# Patient Record
Sex: Female | Born: 1980 | Race: Black or African American | Hispanic: No | Marital: Single | Smoking: Former smoker
Health system: Southern US, Community
[De-identification: ages and names within clinical notes are randomized; demographics above are authoritative.]

## PROBLEM LIST (undated history)

## (undated) DIAGNOSIS — Z87442 Personal history of urinary calculi: Secondary | ICD-10-CM

## (undated) DIAGNOSIS — N83209 Unspecified ovarian cyst, unspecified side: Secondary | ICD-10-CM

## (undated) DIAGNOSIS — J45909 Unspecified asthma, uncomplicated: Secondary | ICD-10-CM

## (undated) DIAGNOSIS — N289 Disorder of kidney and ureter, unspecified: Secondary | ICD-10-CM

## (undated) DIAGNOSIS — M71022 Abscess of bursa, left elbow: Secondary | ICD-10-CM

## (undated) DIAGNOSIS — G839 Paralytic syndrome, unspecified: Secondary | ICD-10-CM

## (undated) DIAGNOSIS — D649 Anemia, unspecified: Secondary | ICD-10-CM

## (undated) DIAGNOSIS — M1990 Unspecified osteoarthritis, unspecified site: Secondary | ICD-10-CM

## (undated) DIAGNOSIS — F209 Schizophrenia, unspecified: Secondary | ICD-10-CM

## (undated) DIAGNOSIS — I10 Essential (primary) hypertension: Secondary | ICD-10-CM

## (undated) DIAGNOSIS — F32A Depression, unspecified: Secondary | ICD-10-CM

## (undated) DIAGNOSIS — Z9289 Personal history of other medical treatment: Secondary | ICD-10-CM

## (undated) DIAGNOSIS — F329 Major depressive disorder, single episode, unspecified: Secondary | ICD-10-CM

## (undated) DIAGNOSIS — E119 Type 2 diabetes mellitus without complications: Secondary | ICD-10-CM

## (undated) DIAGNOSIS — D571 Sickle-cell disease without crisis: Secondary | ICD-10-CM

## (undated) HISTORY — PX: DILATION AND CURETTAGE OF UTERUS: SHX78

## (undated) HISTORY — PX: CARPAL TUNNEL RELEASE: SHX101

---
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
## 2011-12-01 HISTORY — PX: APPENDECTOMY: SHX54

## 2013-06-30 DIAGNOSIS — M71022 Abscess of bursa, left elbow: Secondary | ICD-10-CM

## 2013-06-30 HISTORY — DX: Abscess of bursa, left elbow: M71.022

## 2013-09-05 ENCOUNTER — Encounter (HOSPITAL_COMMUNITY): Payer: Self-pay

## 2013-09-05 ENCOUNTER — Emergency Department (HOSPITAL_COMMUNITY): Payer: Medicaid Other

## 2013-09-05 ENCOUNTER — Emergency Department (HOSPITAL_COMMUNITY)
Admission: EM | Admit: 2013-09-05 | Discharge: 2013-09-05 | Disposition: A | Discharge status: Home / Self Care | Payer: Medicaid Other | Attending: Emergency Medicine | Admitting: Emergency Medicine

## 2013-09-05 DIAGNOSIS — Z3202 Encounter for pregnancy test, result negative: Secondary | ICD-10-CM | POA: Insufficient documentation

## 2013-09-05 DIAGNOSIS — E119 Type 2 diabetes mellitus without complications: Secondary | ICD-10-CM | POA: Insufficient documentation

## 2013-09-05 DIAGNOSIS — Z79899 Other long term (current) drug therapy: Secondary | ICD-10-CM | POA: Insufficient documentation

## 2013-09-05 DIAGNOSIS — Z794 Long term (current) use of insulin: Secondary | ICD-10-CM | POA: Insufficient documentation

## 2013-09-05 DIAGNOSIS — I10 Essential (primary) hypertension: Secondary | ICD-10-CM | POA: Insufficient documentation

## 2013-09-05 DIAGNOSIS — D5700 Hb-SS disease with crisis, unspecified: Secondary | ICD-10-CM | POA: Insufficient documentation

## 2013-09-05 DIAGNOSIS — Z880 Allergy status to penicillin: Secondary | ICD-10-CM | POA: Insufficient documentation

## 2013-09-05 HISTORY — DX: Essential (primary) hypertension: I10

## 2013-09-05 LAB — HEPATIC FUNCTION PANEL
ALT: 11 U/L (ref 0–35)
AST: 12 U/L (ref 0–37)
Albumin: 2.6 g/dL — ABNORMAL LOW (ref 3.5–5.2)
Alkaline Phosphatase: 86 U/L (ref 39–117)
Bilirubin, Direct: <0.1 mg/dL (ref 0.0–0.3)
Total Bilirubin: 0.3 mg/dL (ref 0.3–1.2)
Total Protein: 7.3 g/dL (ref 6.0–8.3)

## 2013-09-05 LAB — CBC WITH DIFFERENTIAL/PLATELET
Basophils Absolute: 0 10*3/uL (ref 0.0–0.1)
Basophils Relative: 0 % (ref 0–1)
Eosinophils Absolute: 0.1 10*3/uL (ref 0.0–0.7)
Eosinophils Relative: 2 % (ref 0–5)
HCT: 35.5 % — ABNORMAL LOW (ref 36.0–46.0)
Hemoglobin: 11.5 g/dL — ABNORMAL LOW (ref 12.0–15.0)
Lymphocytes Relative: 45 % (ref 12–46)
Lymphs Abs: 3 10*3/uL (ref 0.7–4.0)
MCH: 22.9 pg — ABNORMAL LOW (ref 26.0–34.0)
MCHC: 32.4 g/dL (ref 30.0–36.0)
MCV: 70.6 fL — ABNORMAL LOW (ref 78.0–100.0)
Monocytes Absolute: 0.3 10*3/uL (ref 0.1–1.0)
Monocytes Relative: 5 % (ref 3–12)
Neutro Abs: 3.2 10*3/uL (ref 1.7–7.7)
Neutrophils Relative %: 48 % (ref 43–77)
Platelets: 354 10*3/uL (ref 150–400)
RBC: 5.03 MIL/uL (ref 3.87–5.11)
RDW: 16.3 % — ABNORMAL HIGH (ref 11.5–15.5)
WBC: 6.7 10*3/uL (ref 4.0–10.5)

## 2013-09-05 LAB — BASIC METABOLIC PANEL
BUN: 8 mg/dL (ref 6–23)
CO2: 24 mEq/L (ref 19–32)
Calcium: 8.9 mg/dL (ref 8.4–10.5)
Chloride: 102 mEq/L (ref 96–112)
Creatinine, Ser: 0.56 mg/dL (ref 0.50–1.10)
GFR calc Af Amer: >90 mL/min (ref >90)
GFR calc non Af Amer: >90 mL/min (ref >90)
Glucose, Bld: 145 mg/dL — ABNORMAL HIGH (ref 70–99)
Potassium: 3.9 mEq/L (ref 3.5–5.1)
Sodium: 137 mEq/L (ref 135–145)

## 2013-09-05 LAB — RETICULOCYTES
RBC.: 5.03 MIL/uL (ref 3.87–5.11)
Retic Count, Absolute: 55.3 10*3/uL (ref 19.0–186.0)
Retic Ct Pct: 1.1 % (ref 0.4–3.1)

## 2013-09-05 LAB — URINALYSIS, ROUTINE W REFLEX MICROSCOPIC
Bilirubin Urine: NEGATIVE
Glucose, UA: NEGATIVE mg/dL
Ketones, ur: NEGATIVE mg/dL
Leukocytes, UA: NEGATIVE
Nitrite: NEGATIVE
Protein, ur: 100 mg/dL — AB
Specific Gravity, Urine: 1.016 (ref 1.005–1.030)
Urobilinogen, UA: 0.2 mg/dL (ref 0.0–1.0)
pH: 7 (ref 5.0–8.0)

## 2013-09-05 LAB — POCT PREGNANCY, URINE: Preg Test, Ur: NEGATIVE

## 2013-09-05 LAB — URINE MICROSCOPIC-ADD ON

## 2013-09-05 MED ORDER — ONDANSETRON 4 MG PO TBDP
4.0000 mg | ORAL_TABLET | Freq: Once | ORAL | Status: AC
  Administered 2013-09-05: 4 mg via ORAL
  Filled 2013-09-05: qty 1

## 2013-09-05 MED ORDER — HYDROMORPHONE HCL PF 2 MG/ML IJ SOLN
2.0000 mg | Freq: Once | INTRAMUSCULAR | Status: AC
  Administered 2013-09-05: 2 mg via INTRAVENOUS
  Filled 2013-09-05: qty 1

## 2013-09-05 MED ORDER — ONDANSETRON HCL 4 MG PO TABS: 4.0000 mg | ORAL_TABLET | Freq: Four times a day (QID) | ORAL | Status: DC

## 2013-09-05 MED ORDER — FAMOTIDINE IN NACL 20-0.9 MG/50ML-% IV SOLN
20.0000 mg | Freq: Once | INTRAVENOUS | Status: AC
  Administered 2013-09-05: 20 mg via INTRAVENOUS
  Filled 2013-09-05: qty 50

## 2013-09-05 MED ORDER — SODIUM CHLORIDE 0.9 % IV BOLUS (SEPSIS)
1000.0000 mL | Freq: Once | INTRAVENOUS | Status: AC
  Administered 2013-09-05: 1000 mL via INTRAVENOUS

## 2013-09-05 MED ORDER — MORPHINE SULFATE 4 MG/ML IJ SOLN
4.0000 mg | Freq: Once | INTRAMUSCULAR | Status: AC
  Administered 2013-09-05: 4 mg via INTRAVENOUS
  Filled 2013-09-05: qty 1

## 2013-09-05 MED ORDER — KETOROLAC TROMETHAMINE 30 MG/ML IJ SOLN
30.0000 mg | Freq: Once | INTRAMUSCULAR | Status: AC
  Administered 2013-09-05: 30 mg via INTRAVENOUS
  Filled 2013-09-05: qty 1

## 2013-09-05 MED ORDER — OXYCODONE-ACETAMINOPHEN 5-325 MG PO TABS: 1.0000 | ORAL_TABLET | Freq: Four times a day (QID) | ORAL | Status: DC | PRN

## 2013-09-05 MED ORDER — PROMETHAZINE HCL 25 MG/ML IJ SOLN
12.5000 mg | Freq: Once | INTRAMUSCULAR | Status: AC
  Administered 2013-09-05: 12.5 mg via INTRAVENOUS
  Filled 2013-09-05: qty 1

## 2013-09-05 NOTE — ED Notes (Signed)
Patient transported to X-ray 

## 2013-09-05 NOTE — ED Notes (Signed)
Pt alert and oriented. Pt placed on monitor. O2 via nasal cannula for comfort. O2 sats 99%

## 2013-09-05 NOTE — ED Notes (Signed)
Pt states she feels nauseous. Pt ambulatory to BR with steady gait.

## 2013-09-05 NOTE — ED Notes (Signed)
Pt given ginger ale. Pt sitting up talking on phone.

## 2013-09-05 NOTE — ED Provider Notes (Signed)
CSN: [id number]     Arrival date & time 09/05/13  1020 History   First MD Initiated Contact with Patient 09/05/13 1030     Chief Complaint  Patient presents with  . Emesis   (Consider location/radiation/quality/duration/timing/severity/associated sxs/prior Treatment) HPI  Cynthia Rosales is a 32 y.o. female with PMH significant for IDDM, HTN and sickle cell c/o extremity x4 and low back pain consistent with prior sickle cell pain. Pt denies fever, CP, cough, SOB, N/V, abd pain, HA, dysarthria,ataxia.   Pt moved her from [place] x4 weeks ago and has not established primary care. She was inpatient at [facility] in [place] x5 weeks ago .  Past Medical History  Diagnosis Date  . Diabetes mellitus without complication   . Hypertension   . Sickle cell disease    History reviewed. No pertinent past surgical history. No family history on file. History  Substance Use Topics  . Smoking status: Not on file  . Smokeless tobacco: Not on file  . Alcohol Use: Not on file   OB History   Grav Para Term Preterm Abortions TAB SAB Ect Mult Living                 Review of Systems 10 systems reviewed and found to be negative, except as noted in the HPI  Allergies  Amoxicillin  Home Medications   Current Outpatient Rx  Name  Route  Sig  Dispense  Refill  . insulin aspart (NOVOLOG) 100 UNIT/ML injection   Subcutaneous   Inject 10-15 Units into the skin 3 (three) times daily with meals. BS 100-150=10 units, if over 150=15 units         . insulin glargine (LANTUS) 100 UNIT/ML injection   Subcutaneous   Inject 40 Units into the skin at bedtime.         . metFORMIN (GLUCOPHAGE) 850 MG tablet   Oral   Take 850 mg by mouth 2 (two) times daily with a meal.         . Norelgestromin-Eth Estradiol (ORTHO EVRA TD)   Transdermal   Place 1 patch onto the skin every 7 (seven) weeks. Pt does every week for 3 weeks then has a off week         . oxyCODONE (OXY IR/ROXICODONE) 5 MG immediate  release tablet   Oral   Take 5-10 mg by mouth every 6 (six) hours as needed for pain.          BP 166/98  Pulse 78  Temp(Src) 98.2 F (36.8 C) (Oral)  Resp 18  SpO2 100% Physical Exam  Nursing note and vitals reviewed. Constitutional: She is oriented to person, place, and time. She appears well-developed and well-nourished. No distress.  Obese  HENT:  Head: Normocephalic.  No Jaundice or pale conjuctiva  Eyes: Conjunctivae and EOM are normal. Pupils are equal, round, and reactive to light.  Neck: Normal range of motion. Neck supple.  Cardiovascular: Normal rate, regular rhythm and intact distal pulses.   Pulmonary/Chest: Effort normal and breath sounds normal. No stridor. No respiratory distress. She has no wheezes. She has no rales. She exhibits no tenderness.  Abdominal: Soft. Bowel sounds are normal. She exhibits no distension and no mass. There is no tenderness. There is no rebound and no guarding.  Musculoskeletal: Normal range of motion. She exhibits no edema.  No calf asymmetry, superficial collaterals, palpable cords, edema, Homans sign negative bilaterally.    Neurological: She is alert and oriented to person, place, and  time.  Psychiatric: She has a normal mood and affect.    ED Course  Procedures (including critical care time) Labs Review Labs Reviewed  CBC WITH DIFFERENTIAL - Abnormal; Notable for the following:    Hemoglobin 11.5 (*)    HCT 35.5 (*)    MCV 70.6 (*)    MCH 22.9 (*)    RDW 16.3 (*)    All other components within normal limits  BASIC METABOLIC PANEL - Abnormal; Notable for the following:    Glucose, Bld 145 (*)    All other components within normal limits  HEPATIC FUNCTION PANEL - Abnormal; Notable for the following:    Albumin 2.6 (*)    All other components within normal limits  URINALYSIS, ROUTINE W REFLEX MICROSCOPIC - Abnormal; Notable for the following:    Hgb urine dipstick TRACE (*)    Protein, ur 100 (*)    All other components  within normal limits  URINE MICROSCOPIC-ADD ON - Abnormal; Notable for the following:    Squamous Epithelial / LPF FEW (*)    All other components within normal limits  RETICULOCYTES  HEMOGLOBINOPATHY EVALUATION  POCT PREGNANCY, URINE   Imaging Review Dg Abd Acute W/chest  09/05/2013   CLINICAL DATA:  Vomiting and diarrhea.  EXAM: ACUTE ABDOMEN SERIES (ABDOMEN 2 VIEW & CHEST 1 VIEW)  COMPARISON:  None.  FINDINGS: There is no evidence of dilated bowel loops or free intraperitoneal air. No radiopaque calculi or other significant radiographic abnormality is seen. Heart size and mediastinal contours are within normal limits. Both lungs are clear.  IMPRESSION: Negative abdominal radiographs.  No acute cardiopulmonary disease.   Electronically Signed   By: Geanie Cooley M.D.   On: 09/05/2013 11:12    MDM   1. Sickle cell anemia with pain     Filed Vitals:   09/05/13 1021 09/05/13 1051 09/05/13 1450 09/05/13 1553  BP:  166/98  169/104  Pulse:  78 82   Temp:  98.2 F (36.8 C)  97.5 F (36.4 C)  TempSrc:  Oral  Oral  Resp:  18 16 18   SpO2: 100% 100% 100% 98%     Cynthia Rosales is a 32 y.o. female with IDDM, HTN and sickle cell disease c/o pain typical of prior sickle cell pain crises. No systemic symptoms. LSCTA. Pt is new to the region, HgB electophoresis pending. Pt has normal retics and T Bili with H and H of 11.5/35.5. Pain is poorly controlled with morphine. Dilaudid and Toradol given.   Pt is not tolerating PO, will order pepcid and phenergan. Case signed out to PA Albert at shift change. Plan is to PO challenge and dc with referral to sickle cell clinic.   Medications  promethazine (PHENERGAN) injection 12.5 mg (not administered)  sodium chloride 0.9 % bolus 1,000 mL (not administered)  famotidine (PEPCID) IVPB 20 mg (not administered)  sodium chloride 0.9 % bolus 1,000 mL (1,000 mLs Intravenous New Bag/Given 09/05/13 1235)  ondansetron (ZOFRAN-ODT) disintegrating tablet 4 mg (4 mg  Oral Given 09/05/13 1235)  morphine 4 MG/ML injection 4 mg (4 mg Intravenous Given 09/05/13 1235)  HYDROmorphone (DILAUDID) injection 2 mg (2 mg Intravenous Given 09/05/13 1435)  morphine 4 MG/ML injection 4 mg (4 mg Intravenous Given 09/05/13 1435)  ketorolac (TORADOL) 30 MG/ML injection 30 mg (30 mg Intravenous Given 09/05/13 1435)   Note: Portions of this report may have been transcribed using voice recognition software. Every effort was made to ensure accuracy; however, inadvertent computerized transcription errors  may be present  ADDENDUM: Electrophoresis inconsistent with sickle cell. Flag placed on Pt's chart    Wynetta Emery, PA-C 09/10/13 [phone]

## 2013-09-05 NOTE — ED Notes (Signed)
Pt moved here from [place]. Has Dx Sickle Cell does not have a local MD. Pt Complains of emesis x 2 day along with back and extremity pain she states feels like Sickle Cell Pain.l

## 2013-09-05 NOTE — ED Notes (Signed)
Bed: WA25 Expected date:  Expected time:  Means of arrival:  Comments: Sickle cell  

## 2013-09-05 NOTE — ED Provider Notes (Signed)
Care assumed from [place], PA-C at shift change. Patient with apparent sickle-cell disease, never seen before in this ED. Labs appear unremarkable. Reporting "typical" sickle-cell pain. Hemoglobin electrophoresis pending to check for actual sickle cell. Actively vomiting in ED, phenergan just given. Plan to discharge home after she tolerates PO. Will give referral to sickle-cell clinic. 4:28 PM Patient tolerated PO fluids. She is requesting more to drink. She feels well not to be discharge. Discharged home. Resources for her wellness clinic and sickle-cell clinic given. Return precautions discussed. Understanding of plan and is agreeable.  Trevor Mace, PA-C 09/05/13 [phone]

## 2013-09-05 NOTE — Progress Notes (Signed)
Patient reports she just moved here from [facility].  Patient confirms she does not have a physician.  [facility] provided patient with a list of pcps who accept Medicaid insurance in [place].  EDCM also provided patient  the address and phone number of [facility].  Patient thankful for resources.

## 2013-09-05 NOTE — ED Provider Notes (Signed)
Medical screening examination/treatment/procedure(s) were performed by non-physician practitioner and as supervising physician I was immediately available for consultation/collaboration.  Kiyoko Mcguirt T Aubriana Ravelo, MD 09/05/13 2331 

## 2013-09-07 LAB — HEMOGLOBINOPATHY EVALUATION
Hemoglobin Other: 0 %
Hgb A2 Quant: 2.3 % (ref 2.2–3.2)
Hgb A: 97.7 % (ref 96.8–97.8)
Hgb F Quant: 0 % (ref 0.0–2.0)
Hgb S Quant: 0 %

## 2013-09-12 NOTE — ED Provider Notes (Signed)
Medical screening examination/treatment/procedure(s) were performed by non-physician practitioner and as supervising physician I was immediately available for consultation/collaboration.   Candyce Churn, MD 09/12/13 1455

## 2014-01-03 ENCOUNTER — Emergency Department (HOSPITAL_COMMUNITY)
Admission: EM | Admit: 2014-01-03 | Discharge: 2014-01-04 | Disposition: A | Discharge status: Home / Self Care | Payer: Medicaid Other | Attending: Emergency Medicine | Admitting: Emergency Medicine

## 2014-01-03 ENCOUNTER — Encounter (HOSPITAL_COMMUNITY): Payer: Self-pay | Admitting: Emergency Medicine

## 2014-01-03 DIAGNOSIS — I10 Essential (primary) hypertension: Secondary | ICD-10-CM | POA: Insufficient documentation

## 2014-01-03 DIAGNOSIS — B9689 Other specified bacterial agents as the cause of diseases classified elsewhere: Secondary | ICD-10-CM | POA: Insufficient documentation

## 2014-01-03 DIAGNOSIS — N760 Acute vaginitis: Secondary | ICD-10-CM | POA: Insufficient documentation

## 2014-01-03 DIAGNOSIS — Z3202 Encounter for pregnancy test, result negative: Secondary | ICD-10-CM | POA: Insufficient documentation

## 2014-01-03 DIAGNOSIS — Z794 Long term (current) use of insulin: Secondary | ICD-10-CM | POA: Insufficient documentation

## 2014-01-03 DIAGNOSIS — Z79899 Other long term (current) drug therapy: Secondary | ICD-10-CM | POA: Insufficient documentation

## 2014-01-03 DIAGNOSIS — R109 Unspecified abdominal pain: Secondary | ICD-10-CM | POA: Insufficient documentation

## 2014-01-03 DIAGNOSIS — A499 Bacterial infection, unspecified: Secondary | ICD-10-CM | POA: Insufficient documentation

## 2014-01-03 DIAGNOSIS — Z880 Allergy status to penicillin: Secondary | ICD-10-CM | POA: Insufficient documentation

## 2014-01-03 DIAGNOSIS — O039 Complete or unspecified spontaneous abortion without complication: Secondary | ICD-10-CM

## 2014-01-03 DIAGNOSIS — E119 Type 2 diabetes mellitus without complications: Secondary | ICD-10-CM | POA: Insufficient documentation

## 2014-01-03 DIAGNOSIS — Z862 Personal history of diseases of the blood and blood-forming organs and certain disorders involving the immune mechanism: Secondary | ICD-10-CM | POA: Insufficient documentation

## 2014-01-03 DIAGNOSIS — R739 Hyperglycemia, unspecified: Secondary | ICD-10-CM

## 2014-01-03 LAB — URINE MICROSCOPIC-ADD ON

## 2014-01-03 LAB — URINALYSIS, ROUTINE W REFLEX MICROSCOPIC
Bilirubin Urine: NEGATIVE
Glucose, UA: >1000 mg/dL — AB
Ketones, ur: NEGATIVE mg/dL
Leukocytes, UA: NEGATIVE
Nitrite: NEGATIVE
Protein, ur: 100 mg/dL — AB
Specific Gravity, Urine: 1.03 (ref 1.005–1.030)
Urobilinogen, UA: 0.2 mg/dL (ref 0.0–1.0)
pH: 5.5 (ref 5.0–8.0)

## 2014-01-03 LAB — ABO/RH: ABO/RH(D): O POS

## 2014-01-03 LAB — CBC
HCT: 33.6 % — ABNORMAL LOW (ref 36.0–46.0)
Hemoglobin: 11.3 g/dL — ABNORMAL LOW (ref 12.0–15.0)
MCH: 23.7 pg — ABNORMAL LOW (ref 26.0–34.0)
MCHC: 33.6 g/dL (ref 30.0–36.0)
MCV: 70.6 fL — ABNORMAL LOW (ref 78.0–100.0)
Platelets: 318 10*3/uL (ref 150–400)
RBC: 4.76 MIL/uL (ref 3.87–5.11)
RDW: 17.3 % — ABNORMAL HIGH (ref 11.5–15.5)
WBC: 12.5 10*3/uL — ABNORMAL HIGH (ref 4.0–10.5)

## 2014-01-03 LAB — BASIC METABOLIC PANEL
BUN: 13 mg/dL (ref 6–23)
CO2: 23 mEq/L (ref 19–32)
Calcium: 8.6 mg/dL (ref 8.4–10.5)
Chloride: 101 mEq/L (ref 96–112)
Creatinine, Ser: 0.7 mg/dL (ref 0.50–1.10)
GFR calc Af Amer: >90 mL/min (ref >90)
GFR calc non Af Amer: >90 mL/min (ref >90)
Glucose, Bld: 336 mg/dL — ABNORMAL HIGH (ref 70–99)
Potassium: 4.1 mEq/L (ref 3.7–5.3)
Sodium: 138 mEq/L (ref 137–147)

## 2014-01-03 LAB — HCG, QUANTITATIVE, PREGNANCY: hCG, Beta Chain, Quant, S: <1 m[IU]/mL (ref <5)

## 2014-01-03 LAB — PREGNANCY, URINE: Preg Test, Ur: NEGATIVE

## 2014-01-03 MED ORDER — ONDANSETRON HCL 4 MG/2ML IJ SOLN
4.0000 mg | Freq: Once | INTRAMUSCULAR | Status: AC
  Administered 2014-01-04: 4 mg via INTRAVENOUS
  Filled 2014-01-03: qty 2

## 2014-01-03 MED ORDER — SODIUM CHLORIDE 0.9 % IV BOLUS (SEPSIS)
500.0000 mL | Freq: Once | INTRAVENOUS | Status: AC
  Administered 2014-01-04: 500 mL via INTRAVENOUS

## 2014-01-03 MED ORDER — KETOROLAC TROMETHAMINE 30 MG/ML IJ SOLN: 30.0000 mg | Freq: Once | INTRAMUSCULAR | Status: DC

## 2014-01-03 MED ORDER — ACETAMINOPHEN 325 MG PO TABS
650.0000 mg | ORAL_TABLET | Freq: Once | ORAL | Status: AC
  Administered 2014-01-03: 650 mg via ORAL
  Filled 2014-01-03: qty 2

## 2014-01-03 MED ORDER — MORPHINE SULFATE 4 MG/ML IJ SOLN
4.0000 mg | Freq: Once | INTRAMUSCULAR | Status: AC
  Administered 2014-01-04: 4 mg via INTRAVENOUS
  Filled 2014-01-03: qty 1

## 2014-01-03 MED ORDER — ONDANSETRON HCL 4 MG/2ML IJ SOLN
4.0000 mg | Freq: Once | INTRAMUSCULAR | Status: AC
  Administered 2014-01-03: 4 mg via INTRAVENOUS
  Filled 2014-01-03: qty 2

## 2014-01-03 NOTE — ED Notes (Signed)
Pt reports lower abd pain and back pain described as excruciating for a few days; also reports bloody discharge- states is about 5 weeks preg; LMP 12/23; reports n/v

## 2014-01-03 NOTE — ED Provider Notes (Signed)
CSN: [id number]     Arrival date & time 01/03/14  1953 History   First MD Initiated Contact with Patient 01/03/14 2158     Chief Complaint  Patient presents with  . Vaginal Bleeding  . Abdominal Pain   (Consider location/radiation/quality/duration/timing/severity/associated sxs/prior Treatment) HPI Comments: Cynthia Rosales is a 33 year-old female with a past medical history of G5P3A1 DM, HTN, Sickle cell disease, presenting the Emergency Department with a chief complaint of lower abdominal pain for 4 days.  The patient reports lower abdominal cramping pain for 4 days. She reports spotting today.  She reports spotting on tissue.  The patient reports taking a urine pregnancy test in Dr. Fransico Setters office that was positive.  She also reports taking multiple home pregnancy tests that have been positive as well.   LMP 11/21/2013   Patient is a 33 y.o. female presenting with vaginal bleeding and abdominal pain. The history is provided by the patient and medical records. No language interpreter was used.  Vaginal Bleeding Associated symptoms: abdominal pain   Abdominal Pain Associated symptoms: vaginal bleeding     Past Medical History  Diagnosis Date  . Diabetes mellitus without complication   . Hypertension   . Sickle cell disease    Past Surgical History  Procedure Laterality Date  . Cesarean section    . Appendectomy    . Breast reduction surgery    . Dilation and curettage of uterus     History reviewed. No pertinent family history. History  Substance Use Topics  . Smoking status: Never Smoker   . Smokeless tobacco: Not on file  . Alcohol Use: No   OB History   Grav Para Term Preterm Abortions TAB SAB Ect Mult Living   1              Review of Systems  Gastrointestinal: Positive for abdominal pain.  Genitourinary: Positive for vaginal bleeding.    Allergies  Amoxicillin  Home Medications   Current Outpatient Rx  Name  Route  Sig  Dispense  Refill  . insulin aspart  (NOVOLOG) 100 UNIT/ML injection   Subcutaneous   Inject 10-15 Units into the skin 3 (three) times daily with meals. BS 100-150=10 units, if over 150=15 units         . insulin glargine (LANTUS) 100 UNIT/ML injection   Subcutaneous   Inject 40 Units into the skin at bedtime.         . metFORMIN (GLUCOPHAGE) 850 MG tablet   Oral   Take 850 mg by mouth 2 (two) times daily with a meal.          BP 142/104  Pulse 96  Temp(Src) 97.8 F (36.6 C) (Oral)  Resp 20  SpO2 100%  LMP 11/21/2013 Physical Exam  Nursing note and vitals reviewed. Constitutional: She is oriented to person, place, and time. She appears well-developed and well-nourished. No distress.  Exam limited by patient's body habitus.    HENT:  Head: Normocephalic and atraumatic.  Eyes: EOM are normal. Pupils are equal, round, and reactive to light. No scleral icterus.  Neck: Normal range of motion. Neck supple.  Cardiovascular: Regular rhythm.   Pulmonary/Chest: Effort normal and breath sounds normal. She has no wheezes. She has no rales. She exhibits no tenderness.  Abdominal: Soft. Bowel sounds are normal. There is tenderness in the suprapubic area. There is no rebound, no guarding, no CVA tenderness, no tenderness at McBurney's point and negative Murphy's sign.  Genitourinary: Uterus is tender. Right  adnexum displays tenderness. Left adnexum displays tenderness.  Unable to visualize the cervex. Moderate amount of dark blood and small clots and questionable tissue in vaginal vault.   Neurological: She is alert and oriented to person, place, and time.  Skin: Skin is warm and dry. No rash noted.  Psychiatric: She has a normal mood and affect. Her behavior is normal.    ED Course  Procedures (including critical care time) Labs Review Labs Reviewed  WET PREP, GENITAL - Abnormal; Notable for the following:    Clue Cells Wet Prep HPF POC FEW (*)    All other components within normal limits  CBC - Abnormal; Notable  for the following:    WBC 12.5 (*)    Hemoglobin 11.3 (*)    HCT 33.6 (*)    MCV 70.6 (*)    MCH 23.7 (*)    RDW 17.3 (*)    All other components within normal limits  URINALYSIS, ROUTINE W REFLEX MICROSCOPIC - Abnormal; Notable for the following:    Glucose, UA >1000 (*)    Hgb urine dipstick MODERATE (*)    Protein, ur 100 (*)    All other components within normal limits  BASIC METABOLIC PANEL - Abnormal; Notable for the following:    Glucose, Bld 336 (*)    All other components within normal limits  URINE MICROSCOPIC-ADD ON - Abnormal; Notable for the following:    Squamous Epithelial / LPF FEW (*)    All other components within normal limits  GC/CHLAMYDIA PROBE AMP  HCG, QUANTITATIVE, PREGNANCY  PREGNANCY, URINE  ABO/RH   Imaging Review No results found.  EKG Interpretation   None       MDM   1. Miscarriage   2. BV (bacterial vaginosis)   3. Hyperglycemia    Pt reports multiple positive pregnancy tests, with 4 days of abdominal pain and vaginal bleeding.  hcg quant <1, Urine Hcg, negative.  Rh +, will not administer Rhogam.  Pelvic exam, I was unable to visualize the cervex despite multiple attempts, due to body habitus.  Moderate amount of dark blood and small clots in vaginal vault. Wet prep shows clue cells- will treat for BV.  Discussed lab results and treatment plan with the patient which includes a follow up visit to Dr. Ladon Applebaum. Return precautions given. Reports understanding and no other concerns at this time.  Patient is stable for discharge at this time.  Meds given in ED:  Medications  ondansetron (ZOFRAN) injection 4 mg (4 mg Intravenous Given 01/03/14 2302)  acetaminophen (TYLENOL) tablet 650 mg (650 mg Oral Given 01/03/14 2301)  morphine 4 MG/ML injection 4 mg (4 mg Intravenous Given 01/04/14 0031)  ondansetron (ZOFRAN) injection 4 mg (4 mg Intravenous Given 01/04/14 0031)  sodium chloride 0.9 % bolus 500 mL (0 mLs Intravenous Stopped 01/04/14 0115)   ketorolac (TORADOL) 30 MG/ML injection 30 mg (30 mg Intravenous Given 01/04/14 0113)  HYDROmorphone (DILAUDID) injection 0.5 mg (0.5 mg Intravenous Given 01/04/14 0141)  promethazine (PHENERGAN) injection 25 mg (25 mg Intravenous Given 01/04/14 0141)    Discharge Medication List as of 01/04/2014  1:39 AM    oxyCODONE-acetaminophen (PERCOCET/ROXICET) 5-325 MG per tablet ondansetron (ZOFRAN ODT) 4 MG disintegrating tablet metroNIDAZOLE (FLAGYL) 500 MG tablet promethazine (PHENERGAN) 25 MG suppository          Lorrine Kin, PA-C 01/05/14 1214

## 2014-01-04 LAB — GC/CHLAMYDIA PROBE AMP
CT Probe RNA: NEGATIVE
GC Probe RNA: NEGATIVE

## 2014-01-04 LAB — WET PREP, GENITAL
Trich, Wet Prep: NONE SEEN
WBC, Wet Prep HPF POC: NONE SEEN
Yeast Wet Prep HPF POC: NONE SEEN

## 2014-01-04 MED ORDER — ONDANSETRON 4 MG PO TBDP: 4.0000 mg | ORAL_TABLET | Freq: Three times a day (TID) | ORAL | Status: DC | PRN

## 2014-01-04 MED ORDER — METRONIDAZOLE 500 MG PO TABS: 500.0000 mg | ORAL_TABLET | Freq: Two times a day (BID) | ORAL | Status: DC

## 2014-01-04 MED ORDER — OXYCODONE-ACETAMINOPHEN 5-325 MG PO TABS: 1.0000 | ORAL_TABLET | ORAL | Status: DC | PRN

## 2014-01-04 MED ORDER — HYDROMORPHONE HCL PF 1 MG/ML IJ SOLN
0.5000 mg | Freq: Once | INTRAMUSCULAR | Status: AC
  Administered 2014-01-04: 0.5 mg via INTRAVENOUS
  Filled 2014-01-04: qty 1

## 2014-01-04 MED ORDER — PROMETHAZINE HCL 25 MG/ML IJ SOLN
25.0000 mg | Freq: Once | INTRAMUSCULAR | Status: AC
  Administered 2014-01-04: 25 mg via INTRAVENOUS
  Filled 2014-01-04: qty 1

## 2014-01-04 MED ORDER — PROMETHAZINE HCL 25 MG RE SUPP: 25.0000 mg | Freq: Four times a day (QID) | RECTAL | Status: DC | PRN

## 2014-01-04 MED ORDER — KETOROLAC TROMETHAMINE 30 MG/ML IJ SOLN
30.0000 mg | Freq: Once | INTRAMUSCULAR | Status: AC
  Administered 2014-01-04: 30 mg via INTRAVENOUS
  Filled 2014-01-04: qty 1

## 2014-01-04 NOTE — Discharge Instructions (Signed)
Call for a follow up appointment with Dr. Criss Rosales, your OB/GYN. Call for a follow up appointment with a [facility] provider for further evaluation of your blood glucose. Return if Symptoms worsen.   Take medication as prescribed.

## 2014-01-08 NOTE — ED Provider Notes (Signed)
Medical screening examination/treatment/procedure(s) were performed by non-physician practitioner and as supervising physician I was immediately available for consultation/collaboration.  EKG Interpretation   None        Jasper Riling. Alvino Chapel, MD 01/08/14 2045

## 2014-03-27 ENCOUNTER — Emergency Department (HOSPITAL_COMMUNITY): Payer: Medicaid Other

## 2014-03-27 ENCOUNTER — Encounter (HOSPITAL_COMMUNITY): Payer: Self-pay | Admitting: Emergency Medicine

## 2014-03-27 ENCOUNTER — Inpatient Hospital Stay (HOSPITAL_COMMUNITY)
Admission: EM | Admit: 2014-03-27 | Discharge: 2014-03-29 | DRG: 694 | Disposition: A | Discharge status: Home / Self Care | Payer: Medicaid Other | Attending: Internal Medicine | Admitting: Internal Medicine

## 2014-03-27 DIAGNOSIS — Z881 Allergy status to other antibiotic agents status: Secondary | ICD-10-CM

## 2014-03-27 DIAGNOSIS — N201 Calculus of ureter: Principal | ICD-10-CM | POA: Diagnosis present

## 2014-03-27 DIAGNOSIS — N83209 Unspecified ovarian cyst, unspecified side: Secondary | ICD-10-CM | POA: Diagnosis present

## 2014-03-27 DIAGNOSIS — E119 Type 2 diabetes mellitus without complications: Secondary | ICD-10-CM | POA: Diagnosis present

## 2014-03-27 DIAGNOSIS — R109 Unspecified abdominal pain: Secondary | ICD-10-CM

## 2014-03-27 DIAGNOSIS — Z9089 Acquired absence of other organs: Secondary | ICD-10-CM

## 2014-03-27 DIAGNOSIS — D571 Sickle-cell disease without crisis: Secondary | ICD-10-CM | POA: Diagnosis present

## 2014-03-27 DIAGNOSIS — N23 Unspecified renal colic: Secondary | ICD-10-CM

## 2014-03-27 DIAGNOSIS — F3289 Other specified depressive episodes: Secondary | ICD-10-CM | POA: Diagnosis present

## 2014-03-27 DIAGNOSIS — J45909 Unspecified asthma, uncomplicated: Secondary | ICD-10-CM | POA: Diagnosis present

## 2014-03-27 DIAGNOSIS — Z794 Long term (current) use of insulin: Secondary | ICD-10-CM

## 2014-03-27 DIAGNOSIS — Z79899 Other long term (current) drug therapy: Secondary | ICD-10-CM

## 2014-03-27 DIAGNOSIS — R1110 Vomiting, unspecified: Secondary | ICD-10-CM | POA: Diagnosis present

## 2014-03-27 DIAGNOSIS — R52 Pain, unspecified: Secondary | ICD-10-CM

## 2014-03-27 DIAGNOSIS — I10 Essential (primary) hypertension: Secondary | ICD-10-CM | POA: Diagnosis present

## 2014-03-27 DIAGNOSIS — N200 Calculus of kidney: Secondary | ICD-10-CM | POA: Diagnosis present

## 2014-03-27 DIAGNOSIS — F329 Major depressive disorder, single episode, unspecified: Secondary | ICD-10-CM | POA: Diagnosis present

## 2014-03-27 DIAGNOSIS — Z6841 Body Mass Index (BMI) 40.0 and over, adult: Secondary | ICD-10-CM

## 2014-03-27 DIAGNOSIS — E860 Dehydration: Secondary | ICD-10-CM | POA: Diagnosis present

## 2014-03-27 HISTORY — DX: Type 2 diabetes mellitus without complications: E11.9

## 2014-03-27 HISTORY — DX: Major depressive disorder, single episode, unspecified: F32.9

## 2014-03-27 HISTORY — DX: Unspecified asthma, uncomplicated: J45.909

## 2014-03-27 HISTORY — DX: Depression, unspecified: F32.A

## 2014-03-27 HISTORY — DX: Personal history of other medical treatment: Z92.89

## 2014-03-27 HISTORY — DX: Anemia, unspecified: D64.9

## 2014-03-27 LAB — COMPREHENSIVE METABOLIC PANEL
ALT: 12 U/L (ref 0–35)
AST: 13 U/L (ref 0–37)
Albumin: 2.9 g/dL — ABNORMAL LOW (ref 3.5–5.2)
Alkaline Phosphatase: 100 U/L (ref 39–117)
BUN: 12 mg/dL (ref 6–23)
CO2: 23 mEq/L (ref 19–32)
Calcium: 9.3 mg/dL (ref 8.4–10.5)
Chloride: 102 mEq/L (ref 96–112)
Creatinine, Ser: 0.82 mg/dL (ref 0.50–1.10)
GFR calc Af Amer: >90 mL/min (ref >90)
GFR calc non Af Amer: >90 mL/min (ref >90)
Glucose, Bld: 194 mg/dL — ABNORMAL HIGH (ref 70–99)
Potassium: 4.1 mEq/L (ref 3.7–5.3)
Sodium: 138 mEq/L (ref 137–147)
Total Bilirubin: <0.2 mg/dL — ABNORMAL LOW (ref 0.3–1.2)
Total Protein: 7.8 g/dL (ref 6.0–8.3)

## 2014-03-27 LAB — URINALYSIS, ROUTINE W REFLEX MICROSCOPIC
Bilirubin Urine: NEGATIVE
Glucose, UA: 500 mg/dL — AB
Ketones, ur: NEGATIVE mg/dL
Leukocytes, UA: NEGATIVE
Nitrite: NEGATIVE
Protein, ur: 100 mg/dL — AB
Specific Gravity, Urine: 1.027 (ref 1.005–1.030)
Urobilinogen, UA: 0.2 mg/dL (ref 0.0–1.0)
pH: 5.5 (ref 5.0–8.0)

## 2014-03-27 LAB — RETICULOCYTES
RBC.: 4.81 MIL/uL (ref 3.87–5.11)
Retic Count, Absolute: 43.3 10*3/uL (ref 19.0–186.0)
Retic Ct Pct: 0.9 % (ref 0.4–3.1)

## 2014-03-27 LAB — GLUCOSE, CAPILLARY
Glucose-Capillary: 84 mg/dL (ref 70–99)
Glucose-Capillary: 168 mg/dL — ABNORMAL HIGH (ref 70–99)

## 2014-03-27 LAB — CBC WITH DIFFERENTIAL/PLATELET
Basophils Absolute: 0 10*3/uL (ref 0.0–0.1)
Basophils Relative: 0 % (ref 0–1)
Eosinophils Absolute: 0.2 10*3/uL (ref 0.0–0.7)
Eosinophils Relative: 2 % (ref 0–5)
HCT: 37 % (ref 36.0–46.0)
Hemoglobin: 12.2 g/dL (ref 12.0–15.0)
Lymphocytes Relative: 36 % (ref 12–46)
Lymphs Abs: 3.3 10*3/uL (ref 0.7–4.0)
MCH: 23.6 pg — ABNORMAL LOW (ref 26.0–34.0)
MCHC: 33 g/dL (ref 30.0–36.0)
MCV: 71.7 fL — ABNORMAL LOW (ref 78.0–100.0)
Monocytes Absolute: 0.3 10*3/uL (ref 0.1–1.0)
Monocytes Relative: 3 % (ref 3–12)
Neutro Abs: 5.4 10*3/uL (ref 1.7–7.7)
Neutrophils Relative %: 59 % (ref 43–77)
Platelets: 321 10*3/uL (ref 150–400)
RBC: 5.16 MIL/uL — ABNORMAL HIGH (ref 3.87–5.11)
RDW: 17.4 % — ABNORMAL HIGH (ref 11.5–15.5)
WBC: 9.2 10*3/uL (ref 4.0–10.5)

## 2014-03-27 LAB — WET PREP, GENITAL
Clue Cells Wet Prep HPF POC: NONE SEEN
Trich, Wet Prep: NONE SEEN
Yeast Wet Prep HPF POC: NONE SEEN

## 2014-03-27 LAB — CREATININE, SERUM
Creatinine, Ser: 0.79 mg/dL (ref 0.50–1.10)
GFR calc Af Amer: >90 mL/min (ref >90)
GFR calc non Af Amer: >90 mL/min (ref >90)

## 2014-03-27 LAB — I-STAT CG4 LACTIC ACID, ED: Lactic Acid, Venous: 1.79 mmol/L (ref 0.5–2.2)

## 2014-03-27 LAB — URINE MICROSCOPIC-ADD ON

## 2014-03-27 LAB — CBC
HCT: 34.5 % — ABNORMAL LOW (ref 36.0–46.0)
Hemoglobin: 11.3 g/dL — ABNORMAL LOW (ref 12.0–15.0)
MCH: 23.5 pg — ABNORMAL LOW (ref 26.0–34.0)
MCHC: 32.8 g/dL (ref 30.0–36.0)
MCV: 71.7 fL — ABNORMAL LOW (ref 78.0–100.0)
Platelets: 314 10*3/uL (ref 150–400)
RBC: 4.81 MIL/uL (ref 3.87–5.11)
RDW: 17.4 % — ABNORMAL HIGH (ref 11.5–15.5)
WBC: 11.6 10*3/uL — ABNORMAL HIGH (ref 4.0–10.5)

## 2014-03-27 LAB — LIPASE, BLOOD: Lipase: 27 U/L (ref 11–59)

## 2014-03-27 LAB — POC URINE PREG, ED: Preg Test, Ur: NEGATIVE

## 2014-03-27 LAB — LACTATE DEHYDROGENASE: LDH: 277 U/L — ABNORMAL HIGH (ref 94–250)

## 2014-03-27 MED ORDER — ENOXAPARIN SODIUM 40 MG/0.4ML ~~LOC~~ SOLN
40.0000 mg | SUBCUTANEOUS | Status: DC
  Administered 2014-03-27 – 2014-03-28 (×2): 40 mg via SUBCUTANEOUS
  Filled 2014-03-27 (×3): qty 0.4

## 2014-03-27 MED ORDER — IOHEXOL 300 MG/ML  SOLN
25.0000 mL | Freq: Once | INTRAMUSCULAR | Status: AC | PRN
  Administered 2014-03-27: 25 mL via ORAL

## 2014-03-27 MED ORDER — ONDANSETRON HCL 4 MG PO TABS: 4.0000 mg | ORAL_TABLET | Freq: Four times a day (QID) | ORAL | Status: DC | PRN

## 2014-03-27 MED ORDER — HYDROMORPHONE HCL PF 1 MG/ML IJ SOLN
1.0000 mg | Freq: Once | INTRAMUSCULAR | Status: AC
  Administered 2014-03-27: 1 mg via INTRAVENOUS
  Filled 2014-03-27: qty 1

## 2014-03-27 MED ORDER — PROMETHAZINE HCL 25 MG/ML IJ SOLN
25.0000 mg | Freq: Once | INTRAMUSCULAR | Status: AC
  Administered 2014-03-27: 25 mg via INTRAVENOUS
  Filled 2014-03-27: qty 1

## 2014-03-27 MED ORDER — ALUM & MAG HYDROXIDE-SIMETH 200-200-20 MG/5ML PO SUSP: 30.0000 mL | Freq: Four times a day (QID) | ORAL | Status: DC | PRN

## 2014-03-27 MED ORDER — DIPHENHYDRAMINE HCL 50 MG PO CAPS
50.0000 mg | ORAL_CAPSULE | ORAL | Status: AC
  Administered 2014-03-28: 50 mg via ORAL
  Filled 2014-03-27: qty 1
  Filled 2014-03-27: qty 2

## 2014-03-27 MED ORDER — HYDROMORPHONE HCL PF 1 MG/ML IJ SOLN
1.0000 mg | INTRAMUSCULAR | Status: AC | PRN
  Administered 2014-03-27: 1 mg via INTRAVENOUS
  Filled 2014-03-27 (×2): qty 1

## 2014-03-27 MED ORDER — ONDANSETRON HCL 4 MG/2ML IJ SOLN
4.0000 mg | Freq: Once | INTRAMUSCULAR | Status: AC
  Administered 2014-03-27: 4 mg via INTRAVENOUS
  Filled 2014-03-27: qty 2

## 2014-03-27 MED ORDER — INSULIN ASPART 100 UNIT/ML ~~LOC~~ SOLN
0.0000 [IU] | Freq: Three times a day (TID) | SUBCUTANEOUS | Status: DC
  Administered 2014-03-28 (×2): 2 [IU] via SUBCUTANEOUS
  Administered 2014-03-28: 1 [IU] via SUBCUTANEOUS
  Administered 2014-03-29: 2 [IU] via SUBCUTANEOUS
  Administered 2014-03-29: 1 [IU] via SUBCUTANEOUS
  Administered 2014-03-29: 2 [IU] via SUBCUTANEOUS

## 2014-03-27 MED ORDER — TAMSULOSIN HCL 0.4 MG PO CAPS
0.4000 mg | ORAL_CAPSULE | Freq: Every day | ORAL | Status: DC
  Administered 2014-03-27 – 2014-03-29 (×3): 0.4 mg via ORAL
  Filled 2014-03-27 (×3): qty 1

## 2014-03-27 MED ORDER — ONDANSETRON HCL 4 MG/2ML IJ SOLN
4.0000 mg | Freq: Four times a day (QID) | INTRAMUSCULAR | Status: DC | PRN
  Administered 2014-03-27 – 2014-03-29 (×2): 4 mg via INTRAVENOUS
  Filled 2014-03-27: qty 2

## 2014-03-27 MED ORDER — ACETAMINOPHEN 325 MG PO TABS: 650.0000 mg | ORAL_TABLET | Freq: Four times a day (QID) | ORAL | Status: DC | PRN

## 2014-03-27 MED ORDER — INSULIN GLARGINE 100 UNIT/ML ~~LOC~~ SOLN
20.0000 [IU] | Freq: Two times a day (BID) | SUBCUTANEOUS | Status: DC
  Administered 2014-03-27 – 2014-03-28 (×2): 20 [IU] via SUBCUTANEOUS
  Filled 2014-03-27 (×3): qty 0.2

## 2014-03-27 MED ORDER — ACETAMINOPHEN 650 MG RE SUPP: 650.0000 mg | Freq: Four times a day (QID) | RECTAL | Status: DC | PRN

## 2014-03-27 MED ORDER — DIPHENHYDRAMINE HCL 25 MG PO CAPS: 25.0000 mg | ORAL_CAPSULE | ORAL | Status: DC | PRN

## 2014-03-27 MED ORDER — ONDANSETRON 4 MG PO TBDP
8.0000 mg | ORAL_TABLET | Freq: Once | ORAL | Status: DC
  Filled 2014-03-27: qty 2

## 2014-03-27 MED ORDER — INSULIN ASPART 100 UNIT/ML ~~LOC~~ SOLN: 0.0000 [IU] | Freq: Every day | SUBCUTANEOUS | Status: DC

## 2014-03-27 MED ORDER — SODIUM CHLORIDE 0.9 % IV SOLN
INTRAVENOUS | Status: DC
  Administered 2014-03-27 – 2014-03-28 (×3): via INTRAVENOUS
  Administered 2014-03-28: 1000 mL via INTRAVENOUS
  Administered 2014-03-29: 07:00:00 via INTRAVENOUS

## 2014-03-27 MED ORDER — HYDROCODONE-ACETAMINOPHEN 5-325 MG PO TABS
1.0000 | ORAL_TABLET | ORAL | Status: DC | PRN
  Administered 2014-03-27 – 2014-03-28 (×4): 2 via ORAL
  Filled 2014-03-27 (×4): qty 2

## 2014-03-27 MED ORDER — IOHEXOL 300 MG/ML  SOLN
100.0000 mL | Freq: Once | INTRAMUSCULAR | Status: AC | PRN
  Administered 2014-03-27: 100 mL via INTRAVENOUS

## 2014-03-27 MED ORDER — TAMSULOSIN HCL 0.4 MG PO CAPS: 0.4000 mg | ORAL_CAPSULE | Freq: Every day | ORAL | Status: DC

## 2014-03-27 MED ORDER — HYDROMORPHONE HCL PF 1 MG/ML IJ SOLN
2.0000 mg | Freq: Once | INTRAMUSCULAR | Status: AC
  Administered 2014-03-27: 2 mg via INTRAVENOUS
  Filled 2014-03-27: qty 2

## 2014-03-27 MED ORDER — ONDANSETRON HCL 4 MG/2ML IJ SOLN
4.0000 mg | Freq: Three times a day (TID) | INTRAMUSCULAR | Status: AC | PRN
  Filled 2014-03-27: qty 2

## 2014-03-27 MED ORDER — MORPHINE SULFATE 4 MG/ML IJ SOLN
4.0000 mg | Freq: Once | INTRAMUSCULAR | Status: AC
  Administered 2014-03-27: 4 mg via INTRAVENOUS
  Filled 2014-03-27: qty 1

## 2014-03-27 MED ORDER — KETOROLAC TROMETHAMINE 30 MG/ML IJ SOLN
30.0000 mg | Freq: Once | INTRAMUSCULAR | Status: AC
  Administered 2014-03-27: 30 mg via INTRAVENOUS
  Filled 2014-03-27: qty 1

## 2014-03-27 MED ORDER — HYDROMORPHONE HCL PF 1 MG/ML IJ SOLN
1.0000 mg | INTRAMUSCULAR | Status: DC | PRN
  Administered 2014-03-28 (×2): 1 mg via INTRAVENOUS
  Filled 2014-03-27 (×2): qty 1

## 2014-03-27 NOTE — Progress Notes (Signed)
Call to ED for report. Spoke to [employer] at this time

## 2014-03-27 NOTE — ED Notes (Signed)
Pt arrives from home for eval of lower abd pain x4 days with radiation to both sides of lower back, pt reports dysuria and denies any vaginal discharge. Pt also reports n/v/d with fever, no fever noted presently. Pt also reports have a menstrual x2 this month. nad noted, skin warm and dry. Axo x4

## 2014-03-27 NOTE — ED Notes (Signed)
Pt back from [place],  Pt placed back on heart monitor.

## 2014-03-27 NOTE — Progress Notes (Signed)
Pt arrived to 5w16 from ED at this time, Pt instructed on  call light and surroundings, family at bedside. Denies nausea but states  upper abd pain is 6/10.

## 2014-03-27 NOTE — ED Notes (Signed)
Results of lactic acid called to primary nurse Andee Poles

## 2014-03-27 NOTE — H&P (Signed)
History and Physical       Hospital Admission Note Date: 03/27/2014  Patient name: Cynthia Rosales Medical record number: [id number] Date of birth: Feb 01, 1981 Age: 33 y.o. Gender: female  PCP: Elyn Peers, MD    Chief Complaint:  Abdominal and flank pain for last 5 days  HPI: Patient is a 33 year old female with history of insulin-dependent diabetes mellitus, hypertension, sickle cell disease presented to ER with diffuse abdominal pain, flank pain for last 5 days. Patient reports that she has sharp, intermittent abdominal pain and in flanks. She did take ibuprofen and Percocet without much relief. She also reports that she has intractable nausea, vomiting and has not been holding anything down and diarrhea. She denied any hematochezia melena or any hematemesis. Patient also reported that she was having chills, and the pain radiates to bilateral flanks. Patient also reports dysuria but no hematuria. CT abdomen and pelvis was done which showed probable tiny punctate nonobstructing stone in transit in the mid right ureter, no additional nephrolithiasis. Transabdominal and vaginal ultrasound complex cystic left ovarian mass could be benign or malignant, small cyst with single septation in the right ovary, likely benign, recommended followup pelvic ultrasound in 6 weeks. UA was negative for UTI  Review of Systems:  Constitutional: Denies fever, chills, diaphoresis, ++poor appetite and fatigue.  HEENT: Denies photophobia, eye pain, redness, hearing loss, ear pain, congestion, sore throat, rhinorrhea, sneezing, mouth sores, trouble swallowing, neck pain, neck stiffness and tinnitus.   Respiratory: Denies SOB, DOE, cough, chest tightness,  and wheezing.   Cardiovascular: Denies chest pain, palpitations and leg swelling.  Gastrointestinal: Please see history of present illness Genitourinary: Denies urgency, frequency, hematuria, flank pain and  difficulty urinating. + dysuria Musculoskeletal: Denies myalgias, back pain, joint swelling, arthralgias and gait problem.  Skin: Denies pallor, rash and wound.  Neurological: Denies dizziness, seizures, syncope, weakness, light-headedness, numbness and headaches.  Hematological: Denies adenopathy. Easy bruising, personal or family bleeding history  Psychiatric/Behavioral: Denies suicidal ideation, mood changes, confusion, nervousness, sleep disturbance and agitation  Past Medical History: Past Medical History  Diagnosis Date  . Diabetes mellitus without complication   . Hypertension   . Sickle cell disease    Past Surgical History  Procedure Laterality Date  . Cesarean section    . Appendectomy    . Breast reduction surgery    . Dilation and curettage of uterus      Medications: Prior to Admission medications   Medication Sig Start Date End Date Taking? Authorizing Provider  exenatide (BYETTA) 5 MCG/0.02ML SOPN injection Inject 5 mcg into the skin 2 (two) times daily with a meal.   Yes Historical Provider, MD  insulin aspart (NOVOLOG) 100 UNIT/ML injection Inject 20 Units into the skin 3 (three) times daily.    Yes Historical Provider, MD  insulin glargine (LANTUS) 100 UNIT/ML injection Inject 40 Units into the skin 2 (two) times daily.    Yes Historical Provider, MD  metFORMIN (GLUCOPHAGE) 1000 MG tablet Take 1,000 mg by mouth 2 (two) times daily.   Yes Historical Provider, MD    Allergies:   Allergies  Allergen Reactions  . Amoxicillin Hives and Rash    Social History:  reports that she has never smoked. She does not have any smokeless tobacco history on file. She reports that she does not drink alcohol or use illicit drugs.  Family History: No family history on file.  Physical Exam: Blood pressure 143/85, pulse 78, temperature 98.5 F (36.9 C), temperature source Oral, resp. rate 16,  height 5\' 3"  (1.6 m), weight 140.615 kg (310 lb), last menstrual period 03/09/2014,  SpO2 100.00%, unknown if currently breastfeeding. General: Alert, awake, oriented x3, uncomfortable and tearful due to pain HEENT: normocephalic, atraumatic, anicteric sclera, pink conjunctiva, pupils equal and reactive to light and accomodation, oropharynx clear Neck: supple, no masses or lymphadenopathy, no goiter, no bruits  Heart: Regular rate and rhythm, without murmurs, rubs or gallops. Lungs: Clear to auscultation bilaterally, no wheezing, rales or rhonchi. Abdomen: Soft, diffusely tender, bilateral flank pain, voluntary guarding positive bowel sounds Extremities: Morbidly obese, No clubbing, cyanosis or edema with positive pedal pulses. Neuro: Grossly intact, no focal neurological deficits, strength 5/5 upper and lower extremities bilaterally Psych: alert and oriented x 3, normal mood and affect Skin: no rashes or lesions, warm and dry   LABS on Admission:  Basic Metabolic Panel:  Recent Labs Lab 03/27/14 1227  NA 138  K 4.1  CL 102  CO2 23  GLUCOSE 194*  BUN 12  CREATININE 0.82  CALCIUM 9.3   Liver Function Tests:  Recent Labs Lab 03/27/14 1227  AST 13  ALT 12  ALKPHOS 100  BILITOT <0.2*  PROT 7.8  ALBUMIN 2.9*    Recent Labs Lab 03/27/14 1227  LIPASE 27   No results found for this basename: AMMONIA,  in the last 168 hours CBC:  Recent Labs Lab 03/27/14 1227  WBC 9.2  NEUTROABS 5.4  HGB 12.2  HCT 37.0  MCV 71.7*  PLT 321   Cardiac Enzymes: No results found for this basename: CKTOTAL, CKMB, CKMBINDEX, TROPONINI,  in the last 168 hours BNP: No components found with this basename: POCBNP,  CBG: No results found for this basename: GLUCAP,  in the last 168 hours   Radiological Exams on Admission: Dg Chest 2 View  03/27/2014   CLINICAL DATA:  right chest pain  EXAM: CHEST  2 VIEW  COMPARISON:  DG ABD ACUTE W/CHEST dated 09/05/2013  FINDINGS: The heart size and mediastinal contours are within normal limits. Both lungs are clear. The visualized  skeletal structures are unremarkable.  IMPRESSION: No active cardiopulmonary disease.   Electronically Signed   By: Margaree Mackintosh M.D.   On: 03/27/2014 13:59   US Transvaginal Non-ob  03/27/2014   CLINICAL DATA:  Pelvic pain.  EXAM: TRANSABDOMINAL AND TRANSVAGINAL ULTRASOUND OF PELVIS  TECHNIQUE: Both transabdominal and transvaginal ultrasound examinations of the pelvis were performed. Transabdominal technique was performed for global imaging of the pelvis including uterus, ovaries, adnexal regions, and pelvic cul-de-sac. It was necessary to proceed with endovaginal exam following the transabdominal exam to visualize the adnexa and ovaries.  COMPARISON:  None  FINDINGS: Uterus  Measurements: 9.8 x 4.2 x 4.8 cm. 1.0 x 0.7 x 0.9 cm cystic region in the lower uterine segment to. This could be fluid. A follow-up pelvic ultrasound is suggested to demonstrate resolution . Fluid is noted in the cervix.  Endometrium  Thickness: 4.1 mm.  No focal abnormality visualized.  Right ovary  Measurements: 3.8 x 2.9 x 2.6 cm. 1.9 x 2.2 x 2.0 cm cyst with a single septation. This is most likely benign.  Left ovary  Measurements: 5.8 x 5.7 x 4.9 cm. 5.2 x 4.5 x 4.3 cm complex cyst left ovary/adnexa. This could be a benign or malignant cystic ovarian mass. Gynecologic evaluation suggested.  Other findings  No free fluid.  IMPRESSION: 1. 5.2 x 4.5 x 4.3 cm complex cystic left ovarian/adnexal mass. This could be a benign or malignant cystic ovarian  lesion and gynecologic evaluation suggested. 2. 1.9 x 2.2 x 2.0 cm cyst with a single septation in the right ovary. This is most likely benign. 3. Small probable fluid collection in lower uterine segment with small amount of fluid the cervix. These changes could be cyclical. Follow-up pelvic ultrasound in 6 weeks to demonstrate resolution suggested.   Electronically Signed   By: Marcello Moores  Register   On: 03/27/2014 16:04   Ct Abdomen Pelvis W Contrast  03/27/2014   CLINICAL DATA:  Four day  history of lower abdominal pain radiating into lower back  EXAM: CT ABDOMEN AND PELVIS WITH CONTRAST  TECHNIQUE: Multidetector CT imaging of the abdomen and pelvis was performed using the standard protocol following bolus administration of intravenous contrast.  CONTRAST:  162mL OMNIPAQUE IOHEXOL 300 MG/ML  SOLN  COMPARISON:  Acute abdominal series 10 05/2013  FINDINGS: Lower Chest: The lung bases are clear. Visualized cardiac structures are within normal limits for size. No pericardial effusion. Unremarkable visualized distal thoracic esophagus.  Abdomen: Unremarkable CT appearance of the stomach, duodenum, spleen, adrenal glands and pancreas. Normal hepatic contour and morphology. No discrete hepatic lesion. Gallbladder is unremarkable. No intra or extrahepatic biliary ductal dilatation. Unremarkable appearance of the bilateral kidneys. No focal solid lesion, hydronephrosis or nephrolithiasis. On image 52, there is a tiny punctate high attenuation focus within the mid aspect of the right ureter likely representing a tiny, nonobstructing stone in transit.  No evidence of obstruction or focal bowel wall thickening. Normal appendix in the right lower quadrant. The terminal ileum is unremarkable. No free fluid or suspicious adenopathy.  Pelvis: Dominant 5.5 cm intermediate attenuation cystic structure associated with the left adnexa. The uterus is unremarkable. There is a smaller 2 cm intermediate attenuation cystic structure affiliated with the right ovary. No free fluid or suspicious adenopathy.  Bones/Soft Tissues: No acute fracture or aggressive appearing lytic or blastic osseous lesion.  Vascular: No significant atherosclerotic vascular disease, aneurysmal dilatation or acute abnormality.  IMPRESSION: 1. Probable tiny punctate, nonobstructing stone in transit in the mid right ureter. No additional nephrolithiasis identified. 2. Dominant 5.5 cm intermediate attenuation cyst associated with the left adnexa.  Differential considerations include benign complex ovarian cyst, hemorrhagic cyst, endometrioma, and much less likely an ovarian cystic neoplasm. Recommend further evaluation with transvaginal ultrasound. 3. Smaller 2 cm intermediate attenuation cyst affiliated with the right ovary is almost certainly benign.   Electronically Signed   By: Jacqulynn Cadet M.D.   On: 03/27/2014 14:39    Assessment/Plan Principal Problem:   Abdominal pain, acute: With intractable nausea, vomiting, diarrhea, with bilateral flank pains: Possibly due to gastroenteritis and nephrolithiasis with acute right kidney stone in transit. She has a history of sickle cell disease but does not appears to be sickle cell crisis. - Will obtain GI pathogen panel, LDH, haptoglobin, reticulocyte count - Will continue pain control, aggressive IV fluid hydration, antiemetics - Discussed briefly with urology,Dr Louis Meckel recommended above and adding Flomax, outpatient followup with [facility]  Active Problems:   Intractable vomiting and dehydration - For now place on antiemetics and clear liquid diet, advance as tolerated    Nephrolithiasis: As #1    Diabetes mellitus - Patient is on Lantus 40 units twice a day, as patient is not eating, having vomiting, will decrease it to 20 units BID day and sliding scale insulin. Obtain hemoglobin A1c    Complex Left ovarian cyst - No torsion noted on the ultrasound. Follow up outpatient with pelvic ultrasound in 6 weeks.  DVT prophylaxis: Lovenox  CODE STATUS: Full code  Family Communication: Admission, patients condition and plan of care including tests being ordered have been discussed with the patient who indicates understanding and agree with the plan and Code Status   Further plan will depend as patient's clinical course evolves and further radiologic and laboratory data become available.   Time Spent on Admission: 55 mins  Horris Speros Krystal Eaton M.D. [facility] 03/27/2014,  5:31 PM Pager: [phone]  If 7PM-7AM, please contact night-coverage [email or web address] Password [email or web address]  **Disclaimer: This note was dictated with voice recognition software. Similar sounding words can inadvertently be transcribed and this note may contain transcription errors which may not have been corrected upon publication of note.**

## 2014-03-27 NOTE — ED Provider Notes (Signed)
CSN: [id number]     Arrival date & time 03/27/14  1127 History   First MD Initiated Contact with Patient 03/27/14 1148     Chief Complaint  Patient presents with  . Abdominal Pain  . Back Pain     (Consider location/radiation/quality/duration/timing/severity/associated sxs/prior Treatment) The history is provided by the patient and medical records. No language interpreter was used.    Cynthia Rosales is a 33 y.o. female  [phone] with a hx of IDDM, HTN, sickle cell disease presents to the Emergency Department complaining of gradual, persistent, progressively worsening diffuse abd pain onset several days.  Pain is sharp, stabbing, waxing and waning, but never resolving. Worse with deep breathing. Pt has taking Ibuprofen and percocet without relief.  Associated symptoms include nausea, vomiting, diarrhea and mild, generalized headache.  Emesis is NBNB and diarrhea is without melena or hematochezia.  Pt reports the pain radiates to her bilateral flanks. Pt reports 2 menses this month.  The first on April 1st and the second on April 10th and this is unusual for her.  Pt denies fever chills, neck pain, chest pain, weakness, dizziness, syncope.  Pt is sexually active with 1 female partner for > 1 year.  No pain with intercourse.  Pt denies vaginal bleeding at this time. She reports the pain is similar to previous miscarriage and ovarian cyst.     Past Medical History  Diagnosis Date  . Diabetes mellitus without complication   . Hypertension   . Sickle cell disease    Past Surgical History  Procedure Laterality Date  . Cesarean section    . Appendectomy    . Breast reduction surgery    . Dilation and curettage of uterus     No family history on file. History  Substance Use Topics  . Smoking status: Never Smoker   . Smokeless tobacco: Not on file  . Alcohol Use: No   OB History   Grav Para Term Preterm Abortions TAB SAB Ect Mult Living   1              Review of Systems  Constitutional:  Negative for fever, diaphoresis, appetite change, fatigue and unexpected weight change.  HENT: Negative for mouth sores.   Eyes: Negative for visual disturbance.  Respiratory: Negative for cough, chest tightness, shortness of breath and wheezing.   Cardiovascular: Negative for chest pain.  Gastrointestinal: Positive for nausea, vomiting, abdominal pain (low) and diarrhea. Negative for constipation.  Endocrine: Negative for polydipsia, polyphagia and polyuria.  Genitourinary: Positive for flank pain (right). Negative for dysuria, urgency, frequency and hematuria.  Musculoskeletal: Negative for back pain and neck stiffness.  Skin: Negative for rash.  Allergic/Immunologic: Negative for immunocompromised state.  Neurological: Positive for headaches (generalized). Negative for syncope and light-headedness.  Hematological: Does not bruise/bleed easily.  Psychiatric/Behavioral: Negative for sleep disturbance. The patient is not nervous/anxious.       Allergies  Amoxicillin  Home Medications   Prior to Admission medications   Medication Sig Start Date End Date Taking? Authorizing Provider  exenatide (BYETTA) 5 MCG/0.02ML SOPN injection Inject 5 mcg into the skin 2 (two) times daily with a meal.   Yes Historical Provider, MD  insulin aspart (NOVOLOG) 100 UNIT/ML injection Inject 20 Units into the skin 3 (three) times daily.    Yes Historical Provider, MD  insulin glargine (LANTUS) 100 UNIT/ML injection Inject 40 Units into the skin 2 (two) times daily.    Yes Historical Provider, MD  metFORMIN (GLUCOPHAGE) 1000 MG tablet  Take 1,000 mg by mouth 2 (two) times daily.   Yes Historical Provider, MD   BP 143/85  Pulse 78  Temp(Src) 98.5 F (36.9 C) (Oral)  Resp 16  Ht 5\' 3"  (1.6 m)  Wt 310 lb (140.615 kg)  BMI 54.93 kg/m2  SpO2 100%  LMP 03/09/2014  Breastfeeding? Unknown Physical Exam  Nursing note and vitals reviewed. Constitutional: She is oriented to person, place, and time. She  appears well-developed and well-nourished. No distress.  HENT:  Head: Normocephalic and atraumatic.  Mouth/Throat: Oropharynx is clear and moist. No oropharyngeal exudate.  Eyes: Conjunctivae are normal. No scleral icterus.  Neck: Normal range of motion. Neck supple.  Full ROM without pain  Cardiovascular: Normal rate, regular rhythm, normal heart sounds and intact distal pulses.   Pulmonary/Chest: Effort normal and breath sounds normal. No respiratory distress. She has no wheezes.  Abdominal: Soft. Bowel sounds are normal. She exhibits no distension and no mass. There is no hepatosplenomegaly. There is generalized tenderness (RUQ, RLQ, LLQ). There is CVA tenderness (right). There is no rebound and no guarding. Hernia confirmed negative in the right inguinal area and confirmed negative in the left inguinal area.  Generalized abdominal tenderness worse in the right upper quadrant, right lower and left lower quadrant Right CVA tenderness though this is difficult to discern based on patient's obesity  Genitourinary: Uterus normal. Pelvic exam was performed with patient supine. There is no rash, tenderness or lesion on the right labia. There is no rash, tenderness or lesion on the left labia. Uterus is not deviated, not enlarged, not fixed and not tender. Cervix exhibits no motion tenderness, no discharge and no friability. Right adnexum displays tenderness. Right adnexum displays no mass and no fullness. Left adnexum displays tenderness. Left adnexum displays no mass and no fullness. No erythema, tenderness or bleeding around the vagina. No foreign body around the vagina. No signs of injury around the vagina. No vaginal discharge found.  Bilateral adnexal tenderness without fullness or mass Cervical motion tenderness No discharge and vaginal vault  Musculoskeletal: Normal range of motion. She exhibits no edema.  Full range of motion of the T-spine and L-spine No tenderness to palpation of the spinous  processes of the T-spine or L-spine No tenderness to palpation of the paraspinous muscles of the L-spine  Lymphadenopathy:    She has no cervical adenopathy.       Right: No inguinal adenopathy present.       Left: No inguinal adenopathy present.  Neurological: She is alert and oriented to person, place, and time. She has normal reflexes. She exhibits normal muscle tone. Coordination normal.  Speech is clear and goal oriented, follows commands Normal strength in upper and lower extremities bilaterally including dorsiflexion and plantar flexion, strong and equal grip strength Sensation normal to light and sharp touch Moves extremities without ataxia, coordination intact Normal gait Normal balance   Skin: Skin is warm and dry. No rash noted. She is not diaphoretic. No erythema.  Psychiatric: She has a normal mood and affect. Her behavior is normal.    ED Course  Procedures (including critical care time) Labs Review Labs Reviewed  WET PREP, GENITAL - Abnormal; Notable for the following:    WBC, Wet Prep HPF POC FEW (*)    All other components within normal limits  CBC WITH DIFFERENTIAL - Abnormal; Notable for the following:    RBC 5.16 (*)    MCV 71.7 (*)    MCH 23.6 (*)    RDW  17.4 (*)    All other components within normal limits  COMPREHENSIVE METABOLIC PANEL - Abnormal; Notable for the following:    Glucose, Bld 194 (*)    Albumin 2.9 (*)    Total Bilirubin <0.2 (*)    All other components within normal limits  URINALYSIS, ROUTINE W REFLEX MICROSCOPIC - Abnormal; Notable for the following:    Glucose, UA 500 (*)    Hgb urine dipstick TRACE (*)    Protein, ur 100 (*)    All other components within normal limits  URINE MICROSCOPIC-ADD ON - Abnormal; Notable for the following:    Squamous Epithelial / LPF FEW (*)    Bacteria, UA FEW (*)    All other components within normal limits  GC/CHLAMYDIA PROBE AMP  LIPASE, BLOOD  POC URINE PREG, ED  I-STAT CG4 LACTIC ACID, ED     Imaging Review Dg Chest 2 View  03/27/2014   CLINICAL DATA:  right chest pain  EXAM: CHEST  2 VIEW  COMPARISON:  DG ABD ACUTE W/CHEST dated 09/05/2013  FINDINGS: The heart size and mediastinal contours are within normal limits. Both lungs are clear. The visualized skeletal structures are unremarkable.  IMPRESSION: No active cardiopulmonary disease.   Electronically Signed   By: Margaree Mackintosh M.D.   On: 03/27/2014 13:59   US Transvaginal Non-ob  03/27/2014   CLINICAL DATA:  Pelvic pain.  EXAM: TRANSABDOMINAL AND TRANSVAGINAL ULTRASOUND OF PELVIS  TECHNIQUE: Both transabdominal and transvaginal ultrasound examinations of the pelvis were performed. Transabdominal technique was performed for global imaging of the pelvis including uterus, ovaries, adnexal regions, and pelvic cul-de-sac. It was necessary to proceed with endovaginal exam following the transabdominal exam to visualize the adnexa and ovaries.  COMPARISON:  None  FINDINGS: Uterus  Measurements: 9.8 x 4.2 x 4.8 cm. 1.0 x 0.7 x 0.9 cm cystic region in the lower uterine segment to. This could be fluid. A follow-up pelvic ultrasound is suggested to demonstrate resolution . Fluid is noted in the cervix.  Endometrium  Thickness: 4.1 mm.  No focal abnormality visualized.  Right ovary  Measurements: 3.8 x 2.9 x 2.6 cm. 1.9 x 2.2 x 2.0 cm cyst with a single septation. This is most likely benign.  Left ovary  Measurements: 5.8 x 5.7 x 4.9 cm. 5.2 x 4.5 x 4.3 cm complex cyst left ovary/adnexa. This could be a benign or malignant cystic ovarian mass. Gynecologic evaluation suggested.  Other findings  No free fluid.  IMPRESSION: 1. 5.2 x 4.5 x 4.3 cm complex cystic left ovarian/adnexal mass. This could be a benign or malignant cystic ovarian lesion and gynecologic evaluation suggested. 2. 1.9 x 2.2 x 2.0 cm cyst with a single septation in the right ovary. This is most likely benign. 3. Small probable fluid collection in lower uterine segment with small amount  of fluid the cervix. These changes could be cyclical. Follow-up pelvic ultrasound in 6 weeks to demonstrate resolution suggested.   Electronically Signed   By: Marcello Moores  Register   On: 03/27/2014 16:04   Ct Abdomen Pelvis W Contrast  03/27/2014   CLINICAL DATA:  Four day history of lower abdominal pain radiating into lower back  EXAM: CT ABDOMEN AND PELVIS WITH CONTRAST  TECHNIQUE: Multidetector CT imaging of the abdomen and pelvis was performed using the standard protocol following bolus administration of intravenous contrast.  CONTRAST:  110mL OMNIPAQUE IOHEXOL 300 MG/ML  SOLN  COMPARISON:  Acute abdominal series 10 05/2013  FINDINGS: Lower Chest: The lung bases  are clear. Visualized cardiac structures are within normal limits for size. No pericardial effusion. Unremarkable visualized distal thoracic esophagus.  Abdomen: Unremarkable CT appearance of the stomach, duodenum, spleen, adrenal glands and pancreas. Normal hepatic contour and morphology. No discrete hepatic lesion. Gallbladder is unremarkable. No intra or extrahepatic biliary ductal dilatation. Unremarkable appearance of the bilateral kidneys. No focal solid lesion, hydronephrosis or nephrolithiasis. On image 52, there is a tiny punctate high attenuation focus within the mid aspect of the right ureter likely representing a tiny, nonobstructing stone in transit.  No evidence of obstruction or focal bowel wall thickening. Normal appendix in the right lower quadrant. The terminal ileum is unremarkable. No free fluid or suspicious adenopathy.  Pelvis: Dominant 5.5 cm intermediate attenuation cystic structure associated with the left adnexa. The uterus is unremarkable. There is a smaller 2 cm intermediate attenuation cystic structure affiliated with the right ovary. No free fluid or suspicious adenopathy.  Bones/Soft Tissues: No acute fracture or aggressive appearing lytic or blastic osseous lesion.  Vascular: No significant atherosclerotic vascular disease,  aneurysmal dilatation or acute abnormality.  IMPRESSION: 1. Probable tiny punctate, nonobstructing stone in transit in the mid right ureter. No additional nephrolithiasis identified. 2. Dominant 5.5 cm intermediate attenuation cyst associated with the left adnexa. Differential considerations include benign complex ovarian cyst, hemorrhagic cyst, endometrioma, and much less likely an ovarian cystic neoplasm. Recommend further evaluation with transvaginal ultrasound. 3. Smaller 2 cm intermediate attenuation cyst affiliated with the right ovary is almost certainly benign.   Electronically Signed   By: Jacqulynn Cadet M.D.   On: 03/27/2014 14:39     EKG Interpretation None      MDM   Final diagnoses:  Renal colic  Ovarian cyst  Intractable vomiting   Cynthia Rosales presents with intense abdominal pain and tenderness on exam. Will obtain blood work, CT scan.  Patient's blood work reassuring, pregnancy test negative, lactic acid within normal limits.  UA with hemoglobin but no evidence of urinary tract infection.  CT scan with probable tiny punctate nonobstructing stone in transit in the mid right ureter. This would account for patient's right upper quadrant, right lower cortex and right flank pain. CT also shows several cysts on the bilateral ovaries. Will obtain ultrasound for this.  Patient now vomiting. Will re\re dose pain control and antiemetics.  Ultrasound with a 5 x 5 x 4 complex cyst in the left ovary and 2 by 2 x 2 in the right ovary.  Oncologic followup is indicated for this.  5:08 PM She continues to have emesis after numerous rounds of pain control and anti-emetics. Will proceed with admission for intractable vomiting.  Patient vitals remained stable. She remained afebrile.  Her tachycardia has resolved.  5:13 PM Discussed with Dr. Tana Coast who will admit.  She requests that urology consult.    5:42 PM Discussed with Dr. Louis Meckel who reports that the patient should be given flomax,  antiemetics and pain control. When the patient is able to tolerate PO she is to be d/c to home with [facility] follow-up.    Jarrett Soho Cynthia Standre, PA-C 03/27/14 1743

## 2014-03-28 DIAGNOSIS — I10 Essential (primary) hypertension: Secondary | ICD-10-CM | POA: Diagnosis present

## 2014-03-28 LAB — GLUCOSE, CAPILLARY
Glucose-Capillary: 170 mg/dL — ABNORMAL HIGH (ref 70–99)
Glucose-Capillary: 170 mg/dL — ABNORMAL HIGH (ref 70–99)
Glucose-Capillary: 138 mg/dL — ABNORMAL HIGH (ref 70–99)
Glucose-Capillary: 185 mg/dL — ABNORMAL HIGH (ref 70–99)

## 2014-03-28 LAB — HEMOGLOBIN A1C
Hgb A1c MFr Bld: 9.3 % — ABNORMAL HIGH (ref <5.7)
Mean Plasma Glucose: 220 mg/dL — ABNORMAL HIGH (ref <117)

## 2014-03-28 LAB — CBC
HCT: 34 % — ABNORMAL LOW (ref 36.0–46.0)
Hemoglobin: 10.8 g/dL — ABNORMAL LOW (ref 12.0–15.0)
MCH: 23.2 pg — ABNORMAL LOW (ref 26.0–34.0)
MCHC: 31.8 g/dL (ref 30.0–36.0)
MCV: 73.1 fL — ABNORMAL LOW (ref 78.0–100.0)
Platelets: 292 10*3/uL (ref 150–400)
RBC: 4.65 MIL/uL (ref 3.87–5.11)
RDW: 17.6 % — ABNORMAL HIGH (ref 11.5–15.5)
WBC: 9 10*3/uL (ref 4.0–10.5)

## 2014-03-28 LAB — BASIC METABOLIC PANEL
BUN: 14 mg/dL (ref 6–23)
CO2: 27 mEq/L (ref 19–32)
Calcium: 8.6 mg/dL (ref 8.4–10.5)
Chloride: 103 mEq/L (ref 96–112)
Creatinine, Ser: 0.78 mg/dL (ref 0.50–1.10)
GFR calc Af Amer: >90 mL/min (ref >90)
GFR calc non Af Amer: >90 mL/min (ref >90)
Glucose, Bld: 235 mg/dL — ABNORMAL HIGH (ref 70–99)
Potassium: 4.3 mEq/L (ref 3.7–5.3)
Sodium: 141 mEq/L (ref 137–147)

## 2014-03-28 LAB — GC/CHLAMYDIA PROBE AMP
CT Probe RNA: NEGATIVE
GC Probe RNA: NEGATIVE

## 2014-03-28 LAB — HAPTOGLOBIN: Haptoglobin: 381 mg/dL — ABNORMAL HIGH (ref 45–215)

## 2014-03-28 MED ORDER — KETOROLAC TROMETHAMINE 30 MG/ML IJ SOLN
30.0000 mg | Freq: Once | INTRAMUSCULAR | Status: AC
  Administered 2014-03-28: 30 mg via INTRAVENOUS
  Filled 2014-03-28: qty 1

## 2014-03-28 MED ORDER — PANTOPRAZOLE SODIUM 40 MG PO TBEC
40.0000 mg | DELAYED_RELEASE_TABLET | Freq: Every day | ORAL | Status: DC
Start: 2014-03-28 — End: 2014-03-29
  Administered 2014-03-28 – 2014-03-29 (×2): 40 mg via ORAL
  Filled 2014-03-28 (×2): qty 1

## 2014-03-28 MED ORDER — HYDROMORPHONE HCL PF 1 MG/ML IJ SOLN
2.0000 mg | INTRAMUSCULAR | Status: DC | PRN
  Administered 2014-03-28 – 2014-03-29 (×3): 2 mg via INTRAVENOUS
  Filled 2014-03-28 (×4): qty 2

## 2014-03-28 MED ORDER — METOPROLOL TARTRATE 12.5 MG HALF TABLET
12.5000 mg | ORAL_TABLET | Freq: Two times a day (BID) | ORAL | Status: DC
  Administered 2014-03-28 – 2014-03-29 (×2): 12.5 mg via ORAL
  Filled 2014-03-28 (×3): qty 1

## 2014-03-28 MED ORDER — INSULIN GLARGINE 100 UNIT/ML ~~LOC~~ SOLN
24.0000 [IU] | Freq: Two times a day (BID) | SUBCUTANEOUS | Status: DC
  Administered 2014-03-28 – 2014-03-29 (×2): 24 [IU] via SUBCUTANEOUS
  Filled 2014-03-28 (×3): qty 0.24

## 2014-03-28 MED ORDER — OXYCODONE-ACETAMINOPHEN 5-325 MG PO TABS
2.0000 | ORAL_TABLET | ORAL | Status: DC | PRN
  Administered 2014-03-29: 2 via ORAL
  Filled 2014-03-28: qty 2

## 2014-03-28 MED ORDER — LEVOFLOXACIN IN D5W 750 MG/150ML IV SOLN
750.0000 mg | INTRAVENOUS | Status: DC
  Administered 2014-03-28: 750 mg via INTRAVENOUS
  Filled 2014-03-28 (×2): qty 150

## 2014-03-28 MED ORDER — HYDRALAZINE HCL 20 MG/ML IJ SOLN: 10.0000 mg | Freq: Four times a day (QID) | INTRAMUSCULAR | Status: DC | PRN

## 2014-03-28 NOTE — Progress Notes (Signed)
Patient complaining of abdominal pain (right upper side) unrealived by PRN pain medication - MD was paged two different times; we are waiting for new orders.

## 2014-03-28 NOTE — Progress Notes (Signed)
[facility] PROGRESS NOTE  [name] KKX:[id number] DOB: 11/09/1981 DOA: 03/27/2014 PCP: Elyn Peers, MD  Brief narrative I have seen and examined [name] at bedside and reviewed her chart. Patient is a pleasant 33 year old female with insulin-dependent diabetes mellitus, hypertension, morbid obesity BMI 40 to 49.9 and sickle cell disease who presented to the ED with diffuse abdominal pain, and right flank pain for the last 5 days. CT abdomen and pelvis showed probable tiny punctate nonobstructing stone in transit in the mid right ureter, no additional nephrolithiasis. Transabdominal and vaginal ultrasound revealed complex cystic left ovarian mass could be benign or malignant, small cyst with single septation in the right ovary, likely benign, with recommendation to followup pelvic ultrasound in 6 weeks. UA did not suggest UTI.  She also reported intractable nausea, vomiting and has not been holding anything down. It appears that her pain is related to nephrolithiasis and. Although a urinary was negative she is she doing, suggesting possible infection and and will therefore on the side of treating for infection with Levaquin. She is to follow up with the urology and outpatient setting regarding the nephrolithiasis. She continuous to half pain and in the right flank. We will adjust her pain medications and continue rehydration. If she continues to have pain, we may need to call Urology in house. Patient is also noted to have high blood pressure which may be due to benign essential hypertension with element of pain. Plan Abdominal pain, acute/Intractable vomiting/Nephrolithiasis/Dehydration  Add Levaquin  Continue rehydration  Follow Urology/gynecology outpatient.  Start feeding Diabetes mellitus  Uncontrolled  Continue and adjust insulin as necessary HTN (hypertension), benign  Appears to be a new diagnosis  Lopressor  Hydralazinet and as needed DVT/GI  prophylaxis  Lovenox  PPI Code Status: Full Family Communication: None at bedside Disposition Plan: Eventually home.   Consultants:  Urology  Procedures:  None  Antibiotics:  Levaquin 03/28/14>  HPI/Subjective: Complains of severe right-sided abdominal pain as well as chattering of her teeth because of pain  Objective: Filed Vitals:   03/28/14 1457  BP: 171/95  Pulse: 95  Temp: 98.2 F (36.8 C)  Resp: 18    Intake/Output Summary (Last 24 hours) at 03/28/14 1703 Last data filed at 03/28/14 1217  Gross per 24 hour  Intake    620 ml  Output    850 ml  Net   -230 ml   Filed Weights   03/27/14 1141 03/27/14 1817  Weight: 140.615 kg (310 lb) 140.61 kg (309 lb 15.8 oz)    Exam:   General:  She is in discomfort  Cardiovascular: RRR. No murmurs. S1S2 normal.  Respiratory: Lungs clear  Abdomen: Tenderness in the right flank  Musculoskeletal: No pedal edema  Data Reviewed: Basic Metabolic Panel:  Recent Labs Lab 03/27/14 1227 03/27/14 1926 03/28/14 0510  NA 138  --  141  K 4.1  --  4.3  CL 102  --  103  CO2 23  --  27  GLUCOSE 194*  --  235*  BUN 12  --  14  CREATININE 0.82 0.79 0.78  CALCIUM 9.3  --  8.6   Liver Function Tests:  Recent Labs Lab 03/27/14 1227  AST 13  ALT 12  ALKPHOS 100  BILITOT <0.2*  PROT 7.8  ALBUMIN 2.9*    Recent Labs Lab 03/27/14 1227  LIPASE 27   No results found for this basename: AMMONIA,  in the last 168 hours CBC:  Recent Labs Lab 03/27/14 1227 03/27/14  1926 03/28/14 0510  WBC 9.2 11.6* 9.0  NEUTROABS 5.4  --   --   HGB 12.2 11.3* 10.8*  HCT 37.0 34.5* 34.0*  MCV 71.7* 71.7* 73.1*  PLT 321 314 292   Cardiac Enzymes: No results found for this basename: CKTOTAL, CKMB, CKMBINDEX, TROPONINI,  in the last 168 hours BNP (last 3 results) No results found for this basename: PROBNP,  in the last 8760 hours CBG:  Recent Labs Lab 03/27/14 1836 03/27/14 2152 03/28/14 0756 03/28/14 1220   GLUCAP 84 168* 170* 170*    Recent Results (from the past 240 hour(s))  WET PREP, GENITAL     Status: Abnormal   Collection Time    03/27/14  1:11 PM      Result Value Ref Range Status   Yeast Wet Prep HPF POC NONE SEEN  NONE SEEN Final   Trich, Wet Prep NONE SEEN  NONE SEEN Final   Clue Cells Wet Prep HPF POC NONE SEEN  NONE SEEN Final   WBC, Wet Prep HPF POC FEW (*) NONE SEEN Final  GC/CHLAMYDIA PROBE AMP     Status: None   Collection Time    03/27/14  1:11 PM      Result Value Ref Range Status   CT Probe RNA NEGATIVE  NEGATIVE Final   GC Probe RNA NEGATIVE  NEGATIVE Final   Comment: (NOTE)                                                                                               **Normal Reference Range: Negative**          Assay performed using the Gen-Probe APTIMA COMBO2 (R) Assay.     Acceptable specimen types for this assay include APTIMA Swabs (Unisex,     endocervical, urethral, or vaginal), first void urine, and ThinPrep     liquid based cytology samples.     Performed at [facility]     Studies: Dg Chest 2 View  03/27/2014   CLINICAL DATA:  right chest pain  EXAM: CHEST  2 VIEW  COMPARISON:  DG ABD ACUTE W/CHEST dated 09/05/2013  FINDINGS: The heart size and mediastinal contours are within normal limits. Both lungs are clear. The visualized skeletal structures are unremarkable.  IMPRESSION: No active cardiopulmonary disease.   Electronically Signed   By: Margaree Mackintosh M.D.   On: 03/27/2014 13:59   US Transvaginal Non-ob  03/27/2014   CLINICAL DATA:  Pelvic pain.  EXAM: TRANSABDOMINAL AND TRANSVAGINAL ULTRASOUND OF PELVIS  TECHNIQUE: Both transabdominal and transvaginal ultrasound examinations of the pelvis were performed. Transabdominal technique was performed for global imaging of the pelvis including uterus, ovaries, adnexal regions, and pelvic cul-de-sac. It was necessary to proceed with endovaginal exam following the transabdominal exam to visualize the  adnexa and ovaries.  COMPARISON:  None  FINDINGS: Uterus  Measurements: 9.8 x 4.2 x 4.8 cm. 1.0 x 0.7 x 0.9 cm cystic region in the lower uterine segment to. This could be fluid. A follow-up pelvic ultrasound is suggested to demonstrate resolution . Fluid is noted in the cervix.  Endometrium  Thickness: 4.1 mm.  No focal abnormality visualized.  Right ovary  Measurements: 3.8 x 2.9 x 2.6 cm. 1.9 x 2.2 x 2.0 cm cyst with a single septation. This is most likely benign.  Left ovary  Measurements: 5.8 x 5.7 x 4.9 cm. 5.2 x 4.5 x 4.3 cm complex cyst left ovary/adnexa. This could be a benign or malignant cystic ovarian mass. Gynecologic evaluation suggested.  Other findings  No free fluid.  IMPRESSION: 1. 5.2 x 4.5 x 4.3 cm complex cystic left ovarian/adnexal mass. This could be a benign or malignant cystic ovarian lesion and gynecologic evaluation suggested. 2. 1.9 x 2.2 x 2.0 cm cyst with a single septation in the right ovary. This is most likely benign. 3. Small probable fluid collection in lower uterine segment with small amount of fluid the cervix. These changes could be cyclical. Follow-up pelvic ultrasound in 6 weeks to demonstrate resolution suggested.   Electronically Signed   By: Marcello Moores  Register   On: 03/27/2014 16:04   Ct Abdomen Pelvis W Contrast  03/27/2014   CLINICAL DATA:  Four day history of lower abdominal pain radiating into lower back  EXAM: CT ABDOMEN AND PELVIS WITH CONTRAST  TECHNIQUE: Multidetector CT imaging of the abdomen and pelvis was performed using the standard protocol following bolus administration of intravenous contrast.  CONTRAST:  119mL OMNIPAQUE IOHEXOL 300 MG/ML  SOLN  COMPARISON:  Acute abdominal series 10 05/2013  FINDINGS: Lower Chest: The lung bases are clear. Visualized cardiac structures are within normal limits for size. No pericardial effusion. Unremarkable visualized distal thoracic esophagus.  Abdomen: Unremarkable CT appearance of the stomach, duodenum, spleen, adrenal  glands and pancreas. Normal hepatic contour and morphology. No discrete hepatic lesion. Gallbladder is unremarkable. No intra or extrahepatic biliary ductal dilatation. Unremarkable appearance of the bilateral kidneys. No focal solid lesion, hydronephrosis or nephrolithiasis. On image 52, there is a tiny punctate high attenuation focus within the mid aspect of the right ureter likely representing a tiny, nonobstructing stone in transit.  No evidence of obstruction or focal bowel wall thickening. Normal appendix in the right lower quadrant. The terminal ileum is unremarkable. No free fluid or suspicious adenopathy.  Pelvis: Dominant 5.5 cm intermediate attenuation cystic structure associated with the left adnexa. The uterus is unremarkable. There is a smaller 2 cm intermediate attenuation cystic structure affiliated with the right ovary. No free fluid or suspicious adenopathy.  Bones/Soft Tissues: No acute fracture or aggressive appearing lytic or blastic osseous lesion.  Vascular: No significant atherosclerotic vascular disease, aneurysmal dilatation or acute abnormality.  IMPRESSION: 1. Probable tiny punctate, nonobstructing stone in transit in the mid right ureter. No additional nephrolithiasis identified. 2. Dominant 5.5 cm intermediate attenuation cyst associated with the left adnexa. Differential considerations include benign complex ovarian cyst, hemorrhagic cyst, endometrioma, and much less likely an ovarian cystic neoplasm. Recommend further evaluation with transvaginal ultrasound. 3. Smaller 2 cm intermediate attenuation cyst affiliated with the right ovary is almost certainly benign.   Electronically Signed   By: Jacqulynn Cadet M.D.   On: 03/27/2014 14:39    Scheduled Meds: . enoxaparin (LOVENOX) injection  40 mg Subcutaneous Q24H  . insulin aspart  0-5 Units Subcutaneous QHS  . insulin aspart  0-9 Units Subcutaneous TID WC  . insulin glargine  20 Units Subcutaneous BID  . ketorolac  30 mg  Intravenous Once  . levofloxacin (LEVAQUIN) IV  750 mg Intravenous Q24H  . metoprolol tartrate  12.5 mg Oral BID  . pantoprazole  40 mg  Oral Q1200  . tamsulosin  0.4 mg Oral Daily   Continuous Infusions: . sodium chloride 1,000 mL (03/28/14 1256)       [facility] Pager [phone]. If 7PM-7AM, please contact night-coverage at [email or web address], password [facility] 03/28/2014, 5:03 PM  LOS: 1 day

## 2014-03-29 ENCOUNTER — Inpatient Hospital Stay (HOSPITAL_COMMUNITY): Payer: Medicaid Other

## 2014-03-29 LAB — CBC
HCT: 34.8 % — ABNORMAL LOW (ref 36.0–46.0)
Hemoglobin: 11 g/dL — ABNORMAL LOW (ref 12.0–15.0)
MCH: 23.3 pg — ABNORMAL LOW (ref 26.0–34.0)
MCHC: 31.6 g/dL (ref 30.0–36.0)
MCV: 73.6 fL — ABNORMAL LOW (ref 78.0–100.0)
Platelets: 296 10*3/uL (ref 150–400)
RBC: 4.73 MIL/uL (ref 3.87–5.11)
RDW: 17.5 % — ABNORMAL HIGH (ref 11.5–15.5)
WBC: 7.9 10*3/uL (ref 4.0–10.5)

## 2014-03-29 LAB — COMPREHENSIVE METABOLIC PANEL
ALT: 10 U/L (ref 0–35)
AST: 12 U/L (ref 0–37)
Albumin: 2.8 g/dL — ABNORMAL LOW (ref 3.5–5.2)
Alkaline Phosphatase: 81 U/L (ref 39–117)
BUN: 11 mg/dL (ref 6–23)
CO2: 25 mEq/L (ref 19–32)
Calcium: 8.3 mg/dL — ABNORMAL LOW (ref 8.4–10.5)
Chloride: 100 mEq/L (ref 96–112)
Creatinine, Ser: 0.68 mg/dL (ref 0.50–1.10)
GFR calc Af Amer: >90 mL/min (ref >90)
GFR calc non Af Amer: >90 mL/min (ref >90)
Glucose, Bld: 142 mg/dL — ABNORMAL HIGH (ref 70–99)
Potassium: 4.3 mEq/L (ref 3.7–5.3)
Sodium: 137 mEq/L (ref 137–147)
Total Bilirubin: 0.2 mg/dL — ABNORMAL LOW (ref 0.3–1.2)
Total Protein: 7.4 g/dL (ref 6.0–8.3)

## 2014-03-29 LAB — GLUCOSE, CAPILLARY
Glucose-Capillary: 151 mg/dL — ABNORMAL HIGH (ref 70–99)
Glucose-Capillary: 122 mg/dL — ABNORMAL HIGH (ref 70–99)
Glucose-Capillary: 233 mg/dL — ABNORMAL HIGH (ref 70–99)

## 2014-03-29 LAB — PHOSPHORUS: Phosphorus: 2.2 mg/dL — ABNORMAL LOW (ref 2.3–4.6)

## 2014-03-29 LAB — MAGNESIUM: Magnesium: 1.7 mg/dL (ref 1.5–2.5)

## 2014-03-29 MED ORDER — METOPROLOL TARTRATE 12.5 MG HALF TABLET: 12.5000 mg | ORAL_TABLET | Freq: Two times a day (BID) | ORAL | Status: DC

## 2014-03-29 MED ORDER — TAMSULOSIN HCL 0.4 MG PO CAPS: 0.4000 mg | ORAL_CAPSULE | Freq: Every day | ORAL | Status: DC

## 2014-03-29 MED ORDER — PANTOPRAZOLE SODIUM 40 MG PO TBEC: 40.0000 mg | DELAYED_RELEASE_TABLET | Freq: Every day | ORAL | Status: DC

## 2014-03-29 MED ORDER — LEVOFLOXACIN 500 MG PO TABS: 500.0000 mg | ORAL_TABLET | Freq: Every day | ORAL | Status: DC

## 2014-03-29 MED ORDER — POTASSIUM & SODIUM PHOSPHATES 280-160-250 MG PO PACK
1.0000 | PACK | Freq: Three times a day (TID) | ORAL | Status: DC
  Administered 2014-03-29: 1 via ORAL
  Filled 2014-03-29 (×3): qty 1

## 2014-03-29 MED ORDER — SODIUM CHLORIDE 0.9 % IV SOLN
INTRAVENOUS | Status: DC
Start: 2014-03-29 — End: 2014-03-29

## 2014-03-29 MED ORDER — MAGNESIUM SULFATE 40 MG/ML IJ SOLN
2.0000 g | Freq: Once | INTRAMUSCULAR | Status: AC
  Administered 2014-03-29: 2 g via INTRAVENOUS
  Filled 2014-03-29: qty 50

## 2014-03-29 MED ORDER — METOCLOPRAMIDE HCL 5 MG/ML IJ SOLN
10.0000 mg | Freq: Once | INTRAMUSCULAR | Status: AC
  Administered 2014-03-29: 10 mg via INTRAVENOUS
  Filled 2014-03-29: qty 2

## 2014-03-29 NOTE — Plan of Care (Signed)
Problem: Food- and Nutrition-Related Knowledge Deficit (NB-1.1) Goal: Nutrition education Formal process to instruct or train a patient/client in a skill or to impart knowledge to help patients/clients voluntarily manage or modify food choices and eating behavior to maintain or improve health. Outcome: Completed/Met Date Met:  03/29/14 Nutrition Education Note  RD consulted for nutrition education regarding diabetes. Per consult, pt does not know how to count carbohydrates. Pt reports she is very nauseous right now - confirmed with RN. Politely asked RD to return at later date. RD left handouts with contact info, will re-attempt education at more appropriate time.    Lab Results  Component Value Date    HGBA1C 9.3* 03/27/2014    RD provided "Carbohydrate Counting for People with Diabetes" handout from the Academy of [employer]etics. Discussed different food groups and their effects on blood sugar, emphasizing carbohydrate-containing foods. Provided list of carbohydrates and recommended serving sizes of common foods.  Discussed importance of controlled and consistent carbohydrate intake throughout the day. Provided examples of ways to balance meals/snacks and encouraged intake of high-fiber, whole grain complex carbohydrates. Teach back method used.  Unable to assess compliance.  Body mass index is 54.93 kg/(m^2). Pt meets criteria for Obese Class III based on current BMI.  Current diet order is Carbohydrate Modified Medium. Labs and medications reviewed. No further nutrition interventions warranted at this time. RD contact information provided. If additional nutrition issues arise, please re-consult RD.  Inda Coke MS, RD, LDN Inpatient Registered Dietitian Pager: [phone] After-hours pager: [phone]

## 2014-03-29 NOTE — Discharge Summary (Signed)
Cynthia Rosales, is a 33 y.o. female  DOB 1981/09/04  MRN [id number].  Admission date:  03/27/2014  Admitting Physician  Ripudeep Krystal Eaton, MD  Discharge Date:  03/29/2014   Primary MD  Elyn Peers, MD  Recommendations for primary care physician for things to follow:   Please refer to Urology for nephrolithiasis. Optimize blood pressure medications.   Admission Diagnosis  Renal colic [409.8] Dehydration [276.51] Ovarian cyst [620.2] Intractable vomiting [536.2] Abdominal pain, acute [789.00, 338.19] Diabetes mellitus [250.00] Abdominal pain [789.00]   Discharge Diagnosis  Renal colic [119.1] Dehydration [276.51] Ovarian cyst [620.2] Intractable vomiting [536.2] Abdominal pain, acute [789.00, 338.19] Diabetes mellitus [250.00] Abdominal pain [789.00]    Active Problems:   Nephrolithiasis   Diabetes mellitus   HTN (hypertension), benign      Past Medical History  Diagnosis Date  . Hypertension   . Asthma   . Type II diabetes mellitus   . History of blood transfusion     "after I had one of my kids"  . Sickle cell disease   . Anemia   . Depression     Past Surgical History  Procedure Laterality Date  . Cesarean section  2000; 2007; 2011  . Dilation and curettage of uterus    . Appendectomy  2013  . Reduction mammaplasty Bilateral 1998     Discharge Condition: Relatively stable. Had to leave to attend to a family emergency    Discharge Instructions  and  Discharge Medications     Medication List         exenatide 5 MCG/0.02ML Sopn injection  Commonly known as:  BYETTA  Inject 5 mcg into the skin 2 (two) times daily with a meal.     insulin aspart 100 UNIT/ML injection  Commonly known as:  novoLOG  Inject 20 Units into the skin 3 (three) times daily.     insulin glargine 100 UNIT/ML injection    Commonly known as:  LANTUS  Inject 40 Units into the skin 2 (two) times daily.     levofloxacin 500 MG tablet  Commonly known as:  LEVAQUIN  Take 1 tablet (500 mg total) by mouth daily.     metFORMIN 1000 MG tablet  Commonly known as:  GLUCOPHAGE  Take 1,000 mg by mouth 2 (two) times daily.     metoprolol tartrate 12.5 mg Tabs tablet  Commonly known as:  LOPRESSOR  Take 0.5 tablets (12.5 mg total) by mouth 2 (two) times daily.     pantoprazole 40 MG tablet  Commonly known as:  PROTONIX  Take 1 tablet (40 mg total) by mouth daily at 12 noon.     tamsulosin 0.4 MG Caps capsule  Commonly known as:  FLOMAX  Take 1 capsule (0.4 mg total) by mouth daily.          Diet and Activity recommendation: See Discharge Instructions above   Consults obtained - urology   Major procedures and Radiology Reports - PLEASE review detailed and final reports for all details, in brief -  Dg Chest 2 View  03/27/2014   CLINICAL DATA:  right chest pain  EXAM: CHEST  2 VIEW  COMPARISON:  DG ABD ACUTE W/CHEST dated 09/05/2013  FINDINGS: The heart size and mediastinal contours are within normal limits. Both lungs are clear. The visualized skeletal structures are unremarkable.  IMPRESSION: No active cardiopulmonary disease.   Electronically Signed   By: Margaree Mackintosh M.D.   On: 03/27/2014 13:59   US Transvaginal Non-ob  03/27/2014   CLINICAL DATA:  Pelvic pain.  EXAM: TRANSABDOMINAL AND TRANSVAGINAL ULTRASOUND OF PELVIS  TECHNIQUE: Both transabdominal and transvaginal ultrasound examinations of the pelvis were performed. Transabdominal technique was performed for global imaging of the pelvis including uterus, ovaries, adnexal regions, and pelvic cul-de-sac. It was necessary to proceed with endovaginal exam following the transabdominal exam to visualize the adnexa and ovaries.  COMPARISON:  None  FINDINGS: Uterus  Measurements: 9.8 x 4.2 x 4.8 cm. 1.0 x 0.7 x 0.9 cm cystic region in the lower uterine  segment to. This could be fluid. A follow-up pelvic ultrasound is suggested to demonstrate resolution . Fluid is noted in the cervix.  Endometrium  Thickness: 4.1 mm.  No focal abnormality visualized.  Right ovary  Measurements: 3.8 x 2.9 x 2.6 cm. 1.9 x 2.2 x 2.0 cm cyst with a single septation. This is most likely benign.  Left ovary  Measurements: 5.8 x 5.7 x 4.9 cm. 5.2 x 4.5 x 4.3 cm complex cyst left ovary/adnexa. This could be a benign or malignant cystic ovarian mass. Gynecologic evaluation suggested.  Other findings  No free fluid.  IMPRESSION: 1. 5.2 x 4.5 x 4.3 cm complex cystic left ovarian/adnexal mass. This could be a benign or malignant cystic ovarian lesion and gynecologic evaluation suggested. 2. 1.9 x 2.2 x 2.0 cm cyst with a single septation in the right ovary. This is most likely benign. 3. Small probable fluid collection in lower uterine segment with small amount of fluid the cervix. These changes could be cyclical. Follow-up pelvic ultrasound in 6 weeks to demonstrate resolution suggested.   Electronically Signed   By: Marcello Moores  Register   On: 03/27/2014 16:04   Ct Abdomen Pelvis W Contrast  03/27/2014   CLINICAL DATA:  Four day history of lower abdominal pain radiating into lower back  EXAM: CT ABDOMEN AND PELVIS WITH CONTRAST  TECHNIQUE: Multidetector CT imaging of the abdomen and pelvis was performed using the standard protocol following bolus administration of intravenous contrast.  CONTRAST:  139mL OMNIPAQUE IOHEXOL 300 MG/ML  SOLN  COMPARISON:  Acute abdominal series 10 05/2013  FINDINGS: Lower Chest: The lung bases are clear. Visualized cardiac structures are within normal limits for size. No pericardial effusion. Unremarkable visualized distal thoracic esophagus.  Abdomen: Unremarkable CT appearance of the stomach, duodenum, spleen, adrenal glands and pancreas. Normal hepatic contour and morphology. No discrete hepatic lesion. Gallbladder is unremarkable. No intra or extrahepatic  biliary ductal dilatation. Unremarkable appearance of the bilateral kidneys. No focal solid lesion, hydronephrosis or nephrolithiasis. On image 52, there is a tiny punctate high attenuation focus within the mid aspect of the right ureter likely representing a tiny, nonobstructing stone in transit.  No evidence of obstruction or focal bowel wall thickening. Normal appendix in the right lower quadrant. The terminal ileum is unremarkable. No free fluid or suspicious adenopathy.  Pelvis: Dominant 5.5 cm intermediate attenuation cystic structure associated with the left adnexa. The uterus is unremarkable. There is a smaller 2 cm intermediate attenuation cystic structure affiliated with the  right ovary. No free fluid or suspicious adenopathy.  Bones/Soft Tissues: No acute fracture or aggressive appearing lytic or blastic osseous lesion.  Vascular: No significant atherosclerotic vascular disease, aneurysmal dilatation or acute abnormality.  IMPRESSION: 1. Probable tiny punctate, nonobstructing stone in transit in the mid right ureter. No additional nephrolithiasis identified. 2. Dominant 5.5 cm intermediate attenuation cyst associated with the left adnexa. Differential considerations include benign complex ovarian cyst, hemorrhagic cyst, endometrioma, and much less likely an ovarian cystic neoplasm. Recommend further evaluation with transvaginal ultrasound. 3. Smaller 2 cm intermediate attenuation cyst affiliated with the right ovary is almost certainly benign.   Electronically Signed   By: Jacqulynn Cadet M.D.   On: 03/27/2014 14:39   Dg Abd 2 Views  03/29/2014   CLINICAL DATA:  Abdominal pain, nausea and vomiting  EXAM: ABDOMEN - 2 VIEW  COMPARISON:  CT ABD/PELVIS W CM dated 03/27/2014; DG ABD ACUTE W/CHEST dated 09/05/2013  FINDINGS: Ingested material is seen throughout the colon.  Moderate to large colonic stool burden without evidence of obstruction. No pneumoperitoneum, pneumatosis or portal venous gas.  Several  phleboliths overlie the right lower pelvis. Otherwise, no abnormal intra-abdominal calcifications.  Limited visualization of the lower thorax suggest borderline enlargement of the cardiac silhouette, possibly accentuated due to hypoventilation.  No acute osseus abnormalities.  IMPRESSION: Moderate to large colonic stool burden without evidence of obstruction.   Electronically Signed   By: Sandi Mariscal M.D.   On: 03/29/2014 15:37    Micro Results   Recent Results (from the past 240 hour(s))  WET PREP, GENITAL     Status: Abnormal   Collection Time    03/27/14  1:11 PM      Result Value Ref Range Status   Yeast Wet Prep HPF POC NONE SEEN  NONE SEEN Final   Trich, Wet Prep NONE SEEN  NONE SEEN Final   Clue Cells Wet Prep HPF POC NONE SEEN  NONE SEEN Final   WBC, Wet Prep HPF POC FEW (*) NONE SEEN Final  GC/CHLAMYDIA PROBE AMP     Status: None   Collection Time    03/27/14  1:11 PM      Result Value Ref Range Status   CT Probe RNA NEGATIVE  NEGATIVE Final   GC Probe RNA NEGATIVE  NEGATIVE Final   Comment: (NOTE)                                                                                               **Normal Reference Range: Negative**          Assay performed using the Gen-Probe APTIMA COMBO2 (R) Assay.     Acceptable specimen types for this assay include APTIMA Swabs (Unisex,     endocervical, urethral, or vaginal), first void urine, and ThinPrep     liquid based cytology samples.     Performed at [facility]     History of present illness and  Hospital Course:    Kindly see H&P for history of present illness and admission details, please review complete Labs, Consult reports and Test reports for all details in  brief Cynthia Rosales, is a pleasant 33 year old female with insulin-dependent diabetes mellitus, hypertension, morbid obesity BMI 40 to 49.9 and sickle cell disease who presented to the ED with diffuse abdominal pain, and right flank pain for the last 5 days. CT  abdomen and pelvis showed probable tiny punctate nonobstructing stone in transit in the mid right ureter, no additional nephrolithiasis. Transabdominal and vaginal ultrasound revealed complex cystic left ovarian mass could be benign or malignant, small cyst with single septation in the right ovary, likely benign, with recommendation to followup pelvic ultrasound in 6 weeks. UA did not suggest UTI. She also reported intractable nausea, vomiting and had not been holding anything down. It appears that her pain is related to nephrolithiasis. Although UA was negative she is she was shivering, suggesting possible infection and was therefore on placed on Levaquin. She is to follow up with the urology and outpatient setting regarding the nephrolithiasis. She continuous to have pain and in the right flank but needs to go home to attend to a family emergency. I have discharged on Levaquin and recommended for further with her PCP as soon as possible. Of note is that her blood pressure is not optimally controlled and she will need further adjustments of his antihypertensives. Patient was also found to have some ovarian cysts which will need followup with gynecology.   Today   Subjective:   Cynthia Rosales today has no headache,no chest abdominal pain,no new weakness tingling or numbness, feels much better wants to go home today.  Objective:   Blood pressure 140/88, pulse 114, temperature 98.5 F (36.9 C), temperature source Oral, resp. rate 20, height 5\' 3"  (1.6 m), weight 140.61 kg (309 lb 15.8 oz), last menstrual period 03/09/2014, SpO2 99.00%, unknown if currently breastfeeding.   Intake/Output Summary (Last 24 hours) at 03/29/14 1800 Last data filed at 03/29/14 1645  Gross per 24 hour  Intake   7050 ml  Output   2050 ml  Net   5000 ml    Exam Awake Alert, Oriented *3, No new F.N deficits, Normal affect [place].AT,PERRAL Supple Neck,No JVD, No cervical lymphadenopathy appriciated.  Symmetrical Chest wall  movement, Good air movement bilaterally, CTAB RRR,No Gallops,Rubs or new Murmurs, No Parasternal Heave +ve B.Sounds, Abd Soft, Non tender, No organomegaly appriciated, No rebound -guarding or rigidity. No Cyanosis, Clubbing or edema, No new Rash or bruise  Data Review   CBC w Diff: Lab Results  Component Value Date   WBC 7.9 03/29/2014   HGB 11.0* 03/29/2014   HCT 34.8* 03/29/2014   PLT 296 03/29/2014   LYMPHOPCT 36 03/27/2014   MONOPCT 3 03/27/2014   EOSPCT 2 03/27/2014   BASOPCT 0 03/27/2014    CMP: Lab Results  Component Value Date   NA 137 03/29/2014   K 4.3 03/29/2014   CL 100 03/29/2014   CO2 25 03/29/2014   BUN 11 03/29/2014   CREATININE 0.68 03/29/2014   PROT 7.4 03/29/2014   ALBUMIN 2.8* 03/29/2014   BILITOT 0.2* 03/29/2014   ALKPHOS 81 03/29/2014   AST 12 03/29/2014   ALT 10 03/29/2014  .   Total Time in preparing paper work, data evaluation and todays exam - 35 minutes  Nat Math M.D on 03/29/2014 at 6:00 PM  [place]  [phone]

## 2014-03-29 NOTE — Plan of Care (Signed)
Problem: Phase I Progression Outcomes Goal: Initial discharge plan identified Outcome: Completed/Met Date Met:  03/29/14 To return with boyfriend & sons

## 2014-03-29 NOTE — ED Provider Notes (Signed)
Medical screening examination/treatment/procedure(s) were performed by non-physician practitioner and as supervising physician I was immediately available for consultation/collaboration.   Dot Lanes, MD 03/29/14 1324

## 2014-03-29 NOTE — Progress Notes (Signed)
Pt discharged per physician's order. Pt in no s/s of distress. Pt taken to hospital main entrance via wheelchair and nurse tech. Discharge papers reviewed and signed by pt. Prescription given to pt. Skin intact. IV removed - tip intact. Site clean, dry, and intact.

## 2014-03-29 NOTE — Clinical Social Work Psych Note (Signed)
Psych CSW was consulted re: depression.  Full assessment to follow.  Nonnie Done, [place] [phone]  Clinical Social Work

## 2014-04-02 ENCOUNTER — Encounter (HOSPITAL_COMMUNITY): Payer: Self-pay | Admitting: Emergency Medicine

## 2014-04-02 ENCOUNTER — Emergency Department (HOSPITAL_COMMUNITY): Payer: Medicaid Other

## 2014-04-02 ENCOUNTER — Emergency Department (HOSPITAL_COMMUNITY)
Admission: EM | Admit: 2014-04-02 | Discharge: 2014-04-02 | Disposition: A | Discharge status: Home / Self Care | Payer: Medicaid Other | Attending: Emergency Medicine | Admitting: Emergency Medicine

## 2014-04-02 DIAGNOSIS — D571 Sickle-cell disease without crisis: Secondary | ICD-10-CM | POA: Insufficient documentation

## 2014-04-02 DIAGNOSIS — E119 Type 2 diabetes mellitus without complications: Secondary | ICD-10-CM | POA: Insufficient documentation

## 2014-04-02 DIAGNOSIS — Z87891 Personal history of nicotine dependence: Secondary | ICD-10-CM | POA: Insufficient documentation

## 2014-04-02 DIAGNOSIS — Z79899 Other long term (current) drug therapy: Secondary | ICD-10-CM | POA: Insufficient documentation

## 2014-04-02 DIAGNOSIS — N83209 Unspecified ovarian cyst, unspecified side: Secondary | ICD-10-CM | POA: Insufficient documentation

## 2014-04-02 DIAGNOSIS — R109 Unspecified abdominal pain: Secondary | ICD-10-CM

## 2014-04-02 DIAGNOSIS — F3289 Other specified depressive episodes: Secondary | ICD-10-CM | POA: Insufficient documentation

## 2014-04-02 DIAGNOSIS — F329 Major depressive disorder, single episode, unspecified: Secondary | ICD-10-CM | POA: Insufficient documentation

## 2014-04-02 DIAGNOSIS — I10 Essential (primary) hypertension: Secondary | ICD-10-CM | POA: Insufficient documentation

## 2014-04-02 DIAGNOSIS — J45909 Unspecified asthma, uncomplicated: Secondary | ICD-10-CM | POA: Insufficient documentation

## 2014-04-02 DIAGNOSIS — Z794 Long term (current) use of insulin: Secondary | ICD-10-CM | POA: Insufficient documentation

## 2014-04-02 DIAGNOSIS — D649 Anemia, unspecified: Secondary | ICD-10-CM | POA: Insufficient documentation

## 2014-04-02 LAB — CBC WITH DIFFERENTIAL/PLATELET
Basophils Absolute: 0.1 10*3/uL (ref 0.0–0.1)
Basophils Relative: 0 % (ref 0–1)
Eosinophils Absolute: 0.2 10*3/uL (ref 0.0–0.7)
Eosinophils Relative: 1 % (ref 0–5)
HCT: 35.4 % — ABNORMAL LOW (ref 36.0–46.0)
Hemoglobin: 11.7 g/dL — ABNORMAL LOW (ref 12.0–15.0)
Lymphocytes Relative: 36 % (ref 12–46)
Lymphs Abs: 4.3 10*3/uL — ABNORMAL HIGH (ref 0.7–4.0)
MCH: 23.6 pg — ABNORMAL LOW (ref 26.0–34.0)
MCHC: 33.1 g/dL (ref 30.0–36.0)
MCV: 71.5 fL — ABNORMAL LOW (ref 78.0–100.0)
Monocytes Absolute: 0.5 10*3/uL (ref 0.1–1.0)
Monocytes Relative: 4 % (ref 3–12)
Neutro Abs: 6.9 10*3/uL (ref 1.7–7.7)
Neutrophils Relative %: 59 % (ref 43–77)
Platelets: 329 10*3/uL (ref 150–400)
RBC: 4.95 MIL/uL (ref 3.87–5.11)
RDW: 17.7 % — ABNORMAL HIGH (ref 11.5–15.5)
WBC: 11.8 10*3/uL — ABNORMAL HIGH (ref 4.0–10.5)

## 2014-04-02 LAB — URINALYSIS, ROUTINE W REFLEX MICROSCOPIC
Bilirubin Urine: NEGATIVE
Glucose, UA: >1000 mg/dL — AB
Ketones, ur: NEGATIVE mg/dL
Leukocytes, UA: NEGATIVE
Nitrite: NEGATIVE
Protein, ur: 100 mg/dL — AB
Specific Gravity, Urine: 1.028 (ref 1.005–1.030)
Urobilinogen, UA: 1 mg/dL (ref 0.0–1.0)
pH: 6 (ref 5.0–8.0)

## 2014-04-02 LAB — I-STAT CG4 LACTIC ACID, ED: Lactic Acid, Venous: 0.86 mmol/L (ref 0.5–2.2)

## 2014-04-02 LAB — COMPREHENSIVE METABOLIC PANEL
ALT: 12 U/L (ref 0–35)
AST: 16 U/L (ref 0–37)
Albumin: 2.9 g/dL — ABNORMAL LOW (ref 3.5–5.2)
Alkaline Phosphatase: 89 U/L (ref 39–117)
BUN: 11 mg/dL (ref 6–23)
CO2: 24 mEq/L (ref 19–32)
Calcium: 8.9 mg/dL (ref 8.4–10.5)
Chloride: 99 mEq/L (ref 96–112)
Creatinine, Ser: 0.7 mg/dL (ref 0.50–1.10)
GFR calc Af Amer: >90 mL/min (ref >90)
GFR calc non Af Amer: >90 mL/min (ref >90)
Glucose, Bld: 221 mg/dL — ABNORMAL HIGH (ref 70–99)
Potassium: 4.3 mEq/L (ref 3.7–5.3)
Sodium: 135 mEq/L — ABNORMAL LOW (ref 137–147)
Total Bilirubin: 0.3 mg/dL (ref 0.3–1.2)
Total Protein: 7.4 g/dL (ref 6.0–8.3)

## 2014-04-02 LAB — URINE MICROSCOPIC-ADD ON

## 2014-04-02 LAB — WET PREP, GENITAL
Trich, Wet Prep: NONE SEEN
Yeast Wet Prep HPF POC: NONE SEEN

## 2014-04-02 LAB — CBG MONITORING, ED: Glucose-Capillary: 194 mg/dL — ABNORMAL HIGH (ref 70–99)

## 2014-04-02 LAB — PREGNANCY, URINE: Preg Test, Ur: NEGATIVE

## 2014-04-02 MED ORDER — KETOROLAC TROMETHAMINE 60 MG/2ML IM SOLN
60.0000 mg | Freq: Once | INTRAMUSCULAR | Status: AC
  Administered 2014-04-02: 60 mg via INTRAMUSCULAR
  Filled 2014-04-02: qty 2

## 2014-04-02 MED ORDER — ONDANSETRON HCL 4 MG/2ML IJ SOLN
4.0000 mg | INTRAMUSCULAR | Status: DC
  Filled 2014-04-02: qty 2

## 2014-04-02 MED ORDER — ONDANSETRON HCL 4 MG PO TABS
8.0000 mg | ORAL_TABLET | Freq: Once | ORAL | Status: AC
  Administered 2014-04-02: 8 mg via ORAL
  Filled 2014-04-02: qty 2

## 2014-04-02 MED ORDER — HYDROMORPHONE HCL PF 1 MG/ML IJ SOLN
1.0000 mg | Freq: Once | INTRAMUSCULAR | Status: AC
  Administered 2014-04-02: 1 mg via INTRAMUSCULAR
  Filled 2014-04-02: qty 1

## 2014-04-02 MED ORDER — DOXYCYCLINE HYCLATE 100 MG PO CAPS: 100.0000 mg | ORAL_CAPSULE | Freq: Two times a day (BID) | ORAL | Status: DC

## 2014-04-02 MED ORDER — LIDOCAINE HCL (PF) 1 % IJ SOLN
5.0000 mL | Freq: Once | INTRAMUSCULAR | Status: AC
  Administered 2014-04-02: 5 mL
  Filled 2014-04-02: qty 5

## 2014-04-02 MED ORDER — SODIUM CHLORIDE 0.9 % IV BOLUS (SEPSIS): 1000.0000 mL | Freq: Once | INTRAVENOUS | Status: DC

## 2014-04-02 MED ORDER — CEFTRIAXONE SODIUM 250 MG IJ SOLR
250.0000 mg | Freq: Once | INTRAMUSCULAR | Status: AC
Start: 2014-04-02 — End: 2014-04-02
  Administered 2014-04-02: 250 mg via INTRAMUSCULAR
  Filled 2014-04-02: qty 250

## 2014-04-02 MED ORDER — METRONIDAZOLE 500 MG PO TABS: 500.0000 mg | ORAL_TABLET | Freq: Two times a day (BID) | ORAL | Status: DC

## 2014-04-02 MED ORDER — HYDROMORPHONE HCL PF 1 MG/ML IJ SOLN
1.0000 mg | Freq: Once | INTRAMUSCULAR | Status: DC
  Filled 2014-04-02: qty 1

## 2014-04-02 MED ORDER — OXYCODONE-ACETAMINOPHEN 5-325 MG PO TABS: 1.0000 | ORAL_TABLET | ORAL | Status: DC | PRN
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
## 2014-04-02 NOTE — ED Provider Notes (Signed)
Pt received in sign out from PA Humes at shift change.  33 year old female here for evaluation of abdominal pain over the past 2 weeks, worse in bilateral lower quadrants and suprapubic region. Patient does have history of ovarian cysts.  Pelvic exam was obtained with discharge present, right adnexal tenderness, and CMT with concern for PID.  Pt was treated ppx with rocephin and azithromycin in the ED.  Wet prep with moderate clue cells.  Plan:  Pelvic u/s pending for further eval of sx.  If negative, discharge home with flagyl and doxy, GYN FU.  Results for orders placed during the hospital encounter of 04/02/14  WET PREP, GENITAL      Result Value Ref Range   Yeast Wet Prep HPF POC NONE SEEN  NONE SEEN   Trich, Wet Prep NONE SEEN  NONE SEEN   Clue Cells Wet Prep HPF POC MODERATE (*) NONE SEEN   WBC, Wet Prep HPF POC FEW (*) NONE SEEN  COMPREHENSIVE METABOLIC PANEL      Result Value Ref Range   Sodium 135 (*) 137 - 147 mEq/L   Potassium 4.3  3.7 - 5.3 mEq/L   Chloride 99  96 - 112 mEq/L   CO2 24  19 - 32 mEq/L   Glucose, Bld 221 (*) 70 - 99 mg/dL   BUN 11  6 - 23 mg/dL   Creatinine, Ser 0.70  0.50 - 1.10 mg/dL   Calcium 8.9  8.4 - 10.5 mg/dL   Total Protein 7.4  6.0 - 8.3 g/dL   Albumin 2.9 (*) 3.5 - 5.2 g/dL   AST 16  0 - 37 U/L   ALT 12  0 - 35 U/L   Alkaline Phosphatase 89  39 - 117 U/L   Total Bilirubin 0.3  0.3 - 1.2 mg/dL   GFR calc non Af Amer >90  >90 mL/min   GFR calc Af Amer >90  >90 mL/min  CBC WITH DIFFERENTIAL      Result Value Ref Range   WBC 11.8 (*) 4.0 - 10.5 K/uL   RBC 4.95  3.87 - 5.11 MIL/uL   Hemoglobin 11.7 (*) 12.0 - 15.0 g/dL   HCT 35.4 (*) 36.0 - 46.0 %   MCV 71.5 (*) 78.0 - 100.0 fL   MCH 23.6 (*) 26.0 - 34.0 pg   MCHC 33.1  30.0 - 36.0 g/dL   RDW 17.7 (*) 11.5 - 15.5 %   Platelets 329  150 - 400 K/uL   Neutrophils Relative % 59  43 - 77 %   Neutro Abs 6.9  1.7 - 7.7 K/uL   Lymphocytes Relative 36  12 - 46 %   Lymphs Abs 4.3 (*) 0.7 - 4.0 K/uL   Monocytes Relative 4  3 - 12 %   Monocytes Absolute 0.5  0.1 - 1.0 K/uL   Eosinophils Relative 1  0 - 5 %   Eosinophils Absolute 0.2  0.0 - 0.7 K/uL   Basophils Relative 0  0 - 1 %   Basophils Absolute 0.1  0.0 - 0.1 K/uL  URINALYSIS, ROUTINE W REFLEX MICROSCOPIC      Result Value Ref Range   Color, Urine YELLOW  YELLOW   APPearance HAZY (*) CLEAR   Specific Gravity, Urine 1.028  1.005 - 1.030   pH 6.0  5.0 - 8.0   Glucose, UA >1000 (*) NEGATIVE mg/dL   Hgb urine dipstick TRACE (*) NEGATIVE   Bilirubin Urine NEGATIVE  NEGATIVE   Ketones, ur  NEGATIVE  NEGATIVE mg/dL   Protein, ur 100 (*) NEGATIVE mg/dL   Urobilinogen, UA 1.0  0.0 - 1.0 mg/dL   Nitrite NEGATIVE  NEGATIVE   Leukocytes, UA NEGATIVE  NEGATIVE  PREGNANCY, URINE      Result Value Ref Range   Preg Test, Ur NEGATIVE  NEGATIVE  URINE MICROSCOPIC-ADD ON      Result Value Ref Range   Squamous Epithelial / LPF FEW (*) RARE   WBC, UA 0-2  <3 WBC/hpf   RBC / HPF 0-2  <3 RBC/hpf   Bacteria, UA FEW (*) RARE  CBG MONITORING, ED      Result Value Ref Range   Glucose-Capillary 194 (*) 70 - 99 mg/dL   Comment 1 Notify RN    I-STAT CG4 LACTIC ACID, ED      Result Value Ref Range   Lactic Acid, Venous 0.86  0.5 - 2.2 mmol/L   Dg Chest 2 View  03/27/2014   CLINICAL DATA:  right chest pain  EXAM: CHEST  2 VIEW  COMPARISON:  DG ABD ACUTE W/CHEST dated 09/05/2013  FINDINGS: The heart size and mediastinal contours are within normal limits. Both lungs are clear. The visualized skeletal structures are unremarkable.  IMPRESSION: No active cardiopulmonary disease.   Electronically Signed   By: Margaree Mackintosh M.D.   On: 03/27/2014 13:59   US Transvaginal Non-ob  04/02/2014   CLINICAL DATA:  Midline pelvic pain, evaluate for torsion  EXAM: TRANSABDOMINAL AND TRANSVAGINAL ULTRASOUND OF PELVIS  DOPPLER ULTRASOUND OF OVARIES  TECHNIQUE: Both transabdominal and transvaginal ultrasound examinations of the pelvis were performed. Transabdominal  technique was performed for global imaging of the pelvis including uterus, ovaries, adnexal regions, and pelvic cul-de-sac.  It was necessary to proceed with endovaginal exam following the transabdominal exam to visualize the right ovary. Color and duplex Doppler ultrasound was utilized to evaluate blood flow to the ovaries.  COMPARISON:  03/27/2014  FINDINGS: Uterus  Measurements: 9.9 x 4.6 x 5.3 cm. No fibroids or other mass visualized.  Endometrium  Thickness: 6 mm.  No focal abnormality visualized.  Right ovary  Measurements: 3.8 x 3.0 x 3.0 cm. 2.6 x 2.2 x 2.3 cm hemorrhagic involuting corpus luteal cyst.  Left ovary  Measurements: 5.8 x 4.2 x 5.0 cm. 4.9 x 3.9 x 4.0 cm cyst, largely simple, although with mildly irregular margins.  Pulsed Doppler evaluation of both ovaries demonstrates normal low-resistance arterial and venous waveforms.  Other findings  Moderate pelvic ascites.  IMPRESSION: No evidence of ovarian torsion.  4.9 cm left ovarian cyst/follicle, likely physiologic. Follow-up pelvic ultrasound is suggested in 6-10 weeks.  2.6 cm involuting right hemorrhagic corpus luteal cyst.   Electronically Signed   By: Julian Hy M.D.   On: 04/02/2014 18:08   US Transvaginal Non-ob  03/27/2014   CLINICAL DATA:  Pelvic pain.  EXAM: TRANSABDOMINAL AND TRANSVAGINAL ULTRASOUND OF PELVIS  TECHNIQUE: Both transabdominal and transvaginal ultrasound examinations of the pelvis were performed. Transabdominal technique was performed for global imaging of the pelvis including uterus, ovaries, adnexal regions, and pelvic cul-de-sac. It was necessary to proceed with endovaginal exam following the transabdominal exam to visualize the adnexa and ovaries.  COMPARISON:  None  FINDINGS: Uterus  Measurements: 9.8 x 4.2 x 4.8 cm. 1.0 x 0.7 x 0.9 cm cystic region in the lower uterine segment to. This could be fluid. A follow-up pelvic ultrasound is suggested to demonstrate resolution . Fluid is noted in the cervix.   Endometrium  Thickness:  4.1 mm.  No focal abnormality visualized.  Right ovary  Measurements: 3.8 x 2.9 x 2.6 cm. 1.9 x 2.2 x 2.0 cm cyst with a single septation. This is most likely benign.  Left ovary  Measurements: 5.8 x 5.7 x 4.9 cm. 5.2 x 4.5 x 4.3 cm complex cyst left ovary/adnexa. This could be a benign or malignant cystic ovarian mass. Gynecologic evaluation suggested.  Other findings  No free fluid.  IMPRESSION: 1. 5.2 x 4.5 x 4.3 cm complex cystic left ovarian/adnexal mass. This could be a benign or malignant cystic ovarian lesion and gynecologic evaluation suggested. 2. 1.9 x 2.2 x 2.0 cm cyst with a single septation in the right ovary. This is most likely benign. 3. Small probable fluid collection in lower uterine segment with small amount of fluid the cervix. These changes could be cyclical. Follow-up pelvic ultrasound in 6 weeks to demonstrate resolution suggested.   Electronically Signed   By: [place]   On: 03/27/2014 16:04   US Pelvis Complete  04/02/2014   CLINICAL DATA:  Midline pelvic pain, evaluate for torsion  EXAM: TRANSABDOMINAL AND TRANSVAGINAL ULTRASOUND OF PELVIS  DOPPLER ULTRASOUND OF OVARIES  TECHNIQUE: Both transabdominal and transvaginal ultrasound examinations of the pelvis were performed. Transabdominal technique was performed for global imaging of the pelvis including uterus, ovaries, adnexal regions, and pelvic cul-de-sac.  It was necessary to proceed with endovaginal exam following the transabdominal exam to visualize the right ovary. Color and duplex Doppler ultrasound was utilized to evaluate blood flow to the ovaries.  COMPARISON:  03/27/2014  FINDINGS: Uterus  Measurements: 9.9 x 4.6 x 5.3 cm. No fibroids or other mass visualized.  Endometrium  Thickness: 6 mm.  No focal abnormality visualized.  Right ovary  Measurements: 3.8 x 3.0 x 3.0 cm. 2.6 x 2.2 x 2.3 cm hemorrhagic involuting corpus luteal cyst.  Left ovary  Measurements: 5.8 x 4.2 x 5.0 cm. 4.9 x 3.9 x 4.0 cm  cyst, largely simple, although with mildly irregular margins.  Pulsed Doppler evaluation of both ovaries demonstrates normal low-resistance arterial and venous waveforms.  Other findings  Moderate pelvic ascites.  IMPRESSION: No evidence of ovarian torsion.  4.9 cm left ovarian cyst/follicle, likely physiologic. Follow-up pelvic ultrasound is suggested in 6-10 weeks.  2.6 cm involuting right hemorrhagic corpus luteal cyst.   Electronically Signed   By: Julian Hy M.D.   On: 04/02/2014 18:08   Ct Abdomen Pelvis W Contrast  03/27/2014   CLINICAL DATA:  Four day history of lower abdominal pain radiating into lower back  EXAM: CT ABDOMEN AND PELVIS WITH CONTRAST  TECHNIQUE: Multidetector CT imaging of the abdomen and pelvis was performed using the standard protocol following bolus administration of intravenous contrast.  CONTRAST:  163mL OMNIPAQUE IOHEXOL 300 MG/ML  SOLN  COMPARISON:  Acute abdominal series 10 05/2013  FINDINGS: Lower Chest: The lung bases are clear. Visualized cardiac structures are within normal limits for size. No pericardial effusion. Unremarkable visualized distal thoracic esophagus.  Abdomen: Unremarkable CT appearance of the stomach, duodenum, spleen, adrenal glands and pancreas. Normal hepatic contour and morphology. No discrete hepatic lesion. Gallbladder is unremarkable. No intra or extrahepatic biliary ductal dilatation. Unremarkable appearance of the bilateral kidneys. No focal solid lesion, hydronephrosis or nephrolithiasis. On image 52, there is a tiny punctate high attenuation focus within the mid aspect of the right ureter likely representing a tiny, nonobstructing stone in transit.  No evidence of obstruction or focal bowel wall thickening. Normal appendix in the right  lower quadrant. The terminal ileum is unremarkable. No free fluid or suspicious adenopathy.  Pelvis: Dominant 5.5 cm intermediate attenuation cystic structure associated with the left adnexa. The uterus is  unremarkable. There is a smaller 2 cm intermediate attenuation cystic structure affiliated with the right ovary. No free fluid or suspicious adenopathy.  Bones/Soft Tissues: No acute fracture or aggressive appearing lytic or blastic osseous lesion.  Vascular: No significant atherosclerotic vascular disease, aneurysmal dilatation or acute abnormality.  IMPRESSION: 1. Probable tiny punctate, nonobstructing stone in transit in the mid right ureter. No additional nephrolithiasis identified. 2. Dominant 5.5 cm intermediate attenuation cyst associated with the left adnexa. Differential considerations include benign complex ovarian cyst, hemorrhagic cyst, endometrioma, and much less likely an ovarian cystic neoplasm. Recommend further evaluation with transvaginal ultrasound. 3. Smaller 2 cm intermediate attenuation cyst affiliated with the right ovary is almost certainly benign.   Electronically Signed   By: Jacqulynn Cadet M.D.   On: 03/27/2014 14:39   [place] Art/ven Flow Abd Pelv Doppler  04/02/2014   CLINICAL DATA:  Midline pelvic pain, evaluate for torsion  EXAM: TRANSABDOMINAL AND TRANSVAGINAL ULTRASOUND OF PELVIS  DOPPLER ULTRASOUND OF OVARIES  TECHNIQUE: Both transabdominal and transvaginal ultrasound examinations of the pelvis were performed. Transabdominal technique was performed for global imaging of the pelvis including uterus, ovaries, adnexal regions, and pelvic cul-de-sac.  It was necessary to proceed with endovaginal exam following the transabdominal exam to visualize the right ovary. Color and duplex Doppler ultrasound was utilized to evaluate blood flow to the ovaries.  COMPARISON:  03/27/2014  FINDINGS: Uterus  Measurements: 9.9 x 4.6 x 5.3 cm. No fibroids or other mass visualized.  Endometrium  Thickness: 6 mm.  No focal abnormality visualized.  Right ovary  Measurements: 3.8 x 3.0 x 3.0 cm. 2.6 x 2.2 x 2.3 cm hemorrhagic involuting corpus luteal cyst.  Left ovary  Measurements: 5.8 x 4.2 x 5.0 cm. 4.9 x  3.9 x 4.0 cm cyst, largely simple, although with mildly irregular margins.  Pulsed Doppler evaluation of both ovaries demonstrates normal low-resistance arterial and venous waveforms.  Other findings  Moderate pelvic ascites.  IMPRESSION: No evidence of ovarian torsion.  4.9 cm left ovarian cyst/follicle, likely physiologic. Follow-up pelvic ultrasound is suggested in 6-10 weeks.  2.6 cm involuting right hemorrhagic corpus luteal cyst.   Electronically Signed   By: Julian Hy M.D.   On: 04/02/2014 18:08   Dg Abd 2 Views  03/29/2014   CLINICAL DATA:  Abdominal pain, nausea and vomiting  EXAM: ABDOMEN - 2 VIEW  COMPARISON:  CT ABD/PELVIS W CM dated 03/27/2014; DG ABD ACUTE W/CHEST dated 09/05/2013  FINDINGS: Ingested material is seen throughout the colon.  Moderate to large colonic stool burden without evidence of obstruction. No pneumoperitoneum, pneumatosis or portal venous gas.  Several phleboliths overlie the right lower pelvis. Otherwise, no abnormal intra-abdominal calcifications.  Limited visualization of the lower thorax suggest borderline enlargement of the cardiac silhouette, possibly accentuated due to hypoventilation.  No acute osseus abnormalities.  IMPRESSION: Moderate to large colonic stool burden without evidence of obstruction.   Electronically Signed   By: Sandi Mariscal M.D.   On: 03/29/2014 15:37    Ultrasounds negative for ovarian torsion. Patient does have right hemorrhagic cyst present as well as left ovarian cysts-- recommended repeat ultrasound in 6-10 weeks.  Pt states pain has improved after pain meds given in the ED.  Pt will be discharged with percocet, flagyl, and doxycycline.  Advised she will be notified in  the next 24-48 hours if Gc/Chl results are positive.  She will be referred to [facility] hospital for follow-up and repeat ultrasound in 6-10 weeks as recommended per radiology.  Discussed plan with patient, he/she acknowledged understanding and agreed with plan of care.  Return  precautions given for new or worsening symptoms.  Larene Pickett, PA-C 04/02/14 1955

## 2014-04-02 NOTE — ED Notes (Signed)
RN notified [facility] [id number]

## 2014-04-02 NOTE — Discharge Instructions (Signed)
Take the prescribed medication as directed.  Do not take percocet while driving. Follow-up with [facility] outpatient clinic for repeat pelvic ultrasound in 6-10 weeks for monitoring of ovarian cysts. Return to the ED for new or worsening symptoms.

## 2014-04-02 NOTE — ED Notes (Signed)
Patient transported to Ultrasound 

## 2014-04-02 NOTE — ED Notes (Signed)
Pt is moaning and states pain worse than it was before.

## 2014-04-02 NOTE — ED Notes (Addendum)
Was admitted to hospital for kidney stone and ovarian cysts bilaterally Tuesday and discharged Thurs night. States she is still in pain. States she still feels like she has not passed stone. Denies hematuria. States she has been told she has hypertension but does not take meds for it.

## 2014-04-02 NOTE — ED Notes (Signed)
NOTIFIED DR. GHIM OF PATIENTS LAB RESULTS OF CG4+ LACTIC ACID ,@16 : 30 PM ,04/02/2014.

## 2014-04-03 LAB — GC/CHLAMYDIA PROBE AMP
CT Probe RNA: NEGATIVE
GC Probe RNA: NEGATIVE

## 2014-04-04 NOTE — ED Provider Notes (Signed)
Medical screening examination/treatment/procedure(s) were performed by non-physician practitioner and as supervising physician I was immediately available for consultation/collaboration.   EKG Interpretation None       Varney Biles, MD 04/04/14 [phone]

## 2014-04-26 ENCOUNTER — Ambulatory Visit: Payer: Medicaid Other | Admitting: *Deleted

## 2014-05-14 ENCOUNTER — Ambulatory Visit: Payer: Medicaid Other | Admitting: *Deleted

## 2014-06-18 ENCOUNTER — Emergency Department (HOSPITAL_COMMUNITY)
Admission: EM | Admit: 2014-06-18 | Discharge: 2014-06-19 | Disposition: A | Discharge status: Home / Self Care | Payer: Medicaid Other | Attending: Emergency Medicine | Admitting: Emergency Medicine

## 2014-06-18 ENCOUNTER — Encounter (HOSPITAL_COMMUNITY): Payer: Self-pay | Admitting: Emergency Medicine

## 2014-06-18 DIAGNOSIS — J45909 Unspecified asthma, uncomplicated: Secondary | ICD-10-CM | POA: Diagnosis not present

## 2014-06-18 DIAGNOSIS — Z8742 Personal history of other diseases of the female genital tract: Secondary | ICD-10-CM | POA: Insufficient documentation

## 2014-06-18 DIAGNOSIS — Z880 Allergy status to penicillin: Secondary | ICD-10-CM | POA: Diagnosis not present

## 2014-06-18 DIAGNOSIS — Z9889 Other specified postprocedural states: Secondary | ICD-10-CM | POA: Insufficient documentation

## 2014-06-18 DIAGNOSIS — Z87891 Personal history of nicotine dependence: Secondary | ICD-10-CM | POA: Diagnosis not present

## 2014-06-18 DIAGNOSIS — Z8659 Personal history of other mental and behavioral disorders: Secondary | ICD-10-CM | POA: Insufficient documentation

## 2014-06-18 DIAGNOSIS — R112 Nausea with vomiting, unspecified: Secondary | ICD-10-CM | POA: Diagnosis not present

## 2014-06-18 DIAGNOSIS — N3000 Acute cystitis without hematuria: Secondary | ICD-10-CM

## 2014-06-18 DIAGNOSIS — E119 Type 2 diabetes mellitus without complications: Secondary | ICD-10-CM | POA: Diagnosis not present

## 2014-06-18 DIAGNOSIS — Z87442 Personal history of urinary calculi: Secondary | ICD-10-CM | POA: Diagnosis not present

## 2014-06-18 DIAGNOSIS — Z3202 Encounter for pregnancy test, result negative: Secondary | ICD-10-CM | POA: Insufficient documentation

## 2014-06-18 DIAGNOSIS — Z794 Long term (current) use of insulin: Secondary | ICD-10-CM | POA: Diagnosis not present

## 2014-06-18 DIAGNOSIS — Z862 Personal history of diseases of the blood and blood-forming organs and certain disorders involving the immune mechanism: Secondary | ICD-10-CM | POA: Insufficient documentation

## 2014-06-18 DIAGNOSIS — E669 Obesity, unspecified: Secondary | ICD-10-CM | POA: Insufficient documentation

## 2014-06-18 DIAGNOSIS — I10 Essential (primary) hypertension: Secondary | ICD-10-CM | POA: Diagnosis not present

## 2014-06-18 DIAGNOSIS — Z9089 Acquired absence of other organs: Secondary | ICD-10-CM | POA: Diagnosis not present

## 2014-06-18 DIAGNOSIS — R109 Unspecified abdominal pain: Secondary | ICD-10-CM | POA: Diagnosis present

## 2014-06-18 DIAGNOSIS — Z79899 Other long term (current) drug therapy: Secondary | ICD-10-CM | POA: Diagnosis not present

## 2014-06-18 HISTORY — DX: Disorder of kidney and ureter, unspecified: N28.9

## 2014-06-18 HISTORY — DX: Unspecified ovarian cyst, unspecified side: N83.209

## 2014-06-18 LAB — CBC
HCT: 33.9 % — ABNORMAL LOW (ref 36.0–46.0)
Hemoglobin: 11.1 g/dL — ABNORMAL LOW (ref 12.0–15.0)
MCH: 23.3 pg — ABNORMAL LOW (ref 26.0–34.0)
MCHC: 32.7 g/dL (ref 30.0–36.0)
MCV: 71.2 fL — ABNORMAL LOW (ref 78.0–100.0)
Platelets: 377 10*3/uL (ref 150–400)
RBC: 4.76 MIL/uL (ref 3.87–5.11)
RDW: 16.7 % — ABNORMAL HIGH (ref 11.5–15.5)
WBC: 11.2 10*3/uL — ABNORMAL HIGH (ref 4.0–10.5)

## 2014-06-18 LAB — COMPREHENSIVE METABOLIC PANEL
ALT: 11 U/L (ref 0–35)
AST: 12 U/L (ref 0–37)
Albumin: 3 g/dL — ABNORMAL LOW (ref 3.5–5.2)
Alkaline Phosphatase: 95 U/L (ref 39–117)
Anion gap: 13 (ref 5–15)
BUN: 13 mg/dL (ref 6–23)
CO2: 24 mEq/L (ref 19–32)
Calcium: 9.1 mg/dL (ref 8.4–10.5)
Chloride: 96 mEq/L (ref 96–112)
Creatinine, Ser: 0.8 mg/dL (ref 0.50–1.10)
GFR calc Af Amer: >90 mL/min (ref >90)
GFR calc non Af Amer: >90 mL/min (ref >90)
Glucose, Bld: 249 mg/dL — ABNORMAL HIGH (ref 70–99)
Potassium: 4 mEq/L (ref 3.7–5.3)
Sodium: 133 mEq/L — ABNORMAL LOW (ref 137–147)
Total Bilirubin: 0.2 mg/dL — ABNORMAL LOW (ref 0.3–1.2)
Total Protein: 7.4 g/dL (ref 6.0–8.3)

## 2014-06-18 LAB — CBG MONITORING, ED: Glucose-Capillary: 203 mg/dL — ABNORMAL HIGH (ref 70–99)

## 2014-06-18 NOTE — ED Notes (Signed)
Pt unable to void at this time. 

## 2014-06-18 NOTE — ED Notes (Signed)
Pt arrives to ER via EMS for complaint of flank pain; pt states that she has had flank pain left side greater than right for 4 days; pt c/o N/V this pm; pt states that she has a hx of kidney stones and that this feel similar; pt also c/o numbness to bilateral toes and rt shin; pt states that this happens intermittently for the last few months; pt states that she thinks it is related to her diabetes or her sickle cell

## 2014-06-18 NOTE — ED Notes (Signed)
Patient complains of numbness and loss of sensation in both feet and aching pains in her legs.

## 2014-06-19 ENCOUNTER — Emergency Department (HOSPITAL_COMMUNITY): Payer: Medicaid Other

## 2014-06-19 LAB — URINE MICROSCOPIC-ADD ON

## 2014-06-19 LAB — URINALYSIS, ROUTINE W REFLEX MICROSCOPIC
Bilirubin Urine: NEGATIVE
Glucose, UA: >1000 mg/dL — AB
Ketones, ur: NEGATIVE mg/dL
Leukocytes, UA: NEGATIVE
Nitrite: POSITIVE — AB
Protein, ur: 100 mg/dL — AB
Specific Gravity, Urine: 1.03 (ref 1.005–1.030)
Urobilinogen, UA: 1 mg/dL (ref 0.0–1.0)
pH: 6 (ref 5.0–8.0)

## 2014-06-19 LAB — POC URINE PREG, ED: Preg Test, Ur: NEGATIVE

## 2014-06-19 MED ORDER — HYDROMORPHONE HCL PF 1 MG/ML IJ SOLN
1.0000 mg | Freq: Once | INTRAMUSCULAR | Status: AC
  Administered 2014-06-19: 1 mg via INTRAVENOUS
  Filled 2014-06-19: qty 1

## 2014-06-19 MED ORDER — CEPHALEXIN 500 MG PO CAPS: 500.0000 mg | ORAL_CAPSULE | Freq: Three times a day (TID) | ORAL | Status: DC

## 2014-06-19 MED ORDER — HYDROMORPHONE HCL PF 1 MG/ML IJ SOLN
1.0000 mg | Freq: Once | INTRAMUSCULAR | Status: AC
Start: 2014-06-19 — End: 2014-06-19
  Administered 2014-06-19: 1 mg via INTRAVENOUS
  Filled 2014-06-19: qty 1

## 2014-06-19 MED ORDER — ONDANSETRON 8 MG PO TBDP: 8.0000 mg | ORAL_TABLET | Freq: Three times a day (TID) | ORAL | Status: DC | PRN

## 2014-06-19 MED ORDER — ONDANSETRON HCL 4 MG/2ML IJ SOLN
4.0000 mg | Freq: Once | INTRAMUSCULAR | Status: AC
  Administered 2014-06-19: 4 mg via INTRAVENOUS
  Filled 2014-06-19: qty 2

## 2014-06-19 MED ORDER — HYDROCODONE-ACETAMINOPHEN 5-325 MG PO TABS: 1.0000 | ORAL_TABLET | ORAL | Status: DC | PRN

## 2014-06-19 MED ORDER — CEPHALEXIN 500 MG PO CAPS
500.0000 mg | ORAL_CAPSULE | Freq: Once | ORAL | Status: AC
  Administered 2014-06-19: 500 mg via ORAL
  Filled 2014-06-19: qty 1

## 2014-06-19 NOTE — ED Provider Notes (Signed)
CSN: [id number]     Arrival date & time 06/18/14  2204 History   First MD Initiated Contact with Patient 06/18/14 2333     Chief Complaint  Patient presents with  . Flank Pain     (Consider location/radiation/quality/duration/timing/severity/associated sxs/prior Treatment) The history is provided by the patient and the EMS personnel.   patient reports bilateral flank pain left greater than right over the past 4 days.  She's had nausea with vomiting.  She denies diarrhea.  No fevers or chills.  She says she has a history of kidney stones and states this feels similar.  She denies dysuria or urinary frequency.  Denies upper abdominal pain.  No chest pain shortness of breath.  Pain is moderate in severity at this time.  Nothing worsens or improves her pain.  Past Medical History  Diagnosis Date  . Hypertension   . Asthma   . Type II diabetes mellitus   . History of blood transfusion     "after I had one of my kids"  . Sickle cell disease   . Anemia   . Depression   . Ovarian cyst   . Renal disorder     kidney stones   Past Surgical History  Procedure Laterality Date  . Cesarean section  2000; 2007; 2011  . Dilation and curettage of uterus    . Appendectomy  2013  . Reduction mammaplasty Bilateral 1998   No family history on file. History  Substance Use Topics  . Smoking status: Former Smoker -- 1 years    Types: Cigarettes  . Smokeless tobacco: Never Used     Comment: 03/27/2014 "smoked ~ 1 cigarette/day; quit in ~ 2013"  . Alcohol Use: No   OB History   Grav Para Term Preterm Abortions TAB SAB Ect Mult Living   1              Review of Systems  Genitourinary: Positive for flank pain.  All other systems reviewed and are negative.     Allergies  Amoxicillin  Home Medications   Prior to Admission medications   Medication Sig Start Date End Date Taking? Authorizing Provider  exenatide (BYETTA) 5 MCG/0.02ML SOPN injection Inject 5 mcg into the skin 2 (two) times  daily with a meal.   Yes Historical Provider, MD  insulin aspart (NOVOLOG) 100 UNIT/ML injection Inject 20 Units into the skin 3 (three) times daily.    Yes Historical Provider, MD  insulin glargine (LANTUS) 100 UNIT/ML injection Inject 40 Units into the skin 2 (two) times daily.    Yes Historical Provider, MD  metFORMIN (GLUCOPHAGE) 1000 MG tablet Take 1,000 mg by mouth 2 (two) times daily.   Yes Historical Provider, MD   BP 141/76  Pulse 93  Temp(Src) 98.2 F (36.8 C) (Oral)  Resp 18  SpO2 98%  LMP 05/28/2014 Physical Exam  Nursing note and vitals reviewed. Constitutional: She is oriented to person, place, and time. She appears well-developed and well-nourished. No distress.  Obese  HENT:  Head: Normocephalic and atraumatic.  Eyes: EOM are normal.  Neck: Normal range of motion.  Cardiovascular: Normal rate, regular rhythm and normal heart sounds.   Pulmonary/Chest: Effort normal and breath sounds normal.  Abdominal: Soft. She exhibits no distension. There is no tenderness.  Musculoskeletal: Normal range of motion.  Neurological: She is alert and oriented to person, place, and time.  Skin: Skin is warm and dry.  Psychiatric: She has a normal mood and affect. Judgment normal.  ED Course  Procedures (including critical care time) Labs Review Labs Reviewed  CBC - Abnormal; Notable for the following:    WBC 11.2 (*)    Hemoglobin 11.1 (*)    HCT 33.9 (*)    MCV 71.2 (*)    MCH 23.3 (*)    RDW 16.7 (*)    All other components within normal limits  COMPREHENSIVE METABOLIC PANEL - Abnormal; Notable for the following:    Sodium 133 (*)    Glucose, Bld 249 (*)    Albumin 3.0 (*)    Total Bilirubin 0.2 (*)    All other components within normal limits  URINALYSIS, ROUTINE W REFLEX MICROSCOPIC - Abnormal; Notable for the following:    APPearance CLOUDY (*)    Glucose, UA >1000 (*)    Hgb urine dipstick SMALL (*)    Protein, ur 100 (*)    Nitrite POSITIVE (*)    All other  components within normal limits  URINE MICROSCOPIC-ADD ON - Abnormal; Notable for the following:    Squamous Epithelial / LPF FEW (*)    Bacteria, UA MANY (*)    All other components within normal limits  CBG MONITORING, ED - Abnormal; Notable for the following:    Glucose-Capillary 203 (*)    All other components within normal limits  CBG MONITORING, ED  POC URINE PREG, ED    Imaging Review Ct Abdomen Pelvis Wo Contrast  06/19/2014   CLINICAL DATA:  Bilateral flank pain, worse on the left. Right lower quadrant abdominal pain. Bilateral foot numbness. Leukocytosis.  EXAM: CT ABDOMEN AND PELVIS WITHOUT CONTRAST  TECHNIQUE: Multidetector CT imaging of the abdomen and pelvis was performed following the standard protocol without IV contrast.  COMPARISON:  CT of the abdomen and pelvis from 03/27/2014, and pelvic ultrasound performed 04/02/2014  FINDINGS: The visualized lung bases are clear.  The liver and spleen are unremarkable in appearance. The gallbladder is within normal limits. The pancreas and adrenal glands are unremarkable.  There is question of a tiny stone at the interpole region of the left kidney, too small to further characterize. The kidneys are otherwise unremarkable. There is no evidence of hydronephrosis. No obstructing ureteral stones are seen. No perinephric stranding is appreciated.  No free fluid is identified. The small bowel is unremarkable in appearance. The stomach is within normal limits. No acute vascular abnormalities are seen.  The patient is status post appendectomy. The colon is unremarkable in appearance.  The bladder is decompressed and not well assessed. The uterus is grossly unremarkable in appearance. The ovaries are relatively symmetric, though difficult to fully assess. No suspicious adnexal masses are seen. No inguinal lymphadenopathy is seen.  No acute osseous abnormalities are identified. Vacuum phenomenon is noted at the sacroiliac joints.  IMPRESSION: 1. No acute  abnormality seen within the abdomen or pelvis. 2. Question of tiny nonobstructing stone at the interpole region of the left kidney.   Electronically Signed   By: Garald Balding M.D.   On: 06/19/2014 01:40     EKG Interpretation   Date/Time:  Monday June 18 2014 22:17:44 EDT Ventricular Rate:  93 PR Interval:  140 QRS Duration: 82 QT Interval:  348 QTC Calculation: 433 R Axis:   78 Text Interpretation:  Sinus rhythm No old tracing to compare Confirmed by  BELFI  MD, MELANIE ([id number]) on 06/18/2014 11:51:32 PM      MDM   Final diagnoses:  Acute cystitis without hematuria  Nausea and vomiting, vomiting of unspecified  type    Patient is feeling better at this time.  Patient be discharged home with pain medication, nausea medicine, antibiotics.  Urinary tract infection.  Given her nausea vomiting this could represent developing pyelonephritis.  Outpatient trial will be given.  Urine culture pending    Hoy Morn, MD 06/19/14 [phone]

## 2014-06-19 NOTE — Discharge Instructions (Signed)

## 2014-06-21 ENCOUNTER — Telehealth (HOSPITAL_BASED_OUTPATIENT_CLINIC_OR_DEPARTMENT_OTHER): Payer: Self-pay | Admitting: Emergency Medicine

## 2014-06-21 ENCOUNTER — Emergency Department (HOSPITAL_COMMUNITY): Payer: Medicaid Other

## 2014-06-21 ENCOUNTER — Emergency Department (HOSPITAL_COMMUNITY)
Admission: EM | Admit: 2014-06-21 | Discharge: 2014-06-21 | Disposition: A | Discharge status: Home / Self Care | Payer: Medicaid Other | Attending: Emergency Medicine | Admitting: Emergency Medicine

## 2014-06-21 ENCOUNTER — Encounter (HOSPITAL_COMMUNITY): Payer: Self-pay | Admitting: Emergency Medicine

## 2014-06-21 DIAGNOSIS — Z87891 Personal history of nicotine dependence: Secondary | ICD-10-CM | POA: Insufficient documentation

## 2014-06-21 DIAGNOSIS — Z794 Long term (current) use of insulin: Secondary | ICD-10-CM | POA: Insufficient documentation

## 2014-06-21 DIAGNOSIS — N390 Urinary tract infection, site not specified: Secondary | ICD-10-CM | POA: Insufficient documentation

## 2014-06-21 DIAGNOSIS — R109 Unspecified abdominal pain: Secondary | ICD-10-CM

## 2014-06-21 DIAGNOSIS — N12 Tubulo-interstitial nephritis, not specified as acute or chronic: Secondary | ICD-10-CM | POA: Diagnosis not present

## 2014-06-21 DIAGNOSIS — D571 Sickle-cell disease without crisis: Secondary | ICD-10-CM | POA: Diagnosis not present

## 2014-06-21 DIAGNOSIS — J45909 Unspecified asthma, uncomplicated: Secondary | ICD-10-CM | POA: Diagnosis not present

## 2014-06-21 DIAGNOSIS — I10 Essential (primary) hypertension: Secondary | ICD-10-CM | POA: Insufficient documentation

## 2014-06-21 DIAGNOSIS — Z8659 Personal history of other mental and behavioral disorders: Secondary | ICD-10-CM | POA: Diagnosis not present

## 2014-06-21 DIAGNOSIS — Z8742 Personal history of other diseases of the female genital tract: Secondary | ICD-10-CM | POA: Diagnosis not present

## 2014-06-21 DIAGNOSIS — Z87442 Personal history of urinary calculi: Secondary | ICD-10-CM | POA: Insufficient documentation

## 2014-06-21 DIAGNOSIS — Z792 Long term (current) use of antibiotics: Secondary | ICD-10-CM | POA: Diagnosis not present

## 2014-06-21 DIAGNOSIS — E119 Type 2 diabetes mellitus without complications: Secondary | ICD-10-CM | POA: Diagnosis not present

## 2014-06-21 DIAGNOSIS — Z79899 Other long term (current) drug therapy: Secondary | ICD-10-CM | POA: Insufficient documentation

## 2014-06-21 DIAGNOSIS — Z880 Allergy status to penicillin: Secondary | ICD-10-CM | POA: Diagnosis not present

## 2014-06-21 LAB — COMPREHENSIVE METABOLIC PANEL
ALT: 12 U/L (ref 0–35)
AST: 11 U/L (ref 0–37)
Albumin: 3 g/dL — ABNORMAL LOW (ref 3.5–5.2)
Alkaline Phosphatase: 92 U/L (ref 39–117)
Anion gap: 12 (ref 5–15)
BUN: 12 mg/dL (ref 6–23)
CO2: 24 mEq/L (ref 19–32)
Calcium: 9.2 mg/dL (ref 8.4–10.5)
Chloride: 98 mEq/L (ref 96–112)
Creatinine, Ser: 0.66 mg/dL (ref 0.50–1.10)
GFR calc Af Amer: >90 mL/min (ref >90)
GFR calc non Af Amer: >90 mL/min (ref >90)
Glucose, Bld: 311 mg/dL — ABNORMAL HIGH (ref 70–99)
Potassium: 4.3 mEq/L (ref 3.7–5.3)
Sodium: 134 mEq/L — ABNORMAL LOW (ref 137–147)
Total Bilirubin: <0.2 mg/dL — ABNORMAL LOW (ref 0.3–1.2)
Total Protein: 7.5 g/dL (ref 6.0–8.3)

## 2014-06-21 LAB — URINE CULTURE: Colony Count: >=100000

## 2014-06-21 LAB — URINALYSIS, ROUTINE W REFLEX MICROSCOPIC
Bilirubin Urine: NEGATIVE
Glucose, UA: >1000 mg/dL — AB
Ketones, ur: NEGATIVE mg/dL
Leukocytes, UA: NEGATIVE
Nitrite: NEGATIVE
Protein, ur: 100 mg/dL — AB
Specific Gravity, Urine: 1.035 — ABNORMAL HIGH (ref 1.005–1.030)
Urobilinogen, UA: 1 mg/dL (ref 0.0–1.0)
pH: 5.5 (ref 5.0–8.0)

## 2014-06-21 LAB — CBC WITH DIFFERENTIAL/PLATELET
Basophils Absolute: 0.1 10*3/uL (ref 0.0–0.1)
Basophils Relative: 1 % (ref 0–1)
Eosinophils Absolute: 0.1 10*3/uL (ref 0.0–0.7)
Eosinophils Relative: 1 % (ref 0–5)
HCT: 34 % — ABNORMAL LOW (ref 36.0–46.0)
Hemoglobin: 11.1 g/dL — ABNORMAL LOW (ref 12.0–15.0)
Lymphocytes Relative: 34 % (ref 12–46)
Lymphs Abs: 2.9 10*3/uL (ref 0.7–4.0)
MCH: 23.3 pg — ABNORMAL LOW (ref 26.0–34.0)
MCHC: 32.6 g/dL (ref 30.0–36.0)
MCV: 71.3 fL — ABNORMAL LOW (ref 78.0–100.0)
Monocytes Absolute: 0.4 10*3/uL (ref 0.1–1.0)
Monocytes Relative: 4 % (ref 3–12)
Neutro Abs: 5.1 10*3/uL (ref 1.7–7.7)
Neutrophils Relative %: 60 % (ref 43–77)
Platelets: 371 10*3/uL (ref 150–400)
RBC: 4.77 MIL/uL (ref 3.87–5.11)
RDW: 16.7 % — ABNORMAL HIGH (ref 11.5–15.5)
WBC: 8.5 10*3/uL (ref 4.0–10.5)

## 2014-06-21 LAB — URINE MICROSCOPIC-ADD ON

## 2014-06-21 LAB — LIPASE, BLOOD: Lipase: 34 U/L (ref 11–59)

## 2014-06-21 MED ORDER — KETOROLAC TROMETHAMINE 30 MG/ML IJ SOLN
30.0000 mg | Freq: Once | INTRAMUSCULAR | Status: AC
  Administered 2014-06-21: 30 mg via INTRAVENOUS
  Filled 2014-06-21: qty 1

## 2014-06-21 MED ORDER — ONDANSETRON HCL 4 MG/2ML IJ SOLN
4.0000 mg | Freq: Once | INTRAMUSCULAR | Status: AC
  Administered 2014-06-21: 4 mg via INTRAVENOUS
  Filled 2014-06-21: qty 2

## 2014-06-21 MED ORDER — OXYCODONE-ACETAMINOPHEN 5-325 MG PO TABS: 1.0000 | ORAL_TABLET | ORAL | Status: DC | PRN

## 2014-06-21 MED ORDER — SODIUM CHLORIDE 0.9 % IV BOLUS (SEPSIS)
1000.0000 mL | Freq: Once | INTRAVENOUS | Status: AC
  Administered 2014-06-21: 1000 mL via INTRAVENOUS

## 2014-06-21 MED ORDER — MORPHINE SULFATE 4 MG/ML IJ SOLN
6.0000 mg | Freq: Once | INTRAMUSCULAR | Status: AC
  Administered 2014-06-21: 6 mg via INTRAVENOUS
  Filled 2014-06-21: qty 2

## 2014-06-21 MED ORDER — HYDROMORPHONE HCL PF 1 MG/ML IJ SOLN
1.0000 mg | Freq: Once | INTRAMUSCULAR | Status: AC
  Administered 2014-06-21: 1 mg via INTRAVENOUS
  Filled 2014-06-21: qty 1

## 2014-06-21 NOTE — ED Provider Notes (Signed)
MSE was initiated and I personally evaluated the patient and placed orders (if any) at  7:07 PM on June 21, 2014.  Cynthia Rosales is a 34 y.o. female who presents to the Emergency Department complaining of right radiating flank pain onset 3 days ago. She states that the flank pain radiates to the middle of her back. She states that the pain is intermittent. She states that she was here at [facility] three days ago and dx with UTI. She states that she was called today by the Flow Manager and told to come to the ED for blood cultures. She states that she is having associated symptoms of nausea, vomiting, and abdominal pain. She states that the abdominal pain has been present for the past week. She states that she last vomited this morning. She states that for the last couple days she was not able to keep down any food or fluids. She states that she was given Hydrocodone and told to take 1 pill every 4 hours. She states that she has taken 2 pills every 3 hours with no relief for her symptoms. She states that there has not been a change in foods recently.   The patient appears stable so that the remainder of the MSE may be completed by another provider.  Carlisle Cater, PA-C 06/21/14 1910

## 2014-06-21 NOTE — ED Notes (Signed)
Pt vomited about 300 cc in vomit bag, states vomited after trying to drink ginger ale, new vomit bag given and gown changed. Pt states she feels dizzy.

## 2014-06-21 NOTE — Telephone Encounter (Deleted)
Post ED Visit - Positive Culture Follow-up: Successful Patient Follow-Up  Culture assessed and recommendations reviewed by: []  Wes Dent, Pharm.D., BCPS []  Heide Guile, Pharm.D., BCPS []  Alycia Rossetti, Pharm.D., BCPS [x]  [place], Pharm.D., BCPS, AAHIVP []  Legrand Como, Pharm.D., BCPS, AAHIVP  Positive urine culture  []  Patient discharged without antimicrobial prescription and treatment is now indicated []  Organism is resistant to prescribed ED discharge antimicrobial []  Patient with positive blood cultures  Changes discussed with ED provider: *** New antibiotic prescription *** Called to ***  Contacted patient, date ***, time ***   Cynthia Rosales 06/21/2014, 3:26 PM

## 2014-06-21 NOTE — ED Notes (Signed)
Pt seen here 3 days ago. Dx with UTI, early pyelo. Called by flow manager today, told to come to ED for blood cultures. Per chart review, pt's urine culture showed staph. Pt reports continuing R flank pain. Sts pain meds she was sent home with are not working. Still taking keflex as prescribed.

## 2014-06-21 NOTE — ED Notes (Signed)
Bed: BF38 Expected date:  Expected time:  Means of arrival:  Comments: Triage 8

## 2014-06-21 NOTE — Discharge Instructions (Signed)
Read the information below.  Use the prescribed medication as directed.  Please discuss all new medications with your pharmacist.  Do not take additional tylenol while taking the prescribed pain medication to avoid overdose.  You may return to the Emergency Department at any time for worsening condition or any new symptoms that concern you.  If you develop high fevers, worsening abdominal pain, uncontrolled vomiting, or are unable to tolerate fluids by mouth, return to the ER for a recheck.     Flank Pain Flank pain refers to pain that is located on the side of the body between the upper abdomen and the back. The pain may occur over a short period of time (acute) or may be long-term or reoccurring (chronic). It may be mild or severe. Flank pain can be caused by many things. CAUSES  Some of the more common causes of flank pain include:  Muscle strains.   Muscle spasms.   A disease of your spine (vertebral disk disease).   A lung infection (pneumonia).   Fluid around your lungs (pulmonary edema).   A kidney infection.   Kidney stones.   A very painful skin rash caused by the chickenpox virus (shingles).   Gallbladder disease.  [place] care will depend on the cause of your pain. In general,  Rest as directed by your caregiver.  Drink enough fluids to keep your urine clear or pale yellow.  Only take over-the-counter or prescription medicines as directed by your caregiver. Some medicines may help relieve the pain.  Tell your caregiver about any changes in your pain.  Follow up with your caregiver as directed. SEEK IMMEDIATE MEDICAL CARE IF:   Your pain is not controlled with medicine.   You have new or worsening symptoms.  Your pain increases.   You have abdominal pain.   You have shortness of breath.   You have persistent nausea or vomiting.   You have swelling in your abdomen.   You feel faint or pass out.   You have blood in  your urine.  You have a fever or persistent symptoms for more than 2-3 days.  You have a fever and your symptoms suddenly get worse. MAKE SURE YOU:   Understand these instructions.  Will watch your condition.  Will get help right away if you are not doing well or get worse. Document Released: 01/07/2006 Document Revised: 08/10/2012 Document Reviewed: 06/30/2012 [facility] Patient Information 2015 [place], [place]. This information is not intended to replace advice given to you by your health care provider. Make sure you discuss any questions you have with your health care provider.    Emergency Department Resource Guide 1) Find a Doctor and Pay Out of Pocket Although you won't have to find out who is covered by your insurance plan, it is a good idea to ask around and get recommendations. You will then need to call the office and see if the doctor you have chosen will accept you as a new patient and what types of options they offer for patients who are self-pay. Some doctors offer discounts or will set up payment plans for their patients who do not have insurance, but you will need to ask so you aren't surprised when you get to your appointment.  2) Contact Your Local Health Department Not all health departments have doctors that can see patients for sick visits, but many do, so it is worth a call to see if yours does. If you don't know where  your local health department is, you can check in your phone book. The CDC also has a tool to help you locate your state's health department, and many state websites also have listings of all of their local health departments.  3) Find a [facility] If your illness is not likely to be very severe or complicated, you may want to try a walk in clinic. These are popping up all over the country in pharmacies, drugstores, and shopping centers. They're usually staffed by nurse practitioners or physician assistants that have been trained to treat common  illnesses and complaints. They're usually fairly quick and inexpensive. However, if you have serious medical issues or chronic medical problems, these are probably not your best option.  No Primary Care Doctor: - Call Health Connect at  [phone] - they can help you locate a primary care doctor that  accepts your insurance, provides certain services, etc. - Physician Referral Service- [phone]  Chronic Pain Problems: Organization         Address  Phone   Notes  [facility]  [phone] Patients need to be referred by their primary care doctor.   Medication Assistance: Organization         Address  Phone   Notes  [facility] Medication Telecare [facility] [address] [place], [place] [address] [phone] --Must be a resident of [facility] -- Must have NO insurance coverage whatsoever (no Medicaid/ Medicare, etc.) -- The pt. MUST have a primary care doctor that directs their care regularly and follows them in the community   Med[facility]ist  [phone]   [employer]  9347643787    Agencies that provide inexpensive medical care: Organization         Address  Phone   Notes  [place]  [phone]   [facility]    [phone]   [facility] [address], [place] [address] [phone]   [place] [address], [place] [phone]   Planned Parenthood    [phone]   [facility]    [phone]   [place] and [place] [address], [place] Phone:  [phone], Fax:  [phone] Hours of Operation:  9 am - 6 pm, M-F.  Also accepts Medicaid/Medicare and self-pay.  [facility] for [place] [address], [address], [place] Phone: [phone], Fax: [phone]. Hours of Operation:  8:30 am - 5:30 pm, M-F.  Also accepts Medicaid and self-pay.  [facility] [place]  [address] [place] Phone: [phone]   [place], [place], [place] [phone], Ext. [phone] Mondays & Thursdays: 7-9 AM.  First 15 patients are seen on a first come, first serve basis.    [place] Providers:  Organization         Address  Phone   Notes  [facility] [address],  [phone] Also accepts self-pay patients.  [place], [place]  [phone]   [place], [address], [place] [phone]   [facility] [address] [place] [phone]   Lucianne Lei [address], [place]   [phone] Only accepts [place] Access [place] patients  after they have their name applied to their card.   Self-Pay (no insurance) in [facility]:  Organization         Address  Phone   Notes  Sickle Cell Patients, [facility] Internal Medicine [place] [phone]   [facility] [place] [phone]   [facility] [place]  [place], [place], [place] [phone]   [facility]/Dr. Osei-Bonsu  [address], [place] or [address], [place] [phone] Phone number for both [place] and [place] locations is the same.  [facility]gent Medical [facility] [address] [place] [phone]   [facility] [address] [place] or [address] [phone]   [facility] [address] [place] [phone], phone; [phone], fax Sees patients 1st and 3rd Saturday of every month.  Must not qualify for public or private insurance (i.e. Medicaid, Medicare, [place] Health Choice, Veterans' Benefits)  Household income should be no more than 200% of the  poverty level The clinic cannot treat you if you are pregnant or think you are pregnant  Sexually transmitted diseases are not treated at the clinic.    Dental Care: Organization         Address  Phone  Notes  [facility] Department of [facility] [place] [phone] Accepts children up to age 48 who are enrolled in [place] or [place]; pregnant women with a Medicaid card; and children who have applied for Medicaid or [place] Health Choice, but were declined, whose parents can pay a reduced fee at time of service.  [facility] Department of [facility]  [address], [place] [phone] Accepts children up to age 11 who are enrolled in [place] or [place]; pregnant women with a Medicaid card; and children who have applied for Medicaid or [place] Health Choice, but were declined, whose parents can pay a reduced fee at time of service.  [place] Adult Dental Access PROGRAM  [place] [phone] Patients are seen by appointment only. Walk-ins are not accepted. [place] will see patients 50 years of age and older. Monday - Tuesday (8am-5pm) Most Wednesdays (8:30-5pm) $30 per visit, cash only  [facility] Adult Dental Access PROGRAM  [address], [facility] [phone] Patients are seen by appointment only. Walk-ins are not accepted. [place] will see patients 57 years of age and older. One Wednesday Evening (Monthly: Volunteer Based).  $30 per visit, cash only  [place]  [phone] for adults; Children under age 73, call Graduate Pediatric Dentistry at [phone]. Children aged 40-14, please call [phone] to request a pediatric application.  Dental services are provided in all areas of dental care including fillings, crowns and bridges, complete and partial dentures, implants, gum treatment, root canals, and extractions.  Preventive care is also provided. Treatment is provided to both adults and children. Patients are selected via a lottery and there is often a waiting list.   [facility] [address], [place]  [phone] [email or web address], [place], [place] [phone], Ext. [phone] Second and Fourth Thursday of each month, opens at 6:30 AM; Clinic ends at 9 AM.  Patients  are seen on a first-come first-served basis, and a limited number are seen during each clinic.   [facility]  [address] Hillard Danker [place], [place] [phone]   Eligibility Requirements You must have lived in [place], [place], or [place] counties for at least the last three months.   You cannot be eligible for state or federal sponsored [employer], including [employer], [place], or [employer].   You generally cannot be eligible for healthcare insurance through your employer.    How to apply: Eligibility screenings are held every Tuesday and Wednesday afternoon from 1:00 pm until 4:00 pm. You do not need an appointment for the interview!  [facility] [address], [place], [place]   [place]  [place] Department  [place]  [phone]    Behavioral Health Resources in the Community: Intensive Outpatient Programs Organization         Address  Phone  Notes  [place]. [address] [place], [place] [phone]   [facility] [address] [place], [place]   ADS: Alcohol & Drug Svcs [address] [place], [place]   [place] [address],  [place], [place] or [phone]   Substance Abuse Resources Organization         Address  Phone  Notes  Alcohol and Drug Services  [phone]   [place]  [phone]   [facility] [place]   Chinita Pester  [phone]   Residential & Outpatient Substance Abuse Program  [phone]   Psychological Services Organization         Address  Phone  Notes  [facility] [place]  [place]  [phone]   [place] [address] [place] or [phone]    Mobile Crisis Teams Organization         Address  Phone  Notes  [facility]rapeutic [facility]atives, Mobile Crisis Care Unit  [phone]   [facility] Psychotherapeutic Services  [address] [place], [place]   [place] [address] [place] [phone]    Self-Help/Support Groups Organization         Address  Phone             Notes  [employer] of [place] - variety of support groups  [place] Call for more information  Narcotics Anonymous (NA), Caring Services [address], [employer] [place]  2 meetings at this location   Special educational needs teacher         Address  Phone  Notes  ASAP Residential Treatment [place],    [place]  [phone]   [facility]  [address] [place] [phone], [place], [place]   [place], [place] [phone] Admissions: 8am-3pm M-F  Incentives Substance [place] [place] [address],    [place], [place] [phone]   [facility] [address] Jadene Pierini [place], [place]   [facility] [address]  [place], [place]   [facility] Programs - Intensive Outpatient [address], [place], [place] [phone]   [facility].,  [place], [place] or [phone]   Residential Treatment Services (RTS) [address]  [place], [place] Accepts Medicaid  [facility] [place] [address],  [place]  [phone] Substance Abuse/Addiction Treatment   [facility] Organization         Address  Phone  Notes  [facility]  [phone]   Domenic Schwab, PhD [address] [place], [place]   [phone] or [phone]   [place], [place] [phone]   [address], [place], [place] [phone] Insurance/Medicaid/sponsorship through [facility] and Families [address], [address] [place]                                    [place], [place] [phone] [place] [address] [place], [place] [phone]    Dr. Adele Schilder  [phone]   [facility] Dept. 1) [address] [place] 2) [place] 3)  [place] [address], [place] [phone]  [phone]   [place] [phone] or [phone] (After Hours)

## 2014-06-21 NOTE — ED Notes (Signed)
Initial contact - pt a+ox4, reports was recently in ED dx with UTI, called today and told to return to ED for blood cultures.  Pt denies new changes, reports continued flank pain.  Pt reports pain medications given are not helping, pt reports still taking abx.  Skin PWD.  MAEI.  NAD.

## 2014-06-21 NOTE — ED Provider Notes (Signed)
CSN: [id number]     Arrival date & time 06/21/14  1739 History   First MD Initiated Contact with Patient 06/21/14 1831     Chief Complaint  Patient presents with  . Labs Only  . Urinary Tract Infection     (Consider location/radiation/quality/duration/timing/severity/associated sxs/prior Treatment) HPI  Pt seen 06/18/14 in Ed with flank pain, N/V, diagnosed with UTI vs pyelonephritis, d/c home with keflex, norco, zofran.  Urine cx grew staph aureus, asked to return to ED for blood cultures.   Pt reports she has had continued abdominal pain in the right flank.  Pain is sharp, comes and goes quickly, is present throughout the right side and right flank.  Not worse with eating or drinking.  She has dysuria, urinary frequency and urgency.  Associated N/V.  Emesis is streaked with blood.  Having profuse diarrhea as well.  Temperature of 101 at home. She also had chronic lower abdominal pain that is unchanged - this pain is crampy and constant, has been diagnosed with cysts and is to follow up with gyn.  LMP July 9 was on time and normal.  Denies abnormal vaginal discharge or bleeding.   Has been taking norco and ibuprofen without improvement.    Past Medical History  Diagnosis Date  . Hypertension   . Asthma   . Type II diabetes mellitus   . History of blood transfusion     "after I had one of my kids"  . Sickle cell disease   . Anemia   . Depression   . Ovarian cyst   . Renal disorder     kidney stones   Past Surgical History  Procedure Laterality Date  . Cesarean section  2000; 2007; 2011  . Dilation and curettage of uterus    . Appendectomy  2013  . Reduction mammaplasty Bilateral 1998   No family history on file. History  Substance Use Topics  . Smoking status: Former Smoker -- 1 years    Types: Cigarettes  . Smokeless tobacco: Never Used     Comment: 03/27/2014 "smoked ~ 1 cigarette/day; quit in ~ 2013"  . Alcohol Use: No   OB History   Grav Para Term Preterm Abortions  TAB SAB Ect Mult Living   1              Review of Systems  All other systems reviewed and are negative.     Allergies  Amoxicillin  Home Medications   Prior to Admission medications   Medication Sig Start Date End Date Taking? Authorizing Provider  cephALEXin (KEFLEX) 500 MG capsule Take 1 capsule (500 mg total) by mouth 3 (three) times daily. 06/19/14  Yes Hoy Morn, MD  HYDROcodone-acetaminophen (NORCO/VICODIN) 5-325 MG per tablet Take 1 tablet by mouth every 4 (four) hours as needed for moderate pain. 06/19/14  Yes Hoy Morn, MD  insulin aspart (NOVOLOG) 100 UNIT/ML injection Inject 20 Units into the skin 3 (three) times daily.    Yes Historical Provider, MD  insulin glargine (LANTUS) 100 UNIT/ML injection Inject 40 Units into the skin 2 (two) times daily.    Yes Historical Provider, MD  metFORMIN (GLUCOPHAGE) 1000 MG tablet Take 1,000 mg by mouth 2 (two) times daily.   Yes Historical Provider, MD  ondansetron (ZOFRAN ODT) 8 MG disintegrating tablet Take 1 tablet (8 mg total) by mouth every 8 (eight) hours as needed for nausea or vomiting. 06/19/14  Yes Hoy Morn, MD  exenatide (BYETTA) 5 MCG/0.02ML SOPN injection  Inject 5 mcg into the skin 2 (two) times daily with a meal.    Historical Provider, MD   BP 151/85  Pulse 101  Temp(Src) 98.1 F (36.7 C) (Oral)  Resp 16  SpO2 98%  LMP 05/09/2014 Physical Exam  Nursing note and vitals reviewed. Constitutional: She appears well-developed and well-nourished. No distress.  HENT:  Head: Normocephalic and atraumatic.  Neck: Neck supple.  Cardiovascular: Normal rate and regular rhythm.   Pulmonary/Chest: Effort normal and breath sounds normal. No respiratory distress. She has no wheezes. She has no rales.  Abdominal: Soft. Bowel sounds are normal. She exhibits no distension. There is generalized tenderness. There is no rebound and no guarding.  Neurological: She is alert.  Skin: She is not diaphoretic.    ED Course   Procedures (including critical care time) Labs Review Labs Reviewed  CBC WITH DIFFERENTIAL - Abnormal; Notable for the following:    Hemoglobin 11.1 (*)    HCT 34.0 (*)    MCV 71.3 (*)    MCH 23.3 (*)    RDW 16.7 (*)    All other components within normal limits  COMPREHENSIVE METABOLIC PANEL - Abnormal; Notable for the following:    Sodium 134 (*)    Glucose, Bld 311 (*)    Albumin 3.0 (*)    Total Bilirubin <0.2 (*)    All other components within normal limits  URINALYSIS, ROUTINE W REFLEX MICROSCOPIC - Abnormal; Notable for the following:    Specific Gravity, Urine 1.035 (*)    Glucose, UA >1000 (*)    Hgb urine dipstick SMALL (*)    Protein, ur 100 (*)    All other components within normal limits  CULTURE, BLOOD (ROUTINE X 2)  CULTURE, BLOOD (ROUTINE X 2)  LIPASE, BLOOD  URINE MICROSCOPIC-ADD ON    Imaging Review US Abdomen Complete  06/21/2014   CLINICAL DATA:  RUQ pain  EXAM: ULTRASOUND ABDOMEN COMPLETE  COMPARISON:  Prior CT from 06/19/2014  FINDINGS: Gallbladder:  Gallbladder was somewhat contracted without acute abnormality. No definite stones identified. No sonographic Murphy sign. Gallbladder wall measured at the upper limits of normal 3 mm.  Common bile duct:  Diameter: 6 mm  Liver:  No focal lesion identified. Within normal limits in parenchymal echogenicity.  IVC:  No abnormality visualized.  Pancreas:  Visualized portion unremarkable.  Spleen:  Size and appearance within normal limits.  Right Kidney:  Length: 11.9 cm. Echogenicity within normal limits. No mass or hydronephrosis visualized.  Left Kidney:  Length: 12.0 cm. Echogenicity within normal limits. No mass or hydronephrosis visualized.  Abdominal aorta:  No aneurysm visualized.  Other findings:  None.  IMPRESSION: Unremarkable abdominal ultrasound with no acute abnormality identified.   Electronically Signed   By: Jeannine Boga M.D.   On: 06/21/2014 20:20     EKG Interpretation None      MDM    Final diagnoses:  Pyelonephritis  Right flank pain   Afebrile nontoxic patient with acute and chronic abdominal pain, urinary symptoms. Dx with UTI vs pyelonephrisi 06/18/14, placed on keflex and norco.  Pt has been taking keflex and urine culture, despite showing staph aureus, is pan sensitive and should be sensitive to keflex.  UA appears to show improvement.  Labs unremarkable.  D/C home with change in pain medication from norco to percocet.  Pt revises story with me, states she was only taking 2 tabs norco every 4 hours, and only did this 4 times in 24 hours.  Blood cultures  pending.  Discussed result, findings, treatment, and follow up  with patient.  Pt given return precautions.  Pt verbalizes understanding and agrees with plan.       Clayton Bibles, PA-C 06/21/14 2218

## 2014-06-21 NOTE — Telephone Encounter (Signed)
Post ED Visit - Positive Culture Follow-up: Successful Patient Follow-Up  Culture assessed and recommendations reviewed by: []  Wes [place], Pharm.D., BCPS []  Heide Guile, Pharm.D., BCPS []  Alycia Rossetti, Pharm.D., BCPS [x]  [place], [place].D., BCPS, AAHIVP []  Legrand Como, Pharm.D., BCPS, AAHIVP  Positive urine culture  [x]  Patient discharged without antimicrobial prescription and treatment is now indicated []  Organism is resistant to prescribed ED discharge antimicrobial []  Patient with positive blood cultures  Changes discussed with ED provider: Michele Mcalpine PA-C New treatment plan: Have patient return to ED for blood cultures   Matthew Saras, Rex Kras 06/21/2014, 3:27 PM

## 2014-06-21 NOTE — Progress Notes (Signed)
ED Antimicrobial Stewardship Positive Culture Follow Up   Cynthia Rosales is an 33 y.o. female who presented to [facility] on 06/18/2014 with a chief complaint of  Chief Complaint  Patient presents with  . Flank Pain    Recent Results (from the past 720 hour(s))  URINE CULTURE     Status: None   Collection Time    06/19/14  2:08 AM      Result Value Ref Range Status   Specimen Description URINE, CLEAN CATCH   Final   Special Requests NONE   Final   Culture  Setup Time     Final   Value: 06/19/2014 08:37     Performed at [place]     Final   Value: >=100,000 COLONIES/ML     Performed at [facility]   Culture     Final   Value: STAPHYLOCOCCUS AUREUS     Note: RIFAMPIN AND GENTAMICIN SHOULD NOT BE USED AS SINGLE DRUGS FOR TREATMENT OF STAPH INFECTIONS.     Performed at [facility]   Report Status 06/21/2014 FINAL   Final   Organism ID, Bacteria STAPHYLOCOCCUS AUREUS   Final    [x]  Patient discharged originally without antimicrobial agent and treatment is now indicated   33 yo who came in with flank pain. She has a hx kidney stones. Her urine culture came back with staph aureus. When this happens, it can seed it from somewhere else. It's MSSA. We'll try to bring her back to get blood cultures in the ED.   New antibiotic prescription: Cont Keflex  ED Provider: Michele Mcalpine, PA  Onnie Boer, PharmD Pager: [phone] Infectious Diseases Pharmacist Phone# [phone]

## 2014-06-21 NOTE — ED Provider Notes (Signed)
Medical screening examination/treatment/procedure(s) were performed by non-physician practitioner and as supervising physician I was immediately available for consultation/collaboration.  Leota Jacobsen, MD 06/21/14 [id number]

## 2014-06-21 NOTE — ED Notes (Signed)
US at bedside

## 2014-06-23 NOTE — ED Provider Notes (Signed)
Medical screening examination/treatment/procedure(s) were performed by non-physician practitioner and as supervising physician I was immediately available for consultation/collaboration.   EKG Interpretation None        Merryl Hacker, MD 06/23/14 [phone]

## 2014-06-28 LAB — CULTURE, BLOOD (ROUTINE X 2)
Culture: NO GROWTH
Culture: NO GROWTH

## 2014-07-13 ENCOUNTER — Emergency Department (HOSPITAL_COMMUNITY)
Admission: EM | Admit: 2014-07-13 | Discharge: 2014-07-13 | Disposition: A | Discharge status: Home / Self Care | Payer: Medicaid Other | Attending: Emergency Medicine | Admitting: Emergency Medicine

## 2014-07-13 ENCOUNTER — Encounter (HOSPITAL_COMMUNITY): Payer: Self-pay | Admitting: Emergency Medicine

## 2014-07-13 ENCOUNTER — Emergency Department (HOSPITAL_COMMUNITY): Payer: Medicaid Other

## 2014-07-13 DIAGNOSIS — Z79899 Other long term (current) drug therapy: Secondary | ICD-10-CM | POA: Diagnosis not present

## 2014-07-13 DIAGNOSIS — Z87442 Personal history of urinary calculi: Secondary | ICD-10-CM | POA: Insufficient documentation

## 2014-07-13 DIAGNOSIS — Z794 Long term (current) use of insulin: Secondary | ICD-10-CM | POA: Diagnosis not present

## 2014-07-13 DIAGNOSIS — N83209 Unspecified ovarian cyst, unspecified side: Secondary | ICD-10-CM | POA: Diagnosis not present

## 2014-07-13 DIAGNOSIS — Z87891 Personal history of nicotine dependence: Secondary | ICD-10-CM | POA: Insufficient documentation

## 2014-07-13 DIAGNOSIS — B9689 Other specified bacterial agents as the cause of diseases classified elsewhere: Secondary | ICD-10-CM | POA: Insufficient documentation

## 2014-07-13 DIAGNOSIS — Z862 Personal history of diseases of the blood and blood-forming organs and certain disorders involving the immune mechanism: Secondary | ICD-10-CM | POA: Diagnosis not present

## 2014-07-13 DIAGNOSIS — E119 Type 2 diabetes mellitus without complications: Secondary | ICD-10-CM | POA: Diagnosis not present

## 2014-07-13 DIAGNOSIS — A499 Bacterial infection, unspecified: Secondary | ICD-10-CM | POA: Insufficient documentation

## 2014-07-13 DIAGNOSIS — Z3202 Encounter for pregnancy test, result negative: Secondary | ICD-10-CM | POA: Diagnosis not present

## 2014-07-13 DIAGNOSIS — J45909 Unspecified asthma, uncomplicated: Secondary | ICD-10-CM | POA: Insufficient documentation

## 2014-07-13 DIAGNOSIS — Z8659 Personal history of other mental and behavioral disorders: Secondary | ICD-10-CM | POA: Diagnosis not present

## 2014-07-13 DIAGNOSIS — Z880 Allergy status to penicillin: Secondary | ICD-10-CM | POA: Insufficient documentation

## 2014-07-13 DIAGNOSIS — R109 Unspecified abdominal pain: Secondary | ICD-10-CM | POA: Diagnosis present

## 2014-07-13 DIAGNOSIS — N83202 Unspecified ovarian cyst, left side: Secondary | ICD-10-CM

## 2014-07-13 DIAGNOSIS — Z9089 Acquired absence of other organs: Secondary | ICD-10-CM | POA: Insufficient documentation

## 2014-07-13 DIAGNOSIS — I10 Essential (primary) hypertension: Secondary | ICD-10-CM | POA: Diagnosis not present

## 2014-07-13 DIAGNOSIS — N760 Acute vaginitis: Secondary | ICD-10-CM | POA: Diagnosis not present

## 2014-07-13 LAB — WET PREP, GENITAL
Trich, Wet Prep: NONE SEEN
Yeast Wet Prep HPF POC: NONE SEEN

## 2014-07-13 LAB — CBC WITH DIFFERENTIAL/PLATELET
Basophils Absolute: 0 10*3/uL (ref 0.0–0.1)
Basophils Relative: 0 % (ref 0–1)
Eosinophils Absolute: 0.2 10*3/uL (ref 0.0–0.7)
Eosinophils Relative: 2 % (ref 0–5)
HCT: 31.7 % — ABNORMAL LOW (ref 36.0–46.0)
Hemoglobin: 10.2 g/dL — ABNORMAL LOW (ref 12.0–15.0)
Lymphocytes Relative: 39 % (ref 12–46)
Lymphs Abs: 4 10*3/uL (ref 0.7–4.0)
MCH: 22.9 pg — ABNORMAL LOW (ref 26.0–34.0)
MCHC: 32.2 g/dL (ref 30.0–36.0)
MCV: 71.1 fL — ABNORMAL LOW (ref 78.0–100.0)
Monocytes Absolute: 0.4 10*3/uL (ref 0.1–1.0)
Monocytes Relative: 4 % (ref 3–12)
Neutro Abs: 5.6 10*3/uL (ref 1.7–7.7)
Neutrophils Relative %: 55 % (ref 43–77)
Platelets: 380 10*3/uL (ref 150–400)
RBC: 4.46 MIL/uL (ref 3.87–5.11)
RDW: 16.7 % — ABNORMAL HIGH (ref 11.5–15.5)
WBC: 10.2 10*3/uL (ref 4.0–10.5)

## 2014-07-13 LAB — URINALYSIS, ROUTINE W REFLEX MICROSCOPIC
Bilirubin Urine: NEGATIVE
Glucose, UA: >1000 mg/dL — AB
Ketones, ur: NEGATIVE mg/dL
Leukocytes, UA: NEGATIVE
Nitrite: NEGATIVE
Protein, ur: 100 mg/dL — AB
Specific Gravity, Urine: 1.03 (ref 1.005–1.030)
Urobilinogen, UA: 0.2 mg/dL (ref 0.0–1.0)
pH: 5.5 (ref 5.0–8.0)

## 2014-07-13 LAB — BASIC METABOLIC PANEL
Anion gap: 12 (ref 5–15)
BUN: 12 mg/dL (ref 6–23)
CO2: 25 mEq/L (ref 19–32)
Calcium: 8.2 mg/dL — ABNORMAL LOW (ref 8.4–10.5)
Chloride: 99 mEq/L (ref 96–112)
Creatinine, Ser: 0.72 mg/dL (ref 0.50–1.10)
GFR calc Af Amer: >90 mL/min (ref >90)
GFR calc non Af Amer: >90 mL/min (ref >90)
Glucose, Bld: 379 mg/dL — ABNORMAL HIGH (ref 70–99)
Potassium: 4.4 mEq/L (ref 3.7–5.3)
Sodium: 136 mEq/L — ABNORMAL LOW (ref 137–147)

## 2014-07-13 LAB — URINE MICROSCOPIC-ADD ON

## 2014-07-13 LAB — HIV ANTIBODY (ROUTINE TESTING W REFLEX): HIV 1&2 Ab, 4th Generation: NONREACTIVE

## 2014-07-13 LAB — POC URINE PREG, ED: Preg Test, Ur: NEGATIVE

## 2014-07-13 MED ORDER — ONDANSETRON 4 MG PO TBDP
8.0000 mg | ORAL_TABLET | Freq: Once | ORAL | Status: AC
  Administered 2014-07-13: 8 mg via ORAL
  Filled 2014-07-13: qty 2

## 2014-07-13 MED ORDER — HYDROMORPHONE HCL PF 1 MG/ML IJ SOLN
1.0000 mg | Freq: Once | INTRAMUSCULAR | Status: AC
  Administered 2014-07-13: 1 mg via INTRAVENOUS
  Filled 2014-07-13: qty 1

## 2014-07-13 MED ORDER — OXYCODONE-ACETAMINOPHEN 5-325 MG PO TABS: 1.0000 | ORAL_TABLET | Freq: Four times a day (QID) | ORAL | Status: DC | PRN

## 2014-07-13 MED ORDER — MELOXICAM 15 MG PO TABS: 15.0000 mg | ORAL_TABLET | Freq: Every day | ORAL | Status: DC

## 2014-07-13 MED ORDER — METRONIDAZOLE 500 MG PO TABS: 500.0000 mg | ORAL_TABLET | Freq: Two times a day (BID) | ORAL | Status: DC

## 2014-07-13 MED ORDER — SODIUM CHLORIDE 0.9 % IV BOLUS (SEPSIS)
1000.0000 mL | Freq: Once | INTRAVENOUS | Status: AC
  Administered 2014-07-13: 1000 mL via INTRAVENOUS

## 2014-07-13 MED ORDER — OXYCODONE-ACETAMINOPHEN 5-325 MG PO TABS
1.0000 | ORAL_TABLET | Freq: Once | ORAL | Status: AC
  Administered 2014-07-13: 1 via ORAL
  Filled 2014-07-13: qty 1

## 2014-07-13 MED ORDER — KETOROLAC TROMETHAMINE 30 MG/ML IJ SOLN
30.0000 mg | Freq: Once | INTRAMUSCULAR | Status: AC
  Administered 2014-07-13: 30 mg via INTRAVENOUS
  Filled 2014-07-13: qty 1

## 2014-07-13 NOTE — Discharge Instructions (Signed)
You were found to have a left ovarian cyst today that your provider(s) feel is likely the cause to your pains. Please follow up with a primary care provider and OB/GYN for further evaluation. You were also found to have a bacterial vaginosis infection. Please use the antibiotic as prescribed.    Bacterial Vaginosis Bacterial vaginosis is a vaginal infection that occurs when the normal balance of bacteria in the vagina is disrupted. It results from an overgrowth of certain bacteria. This is the most common vaginal infection in women of childbearing age. Treatment is important to prevent complications, especially in pregnant women, as it can cause a premature delivery. CAUSES  Bacterial vaginosis is caused by an increase in harmful bacteria that are normally present in smaller amounts in the vagina. Several different kinds of bacteria can cause bacterial vaginosis. However, the reason that the condition develops is not fully understood. RISK FACTORS Certain activities or behaviors can put you at an increased risk of developing bacterial vaginosis, including:  Having a new sex partner or multiple sex partners.  Douching.  Using an intrauterine device (IUD) for contraception. Women do not get bacterial vaginosis from toilet seats, bedding, swimming pools, or contact with objects around them. SIGNS AND SYMPTOMS  Some women with bacterial vaginosis have no signs or symptoms. Common symptoms include:  Grey vaginal discharge.  A fishlike odor with discharge, especially after sexual intercourse.  Itching or burning of the vagina and vulva.  Burning or pain with urination. DIAGNOSIS  Your health care provider will take a medical history and examine the vagina for signs of bacterial vaginosis. A sample of vaginal fluid may be taken. Your health care provider will look at this sample under a microscope to check for bacteria and abnormal cells. A vaginal pH test may also be done.  TREATMENT    Bacterial vaginosis may be treated with antibiotic medicines. These may be given in the form of a pill or a vaginal cream. A second round of antibiotics may be prescribed if the condition comes back after treatment.  HOME CARE INSTRUCTIONS   Only take over-the-counter or prescription medicines as directed by your health care provider.  If antibiotic medicine was prescribed, take it as directed. Make sure you finish it even if you start to feel better.  Do not have sex until treatment is completed.  Tell all sexual partners that you have a vaginal infection. They should see their health care provider and be treated if they have problems, such as a mild rash or itching.  Practice safe sex by using condoms and only having one sex partner. SEEK MEDICAL CARE IF:   Your symptoms are not improving after 3 days of treatment.  You have increased discharge or pain.  You have a fever. MAKE SURE YOU:   Understand these instructions.  Will watch your condition.  Will get help right away if you are not doing well or get worse. FOR MORE INFORMATION  Centers for Disease Control and Prevention, Division of STD Prevention: [employer] American Sexual Health Association (ASHA): www.ashastd.org  Document Released: 11/16/2005 Document Revised: 09/06/2013 Document Reviewed: 06/28/2013 [facility] Patient Information 2015 [place], [place]. This information is not intended to replace advice given to you by your health care provider. Make sure you discuss any questions you have with your health care provider.    Ovarian Cyst An ovarian cyst is a fluid-filled sac that forms on an ovary. The ovaries are small organs that produce eggs in women. Various types of cysts  can form on the ovaries. Most are not cancerous. Many do not cause problems, and they often go away on their own. Some may cause symptoms and require treatment. Common types of ovarian cysts include:  Functional cysts--These cysts may occur  every month during the menstrual cycle. This is normal. The cysts usually go away with the next menstrual cycle if the woman does not get pregnant. Usually, there are no symptoms with a functional cyst.  Endometrioma cysts--These cysts form from the tissue that lines the uterus. They are also called "chocolate cysts" because they become filled with blood that turns brown. This type of cyst can cause pain in the lower abdomen during intercourse and with your menstrual period.  Cystadenoma cysts--This type develops from the cells on the outside of the ovary. These cysts can get very big and cause lower abdomen pain and pain with intercourse. This type of cyst can twist on itself, cut off its blood supply, and cause severe pain. It can also easily rupture and cause a lot of pain.  Dermoid cysts--This type of cyst is sometimes found in both ovaries. These cysts may contain different kinds of body tissue, such as skin, teeth, hair, or cartilage. They usually do not cause symptoms unless they get very big.  Theca lutein cysts--These cysts occur when too much of a certain hormone (human chorionic gonadotropin) is produced and overstimulates the ovaries to produce an egg. This is most common after procedures used to assist with the conception of a baby (in vitro fertilization). CAUSES   Fertility drugs can cause a condition in which multiple large cysts are formed on the ovaries. This is called ovarian hyperstimulation syndrome.  A condition called polycystic ovary syndrome can cause hormonal imbalances that can lead to nonfunctional ovarian cysts. SIGNS AND SYMPTOMS  Many ovarian cysts do not cause symptoms. If symptoms are present, they may include:  Pelvic pain or pressure.  Pain in the lower abdomen.  Pain during sexual intercourse.  Increasing girth (swelling) of the abdomen.  Abnormal menstrual periods.  Increasing pain with menstrual periods.  Stopping having menstrual periods without  being pregnant. DIAGNOSIS  These cysts are commonly found during a routine or annual pelvic exam. Tests may be ordered to find out more about the cyst. These tests may include:  Ultrasound.  X-ray of the pelvis.  CT scan.  MRI.  Blood tests. TREATMENT  Many ovarian cysts go away on their own without treatment. Your health care provider may want to check your cyst regularly for 2-3 months to see if it changes. For women in menopause, it is particularly important to monitor a cyst closely because of the higher rate of ovarian cancer in menopausal women. When treatment is needed, it may include any of the following:  A procedure to drain the cyst (aspiration). This may be done using a long needle and ultrasound. It can also be done through a laparoscopic procedure. This involves using a thin, lighted tube with a tiny camera on the end (laparoscope) inserted through a small incision.  Surgery to remove the whole cyst. This may be done using laparoscopic surgery or an open surgery involving a larger incision in the lower abdomen.  Hormone treatment or birth control pills. These methods are sometimes used to help dissolve a cyst. HOME CARE INSTRUCTIONS   Only take over-the-counter or prescription medicines as directed by your health care provider.  Follow up with your health care provider as directed.  Get regular pelvic exams and Pap  tests. SEEK MEDICAL CARE IF:   Your periods are late, irregular, or painful, or they stop.  Your pelvic pain or abdominal pain does not go away.  Your abdomen becomes larger or swollen.  You have pressure on your bladder or trouble emptying your bladder completely.  You have pain during sexual intercourse.  You have feelings of fullness, pressure, or discomfort in your stomach.  You lose weight for no apparent reason.  You feel generally ill.  You become constipated.  You lose your appetite.  You develop acne.  You have an increase in body  and facial hair.  You are gaining weight, without changing your exercise and eating habits.  You think you are pregnant. SEEK IMMEDIATE MEDICAL CARE IF:   You have increasing abdominal pain.  You feel sick to your stomach (nauseous), and you throw up (vomit).  You develop a fever that comes on suddenly.  You have abdominal pain during a bowel movement.  Your menstrual periods become heavier than usual. MAKE SURE YOU:  Understand these instructions.  Will watch your condition.  Will get help right away if you are not doing well or get worse. Document Released: 11/16/2005 Document Revised: 11/21/2013 Document Reviewed: 07/24/2013 [facility] Patient Information 2015 [place], [place]. This information is not intended to replace advice given to you by your health care provider. Make sure you discuss any questions you have with your health care provider.

## 2014-07-13 NOTE — ED Notes (Signed)
PA at bedside.

## 2014-07-13 NOTE — ED Notes (Signed)
Pelvic cart set up at bedside  

## 2014-07-13 NOTE — ED Notes (Signed)
Patient transported to Ultrasound 

## 2014-07-13 NOTE — ED Notes (Signed)
Presents with vaginal pain and lower abdominal pain described as "bad menstrual cramp" began 2 days ago. LMP due on the 07/05/14 has nolt come-denies vaginal discharge.

## 2014-07-13 NOTE — ED Provider Notes (Signed)
CSN: [id number]     Arrival date & time 07/13/14  0110 History   First MD Initiated Contact with Patient 07/13/14 0308     Chief Complaint  Patient presents with  . Abdominal Pain   HPI  History provided by the patient. Patient is a 33 year old female with history of hypertension, diabetes who presents with complaints of abdominal pains. Patient reports having sharp abdominal pains radiating around her left flank into her abdomen and down to the vagina area. She is having some similar crit on the right side. They feel similar to previous menstrual cramps but much more intense. Patient does state she is concern for possible pregnancy and that her last menstrual cycle was on July 7 and she has not menstruated yet in August. She is actually active. She denies any vaginal bleeding or vaginal discharge. She denies any fever, chills, sweats, nausea or vomiting.    Past Medical History  Diagnosis Date  . Hypertension   . Asthma   . Type II diabetes mellitus   . History of blood transfusion     "after I had one of my kids"  . Sickle cell disease   . Anemia   . Depression   . Ovarian cyst   . Renal disorder     kidney stones   Past Surgical History  Procedure Laterality Date  . Cesarean section  2000; 2007; 2011  . Dilation and curettage of uterus    . Appendectomy  2013  . Reduction mammaplasty Bilateral 1998   History reviewed. No pertinent family history. History  Substance Use Topics  . Smoking status: Former Smoker -- 1 years    Types: Cigarettes  . Smokeless tobacco: Never Used     Comment: 03/27/2014 "smoked ~ 1 cigarette/day; quit in ~ 2013"  . Alcohol Use: No   OB History   Grav Para Term Preterm Abortions TAB SAB Ect Mult Living   1              Review of Systems  Constitutional: Negative for fever, chills and diaphoresis.  Gastrointestinal: Positive for abdominal pain. Negative for nausea, vomiting, diarrhea and constipation.  Genitourinary: Positive for flank pain.  Negative for dysuria, frequency, hematuria, vaginal bleeding and vaginal discharge.  All other systems reviewed and are negative.     Allergies  Amoxicillin  Home Medications   Prior to Admission medications   Medication Sig Start Date End Date Taking? Authorizing Provider  exenatide (BYETTA) 5 MCG/0.02ML SOPN injection Inject 5 mcg into the skin 2 (two) times daily with a meal.   Yes Historical Provider, MD  HYDROcodone-acetaminophen (NORCO/VICODIN) 5-325 MG per tablet Take 1 tablet by mouth every 4 (four) hours as needed for moderate pain.   Yes Historical Provider, MD  insulin aspart (NOVOLOG) 100 UNIT/ML injection Inject 20 Units into the skin 3 (three) times daily.    Yes Historical Provider, MD  insulin glargine (LANTUS) 100 UNIT/ML injection Inject 40 Units into the skin 2 (two) times daily.    Yes Historical Provider, MD  metFORMIN (GLUCOPHAGE) 1000 MG tablet Take 1,000 mg by mouth 2 (two) times daily.   Yes Historical Provider, MD  ondansetron (ZOFRAN ODT) 8 MG disintegrating tablet Take 1 tablet (8 mg total) by mouth every 8 (eight) hours as needed for nausea or vomiting. 06/19/14   Hoy Morn, MD   BP 156/96  Pulse 100  Temp(Src) 98.3 F (36.8 C) (Oral)  Resp 20  SpO2 98%  LMP 05/09/2014 Physical Exam  Nursing note and vitals reviewed. Constitutional: She is oriented to person, place, and time. She appears well-developed and well-nourished. No distress.  HENT:  Head: Normocephalic.  Cardiovascular: Normal rate and regular rhythm.   Pulmonary/Chest: Effort normal and breath sounds normal. No respiratory distress.  Abdominal: Soft. There is tenderness. There is no rebound and no guarding.  Mildly obese. Exam is limited by body habitus. There is some lower abdominal tenderness.  Genitourinary:  Chaperone was present. There is moderate to large thick white discharge throughout. Patient has left adnexal tenderness without appreciable mass. Body habitus does limit  examination. No CMT.  Neurological: She is alert and oriented to person, place, and time.  Skin: Skin is warm and dry. No rash noted.  Psychiatric: She has a normal mood and affect. Her behavior is normal.    ED Course  Procedures   COORDINATION OF CARE:  Nursing notes reviewed. Vital signs reviewed. Initial pt interview and examination performed.   Filed Vitals:   07/13/14 0143  BP: 156/96  Pulse: 100  Temp: 98.3 F (36.8 C)  TempSrc: Oral  Resp: 20  SpO2: 98%    3:37 AM-patient seen and evaluated. She appears well does not appear in acute distress. Afebrile. Nontoxic appearing.  Pt feeling some improvements after Dilaudid. Still rates pains at a 6/10. Will give second dose.   Labs show signs of BV. [place] with left ovarian cyst. No other concerning findings. At this time pt may be d/c home to follow up with PCP and OB/GYN     Treatment plan initiated: Medications  ondansetron (ZOFRAN-ODT) disintegrating tablet 8 mg (8 mg Oral Given 07/13/14 0149)  oxyCODONE-acetaminophen (PERCOCET/ROXICET) 5-325 MG per tablet 1 tablet (1 tablet Oral Given 07/13/14 0149)  sodium chloride 0.9 % bolus 1,000 mL (1,000 mLs Intravenous New Bag/Given 07/13/14 0426)  ketorolac (TORADOL) 30 MG/ML injection 30 mg (30 mg Intravenous Given 07/13/14 0426)    Results for orders placed during the hospital encounter of 07/13/14  WET PREP, GENITAL      Result Value Ref Range   Yeast Wet Prep HPF POC NONE SEEN  NONE SEEN   Trich, Wet Prep NONE SEEN  NONE SEEN   Clue Cells Wet Prep HPF POC TOO NUMEROUS TO COUNT (*) NONE SEEN   WBC, Wet Prep HPF POC FEW (*) NONE SEEN  URINALYSIS, ROUTINE W REFLEX MICROSCOPIC      Result Value Ref Range   Color, Urine YELLOW  YELLOW   APPearance CLOUDY (*) CLEAR   Specific Gravity, Urine 1.030  1.005 - 1.030   pH 5.5  5.0 - 8.0   Glucose, UA >1000 (*) NEGATIVE mg/dL   Hgb urine dipstick TRACE (*) NEGATIVE   Bilirubin Urine NEGATIVE  NEGATIVE   Ketones, ur NEGATIVE   NEGATIVE mg/dL   Protein, ur 100 (*) NEGATIVE mg/dL   Urobilinogen, UA 0.2  0.0 - 1.0 mg/dL   Nitrite NEGATIVE  NEGATIVE   Leukocytes, UA NEGATIVE  NEGATIVE  CBC WITH DIFFERENTIAL      Result Value Ref Range   WBC 10.2  4.0 - 10.5 K/uL   RBC 4.46  3.87 - 5.11 MIL/uL   Hemoglobin 10.2 (*) 12.0 - 15.0 g/dL   HCT 31.7 (*) 36.0 - 46.0 %   MCV 71.1 (*) 78.0 - 100.0 fL   MCH 22.9 (*) 26.0 - 34.0 pg   MCHC 32.2  30.0 - 36.0 g/dL   RDW 16.7 (*) 11.5 - 15.5 %   Platelets 380  150 -  400 K/uL   Neutrophils Relative % 55  43 - 77 %   Neutro Abs 5.6  1.7 - 7.7 K/uL   Lymphocytes Relative 39  12 - 46 %   Lymphs Abs 4.0  0.7 - 4.0 K/uL   Monocytes Relative 4  3 - 12 %   Monocytes Absolute 0.4  0.1 - 1.0 K/uL   Eosinophils Relative 2  0 - 5 %   Eosinophils Absolute 0.2  0.0 - 0.7 K/uL   Basophils Relative 0  0 - 1 %   Basophils Absolute 0.0  0.0 - 0.1 K/uL  BASIC METABOLIC PANEL      Result Value Ref Range   Sodium 136 (*) 137 - 147 mEq/L   Potassium 4.4  3.7 - 5.3 mEq/L   Chloride 99  96 - 112 mEq/L   CO2 25  19 - 32 mEq/L   Glucose, Bld 379 (*) 70 - 99 mg/dL   BUN 12  6 - 23 mg/dL   Creatinine, Ser 0.72  0.50 - 1.10 mg/dL   Calcium 8.2 (*) 8.4 - 10.5 mg/dL   GFR calc non Af Amer >90  >90 mL/min   GFR calc Af Amer >90  >90 mL/min   Anion gap 12  5 - 15  HIV ANTIBODY (ROUTINE TESTING)      Result Value Ref Range   HIV 1&2 Ab, 4th Generation NONREACTIVE  NONREACTIVE  URINE MICROSCOPIC-ADD ON      Result Value Ref Range   Squamous Epithelial / LPF RARE  RARE   WBC, UA 0-2  <3 WBC/hpf   RBC / HPF 3-6  <3 RBC/hpf   Bacteria, UA RARE  RARE  POC URINE PREG, ED      Result Value Ref Range   Preg Test, Ur NEGATIVE  NEGATIVE      Imaging Review US Transvaginal Non-ob  07/13/2014   CLINICAL DATA:  Left-sided pelvic pain.  EXAM: TRANSABDOMINAL AND TRANSVAGINAL ULTRASOUND OF PELVIS  DOPPLER ULTRASOUND OF OVARIES  TECHNIQUE: Both transabdominal and transvaginal ultrasound examinations of  the pelvis were performed. Transabdominal technique was performed for global imaging of the pelvis including uterus, ovaries, adnexal regions, and pelvic cul-de-sac.  It was necessary to proceed with endovaginal exam following the transabdominal exam to visualize the uterus and ovaries in greater detail. Color and duplex Doppler ultrasound was utilized to evaluate blood flow to the ovaries.  COMPARISON:  Pelvic ultrasound performed 04/02/2014, and CT of the abdomen and pelvis performed 06/19/2014  FINDINGS: Uterus  Measurements: 9.4 x 4.4 x 6.0 cm. No fibroids or other mass visualized. Nabothian cysts are seen at the cervix.  Endometrium  Thickness: 0.1 cm.  No focal abnormality visualized.  Right ovary  Measurements: 2.2 x 1.3 x 1.7 cm. Normal appearance/no adnexal mass.  Left ovary  Measurements: 4.5 x 3.1 x 4.2 cm. A mildly complex cyst is noted at the left ovary, measuring 3.9 x 2.5 x 3.6 cm. This might reflect an organized hemorrhagic cyst, given the peripheral lace-like pattern.  Pulsed Doppler evaluation of both ovaries demonstrates normal low-resistance arterial and venous waveforms.  Other findings  A small amount of free fluid is noted at the left adnexa.  IMPRESSION: 1. Mildly complex cyst at the left adnexa, measuring 3.9 x 2.5 x 3.6 cm. This might reflect an organized hemorrhagic cyst, given the peripheral lace-like pattern. No definite soft tissue nodules seen with regard to the cyst. Small amount of associated free fluid noted at the left adnexa.  2. Otherwise unremarkable pelvic ultrasound. No evidence for ovarian torsion.   Electronically Signed   By: Garald Balding M.D.   On: 07/13/2014 05:51   US Pelvis Complete  07/13/2014   CLINICAL DATA:  Left-sided pelvic pain.  EXAM: TRANSABDOMINAL AND TRANSVAGINAL ULTRASOUND OF PELVIS  DOPPLER ULTRASOUND OF OVARIES  TECHNIQUE: Both transabdominal and transvaginal ultrasound examinations of the pelvis were performed. Transabdominal technique was performed  for global imaging of the pelvis including uterus, ovaries, adnexal regions, and pelvic cul-de-sac.  It was necessary to proceed with endovaginal exam following the transabdominal exam to visualize the uterus and ovaries in greater detail. Color and duplex Doppler ultrasound was utilized to evaluate blood flow to the ovaries.  COMPARISON:  Pelvic ultrasound performed 04/02/2014, and CT of the abdomen and pelvis performed 06/19/2014  FINDINGS: Uterus  Measurements: 9.4 x 4.4 x 6.0 cm. No fibroids or other mass visualized. Nabothian cysts are seen at the cervix.  Endometrium  Thickness: 0.1 cm.  No focal abnormality visualized.  Right ovary  Measurements: 2.2 x 1.3 x 1.7 cm. Normal appearance/no adnexal mass.  Left ovary  Measurements: 4.5 x 3.1 x 4.2 cm. A mildly complex cyst is noted at the left ovary, measuring 3.9 x 2.5 x 3.6 cm. This might reflect an organized hemorrhagic cyst, given the peripheral lace-like pattern.  Pulsed Doppler evaluation of both ovaries demonstrates normal low-resistance arterial and venous waveforms.  Other findings  A small amount of free fluid is noted at the left adnexa.  IMPRESSION: 1. Mildly complex cyst at the left adnexa, measuring 3.9 x 2.5 x 3.6 cm. This might reflect an organized hemorrhagic cyst, given the peripheral lace-like pattern. No definite soft tissue nodules seen with regard to the cyst. Small amount of associated free fluid noted at the left adnexa. 2. Otherwise unremarkable pelvic ultrasound. No evidence for ovarian torsion.   Electronically Signed   By: Garald Balding M.D.   On: 07/13/2014 05:51   [place] Art/ven Flow Abd Pelv Doppler  07/13/2014   CLINICAL DATA:  Left-sided pelvic pain.  EXAM: TRANSABDOMINAL AND TRANSVAGINAL ULTRASOUND OF PELVIS  DOPPLER ULTRASOUND OF OVARIES  TECHNIQUE: Both transabdominal and transvaginal ultrasound examinations of the pelvis were performed. Transabdominal technique was performed for global imaging of the pelvis including uterus,  ovaries, adnexal regions, and pelvic cul-de-sac.  It was necessary to proceed with endovaginal exam following the transabdominal exam to visualize the uterus and ovaries in greater detail. Color and duplex Doppler ultrasound was utilized to evaluate blood flow to the ovaries.  COMPARISON:  Pelvic ultrasound performed 04/02/2014, and CT of the abdomen and pelvis performed 06/19/2014  FINDINGS: Uterus  Measurements: 9.4 x 4.4 x 6.0 cm. No fibroids or other mass visualized. Nabothian cysts are seen at the cervix.  Endometrium  Thickness: 0.1 cm.  No focal abnormality visualized.  Right ovary  Measurements: 2.2 x 1.3 x 1.7 cm. Normal appearance/no adnexal mass.  Left ovary  Measurements: 4.5 x 3.1 x 4.2 cm. A mildly complex cyst is noted at the left ovary, measuring 3.9 x 2.5 x 3.6 cm. This might reflect an organized hemorrhagic cyst, given the peripheral lace-like pattern.  Pulsed Doppler evaluation of both ovaries demonstrates normal low-resistance arterial and venous waveforms.  Other findings  A small amount of free fluid is noted at the left adnexa.  IMPRESSION: 1. Mildly complex cyst at the left adnexa, measuring 3.9 x 2.5 x 3.6 cm. This might reflect an organized hemorrhagic cyst, given the peripheral lace-like pattern. No  definite soft tissue nodules seen with regard to the cyst. Small amount of associated free fluid noted at the left adnexa. 2. Otherwise unremarkable pelvic ultrasound. No evidence for ovarian torsion.   Electronically Signed   By: Garald Balding M.D.   On: 07/13/2014 05:51     MDM   Final diagnoses:  Cyst of left ovary  Bacterial vaginosis    Martie Lee, PA-C 07/13/14 2016

## 2014-07-13 NOTE — ED Notes (Signed)
Pt states last Menstrual on 06/05/2014 possibly pregnant

## 2014-07-14 LAB — GC/CHLAMYDIA PROBE AMP
CT Probe RNA: NEGATIVE
GC Probe RNA: NEGATIVE

## 2014-07-14 NOTE — ED Provider Notes (Signed)
Medical screening examination/treatment/procedure(s) were performed by non-physician practitioner and as supervising physician I was immediately available for consultation/collaboration.   EKG Interpretation None       Kalman Drape, MD 07/14/14 [phone]

## 2014-09-10 ENCOUNTER — Emergency Department (HOSPITAL_COMMUNITY)
Admission: EM | Admit: 2014-09-10 | Discharge: 2014-09-11 | Disposition: A | Discharge status: Home / Self Care | Payer: Medicaid Other | Attending: Emergency Medicine | Admitting: Emergency Medicine

## 2014-09-10 ENCOUNTER — Encounter (HOSPITAL_COMMUNITY): Payer: Self-pay | Admitting: Emergency Medicine

## 2014-09-10 DIAGNOSIS — Z794 Long term (current) use of insulin: Secondary | ICD-10-CM | POA: Insufficient documentation

## 2014-09-10 DIAGNOSIS — R443 Hallucinations, unspecified: Secondary | ICD-10-CM

## 2014-09-10 DIAGNOSIS — Z8659 Personal history of other mental and behavioral disorders: Secondary | ICD-10-CM | POA: Diagnosis not present

## 2014-09-10 DIAGNOSIS — Z87891 Personal history of nicotine dependence: Secondary | ICD-10-CM | POA: Insufficient documentation

## 2014-09-10 DIAGNOSIS — J45909 Unspecified asthma, uncomplicated: Secondary | ICD-10-CM | POA: Insufficient documentation

## 2014-09-10 DIAGNOSIS — Z87442 Personal history of urinary calculi: Secondary | ICD-10-CM | POA: Diagnosis not present

## 2014-09-10 DIAGNOSIS — F515 Nightmare disorder: Secondary | ICD-10-CM | POA: Diagnosis present

## 2014-09-10 DIAGNOSIS — I10 Essential (primary) hypertension: Secondary | ICD-10-CM | POA: Diagnosis not present

## 2014-09-10 DIAGNOSIS — Z862 Personal history of diseases of the blood and blood-forming organs and certain disorders involving the immune mechanism: Secondary | ICD-10-CM | POA: Insufficient documentation

## 2014-09-10 DIAGNOSIS — E669 Obesity, unspecified: Secondary | ICD-10-CM | POA: Diagnosis not present

## 2014-09-10 DIAGNOSIS — Z792 Long term (current) use of antibiotics: Secondary | ICD-10-CM | POA: Diagnosis not present

## 2014-09-10 DIAGNOSIS — Z791 Long term (current) use of non-steroidal anti-inflammatories (NSAID): Secondary | ICD-10-CM | POA: Insufficient documentation

## 2014-09-10 DIAGNOSIS — Z79899 Other long term (current) drug therapy: Secondary | ICD-10-CM | POA: Diagnosis not present

## 2014-09-10 DIAGNOSIS — E119 Type 2 diabetes mellitus without complications: Secondary | ICD-10-CM | POA: Insufficient documentation

## 2014-09-10 DIAGNOSIS — R45850 Homicidal ideations: Secondary | ICD-10-CM | POA: Diagnosis not present

## 2014-09-10 DIAGNOSIS — Z880 Allergy status to penicillin: Secondary | ICD-10-CM | POA: Insufficient documentation

## 2014-09-10 LAB — COMPREHENSIVE METABOLIC PANEL
ALT: 15 U/L (ref 0–35)
AST: 10 U/L (ref 0–37)
Albumin: 2.7 g/dL — ABNORMAL LOW (ref 3.5–5.2)
Alkaline Phosphatase: 87 U/L (ref 39–117)
Anion gap: 13 (ref 5–15)
BUN: 14 mg/dL (ref 6–23)
CO2: 22 mEq/L (ref 19–32)
Calcium: 9.2 mg/dL (ref 8.4–10.5)
Chloride: 102 mEq/L (ref 96–112)
Creatinine, Ser: 0.71 mg/dL (ref 0.50–1.10)
GFR calc Af Amer: >90 mL/min (ref >90)
GFR calc non Af Amer: >90 mL/min (ref >90)
Glucose, Bld: 261 mg/dL — ABNORMAL HIGH (ref 70–99)
Potassium: 4.4 mEq/L (ref 3.7–5.3)
Sodium: 137 mEq/L (ref 137–147)
Total Bilirubin: <0.2 mg/dL — ABNORMAL LOW (ref 0.3–1.2)
Total Protein: 7.7 g/dL (ref 6.0–8.3)

## 2014-09-10 LAB — RAPID URINE DRUG SCREEN, HOSP PERFORMED
Amphetamines: NOT DETECTED
Barbiturates: NOT DETECTED
Benzodiazepines: NOT DETECTED
Cocaine: NOT DETECTED
Opiates: NOT DETECTED
Tetrahydrocannabinol: POSITIVE — AB

## 2014-09-10 LAB — ETHANOL: Alcohol, Ethyl (B): <11 mg/dL (ref 0–11)

## 2014-09-10 LAB — ACETAMINOPHEN LEVEL: Acetaminophen (Tylenol), Serum: <15 ug/mL (ref 10–30)

## 2014-09-10 LAB — CBC
HCT: 34.4 % — ABNORMAL LOW (ref 36.0–46.0)
Hemoglobin: 11.2 g/dL — ABNORMAL LOW (ref 12.0–15.0)
MCH: 23.1 pg — ABNORMAL LOW (ref 26.0–34.0)
MCHC: 32.6 g/dL (ref 30.0–36.0)
MCV: 70.9 fL — ABNORMAL LOW (ref 78.0–100.0)
Platelets: 334 10*3/uL (ref 150–400)
RBC: 4.85 MIL/uL (ref 3.87–5.11)
RDW: 17.1 % — ABNORMAL HIGH (ref 11.5–15.5)
WBC: 9.5 10*3/uL (ref 4.0–10.5)

## 2014-09-10 LAB — SALICYLATE LEVEL: Salicylate Lvl: <2 mg/dL — ABNORMAL LOW (ref 2.8–20.0)

## 2014-09-10 LAB — CBG MONITORING, ED
Glucose-Capillary: 202 mg/dL — ABNORMAL HIGH (ref 70–99)
Glucose-Capillary: 122 mg/dL — ABNORMAL HIGH (ref 70–99)

## 2014-09-10 MED ORDER — ONDANSETRON HCL 4 MG PO TABS: 4.0000 mg | ORAL_TABLET | Freq: Three times a day (TID) | ORAL | Status: DC | PRN

## 2014-09-10 MED ORDER — LORAZEPAM 1 MG PO TABS: 1.0000 mg | ORAL_TABLET | Freq: Three times a day (TID) | ORAL | Status: DC | PRN

## 2014-09-10 MED ORDER — METOPROLOL TARTRATE 25 MG PO TABS
25.0000 mg | ORAL_TABLET | Freq: Once | ORAL | Status: AC
  Administered 2014-09-10: 25 mg via ORAL
  Filled 2014-09-10: qty 1

## 2014-09-10 MED ORDER — EXENATIDE 5 MCG/0.02ML ~~LOC~~ SOPN
5.0000 ug | PEN_INJECTOR | Freq: Two times a day (BID) | SUBCUTANEOUS | Status: DC
  Filled 2014-09-10: qty 1.2

## 2014-09-10 MED ORDER — IBUPROFEN 200 MG PO TABS: 600.0000 mg | ORAL_TABLET | Freq: Three times a day (TID) | ORAL | Status: DC | PRN

## 2014-09-10 MED ORDER — INSULIN GLARGINE 100 UNIT/ML ~~LOC~~ SOLN
50.0000 [IU] | Freq: Two times a day (BID) | SUBCUTANEOUS | Status: DC
  Administered 2014-09-10 – 2014-09-11 (×2): 50 [IU] via SUBCUTANEOUS
  Filled 2014-09-10 (×4): qty 0.5

## 2014-09-10 MED ORDER — ACETAMINOPHEN 325 MG PO TABS
650.0000 mg | ORAL_TABLET | ORAL | Status: DC | PRN
Start: 2014-09-10 — End: 2014-09-11

## 2014-09-10 MED ORDER — METFORMIN HCL 500 MG PO TABS
1000.0000 mg | ORAL_TABLET | Freq: Two times a day (BID) | ORAL | Status: DC
  Administered 2014-09-10 – 2014-09-11 (×2): 1000 mg via ORAL
  Filled 2014-09-10 (×5): qty 2

## 2014-09-10 MED ORDER — METFORMIN HCL 500 MG PO TABS
1000.0000 mg | ORAL_TABLET | Freq: Two times a day (BID) | ORAL | Status: DC
  Filled 2014-09-10 (×2): qty 2

## 2014-09-10 MED ORDER — INSULIN ASPART 100 UNIT/ML ~~LOC~~ SOLN
20.0000 [IU] | Freq: Three times a day (TID) | SUBCUTANEOUS | Status: DC
Start: 2014-09-10 — End: 2014-09-11
  Administered 2014-09-10 – 2014-09-11 (×2): 20 [IU] via SUBCUTANEOUS
  Filled 2014-09-10: qty 1

## 2014-09-10 MED ORDER — ALUM & MAG HYDROXIDE-SIMETH 200-200-20 MG/5ML PO SUSP: 30.0000 mL | ORAL | Status: DC | PRN

## 2014-09-10 MED ORDER — INSULIN ASPART 100 UNIT/ML ~~LOC~~ SOLN: 20.0000 [IU] | Freq: Three times a day (TID) | SUBCUTANEOUS | Status: DC

## 2014-09-10 MED ORDER — EXENATIDE 5 MCG/0.02ML ~~LOC~~ SOPN: 5.0000 ug | PEN_INJECTOR | Freq: Two times a day (BID) | SUBCUTANEOUS | Status: DC

## 2014-09-10 MED ORDER — ZOLPIDEM TARTRATE 5 MG PO TABS
5.0000 mg | ORAL_TABLET | Freq: Every evening | ORAL | Status: DC | PRN
  Administered 2014-09-10: 5 mg via ORAL
  Filled 2014-09-10: qty 1

## 2014-09-10 NOTE — ED Provider Notes (Signed)
Medical screening examination/treatment/procedure(s) were performed by non-physician practitioner and as supervising physician I was immediately available for consultation/collaboration.   EKG Interpretation None        Hoy Morn, MD 09/10/14 1525
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
## 2014-09-10 NOTE — ED Notes (Signed)
Patient given a snack

## 2014-09-10 NOTE — ED Provider Notes (Signed)
CSN: [id number]     Arrival date & time 09/10/14  1206 History  This chart was scribed for [employer], PA-C with Hoy Morn, MD by Edison Simon, ED Scribe. This patient was seen in room [facility] and the patient's care was started at 1:13 PM.    Chief Complaint  Patient presents with  . Hallucinations  . Homicidal   Patient is a 33 y.o. female presenting with mental health disorder. The history is provided by the patient. No language interpreter was used.  Mental Health Problem Presenting symptoms: hallucinations and homicidal ideas   Presenting symptoms: no self mutilation and no suicidal thoughts   Degree of incapacity (severity):  Mild Onset quality:  Sudden Progression:  Resolved Chronicity:  Recurrent Context: recent medication change   Context: not alcohol use and not drug abuse   Treatment compliance:  All of the time Relieved by:  Nothing Worsened by:  Nothing tried Ineffective treatments:  None tried Associated symptoms: headaches   Associated symptoms: no abdominal pain and no chest pain   Risk factors: hx of mental illness    HPI Comments: Cynthia Rosales is a 33 y.o. female with a PMHx of HTN, DM2, asthma, Ferguson disease, depression, bipolar disorder and schizophrenia, who presents to the Emergency Department complaining of hearing voices last night calling her name and telling her to take up and telling her to "get it," referring to a knife in a dream to hurt her boyfriend. She denies hallucinations today. She denies other homicidal ideation. She denies suicidal ideation. She states she has heard voices before but states they have not told her to harm anyone in the past. She states she has been using Remeron and Vistaril for 1 month, last used last night. She states her doctor recently took her off them and put her on Zoloft and Abilify but has not used them yet. She denies visual hallucinations. She denies self-harm. She reports shortness of breath and  diaphoresis upon waking last night that subsided; she denies any SOB today. She reports aching headache behind her right eye today; she states it is not as bad as her usual headaches, and is a dull pain, not the worst headache of her life, nonradiating, with no known aggravating or alleviating factors. Has not taken any home meds today. She denies alcohol, illicit drug, or cigarette use. She reports history of diabetes and sickle cell, well controlled with home meds. She denies fevers, chills, CP, SOB, vision changes, syncope, lightheadedness, abd pain, nausea, vomiting, diarrhea, hematuria, dysuria, or myalgias/arthralgias.  Past Medical History  Diagnosis Date  . Hypertension   . Asthma   . Type II diabetes mellitus   . History of blood transfusion     "after I had one of my kids"  . Sickle cell disease   . Anemia   . Depression   . Ovarian cyst   . Renal disorder     kidney stones   Past Surgical History  Procedure Laterality Date  . Cesarean section  2000; 2007; 2011  . Dilation and curettage of uterus    . Appendectomy  2013  . Reduction mammaplasty Bilateral 1998   No family history on file. History  Substance Use Topics  . Smoking status: Former Smoker -- 1 years    Types: Cigarettes  . Smokeless tobacco: Never Used     Comment: 03/27/2014 "smoked ~ 1 cigarette/day; quit in ~ 2013"  . Alcohol Use: No   OB History   Grav Para  Term Preterm Abortions TAB SAB Ect Mult Living   1              Review of Systems  Constitutional: Positive for diaphoresis. Negative for fever.  HENT: Negative for congestion and sinus pressure.   Eyes: Negative for photophobia and visual disturbance.  Respiratory: Negative for shortness of breath.   Cardiovascular: Negative for chest pain.  Gastrointestinal: Negative for nausea, vomiting, abdominal pain, diarrhea, constipation and abdominal distention.  Genitourinary: Negative for urgency, hematuria, flank pain, decreased urine volume and  difficulty urinating.  Musculoskeletal: Negative for arthralgias and myalgias.  Skin: Negative for color change.  Neurological: Positive for headaches. Negative for dizziness, syncope, weakness and light-headedness.  Psychiatric/Behavioral: Positive for homicidal ideas and hallucinations. Negative for suicidal ideas, confusion and self-injury.  A complete 10 system review of systems was obtained and all systems are negative except as noted in the HPI and PMH.    Allergies  Amoxicillin  Home Medications   Prior to Admission medications   Medication Sig Start Date End Date Taking? Authorizing Provider  exenatide (BYETTA) 5 MCG/0.02ML SOPN injection Inject 5 mcg into the skin 2 (two) times daily with a meal.    Historical Provider, MD  HYDROcodone-acetaminophen (NORCO/VICODIN) 5-325 MG per tablet Take 1 tablet by mouth every 4 (four) hours as needed for moderate pain.    Historical Provider, MD  insulin aspart (NOVOLOG) 100 UNIT/ML injection Inject 20 Units into the skin 3 (three) times daily.     Historical Provider, MD  insulin glargine (LANTUS) 100 UNIT/ML injection Inject 40 Units into the skin 2 (two) times daily.     Historical Provider, MD  meloxicam (MOBIC) 15 MG tablet Take 1 tablet (15 mg total) by mouth daily. 07/13/14   Ruthell Rummage Dammen, PA-C  metFORMIN (GLUCOPHAGE) 1000 MG tablet Take 1,000 mg by mouth 2 (two) times daily.    Historical Provider, MD  metroNIDAZOLE (FLAGYL) 500 MG tablet Take 1 tablet (500 mg total) by mouth 2 (two) times daily. 07/13/14   Ruthell Rummage Dammen, PA-C  ondansetron (ZOFRAN ODT) 8 MG disintegrating tablet Take 1 tablet (8 mg total) by mouth every 8 (eight) hours as needed for nausea or vomiting. 06/19/14   Hoy Morn, MD  oxyCODONE-acetaminophen (PERCOCET/ROXICET) 5-325 MG per tablet Take 1 tablet by mouth every 6 (six) hours as needed for severe pain. 07/13/14   Martie Lee, PA-C   Medications  . metoprolol tartrate (LOPRESSOR) tablet 25 mg    Sig:   .  oxyCODONE-acetaminophen (PERCOCET/ROXICET) 5-325 MG per tablet    Sig: Take 1 tablet by mouth every 6 (six) hours as needed for severe pain (pain).    BP 170/102  Pulse 108  Temp(Src) 98 F (36.7 C) (Oral)  Resp 22  SpO2 99%  LMP 08/19/2014 Physical Exam  Nursing note and vitals reviewed. Constitutional: She is oriented to person, place, and time. Vital signs are normal. She appears well-developed. No distress.  Obese, hypertension noted. NAD  HENT:  Head: Normocephalic and atraumatic.  Mouth/Throat: Mucous membranes are normal.  Eyes: Conjunctivae and EOM are normal. Pupils are equal, round, and reactive to light. Right eye exhibits no discharge. Left eye exhibits no discharge.  Neck: Normal range of motion. Neck supple.  Cardiovascular: Normal rate, regular rhythm, normal heart sounds and intact distal pulses.  Exam reveals no gallop and no friction rub.   No murmur heard. Pulmonary/Chest: Effort normal and breath sounds normal. No respiratory distress. She has no decreased breath  sounds. She has no wheezes. She has no rhonchi. She has no rales.  CTAB in all lung fields  Abdominal: Soft. Normal appearance and bowel sounds are normal. She exhibits no distension. There is no tenderness. There is no rigidity, no rebound and no guarding.  Musculoskeletal: Normal range of motion.  Neurological: She is alert and oriented to person, place, and time. She has normal strength. No cranial nerve deficit or sensory deficit.  Strength and sensation at baseline. CN 2-12 grossly intact  Skin: Skin is warm, dry and intact. No rash noted.  Psychiatric: She has a normal mood and affect. She is actively hallucinating. She expresses homicidal ideation. She expresses no homicidal plans.  Auditory hallucinations reported    ED Course  Procedures (including critical care time) Labs Review Labs Reviewed  CBC - Abnormal; Notable for the following:    Hemoglobin 11.2 (*)    HCT 34.4 (*)    MCV 70.9  (*)    MCH 23.1 (*)    RDW 17.1 (*)    All other components within normal limits  COMPREHENSIVE METABOLIC PANEL - Abnormal; Notable for the following:    Glucose, Bld 261 (*)    Albumin 2.7 (*)    Total Bilirubin <0.2 (*)    All other components within normal limits  SALICYLATE LEVEL - Abnormal; Notable for the following:    Salicylate Lvl <4.2 (*)    All other components within normal limits  URINE RAPID DRUG SCREEN (HOSP PERFORMED) - Abnormal; Notable for the following:    Tetrahydrocannabinol POSITIVE (*)    All other components within normal limits  ACETAMINOPHEN LEVEL  ETHANOL    Imaging Review No results found.   EKG Interpretation None     DIAGNOSTIC STUDIES: Oxygen Saturation is 99% on room air, normal by my interpretation.    COORDINATION OF CARE: 1:20 PM Discussed treatment plan with patient at beside, including re-ordering home medications and consulting with behavioral health. Discussed with her that she is not under IVC at this time, though we are concerned because she is hearing voices telling her to harm others. The patient agrees with the plan and has no further questions at this time.    MDM   Final diagnoses:  Hallucinations  Homicidal ideations    33y/o female with auditory command hallucinations here voluntarily. Screening labs all at baseline with no acute abnormalities. Pt had HA which she stated wasn't the worst of her life but with her BP being elevated, we kept her in FT room and gave her home BP meds which improved her BP and her HA resolved. Doubt need for head CT. Pt is cleared for TTS consult and psych hold. Stable at this time.   I personally performed the services described in this documentation, which was scribed in my presence. The recorded information has been reviewed and is accurate.   BP 143/93  Pulse 108  Temp(Src) 98 F (36.7 C) (Oral)  Resp 22  SpO2 99%  LMP 08/19/2014  Meds ordered this encounter  Medications  .  metoprolol tartrate (LOPRESSOR) tablet 25 mg    Sig:   . oxyCODONE-acetaminophen (PERCOCET/ROXICET) 5-325 MG per tablet    Sig: Take 1 tablet by mouth every 6 (six) hours as needed for severe pain (pain).  . LORazepam (ATIVAN) tablet 1 mg    Sig:   . acetaminophen (TYLENOL) tablet 650 mg    Sig:   . ibuprofen (ADVIL,MOTRIN) tablet 600 mg    Sig:   .  zolpidem (AMBIEN) tablet 5 mg    Sig:   . ondansetron (ZOFRAN) tablet 4 mg    Sig:   . alum & mag hydroxide-simeth (MAALOX/MYLANTA) 200-200-20 MG/5ML suspension 30 mL    Sig:   . exenatide (BYETTA) injection 5 mcg    Sig:   . insulin aspart (novoLOG) injection 20 Units    Sig:   . insulin glargine (LANTUS) injection 50 Units    Sig:   . metFORMIN (GLUCOPHAGE) tablet 1,000 mg    Sig:      Patty Sermons Camprubi-Soms, PA-C 09/10/14 1520

## 2014-09-10 NOTE — ED Notes (Addendum)
Pt states she is hearing voices and the voices are telling her to hurt someone. Pt c/o auditory hallucination. Pt denies SI. Pt came with crisis.

## 2014-09-11 ENCOUNTER — Encounter (HOSPITAL_COMMUNITY): Payer: Self-pay | Admitting: Registered Nurse

## 2014-09-11 DIAGNOSIS — F515 Nightmare disorder: Secondary | ICD-10-CM | POA: Diagnosis present

## 2014-09-11 LAB — CBG MONITORING, ED
Glucose-Capillary: 89 mg/dL (ref 70–99)
Glucose-Capillary: 170 mg/dL — ABNORMAL HIGH (ref 70–99)
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
## 2014-09-11 NOTE — Discharge Instructions (Signed)
Night Terror  A night terror is a sleep disorder characterized by extreme fright and a temporary inability to fully wake up. Although this happens mostly in children 35 to 35 years of age, with the peak at 4 to 6 years, any age can be affected. The terrors usually begin about 90 minutes after falling asleep.  Night terrors are different than nightmares. Nightmares are scary dreams. Most children have them occasionally. Some children have them more than once a week. Nightmares usually happen late in the sleep period towards morning. Night terrors are frightening episodes that disrupt family life. They usually only last a couple minutes. These short episodes seem extremely long because they are terrifying to those around them. When the episode is finished, your child will normally settle back to sleep without really waking up. Unlike nightmares, most children do not usually remember a night terror episode the next morning. Your child may come to you for comfort. Usually they can tell you about a dream, and why it was scary. CAUSES   A stressful physical or emotional event.  Fever.  Lack of sleep.  Medications that affect the brain. Children eventually outgrow these terrors as they reach adolescence. They are usually not caused by mental or physical illness.  SYMPTOMS   Your child will appear to suddenly wake from their sleep with gasping, moaning or screaming.  It is often impossible to fully wake the child. Although they may seem awake, your child will remain dazed or confused.  Your child may thrash around in bed and be unresponsive to you.  Your child will not seem to be aware of your presence and usually will not talk.  The terror may also cause a rapid heart rate and breathing along with sweating. DIAGNOSIS  Usually, a complete history and a physical exam are enough to diagnose night terrors. Your caregiver may order other tests if other problems are suspected. TREATMENT  Treating night  terror episodes requires gentleness.   Remove anything in the sleeping area that could hurt your child.  Avoid loud voices or movements that might frighten your child further. Remember, your is not aware they are having a night terror.  Your child may become more agitated if told that "it was just a dream." They feel it is very real. Calm your child by telling him/her, "I am here" or "I love you."  It is best if one parent stays with the child until the episode passes and the child is calm again and ready to go back to sleep. Medical Treatment  Adequate treatment does not exist for night terrors. In severe cases, tricyclic anti-depressants may be used as a temporary treatment. They are usually only prescribed when waking behavior is being affected.  Educate your family about the disorder. Reassure them that the episodes are not harmful.  HOME CARE INSTRUCTIONS   Prevent your child from being injured during an episode.  Be sure your home and child's room is safe (use toddler gates on staircases).  Do not use bunk beds for children who often have nightmares or night terrors.  Talk to your caregiver if your child ever gets hurt while sleeping. They may want to study your child during sleep.  Eliminate all sources of sleep disturbance.  Keep bedtimes and wake-up times routine. If your child has several night terrors, you can try to interrupt their sleep in order to prevent the night terror.   Keep track how many minutes the night terror begins from when your  child falls asleep.  Then, awaken your child 15 minutes before the expected night terror. Then keep them awake and out of bed for 5 minutes.  Continue this trial for a week. SEEK MEDICAL CARE IF:   The problems continue and medications or other measures are not working. Document Released: 10/09/2005 Document Revised: 02/08/2012 Document Reviewed: 02/22/2009 [facility] Patient Information 2015 [place], [place]. This information is  not intended to replace advice given to you by your health care provider. Make sure you discuss any questions you have with your health care provider.

## 2014-09-11 NOTE — ED Notes (Signed)
Patient c/o "dizzinesss" and feels like her BP or blood sugar is "off".  OJ given with crackers and peanut butter. Assisted with ambulation.  Continue 15' checks.

## 2014-09-11 NOTE — [facility] Suicide Risk Assessment (Cosign Needed)
Suicide Risk Assessment  Discharge Assessment     Demographic Factors:  female, black  Total Time spent with patient: 30 minutes  Psychiatric Specialty Exam:     Blood pressure 142/78, pulse 102, temperature 99.3 F (37.4 C), temperature source Oral, resp. rate 20, last menstrual period 08/19/2014, SpO2 99.00%, unknown if currently breastfeeding.There is no weight on file to calculate BMI.   General Appearance: Casual   Eye Contact:: Good   Speech: Clear and Coherent and Normal Rate   Volume: Normal   Mood: Anxious   Affect: Appropriate and Congruent   Thought Process: Circumstantial, Goal Directed and Logical   Orientation: Full (Time, Place, and Person)   Thought Content: WDL   Suicidal Thoughts: No   Homicidal Thoughts: No   Memory: Immediate; Good  Recent; Good  Remote; Good   Judgement: Intact   Insight: Good and Present   Psychomotor Activity: Normal   Concentration: Fair   Recall: Good   Fund of Knowledge:Good   Language: Good   Akathisia: No   Handed: Right   AIMS (if indicated):   Assets: Communication Skills  Desire for Improvement  Housing  Social Support  Transportation   Sleep:   Musculoskeletal:  Strength & Muscle Tone: within normal limits  Gait & Station: normal  Patient leans: N/A    Mental Status Per Nursing Assessment::   On Admission:     Current Mental Status by Physician: Patient denies suicidal/homicidal ideation, psychosis, and paranoia  Loss Factors: NA  Historical Factors: NA  Risk Reduction Factors:   Responsible for children under 51 years of age, Sense of responsibility to family, Living with another person, especially a relative, Positive social support, Positive therapeutic relationship and Positive coping skills or problem solving skills  Continued Clinical Symptoms:  Previous Psychiatric Diagnoses and Treatments Medical Diagnoses and Treatments/Surgeries  Cognitive Features That Contribute To Risk:  None noted     Suicide Risk:  Minimal: No identifiable suicidal ideation.  Patients presenting with no risk factors but with morbid ruminations; may be classified as minimal risk based on the severity of the depressive symptoms  Discharge Diagnoses:  AXIS I: Nightmares  AXIS II: Deferred  AXIS III:  Past Medical History   Diagnosis  Date   .  Hypertension    .  Asthma    .  Type II diabetes mellitus    .  History of blood transfusion      "after I had one of my kids"   .  Sickle cell disease    .  Anemia    .  Depression    .  Ovarian cyst    .  Renal disorder      kidney stones    AXIS IV: other psychosocial or environmental problems  AXIS V: 61-70 mild symptoms  Plan Of Care/Follow-up recommendations:  Activity:  as tolerated Diet:  as tolerated  Is patient on multiple antipsychotic therapies at discharge:  No   Has Patient had three or more failed trials of antipsychotic monotherapy by history:  No  Recommended Plan for Multiple Antipsychotic Therapies: NA    Jozy Mcphearson, FNP-BC 09/11/2014, 2:21 PM

## 2014-09-12 NOTE — Consult Note (Signed)
Face to face evaluation and I agree with this note 

## 2014-09-20 ENCOUNTER — Emergency Department (HOSPITAL_COMMUNITY)
Admission: EM | Admit: 2014-09-20 | Discharge: 2014-09-20 | Disposition: A | Discharge status: Home / Self Care | Payer: Medicaid Other | Attending: Emergency Medicine | Admitting: Emergency Medicine

## 2014-09-20 ENCOUNTER — Encounter (HOSPITAL_COMMUNITY): Payer: Self-pay | Admitting: Emergency Medicine

## 2014-09-20 DIAGNOSIS — Z862 Personal history of diseases of the blood and blood-forming organs and certain disorders involving the immune mechanism: Secondary | ICD-10-CM | POA: Insufficient documentation

## 2014-09-20 DIAGNOSIS — Z880 Allergy status to penicillin: Secondary | ICD-10-CM | POA: Insufficient documentation

## 2014-09-20 DIAGNOSIS — Z87442 Personal history of urinary calculi: Secondary | ICD-10-CM | POA: Insufficient documentation

## 2014-09-20 DIAGNOSIS — N8320 Unspecified ovarian cysts: Secondary | ICD-10-CM | POA: Insufficient documentation

## 2014-09-20 DIAGNOSIS — Z79899 Other long term (current) drug therapy: Secondary | ICD-10-CM | POA: Diagnosis not present

## 2014-09-20 DIAGNOSIS — Z87891 Personal history of nicotine dependence: Secondary | ICD-10-CM | POA: Diagnosis not present

## 2014-09-20 DIAGNOSIS — Z794 Long term (current) use of insulin: Secondary | ICD-10-CM | POA: Diagnosis not present

## 2014-09-20 DIAGNOSIS — I10 Essential (primary) hypertension: Secondary | ICD-10-CM | POA: Diagnosis not present

## 2014-09-20 DIAGNOSIS — J45909 Unspecified asthma, uncomplicated: Secondary | ICD-10-CM | POA: Diagnosis not present

## 2014-09-20 DIAGNOSIS — E119 Type 2 diabetes mellitus without complications: Secondary | ICD-10-CM | POA: Diagnosis not present

## 2014-09-20 DIAGNOSIS — R51 Headache: Secondary | ICD-10-CM | POA: Diagnosis present

## 2014-09-20 DIAGNOSIS — G43809 Other migraine, not intractable, without status migrainosus: Secondary | ICD-10-CM | POA: Insufficient documentation

## 2014-09-20 DIAGNOSIS — F329 Major depressive disorder, single episode, unspecified: Secondary | ICD-10-CM | POA: Diagnosis not present

## 2014-09-20 MED ORDER — ACETAMINOPHEN 325 MG PO TABS
650.0000 mg | ORAL_TABLET | Freq: Once | ORAL | Status: AC
  Administered 2014-09-20: 650 mg via ORAL
  Filled 2014-09-20: qty 2

## 2014-09-20 MED ORDER — PROMETHAZINE HCL 25 MG/ML IJ SOLN
12.5000 mg | Freq: Four times a day (QID) | INTRAMUSCULAR | Status: DC | PRN
  Administered 2014-09-20: 12.5 mg via INTRAVENOUS
  Filled 2014-09-20: qty 1

## 2014-09-20 MED ORDER — KETOROLAC TROMETHAMINE 30 MG/ML IJ SOLN
30.0000 mg | Freq: Once | INTRAMUSCULAR | Status: AC
  Administered 2014-09-20: 30 mg via INTRAVENOUS
  Filled 2014-09-20: qty 1

## 2014-09-20 MED ORDER — SODIUM CHLORIDE 0.9 % IV BOLUS (SEPSIS)
1000.0000 mL | Freq: Once | INTRAVENOUS | Status: AC
  Administered 2014-09-20: 1000 mL via INTRAVENOUS

## 2014-09-20 MED ORDER — PROCHLORPERAZINE EDISYLATE 5 MG/ML IJ SOLN
10.0000 mg | Freq: Four times a day (QID) | INTRAMUSCULAR | Status: DC | PRN
  Administered 2014-09-20: 10 mg via INTRAVENOUS
  Filled 2014-09-20: qty 2

## 2014-09-20 MED ORDER — KETOROLAC TROMETHAMINE 30 MG/ML IJ SOLN: 30.0000 mg | Freq: Once | INTRAMUSCULAR | Status: DC

## 2014-09-20 NOTE — ED Notes (Signed)
Pt c/o right sided HA with nausea and blurred vision x 2 weeks worse over last week; pt with photophobia
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
## 2014-09-20 NOTE — ED Provider Notes (Signed)
Medical screening examination/treatment/procedure(s) were conducted as a shared visit with non-physician practitioner(s) and myself.  I personally evaluated the patient during the encounter.   EKG Interpretation None      Atypical HA. Doubt SAH. Normal neuro exam. Outpatient neuro evaluation pending in near future. Will dc home at this time  Hoy Morn, MD 09/20/14 [phone]

## 2014-09-20 NOTE — Discharge Instructions (Signed)

## 2014-09-25 ENCOUNTER — Ambulatory Visit (INDEPENDENT_AMBULATORY_CARE_PROVIDER_SITE_OTHER): Payer: Self-pay

## 2014-09-25 ENCOUNTER — Ambulatory Visit (INDEPENDENT_AMBULATORY_CARE_PROVIDER_SITE_OTHER): Payer: Medicaid Other | Admitting: Neurology

## 2014-09-25 DIAGNOSIS — G5601 Carpal tunnel syndrome, right upper limb: Secondary | ICD-10-CM

## 2014-09-25 DIAGNOSIS — Z0289 Encounter for other administrative examinations: Secondary | ICD-10-CM

## 2014-09-25 DIAGNOSIS — G5603 Carpal tunnel syndrome, bilateral upper limbs: Secondary | ICD-10-CM

## 2014-09-25 DIAGNOSIS — G5602 Carpal tunnel syndrome, left upper limb: Secondary | ICD-10-CM

## 2014-09-25 NOTE — Progress Notes (Signed)
  [facility]    Provider:  Dr Jaynee Eagles Referring Provider: Rogers Blocker, MD Primary Care Physician:  [facility]  History:  Cynthia Rosales is a 33 y.o. female here as a referral from Dr. Marlou Sa for upper extremity numbness and pain. She reports hand numbness bilaterally with neck pain and radicular symptoms. Symptoms started about 1.5 years ago. All the fingers feel numb, shooting pains, coldness that is worse in the middle of the night. She wakes up with numbness and moves her hands around to try to get feeling back.  Endorses weakness and dropping objects. Right hand worse than left.   Summary:  Nerve Conduction Studies were performed on the bilateral upper extremities.   The right median motor nerve showed prolonged distal onset latency (5.7 ms, N<4.0).  The left median motor nerve showed prolonged distal onset latency (4.5 ms, N<4.0).  The right median F wave showed prolonged latency (35.72ms, N<34)  The left median F wave showed normal latency. The bilateral Ulnar ADM motor nerves were within normal limits with normal F Wave latencies.  The bilateral Ulnar 5th digit sensory nerves were within normal limits.  The right Median 2nd Digit sensory nerve showed prolonged distal peak latency (6.2 ms, N<3.9). The left Median 2nd Digit sensory nerve showed prolonged distal peak latency (4.3 ms, N<3.9).  EMG needle study of selected right and left upper extremity and paraspinal muscles was performed. The right Opponens pollicis and Right N3/Z7 paraspinal muscles showed increased spontaneous activity. The following muscles were normal: right Deltoid, right Triceps, right Pronator Teres, right First Dorsal Interosseous, right Extensor Indicis, left Opponens Pollicis, left First Dorsal Interosseous, left Extensor Indicis.   Summary: There is electrophysiologic evidence for a severe right Carpal Tunnel Syndrome and a moderately-severe left Carpal Tunnel Syndrome.  Needle examination shows acute/ongoing denervation in right lower-cervical paraspinal muscles which is consistent with cervical radiculopathy. The affected cervical root cannot be localized by paraspinal level but does raise the possibility of a double crush syndrome.  Consider MRI of the cervical spine as clinically warranted.   Cynthia Ill, MD  [facility] [address] [place], [place] [address]  Phone [phone] Fax [phone]

## 2014-09-27 ENCOUNTER — Emergency Department (HOSPITAL_COMMUNITY): Payer: Medicaid Other

## 2014-09-27 ENCOUNTER — Encounter (HOSPITAL_COMMUNITY): Payer: Self-pay | Admitting: Emergency Medicine

## 2014-09-27 ENCOUNTER — Emergency Department (HOSPITAL_COMMUNITY)
Admission: EM | Admit: 2014-09-27 | Discharge: 2014-09-27 | Disposition: A | Discharge status: Home / Self Care | Payer: Medicaid Other | Attending: Emergency Medicine | Admitting: Emergency Medicine

## 2014-09-27 DIAGNOSIS — Z880 Allergy status to penicillin: Secondary | ICD-10-CM | POA: Diagnosis not present

## 2014-09-27 DIAGNOSIS — Z862 Personal history of diseases of the blood and blood-forming organs and certain disorders involving the immune mechanism: Secondary | ICD-10-CM | POA: Diagnosis not present

## 2014-09-27 DIAGNOSIS — E119 Type 2 diabetes mellitus without complications: Secondary | ICD-10-CM | POA: Diagnosis not present

## 2014-09-27 DIAGNOSIS — Z87448 Personal history of other diseases of urinary system: Secondary | ICD-10-CM | POA: Diagnosis not present

## 2014-09-27 DIAGNOSIS — M542 Cervicalgia: Secondary | ICD-10-CM | POA: Insufficient documentation

## 2014-09-27 DIAGNOSIS — R519 Headache, unspecified: Secondary | ICD-10-CM

## 2014-09-27 DIAGNOSIS — Z794 Long term (current) use of insulin: Secondary | ICD-10-CM | POA: Insufficient documentation

## 2014-09-27 DIAGNOSIS — I10 Essential (primary) hypertension: Secondary | ICD-10-CM | POA: Insufficient documentation

## 2014-09-27 DIAGNOSIS — G8929 Other chronic pain: Secondary | ICD-10-CM

## 2014-09-27 DIAGNOSIS — F329 Major depressive disorder, single episode, unspecified: Secondary | ICD-10-CM | POA: Insufficient documentation

## 2014-09-27 DIAGNOSIS — Z79899 Other long term (current) drug therapy: Secondary | ICD-10-CM | POA: Insufficient documentation

## 2014-09-27 DIAGNOSIS — Z87891 Personal history of nicotine dependence: Secondary | ICD-10-CM | POA: Diagnosis not present

## 2014-09-27 DIAGNOSIS — R51 Headache: Secondary | ICD-10-CM | POA: Diagnosis not present

## 2014-09-27 DIAGNOSIS — Z8742 Personal history of other diseases of the female genital tract: Secondary | ICD-10-CM | POA: Diagnosis not present

## 2014-09-27 DIAGNOSIS — J45909 Unspecified asthma, uncomplicated: Secondary | ICD-10-CM | POA: Insufficient documentation

## 2014-09-27 LAB — CBC
HCT: 33.2 % — ABNORMAL LOW (ref 36.0–46.0)
Hemoglobin: 10.8 g/dL — ABNORMAL LOW (ref 12.0–15.0)
MCH: 22.9 pg — ABNORMAL LOW (ref 26.0–34.0)
MCHC: 32.5 g/dL (ref 30.0–36.0)
MCV: 70.5 fL — ABNORMAL LOW (ref 78.0–100.0)
Platelets: 336 10*3/uL (ref 150–400)
RBC: 4.71 MIL/uL (ref 3.87–5.11)
RDW: 17.2 % — ABNORMAL HIGH (ref 11.5–15.5)
WBC: 13.2 10*3/uL — ABNORMAL HIGH (ref 4.0–10.5)

## 2014-09-27 LAB — BASIC METABOLIC PANEL
Anion gap: 13 (ref 5–15)
BUN: 11 mg/dL (ref 6–23)
CO2: 21 mEq/L (ref 19–32)
Calcium: 8.3 mg/dL — ABNORMAL LOW (ref 8.4–10.5)
Chloride: 104 mEq/L (ref 96–112)
Creatinine, Ser: 0.67 mg/dL (ref 0.50–1.10)
GFR calc Af Amer: >90 mL/min (ref >90)
GFR calc non Af Amer: >90 mL/min (ref >90)
Glucose, Bld: 199 mg/dL — ABNORMAL HIGH (ref 70–99)
Potassium: 3.9 mEq/L (ref 3.7–5.3)
Sodium: 138 mEq/L (ref 137–147)

## 2014-09-27 MED ORDER — CYCLOBENZAPRINE HCL 10 MG PO TABS: 10.0000 mg | ORAL_TABLET | Freq: Two times a day (BID) | ORAL | Status: DC | PRN

## 2014-09-27 MED ORDER — MORPHINE SULFATE 4 MG/ML IJ SOLN
8.0000 mg | Freq: Once | INTRAMUSCULAR | Status: AC
  Administered 2014-09-27: 8 mg via INTRAVENOUS
  Filled 2014-09-27: qty 2

## 2014-09-27 MED ORDER — METOCLOPRAMIDE HCL 5 MG/ML IJ SOLN
10.0000 mg | Freq: Once | INTRAMUSCULAR | Status: AC
  Administered 2014-09-27: 10 mg via INTRAVENOUS
  Filled 2014-09-27: qty 2

## 2014-09-27 MED ORDER — KETOROLAC TROMETHAMINE 30 MG/ML IJ SOLN
30.0000 mg | Freq: Once | INTRAMUSCULAR | Status: AC
  Administered 2014-09-27: 30 mg via INTRAVENOUS
  Filled 2014-09-27: qty 1

## 2014-09-27 MED ORDER — DIPHENHYDRAMINE HCL 50 MG/ML IJ SOLN
25.0000 mg | Freq: Once | INTRAMUSCULAR | Status: AC
  Administered 2014-09-27: 25 mg via INTRAVENOUS
  Filled 2014-09-27: qty 1

## 2014-09-27 MED ORDER — IOHEXOL 350 MG/ML SOLN
100.0000 mL | Freq: Once | INTRAVENOUS | Status: AC | PRN
  Administered 2014-09-27: 100 mL via INTRAVENOUS

## 2014-09-27 MED ORDER — DEXAMETHASONE SODIUM PHOSPHATE 10 MG/ML IJ SOLN
10.0000 mg | Freq: Once | INTRAMUSCULAR | Status: AC
  Administered 2014-09-27: 10 mg via INTRAVENOUS
  Filled 2014-09-27: qty 1

## 2014-09-27 MED ORDER — HYDROCODONE-ACETAMINOPHEN 5-325 MG PO TABS: 1.0000 | ORAL_TABLET | Freq: Four times a day (QID) | ORAL | Status: DC | PRN

## 2014-09-27 NOTE — Discharge Instructions (Signed)

## 2014-09-27 NOTE — ED Notes (Signed)
Bed: WA07 Expected date:  Expected time:  Means of arrival:  Comments: headache

## 2014-09-27 NOTE — ED Notes (Signed)
Per EMS pt c/o right side headache, states she can "hear a cracking noise," right arm numbness, dizziness worsening with movement, all of which started 3 weeks ago, was seen [facility] last week for same symptoms. Pt saw neurologist, neurologist states symptoms is due to pinched nerve in her neck, states numbness is due to neuropathy. Pt was seen at PCP today, PCP sent her here for hypertension 162/104 and gave her 0.1 mg Clonidine and 100 mg Labetalol at 1500 with no effect. Pt has Hx of HTN, states she is compliant with medications.
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
## 2014-09-27 NOTE — ED Notes (Signed)
Patient transported to CT 

## 2014-10-01 ENCOUNTER — Encounter (HOSPITAL_COMMUNITY): Payer: Self-pay | Admitting: Emergency Medicine

## 2014-11-16 ENCOUNTER — Encounter: Payer: Self-pay | Admitting: *Deleted

## 2014-11-19 ENCOUNTER — Encounter: Payer: Self-pay | Admitting: Neurology

## 2014-11-19 ENCOUNTER — Ambulatory Visit (INDEPENDENT_AMBULATORY_CARE_PROVIDER_SITE_OTHER): Payer: Medicaid Other | Admitting: Neurology

## 2014-11-19 VITALS — BP 148/102 | HR 107 | Ht 66.0 in | Wt 343.2 lb

## 2014-11-19 DIAGNOSIS — M6289 Other specified disorders of muscle: Secondary | ICD-10-CM

## 2014-11-19 DIAGNOSIS — G932 Benign intracranial hypertension: Secondary | ICD-10-CM

## 2014-11-19 DIAGNOSIS — R202 Paresthesia of skin: Secondary | ICD-10-CM

## 2014-11-19 DIAGNOSIS — R531 Weakness: Secondary | ICD-10-CM

## 2014-11-19 DIAGNOSIS — R0683 Snoring: Secondary | ICD-10-CM

## 2014-11-19 DIAGNOSIS — R51 Headache: Secondary | ICD-10-CM

## 2014-11-19 DIAGNOSIS — G4489 Other headache syndrome: Secondary | ICD-10-CM

## 2014-11-19 DIAGNOSIS — R519 Headache, unspecified: Secondary | ICD-10-CM | POA: Insufficient documentation

## 2014-11-19 DIAGNOSIS — R400 Somnolence: Secondary | ICD-10-CM

## 2014-11-19 DIAGNOSIS — M542 Cervicalgia: Secondary | ICD-10-CM

## 2014-11-19 DIAGNOSIS — G4710 Hypersomnia, unspecified: Secondary | ICD-10-CM

## 2014-11-19 MED ORDER — TOPIRAMATE 25 MG PO TABS
50.0000 mg | ORAL_TABLET | Freq: Two times a day (BID) | ORAL | Status: DC
Start: 2014-11-19 — End: 2015-01-02

## 2014-11-19 MED ORDER — TOPIRAMATE 25 MG PO TABS: 50.0000 mg | ORAL_TABLET | Freq: Two times a day (BID) | ORAL | Status: DC

## 2014-11-19 NOTE — Patient Instructions (Addendum)
Overall you are doing fairly well but I do want to suggest a few things today:   Remember to drink plenty of fluid, eat healthy meals and do not skip any meals. Try to eat protein with a every meal and eat a healthy snack such as fruit or nuts in between meals. Try to keep a regular sleep-wake schedule and try to exercise daily, particularly in the form of walking, 20-30 minutes a day, if you can.   As far as your medications are concerned, I would like to suggest: Topamax 50mg  in the morning and evening Week one: 25mg  at night(one tab) Week two: 50mg  at night(2 tabs) Week three: 25mg  in the morning and 50mg  at night Week four 50mg  in the morning and 50mg  in the evening  As far as diagnostic testing: MRI of the brain, MRV of the brain, MRi of the cervical spine, Lumbar puncture  I would like to see you back in 6 weeks, sooner if we need to. Please call us with any interim questions, concerns, problems, updates or refill requests.   Please also call us for any test results so we can go over those with you on the phone.  My clinical assistant and will answer any of your questions and relay your messages to me and also relay most of my messages to you.   Our phone number is [phone]. We also have an after hours call service for urgent matters and there is a physician on-call for urgent questions. For any emergencies you know to call 911 or go to the nearest emergency room
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
## 2014-11-20 LAB — COMPREHENSIVE METABOLIC PANEL
ALT: 12 IU/L (ref 0–32)
AST: 10 IU/L (ref 0–40)
Albumin/Globulin Ratio: 1 — ABNORMAL LOW (ref 1.1–2.5)
Albumin: 3.3 g/dL — ABNORMAL LOW (ref 3.5–5.5)
Alkaline Phosphatase: 96 IU/L (ref 39–117)
BUN/Creatinine Ratio: 14 (ref 8–20)
BUN: 10 mg/dL (ref 6–20)
CO2: 22 mmol/L (ref 18–29)
Calcium: 8.5 mg/dL — ABNORMAL LOW (ref 8.7–10.2)
Chloride: 103 mmol/L (ref 97–108)
Creatinine, Ser: 0.72 mg/dL (ref 0.57–1.00)
GFR calc Af Amer: 125 mL/min/{1.73_m2} (ref >59)
GFR calc non Af Amer: 109 mL/min/{1.73_m2} (ref >59)
Globulin, Total: 3.4 g/dL (ref 1.5–4.5)
Glucose: 233 mg/dL — ABNORMAL HIGH (ref 65–99)
Potassium: 4.3 mmol/L (ref 3.5–5.2)
Sodium: 140 mmol/L (ref 134–144)
Total Bilirubin: 0.2 mg/dL (ref 0.0–1.2)
Total Protein: 6.7 g/dL (ref 6.0–8.5)

## 2014-11-26 ENCOUNTER — Telehealth: Payer: Self-pay | Admitting: Neurology

## 2014-11-26 NOTE — Telephone Encounter (Signed)
Called and infomred patient her glucose is high and some mild decrease in albumin and calcium (calcium probably normal if corrected)

## 2014-11-27 ENCOUNTER — Telehealth: Payer: Self-pay | Admitting: Neurology

## 2014-11-27 DIAGNOSIS — R0683 Snoring: Secondary | ICD-10-CM

## 2014-11-27 DIAGNOSIS — R51 Headache: Secondary | ICD-10-CM

## 2014-11-27 DIAGNOSIS — G4719 Other hypersomnia: Secondary | ICD-10-CM

## 2014-11-27 DIAGNOSIS — R519 Headache, unspecified: Secondary | ICD-10-CM

## 2014-11-27 NOTE — Telephone Encounter (Signed)
Dr. [name], refers patient for attended sleep study.  Height: 5'6"  Weight: 343 lb 3.2 oz   BMI: 55.42  Past Medical History:  Hypertension    . Asthma   . Type II diabetes mellitus   . History of blood transfusion     "after I had one of my kids"  . Sickle cell disease   . Anemia   . Depression   . Ovarian cyst   . Renal disorder     kidney stones     Sleep Symptoms: She has very bad snoring. She is excessively tired. Has gained 100 pounds. Morning headaches.    Epworth Score: Unable to get from patient   Medication:  HydrOXYzine Pamoate (Cap) VISTARIL 25 MG Take 25 mg by mouth at bedtime.       Insulin Aspart (Solution) novoLOG 100 UNIT/ML Inject 20 Units into the skin 3 (three) times daily.       Insulin Glargine (Solution) LANTUS 100 UNIT/ML Inject 50 Units into the skin 2 (two) times daily.       Losartan Potassium (Tab) COZAAR 25 MG Take 25 mg by mouth daily.      MetFORMIN HCl (Tab) GLUCOPHAGE 1000 MG Take 1,000 mg by mouth 2 (two) times daily.      Metoprolol Tartrate (Tab) LOPRESSOR 25 MG Take 25 mg by mouth daily.      Topiramate (Tab) TOPAMAX 25 MG Take 2 tablets (50 mg total) by mouth 2 (two) times daily.      Ins: Medicaid   Assessment & Plan: 33 year old female with PMHx of HTN, uncontrolled diabetes, severe obesity (BMI>55) who is here for evaluation of cervical radiculopathy, headache, right-sided weakness and paresthesias.  Bilateral carpal tunnel: following with [facility] and is going to see the hand surgeon. EMG/NCS showed severe right Carpal Tunnel Syndrome and a moderately-severe left Carpal Tunnel Syndrome. Recommend Neurontin for the pain and wrist splints at night in addition to follow-up with hand surgeon.   Neck pain: EMG/NCS showed acute/ongoing denervation in right lower-cervical paraspinal muscles which is consistent with cervical radiculopathy. She has weakness in  the right upper extremity on exam. Needs MRI of the cervical spine.   Headache: She describes worsening daily headache. Pressure, pulsatile tinnitus and vision changes. Need an MRi of the brain to evaluate in addition to MRV (she has multiple risk factors for thrombosis). She is vomiting daily due to the head pain. Possibly mild papilledema on exam, hard to see due to patient intolerance to light. Need to rule out IIH, can cause permanent vision lossLumbar puncture for opening pressure. Ophthalmology referral after Lumbar puncture if elevated opening pressure. Start Topamax. Weight loss. Blood pressure management.    Excessive daytime drowsiness: She has very bad snoring. She is excessively tired. Has gained 100 pounds. Morning headaches. BMI > 55. Order sleep study.    Please review patient information and submit instructions for scheduling and orders for sleep technologist. Thank you.

## 2014-11-27 NOTE — Telephone Encounter (Signed)
Sleep study request review: This patient has an underlying medical history of Hypertension, asthma, type 2 diabetes, morbid obesity,Depression,Kidney stones and sickle cell disease and is referred by Dr. Jaynee Eagles for an attended sleep study due to a report of Snoring, excessive daytime somnolence, morning headaches and significant weight gain. I will order a split-night sleep study and see the patient in sleep medicine consultation afterwards. Please print this note and attach to chart.   Technologist instructions: Please score at 4% and split if 2 hour estimated AHI >15/h.    Star Age, MD, PhD [facility])

## 2014-12-05 ENCOUNTER — Ambulatory Visit
Admission: RE | Admit: 2014-12-05 | Discharge: 2014-12-05 | Disposition: A | Discharge status: Home / Self Care | Payer: Medicaid Other | Source: Ambulatory Visit | Attending: Neurology | Admitting: Neurology

## 2014-12-05 ENCOUNTER — Other Ambulatory Visit: Payer: Self-pay

## 2014-12-05 ENCOUNTER — Encounter (INDEPENDENT_AMBULATORY_CARE_PROVIDER_SITE_OTHER): Payer: Medicaid Other | Admitting: Diagnostic Neuroimaging

## 2014-12-05 DIAGNOSIS — R202 Paresthesia of skin: Secondary | ICD-10-CM

## 2014-12-05 DIAGNOSIS — M6289 Other specified disorders of muscle: Secondary | ICD-10-CM

## 2014-12-05 DIAGNOSIS — G4489 Other headache syndrome: Secondary | ICD-10-CM

## 2014-12-05 DIAGNOSIS — R531 Weakness: Secondary | ICD-10-CM

## 2014-12-05 DIAGNOSIS — M542 Cervicalgia: Secondary | ICD-10-CM

## 2014-12-05 MED ORDER — GADOBENATE DIMEGLUMINE 529 MG/ML IV SOLN: 20.0000 mL | Freq: Once | INTRAVENOUS | Status: AC | PRN

## 2014-12-10 ENCOUNTER — Inpatient Hospital Stay (HOSPITAL_COMMUNITY)
Admission: EM | Admit: 2014-12-10 | Discharge: 2014-12-24 | DRG: 028 | Disposition: A | Discharge status: Rehabilitation Facility | Payer: Medicaid Other | Attending: Neurosurgery | Admitting: Neurosurgery

## 2014-12-10 ENCOUNTER — Telehealth: Payer: Self-pay | Admitting: Neurology

## 2014-12-10 ENCOUNTER — Encounter (HOSPITAL_COMMUNITY): Payer: Self-pay | Admitting: Emergency Medicine

## 2014-12-10 ENCOUNTER — Emergency Department (HOSPITAL_COMMUNITY): Payer: Medicaid Other

## 2014-12-10 ENCOUNTER — Other Ambulatory Visit: Payer: Self-pay | Admitting: Neurology

## 2014-12-10 DIAGNOSIS — Z4659 Encounter for fitting and adjustment of other gastrointestinal appliance and device: Secondary | ICD-10-CM | POA: Insufficient documentation

## 2014-12-10 DIAGNOSIS — Z95828 Presence of other vascular implants and grafts: Secondary | ICD-10-CM

## 2014-12-10 DIAGNOSIS — Y838 Other surgical procedures as the cause of abnormal reaction of the patient, or of later complication, without mention of misadventure at the time of the procedure: Secondary | ICD-10-CM | POA: Diagnosis not present

## 2014-12-10 DIAGNOSIS — Z803 Family history of malignant neoplasm of breast: Secondary | ICD-10-CM

## 2014-12-10 DIAGNOSIS — R229 Localized swelling, mass and lump, unspecified: Secondary | ICD-10-CM

## 2014-12-10 DIAGNOSIS — T829XXD Unspecified complication of cardiac and vascular prosthetic device, implant and graft, subsequent encounter: Secondary | ICD-10-CM

## 2014-12-10 DIAGNOSIS — K913 Postprocedural intestinal obstruction: Secondary | ICD-10-CM | POA: Diagnosis not present

## 2014-12-10 DIAGNOSIS — R000 Tachycardia, unspecified: Secondary | ICD-10-CM | POA: Diagnosis not present

## 2014-12-10 DIAGNOSIS — N390 Urinary tract infection, site not specified: Secondary | ICD-10-CM

## 2014-12-10 DIAGNOSIS — Z6841 Body Mass Index (BMI) 40.0 and over, adult: Secondary | ICD-10-CM

## 2014-12-10 DIAGNOSIS — E119 Type 2 diabetes mellitus without complications: Secondary | ICD-10-CM | POA: Diagnosis present

## 2014-12-10 DIAGNOSIS — R52 Pain, unspecified: Secondary | ICD-10-CM

## 2014-12-10 DIAGNOSIS — R531 Weakness: Secondary | ICD-10-CM

## 2014-12-10 DIAGNOSIS — R109 Unspecified abdominal pain: Secondary | ICD-10-CM

## 2014-12-10 DIAGNOSIS — K56609 Unspecified intestinal obstruction, unspecified as to partial versus complete obstruction: Secondary | ICD-10-CM | POA: Insufficient documentation

## 2014-12-10 DIAGNOSIS — J9811 Atelectasis: Secondary | ICD-10-CM | POA: Insufficient documentation

## 2014-12-10 DIAGNOSIS — D497 Neoplasm of unspecified behavior of endocrine glands and other parts of nervous system: Secondary | ICD-10-CM | POA: Diagnosis present

## 2014-12-10 DIAGNOSIS — E871 Hypo-osmolality and hyponatremia: Secondary | ICD-10-CM | POA: Diagnosis not present

## 2014-12-10 DIAGNOSIS — Z794 Long term (current) use of insulin: Secondary | ICD-10-CM

## 2014-12-10 DIAGNOSIS — K567 Ileus, unspecified: Secondary | ICD-10-CM | POA: Insufficient documentation

## 2014-12-10 DIAGNOSIS — Z452 Encounter for adjustment and management of vascular access device: Secondary | ICD-10-CM | POA: Insufficient documentation

## 2014-12-10 DIAGNOSIS — Z79899 Other long term (current) drug therapy: Secondary | ICD-10-CM

## 2014-12-10 DIAGNOSIS — D571 Sickle-cell disease without crisis: Secondary | ICD-10-CM | POA: Diagnosis present

## 2014-12-10 DIAGNOSIS — Z8041 Family history of malignant neoplasm of ovary: Secondary | ICD-10-CM

## 2014-12-10 DIAGNOSIS — G959 Disease of spinal cord, unspecified: Secondary | ICD-10-CM

## 2014-12-10 DIAGNOSIS — Z419 Encounter for procedure for purposes other than remedying health state, unspecified: Secondary | ICD-10-CM

## 2014-12-10 DIAGNOSIS — G8250 Quadriplegia, unspecified: Secondary | ICD-10-CM | POA: Diagnosis present

## 2014-12-10 DIAGNOSIS — I10 Essential (primary) hypertension: Secondary | ICD-10-CM | POA: Diagnosis present

## 2014-12-10 DIAGNOSIS — C720 Malignant neoplasm of spinal cord: Principal | ICD-10-CM | POA: Diagnosis present

## 2014-12-10 LAB — CBC WITH DIFFERENTIAL/PLATELET
Basophils Absolute: 0 10*3/uL (ref 0.0–0.1)
Basophils Relative: 0 % (ref 0–1)
Eosinophils Absolute: 0.2 10*3/uL (ref 0.0–0.7)
Eosinophils Relative: 2 % (ref 0–5)
HCT: 31.8 % — ABNORMAL LOW (ref 36.0–46.0)
Hemoglobin: 10.2 g/dL — ABNORMAL LOW (ref 12.0–15.0)
Lymphocytes Relative: 42 % (ref 12–46)
Lymphs Abs: 4.9 10*3/uL — ABNORMAL HIGH (ref 0.7–4.0)
MCH: 22.1 pg — ABNORMAL LOW (ref 26.0–34.0)
MCHC: 32.1 g/dL (ref 30.0–36.0)
MCV: 69 fL — ABNORMAL LOW (ref 78.0–100.0)
Monocytes Absolute: 0.6 10*3/uL (ref 0.1–1.0)
Monocytes Relative: 5 % (ref 3–12)
Neutro Abs: 5.9 10*3/uL (ref 1.7–7.7)
Neutrophils Relative %: 51 % (ref 43–77)
Platelets: 344 10*3/uL (ref 150–400)
RBC: 4.61 MIL/uL (ref 3.87–5.11)
RDW: 16.6 % — ABNORMAL HIGH (ref 11.5–15.5)
WBC: 11.6 10*3/uL — ABNORMAL HIGH (ref 4.0–10.5)

## 2014-12-10 LAB — URINALYSIS, ROUTINE W REFLEX MICROSCOPIC
Bilirubin Urine: NEGATIVE
Glucose, UA: 100 mg/dL — AB
Ketones, ur: NEGATIVE mg/dL
Leukocytes, UA: NEGATIVE
Nitrite: POSITIVE — AB
Protein, ur: 100 mg/dL — AB
Specific Gravity, Urine: 1.023 (ref 1.005–1.030)
Urobilinogen, UA: 1 mg/dL (ref 0.0–1.0)
pH: 6 (ref 5.0–8.0)

## 2014-12-10 LAB — COMPREHENSIVE METABOLIC PANEL
ALT: 14 U/L (ref 0–35)
AST: 17 U/L (ref 0–37)
Albumin: 3.1 g/dL — ABNORMAL LOW (ref 3.5–5.2)
Alkaline Phosphatase: 90 U/L (ref 39–117)
Anion gap: 12 (ref 5–15)
BUN: 16 mg/dL (ref 6–23)
CO2: 19 mmol/L (ref 19–32)
Calcium: 8.9 mg/dL (ref 8.4–10.5)
Chloride: 105 mEq/L (ref 96–112)
Creatinine, Ser: 0.82 mg/dL (ref 0.50–1.10)
GFR calc Af Amer: >90 mL/min (ref >90)
GFR calc non Af Amer: >90 mL/min (ref >90)
Glucose, Bld: 149 mg/dL — ABNORMAL HIGH (ref 70–99)
Potassium: 3.7 mmol/L (ref 3.5–5.1)
Sodium: 136 mmol/L (ref 135–145)
Total Bilirubin: 0.3 mg/dL (ref 0.3–1.2)
Total Protein: 7.3 g/dL (ref 6.0–8.3)

## 2014-12-10 LAB — URINE MICROSCOPIC-ADD ON

## 2014-12-10 LAB — I-STAT TROPONIN, ED: Troponin i, poc: 0 ng/mL (ref 0.00–0.08)

## 2014-12-10 MED ORDER — HYDROMORPHONE HCL 1 MG/ML IJ SOLN
2.0000 mg | Freq: Once | INTRAMUSCULAR | Status: AC
  Administered 2014-12-10: 2 mg via INTRAVENOUS
  Filled 2014-12-10: qty 2

## 2014-12-10 MED ORDER — ONDANSETRON HCL 4 MG/2ML IJ SOLN
4.0000 mg | Freq: Once | INTRAMUSCULAR | Status: AC
  Administered 2014-12-10: 4 mg via INTRAVENOUS
  Filled 2014-12-10: qty 2

## 2014-12-10 MED ORDER — SODIUM CHLORIDE 0.9 % IV BOLUS (SEPSIS)
1000.0000 mL | Freq: Once | INTRAVENOUS | Status: AC
Start: 2014-12-10 — End: 2014-12-10
  Administered 2014-12-10: 1000 mL via INTRAVENOUS

## 2014-12-10 NOTE — ED Provider Notes (Signed)
CSN: [id number]     Arrival date & time 12/10/14  2137 History   First MD Initiated Contact with Patient 12/10/14 2157     Chief Complaint  Patient presents with  . Neck Pain  . Back Pain   HPI  Patient is a 34 year old female who presents emergency room for evaluation of neck pain and back pain. Patient was here by her neurologist at [facility] neurology. Patient states she has been having progressively worsening neck pain and back pain for the past 2 years. She is recently sought treatment for her back pain and neck pain and was sent for an MRI by her neurologist at [facility] neurology. 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 Patient reports that she has had worsening balance for the last 6 months. She is also reporting bilateral weakness in the upper extremities. She also reports bilateral leg numbness and difficulty with ambulating. She states that she has lost her bowel and bladder twice in the last 3 days. She also complains of some jaw pain. She states that she feels grinding with neck movement. She has been taking Percocet and Flexeril with little to no relief. Patient ALSO complains of chest pain and shortness of breath. Patient states that she's been vomiting for the past 3 days. Chest complaints of mild abdominal pain. She also reports urinary frequency and urgency.  Past Medical History  Diagnosis Date  . Hypertension   . Asthma   . Type II diabetes mellitus   . History of blood transfusion     "after I had one of my kids"  . Sickle cell disease   . Anemia   . Depression   . Ovarian cyst   . Renal disorder     kidney stones   Past Surgical History  Procedure Laterality Date  . Cesarean section  2000; 2007; 2011  . Dilation and curettage of uterus    . Appendectomy  2013  . Reduction mammaplasty Bilateral 1998   Family History  Problem Relation Age of Onset  . Breast cancer Maternal Aunt   . Ovarian cancer  Maternal Grandmother   . Breast cancer Maternal Aunt   . Migraines Neg Hx    History  Substance Use Topics  . Smoking status: Never Smoker   . Smokeless tobacco: Never Used     Comment: 03/27/2014 "smoked ~ 1 cigarette/day; quit in ~ 2013"  . Alcohol Use: No   OB History    Gravida Para Term Preterm AB TAB SAB Ectopic Multiple Living   1              Review of Systems  Constitutional: Negative for fever, chills and fatigue.  Respiratory: Positive for shortness of breath. Negative for chest tightness and wheezing.   Cardiovascular: Positive for chest pain. Negative for palpitations and leg swelling.  Gastrointestinal: Positive for nausea, vomiting and abdominal pain. Negative for diarrhea and constipation.  Genitourinary: Positive for dysuria, urgency and frequency.  Musculoskeletal: Positive for back pain and neck pain.  All other systems reviewed and are negative.     Allergies  Amoxicillin  Home Medications   Prior to Admission medications   Medication Sig Start Date End Date Taking? Authorizing Provider  hydrOXYzine (VISTARIL) 25 MG capsule Take 25 mg by mouth at bedtime.    Historical Provider, MD  insulin aspart (NOVOLOG) 100 UNIT/ML injection Inject 20 Units into the skin 3 (three) times daily.     Historical  Provider, MD  insulin glargine (LANTUS) 100 UNIT/ML injection Inject 50 Units into the skin 2 (two) times daily.     Historical Provider, MD  losartan (COZAAR) 25 MG tablet Take 25 mg by mouth daily.    Historical Provider, MD  metFORMIN (GLUCOPHAGE) 1000 MG tablet Take 1,000 mg by mouth 2 (two) times daily.    Historical Provider, MD  metoprolol tartrate (LOPRESSOR) 25 MG tablet Take 25 mg by mouth daily.    Historical Provider, MD  topiramate (TOPAMAX) 25 MG tablet Take 2 tablets (50 mg total) by mouth 2 (two) times daily. 11/19/14   Melvenia Beam, MD   BP 139/78 mmHg  Pulse 110  Temp(Src) 97.6 F (36.4 C) (Oral)  Resp 16  Ht 5\' 3"  (1.6 m)  Wt 320 lb  (145.151 kg)  BMI 56.70 kg/m2  SpO2 100%  LMP 11/18/2014 Physical Exam  Constitutional: She is oriented to person, place, and time. She appears well-developed and well-nourished. No distress.  HENT:  Head: Normocephalic and atraumatic.  Mouth/Throat: Oropharynx is clear and moist. No oropharyngeal exudate.  Eyes: Conjunctivae and EOM are normal. Pupils are equal, round, and reactive to light. No scleral icterus.  Neck: Normal range of motion. Neck supple. No JVD present. No thyromegaly present.  Cardiovascular: Normal rate, regular rhythm, normal heart sounds and intact distal pulses.  Exam reveals no gallop and no friction rub.   No murmur heard. Pulmonary/Chest: Effort normal and breath sounds normal. No respiratory distress. She has no wheezes. She has no rales. She exhibits no tenderness.  Abdominal: Soft. Bowel sounds are normal. She exhibits no distension and no mass. There is tenderness. There is no rebound and no guarding.  Musculoskeletal: Normal range of motion.  Lymphadenopathy:    She has no cervical adenopathy.  Neurological: She is alert and oriented to person, place, and time. She has normal strength. A sensory deficit is present. No cranial nerve deficit.  Patient walks with a mildly unsteady gait. She has sensory deficits bilateral lower leg with right greater than left. Patient has 4 out of 5 grip strength bilaterally. Patient has 4-5 strength with plantar flexion and dorsiflexion of bilateral feet.  Skin: Skin is warm and dry. She is not diaphoretic.  Psychiatric: She has a normal mood and affect. Her behavior is normal. Judgment and thought content normal.  Nursing note and vitals reviewed.   ED Course  Procedures (including critical care time) Labs Review Labs Reviewed  CBC WITH DIFFERENTIAL - Abnormal; Notable for the following:    WBC 11.6 (*)    Hemoglobin 10.2 (*)    HCT 31.8 (*)    MCV 69.0 (*)    MCH 22.1 (*)    RDW 16.6 (*)    Lymphs Abs 4.9 (*)    All  other components within normal limits  COMPREHENSIVE METABOLIC PANEL - Abnormal; Notable for the following:    Glucose, Bld 149 (*)    Albumin 3.1 (*)    All other components within normal limits  URINALYSIS, ROUTINE W REFLEX MICROSCOPIC - Abnormal; Notable for the following:    Glucose, UA 100 (*)    Hgb urine dipstick SMALL (*)    Protein, ur 100 (*)    Nitrite POSITIVE (*)    All other components within normal limits  URINE MICROSCOPIC-ADD ON - Abnormal; Notable for the following:    Squamous Epithelial / LPF FEW (*)    Bacteria, UA MANY (*)    All other components within normal  limits  Randolm Idol, ED    Imaging Review Dg Chest 2 View  12/10/2014   CLINICAL DATA:  Acute onset of shortness of breath, centralized chest pain and vomiting. Neck and back pain. Initial encounter.  EXAM: CHEST  2 VIEW  COMPARISON:  Chest radiograph performed 03/27/2014  FINDINGS: The lungs are well-aerated and clear. There is no evidence of focal opacification, pleural effusion or pneumothorax.  The heart is normal in size; the mediastinal contour is within normal limits. No acute osseous abnormalities are seen.  IMPRESSION: No acute cardiopulmonary process seen.   Electronically Signed   By: Garald Balding M.D.   On: 12/10/2014 23:35     EKG Interpretation None      MDM   Final diagnoses:  Pain  UTI (lower urinary tract infection)  Weakness   Patient is a 34 year old female who presents emergency room for evaluation of neck pain and back pain. Physical exam reveals focal upper extremity weakness and bilateral lower extremity sensation deficits. 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 CBC reveals mild leukocytosis. CMP reveals mild hyper glycemia. Urinalysis is nitrite positive for leukocyte negative. May possibly be urinary tract infection. I-STAT troponin is negative. Chest x-ray reveals no acute  abnormalities. I spoken with Dr. Trenton Gammon from neurosurgery who came to see the patient. He will admit the patient to his service. Patient was discussed with Dr. Darl Householder. Patient to be admitted to neurosurgery service.    Cherylann Parr, PA-C 12/11/14 0006  Wandra Arthurs, MD 12/12/14 [id number]

## 2014-12-10 NOTE — ED Notes (Signed)
Pt. received a call today from her PCP regarding abnormal results of MRI done 12/05/2014 , reports pain at back of neck and low back pain for 2 weeks , denies injury or fall. No fever or chills.

## 2014-12-10 NOTE — ED Notes (Signed)
NeuroSurMD at bedside.

## 2014-12-10 NOTE — ED Notes (Signed)
This RN spoke with Dr. 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

## 2014-12-10 NOTE — Telephone Encounter (Signed)
Spoke to patient to discuss MRI of the cervical cord results. 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 Concerning for neoplastic process. Discussed ordering a repeat MRI of the cervical spine w/wo contrast to further examine. Patient informed me that she was in severe, 9/10 neck pain. Given she is in severe pain with the MRI findings, recommended not waiting for outpatient MRI and proceeding to the [facility] ED. She can be evaluated there for her severe pain and possibly repeat MRI of the cervical spine w/wo contrast in the ED to expedite. Spoke to Dr. Darl Householder at the ED to inform him of patient coming to the ED and recommendations for repeat MRI of the cervical spine w/wo contrast as clinically warranted; am very appreciative to Dr. Darl Householder.

## 2014-12-10 NOTE — ED Notes (Signed)
Patient transported to X-ray 

## 2014-12-11 ENCOUNTER — Inpatient Hospital Stay (HOSPITAL_COMMUNITY): Payer: Medicaid Other

## 2014-12-11 ENCOUNTER — Encounter (HOSPITAL_COMMUNITY): Payer: Self-pay | Admitting: *Deleted

## 2014-12-11 DIAGNOSIS — K567 Ileus, unspecified: Secondary | ICD-10-CM | POA: Diagnosis not present

## 2014-12-11 DIAGNOSIS — D571 Sickle-cell disease without crisis: Secondary | ICD-10-CM | POA: Diagnosis present

## 2014-12-11 DIAGNOSIS — E871 Hypo-osmolality and hyponatremia: Secondary | ICD-10-CM | POA: Diagnosis not present

## 2014-12-11 DIAGNOSIS — R000 Tachycardia, unspecified: Secondary | ICD-10-CM | POA: Diagnosis not present

## 2014-12-11 DIAGNOSIS — R609 Edema, unspecified: Secondary | ICD-10-CM | POA: Diagnosis not present

## 2014-12-11 DIAGNOSIS — Z79899 Other long term (current) drug therapy: Secondary | ICD-10-CM | POA: Diagnosis not present

## 2014-12-11 DIAGNOSIS — M542 Cervicalgia: Secondary | ICD-10-CM | POA: Diagnosis present

## 2014-12-11 DIAGNOSIS — Y838 Other surgical procedures as the cause of abnormal reaction of the patient, or of later complication, without mention of misadventure at the time of the procedure: Secondary | ICD-10-CM | POA: Diagnosis not present

## 2014-12-11 DIAGNOSIS — I10 Essential (primary) hypertension: Secondary | ICD-10-CM | POA: Diagnosis present

## 2014-12-11 DIAGNOSIS — G8250 Quadriplegia, unspecified: Secondary | ICD-10-CM | POA: Diagnosis not present

## 2014-12-11 DIAGNOSIS — K913 Postprocedural intestinal obstruction: Secondary | ICD-10-CM | POA: Diagnosis not present

## 2014-12-11 DIAGNOSIS — Z6841 Body Mass Index (BMI) 40.0 and over, adult: Secondary | ICD-10-CM | POA: Diagnosis not present

## 2014-12-11 DIAGNOSIS — E119 Type 2 diabetes mellitus without complications: Secondary | ICD-10-CM | POA: Diagnosis present

## 2014-12-11 DIAGNOSIS — Z8041 Family history of malignant neoplasm of ovary: Secondary | ICD-10-CM | POA: Diagnosis not present

## 2014-12-11 DIAGNOSIS — C720 Malignant neoplasm of spinal cord: Secondary | ICD-10-CM | POA: Diagnosis not present

## 2014-12-11 DIAGNOSIS — Z794 Long term (current) use of insulin: Secondary | ICD-10-CM | POA: Diagnosis not present

## 2014-12-11 DIAGNOSIS — D497 Neoplasm of unspecified behavior of endocrine glands and other parts of nervous system: Secondary | ICD-10-CM | POA: Diagnosis present

## 2014-12-11 DIAGNOSIS — Z803 Family history of malignant neoplasm of breast: Secondary | ICD-10-CM | POA: Diagnosis not present

## 2014-12-11 LAB — COMPREHENSIVE METABOLIC PANEL
ALT: 13 U/L (ref 0–35)
AST: 16 U/L (ref 0–37)
Albumin: 2.8 g/dL — ABNORMAL LOW (ref 3.5–5.2)
Alkaline Phosphatase: 84 U/L (ref 39–117)
Anion gap: 6 (ref 5–15)
BUN: 15 mg/dL (ref 6–23)
CO2: 27 mmol/L (ref 19–32)
Calcium: 8.7 mg/dL (ref 8.4–10.5)
Chloride: 104 mEq/L (ref 96–112)
Creatinine, Ser: 0.73 mg/dL (ref 0.50–1.10)
GFR calc Af Amer: >90 mL/min (ref >90)
GFR calc non Af Amer: >90 mL/min (ref >90)
Glucose, Bld: 116 mg/dL — ABNORMAL HIGH (ref 70–99)
Potassium: 3.8 mmol/L (ref 3.5–5.1)
Sodium: 137 mmol/L (ref 135–145)
Total Bilirubin: 0.3 mg/dL (ref 0.3–1.2)
Total Protein: 7.1 g/dL (ref 6.0–8.3)

## 2014-12-11 LAB — GLUCOSE, CAPILLARY
Glucose-Capillary: 118 mg/dL — ABNORMAL HIGH (ref 70–99)
Glucose-Capillary: 283 mg/dL — ABNORMAL HIGH (ref 70–99)
Glucose-Capillary: 305 mg/dL — ABNORMAL HIGH (ref 70–99)
Glucose-Capillary: 220 mg/dL — ABNORMAL HIGH (ref 70–99)
Glucose-Capillary: 159 mg/dL — ABNORMAL HIGH (ref 70–99)

## 2014-12-11 MED ORDER — METOPROLOL TARTRATE 25 MG PO TABS
25.0000 mg | ORAL_TABLET | Freq: Every day | ORAL | Status: DC
  Administered 2014-12-11 – 2014-12-16 (×5): 25 mg via ORAL
  Filled 2014-12-11 (×6): qty 1

## 2014-12-11 MED ORDER — ONDANSETRON HCL 4 MG PO TABS
4.0000 mg | ORAL_TABLET | Freq: Four times a day (QID) | ORAL | Status: DC | PRN
  Administered 2014-12-22: 4 mg via ORAL
  Filled 2014-12-11 (×2): qty 1

## 2014-12-11 MED ORDER — SODIUM CHLORIDE 0.9 % IV SOLN: 250.0000 mL | INTRAVENOUS | Status: DC | PRN

## 2014-12-11 MED ORDER — INSULIN ASPART 100 UNIT/ML ~~LOC~~ SOLN
20.0000 [IU] | Freq: Three times a day (TID) | SUBCUTANEOUS | Status: DC
  Administered 2014-12-11 – 2014-12-16 (×14): 20 [IU] via SUBCUTANEOUS

## 2014-12-11 MED ORDER — TOPIRAMATE 25 MG PO TABS
50.0000 mg | ORAL_TABLET | Freq: Two times a day (BID) | ORAL | Status: DC
  Administered 2014-12-11 – 2014-12-16 (×12): 50 mg via ORAL
  Filled 2014-12-11 (×14): qty 2

## 2014-12-11 MED ORDER — DIPHENHYDRAMINE HCL 50 MG/ML IJ SOLN
12.5000 mg | Freq: Four times a day (QID) | INTRAMUSCULAR | Status: DC | PRN
  Administered 2014-12-11: 12.5 mg via INTRAVENOUS
  Filled 2014-12-11: qty 1

## 2014-12-11 MED ORDER — LOSARTAN POTASSIUM 25 MG PO TABS
25.0000 mg | ORAL_TABLET | Freq: Every day | ORAL | Status: DC
  Administered 2014-12-11 – 2014-12-16 (×5): 25 mg via ORAL
  Filled 2014-12-11 (×9): qty 1

## 2014-12-11 MED ORDER — ONDANSETRON HCL 4 MG/2ML IJ SOLN
4.0000 mg | Freq: Four times a day (QID) | INTRAMUSCULAR | Status: DC | PRN
  Administered 2014-12-11 – 2014-12-24 (×7): 4 mg via INTRAVENOUS
  Filled 2014-12-11 (×7): qty 2

## 2014-12-11 MED ORDER — DEXAMETHASONE SODIUM PHOSPHATE 4 MG/ML IJ SOLN
4.0000 mg | Freq: Four times a day (QID) | INTRAMUSCULAR | Status: DC
  Administered 2014-12-11 – 2014-12-19 (×32): 4 mg via INTRAVENOUS
  Filled 2014-12-11 (×40): qty 1

## 2014-12-11 MED ORDER — ACETAMINOPHEN 325 MG PO TABS: 650.0000 mg | ORAL_TABLET | Freq: Four times a day (QID) | ORAL | Status: DC | PRN

## 2014-12-11 MED ORDER — DIPHENHYDRAMINE HCL 12.5 MG/5ML PO ELIX
12.5000 mg | ORAL_SOLUTION | Freq: Four times a day (QID) | ORAL | Status: DC | PRN
  Administered 2014-12-11: 12.5 mg via ORAL
  Filled 2014-12-11: qty 5
  Filled 2014-12-11: qty 10

## 2014-12-11 MED ORDER — SODIUM CHLORIDE 0.9 % IJ SOLN: 3.0000 mL | INTRAMUSCULAR | Status: DC | PRN

## 2014-12-11 MED ORDER — HYDROMORPHONE HCL 1 MG/ML IJ SOLN
0.5000 mg | INTRAMUSCULAR | Status: DC | PRN
  Administered 2014-12-11 – 2014-12-22 (×41): 0.5 mg via INTRAVENOUS
  Filled 2014-12-11 (×42): qty 1

## 2014-12-11 MED ORDER — OXYCODONE HCL 5 MG PO TABS
15.0000 mg | ORAL_TABLET | ORAL | Status: DC | PRN
  Administered 2014-12-11 – 2014-12-17 (×23): 15 mg via ORAL
  Filled 2014-12-11 (×24): qty 3

## 2014-12-11 MED ORDER — METFORMIN HCL 500 MG PO TABS: 1000.0000 mg | ORAL_TABLET | Freq: Two times a day (BID) | ORAL | Status: DC

## 2014-12-11 MED ORDER — METFORMIN HCL 500 MG PO TABS
1000.0000 mg | ORAL_TABLET | Freq: Two times a day (BID) | ORAL | Status: DC
  Administered 2014-12-11 – 2014-12-16 (×10): 1000 mg via ORAL
  Filled 2014-12-11 (×14): qty 2

## 2014-12-11 MED ORDER — INSULIN ASPART 100 UNIT/ML ~~LOC~~ SOLN
0.0000 [IU] | Freq: Three times a day (TID) | SUBCUTANEOUS | Status: DC
  Administered 2014-12-11: 7 [IU] via SUBCUTANEOUS
  Administered 2014-12-11: 15 [IU] via SUBCUTANEOUS
  Administered 2014-12-11: 11 [IU] via SUBCUTANEOUS
  Administered 2014-12-12 (×2): 4 [IU] via SUBCUTANEOUS
  Administered 2014-12-12: 7 [IU] via SUBCUTANEOUS
  Administered 2014-12-14 – 2014-12-15 (×2): 3 [IU] via SUBCUTANEOUS
  Administered 2014-12-15: 4 [IU] via SUBCUTANEOUS
  Administered 2014-12-16: 11 [IU] via SUBCUTANEOUS
  Administered 2014-12-16: 3 [IU] via SUBCUTANEOUS
  Administered 2014-12-16 – 2014-12-17 (×2): 7 [IU] via SUBCUTANEOUS

## 2014-12-11 MED ORDER — INSULIN ASPART 100 UNIT/ML ~~LOC~~ SOLN: 20.0000 [IU] | Freq: Three times a day (TID) | SUBCUTANEOUS | Status: DC

## 2014-12-11 MED ORDER — INSULIN GLARGINE 100 UNIT/ML ~~LOC~~ SOLN
50.0000 [IU] | Freq: Two times a day (BID) | SUBCUTANEOUS | Status: DC
  Administered 2014-12-11 – 2014-12-16 (×8): 50 [IU] via SUBCUTANEOUS
  Filled 2014-12-11 (×15): qty 0.5

## 2014-12-11 MED ORDER — PANTOPRAZOLE SODIUM 40 MG PO TBEC
40.0000 mg | DELAYED_RELEASE_TABLET | Freq: Every day | ORAL | Status: DC
  Administered 2014-12-11 – 2014-12-16 (×5): 40 mg via ORAL
  Filled 2014-12-11 (×5): qty 1

## 2014-12-11 MED ORDER — SENNA 8.6 MG PO TABS
1.0000 | ORAL_TABLET | Freq: Two times a day (BID) | ORAL | Status: DC
  Administered 2014-12-11 – 2014-12-16 (×10): 8.6 mg via ORAL
  Filled 2014-12-11 (×13): qty 1

## 2014-12-11 MED ORDER — ACETAMINOPHEN 650 MG RE SUPP: 650.0000 mg | Freq: Four times a day (QID) | RECTAL | Status: DC | PRN

## 2014-12-11 MED ORDER — OXYCODONE HCL 5 MG PO TABS
5.0000 mg | ORAL_TABLET | ORAL | Status: DC | PRN
  Administered 2014-12-16: 5 mg via ORAL
  Filled 2014-12-11: qty 1

## 2014-12-11 MED ORDER — GADOBENATE DIMEGLUMINE 529 MG/ML IV SOLN
20.0000 mL | Freq: Once | INTRAVENOUS | Status: AC | PRN
  Administered 2014-12-11: 20 mL via INTRAVENOUS

## 2014-12-11 MED ORDER — HYDROXYZINE PAMOATE 25 MG PO CAPS: 25.0000 mg | ORAL_CAPSULE | Freq: Every day | ORAL | Status: DC

## 2014-12-11 MED ORDER — SODIUM CHLORIDE 0.9 % IJ SOLN
3.0000 mL | Freq: Two times a day (BID) | INTRAMUSCULAR | Status: DC
  Administered 2014-12-11 – 2014-12-16 (×10): 3 mL via INTRAVENOUS

## 2014-12-11 MED ORDER — HYDROXYZINE HCL 25 MG PO TABS
25.0000 mg | ORAL_TABLET | Freq: Every day | ORAL | Status: DC
  Administered 2014-12-11 – 2014-12-16 (×6): 25 mg via ORAL
  Filled 2014-12-11 (×7): qty 1

## 2014-12-11 NOTE — H&P (Signed)
Cynthia Rosales is an 34 y.o. female.   Chief Complaint: Neck pain HPI: 34 year old female with a two-year history of progressive neck pain with radiation into both upper extremities with progressive numbness weakness and recent loss of bowel and bladder function. 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 Lesion appears to be cystic with expansion of the cord. Patient has not a received IV contrasted scan.  Past Medical History  Diagnosis Date  . Hypertension   . Asthma   . Type II diabetes mellitus   . History of blood transfusion     "after I had one of my kids"  . Sickle cell disease   . Anemia   . Depression   . Ovarian cyst   . Renal disorder     kidney stones    Past Surgical History  Procedure Laterality Date  . Cesarean section  2000; 2007; 2011  . Dilation and curettage of uterus    . Appendectomy  2013  . Reduction mammaplasty Bilateral 1998    Family History  Problem Relation Age of Onset  . Breast cancer Maternal Aunt   . Ovarian cancer Maternal Grandmother   . Breast cancer Maternal Aunt   . Migraines Neg Hx    Social History:  reports that she has never smoked. She has never used smokeless tobacco. She reports that she does not drink alcohol or use illicit drugs.  Allergies:  Allergies  Allergen Reactions  . Amoxicillin Hives and Rash     (Not in a hospital admission)  Results for orders placed or performed during the hospital encounter of 12/10/14 (from the past 48 hour(s))  CBC with Differential     Status: Abnormal   Collection Time: 12/10/14  9:48 PM  Result Value Ref Range   WBC 11.6 (H) 4.0 - 10.5 K/uL   RBC 4.61 3.87 - 5.11 MIL/uL   Hemoglobin 10.2 (L) 12.0 - 15.0 g/dL   HCT 31.8 (L) 36.0 - 46.0 %   MCV 69.0 (L) 78.0 - 100.0 fL   MCH 22.1 (L) 26.0 - 34.0 pg   MCHC 32.1 30.0 - 36.0 g/dL   RDW 16.6 (H) 11.5 - 15.5 %   Platelets 344 150 - 400 K/uL   Neutrophils Relative % 51 43 - 77 %   Lymphocytes Relative  42 12 - 46 %   Monocytes Relative 5 3 - 12 %   Eosinophils Relative 2 0 - 5 %   Basophils Relative 0 0 - 1 %   Neutro Abs 5.9 1.7 - 7.7 K/uL   Lymphs Abs 4.9 (H) 0.7 - 4.0 K/uL   Monocytes Absolute 0.6 0.1 - 1.0 K/uL   Eosinophils Absolute 0.2 0.0 - 0.7 K/uL   Basophils Absolute 0.0 0.0 - 0.1 K/uL  Comprehensive metabolic panel     Status: Abnormal   Collection Time: 12/10/14  9:48 PM  Result Value Ref Range   Sodium 136 135 - 145 mmol/L    Comment: Please note change in reference range.   Potassium 3.7 3.5 - 5.1 mmol/L    Comment: Please note change in reference range.   Chloride 105 96 - 112 mEq/L   CO2 19 19 - 32 mmol/L   Glucose, Bld 149 (H) 70 - 99 mg/dL   BUN 16 6 - 23 mg/dL   Creatinine, Ser 0.82 0.50 - 1.10 mg/dL   Calcium 8.9 8.4 - 10.5 mg/dL   Total Protein 7.3 6.0 - 8.3 g/dL  Albumin 3.1 (L) 3.5 - 5.2 g/dL   AST 17 0 - 37 U/L   ALT 14 0 - 35 U/L   Alkaline Phosphatase 90 39 - 117 U/L   Total Bilirubin 0.3 0.3 - 1.2 mg/dL   GFR calc non Af Amer >90 >90 mL/min   GFR calc Af Amer >90 >90 mL/min    Comment: (NOTE) The eGFR has been calculated using the CKD EPI equation. This calculation has not been validated in all clinical situations. eGFR's persistently <90 mL/min signify possible Chronic Kidney Disease.    Anion gap 12 5 - 15  I-stat troponin, ED     Status: None   Collection Time: 12/10/14 10:55 PM  Result Value Ref Range   Troponin i, poc 0.00 0.00 - 0.08 ng/mL   Comment 3            Comment: Due to the release kinetics of cTnI, a negative result within the first hours of the onset of symptoms does not rule out myocardial infarction with certainty. If myocardial infarction is still suspected, repeat the test at appropriate intervals.   Urinalysis, Routine w reflex microscopic     Status: Abnormal   Collection Time: 12/10/14 11:10 PM  Result Value Ref Range   Color, Urine YELLOW YELLOW   APPearance CLEAR CLEAR   Specific Gravity, Urine 1.023 1.005 -  1.030   pH 6.0 5.0 - 8.0   Glucose, UA 100 (A) NEGATIVE mg/dL   Hgb urine dipstick SMALL (A) NEGATIVE   Bilirubin Urine NEGATIVE NEGATIVE   Ketones, ur NEGATIVE NEGATIVE mg/dL   Protein, ur 960 (A) NEGATIVE mg/dL   Urobilinogen, UA 1.0 0.0 - 1.0 mg/dL   Nitrite POSITIVE (A) NEGATIVE   Leukocytes, UA NEGATIVE NEGATIVE  Urine microscopic-add on     Status: Abnormal   Collection Time: 12/10/14 11:10 PM  Result Value Ref Range   Squamous Epithelial / LPF FEW (A) RARE   WBC, UA 0-2 <3 WBC/hpf   RBC / HPF 0-2 <3 RBC/hpf   Bacteria, UA MANY (A) RARE   Dg Chest 2 View  12/10/2014   CLINICAL DATA:  Acute onset of shortness of breath, centralized chest pain and vomiting. Neck and back pain. Initial encounter.  EXAM: CHEST  2 VIEW  COMPARISON:  Chest radiograph performed 03/27/2014  FINDINGS: The lungs are well-aerated and clear. There is no evidence of focal opacification, pleural effusion or pneumothorax.  The heart is normal in size; the mediastinal contour is within normal limits. No acute osseous abnormalities are seen.  IMPRESSION: No acute cardiopulmonary process seen.   Electronically Signed   By: Roanna Raider M.D.   On: 12/10/2014 23:35    Pertinent items are noted in HPI.  Blood pressure 139/78, pulse 110, temperature 97.6 F (36.4 C), temperature source Oral, resp. rate 16, height 5\' 3"  (1.6 m), weight 145.151 kg (320 lb), last menstrual period 11/18/2014, SpO2 100 %, unknown if currently breastfeeding.  The patient is awake and alert. She is oriented and appropriate. She is obviously uncomfortable. Examination of her cranial nerve function is intact bilaterally. Examination of her motor function reveals slight weakness of her deltoid muscle group right greater than left grating out at 4 over 5. She has 4 over 5 weakness of bilateral biceps, triceps, wrist extensors, grips and intrinsics. She has 4 over 5 spastic weakness of both lower extremities right again somewhat greater than left  area and she has a relative sensory level at C5. Examination head ears  eyes and throat is unremarkable. Chest and abdomen are benign aside from morbid obesity. Extremities are free from injury or deformity. 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 Patient needs hospital admission for further workup. Begin IV steroids. Begin Foley catheter placement and obtained MRI scan of cervical spine with and without contrast.  Vida Nicol A 12/11/2014, 12:12 AM

## 2014-12-11 NOTE — ED Notes (Signed)
Patient transported to MRI 

## 2014-12-12 LAB — GLUCOSE, CAPILLARY
Glucose-Capillary: 192 mg/dL — ABNORMAL HIGH (ref 70–99)
Glucose-Capillary: 219 mg/dL — ABNORMAL HIGH (ref 70–99)
Glucose-Capillary: 163 mg/dL — ABNORMAL HIGH (ref 70–99)
Glucose-Capillary: 150 mg/dL — ABNORMAL HIGH (ref 70–99)

## 2014-12-12 NOTE — Progress Notes (Signed)
CARE MANAGEMENT NOTE 12/12/2014  Patient:  Cynthia Rosales, Cynthia Rosales   Account Number:  [id number]  Date Initiated:  12/12/2014  Documentation initiated by:  Olga Coaster  Subjective/Objective Assessment:   ADMITTED WITH SPINAL CORD NEOPLASM - FOR SURGERY TOMORROW 12/13/2014     Action/Plan:   CM FOLLOWING FOR DCP   Anticipated DC Date:  12/19/2014   Anticipated DC Plan:  TBD AFTER SURGERY    DC Planning Services  CM consult       Status of service:  In process, will continue to follow Medicare Important Message given?   (If response is "NO", the following Medicare IM given date fields will be blank)  Per UR Regulation:  Reviewed for med. necessity/level of care/duration of stay  Comments:  1/13/2016Mindi Slicker RN,BSN [facility]HA [phone]

## 2014-12-12 NOTE — Progress Notes (Signed)
Overall the patient is stable. Her pain, numbness and weakness have not changed much if any while on steroids for more than 24 hours. 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 Imaging certainly not classical for either at this point. Given the two-year history of her symptoms with their slow progression I strongly doubt this represents any type of demyelinating lesion. Also given its enhancement and cord expansion this argues against a demyelinating lesion. At this point I think the patient's best and only true option is surgical exploration with biopsy and/or resection via cervical laminectomy and intramedullary tumor resection. I discussed the risks and benefits involved with this surgery including but not limited to the risk of anesthesia, bleeding, infection, CSF leak, nerve injury including spinal cord injury with possible paralysis or other neurologic worsening. I also discussed the risk of non-diagnosis and the risk of tumor progression/recurrence. The patient is aware of these risks and wishes to proceed with surgery. Plan is for surgery tomorrow morning.

## 2014-12-12 NOTE — Progress Notes (Signed)
Chaplain initiated visit with pt. Pt troubled over upcoming surgery and the potential dangers of "being unable to walk." Pt is concerned about providing for her three children should something adverse happen during her surgery. Pt has familial support, but mostly in [place]. Chaplain and pt explored coping strategies for her fears and tried our a guided breath prayer. Pt appreciated chaplain visit. Chaplain will continue to follow. Chaplain informed pt of her services should she need them. Page chaplain if needed before follow up.   12/12/14 0900  Clinical Encounter Type  Visited With Patient  Visit Type Initial;Spiritual support  Consult/Referral To Chaplain  Recommendations Follow Up Post-Op  Spiritual Encounters  Spiritual Needs Emotional;Prayer  Stress Factors  Patient Stress Factors Health changes;Family relationships  Anapaola Kinsel, Epifanio Lesches 12/12/2014 9:57 AM

## 2014-12-12 NOTE — Progress Notes (Signed)
No orders to keep patient NPO but per nursing judgement and Dr. Gearldine Bienenstock 1/13 note, nurse will keep patient NPO after midnight per tentative scheduled surgery. Will report off to am nurse for further follow up with the physicians.

## 2014-12-13 ENCOUNTER — Inpatient Hospital Stay (HOSPITAL_COMMUNITY): Payer: Medicaid Other | Admitting: Certified Registered"

## 2014-12-13 ENCOUNTER — Encounter (HOSPITAL_COMMUNITY)
Admission: EM | Disposition: A | Discharge status: Rehabilitation Facility | Payer: Self-pay | Source: Home / Self Care | Attending: Neurosurgery

## 2014-12-13 ENCOUNTER — Inpatient Hospital Stay (HOSPITAL_COMMUNITY): Payer: Medicaid Other

## 2014-12-13 ENCOUNTER — Other Ambulatory Visit: Payer: Self-pay | Admitting: Neurosurgery

## 2014-12-13 HISTORY — PX: LAMINECTOMY: SHX219

## 2014-12-13 LAB — GLUCOSE, CAPILLARY
Glucose-Capillary: 139 mg/dL — ABNORMAL HIGH (ref 70–99)
Glucose-Capillary: 181 mg/dL — ABNORMAL HIGH (ref 70–99)
Glucose-Capillary: 167 mg/dL — ABNORMAL HIGH (ref 70–99)

## 2014-12-13 LAB — MRSA PCR SCREENING: MRSA by PCR: NEGATIVE

## 2014-12-13 SURGERY — CERVICAL LAMINECTOMY FOR TUMOR
Anesthesia: General | Site: Spine Cervical

## 2014-12-13 MED ORDER — HEMOSTATIC AGENTS (NO CHARGE) OPTIME
TOPICAL | Status: DC | PRN
Start: 2014-12-13 — End: 2014-12-13
  Administered 2014-12-13: 1 via TOPICAL

## 2014-12-13 MED ORDER — HYDROMORPHONE HCL 1 MG/ML IJ SOLN
0.2500 mg | INTRAMUSCULAR | Status: DC | PRN
  Administered 2014-12-13 (×4): 0.5 mg via INTRAVENOUS

## 2014-12-13 MED ORDER — ROCURONIUM BROMIDE 50 MG/5ML IV SOLN
INTRAVENOUS | Status: AC
  Filled 2014-12-13: qty 2

## 2014-12-13 MED ORDER — ROCURONIUM BROMIDE 100 MG/10ML IV SOLN
INTRAVENOUS | Status: DC | PRN
  Administered 2014-12-13: 10 mg via INTRAVENOUS
  Administered 2014-12-13: 20 mg via INTRAVENOUS
  Administered 2014-12-13: 50 mg via INTRAVENOUS
  Administered 2014-12-13: 20 mg via INTRAVENOUS

## 2014-12-13 MED ORDER — DIPHENHYDRAMINE HCL 50 MG/ML IJ SOLN
INTRAMUSCULAR | Status: AC
  Filled 2014-12-13: qty 1

## 2014-12-13 MED ORDER — SODIUM CHLORIDE 0.9 % IJ SOLN: 3.0000 mL | INTRAMUSCULAR | Status: DC | PRN

## 2014-12-13 MED ORDER — SODIUM CHLORIDE 0.9 % IV SOLN
250.0000 mL | INTRAVENOUS | Status: DC
  Administered 2014-12-14 – 2014-12-15 (×2): 250 mL via INTRAVENOUS

## 2014-12-13 MED ORDER — BISACODYL 10 MG RE SUPP
10.0000 mg | Freq: Every day | RECTAL | Status: DC | PRN
  Filled 2014-12-13: qty 1

## 2014-12-13 MED ORDER — MIDAZOLAM HCL 2 MG/2ML IJ SOLN
INTRAMUSCULAR | Status: AC
  Filled 2014-12-13: qty 2

## 2014-12-13 MED ORDER — OXYCODONE HCL 5 MG/5ML PO SOLN: 5.0000 mg | Freq: Once | ORAL | Status: DC | PRN

## 2014-12-13 MED ORDER — FENTANYL CITRATE 0.05 MG/ML IJ SOLN
INTRAMUSCULAR | Status: AC
  Filled 2014-12-13: qty 5

## 2014-12-13 MED ORDER — SODIUM CHLORIDE 0.9 % IJ SOLN
9.0000 mL | INTRAMUSCULAR | Status: DC | PRN
  Administered 2014-12-15: 9 mL via INTRAVENOUS
  Filled 2014-12-13: qty 9

## 2014-12-13 MED ORDER — CEFAZOLIN SODIUM-DEXTROSE 2-3 GM-% IV SOLR
INTRAVENOUS | Status: AC
  Filled 2014-12-13: qty 100

## 2014-12-13 MED ORDER — SUCCINYLCHOLINE CHLORIDE 20 MG/ML IJ SOLN
INTRAMUSCULAR | Status: AC
  Filled 2014-12-13: qty 1

## 2014-12-13 MED ORDER — NEOSTIGMINE METHYLSULFATE 10 MG/10ML IV SOLN
INTRAVENOUS | Status: DC | PRN
  Administered 2014-12-13: 5 mg via INTRAVENOUS

## 2014-12-13 MED ORDER — BACITRACIN ZINC 500 UNIT/GM EX OINT
TOPICAL_OINTMENT | CUTANEOUS | Status: DC | PRN
  Administered 2014-12-13: 1 via TOPICAL

## 2014-12-13 MED ORDER — FENTANYL 10 MCG/ML IV SOLN
INTRAVENOUS | Status: DC
  Administered 2014-12-13: 15 ug via INTRAVENOUS
  Administered 2014-12-13: 120 ug via INTRAVENOUS
  Administered 2014-12-14: 150 ug via INTRAVENOUS
  Administered 2014-12-14: 195 ug via INTRAVENOUS
  Administered 2014-12-14: 01:00:00 via INTRAVENOUS
  Administered 2014-12-14 (×2): 90 ug via INTRAVENOUS
  Administered 2014-12-14: 14:00:00 via INTRAVENOUS
  Administered 2014-12-14: 135 ug via INTRAVENOUS
  Administered 2014-12-14: 72.78 ug via INTRAVENOUS
  Administered 2014-12-15: 23:00:00 via INTRAVENOUS
  Administered 2014-12-15: 90 ug via INTRAVENOUS
  Administered 2014-12-15: 120 ug via INTRAVENOUS
  Administered 2014-12-15: 75 ug via INTRAVENOUS
  Filled 2014-12-13 (×6): qty 50

## 2014-12-13 MED ORDER — ARTIFICIAL TEARS OP OINT
TOPICAL_OINTMENT | OPHTHALMIC | Status: AC
  Filled 2014-12-13: qty 3.5

## 2014-12-13 MED ORDER — NALOXONE HCL 0.4 MG/ML IJ SOLN
0.4000 mg | INTRAMUSCULAR | Status: DC | PRN
  Filled 2014-12-13: qty 1

## 2014-12-13 MED ORDER — ONDANSETRON HCL 4 MG/2ML IJ SOLN
INTRAMUSCULAR | Status: DC | PRN
  Administered 2014-12-13: 4 mg via INTRAVENOUS

## 2014-12-13 MED ORDER — PROPOFOL 10 MG/ML IV BOLUS
INTRAVENOUS | Status: AC
  Filled 2014-12-13: qty 20

## 2014-12-13 MED ORDER — THROMBIN 20000 UNITS EX SOLR
CUTANEOUS | Status: DC | PRN
  Administered 2014-12-13: 09:00:00 via TOPICAL

## 2014-12-13 MED ORDER — FENTANYL CITRATE 0.05 MG/ML IJ SOLN
INTRAMUSCULAR | Status: DC | PRN
  Administered 2014-12-13: 150 ug via INTRAVENOUS
  Administered 2014-12-13 (×2): 100 ug via INTRAVENOUS

## 2014-12-13 MED ORDER — MIDAZOLAM HCL 5 MG/5ML IJ SOLN
INTRAMUSCULAR | Status: DC | PRN
  Administered 2014-12-13: 2 mg via INTRAVENOUS

## 2014-12-13 MED ORDER — PHENOL 1.4 % MT LIQD: 1.0000 | OROMUCOSAL | Status: DC | PRN

## 2014-12-13 MED ORDER — LACTATED RINGERS IV SOLN
INTRAVENOUS | Status: DC | PRN
  Administered 2014-12-13 (×3): via INTRAVENOUS

## 2014-12-13 MED ORDER — OXYCODONE HCL 5 MG PO TABS: 5.0000 mg | ORAL_TABLET | Freq: Once | ORAL | Status: DC | PRN

## 2014-12-13 MED ORDER — CEFAZOLIN SODIUM 10 G IJ SOLR
3.0000 g | INTRAMUSCULAR | Status: DC | PRN
  Administered 2014-12-13: 3 g via INTRAVENOUS

## 2014-12-13 MED ORDER — SODIUM CHLORIDE 0.9 % IJ SOLN
3.0000 mL | Freq: Two times a day (BID) | INTRAMUSCULAR | Status: DC
  Administered 2014-12-13 – 2014-12-15 (×4): 3 mL via INTRAVENOUS

## 2014-12-13 MED ORDER — DEXAMETHASONE SODIUM PHOSPHATE 10 MG/ML IJ SOLN
INTRAMUSCULAR | Status: DC | PRN
  Administered 2014-12-13: 10 mg via INTRAVENOUS

## 2014-12-13 MED ORDER — POLYETHYLENE GLYCOL 3350 17 G PO PACK
17.0000 g | PACK | Freq: Every day | ORAL | Status: DC | PRN
  Filled 2014-12-13: qty 1

## 2014-12-13 MED ORDER — NEOSTIGMINE METHYLSULFATE 10 MG/10ML IV SOLN
INTRAVENOUS | Status: AC
Start: 2014-12-13 — End: 2014-12-13
  Filled 2014-12-13: qty 1

## 2014-12-13 MED ORDER — LIDOCAINE HCL (CARDIAC) 20 MG/ML IV SOLN
INTRAVENOUS | Status: DC | PRN
  Administered 2014-12-13: 100 mg via INTRAVENOUS

## 2014-12-13 MED ORDER — ONDANSETRON HCL 4 MG/2ML IJ SOLN
4.0000 mg | Freq: Four times a day (QID) | INTRAMUSCULAR | Status: DC | PRN
  Filled 2014-12-13: qty 2

## 2014-12-13 MED ORDER — GLYCOPYRROLATE 0.2 MG/ML IJ SOLN
INTRAMUSCULAR | Status: DC | PRN
  Administered 2014-12-13: .8 mg via INTRAVENOUS

## 2014-12-13 MED ORDER — CEFAZOLIN SODIUM 10 G IJ SOLR
3.0000 g | Freq: Once | INTRAMUSCULAR | Status: DC
  Filled 2014-12-13: qty 3000

## 2014-12-13 MED ORDER — HYDROMORPHONE HCL 1 MG/ML IJ SOLN
INTRAMUSCULAR | Status: AC
  Filled 2014-12-13: qty 1

## 2014-12-13 MED ORDER — 0.9 % SODIUM CHLORIDE (POUR BTL) OPTIME
TOPICAL | Status: DC | PRN
  Administered 2014-12-13: 1000 mL

## 2014-12-13 MED ORDER — OXYCODONE-ACETAMINOPHEN 5-325 MG PO TABS
ORAL_TABLET | ORAL | Status: AC
  Filled 2014-12-13: qty 2

## 2014-12-13 MED ORDER — FLEET ENEMA 7-19 GM/118ML RE ENEM
1.0000 | ENEMA | Freq: Once | RECTAL | Status: AC | PRN
  Filled 2014-12-13: qty 1

## 2014-12-13 MED ORDER — OXYCODONE-ACETAMINOPHEN 5-325 MG PO TABS
1.0000 | ORAL_TABLET | ORAL | Status: DC | PRN
  Administered 2014-12-13 – 2014-12-16 (×6): 2 via ORAL
  Filled 2014-12-13 (×5): qty 2

## 2014-12-13 MED ORDER — GLYCOPYRROLATE 0.2 MG/ML IJ SOLN
INTRAMUSCULAR | Status: AC
  Filled 2014-12-13: qty 4

## 2014-12-13 MED ORDER — SODIUM CHLORIDE 0.9 % IR SOLN
Status: DC | PRN
  Administered 2014-12-13: 500 mL

## 2014-12-13 MED ORDER — ALUM & MAG HYDROXIDE-SIMETH 200-200-20 MG/5ML PO SUSP
30.0000 mL | Freq: Four times a day (QID) | ORAL | Status: DC | PRN
  Administered 2014-12-17: 30 mL via ORAL
  Filled 2014-12-13: qty 30

## 2014-12-13 MED ORDER — ONDANSETRON HCL 4 MG/2ML IJ SOLN
INTRAMUSCULAR | Status: AC
  Filled 2014-12-13: qty 2

## 2014-12-13 MED ORDER — MENTHOL 3 MG MT LOZG: 1.0000 | LOZENGE | OROMUCOSAL | Status: DC | PRN

## 2014-12-13 MED ORDER — CYCLOBENZAPRINE HCL 10 MG PO TABS
10.0000 mg | ORAL_TABLET | Freq: Three times a day (TID) | ORAL | Status: DC | PRN
  Administered 2014-12-14 – 2014-12-15 (×4): 10 mg via ORAL
  Filled 2014-12-13 (×5): qty 1

## 2014-12-13 MED ORDER — MICROFIBRILLAR COLL HEMOSTAT EX PADS
MEDICATED_PAD | CUTANEOUS | Status: DC | PRN
  Administered 2014-12-13: 1 via TOPICAL

## 2014-12-13 MED ORDER — LIDOCAINE HCL (CARDIAC) 20 MG/ML IV SOLN
INTRAVENOUS | Status: AC
  Filled 2014-12-13: qty 5

## 2014-12-13 MED ORDER — VECURONIUM BROMIDE 10 MG IV SOLR
INTRAVENOUS | Status: DC | PRN
  Administered 2014-12-13 (×2): 2 mg via INTRAVENOUS

## 2014-12-13 MED ORDER — LABETALOL HCL 5 MG/ML IV SOLN
INTRAVENOUS | Status: DC | PRN
  Administered 2014-12-13: 10 mg via INTRAVENOUS

## 2014-12-13 MED ORDER — PROMETHAZINE HCL 25 MG/ML IJ SOLN
6.2500 mg | INTRAMUSCULAR | Status: DC | PRN
Start: 2014-12-13 — End: 2014-12-13

## 2014-12-13 MED ORDER — VANCOMYCIN HCL 10 G IV SOLR
1500.0000 mg | Freq: Once | INTRAVENOUS | Status: AC
  Administered 2014-12-13: 1500 mg via INTRAVENOUS
  Filled 2014-12-13: qty 1500

## 2014-12-13 MED ORDER — PROPOFOL 10 MG/ML IV BOLUS
INTRAVENOUS | Status: DC | PRN
  Administered 2014-12-13: 200 mg via INTRAVENOUS

## 2014-12-13 MED ORDER — SUCCINYLCHOLINE CHLORIDE 20 MG/ML IJ SOLN
INTRAMUSCULAR | Status: DC | PRN
  Administered 2014-12-13: 120 mg via INTRAVENOUS

## 2014-12-13 SURGICAL SUPPLY — 79 items
BAG DECANTER FOR FLEXI CONT (MISCELLANEOUS) ×2 IMPLANT
BENZOIN TINCTURE PRP APPL 2/3 (GAUZE/BANDAGES/DRESSINGS) ×2 IMPLANT
BLADE CLIPPER SURG (BLADE) ×2 IMPLANT
BLADE SURG 11 STRL SS (BLADE) IMPLANT
BRUSH SCRUB EZ PLAIN DRY (MISCELLANEOUS) ×2 IMPLANT
BUR CUTTER 7.0 ROUND (BURR) ×2 IMPLANT
BUR MATCHSTICK NEURO 3.0 LAGG (BURR) ×2 IMPLANT
CANISTER SUCT 3000ML (MISCELLANEOUS) ×2 IMPLANT
CONT SPEC 4OZ CLIKSEAL STRL BL (MISCELLANEOUS) ×6 IMPLANT
DRAPE LAPAROTOMY 100X72 PEDS (DRAPES) ×2 IMPLANT
DRAPE LAPAROTOMY 100X72X124 (DRAPES) ×2 IMPLANT
DRAPE MICROSCOPE LEICA (MISCELLANEOUS) ×2 IMPLANT
DRAPE ORTHO SPLIT 77X108 STRL (DRAPES)
DRAPE POUCH INSTRU U-SHP 10X18 (DRAPES) ×2 IMPLANT
DRAPE SURG 17X23 STRL (DRAPES) IMPLANT
DRAPE SURG ORHT 6 SPLT 77X108 (DRAPES) IMPLANT
DRSG OPSITE POSTOP 4X6 (GAUZE/BANDAGES/DRESSINGS) ×2 IMPLANT
DURASEAL APPLICATOR TIP (TIP) ×2 IMPLANT
DURASEAL SPINE SEALANT 3ML (MISCELLANEOUS) ×2 IMPLANT
ELECT REM PT RETURN 9FT ADLT (ELECTROSURGICAL) ×2
ELECTRODE REM PT RTRN 9FT ADLT (ELECTROSURGICAL) ×1 IMPLANT
GAUZE SPONGE 4X4 12PLY STRL (GAUZE/BANDAGES/DRESSINGS) IMPLANT
GAUZE SPONGE 4X4 16PLY XRAY LF (GAUZE/BANDAGES/DRESSINGS) IMPLANT
GLOVE BIO SURGEON STRL SZ8 (GLOVE) ×4 IMPLANT
GLOVE BIOGEL PI IND STRL 7.0 (GLOVE) ×3 IMPLANT
GLOVE BIOGEL PI IND STRL 7.5 (GLOVE) ×1 IMPLANT
GLOVE BIOGEL PI IND STRL 8.5 (GLOVE) ×2 IMPLANT
GLOVE BIOGEL PI INDICATOR 7.0 (GLOVE) ×3
GLOVE BIOGEL PI INDICATOR 7.5 (GLOVE) ×1
GLOVE BIOGEL PI INDICATOR 8.5 (GLOVE) ×2
GLOVE ECLIPSE 6.5 STRL STRAW (GLOVE) ×6 IMPLANT
GLOVE ECLIPSE 9.0 STRL (GLOVE) ×2 IMPLANT
GLOVE EXAM NITRILE LRG STRL (GLOVE) IMPLANT
GLOVE EXAM NITRILE MD LF STRL (GLOVE) IMPLANT
GLOVE EXAM NITRILE XL STR (GLOVE) IMPLANT
GLOVE EXAM NITRILE XS STR PU (GLOVE) IMPLANT
GLOVE SURG SS PI 7.5 STRL IVOR (GLOVE) ×2 IMPLANT
GOWN STRL REUS W/ TWL LRG LVL3 (GOWN DISPOSABLE) ×3 IMPLANT
GOWN STRL REUS W/ TWL XL LVL3 (GOWN DISPOSABLE) ×2 IMPLANT
GOWN STRL REUS W/TWL 2XL LVL3 (GOWN DISPOSABLE) IMPLANT
GOWN STRL REUS W/TWL LRG LVL3 (GOWN DISPOSABLE) ×3
GOWN STRL REUS W/TWL XL LVL3 (GOWN DISPOSABLE) ×2
HEMOSTAT SURGICEL 2X14 (HEMOSTASIS) ×2 IMPLANT
KIT BASIN OR (CUSTOM PROCEDURE TRAY) ×2 IMPLANT
KIT ROOM TURNOVER OR (KITS) ×2 IMPLANT
LIQUID BAND (GAUZE/BANDAGES/DRESSINGS) ×2 IMPLANT
NEEDLE HYPO 22GX1.5 SAFETY (NEEDLE) ×2 IMPLANT
NEEDLE HYPO 25X1 1.5 SAFETY (NEEDLE) IMPLANT
NEEDLE SPNL 20GX3.5 QUINCKE YW (NEEDLE) ×2 IMPLANT
NS IRRIG 1000ML POUR BTL (IV SOLUTION) ×2 IMPLANT
PACK LAMINECTOMY NEURO (CUSTOM PROCEDURE TRAY) ×2 IMPLANT
PAD EYE OVAL STERILE LF (GAUZE/BANDAGES/DRESSINGS) IMPLANT
PATTIES SURGICAL .5 X.5 (GAUZE/BANDAGES/DRESSINGS) IMPLANT
PATTIES SURGICAL .5 X3 (DISPOSABLE) IMPLANT
RUBBERBAND STERILE (MISCELLANEOUS) ×6 IMPLANT
SET TUBING W/EXT DISP (INSTRUMENTS) IMPLANT
SPONGE LAP 4X18 X RAY DECT (DISPOSABLE) IMPLANT
SPONGE SURGIFOAM ABS GEL 100 (HEMOSTASIS) ×2 IMPLANT
STAPLER VISISTAT 35W (STAPLE) ×2 IMPLANT
STRIP CLOSURE SKIN 1/2X4 (GAUZE/BANDAGES/DRESSINGS) ×4 IMPLANT
SUT BONE WAX W31G (SUTURE) IMPLANT
SUT ETHILON 4 0 PS 2 18 (SUTURE) IMPLANT
SUT NURALON 4 0 TR CR/8 (SUTURE) ×2 IMPLANT
SUT PROLENE 5 0 C1 (SUTURE) ×2 IMPLANT
SUT PROLENE 6 0 BV (SUTURE) IMPLANT
SUT VIC AB 0 CT1 18XCR BRD8 (SUTURE) ×1 IMPLANT
SUT VIC AB 0 CT1 8-18 (SUTURE) ×1
SUT VIC AB 2-0 CT1 18 (SUTURE) ×4 IMPLANT
SUT VIC AB 3-0 SH 8-18 (SUTURE) ×2 IMPLANT
SUT VICRYL 4-0 PS2 18IN ABS (SUTURE) ×2 IMPLANT
SYR 20ML ECCENTRIC (SYRINGE) ×2 IMPLANT
TIP NONSTICK .5MMX23CM (INSTRUMENTS) ×1
TIP NONSTICK .5X23 (INSTRUMENTS) ×1 IMPLANT
TIP STRAIGHT 25KHZ (INSTRUMENTS) IMPLANT
TOWEL OR 17X24 6PK STRL BLUE (TOWEL DISPOSABLE) ×2 IMPLANT
TOWEL OR 17X26 10 PK STRL BLUE (TOWEL DISPOSABLE) ×2 IMPLANT
TRAY FOLEY CATH 14FRSI W/METER (CATHETERS) IMPLANT
TUBE CONNECTING 12X1/4 (SUCTIONS) IMPLANT
WATER STERILE IRR 1000ML POUR (IV SOLUTION) ×2 IMPLANT

## 2014-12-13 NOTE — Anesthesia Preprocedure Evaluation (Signed)
Anesthesia Evaluation  Patient identified by MRN, date of birth, ID band Patient awake    Reviewed: Allergy & Precautions, NPO status , Patient's Chart, lab work & pertinent test results  Airway        Dental   Pulmonary asthma ,          Cardiovascular hypertension, Pt. on medications and Pt. on home beta blockers     Neuro/Psych  Headaches, Depression Cervical intradural tumor    GI/Hepatic negative GI ROS, Neg liver ROS,   Endo/Other  diabetes, Poorly Controlled, Type 2, Insulin DependentMorbid obesity  Renal/GU negative Renal ROS     Musculoskeletal   Abdominal   Peds  Hematology  (+) anemia ,   Anesthesia Other Findings   Reproductive/Obstetrics                             Anesthesia Physical Anesthesia Plan  ASA: III  Anesthesia Plan: General   Post-op Pain Management:    Induction: Intravenous  Airway Management Planned: Oral ETT  Additional Equipment:   Intra-op Plan:   Post-operative Plan: Extubation in OR  Informed Consent: I have reviewed the patients History and Physical, chart, labs and discussed the procedure including the risks, benefits and alternatives for the proposed anesthesia with the patient or authorized representative who has indicated his/her understanding and acceptance.   Dental advisory given  Plan Discussed with: CRNA  Anesthesia Plan Comments:         Anesthesia Quick Evaluation

## 2014-12-13 NOTE — Brief Op Note (Signed)
12/10/2014 - 12/13/2014  11:03 AM  PATIENT:  Cynthia Rosales  34 y.o. female  PRE-OPERATIVE DIAGNOSIS:  Intradural tumor  POST-OPERATIVE DIAGNOSIS:  intradural tumor  PROCEDURE:  Procedure(s) with comments: CERVICAL LAMINECTOMY FOR INTRADURAL TUMOR (N/A) - posterior  SURGEON:  Surgeon(s) and Role:    * Charlie Pitter, MD - Primary    * Erline Levine, MD - Assisting  PHYSICIAN ASSISTANT:   ASSISTANTS:    ANESTHESIA:   general  EBL:  Total I/O In: 1000 [I.V.:1000] Out: 3833 [Urine:1375]  BLOOD ADMINISTERED:none  DRAINS: none   LOCAL MEDICATIONS USED:  MARCAINE     SPECIMEN:  No Specimen  DISPOSITION OF SPECIMEN:  N/A  COUNTS:  YES  TOURNIQUET:  * No tourniquets in log *  DICTATION: .Dragon Dictation  PLAN OF CARE: Admit to inpatient   PATIENT DISPOSITION:  PACU - hemodynamically stable.   Delay start of Pharmacological VTE agent (>24hrs) due to surgical blood loss or risk of bleeding: yes

## 2014-12-13 NOTE — Progress Notes (Signed)
ANTIBIOTIC CONSULT NOTE - INITIAL  Pharmacy Consult for Vancomycin Indication: Surgical prophylaxis  Allergies  Allergen Reactions  . Amoxicillin Hives and Rash    Patient Measurements: Height: 5\' 3"  (160 cm) Weight: (!) 341 lb 7.9 oz (154.9 kg) IBW/kg (Calculated) : 52.4 Vital Signs: Temp: 98.7 F (37.1 C) (01/14 1915) BP: 149/78 mmHg (01/14 1906) Pulse Rate: 113 (01/14 1915) Intake/Output from previous day: 01/13 0701 - 01/14 0700 In: -  Out: 1225 [Urine:1225] Intake/Output from this shift: Total I/O In: 150 [I.V.:150] Out: -   Labs:  Recent Labs  12/10/14 2148 12/11/14 0223  WBC 11.6*  --   HGB 10.2*  --   PLT 344  --   CREATININE 0.82 0.73   Estimated Creatinine Clearance: 144.7 mL/min (by C-G formula based on Cr of 0.73). No results for input(s): VANCOTROUGH, VANCOPEAK, VANCORANDOM, GENTTROUGH, GENTPEAK, GENTRANDOM, TOBRATROUGH, TOBRAPEAK, TOBRARND, AMIKACINPEAK, AMIKACINTROU, AMIKACIN in the last 72 hours.   Microbiology: No results found for this or any previous visit (from the past 720 hour(s)).  Medical History: Past Medical History  Diagnosis Date  . Hypertension   . Asthma   . Type II diabetes mellitus   . History of blood transfusion     "after I had one of my kids"  . Sickle cell disease   . Anemia   . Depression   . Ovarian cyst   . Renal disorder     kidney stones    Medications:  Anti-infectives    Start     Dose/Rate Route Frequency Ordered Stop   12/13/14 0922  bacitracin 50,000 Units in sodium chloride irrigation 0.9 % 500 mL irrigation  Status:  Discontinued       As needed 12/13/14 0922 12/13/14 1116   12/13/14 0845  ceFAZolin (ANCEF) 3 g in dextrose 5 % 50 mL IVPB     3 g160 mL/hr over 30 Minutes Intravenous  Once 12/13/14 0831       Assessment: 34 year old female s/p cervial laminectomy for tumor removal to receive vancomycin post-op. No drain is in place. SCr 0.73/estimated CrCl ~144 mL/min. No vancomycin documented  pre-operatively.   Goal of Therapy:  Vancomycin trough level 10-15 mcg/ml  Plan:  Vancomycin 1500 mg IV x1 dose.  Pharmacy will sign off as consult for 1 time dose and no drain in place.   Sloan Leiter, PharmD, BCPS Clinical Pharmacist [phone] 12/13/2014,8:11 PM

## 2014-12-13 NOTE — Progress Notes (Signed)
Orthopedic Tech Progress Note Patient Details:  Cynthia Rosales 1981-04-13 [id number]  Ortho Devices Type of Ortho Device: Soft collar Ortho Device/Splint Location: neck Ortho Device/Splint Interventions: Application   Dennys Traughber 12/13/2014, 3:16 PM

## 2014-12-13 NOTE — Progress Notes (Signed)
Postop check  Patient complains of neck pain. Complains of numbness of her extremities. Overall pain level and relative sedation makes full assessment difficult.  Patient with voluntary movement of both upper and lower extremities bilaterally. Grips are weak but present bilaterally. Better than antigravity strength in her triceps and biceps bilaterally. Deltoid muscle group not tested secondary to poor patient effort. Lower extremity strength appears to also be better than antigravity although once again very limited by poor effort and complaints of pain. Patient will voluntarily move both feet somewhat more spontaneously on the left than right. Dressing dry.  Patient's pain level and uncooperativeness make full assessment somewhat challenging. She appears to have good motor function in her upper and lower extremities but is somewhat inhibited by pain and effort. I will continue to observe over the next couple hours. At this point I do not see any indication for surgical reexploration or re-imaging.

## 2014-12-13 NOTE — Anesthesia Postprocedure Evaluation (Signed)
  Anesthesia Post-op Note  Patient: Cynthia Rosales  Procedure(s) Performed: Procedure(s) with comments: CERVICAL LAMINECTOMY FOR INTRADURAL TUMOR (N/A) - posterior  Patient Location: PACU  Anesthesia Type:General  Level of Consciousness: awake and alert   Airway and Oxygen Therapy: Patient Spontanous Breathing  Post-op Pain: none  Post-op Assessment: Post-op Vital signs reviewed  Post-op Vital Signs: Reviewed  Last Vitals:  Filed Vitals:   12/13/14 1730  BP:   Pulse: 108  Temp:   Resp: 24    Complications: No apparent anesthesia complications

## 2014-12-13 NOTE — Op Note (Signed)
Date of procedure: 12/13/2014  Date of dictation: Same  Service: Neurosurgery  Preoperative diagnosis: Cervical intramedullary spinal cord tumor  Postoperative diagnosis: Same  Procedure Name: C3-C6 laminectomy with resection of intramedullary spinal cord neoplasm, microdissection, intraoperative ultrasound  Surgeon:Naira Standiford A.Sydnei Ohaver, M.D.  Asst. 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 Workup demonstrates evidence of an enhancing intramedullary central spinal cord tumor at C4-5 with marked expansion of the cord. Patient presents now for cervical laminectomy with biopsy and/or resection of this mass.  Operative note: After induction of anesthesia, patient position prone onto bolsters with her head fixed in a [place] pin headrest. Patient's posterior cervical region prepped and draped sterilely. Incision made from C2-C6. Subperiosteal dissection performed bilaterally. Retractor placed. X-ray taken. Levels confirmed. Laminectomy performed using Leksell rongeurs Kerrison rongeurs the high-speed drill to remove the inferior three quarters of the lamina of C3 the entire lamina of C4 the entire lamina of C5 and the superior one half of the lamina of C6. Ligament flavum elevated and resected. The dura was elevated and a midline durotomy was made. Dural tack up sutures were performed bilaterally. The field was flooded. Ultrasound was used and the spinal cord tumor was localized and confirmed in position. Grossly the spinal cord was widened and abnormal appearing at this level. Microscope was used for microdissection of the spinal canal and spinal cord. Midline was identified and a midline myelotomy was performed using a 11 blade. Dissection then proceeded into the cord itself and a purplish neoplasm was encountered. Tissue was removed and sent for frozen evaluation by our pathologist. He agreed there was pathologic  tissue present but could not give a definitive diagnosis. The myelotomy was expanded cephalad and slightly caudally as well. Using microdissection the tumor was gradually freed from its capsular attachment and removed in a number of large pieces. 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 A gross total resection was performed. There were no untoward events during the resection itself. The tumor cavity was inspected and found to have good hemostasis. The tumor bed was irrigated one final time. Dura was then re-approximated using a 5-0 running Prolene. DuraSeal was placed over the dural repair. Gelfoam was placed over the laminectomy defect. Wounds and close in layers with Vicryl sutures. Steri-Strips and sterile dressing were applied to the surface. There were no apparent complications. Patient tolerated the procedure well and she returns to the recovery room postop.

## 2014-12-13 NOTE — Transfer of Care (Signed)
Immediate Anesthesia Transfer of Care Note  Patient: Cynthia Rosales  Procedure(s) Performed: Procedure(s) with comments: CERVICAL LAMINECTOMY FOR INTRADURAL TUMOR (N/A) - posterior  Patient Location: PACU  Anesthesia Type:General  Level of Consciousness: awake, alert  and oriented  Airway & Oxygen Therapy: Patient Spontanous Breathing and Patient connected to face mask oxygen  Post-op Assessment: Report given to PACU RN and Post -op Vital signs reviewed and stable  Post vital signs: Reviewed and stable  Complications: No apparent anesthesia complications

## 2014-12-14 DIAGNOSIS — G8250 Quadriplegia, unspecified: Secondary | ICD-10-CM

## 2014-12-14 DIAGNOSIS — D434 Neoplasm of uncertain behavior of spinal cord: Secondary | ICD-10-CM

## 2014-12-14 LAB — GLUCOSE, CAPILLARY
Glucose-Capillary: 105 mg/dL — ABNORMAL HIGH (ref 70–99)
Glucose-Capillary: 117 mg/dL — ABNORMAL HIGH (ref 70–99)
Glucose-Capillary: 135 mg/dL — ABNORMAL HIGH (ref 70–99)
Glucose-Capillary: 81 mg/dL (ref 70–99)
Glucose-Capillary: 89 mg/dL (ref 70–99)

## 2014-12-14 MED ORDER — WHITE PETROLATUM GEL
Status: AC
  Administered 2014-12-14: 0.2
  Filled 2014-12-14: qty 1

## 2014-12-14 NOTE — Progress Notes (Signed)
Rehab admissions - Evaluated for possible acute inpatient rehab admission.  Appropriate for inpatient rehab.  I will follow up on Monday for progress and medical readiness for inpatient rehab.  Call me for questions.  #[phone]

## 2014-12-14 NOTE — Progress Notes (Signed)
Postoperative day 1. Patient complains of neck pain and numbness.  Afebrile. Vitals are stable. Urine output good.  Condition awake but sedated somewhat on narcotic analgesics. Patient with significant deltoid and biceps weakness on the right side. 3 over 5 to forward 5 strength in her right triceps. Grip 4 minus over 5 on the right. Left upper extremity biceps 3 over 5 triceps 4 over 5 grip 45. Lower extremities voluntarily moves toes bilaterally. Antigravity  left LE. Appears to have antigravity strength on R but overall effort poor.  Drsg clean and dry.  Doing fairly well.  Mobilize with pt

## 2014-12-14 NOTE — Clinical Social Work Note (Signed)
Clinical Social Worker received referral for possible ST-SNF placement.  Chart reviewed.  PT/OT recommending inpatient rehab.  Spoke with RN Case Manager who will follow up with patient to discuss home health needs if inpatient rehab not appropriate and/or approved at discharge.    CSW signing off - please re consult if social work needs arise.  Barbette Or, [place]

## 2014-12-14 NOTE — Progress Notes (Signed)
Rehab Admissions Coordinator Note:  Patient was screened by Cleatrice Burke for appropriateness for an Inpatient Acute Rehab Consult per PT recommendation  At this time, we are recommending Inpatient Rehab consult. Please place order.  Cleatrice Burke 12/14/2014, 9:27 AM  I can be reached at [phone].

## 2014-12-14 NOTE — Consult Note (Signed)
Physical Medicine and Rehabilitation Consult  Reason for Consult: Cervical myelopathy Referring Physician: Dr. Annette Stable   HPI: Cynthia Rosales is a 34 y.o. female with history of HTN, DM, morbid obesity, SSD, progressive neck pain with radiation to BUE and recent MRI with C4-C5 expansile mass. She was admitted for further work up via [place] office on 12/10/14 due reports of recent loss of B/B function and started on IV steroids. 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  She was taken to OR on 12/13/13 for C3-C5 laminectomy with resection of intramedullary tumor by Dr. Annette Stable.    Review of Systems  HENT: Negative for hearing loss.   Eyes: Negative for blurred vision and double vision.  Respiratory: Positive for shortness of breath. Negative for cough.   Cardiovascular: Positive for chest pain (diffuse chest wall pain).  Gastrointestinal: Negative for nausea and vomiting.  Musculoskeletal: Positive for myalgias and back pain.  Neurological: Positive for sensory change, focal weakness and headaches.   Past Medical History  Diagnosis Date  . Hypertension   . Asthma   . Type II diabetes mellitus   . History of blood transfusion     "after I had one of my kids"  . Sickle cell disease   . Anemia   . Depression   . Ovarian cyst   . Renal disorder     kidney stones   Past Surgical History  Procedure Laterality Date  . Cesarean section  2000; 2007; 2011  . Dilation and curettage of uterus    . Appendectomy  2013  . Reduction mammaplasty Bilateral 1998   Family History  Problem Relation Age of Onset  . Breast cancer Maternal Aunt   . Ovarian cancer Maternal Grandmother   . Breast cancer Maternal Aunt   . Migraines Neg Hx    Social History:  Lives with family. Independent without AD prior to admission. She reports that she has never smoked. She has never used smokeless tobacco. She reports that she drinks  alcohol. She reports that she does not use illicit drugs.    Allergies  Allergen Reactions  . Amoxicillin Hives and Rash   Medications Prior to Admission  Medication Sig Dispense Refill  . hydrOXYzine (VISTARIL) 25 MG capsule Take 25 mg by mouth at bedtime.    . insulin aspart (NOVOLOG) 100 UNIT/ML injection Inject 20 Units into the skin 3 (three) times daily.     . insulin glargine (LANTUS) 100 UNIT/ML injection Inject 50 Units into the skin 2 (two) times daily.     Marland Kitchen losartan (COZAAR) 25 MG tablet Take 25 mg by mouth daily.    . metFORMIN (GLUCOPHAGE) 1000 MG tablet Take 1,000 mg by mouth 2 (two) times daily.    . metoprolol tartrate (LOPRESSOR) 25 MG tablet Take 25 mg by mouth daily.    Marland Kitchen topiramate (TOPAMAX) 25 MG tablet Take 2 tablets (50 mg total) by mouth 2 (two) times daily. 120 tablet 3    Home: Home Living Family/patient expects to be discharged to:: Inpatient rehab Living Arrangements: Spouse/significant other, Children  Functional History: Prior Function Level of Independence: Needs assistance ADL's / Homemaking Assistance Needed: Boyfriend had started Awith lower body dressing and Homemaking.   Functional Status:  Mobility: Bed Mobility Overal bed mobility: Needs Assistance Bed Mobility: Supine to Sit, Sit to Supine Supine to sit: Max assist, HOB elevated Sit to supine: Max assist General bed mobility  comments: pt needs A for Bil LEs and bringing trunk up to sitting.  pt does attempt to use UEs, but is limited by pain.          ADL:    Cognition: Cognition Overall Cognitive Status: Within Functional Limits for tasks assessed Orientation Level: Oriented X4 Cognition Arousal/Alertness: Awake/alert Behavior During Therapy: WFL for tasks assessed/performed Overall Cognitive Status: Within Functional Limits for tasks assessed  Blood pressure 159/103, pulse 124, temperature 98.6 F (37 C), temperature source Oral, resp. rate 23, height 5\' 4"  (1.626 m), weight  153.6 kg (338 lb 10 oz), last menstrual period 11/18/2014, SpO2 95 %, unknown if currently breastfeeding. Physical Exam  Nursing note and vitals reviewed. Constitutional: She is oriented to person, place, and time. She appears well-developed and well-nourished. She appears listless. She is easily aroused. Nasal cannula in place.  Morbidly obese female with soft collar in place.   HENT:  Head: Normocephalic and atraumatic.  Eyes: Conjunctivae are normal. Pupils are equal, round, and reactive to light.  Neck:  Decreased ROM.   Cardiovascular: Regular rhythm.  Tachycardia present.   Heart rate 120 with minimal activity  Respiratory: Effort normal and breath sounds normal. No respiratory distress. She has no wheezes.  GI: Soft. Bowel sounds are normal. She exhibits no distension. There is no tenderness.  Neurological: She is oriented to person, place, and time and easily aroused. She appears listless.  Tetraplegia with sensory deficits R>L. Has trace grip RUE. 1/5 grip, wrist, ?bicep/tricep. Sensation 0/2 RUE adn RLE. Has 1/2 trunk and left upper. Trace left lower ext. DTR's absent  Skin: Skin is warm and dry.  Psychiatric:  Very flat and subdued    Results for orders placed or performed during the hospital encounter of 12/10/14 (from the past 24 hour(s))  Glucose, capillary     Status: Abnormal   Collection Time: 12/13/14 11:55 AM  Result Value Ref Range   Glucose-Capillary 181 (H) 70 - 99 mg/dL  Glucose, capillary     Status: Abnormal   Collection Time: 12/13/14  6:02 PM  Result Value Ref Range   Glucose-Capillary 167 (H) 70 - 99 mg/dL  MRSA PCR Screening     Status: None   Collection Time: 12/13/14  9:30 PM  Result Value Ref Range   MRSA by PCR NEGATIVE NEGATIVE  Glucose, capillary     Status: None   Collection Time: 12/14/14 12:09 AM  Result Value Ref Range   Glucose-Capillary 81 70 - 99 mg/dL  Glucose, capillary     Status: Abnormal   Collection Time: 12/14/14  8:20 AM    Result Value Ref Range   Glucose-Capillary 105 (H) 70 - 99 mg/dL   Dg Cervical Spine 1 View  12/13/2014   CLINICAL DATA:  Cervical laminectomy for intradural tumor.  EXAM: DG CERVICAL SPINE - 1 VIEW  COMPARISON:  MRI of the cervical spine 12/13/2013.  FINDINGS: Single cross-table lateral view of the cervical spine is severely limited by patient positioning with lack of visualization of the spine below the superior aspect of C4. With these limitations in mind, there are soft tissue retractors in place posterior to C4 and C5, with a surgical probe in place posterior to the inferior aspect of C4.  IMPRESSION: 1. Intraoperative localization for cervical spine surgery, as above.   Electronically Signed   By: Vinnie Langton M.D.   On: 12/13/2014 12:43   [place] Intraoperative  12/13/2014   CLINICAL DATA:    Ultrasound was provided for  use by the ordering physician, and a technical  charge was applied by the performing facility.  No radiologist  interpretation/professional services rendered.     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 Does the need for close, 24 hr/day medical supervision in concert with the patient's rehab needs make it unreasonable for this patient to be served in a less intensive setting? Yes and Potentially 2. Co-Morbidities requiring supervision/potential complications: dm, htn,  3. Due to bladder management, bowel management, safety, skin/wound care, disease management, medication administration, pain management and patient education, does the patient require 24 hr/day rehab nursing? Yes 4. Does the patient require coordinated care of a physician, rehab nurse, PT (1-2 hrs/day, 5 days/week), OT (1-2 hrs/day, 5 days/week) and SLP (1-2 hrs/day, 5 days/week) to address physical and functional deficits in the context of the above medical diagnosis(es)? Yes Addressing deficits in the following areas: balance, endurance, locomotion, strength, transferring,  bowel/bladder control, bathing, dressing, feeding, grooming, toileting, speech, swallowing and psychosocial support 5. Can the patient actively participate in an intensive therapy program of at least 3 hrs of therapy per day at least 5 days per week? Yes 6. The potential for patient to make measurable gains while on inpatient rehab is good 7. Anticipated functional outcomes upon discharge from inpatient rehab are max assist  with PT, max assist with OT, min assist and mod assist with SLP. 8. Estimated rehab length of stay to reach the above functional goals is: 30-40 days 9. Does the patient have adequate social supports and living environment to accommodate these discharge functional goals? Potentially 10. Anticipated D/C setting: Home 11. Anticipated post D/C treatments: HH therapy and Outpatient therapy 12. Overall Rehab/Functional Prognosis: good  RECOMMENDATIONS: This patient's condition is appropriate for continued rehabilitative care in the following setting: CIR Patient has agreed to participate in recommended program. Yes Note that insurance prior authorization may be required for reimbursement for recommended care.  Comment: Rehab Admissions Coordinator to follow up.  Thanks,  Meredith Staggers, MD, Mellody Drown     12/14/2014

## 2014-12-14 NOTE — Evaluation (Signed)
Physical Therapy Evaluation Patient Details Name: Cynthia Rosales MRN: [id number] DOB: 26-Jun-1981 Today's Date: 12/14/2014   History of Present Illness  pt presents with C3-6 Lami with tumor resection.    Clinical Impression  Pt with impaired soft touch sensation and pain sensation.  Pt with only trace active movement in R LE and slightly more in L LE.  Pt very painful, but agreeable to attempt mobility.  Feel pt will need CIR level of therapies at D/C to maximize independence and decrease burden of care prior to returning to home.      Follow Up Recommendations CIR    Equipment Recommendations  None recommended by PT    Recommendations for Other Services Rehab consult     Precautions / Restrictions Precautions Precautions: Fall;Cervical Required Braces or Orthoses: Cervical Brace Cervical Brace: Soft collar;At all times Restrictions Weight Bearing Restrictions: No      Mobility  Bed Mobility Overal bed mobility: Needs Assistance Bed Mobility: Supine to Sit;Sit to Supine     Supine to sit: Max assist;HOB elevated Sit to supine: Max assist   General bed mobility comments: pt needs A for Bil LEs and bringing trunk up to sitting.  pt does attempt to use UEs, but is limited by pain.    Transfers                    Ambulation/Gait                Stairs            Wheelchair Mobility    Modified Rankin (Stroke Patients Only)       Balance Overall balance assessment: Needs assistance Sitting-balance support: Bilateral upper extremity supported;Feet supported Sitting balance-Leahy Scale: Poor Sitting balance - Comments: pt utilizes Bil UE support and MinA to maintain balance.                                       Pertinent Vitals/Pain Pain Assessment: 0-10 Pain Score: 9  Pain Location: Neck Pain Descriptors / Indicators: Constant;Sharp Pain Intervention(s): Monitored during session;Repositioned;PCA encouraged    Home Living  Family/patient expects to be discharged to:: Inpatient rehab Living Arrangements: Spouse/significant other;Children                    Prior Function Level of Independence: Needs assistance      ADL's / Homemaking Assistance Needed: Boyfriend had started Awith lower body dressing and Homemaking.          Hand Dominance        Extremity/Trunk Assessment   Upper Extremity Assessment: Defer to OT evaluation           Lower Extremity Assessment: RLE deficits/detail;LLE deficits/detail RLE Deficits / Details: pt with only trace movements in LE.  No sensation to soft touch or painful stimuli.   LLE Deficits / Details: pt demos gross strength 1/5 with only antigravity movement being at toes.  Sensation to soft touch diminished in thigh/knee/upper calf, but absent in lower calf and foot.  pt with diminished pain sensation in entire LE with pt indicating she's able to feel it as touch, but not pain.    Cervical / Trunk Assessment: Normal  Communication   Communication: No difficulties  Cognition Arousal/Alertness: Awake/alert Behavior During Therapy: WFL for tasks assessed/performed Overall Cognitive Status: Within Functional Limits for tasks assessed  General Comments      Exercises        Assessment/Plan    PT Assessment Patient needs continued PT services  PT Diagnosis Difficulty walking;Generalized weakness;Acute pain   PT Problem List Decreased strength;Decreased activity tolerance;Decreased balance;Decreased mobility;Decreased coordination;Decreased knowledge of use of DME;Cardiopulmonary status limiting activity;Impaired sensation;Pain;Obesity  PT Treatment Interventions DME instruction;Gait training;Functional mobility training;Therapeutic exercise;Therapeutic activities;Balance training;Neuromuscular re-education;Patient/family education   PT Goals (Current goals can be found in the Care Plan section) Acute Rehab PT  Goals Patient Stated Goal: Be able to walk again.   PT Goal Formulation: With patient Time For Goal Achievement: 12/28/14 Potential to Achieve Goals: Good    Frequency Min 3X/week   Barriers to discharge        Co-evaluation               End of Session Equipment Utilized During Treatment: Cervical collar Activity Tolerance: Patient limited by pain Patient left: in bed;with call bell/phone within reach Nurse Communication: Mobility status         Time: 5537-4827 PT Time Calculation (min) (ACUTE ONLY): 35 min   Charges:   PT Evaluation $Initial PT Evaluation Tier I: 1 Procedure PT Treatments $Therapeutic Activity: 23-37 mins   PT G CodesCatarina Hartshorn, [place] 12/14/2014, 9:01 AM

## 2014-12-15 LAB — GLUCOSE, CAPILLARY
Glucose-Capillary: 188 mg/dL — ABNORMAL HIGH (ref 70–99)
Glucose-Capillary: 129 mg/dL — ABNORMAL HIGH (ref 70–99)
Glucose-Capillary: 96 mg/dL (ref 70–99)
Glucose-Capillary: 53 mg/dL — ABNORMAL LOW (ref 70–99)

## 2014-12-15 NOTE — Progress Notes (Signed)
Fentanyl PCA pump cleared of 210 mcq. Fentanyl

## 2014-12-15 NOTE — Progress Notes (Signed)
Patient ID: Cynthia Rosales, female   DOB: 02/16/1981, 34 y.o.   MRN: [id number] Awake, c/o incisional pain. Moves both upper extremities and some the right leg. Plan to get pt/ot to help

## 2014-12-16 LAB — GLUCOSE, CAPILLARY
Glucose-Capillary: 106 mg/dL — ABNORMAL HIGH (ref 70–99)
Glucose-Capillary: 133 mg/dL — ABNORMAL HIGH (ref 70–99)
Glucose-Capillary: 160 mg/dL — ABNORMAL HIGH (ref 70–99)
Glucose-Capillary: 178 mg/dL — ABNORMAL HIGH (ref 70–99)
Glucose-Capillary: 238 mg/dL — ABNORMAL HIGH (ref 70–99)
Glucose-Capillary: 258 mg/dL — ABNORMAL HIGH (ref 70–99)
Glucose-Capillary: 60 mg/dL — ABNORMAL LOW (ref 70–99)
Glucose-Capillary: 69 mg/dL — ABNORMAL LOW (ref 70–99)

## 2014-12-16 MED ORDER — HEPARIN SODIUM (PORCINE) 5000 UNIT/ML IJ SOLN
5000.0000 [IU] | Freq: Three times a day (TID) | INTRAMUSCULAR | Status: DC
  Administered 2014-12-16 – 2014-12-24 (×23): 5000 [IU] via SUBCUTANEOUS
  Filled 2014-12-16 (×28): qty 1

## 2014-12-16 NOTE — Progress Notes (Signed)
HS CBG was 60, pt alert, no other complaint. OJ given rechecked after abt 40 minutes, CBG 69. Gave another OJ drink, CBG up to 106 next check. Per pharmacy rec, Held Lantus [phone].

## 2014-12-16 NOTE — Progress Notes (Signed)
Patient ID: Cynthia Rosales, female   DOB: Sep 22, 1981, 34 y.o.   MRN: [id number] Stable, less pain . Moves both arm ,left leg and started to move toes in the right foot.plan oob, rehab consult, heparin

## 2014-12-16 NOTE — Progress Notes (Signed)
Spontaneous movement noted of right leg while giving patient a bath.

## 2014-12-17 ENCOUNTER — Inpatient Hospital Stay (HOSPITAL_COMMUNITY): Payer: Medicaid Other

## 2014-12-17 ENCOUNTER — Encounter (HOSPITAL_COMMUNITY): Payer: Self-pay | Admitting: Neurosurgery

## 2014-12-17 DIAGNOSIS — R1084 Generalized abdominal pain: Secondary | ICD-10-CM

## 2014-12-17 DIAGNOSIS — R0902 Hypoxemia: Secondary | ICD-10-CM

## 2014-12-17 DIAGNOSIS — K56609 Unspecified intestinal obstruction, unspecified as to partial versus complete obstruction: Secondary | ICD-10-CM | POA: Insufficient documentation

## 2014-12-17 DIAGNOSIS — K5660 Unspecified intestinal obstruction: Secondary | ICD-10-CM

## 2014-12-17 DIAGNOSIS — K913 Postprocedural intestinal obstruction: Secondary | ICD-10-CM

## 2014-12-17 LAB — BASIC METABOLIC PANEL
Anion gap: 11 (ref 5–15)
BUN: 29 mg/dL — ABNORMAL HIGH (ref 6–23)
CO2: 21 mmol/L (ref 19–32)
Calcium: 8.5 mg/dL (ref 8.4–10.5)
Chloride: 101 mEq/L (ref 96–112)
Creatinine, Ser: 0.93 mg/dL (ref 0.50–1.10)
GFR calc Af Amer: >90 mL/min (ref >90)
GFR calc non Af Amer: 79 mL/min — ABNORMAL LOW (ref >90)
Glucose, Bld: 248 mg/dL — ABNORMAL HIGH (ref 70–99)
Potassium: 4.3 mmol/L (ref 3.5–5.1)
Sodium: 133 mmol/L — ABNORMAL LOW (ref 135–145)

## 2014-12-17 LAB — GLUCOSE, CAPILLARY
Glucose-Capillary: 230 mg/dL — ABNORMAL HIGH (ref 70–99)
Glucose-Capillary: 241 mg/dL — ABNORMAL HIGH (ref 70–99)
Glucose-Capillary: 223 mg/dL — ABNORMAL HIGH (ref 70–99)
Glucose-Capillary: 284 mg/dL — ABNORMAL HIGH (ref 70–99)

## 2014-12-17 LAB — CBC
HCT: 33.8 % — ABNORMAL LOW (ref 36.0–46.0)
Hemoglobin: 11.3 g/dL — ABNORMAL LOW (ref 12.0–15.0)
MCH: 22.7 pg — ABNORMAL LOW (ref 26.0–34.0)
MCHC: 33.4 g/dL (ref 30.0–36.0)
MCV: 68 fL — ABNORMAL LOW (ref 78.0–100.0)
Platelets: 524 10*3/uL — ABNORMAL HIGH (ref 150–400)
RBC: 4.97 MIL/uL (ref 3.87–5.11)
RDW: 16.7 % — ABNORMAL HIGH (ref 11.5–15.5)
WBC: 21 10*3/uL — ABNORMAL HIGH (ref 4.0–10.5)

## 2014-12-17 MED ORDER — HYDRALAZINE HCL 20 MG/ML IJ SOLN
5.0000 mg | INTRAMUSCULAR | Status: DC | PRN
  Administered 2014-12-17 (×2): 20 mg via INTRAVENOUS
  Filled 2014-12-17: qty 1

## 2014-12-17 MED ORDER — LEVALBUTEROL HCL 0.63 MG/3ML IN NEBU: 0.6300 mg | INHALATION_SOLUTION | Freq: Four times a day (QID) | RESPIRATORY_TRACT | Status: DC | PRN

## 2014-12-17 MED ORDER — SODIUM CHLORIDE 0.9 % IV SOLN
INTRAVENOUS | Status: AC
  Administered 2014-12-18 (×2): via INTRAVENOUS

## 2014-12-17 MED ORDER — LABETALOL HCL 5 MG/ML IV SOLN
5.0000 mg | INTRAVENOUS | Status: DC | PRN
  Administered 2014-12-20: 20 mg via INTRAVENOUS
  Filled 2014-12-17: qty 4

## 2014-12-17 MED ORDER — TRACE MINERALS CR-CU-F-FE-I-MN-MO-SE-ZN IV SOLN
INTRAVENOUS | Status: AC
  Filled 2014-12-17: qty 960

## 2014-12-17 MED ORDER — CLINIMIX E/DEXTROSE (5/15) 5 % IV SOLN
INTRAVENOUS | Status: DC
  Filled 2014-12-17: qty 960

## 2014-12-17 MED ORDER — SODIUM CHLORIDE 0.9 % IJ SOLN
10.0000 mL | INTRAMUSCULAR | Status: DC | PRN
  Administered 2014-12-24 (×2): 10 mL
  Filled 2014-12-17 (×2): qty 40

## 2014-12-17 MED ORDER — FAT EMULSION 20 % IV EMUL
240.0000 mL | INTRAVENOUS | Status: AC
  Filled 2014-12-17: qty 250

## 2014-12-17 MED ORDER — METHOCARBAMOL 1000 MG/10ML IJ SOLN
750.0000 mg | INTRAVENOUS | Status: DC | PRN
  Administered 2014-12-17 – 2014-12-22 (×7): 750 mg via INTRAVENOUS
  Filled 2014-12-17 (×14): qty 7.5

## 2014-12-17 MED ORDER — FAT EMULSION 20 % IV EMUL
240.0000 mL | INTRAVENOUS | Status: DC
  Filled 2014-12-17: qty 250

## 2014-12-17 MED ORDER — SODIUM CHLORIDE 0.45 % IV SOLN
INTRAVENOUS | Status: DC
  Administered 2014-12-17: 14:00:00 via INTRAVENOUS

## 2014-12-17 MED ORDER — SODIUM CHLORIDE 0.9 % IV SOLN
INTRAVENOUS | Status: AC
  Administered 2014-12-17: 17:00:00 via INTRAVENOUS

## 2014-12-17 MED ORDER — PANTOPRAZOLE SODIUM 40 MG IV SOLR
40.0000 mg | INTRAVENOUS | Status: DC
  Administered 2014-12-17 – 2014-12-21 (×5): 40 mg via INTRAVENOUS
  Filled 2014-12-17 (×5): qty 40

## 2014-12-17 MED ORDER — HYDRALAZINE HCL 20 MG/ML IJ SOLN
INTRAMUSCULAR | Status: AC
  Filled 2014-12-17: qty 1

## 2014-12-17 MED ORDER — HYDRALAZINE HCL 20 MG/ML IJ SOLN
20.0000 mg | INTRAMUSCULAR | Status: DC | PRN
  Administered 2014-12-17: 20 mg via INTRAVENOUS
  Filled 2014-12-17: qty 1

## 2014-12-17 MED ORDER — LIDOCAINE HCL (PF) 1 % IJ SOLN
INTRAMUSCULAR | Status: AC
  Administered 2014-12-17: 14:00:00
  Filled 2014-12-17: qty 5

## 2014-12-17 MED ORDER — METOPROLOL TARTRATE 1 MG/ML IV SOLN
5.0000 mg | Freq: Three times a day (TID) | INTRAVENOUS | Status: DC
  Administered 2014-12-17 – 2014-12-21 (×12): 5 mg via INTRAVENOUS
  Filled 2014-12-17 (×14): qty 5

## 2014-12-17 MED ORDER — LABETALOL HCL 5 MG/ML IV SOLN
0.5000 mg/min | INTRAVENOUS | Status: DC
  Administered 2014-12-17: 3 mg/min via INTRAVENOUS
  Administered 2014-12-17: 0.5 mg/min via INTRAVENOUS
  Administered 2014-12-18: 3 mg/min via INTRAVENOUS
  Filled 2014-12-17 (×4): qty 100

## 2014-12-17 MED ORDER — SODIUM CHLORIDE 0.9 % IJ SOLN
10.0000 mL | Freq: Two times a day (BID) | INTRAMUSCULAR | Status: DC
  Administered 2014-12-18 – 2014-12-19 (×4): 10 mL
  Administered 2014-12-20: 20 mL
  Administered 2014-12-20 – 2014-12-23 (×3): 10 mL

## 2014-12-17 MED ORDER — INSULIN ASPART 100 UNIT/ML ~~LOC~~ SOLN
0.0000 [IU] | SUBCUTANEOUS | Status: DC
  Administered 2014-12-17 (×2): 7 [IU] via SUBCUTANEOUS
  Administered 2014-12-17: 11 [IU] via SUBCUTANEOUS
  Administered 2014-12-18: 4 [IU] via SUBCUTANEOUS
  Administered 2014-12-18: 11 [IU] via SUBCUTANEOUS
  Administered 2014-12-18: 7 [IU] via SUBCUTANEOUS
  Administered 2014-12-18: 4 [IU] via SUBCUTANEOUS
  Administered 2014-12-19: 3 [IU] via SUBCUTANEOUS
  Administered 2014-12-19 (×2): 11 [IU] via SUBCUTANEOUS
  Administered 2014-12-19: 7 [IU] via SUBCUTANEOUS
  Administered 2014-12-19: 11 [IU] via SUBCUTANEOUS
  Administered 2014-12-19: 7 [IU] via SUBCUTANEOUS
  Administered 2014-12-20: 4 [IU] via SUBCUTANEOUS
  Administered 2014-12-20: 7 [IU] via SUBCUTANEOUS
  Administered 2014-12-20: 4 [IU] via SUBCUTANEOUS
  Administered 2014-12-20: 7 [IU] via SUBCUTANEOUS
  Administered 2014-12-20: 4 [IU] via SUBCUTANEOUS
  Administered 2014-12-20: 7 [IU] via SUBCUTANEOUS
  Administered 2014-12-21: 4 [IU] via SUBCUTANEOUS
  Administered 2014-12-21 (×2): 7 [IU] via SUBCUTANEOUS
  Administered 2014-12-21: 3 [IU] via SUBCUTANEOUS
  Administered 2014-12-21: 7 [IU] via SUBCUTANEOUS
  Administered 2014-12-21: 4 [IU] via SUBCUTANEOUS
  Administered 2014-12-22: 3 [IU] via SUBCUTANEOUS
  Administered 2014-12-22: 7 [IU] via SUBCUTANEOUS
  Administered 2014-12-22: 4 [IU] via SUBCUTANEOUS

## 2014-12-17 NOTE — Procedures (Signed)
Central Venous Catheter Insertion Procedure Note Zavia Pullen [id number] 12-18-1980  Procedure: Insertion of Central Venous Catheter Indications: Assessment of intravascular volume, Drug and/or fluid administration and Frequent blood sampling  Procedure Details Consent: Risks of procedure as well as the alternatives and risks of each were explained to the (patient/caregiver).  Consent for procedure obtained. Time Out: Verified patient identification, verified procedure, site/side was marked, verified correct patient position, special equipment/implants available, medications/allergies/relevent history reviewed, required imaging and test results available.  Performed  Maximum sterile technique was used including antiseptics, cap, gloves, gown, hand hygiene, mask and sheet. Skin prep: Chlorhexidine; local anesthetic administered A antimicrobial bonded/coated triple lumen catheter was placed in the left subclavian vein using the Seldinger technique.  Evaluation Blood flow good Complications: No apparent complications Patient did tolerate procedure well. Chest X-ray ordered to verify placement.  CXR: pending.  U/S used in placement.  YACOUB,WESAM 12/17/2014, 2:14 PM

## 2014-12-17 NOTE — Progress Notes (Signed)
[facility] Progress Note Patient Name: Cynthia Rosales DOB: [birth year] MRN: [id number]   Date of Service  12/17/2014  HPI/Events of Note  SBP still >100  eICU Interventions  Plan to add prn hydralazine      Intervention Category Major Interventions: Hypertension - evaluation and management  Asencion Noble 12/17/2014, 7:09 PM

## 2014-12-17 NOTE — Progress Notes (Signed)
PT Cancellation Note  Patient Details Name: Kathern Lobosco MRN: [id number] DOB: 26-Mar-1981   Cancelled Treatment:    Reason Eval/Treat Not Completed: Medical issues which prohibited therapy;Pain limiting ability to participate.  Spoke with RN who indicates increased pain today.  Will try another time.     Kollyns Mickelson, Thornton Papas 12/17/2014, 10:24 AM

## 2014-12-17 NOTE — Evaluation (Signed)
Occupational Therapy Evaluation Patient Details Name: Shigeko Manard MRN: [id number] DOB: 01-31-1981 Today's Date: 12/17/2014    History of Present Illness 34 year old female with a two-year history of progressive neck pain with radiation into both upper extremities with progressive numbness weakness and recent loss of bowel and bladder function and now s/p C3-6 Lami with tumor resection.     Clinical Impression   This 34 yo female admitted and underwent above presents to acute OT with decreased AROM/strength of Bil UEs (right worse than left), decreased coordination of RUE, increased pain all over, decreased mobility, decreased balance, and obesity all affecting her ability to care for herself at a min A level for LB ADLs (that she had gotten to not long before admission). She would benefit from continued OT on CIR to work back towards this Min A level or better.    Follow Up Recommendations  CIR    Equipment Recommendations   (TBD next venue)       Precautions / Restrictions Precautions Precautions: Fall;Cervical Required Braces or Orthoses: Cervical Brace (laying on table in room when I arrived) Restrictions Weight Bearing Restrictions: No      Mobility Bed Mobility               General bed mobility comments: Pt in 10/10 pain and nauseated due to ileus with NGtube just placed--so this was deferred this session            ADL Overall ADL's : Needs assistance/impaired Eating/Feeding: Set up;Supervision/ safety;Bed level (for ice chips, currently NPO due to ileus)   Grooming: Moderate assistance;Bed level   Upper Body Bathing: Maximal assistance;Bed level (increased body habitus)   Lower Body Bathing: Total assistance;Bed level   Upper Body Dressing : Total assistance;Bed level   Lower Body Dressing: Total assistance;Bed level                                 Pertinent Vitals/Pain Pain Assessment: 0-10 Pain Score: 10-Worst pain ever Pain  Location: Neck, back, Bil LEs Pain Descriptors / Indicators: Aching;Constant;Sore;Shooting Pain Intervention(s): Monitored during session;Repositioned     Hand Dominance Right   Extremity/Trunk Assessment Upper Extremity Assessment Upper Extremity Assessment: RUE deficits/detail;LUE deficits/detail RUE Deficits / Details: Grip 2/3, decreased opposition, wrist 3/5, forearm, tight in supination, elbow flexion/extension 2+/5, shoulder flexion 2-/5 RUE Coordination: decreased fine motor;decreased gross motor LUE Deficits / Details:  3/5 throughout, except also tight in supination, WFL for opposition LUE Coordination: decreased fine motor;decreased gross motor           Communication Communication Communication: No difficulties   Cognition Arousal/Alertness: Awake/alert Behavior During Therapy: WFL for tasks assessed/performed Overall Cognitive Status: Within Functional Limits for tasks assessed                                Home Living Family/patient expects to be discharged to:: Inpatient rehab Living Arrangements: Spouse/significant other;Children                                      Prior Functioning/Environment Level of Independence: Needs assistance    ADL's / Homemaking Assistance Needed: Boyfriend had started A with lower body dressing and Homemaking.          OT Diagnosis: Generalized weakness;Acute pain;Paresis   OT Problem List:  Decreased strength;Decreased range of motion;Decreased activity tolerance;Impaired balance (sitting and/or standing);Pain;Impaired UE functional use;Obesity;Decreased knowledge of use of DME or AE   OT Treatment/Interventions: Self-care/ADL training;DME and/or AE instruction;Patient/family education;Therapeutic exercise;Therapeutic activities;Balance training    OT Goals(Current goals can be found in the care plan section) Acute Rehab OT Goals Patient Stated Goal: Hoping to get NGtube out sooner than later OT  Goal Formulation: With patient Time For Goal Achievement: 12/31/14 Potential to Achieve Goals: Good  OT Frequency: Min 2X/week              End of Session Nurse Communication:  (safety pinned her NGtube to gown due to pt c/o it moving around )  Activity Tolerance: Patient limited by pain Patient left: in bed   Time: 4037-0964 OT Time Calculation (min): 18 min Charges:  OT General Charges $OT Visit: 1 Procedure OT Evaluation $Initial OT Evaluation Tier I: 1 Procedure OT Treatments $Therapeutic Exercise: 8-22 mins  Almon Register [phone] 12/17/2014, 12:55 PM

## 2014-12-17 NOTE — Progress Notes (Signed)
UR completed.  Await OT eval recommendation.   Sandi Mariscal, RN BSN [place] CCM Trauma/Neuro ICU Case Manager [phone]

## 2014-12-17 NOTE — Progress Notes (Signed)
At bedside to place PICC. Ultrasound assessment of bilateral upper extremities demonstrates no veins lage enough to place PICC. Diameter of PICC in vein R basilic 00% and L basilic 37%. Floor RN aware, to refer to IR and notify MD.Diana Davenport, RN

## 2014-12-17 NOTE — Progress Notes (Signed)
Rehab admissions - Evaluated for possible inpatient rehab admission.  Noted not medically ready yet.  I will follow along for progress for now.  Call me for questions.  #[phone]

## 2014-12-17 NOTE — Consult Note (Signed)
PULMONARY / CRITICAL CARE MEDICINE   Name: Cynthia Rosales MRN: [id number] DOB: [birth year]    ADMISSION DATE:  12/10/2014 CONSULTATION DATE:  12/17/2014  REFERRING MD :  Dr. Annette Stable ([facility])  CHIEF COMPLAINT:  C4-C5 mass.  INITIAL PRESENTATION: 34 year old female with who was admitted from neurosurgical office 1/11. She c/o neck pain with radiation to BUE and loss of bowel/bladder function. MRI showed C4-C5 mass. Taken to OR 1/14.   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 This lesion enhances on today's post-contrast sequences, consistent with a neoplastic process. 1/18 Abdominal Xray > Generalized bowel dilation, suspected ileus or enteritis.  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, microdissection  HISTORY OF PRESENT ILLNESS:  Cynthia Rosales is a 34 y.o. female with history of HTN, DM, morbid obesity, SSD, progressive neck pain with radiation to BUE and recent MRI with C4-C5 expansile mass. She was admitted for further work up via [place] office on 12/10/14 due reports of recent loss of B/B function and started on IV steroids. 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 She was taken to OR on 12/13/13 for C3-C5 laminectomy with resection of intramedullary tumor by Dr. Annette Stable. She has had residual weakness since that time. 1/18 found to have bowel dilation on KUB. PCCM consulted  PAST MEDICAL HISTORY :   has a past medical history of Hypertension; Asthma; Type II diabetes mellitus; History of blood transfusion; Sickle cell disease; Anemia; Depression; Ovarian cyst; and Renal disorder.  has past surgical history that includes Cesarean section (2000; 2007; 2011); Dilation and curettage of uterus; Appendectomy (2013); and  Reduction mammaplasty (Bilateral, 1998). Prior to Admission medications   Medication Sig Start Date End Date Taking? Authorizing Provider  hydrOXYzine (VISTARIL) 25 MG capsule Take 25 mg by mouth at bedtime.   Yes Historical Provider, MD  insulin aspart (NOVOLOG) 100 UNIT/ML injection Inject 20 Units into the skin 3 (three) times daily.    Yes Historical Provider, MD  insulin glargine (LANTUS) 100 UNIT/ML injection Inject 50 Units into the skin 2 (two) times daily.    Yes Historical Provider, MD  losartan (COZAAR) 25 MG tablet Take 25 mg by mouth daily.   Yes Historical Provider, MD  metFORMIN (GLUCOPHAGE) 1000 MG tablet Take 1,000 mg by mouth 2 (two) times daily.   Yes Historical Provider, MD  metoprolol tartrate (LOPRESSOR) 25 MG tablet Take 25 mg by mouth daily.   Yes Historical Provider, MD  topiramate (TOPAMAX) 25 MG tablet Take 2 tablets (50 mg total) by mouth 2 (two) times daily. 11/19/14  Yes Melvenia Beam, MD   Allergies  Allergen Reactions  . Amoxicillin Hives and Rash    FAMILY HISTORY:  indicated that her mother is alive. She indicated that her father is deceased.  SOCIAL HISTORY:  reports that she has never smoked. She has never used smokeless tobacco. She reports that she drinks alcohol. She reports that she does not use illicit drugs.  REVIEW OF SYSTEMS:    Bolds are positive  Constitutional: weight loss, gain, night sweats, Fevers, chills, fatigue .  HEENT: headaches, Sore throat, sneezing, nasal congestion, post nasal drip, Difficulty swallowing, Tooth/dental problems, visual complaints visual changes, ear ache, neck pain CV:  chest pain, radiates: ,  Orthopnea, PND, swelling in lower extremities, dizziness, palpitations, syncope.  GI  heartburn, indigestion, abdominal pain, nausea, vomiting, diarrhea, change in bowel habits, loss of appetite, bloody stools. constipation Resp: cough, productive: , hemoptysis, dyspnea, chest pain, pleuritic.  Skin: rash or itching or  icterus GU: dysuria, change in color of urine, urgency or frequency. flank pain, hematuria  MS: joint pain or swelling. decreased range of motion  Psych: change in mood or affect. depression or anxiety.  Neuro: difficulty with speech, weakness to RUE, LLE, RLE, numbness x 4 extremities, worse in legs, RLE worst, ataxia    SUBJECTIVE:   VITAL SIGNS: Temp:  [97.3 F (36.3 C)-99.3 F (37.4 C)] 98.6 F (37 C) (01/18 0800) Pulse Rate:  [102-146] 146 (01/18 0800) Resp:  [16-29] 25 (01/18 0800) BP: (126-163)/(75-110) 133/84 mmHg (01/18 0800) SpO2:  [94 %-100 %] 95 % (01/18 0800) HEMODYNAMICS:   VENTILATOR SETTINGS:   INTAKE / OUTPUT:  Intake/Output Summary (Last 24 hours) at 12/17/14 1025 Last data filed at 12/17/14 0600  Gross per 24 hour  Intake    840 ml  Output   1701 ml  Net   -861 ml    PHYSICAL EXAMINATION: General:  Morbidly obese female in NAD Neuro:  Alert, oriented. HEENT:  NA/AT, PERRL, no JVD noted Cardiovascular:  Tachy, regular, no MRG noted Lungs:  Clear, distant bilateral breath sounds. Normal WOB Abdomen:  Obese, soft, non-tender. Hypoactive bowel sounds.  Musculoskeletal:  Decreased sensation to RUE, LLE, no sensation to distal RLE. Decreased ROM to all extremities. Worst RLE. 2/5.  Skin:  Grossly intact  LABS:  CBC  Recent Labs Lab 12/10/14 2148  WBC 11.6*  HGB 10.2*  HCT 31.8*  PLT 344   Coag's No results for input(s): APTT, INR in the last 168 hours. BMET  Recent Labs Lab 12/10/14 2148 12/11/14 0223  NA 136 137  K 3.7 3.8  CL 105 104  CO2 19 27  BUN 16 15  CREATININE 0.82 0.73  GLUCOSE 149* 116*   Electrolytes  Recent Labs Lab 12/10/14 2148 12/11/14 0223  CALCIUM 8.9 8.7   Sepsis Markers No results for input(s): LATICACIDVEN, PROCALCITON, O2SATVEN in the last 168 hours. ABG No results for input(s): PHART, PCO2ART, PO2ART in the last 168 hours. Liver Enzymes  Recent Labs Lab 12/10/14 2148 12/11/14 0223  AST 17 16   ALT 14 13  ALKPHOS 90 84  BILITOT 0.3 0.3  ALBUMIN 3.1* 2.8*   Cardiac Enzymes No results for input(s): TROPONINI, PROBNP in the last 168 hours. Glucose  Recent Labs Lab 12/16/14 0321 12/16/14 0741 12/16/14 1210 12/16/14 1721 12/16/14 2200 12/17/14 0719  GLUCAP 133* 160* 258* 238* 178* 230*    Imaging No results found.   ASSESSMENT / PLAN:  PULMONARY A: H/o Asthma Concern for OHS  P:   PRN Xopenex Monitor  CARDIOVASCULAR A:  Sinus tachycardia H/o HTN  P:  Hold PO antihypertensives in setting of ileus IV metoprolol 5mg  TID PRN labetalol  Maintenance IVF hydration: 1/2 NS at 100 mL/hr   RENAL A:   No acute issues  P:   Repeat Bmet  GASTROINTESTINAL A:   Ileus, suspect due to pain medications Nutrition  P:   NPO NGT to intermittent suction SUP: IV protonix Dietary consult for TPN Place PICC  HEMATOLOGIC A:   Sickle cell disease  P:  Repeat CBC Heparin for DVT ppx Monitor  INFECTIOUS A:   No evidence for infection  P:   Recheck CBC Monitor clinically  ENDOCRINE A:   DM 2  P:   Hold lantus Hold metformin CBG monitoring and SSI to q 4 hours while NPO  NEUROLOGIC A:   C4-C5 mass s/p resection with weakness and sensation loss to extremitites  P:   RASS goal: 0 Neurosurgery primary Decadron Holding PO flexeril, Topamax, dilaudid [place]  FAMILY  - Updates:   - Inter-disciplinary family meet or Palliative Care meeting due by:  1/19 is ICU day [place], AGACNP-BC [facility] Pulmonology/Critical Care Pager [phone] or [phone]  TODAY'S SUMMARY: 34 year old female with history of sickle cell disease presenting to the hospital with a cervical spine tumor.  Post resection patient developed ileus.  NGT was placed and PCCM consulted for medical management.  NGT in place and to suction.  NPO.  TPN as ordered.  PICC line to be placed.  Attempt to minimize narcotics.  If remains an issue for the next  couple of days may consider anti-mu receptor medications for the GI tract.  No indication for reglan at this time.  Will continue to follow.  Patient seen and examined, agree with above note.  I dictated the care and orders written for this patient under my direction.  Rush Farmer, MD [phone]  Rush Farmer, M.D. [facility] Pulmonary/Critical Care Medicine. Pager: [phone]. After hours pager: [phone].

## 2014-12-17 NOTE — Progress Notes (Signed)
Patient ID: Cynthia Rosales, female   DOB: [birth year], 34 y.o.   MRN: [id number] Discussed situation with Dr. Nelda Marseille. Inadvertent placement of triple-lumen catheter and left subclavian artery. No hematoma or bleeding around this. Patient is not on anticoagulation. Feels safest to control blood pressure to normal level and pull the catheter and hold pressure. Would then obtain a chest x-ray do confirm no evidence of blood in the chest. Available if needed for her surgical exploration which would be unlikely.

## 2014-12-17 NOTE — Progress Notes (Signed)
PARENTERAL NUTRITION CONSULT NOTE - INITIAL  Pharmacy Consult for TPN Indication: Prolonged Ileus   Allergies  Allergen Reactions  . Amoxicillin Hives and Rash    Patient Measurements: Height: 5\' 4"  (162.6 cm) (PER PT REPORT) Weight: (!) 338 lb 10 oz (153.6 kg) IBW/kg (Calculated) : 54.7  Vital Signs: Temp: 98.6 F (37 C) (01/18 0800) Temp Source: Oral (01/18 0800) BP: 133/84 mmHg (01/18 0800) Pulse Rate: 146 (01/18 0800) Intake/Output from previous day: 01/17 0701 - 01/18 0700 In: 1360 [P.O.:1320; I.V.:40] Out: 1701 [Urine:1700; Emesis/NG output:1] Intake/Output from this shift:    Labs: No results for input(s): WBC, HGB, HCT, PLT, APTT, INR in the last 72 hours.  No results for input(s): NA, K, CL, CO2, GLUCOSE, BUN, CREATININE, LABCREA, CREAT24HRUR, CALCIUM, MG, PHOS, PROT, ALBUMIN, AST, ALT, ALKPHOS, BILITOT, BILIDIR, IBILI, PREALBUMIN, TRIG, CHOLHDL, CHOL in the last 72 hours. Estimated Creatinine Clearance: 146.1 mL/min (by C-G formula based on Cr of 0.73).    Recent Labs  12/16/14 1721 12/16/14 2200 12/17/14 0719  GLUCAP 238* 178* 230*    Medical History: Past Medical History  Diagnosis Date  . Hypertension   . Asthma   . Type II diabetes mellitus   . History of blood transfusion     "after I had one of my kids"  . Sickle cell disease   . Anemia   . Depression   . Ovarian cyst   . Renal disorder     kidney stones    Medications:  Prescriptions prior to admission  Medication Sig Dispense Refill Last Dose  . hydrOXYzine (VISTARIL) 25 MG capsule Take 25 mg by mouth at bedtime.   12/10/2014 at Unknown time  . insulin aspart (NOVOLOG) 100 UNIT/ML injection Inject 20 Units into the skin 3 (three) times daily.    12/10/2014  . insulin glargine (LANTUS) 100 UNIT/ML injection Inject 50 Units into the skin 2 (two) times daily.    12/10/2014  . losartan (COZAAR) 25 MG tablet Take 25 mg by mouth daily.   12/10/2014 at Unknown time  . metFORMIN (GLUCOPHAGE)  1000 MG tablet Take 1,000 mg by mouth 2 (two) times daily.   12/10/2014  . metoprolol tartrate (LOPRESSOR) 25 MG tablet Take 25 mg by mouth daily.   12/10/2014  . topiramate (TOPAMAX) 25 MG tablet Take 2 tablets (50 mg total) by mouth 2 (two) times daily. 120 tablet 3 12/10/2014 at Unknown time    Insulin Requirements in the past 24 hours:  7 units SSI  Assessment: 31 YOF who was admitted with c/o neck pain with radiation to BUE and loss of bowel and bladder function. MRI showed C4-C5 mass. Taken to OR on 1/14 for C3-C6 laminectomy with resection of spinal cord neoplasm.  GI: Abdominal x-ray on 1/8 showed generalized bowel dilation, suspected ileus or enteritis.  Endo: CBGs elevated 178-248, on SSI Lytes: Na 133, other lytes wnl  Renal: SCr 0.93, NS @ 100 mL/hr  Pulm: RA Cards: : BP mildly elevated, tachy, On Lopressor, PRN labetalol  Hepatobil: No LFTs  Neuro: Dilaudid PRN ID: WBC elevated at 21  Best Practices: Heparin SQ, PPI IV  TPN Access: CVC triple lumen (1/18>>) TPN day#: 0   Current Nutrition:  None   Nutritional Goals:  Per RD recommendations   Plan:  -Start Clinimix E 5/15 at 40 mL/hr + IVFE at 10 mL/hr on M/W/F due to lipid shortage  -Decrease NS infusion to 60 mL/hr to accommodate TPN start  -Add 10 units of regular  insulin in TPN bag due to hyperglycemia  -Daily MVI and TE  -F/u TPN labs  -F/u RD recommendations  -Monitor electrolytes closely  Albertina Parr, PharmD., BCPS Clinical Pharmacist Pager [phone]

## 2014-12-17 NOTE — Progress Notes (Signed)
Patient with significant ileus causing abdominal pain which has improved somewhat with  NG-tube decompression.  She is afebrile. Vitals are currently stable. Urine output adequate. The patient is awake and alert. She is oriented and more appropriate. She has 4 over 5 strength in both upper extremities. Still has some weakness in her right lower extremity but is able to wiggle toes voluntarily. Better than antigravity on the left. wound clean and dry.  Status post cervical laminectomy and resection of intramedullary tumor. Pathology consistent with ependymoma. Overall doing well regarding neurologic function. Patient with significant ileus now being treated with NG tube decompression. Critical care medicine now on board and helping with management.

## 2014-12-17 NOTE — Progress Notes (Signed)
CXR noted post TLC placement.  Position was questionable.  CVP attached to it and an arterial wave form was noted.  Vascular surgery was contacted.  Recommendations are to drop SBP in the 100 mHg.  Will use labetalol.  After SBP is in 100's remove the TLC and RN to hold pressure until no external bleeding is noted.  30 minutes after obtain a CXR.  If no pleural fluid then no complications are noted.  If there is a pleural effusion then will contact Dr. Sherren Mocha early.  Rush Farmer, M.D. [facility] Pulmonary/Critical Care Medicine. Pager: [phone]. After hours pager: [phone].

## 2014-12-17 NOTE — Progress Notes (Signed)
INITIAL NUTRITION ASSESSMENT  DOCUMENTATION CODES Per approved criteria  -Morbid Obesity   INTERVENTION:  TPN dosing per Pharmacy to meet >90% of estimated nutrition needs as able.   NUTRITION DIAGNOSIS: Inadequate oral intake related to inability to eat as evidenced by NPO status.   Goal: Intake to meet >90% of estimated nutrition needs.  Monitor:  TPN tolerance/adequacy, weight trend, labs.  Reason for Assessment: New TPN Consult  34 y.o. female  Admitting Dx: Spinal cord tumor  ASSESSMENT: Patient admitted on 1/11 with neck pain. 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 S/P cervical laminectomy on 1/14.   Since admission, patient has been receiving a regular/CHO-modified diet with PO intake ~50% of meals.  Abdominal xray on 1/18 showed generalized bowel dilation, suspected ileus or enteritis. NGT in place for decompression. Currently NPO.   Plans to start TPN today with Clinimix E 5/15 @ 40 ml/hr.  Lipids (20% IVFE @ 10 ml/hr)to be provided 3 times weekly (MWF) due to lipid shortage.  Will provide 887 kcal and 48 grams protein daily (based on weekly average).    Height: Ht Readings from Last 1 Encounters:  12/13/14 5\' 4"  (1.626 m)    Weight: Wt Readings from Last 1 Encounters:  12/13/14 338 lb 10 oz (153.6 kg)    Ideal Body Weight: 54.5 kg  % Ideal Body Weight: 282%  Wt Readings from Last 10 Encounters:  12/13/14 338 lb 10 oz (153.6 kg)  12/05/14 320 lb (145.151 kg)  11/19/14 343 lb 3.2 oz (155.674 kg)  09/20/14 290 lb (131.543 kg)  04/02/14 310 lb (140.615 kg)  03/27/14 309 lb 15.8 oz (140.61 kg)    Usual Body Weight: 290-310 lb  % Usual Body Weight: ~113%  BMI:  Body mass index is 58.1 kg/(m^2). class 3, extreme/morbid obesity  Estimated Nutritional Needs: Kcal: 2200-2400 Protein: 120-140 gm Fluid: 2.2-2.4 L  Skin: WDL  Diet Order: Diet NPO time specified TPN (CLINIMIX-E) Adult  EDUCATION NEEDS: -Education not  appropriate at this time   Intake/Output Summary (Last 24 hours) at 12/17/14 1520 Last data filed at 12/17/14 1400  Gross per 24 hour  Intake    480 ml  Output   1726 ml  Net  -1246 ml    Last BM: 1/11   Labs:   Recent Labs Lab 12/10/14 2148 12/11/14 0223 12/17/14 1130  NA 136 137 133*  K 3.7 3.8 4.3  CL 105 104 101  CO2 19 27 21   BUN 16 15 29*  CREATININE 0.82 0.73 0.93  CALCIUM 8.9 8.7 8.5  GLUCOSE 149* 116* 248*    CBG (last 3)   Recent Labs  12/16/14 2200 12/17/14 0719 12/17/14 1150  GLUCAP 178* 230* 241*    Scheduled Meds: . dexamethasone  4 mg Intravenous 4 times per day  . heparin subcutaneous  5,000 Units Subcutaneous 3 times per day  . insulin aspart  0-20 Units Subcutaneous 6 times per day  . metoprolol  5 mg Intravenous 3 times per day  . pantoprazole (PROTONIX) IV  40 mg Intravenous Q24H  . sodium chloride  10-40 mL Intracatheter Q12H    Continuous Infusions: . sodium chloride    . sodium chloride    . Marland KitchenTPN (CLINIMIX-E) Adult     And  . fat emulsion      Past Medical History  Diagnosis Date  . Hypertension   . Asthma   . Type II diabetes mellitus   . History of blood transfusion     "  after I had one of my kids"  . Sickle cell disease   . Anemia   . Depression   . Ovarian cyst   . Renal disorder     kidney stones    Past Surgical History  Procedure Laterality Date  . Cesarean section  2000; 2007; 2011  . Dilation and curettage of uterus    . Appendectomy  2013  . Reduction mammaplasty Bilateral 1998    Molli Barrows, RD, LDN, [place] Pager [phone] After Hours Pager [phone]

## 2014-12-17 NOTE — Progress Notes (Signed)
PCCM Interval Progress Note  Inadvertent placement of CVL in left subclavian artery earlier this afternoon.    Labetalol gtt started at 1700 to achieve SBP goal of low 100's, has been running since then.  SBP now 100.  Line pulled and direct pressure applied for 10 minutes.  No external bleeding noted.  Dressing applied and RN asked to continue to monitor site closely for signs of bleeding.  CXR ordered for 1130 (30 minutes after line pulled).  Will follow up.   Cynthia Rosales, [facility] Pulmonary & Critical Care Medicine Pgr: [phone]  or [phone] 12/17/2014, 10:32 PM

## 2014-12-18 ENCOUNTER — Inpatient Hospital Stay (HOSPITAL_COMMUNITY): Payer: Medicaid Other

## 2014-12-18 LAB — DIFFERENTIAL
Basophils Absolute: 0 10*3/uL (ref 0.0–0.1)
Basophils Relative: 0 % (ref 0–1)
Eosinophils Absolute: 0 10*3/uL (ref 0.0–0.7)
Eosinophils Relative: 0 % (ref 0–5)
Lymphocytes Relative: 10 % — ABNORMAL LOW (ref 12–46)
Lymphs Abs: 1.6 10*3/uL (ref 0.7–4.0)
Monocytes Absolute: 1.1 10*3/uL — ABNORMAL HIGH (ref 0.1–1.0)
Monocytes Relative: 7 % (ref 3–12)
Neutro Abs: 12.9 10*3/uL — ABNORMAL HIGH (ref 1.7–7.7)
Neutrophils Relative %: 83 % — ABNORMAL HIGH (ref 43–77)

## 2014-12-18 LAB — COMPREHENSIVE METABOLIC PANEL
ALT: 10 U/L (ref 0–35)
AST: 17 U/L (ref 0–37)
Albumin: 2.5 g/dL — ABNORMAL LOW (ref 3.5–5.2)
Alkaline Phosphatase: 73 U/L (ref 39–117)
Anion gap: 12 (ref 5–15)
BUN: 36 mg/dL — ABNORMAL HIGH (ref 6–23)
CO2: 17 mmol/L — ABNORMAL LOW (ref 19–32)
Calcium: 8.4 mg/dL (ref 8.4–10.5)
Chloride: 104 mEq/L (ref 96–112)
Creatinine, Ser: 1.02 mg/dL (ref 0.50–1.10)
GFR calc Af Amer: 82 mL/min — ABNORMAL LOW (ref >90)
GFR calc non Af Amer: 70 mL/min — ABNORMAL LOW (ref >90)
Glucose, Bld: 275 mg/dL — ABNORMAL HIGH (ref 70–99)
Potassium: 4.9 mmol/L (ref 3.5–5.1)
Sodium: 133 mmol/L — ABNORMAL LOW (ref 135–145)
Total Bilirubin: 0.4 mg/dL (ref 0.3–1.2)
Total Protein: 7.1 g/dL (ref 6.0–8.3)

## 2014-12-18 LAB — PHOSPHORUS: Phosphorus: 2.8 mg/dL (ref 2.3–4.6)

## 2014-12-18 LAB — CBC
HCT: 32.1 % — ABNORMAL LOW (ref 36.0–46.0)
Hemoglobin: 10.5 g/dL — ABNORMAL LOW (ref 12.0–15.0)
MCH: 22.3 pg — ABNORMAL LOW (ref 26.0–34.0)
MCHC: 32.7 g/dL (ref 30.0–36.0)
MCV: 68.2 fL — ABNORMAL LOW (ref 78.0–100.0)
Platelets: 491 10*3/uL — ABNORMAL HIGH (ref 150–400)
RBC: 4.71 MIL/uL (ref 3.87–5.11)
RDW: 16.6 % — ABNORMAL HIGH (ref 11.5–15.5)
WBC: 15.6 10*3/uL — ABNORMAL HIGH (ref 4.0–10.5)

## 2014-12-18 LAB — GLUCOSE, CAPILLARY
Glucose-Capillary: 115 mg/dL — ABNORMAL HIGH (ref 70–99)
Glucose-Capillary: 132 mg/dL — ABNORMAL HIGH (ref 70–99)
Glucose-Capillary: 192 mg/dL — ABNORMAL HIGH (ref 70–99)
Glucose-Capillary: 193 mg/dL — ABNORMAL HIGH (ref 70–99)
Glucose-Capillary: 232 mg/dL — ABNORMAL HIGH (ref 70–99)
Glucose-Capillary: 289 mg/dL — ABNORMAL HIGH (ref 70–99)

## 2014-12-18 LAB — PREALBUMIN: Prealbumin: 18.8 mg/dL (ref 17.0–34.0)

## 2014-12-18 LAB — TRIGLYCERIDES: Triglycerides: 138 mg/dL (ref <150)

## 2014-12-18 LAB — MAGNESIUM: Magnesium: 2.6 mg/dL — ABNORMAL HIGH (ref 1.5–2.5)

## 2014-12-18 MED ORDER — NALOXEGOL OXALATE 25 MG PO TABS
25.0000 mg | ORAL_TABLET | Freq: Every day | ORAL | Status: DC
  Administered 2014-12-18 – 2014-12-20 (×3): 25 mg via ORAL
  Filled 2014-12-18 (×4): qty 1

## 2014-12-18 MED ORDER — METOCLOPRAMIDE HCL 5 MG/ML IJ SOLN
5.0000 mg | Freq: Three times a day (TID) | INTRAMUSCULAR | Status: DC
  Administered 2014-12-18 – 2014-12-19 (×3): 5 mg via INTRAVENOUS
  Filled 2014-12-18 (×6): qty 1

## 2014-12-18 MED ORDER — TRACE MINERALS CR-CU-F-FE-I-MN-MO-SE-ZN IV SOLN
INTRAVENOUS | Status: AC
  Administered 2014-12-18: 17:00:00 via INTRAVENOUS
  Filled 2014-12-18: qty 1440

## 2014-12-18 MED ORDER — LIDOCAINE HCL 1 % IJ SOLN
INTRAMUSCULAR | Status: AC
  Filled 2014-12-18: qty 20

## 2014-12-18 MED ORDER — SODIUM CHLORIDE 0.9 % IV SOLN: INTRAVENOUS | Status: AC

## 2014-12-18 NOTE — Progress Notes (Signed)
The patient looks a little better today. Pain better controlled. Patient complains of neck pain and abdominal pain. No flatus as of yet.  Afebrile with stable vital signs. Urine output adequate. Ng  Output low. Awake and alert. Oriented and appropriate. Motor and sensory function of both upper extremities stable. 4 over 5 strength bilaterally. Antigravity strength left lower extremity. Wiggles toes on right. Sensory deficit more dense on right lower extremity. Wound clean and dry. Abdomen remains tense with hypoactive bowel sounds.  Major problem currently continues to be that of a marked paralytic ileus. Continue nasogastric tube and IV fluids and nutrition. Work on mobilizing. Progressing well with regard to her tumor.

## 2014-12-18 NOTE — Progress Notes (Addendum)
PULMONARY / CRITICAL CARE MEDICINE   Name: Cynthia Rosales MRN: [id number] DOB: 12/20/1980    ADMISSION DATE:  12/10/2014 CONSULTATION DATE:  12/17/2014  REFERRING MD :  Dr. Annette Stable (Neruosurgery)  CHIEF COMPLAINT:  C4-C5 mass.  INITIAL PRESENTATION: 34 year old female with who was admitted from neurosurgical office 1/11. She c/o neck pain with radiation to BUE and loss of bowel/bladder function. MRI showed C4-C5 mass. Taken to OR 1/14.   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 This lesion enhances on today's post-contrast sequences, consistent with a neoplastic process. 1/18 Abdominal Xray > Generalized bowel dilation, suspected ileus or enteritis.  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, microdissection  SUBJECTIVE: C/O abdominal pain and being very thirsty  VITAL SIGNS: Temp:  [97.6 F (36.4 C)-99.1 F (37.3 C)] 98.6 F (37 C) (01/19 0720) Pulse Rate:  [91-143] 94 (01/19 0800) Resp:  [14-31] 17 (01/19 0800) BP: (96-150)/(59-95) 123/74 mmHg (01/19 0800) SpO2:  [94 %-98 %] 96 % (01/19 0800) Weight:  [155.4 kg (342 lb 9.5 oz)] 155.4 kg (342 lb 9.5 oz) (01/19 0500)   HEMODYNAMICS:   VENTILATOR SETTINGS:   INTAKE / OUTPUT:  Intake/Output Summary (Last 24 hours) at 12/18/14 0943 Last data filed at 12/18/14 0800  Gross per 24 hour  Intake 1148.13 ml  Output   1800 ml  Net -651.87 ml   PHYSICAL EXAMINATION: General:  Morbidly obese female in NAD Neuro:  Alert, oriented. HEENT:  NA/AT, PERRL, no JVD noted Cardiovascular:  Tachy, regular, no MRG noted Lungs:  Clear, distant bilateral breath sounds. Normal WOB Abdomen:  Obese, soft, non-tender. Hypoactive bowel sounds.  Musculoskeletal:  Decreased sensation to RUE, LLE, no sensation to distal RLE. Decreased ROM to all  extremities. Worst RLE. 2/5.  Skin:  Grossly intact  LABS:  CBC  Recent Labs Lab 12/17/14 1130 12/18/14 0211  WBC 21.0* 15.6*  HGB 11.3* 10.5*  HCT 33.8* 32.1*  PLT 524* 491*   Coag's No results for input(s): APTT, INR in the last 168 hours. BMET  Recent Labs Lab 12/17/14 1130 12/18/14 0211  NA 133* 133*  K 4.3 4.9  CL 101 104  CO2 21 17*  BUN 29* 36*  CREATININE 0.93 1.02  GLUCOSE 248* 275*   Electrolytes  Recent Labs Lab 12/17/14 1130 12/18/14 0211  CALCIUM 8.5 8.4  MG  --  2.6*  PHOS  --  2.8   Sepsis Markers No results for input(s): LATICACIDVEN, PROCALCITON, O2SATVEN in the last 168 hours. ABG No results for input(s): PHART, PCO2ART, PO2ART in the last 168 hours. Liver Enzymes  Recent Labs Lab 12/18/14 0211  AST 17  ALT 10  ALKPHOS 73  BILITOT 0.4  ALBUMIN 2.5*   Cardiac Enzymes No results for input(s): TROPONINI, PROBNP in the last 168 hours. Glucose  Recent Labs Lab 12/17/14 1150 12/17/14 1615 12/17/14 2025 12/18/14 0023 12/18/14 0419 12/18/14 0723  GLUCAP 241* 223* 284* 289* 232* 193*    Imaging Dg Chest Port 1 View  12/17/2014   CLINICAL DATA:  CVL inadvertently placed in lt subclavian artery earlier this PM. Line now pulled, f/u CXR to assess for bleeding.  EXAM: PORTABLE CHEST - 1 VIEW  COMPARISON:  12/17/2014 at 1434 hr  FINDINGS: No evidence of pulmonary parenchymal hemorrhage or of a hemothorax.  No mediastinal hemorrhage or widening.  No pneumothorax is seen.  Lung volumes are low. There is medial lung base opacity that is most likely atelectasis. No convincing pneumonia no pulmonary edema.  Nasogastric tube is stable passing below the diaphragm into stomach.  IMPRESSION: 1. No evidence of hemorrhage or complication following removal of the catheter inadvertently placed into the left subclavian artery.   Electronically Signed   By: Lajean Manes M.D.   On: 12/17/2014 23:30   Dg Chest Port 1 View  12/17/2014   CLINICAL DATA:   Initial encounter for PICC line placement.  EXAM: PORTABLE CHEST - 1 VIEW  COMPARISON:  12/10/2014  FINDINGS: Nasogastric tube terminates at the body of the stomach. Low volume portable radiograph. Numerous leads and wires project over the chest. A catheter projects over the expected course of the left subclavian vein and over the left side of the mediastinum.  Normal heart size.  Lungs grossly clear.  No pneumothorax.  IMPRESSION: No definite PICC line is identified. There is a catheter projecting over the left side of the chest which presuming is internal the patient does not take the expected course of a venous catheter. Considerations include placement within a double SVC or arterial placement. Recommend clinical correlation and possibly line repositioning.  These results will be called to the ordering clinician or representative by the Radiologist Assistant, and communication documented in the PACS or zVision Dashboard.   Electronically Signed   By: Abigail Miyamoto M.D.   On: 12/17/2014 15:10   Dg Abd Portable 1v  12/17/2014   CLINICAL DATA:  NG tube placement  EXAM: PORTABLE ABDOMEN - 1 VIEW  COMPARISON:  12/17/2014  FINDINGS: Persistent gaseous bowel distention suspicious for ileus or enteritis. NG tube in place with tip in proximal stomach.  IMPRESSION: NG tube in place with tip in proximal stomach.   Electronically Signed   By: Lahoma Crocker M.D.   On: 12/17/2014 10:54   Dg Abd Portable 1v  12/17/2014   CLINICAL DATA:  Abdominal pain and nausea  EXAM: PORTABLE ABDOMEN - 1 VIEW  COMPARISON:  CT abdomen and pelvis June 19, 2014  FINDINGS: There is moderate generalized bowel dilatation. Air is seen in the rectum. No free air is seen on this supine examination. There are no abnormal calcifications.  IMPRESSION: Generalized bowel dilatation. Suspect ileus or enteritis. Obstruction possible but felt to be less likely.   Electronically Signed   By: Lowella Grip M.D.   On: 12/17/2014 08:52     ASSESSMENT /  PLAN:  PULMONARY A: H/o Asthma Concern for OHS  P:   PRN Xopenex Monitor  CARDIOVASCULAR A:  Sinus tachycardia H/o HTN  P:  Hold PO antihypertensives in setting of ileus IV metoprolol 5mg  TID PRN labetalol  Change IVF to D5NS 100 ml/hr.  RENAL A:   No acute issues  P:   Repeat Bmet Replace electrolytes as indicated IVF NS 100 ml/hr.  GASTROINTESTINAL A:   Ileus, suspect due to pain medications Nutrition  P:   NPO NGT to intermittent suction SUP: IV protonix Place TLC in IR and start TPN   HEMATOLOGIC A:   Sickle cell disease  P:  Repeat CBC Heparin for DVT ppx Monitor  INFECTIOUS A:   No evidence for infection  P:   Recheck CBC Monitor clinically  ENDOCRINE A:   DM 2  P:   Hold lantus Hold metformin CBG monitoring and SSI to q 4 hours while NPO  NEUROLOGIC A:  C4-C5 mass s/p resection with weakness and sensation loss to extremitites  P:   RASS goal: 0 Neurosurgery primary Decadron Holding PO flexeril, Topamax, dilaudid Neruochecks  FAMILY  - Updates: patient updated bedside.  - Inter-disciplinary family meet or Palliative Care meeting due by:  1/19 is ICU day 7  Maintain NPO for now, maintain IVF, if worsens will involve general surgery.  WBC is dropping for now.  Monitor for signs of perforation.  Will give reglan today.  Add naloxagol for bowel mobility.  Rush Farmer, M.D. [facility] Pulmonary/Critical Care Medicine. Pager: [phone]. After hours pager: [phone].

## 2014-12-18 NOTE — Progress Notes (Signed)
PT Cancellation Note  Patient Details Name: Cynthia Rosales MRN: [id number] DOB: 08/12/1981   Cancelled Treatment:    Reason Eval/Treat Not Completed: Other (comment) pt upset about possibility of having another line placed and stating this was too much all at once.  PT will hold at this time and f/u another time.     Javell Blackburn, Thornton Papas 12/18/2014, 12:31 PM

## 2014-12-18 NOTE — Procedures (Signed)
Successful placement of right IJ approach 14 cm triple lumen PICC line with tip at the superior caval-atrial junction.  The PICC line is ready for immediate use.

## 2014-12-18 NOTE — Progress Notes (Signed)
Continued eMD monitoring  Repeat cxr this aM - stable   Recent Labs Lab 12/17/14 1130 12/18/14 0211  HGB 11.3* 10.5*    Also stable hgb  PLAN Continue to monitor -- no eviodence of blood loss based on data   Dr. Brand Males, M.D., [facility].C.P Pulmonary and Critical Care Medicine Staff Physician [place] [facility] Pager: [phone], If no answer or between  15:00h - 7:00h: call [phone]  12/18/2014 6:11 AM

## 2014-12-18 NOTE — Progress Notes (Signed)
PARENTERAL NUTRITION CONSULT NOTE - INITIAL  Pharmacy Consult for TPN Indication: Prolonged Ileus   Allergies  Allergen Reactions  . Amoxicillin Hives and Rash    Patient Measurements: Height: 5\' 4"  (162.6 cm) (PER PT REPORT) Weight: (!) 342 lb 9.5 oz (155.4 kg) IBW/kg (Calculated) : 54.7  Vital Signs: Temp: 98.6 F (37 C) (01/19 0720) Temp Source: Oral (01/19 0720) BP: 121/78 mmHg (01/19 0720) Pulse Rate: 93 (01/19 0720) Intake/Output from previous day: 01/18 0701 - 01/19 0700 In: 1088.1 [I.V.:1030.6; IV Piggyback:57.5] Out: 1675 [Urine:1475; Emesis/NG output:200] Intake/Output from this shift: Total I/O In: -  Out: 125 [Urine:125]  Labs:  Recent Labs  12/17/14 1130 12/18/14 0211  WBC 21.0* 15.6*  HGB 11.3* 10.5*  HCT 33.8* 32.1*  PLT 524* 491*     Recent Labs  12/17/14 1130 12/18/14 0211  NA 133* 133*  K 4.3 4.9  CL 101 104  CO2 21 17*  GLUCOSE 248* 275*  BUN 29* 36*  CREATININE 0.93 1.02  CALCIUM 8.5 8.4  MG  --  2.6*  PHOS  --  2.8  PROT  --  7.1  ALBUMIN  --  2.5*  AST  --  17  ALT  --  10  ALKPHOS  --  73  BILITOT  --  0.4  TRIG  --  138   Estimated Creatinine Clearance: 115.5 mL/min (by C-G formula based on Cr of 1.02).    Recent Labs  12/17/14 2025 12/18/14 0023 12/18/14 0419  GLUCAP 284* 289* 232*    Medical History: Past Medical History  Diagnosis Date  . Hypertension   . Asthma   . Type II diabetes mellitus   . History of blood transfusion     "after I had one of my kids"  . Sickle cell disease   . Anemia   . Depression   . Ovarian cyst   . Renal disorder     kidney stones    Medications:  Prescriptions prior to admission  Medication Sig Dispense Refill Last Dose  . hydrOXYzine (VISTARIL) 25 MG capsule Take 25 mg by mouth at bedtime.   12/10/2014 at Unknown time  . insulin aspart (NOVOLOG) 100 UNIT/ML injection Inject 20 Units into the skin 3 (three) times daily.    12/10/2014  . insulin glargine (LANTUS) 100  UNIT/ML injection Inject 50 Units into the skin 2 (two) times daily.    12/10/2014  . losartan (COZAAR) 25 MG tablet Take 25 mg by mouth daily.   12/10/2014 at Unknown time  . metFORMIN (GLUCOPHAGE) 1000 MG tablet Take 1,000 mg by mouth 2 (two) times daily.   12/10/2014  . metoprolol tartrate (LOPRESSOR) 25 MG tablet Take 25 mg by mouth daily.   12/10/2014  . topiramate (TOPAMAX) 25 MG tablet Take 2 tablets (50 mg total) by mouth 2 (two) times daily. 120 tablet 3 12/10/2014 at Unknown time    Insulin Requirements in the past 24 hours:  33 units SSI  Assessment: 31 YOF who was admitted with c/o neck pain with radiation to BUE and loss of bowel and bladder function. MRI showed C4-C5 mass. Taken to OR on 1/14 for C3-C6 laminectomy with resection of spinal cord neoplasm.  GI: Abdominal x-ray on 1/8 showed generalized bowel dilation, suspected ileus or enteritis. Pt with abdominal pain which has improved with NG-tube decompression  Endo: CBGs elevated 232-289, on resistant SSI, On decadron four times daily  Lytes: Na 133, Mg 2.6 Renal: SCr stable 1.02, NS @ 60  mL/hr, UOP 0.4 mL/kg/hr  Pulm: RA Cards: : VSS, On labetalol infusion, Lopressor, TG 138 Hepatobil: LFTs wnl   Neuro: Dilaudid PRN ID: WBC down to 15.6 Best Practices: Heparin SQ, PPI IV  TPN Access: CVC triple lumen (1/18>>) TPN day#: 1  Current Nutrition:  Clinimix E 5/15 at 40 mL/hr + IVFE at 10 mL/hr   Nutritional Goals:  2200-2400 Kcal; 120-140 gm Protein   Plan:  -Switch to Clinimix E 5/20 and increase to 60 mL/hr + IVFE at 10 mL/hr on M/W/F due to lipid shortage. Advance as tolerated  -Decrease NS infusion to 40 mL/hr to accommodate TPN start  -Increase regular insulin in TPN bag to 20 units due to hyperglycemia  -Daily MVI and TE  -F/u AM labs  -Monitor electrolytes closely  Albertina Parr, PharmD., BCPS Clinical Pharmacist Pager [phone]

## 2014-12-18 NOTE — Progress Notes (Signed)
eMD rounding note  CXR 22.45 post removal of subclavian line - looks ok BP 101 sbp on labetalol gtt - that will end next 1-2h Looks stable  Plan Check cxr at 4am  Dr. Brand Males, M.D., [facility].C.P Pulmonary and Critical Care Medicine Staff Physician [place] [facility] Pager: [phone], If no answer or between  15:00h - 7:00h: call [phone]  12/18/2014 12:11 AM

## 2014-12-19 ENCOUNTER — Inpatient Hospital Stay (HOSPITAL_COMMUNITY): Payer: Medicaid Other

## 2014-12-19 DIAGNOSIS — K56609 Unspecified intestinal obstruction, unspecified as to partial versus complete obstruction: Secondary | ICD-10-CM | POA: Insufficient documentation

## 2014-12-19 DIAGNOSIS — Z452 Encounter for adjustment and management of vascular access device: Secondary | ICD-10-CM

## 2014-12-19 DIAGNOSIS — K567 Ileus, unspecified: Secondary | ICD-10-CM | POA: Insufficient documentation

## 2014-12-19 DIAGNOSIS — J9811 Atelectasis: Secondary | ICD-10-CM | POA: Insufficient documentation

## 2014-12-19 DIAGNOSIS — K560 Paralytic ileus: Secondary | ICD-10-CM

## 2014-12-19 DIAGNOSIS — Z4659 Encounter for fitting and adjustment of other gastrointestinal appliance and device: Secondary | ICD-10-CM | POA: Insufficient documentation

## 2014-12-19 LAB — GLUCOSE, CAPILLARY
Glucose-Capillary: 134 mg/dL — ABNORMAL HIGH (ref 70–99)
Glucose-Capillary: 183 mg/dL — ABNORMAL HIGH (ref 70–99)
Glucose-Capillary: 204 mg/dL — ABNORMAL HIGH (ref 70–99)
Glucose-Capillary: 229 mg/dL — ABNORMAL HIGH (ref 70–99)
Glucose-Capillary: 269 mg/dL — ABNORMAL HIGH (ref 70–99)
Glucose-Capillary: 282 mg/dL — ABNORMAL HIGH (ref 70–99)
Glucose-Capillary: 297 mg/dL — ABNORMAL HIGH (ref 70–99)

## 2014-12-19 LAB — CBC
HCT: 30.5 % — ABNORMAL LOW (ref 36.0–46.0)
Hemoglobin: 10.1 g/dL — ABNORMAL LOW (ref 12.0–15.0)
MCH: 22.6 pg — ABNORMAL LOW (ref 26.0–34.0)
MCHC: 33.1 g/dL (ref 30.0–36.0)
MCV: 68.4 fL — ABNORMAL LOW (ref 78.0–100.0)
Platelets: 528 10*3/uL — ABNORMAL HIGH (ref 150–400)
RBC: 4.46 MIL/uL (ref 3.87–5.11)
RDW: 16.8 % — ABNORMAL HIGH (ref 11.5–15.5)
WBC: 15.3 10*3/uL — ABNORMAL HIGH (ref 4.0–10.5)

## 2014-12-19 LAB — BASIC METABOLIC PANEL
Anion gap: 6 (ref 5–15)
BUN: 31 mg/dL — ABNORMAL HIGH (ref 6–23)
CO2: 22 mmol/L (ref 19–32)
Calcium: 8.3 mg/dL — ABNORMAL LOW (ref 8.4–10.5)
Chloride: 106 mEq/L (ref 96–112)
Creatinine, Ser: 0.86 mg/dL (ref 0.50–1.10)
GFR calc Af Amer: >90 mL/min (ref >90)
GFR calc non Af Amer: 87 mL/min — ABNORMAL LOW (ref >90)
Glucose, Bld: 306 mg/dL — ABNORMAL HIGH (ref 70–99)
Potassium: 4.5 mmol/L (ref 3.5–5.1)
Sodium: 134 mmol/L — ABNORMAL LOW (ref 135–145)

## 2014-12-19 LAB — PHOSPHORUS: Phosphorus: 2.9 mg/dL (ref 2.3–4.6)

## 2014-12-19 LAB — MAGNESIUM: Magnesium: 2.9 mg/dL — ABNORMAL HIGH (ref 1.5–2.5)

## 2014-12-19 MED ORDER — FAT EMULSION 20 % IV EMUL
240.0000 mL | INTRAVENOUS | Status: AC
  Administered 2014-12-19: 240 mL via INTRAVENOUS
  Filled 2014-12-19: qty 250

## 2014-12-19 MED ORDER — CETYLPYRIDINIUM CHLORIDE 0.05 % MT LIQD
7.0000 mL | Freq: Two times a day (BID) | OROMUCOSAL | Status: DC
  Administered 2014-12-20 (×2): 7 mL via OROMUCOSAL

## 2014-12-19 MED ORDER — M.V.I. ADULT IV INJ
INTRAVENOUS | Status: AC
  Administered 2014-12-19: 18:00:00 via INTRAVENOUS
  Filled 2014-12-19: qty 1992

## 2014-12-19 MED ORDER — METOCLOPRAMIDE HCL 5 MG/ML IJ SOLN: 10.0000 mg | Freq: Three times a day (TID) | INTRAMUSCULAR | Status: DC

## 2014-12-19 MED ORDER — DEXAMETHASONE SODIUM PHOSPHATE 4 MG/ML IJ SOLN
2.0000 mg | Freq: Two times a day (BID) | INTRAMUSCULAR | Status: DC
  Administered 2014-12-19 – 2014-12-21 (×4): 2 mg via INTRAVENOUS
  Filled 2014-12-19 (×4): qty 0.5

## 2014-12-19 MED ORDER — BISACODYL 10 MG RE SUPP
10.0000 mg | Freq: Every day | RECTAL | Status: DC | PRN
  Administered 2014-12-19: 10 mg via RECTAL
  Filled 2014-12-19: qty 1

## 2014-12-19 MED ORDER — METOCLOPRAMIDE HCL 5 MG/ML IJ SOLN
10.0000 mg | Freq: Four times a day (QID) | INTRAMUSCULAR | Status: DC
  Administered 2014-12-19 – 2014-12-22 (×12): 10 mg via INTRAVENOUS
  Filled 2014-12-19 (×17): qty 2

## 2014-12-19 MED ORDER — CHLORHEXIDINE GLUCONATE 0.12 % MT SOLN
15.0000 mL | Freq: Two times a day (BID) | OROMUCOSAL | Status: DC
  Administered 2014-12-20 – 2014-12-24 (×8): 15 mL via OROMUCOSAL
  Filled 2014-12-19 (×9): qty 15

## 2014-12-19 MED ORDER — SODIUM CHLORIDE 0.9 % IV SOLN
INTRAVENOUS | Status: AC
  Administered 2014-12-19: 13:00:00 via INTRAVENOUS

## 2014-12-19 NOTE — Progress Notes (Signed)
Rehab admissions - Noted ongoing ileus.  Not ready for inpatient rehab.  Will follow progress.  Call me for questions.  #[phone]

## 2014-12-19 NOTE — Progress Notes (Signed)
PULMONARY / CRITICAL CARE MEDICINE   Name: Cynthia Rosales MRN: [id number] DOB: [birth year]    ADMISSION DATE:  12/10/2014 CONSULTATION DATE:  12/17/2014  REFERRING MD :  Dr. Jordan Likes (Neruosurgery)  CHIEF COMPLAINT:  C4-C5 mass.  INITIAL PRESENTATION: 34 year old female with who was admitted from neurosurgical office 1/11. She c/o neck pain with radiation to BUE and loss of bowel/bladder function. MRI showed C4-C5 mass. Taken to OR 1/14.   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 This lesion enhances on today's post-contrast sequences, consistent with a neoplastic process. 1/18 Abdominal Xray > Generalized bowel dilation, suspected ileus or enteritis.  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, microdissection  SUBJECTIVE: No BM  VITAL SIGNS: Temp:  [97.6 F (36.4 C)-99.5 F (37.5 C)] 98 F (36.7 C) (01/20 0800) Pulse Rate:  [95-115] 115 (01/20 1000) Resp:  [15-27] 22 (01/20 1000) BP: (103-164)/(77-116) 162/93 mmHg (01/20 0800) SpO2:  [94 %-100 %] 99 % (01/20 1000) Weight:  [156.1 kg (344 lb 2.2 oz)] 156.1 kg (344 lb 2.2 oz) (01/20 0500)   HEMODYNAMICS:   VENTILATOR SETTINGS:   INTAKE / OUTPUT:  Intake/Output Summary (Last 24 hours) at 12/19/14 1043 Last data filed at 12/19/14 1000  Gross per 24 hour  Intake 2351.17 ml  Output   1150 ml  Net 1201.17 ml   PHYSICAL EXAMINATION: General:  Morbidly obese female in NAD Neuro:  Alert, oriented. HEENT:  jvd remains low Cardiovascular:  s1 s2 Tachy, regular Lungs:  Clear, distant bilateral breath sounds. Normal WOB Abdomen:  Obese, soft, non-tender. Hypoactive bowel sounds but increased Musculoskeletal:  Decreased sensation to RUE, LLE, some sensation distal RLE. Decreased ROM to all extremities. Worst RLE. 2/5.  Skin:  Grossly  intact  LABS:  CBC  Recent Labs Lab 12/17/14 1130 12/18/14 0211 12/19/14 0510  WBC 21.0* 15.6* 15.3*  HGB 11.3* 10.5* 10.1*  HCT 33.8* 32.1* 30.5*  PLT 524* 491* 528*   Coag's No results for input(s): APTT, INR in the last 168 hours. BMET  Recent Labs Lab 12/17/14 1130 12/18/14 0211 12/19/14 0510  NA 133* 133* 134*  K 4.3 4.9 4.5  CL 101 104 106  CO2 21 17* 22  BUN 29* 36* 31*  CREATININE 0.93 1.02 0.86  GLUCOSE 248* 275* 306*   Electrolytes  Recent Labs Lab 12/17/14 1130 12/18/14 0211 12/19/14 0510  CALCIUM 8.5 8.4 8.3*  MG  --  2.6* 2.9*  PHOS  --  2.8 2.9   Sepsis Markers No results for input(s): LATICACIDVEN, PROCALCITON, O2SATVEN in the last 168 hours. ABG No results for input(s): PHART, PCO2ART, PO2ART in the last 168 hours. Liver Enzymes  Recent Labs Lab 12/18/14 0211  AST 17  ALT 10  ALKPHOS 73  BILITOT 0.4  ALBUMIN 2.5*   Cardiac Enzymes No results for input(s): TROPONINI, PROBNP in the last 168 hours. Glucose  Recent Labs Lab 12/18/14 0723 12/18/14 1127 12/18/14 1630 12/18/14 2033 12/19/14 0006 12/19/14 0348  GLUCAP 193* 132* 115* 192* 204* 282*    Imaging Ir Fluoro Guide Cv Line Right  12/18/2014   INDICATION: Poor venous access. In need of intravenous access for blood draws and medication administration.  EXAM: ULTRASOUND AND FLUOROSCOPIC GUIDED JUGULAR APPROACH PICC LINE INSERTION  MEDICATIONS: None.  CONTRAST:  None  FLUOROSCOPY  TIME:  24 seconds (31.5 mGy)  COMPLICATIONS: None immediate  TECHNIQUE: The procedure, risks, benefits, and alternatives were explained to the patient and informed written consent was obtained. A timeout was performed prior to the initiation of the procedure.  The right neck and upper chest were prepped with chlorhexidine in a sterile fashion, and a sterile drape was applied covering the operative field. Maximum barrier sterile technique with sterile gowns and gloves were used for the procedure. A  timeout was performed prior to the initiation of the procedure. Local anesthesia was provided with 1% lidocaine.  Under direct ultrasound guidance, the right internal jugular vein was accessed with a micropuncture kit after the overlying soft tissues were anesthetized with 1% lidocaine. An ultrasound image was saved for documentation purposes. A guidewire was advanced to the level of the superior caval-atrial junction for measurement purposes and the PICC line was cut to length. A peel-away sheath was placed and a 13 cm, 5 French, triple lumen was inserted to level of the superior caval-atrial junction. A post procedure spot fluoroscopic was obtained. The catheter easily aspirated and flushed and was sutured in place. A dressing was placed. The patient tolerated the procedure well without immediate post procedural complication.  FINDINGS: After catheter placement, the tip lies within the superior cavoatrial junction. The catheter aspirates and flushes normally and is ready for immediate use.  IMPRESSION: Successful ultrasound and fluoroscopic guided placement of a right internal jugular vein approach, 13 cm, 5 French, triple lumen PICC with tip at the superior caval-atrial junction. The PICC line is ready for immediate use.   Electronically Signed   By: Sandi Mariscal M.D.   On: 12/18/2014 13:35   Ir US Guide Vasc Access Right  12/18/2014   INDICATION: Poor venous access. In need of intravenous access for blood draws and medication administration.  EXAM: ULTRASOUND AND FLUOROSCOPIC GUIDED JUGULAR APPROACH PICC LINE INSERTION  MEDICATIONS: None.  CONTRAST:  None  FLUOROSCOPY TIME:  24 seconds (40.0 mGy)  COMPLICATIONS: None immediate  TECHNIQUE: The procedure, risks, benefits, and alternatives were explained to the patient and informed written consent was obtained. A timeout was performed prior to the initiation of the procedure.  The right neck and upper chest were prepped with chlorhexidine in a sterile fashion,  and a sterile drape was applied covering the operative field. Maximum barrier sterile technique with sterile gowns and gloves were used for the procedure. A timeout was performed prior to the initiation of the procedure. Local anesthesia was provided with 1% lidocaine.  Under direct ultrasound guidance, the right internal jugular vein was accessed with a micropuncture kit after the overlying soft tissues were anesthetized with 1% lidocaine. An ultrasound image was saved for documentation purposes. A guidewire was advanced to the level of the superior caval-atrial junction for measurement purposes and the PICC line was cut to length. A peel-away sheath was placed and a 13 cm, 5 French, triple lumen was inserted to level of the superior caval-atrial junction. A post procedure spot fluoroscopic was obtained. The catheter easily aspirated and flushed and was sutured in place. A dressing was placed. The patient tolerated the procedure well without immediate post procedural complication.  FINDINGS: After catheter placement, the tip lies within the superior cavoatrial junction. The catheter aspirates and flushes normally and is ready for immediate use.  IMPRESSION: Successful ultrasound and fluoroscopic guided placement of a right internal jugular vein approach, 13 cm, 5 French, triple lumen PICC with tip at the superior caval-atrial junction. The  PICC line is ready for immediate use.   Electronically Signed   By: Sandi Mariscal M.D.   On: 12/18/2014 13:35   Dg Chest Port 1 View  12/18/2014   CLINICAL DATA:  Hypertension.  EXAM: PORTABLE CHEST - 1 VIEW  COMPARISON:  None.  FINDINGS: NG tube noted in stable position. Stable cardiomegaly. Heart size normal. Basilar atelectasis and/or mild infiltrates noted. No pleural effusion or pneumothorax.  IMPRESSION: 1. NG tube in stable position. 2. Bibasilar atelectasis and/or mild infiltrates. 3. Stable cardiomegaly.   Electronically Signed   By: Marcello Moores  Register   On: 12/18/2014  07:55     ASSESSMENT / PLAN:  PULMONARY A: H/o Asthma Concern for OHS ATX P:   PRN Xopenex Monitor pcxr IS important, will educate Treating ileus contribution Upright   CARDIOVASCULAR A:  Sinus tachycardia H/o HTN  P:  Hold PO antihypertensives in setting of ileus IV metoprolol $RemoveBefor'5mg'BdmaAxEMJctt$  TID PRN labetalol  Change IVF pos balance  RENAL A:   Ileus  P:   Repeat Bmet Replace electrolytes as indicated IVF NS pos balance with iles Keep k greater 4, mag greater 2, re assess in am   GASTROINTESTINAL A:   Ileus, suspect due to pain medications, immobility Nutrition  P:   NPO NGT to intermittent suction SUP: IV protonix Need to limit TPN ASAP Assess abdo film Double reglan and to q6h Assess ecg for qtc with this increase Add dulc supp May need additional cathartic, assess for passing gas  HEMATOLOGIC A:   Sickle cell disease  P:  Repeat CBC Heparin for DVT ppx Monitor and keep pos balance Will ensure this is SS dz not [place] dz (no lasix)  INFECTIOUS A:   No evidence for infection  P:   Recheck CBC Monitor clinically  ENDOCRINE A:   DM 2  P:   CBG monitoring and SSI to q 4 hours while NPO  NEUROLOGIC A:   C4-C5 mass s/p resection with weakness and sensation loss to extremitites  P:   RASS goal: 0 Neurosurgery primary Decadron Holding PO flexeril, Topamax Dilaudid low dse as able Neruochecks  FAMILY  - Updates: patient updated bedside.  - Inter-disciplinary family meet or Palliative Care meeting due by:  1/19 is ICU day 7  Global: repeat abdo film assess air in rectum, add dulc supp, increase Reglan, keep pos balance  ,Lavon Paganini. Titus Mould, MD, [place] Pgr: [place] [facility]

## 2014-12-19 NOTE — Progress Notes (Signed)
Postop day 6. Patient still complains of neck spasms and abdominal pain. Still no flatus.  Afebrile. Vitals are stable. Urine output adequate. NG tube output remains low. Patient awake and alert. Oriented and appropriate. Upper extremity motor function 4+ over 5 left somewhat better than right but good dexterity bilaterally. Can do fine motor tasks with both hands. Lower extremity strength 4 over 5 left lower extremity 3 over 5 right lower extremity. More sensory deficits on right than left. Wound clean and dry. Abdomen remains distended and somewhat tense but nontender. Bowel sounds hypoactive.  Progressing well. Begin to wean off steroids. Mobilize today. Continue NG tube decompression for postoperative ileus.

## 2014-12-19 NOTE — Progress Notes (Signed)
PARENTERAL NUTRITION CONSULT NOTE - FOLLOW UP  Pharmacy Consult for TPN Indication: Prolonged Ileus   Allergies  Allergen Reactions  . Amoxicillin Hives and Rash    Patient Measurements: Height: 5\' 4"  (162.6 cm) (PER PT REPORT) Weight: (!) 344 lb 2.2 oz (156.1 kg) IBW/kg (Calculated) : 54.7  Vital Signs: Temp: 98.3 F (36.8 C) (01/20 0351) BP: 147/92 mmHg (01/20 0700) Pulse Rate: 103 (01/20 0700) Intake/Output from previous day: 01/19 0701 - 01/20 0700 In: 2071.2 [I.V.:1120.7; IV Piggyback:57.5; TPN:893] Out: 1275 [Urine:1275] Intake/Output from this shift: Total I/O In: 140 [I.V.:80; TPN:60] Out: -   Labs:  Recent Labs  12/17/14 1130 12/18/14 0211 12/19/14 0510  WBC 21.0* 15.6* 15.3*  HGB 11.3* 10.5* 10.1*  HCT 33.8* 32.1* 30.5*  PLT 524* 491* 528*     Recent Labs  12/17/14 1130 12/18/14 0211 12/19/14 0510  NA 133* 133* 134*  K 4.3 4.9 4.5  CL 101 104 106  CO2 21 17* 22  GLUCOSE 248* 275* 306*  BUN 29* 36* 31*  CREATININE 0.93 1.02 0.86  CALCIUM 8.5 8.4 8.3*  MG  --  2.6* 2.9*  PHOS  --  2.8 2.9  PROT  --  7.1  --   ALBUMIN  --  2.5*  --   AST  --  17  --   ALT  --  10  --   ALKPHOS  --  73  --   BILITOT  --  0.4  --   PREALBUMIN  --  18.8  --   TRIG  --  138  --    Estimated Creatinine Clearance: 137.4 mL/min (by C-G formula based on Cr of 0.86).    Recent Labs  12/18/14 2033 12/19/14 0006 12/19/14 0348  GLUCAP 192* 204* 282*    Medical History: Past Medical History  Diagnosis Date  . Hypertension   . Asthma   . Type II diabetes mellitus   . History of blood transfusion     "after I had one of my kids"  . Sickle cell disease   . Anemia   . Depression   . Ovarian cyst   . Renal disorder     kidney stones    Medications:  Prescriptions prior to admission  Medication Sig Dispense Refill Last Dose  . hydrOXYzine (VISTARIL) 25 MG capsule Take 25 mg by mouth at bedtime.   12/10/2014 at Unknown time  . insulin aspart  (NOVOLOG) 100 UNIT/ML injection Inject 20 Units into the skin 3 (three) times daily.    12/10/2014  . insulin glargine (LANTUS) 100 UNIT/ML injection Inject 50 Units into the skin 2 (two) times daily.    12/10/2014  . losartan (COZAAR) 25 MG tablet Take 25 mg by mouth daily.   12/10/2014 at Unknown time  . metFORMIN (GLUCOPHAGE) 1000 MG tablet Take 1,000 mg by mouth 2 (two) times daily.   12/10/2014  . metoprolol tartrate (LOPRESSOR) 25 MG tablet Take 25 mg by mouth daily.   12/10/2014  . topiramate (TOPAMAX) 25 MG tablet Take 2 tablets (50 mg total) by mouth 2 (two) times daily. 120 tablet 3 12/10/2014 at Unknown time    Insulin Requirements in the past 24 hours:  33 units SSI + 20 units regular insulin in TPN   Assessment: 72 YOF who was admitted with c/o neck pain with radiation to BUE and loss of bowel and bladder function. MRI showed C4-C5 mass. 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  GI: POD #6, Abdominal x-ray on 1/8 showed generalized bowel dilation, suspected ileus or enteritis. Pt with abdominal pain which has improved with NG-tube decompression. Still no flatus. NG tube output remains low. On Naloxegol  Endo: CBGs remain elevated 192-306, on resistant SSI, Decadron QID>>BID Lytes: Na 134, Mg 2.9, K 4.5, Phos 2.9, CorrCa wnl  Renal: SCr stable 0.86, NS @ 40 mL/hr, UOP 0.3 mL/kg/hr, I/O +736 mL Pulm: RA Cards: : BP elevated, tachy, Drips, metoprolol TID, prn labetalol, TG 138 Hepatobil: LFTs wnl   Neuro: Dilaudid PRN ID: WBC down to 15.3 Best Practices: Heparin SQ, PPI IV  TPN Access: CVC triple lumen (1/18>>) TPN day#: 2  Current Nutrition:  Clinimix E 5/20 at 60 mL/hr + IVFE at 10 mL/hr on M/W/F Goal Nutrition: Clinimix E 5/15 @ 100 mL/hr + IVFE at 10 mL/hr   Nutritional Goals:  2200-2400 Kcal; 120-140 gm Protein   Plan:  -Switch back to Clinimix E 5/15 and increase to 83 mL/hr + increase IVFE at 10 mL/hr to daily due to resolution of  lipid shortage. Advance to goal as tolerated  -Decrease NS infusion to KVO to accommodate TPN rate increase   -Increase regular insulin in TPN bag to 30 units due to persistent hyperglycemia requiring > 30 units of additional insulin daily  -Daily MVI and TE  -F/u AM labs  -Monitor electrolytes closely  Albertina Parr, PharmD., BCPS Clinical Pharmacist Pager [phone]

## 2014-12-19 NOTE — Progress Notes (Signed)
Physical Therapy Treatment Patient Details Name: Cynthia Rosales MRN: [id number] DOB: 08-Aug-1981 Today's Date: 12/19/2014    History of Present Illness 34 year old female with a two-year history of progressive neck pain with radiation into both upper extremities with progressive numbness weakness and recent loss of bowel and bladder function and now s/p C3-6 Lami with tumor resection.  pt now developed Ileus.      PT Comments    Pt needs encouragement for mobility and was limited by pain in R shoulder.  Pt was able to transfer to recliner today with 2 person A and feel with pain controlled pt would be able to attempt ambulating soon.  Will continue to follow.    Follow Up Recommendations  CIR     Equipment Recommendations  None recommended by PT    Recommendations for Other Services       Precautions / Restrictions Precautions Precautions: Fall;Cervical Cervical Brace: Soft collar;For comfort Restrictions Weight Bearing Restrictions: No    Mobility  Bed Mobility Overal bed mobility: Needs Assistance;+2 for physical assistance Bed Mobility: Supine to Sit     Supine to sit: Mod assist;+2 for physical assistance;HOB elevated     General bed mobility comments: pt c/o pain in R shoulder this am, but was able to A with coming to sitting.    Transfers Overall transfer level: Needs assistance Equipment used: 2 person hand held assist Transfers: Sit to/from [facility] Sit to Stand: Mod assist;+2 physical assistance Stand pivot transfers: Mod assist;+2 physical assistance       General transfer comment: cues for step-by-step through mobility as pt having difficulty focusing on task and not on shoulder pain.    Ambulation/Gait                 Stairs            Wheelchair Mobility    Modified Rankin (Stroke Patients Only)       Balance Overall balance assessment: Needs assistance Sitting-balance support: Single extremity supported;Feet  supported Sitting balance-Leahy Scale: Poor     Standing balance support: During functional activity Standing balance-Leahy Scale: Poor                      Cognition Arousal/Alertness: Awake/alert Behavior During Therapy: WFL for tasks assessed/performed Overall Cognitive Status: Within Functional Limits for tasks assessed                      Exercises      General Comments        Pertinent Vitals/Pain Pain Assessment: 0-10 Pain Score: 8  Pain Location: R shoulder Pain Descriptors / Indicators: Sharp Pain Intervention(s): Monitored during session;Premedicated before session;Repositioned    Home Living                      Prior Function            PT Goals (current goals can now be found in the care plan section) Acute Rehab PT Goals Patient Stated Goal: Less pain.   PT Goal Formulation: With patient Time For Goal Achievement: 12/28/14 Potential to Achieve Goals: Good Progress towards PT goals: Progressing toward goals    Frequency  Min 3X/week    PT Plan Current plan remains appropriate    Co-evaluation             End of Session Equipment Utilized During Treatment: Gait belt Activity Tolerance: Patient limited by pain Patient left: in  chair;with call bell/phone within reach     Time: 0907-0935 PT Time Calculation (min) (ACUTE ONLY): 28 min  Charges:  $Therapeutic Activity: 23-37 mins                    G CodesCatarina Hartshorn, [place] [phone] 12/19/2014, 4:13 PM

## 2014-12-20 ENCOUNTER — Inpatient Hospital Stay (HOSPITAL_COMMUNITY): Payer: Medicaid Other

## 2014-12-20 DIAGNOSIS — R1013 Epigastric pain: Secondary | ICD-10-CM

## 2014-12-20 LAB — COMPREHENSIVE METABOLIC PANEL
ALT: 15 U/L (ref 0–35)
AST: 18 U/L (ref 0–37)
Albumin: 2.2 g/dL — ABNORMAL LOW (ref 3.5–5.2)
Alkaline Phosphatase: 69 U/L (ref 39–117)
Anion gap: 8 (ref 5–15)
BUN: 23 mg/dL (ref 6–23)
CO2: 21 mmol/L (ref 19–32)
Calcium: 7.8 mg/dL — ABNORMAL LOW (ref 8.4–10.5)
Chloride: 101 mEq/L (ref 96–112)
Creatinine, Ser: 0.72 mg/dL (ref 0.50–1.10)
GFR calc Af Amer: >90 mL/min (ref >90)
GFR calc non Af Amer: >90 mL/min (ref >90)
Glucose, Bld: 701 mg/dL (ref 70–99)
Potassium: 5.1 mmol/L (ref 3.5–5.1)
Sodium: 130 mmol/L — ABNORMAL LOW (ref 135–145)
Total Bilirubin: 0.1 mg/dL — ABNORMAL LOW (ref 0.3–1.2)
Total Protein: 6 g/dL (ref 6.0–8.3)

## 2014-12-20 LAB — CBC WITH DIFFERENTIAL/PLATELET
Basophils Absolute: 0 10*3/uL (ref 0.0–0.1)
Basophils Relative: 0 % (ref 0–1)
Eosinophils Absolute: 0 10*3/uL (ref 0.0–0.7)
Eosinophils Relative: 0 % (ref 0–5)
HCT: 29.7 % — ABNORMAL LOW (ref 36.0–46.0)
Hemoglobin: 9.7 g/dL — ABNORMAL LOW (ref 12.0–15.0)
Lymphocytes Relative: 25 % (ref 12–46)
Lymphs Abs: 4 10*3/uL (ref 0.7–4.0)
MCH: 23 pg — ABNORMAL LOW (ref 26.0–34.0)
MCHC: 32.7 g/dL (ref 30.0–36.0)
MCV: 70.5 fL — ABNORMAL LOW (ref 78.0–100.0)
Monocytes Absolute: 1.2 10*3/uL — ABNORMAL HIGH (ref 0.1–1.0)
Monocytes Relative: 8 % (ref 3–12)
Neutro Abs: 10.5 10*3/uL — ABNORMAL HIGH (ref 1.7–7.7)
Neutrophils Relative %: 67 % (ref 43–77)
Platelets: 493 10*3/uL — ABNORMAL HIGH (ref 150–400)
RBC: 4.21 MIL/uL (ref 3.87–5.11)
RDW: 17.3 % — ABNORMAL HIGH (ref 11.5–15.5)
WBC: 15.7 10*3/uL — ABNORMAL HIGH (ref 4.0–10.5)

## 2014-12-20 LAB — GLUCOSE, CAPILLARY
Glucose-Capillary: 234 mg/dL — ABNORMAL HIGH (ref 70–99)
Glucose-Capillary: 202 mg/dL — ABNORMAL HIGH (ref 70–99)
Glucose-Capillary: 206 mg/dL — ABNORMAL HIGH (ref 70–99)
Glucose-Capillary: 207 mg/dL — ABNORMAL HIGH (ref 70–99)
Glucose-Capillary: 171 mg/dL — ABNORMAL HIGH (ref 70–99)
Glucose-Capillary: 191 mg/dL — ABNORMAL HIGH (ref 70–99)

## 2014-12-20 LAB — MAGNESIUM: Magnesium: 2.5 mg/dL (ref 1.5–2.5)

## 2014-12-20 LAB — GLUCOSE, RANDOM: Glucose, Bld: 211 mg/dL — ABNORMAL HIGH (ref 70–99)

## 2014-12-20 LAB — PHOSPHORUS: Phosphorus: 4.4 mg/dL (ref 2.3–4.6)

## 2014-12-20 MED ORDER — TRACE MINERALS CR-CU-F-FE-I-MN-MO-SE-ZN IV SOLN
INTRAVENOUS | Status: DC
  Filled 2014-12-20: qty 2400

## 2014-12-20 MED ORDER — TRACE MINERALS CR-CU-F-FE-I-MN-MO-SE-ZN IV SOLN
INTRAVENOUS | Status: DC
  Administered 2014-12-20: 17:00:00 via INTRAVENOUS
  Filled 2014-12-20: qty 2400

## 2014-12-20 MED ORDER — FAT EMULSION 20 % IV EMUL
240.0000 mL | INTRAVENOUS | Status: DC
  Filled 2014-12-20: qty 250

## 2014-12-20 MED ORDER — FAT EMULSION 20 % IV EMUL
240.0000 mL | INTRAVENOUS | Status: DC
  Administered 2014-12-20: 240 mL via INTRAVENOUS
  Filled 2014-12-20: qty 250

## 2014-12-20 MED ORDER — FLEET ENEMA 7-19 GM/118ML RE ENEM
1.0000 | ENEMA | Freq: Once | RECTAL | Status: AC
  Administered 2014-12-20: 1 via RECTAL
  Filled 2014-12-20: qty 1

## 2014-12-20 MED ORDER — SODIUM CHLORIDE 0.9 % IV BOLUS (SEPSIS)
500.0000 mL | Freq: Once | INTRAVENOUS | Status: AC
  Administered 2014-12-20: 500 mL via INTRAVENOUS

## 2014-12-20 NOTE — Progress Notes (Signed)
Physical Therapy Treatment Patient Details Name: Cynthia Rosales MRN: [id number] DOB: Apr 02, 1981 Today's Date: 12/20/2014    History of Present Illness 34 year old female with a two-year history of progressive neck pain with radiation into both upper extremities with progressive numbness weakness and recent loss of bowel and bladder function and now s/p C3-6 Lami with tumor resection.  pt now developed Ileus.      PT Comments    Pt making improvements in mobility and activity tolerance today, though HR elevated to 150's with transfer to recliner.  Will continue to follow.    Follow Up Recommendations  CIR     Equipment Recommendations  None recommended by PT    Recommendations for Other Services       Precautions / Restrictions Precautions Precautions: Fall;Cervical Cervical Brace: Soft collar;For comfort Restrictions Weight Bearing Restrictions: No    Mobility  Bed Mobility Overal bed mobility: Needs Assistance Bed Mobility: Supine to Sit     Supine to sit: Min assist     General bed mobility comments: pt much improved and able to complete majority of mobility without A and only needed physical A to scoot hips to EOB.    Transfers Overall transfer level: Needs assistance Equipment used: 2 person hand held assist Transfers: Sit to/from [facility] Sit to Stand: Mod assist;+2 physical assistance Stand pivot transfers: Mod assist;+2 physical assistance       General transfer comment: cues for step-by-step through mobility and indicates R LE fatiguing.  pt able to remain in standing position an additional 1 min prior to needing to sit.    Ambulation/Gait                 Stairs            Wheelchair Mobility    Modified Rankin (Stroke Patients Only)       Balance Overall balance assessment: Needs assistance Sitting-balance support: No upper extremity supported;Feet supported Sitting balance-Leahy Scale: Fair     Standing  balance support: During functional activity Standing balance-Leahy Scale: Poor                      Cognition Arousal/Alertness: Awake/alert Behavior During Therapy: WFL for tasks assessed/performed Overall Cognitive Status: Within Functional Limits for tasks assessed                      Exercises      General Comments        Pertinent Vitals/Pain Pain Assessment: 0-10 Pain Score: 5  Pain Location: Neck Pain Descriptors / Indicators: Aching Pain Intervention(s): Monitored during session;Premedicated before session;Repositioned    Home Living                      Prior Function            PT Goals (current goals can now be found in the care plan section) Acute Rehab PT Goals Patient Stated Goal: Less pain.   PT Goal Formulation: With patient Time For Goal Achievement: 12/28/14 Potential to Achieve Goals: Good Progress towards PT goals: Progressing toward goals    Frequency  Min 3X/week    PT Plan Current plan remains appropriate    Co-evaluation             End of Session Equipment Utilized During Treatment: Gait belt Activity Tolerance: Patient limited by fatigue Patient left: in chair;with call bell/phone within reach     Time: 0903-0930 PT Time  Calculation (min) (ACUTE ONLY): 27 min  Charges:  $Therapeutic Activity: 23-37 mins                    G CodesCatarina Rosales, [place] [phone] 12/20/2014, 12:06 PM

## 2014-12-20 NOTE — Progress Notes (Signed)
PULMONARY / CRITICAL CARE MEDICINE   Name: Cynthia Rosales MRN: [id number] DOB: 06/22/1981    ADMISSION DATE:  12/10/2014 CONSULTATION DATE:  12/17/2014  REFERRING MD :  Dr. Annette Stable ([facility])  CHIEF COMPLAINT:  C4-C5 mass.  INITIAL PRESENTATION: 34 year old female with who was admitted from neurosurgical office 1/11. She c/o neck pain with radiation to BUE and loss of bowel/bladder function. MRI showed C4-C5 mass. Taken to OR 1/14.   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 This lesion enhances on today's post-contrast sequences, consistent with a neoplastic process. 1/18 Abdominal Xray > Generalized bowel dilation, suspected ileus or enteritis.  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, microdissection 1/21 still no BM, working w/ PT. Ileus remains largest issue   SUBJECTIVE: No BM, but feels like it'll be soon. Wants to eat.. Is frustrated.   VITAL SIGNS: Temp:  [97.6 F (36.4 C)-98.9 F (37.2 C)] 98.3 F (36.8 C) (01/21 0400) Pulse Rate:  [104-128] 116 (01/21 0700) Resp:  [19-26] 26 (01/21 0700) BP: (126-153)/(83-104) 139/86 mmHg (01/21 0600) SpO2:  [95 %-100 %] 97 % (01/21 0700) Weight:  [156 kg (343 lb 14.7 oz)] 156 kg (343 lb 14.7 oz) (01/21 0500)   HEMODYNAMICS:   VENTILATOR SETTINGS:   INTAKE / OUTPUT:  Intake/Output Summary (Last 24 hours) at 12/20/14 0901 Last data filed at 12/20/14 0700  Gross per 24 hour  Intake 2346.99 ml  Output   1875 ml  Net 471.99 ml   PHYSICAL EXAMINATION: General:  Morbidly obese female in NAD Neuro:  Alert, oriented. HEENT:  jvd remains low Cardiovascular:  s1 s2 Tachy, regular Lungs:  Clear, distant bilateral breath sounds. Normal WOB, dressings intact  Abdomen:  Obese, soft, non-tender. Hypoactive bowel sounds but  increased Musculoskeletal:  Decreased sensation to RUE, LLE, some sensation distal RLE. Decreased ROM to all extremities. Worst RLE. 2/5.  Feels like this is improving  Skin:  Grossly intact  LABS:  CBC  Recent Labs Lab 12/18/14 0211 12/19/14 0510 12/20/14 0500  WBC 15.6* 15.3* 15.7*  HGB 10.5* 10.1* 9.7*  HCT 32.1* 30.5* 29.7*  PLT 491* 528* 493*   Coag's No results for input(s): APTT, INR in the last 168 hours. BMET  Recent Labs Lab 12/18/14 0211 12/19/14 0510 12/20/14 0645  NA 133* 134* 130*  K 4.9 4.5 5.1  CL 104 106 101  CO2 17* 22 21  BUN 36* 31* 23  CREATININE 1.02 0.86 0.72  GLUCOSE 275* 306* 701*   Electrolytes  Recent Labs Lab 12/18/14 0211 12/19/14 0510 12/20/14 0645  CALCIUM 8.4 8.3* 7.8*  MG 2.6* 2.9* 2.5  PHOS 2.8 2.9 4.4   Sepsis Markers No results for input(s): LATICACIDVEN, PROCALCITON, O2SATVEN in the last 168 hours. ABG No results for input(s): PHART, PCO2ART, PO2ART in the last 168 hours. Liver Enzymes  Recent Labs Lab 12/18/14 0211 12/20/14 0645  AST 17 18  ALT 10 15  ALKPHOS 73 69  BILITOT 0.4 0.1*  ALBUMIN 2.5* 2.2*   Cardiac Enzymes No results for input(s): TROPONINI, PROBNP in the last 168 hours. Glucose  Recent Labs Lab 12/19/14 1239 12/19/14 1534 12/19/14 2003 12/19/14 2356 12/20/14 0335 12/20/14 0741  GLUCAP 269* 229* 134* 183* 234* 202*    Imaging Dg Abd Portable 1v  12/19/2014   CLINICAL  DATA:  Bowel ileus.  History of renal disorder.  EXAM: PORTABLE ABDOMEN - 1 VIEW  COMPARISON:  12/17/2014  FINDINGS: Diffusely dilated colon. Possible dilated small bowel in the left abdomen, versus dilated redundant sigmoid colon. Body habitus reduces diagnostic sensitivity and specificity.  IMPRESSION: 1. Gaseous distention of the colon. Possible dilated small bowel loops. Appearance may well reflect ileus, correlate with bowel sounds.   Electronically Signed   By: Sherryl Barters M.D.   On: 12/19/2014 17:01      ASSESSMENT / PLAN:  PULMONARY A: H/o Asthma Concern for OHS ATX P:   PRN Xopenex Monitor pcxr IS important, goal q1h while awake Treating ileus contribution Upright- mobility as safe per NS  CARDIOVASCULAR A:  Sinus tachycardia H/o HTN  P:  Hold PO antihypertensives in setting of ileus IV metoprolol 5mg  TID PRN labetalol  Change IVF pos balance No lasix  RENAL A:   Mild hyponatremia   P:   Repeat Bmet Replace electrolytes as indicated With ileus keep k greater 4, mag 2 IVF NS pos balance with iles  GASTROINTESTINAL A:   Ileus, suspect due to pain medications, immobility Nutrition > has still not had BM, added dulcolax supp.  P:   NPO NGT to intermittent suction SUP: IV protonix Need to limit TPN ASAP reglan and to q6h qTc Checks daily No diet yet  Add fleets enema  Keep k greater 4, mag greater 2, re assess in am (for ileus) The colon approaches 10 cm , risk perf rises, may need CT and /or GI consult for decompression consideration if not responding to todays interventions  HEMATOLOGIC A:   Sickle cell disease It is not clear to pt or me this is SS or [place] dz Do not favor SS dz P:  Repeat CBC Heparin for DVT ppx Monitor and keep pos balance No diuresis  INFECTIOUS A:   No evidence for infection  P:   Recheck CBC Monitor clinically  ENDOCRINE A:   DM 2  P:   CBG monitoring and SSI to q 4 hours while NPO  NEUROLOGIC A:   C4-C5 mass s/p resection with weakness and sensation loss to extremitites >surgical path:Histologic type: Ependymoma, conventional type  P:   RASS goal: 0 Neurosurgery primary Decadron Holding PO flexeril, Topamax Dilaudid low dose as able [place] Mobility as able  FAMILY  - Updates: patient updated bedside.  - Inter-disciplinary family meet or Palliative Care meeting due by:  1/19 is ICU day 7   NP SUMMARY Strength perhaps a little better. Still No BM, wants to eat. Feels like she is passing  flatus but no results from BM stand-point. Will give Fleets enema, cont NPO, Careful w/ narcs.   Erick Colace ACNP-BC [place] Pager # [phone] OR # [phone] if no answer  STAFF NOTE: I, Merrie Roof, MD FACP have personally reviewed patient's available data, including medical history, events of note, physical examination and test results as part of my evaluation. I have discussed with resident/NP and other care providers such as pharmacist, RN and RRT. In addition, I personally evaluated patient and elicited key findings of: she may be passing gas, adding enema, concern for almost 10 cm colon, may need GI decompression / CT, await response to enema today, follow qtc  Lavon Paganini. Titus Mould, MD, [place] Pgr: [facility] 12/20/2014 9:31 AM

## 2014-12-20 NOTE — Progress Notes (Signed)
MD aware enema effective. HR 120s. 500 cc NS bolus ordered.

## 2014-12-20 NOTE — Progress Notes (Signed)
Looks better. Pain reasonably well-controlled. Neurologic exam unchanged. Wound clean and dry. Still no flatus. Abdominal pain less.  Continue NG tube decompression for postoperative ileus. Continue efforts at mobilization.

## 2014-12-20 NOTE — Progress Notes (Signed)
UR completed.  Therapies recommending CIR at d/c. Await resolution of ileus.  Sandi Mariscal, RN BSN [place] CCM Trauma/Neuro ICU Case Manager [phone]

## 2014-12-20 NOTE — Progress Notes (Addendum)
PARENTERAL NUTRITION CONSULT NOTE - FOLLOW UP  Pharmacy Consult for TPN Indication: Prolonged Ileus   Allergies  Allergen Reactions  . Amoxicillin Hives and Rash    Patient Measurements: Height: 5\' 4"  (162.6 cm) (PER PT REPORT) Weight: (!) 343 lb 14.7 oz (156 kg) IBW/kg (Calculated) : 54.7  Vital Signs: Temp: 97.5 F (36.4 C) (01/21 0800) Temp Source: Oral (01/21 0400) BP: 139/86 mmHg (01/21 0600) Pulse Rate: 116 (01/21 0700) Intake/Output from previous day: 01/20 0701 - 01/21 0700 In: 2487 [P.O.:120; I.V.:467.8; IV Piggyback:57.5; TPN:1841.7] Out: 1875 [Urine:1675; Emesis/NG output:200] Intake/Output from this shift:    Labs:  Recent Labs  12/18/14 0211 12/19/14 0510 12/20/14 0500  WBC 15.6* 15.3* 15.7*  HGB 10.5* 10.1* 9.7*  HCT 32.1* 30.5* 29.7*  PLT 491* 528* 493*     Recent Labs  12/18/14 0211 12/19/14 0510 12/20/14 0645 12/20/14 0828  NA 133* 134* 130*  --   K 4.9 4.5 5.1  --   CL 104 106 101  --   CO2 17* 22 21  --   GLUCOSE 275* 306* 701* 211*  BUN 36* 31* 23  --   CREATININE 1.02 0.86 0.72  --   CALCIUM 8.4 8.3* 7.8*  --   MG 2.6* 2.9* 2.5  --   PHOS 2.8 2.9 4.4  --   PROT 7.1  --  6.0  --   ALBUMIN 2.5*  --  2.2*  --   AST 17  --  18  --   ALT 10  --  15  --   ALKPHOS 73  --  69  --   BILITOT 0.4  --  0.1*  --   PREALBUMIN 18.8  --   --   --   TRIG 138  --   --   --    Estimated Creatinine Clearance: 147.5 mL/min (by C-G formula based on Cr of 0.72).    Recent Labs  12/19/14 2356 12/20/14 0335 12/20/14 0741  GLUCAP 183* 234* 202*    Medical History: Past Medical History  Diagnosis Date  . Hypertension   . Asthma   . Type II diabetes mellitus   . History of blood transfusion     "after I had one of my kids"  . Sickle cell disease   . Anemia   . Depression   . Ovarian cyst   . Renal disorder     kidney stones    Medications:  Prescriptions prior to admission  Medication Sig Dispense Refill Last Dose  .  hydrOXYzine (VISTARIL) 25 MG capsule Take 25 mg by mouth at bedtime.   12/10/2014 at Unknown time  . insulin aspart (NOVOLOG) 100 UNIT/ML injection Inject 20 Units into the skin 3 (three) times daily.    12/10/2014  . insulin glargine (LANTUS) 100 UNIT/ML injection Inject 50 Units into the skin 2 (two) times daily.    12/10/2014  . losartan (COZAAR) 25 MG tablet Take 25 mg by mouth daily.   12/10/2014 at Unknown time  . metFORMIN (GLUCOPHAGE) 1000 MG tablet Take 1,000 mg by mouth 2 (two) times daily.   12/10/2014  . metoprolol tartrate (LOPRESSOR) 25 MG tablet Take 25 mg by mouth daily.   12/10/2014  . topiramate (TOPAMAX) 25 MG tablet Take 2 tablets (50 mg total) by mouth 2 (two) times daily. 120 tablet 3 12/10/2014 at Unknown time    Insulin Requirements in the past 24 hours:  43 units SSI + 30 units regular insulin in TPN  Assessment: 36 YOF who was admitted with c/o neck pain with radiation to BUE and loss of bowel and bladder function. MRI showed C4-C5 mass. Taken to OR on 1/14 for C3-C6 laminectomy with resection of spinal cord neoplasm.  GI: POD #7, Abdominal x-ray on 1/8 showed generalized bowel dilation, suspected ileus or enteritis. Pt with abdominal pain which has improved with NG-tube decompression. Still no flatus or BM. NG tube output remains low ~200 mL. On Naloxegol, PPI  Endo: CBGs remain elevated 130-306, one CBG up to 700, on resistant SSI, Decadron QID>>BID Lytes: Na 130, Mg 2.5, K 5.1, Phos 4.4, CorrCa 9.2, Ca x Phos 40 Renal: SCr stable 0.72, NS KVO, UOP 0.4 mL/kg/hr, I/O + 0.5 L Pulm: RA Cards: : BP elevated, tachy, Drips, metoprolol TID, prn labetalol, TG 138 Hepatobil: 1/21 LFTs wnl   Neuro: Dilaudid PRN ID: WBC down to 15.7 Best Practices: Heparin SQ, PPI IV  TPN Access: CVC triple lumen (1/18>>) TPN day#: 3  Current Nutrition:  Clinimix E 5/15 at 83 mL/hr + IVFE at 10 mL/hr on M/W/F Goal Nutrition: Clinimix E 5/15 @ 100 mL/hr + IVFE at 10 mL/hr    Nutritional  Goals:  2200-2400 Kcal; 120-140 gm Protein, per RD note 1/18    Plan:  -Change to Clinimix 5/15 (WITHOUT electrolytes - due to K, Phos, Mg at high end of ref range) and increase to 100 mL/hr + continue 20% lipids at 10 mL/hr; this will provide 2184 kcal and 120 g protein, meeting 100% of protein needs and 99% of caloric needs -Increase regular insulin in TPN bag to 40 units due to persistent hyperglycemia requiring > 40 units of additional insulin daily  -Daily MVI and TE  -Watch CBGs   Hughes Better, PharmD, BCPS Clinical Pharmacist Pager: [phone] 12/20/2014 9:55 AM

## 2014-12-21 ENCOUNTER — Inpatient Hospital Stay (HOSPITAL_COMMUNITY): Payer: Medicaid Other

## 2014-12-21 DIAGNOSIS — J9811 Atelectasis: Secondary | ICD-10-CM

## 2014-12-21 DIAGNOSIS — R1010 Upper abdominal pain, unspecified: Secondary | ICD-10-CM

## 2014-12-21 LAB — COMPREHENSIVE METABOLIC PANEL
ALT: 25 U/L (ref 0–35)
AST: 22 U/L (ref 0–37)
Albumin: 2.1 g/dL — ABNORMAL LOW (ref 3.5–5.2)
Alkaline Phosphatase: 73 U/L (ref 39–117)
Anion gap: 5 (ref 5–15)
BUN: 15 mg/dL (ref 6–23)
CO2: 26 mmol/L (ref 19–32)
Calcium: 7.6 mg/dL — ABNORMAL LOW (ref 8.4–10.5)
Chloride: 101 mEq/L (ref 96–112)
Creatinine, Ser: 0.66 mg/dL (ref 0.50–1.10)
GFR calc Af Amer: >90 mL/min (ref >90)
GFR calc non Af Amer: >90 mL/min (ref >90)
Glucose, Bld: 231 mg/dL — ABNORMAL HIGH (ref 70–99)
Potassium: 3.5 mmol/L (ref 3.5–5.1)
Sodium: 132 mmol/L — ABNORMAL LOW (ref 135–145)
Total Bilirubin: 0.5 mg/dL (ref 0.3–1.2)
Total Protein: 5.8 g/dL — ABNORMAL LOW (ref 6.0–8.3)

## 2014-12-21 LAB — CBC
HCT: 28.6 % — ABNORMAL LOW (ref 36.0–46.0)
Hemoglobin: 9.2 g/dL — ABNORMAL LOW (ref 12.0–15.0)
MCH: 22.3 pg — ABNORMAL LOW (ref 26.0–34.0)
MCHC: 32.2 g/dL (ref 30.0–36.0)
MCV: 69.4 fL — ABNORMAL LOW (ref 78.0–100.0)
Platelets: 401 10*3/uL — ABNORMAL HIGH (ref 150–400)
RBC: 4.12 MIL/uL (ref 3.87–5.11)
RDW: 17 % — ABNORMAL HIGH (ref 11.5–15.5)
WBC: 13.2 10*3/uL — ABNORMAL HIGH (ref 4.0–10.5)

## 2014-12-21 LAB — URINALYSIS, ROUTINE W REFLEX MICROSCOPIC
Bilirubin Urine: NEGATIVE
Glucose, UA: NEGATIVE mg/dL
Ketones, ur: NEGATIVE mg/dL
Nitrite: POSITIVE — AB
Protein, ur: 30 mg/dL — AB
Specific Gravity, Urine: 1.02 (ref 1.005–1.030)
Urobilinogen, UA: 1 mg/dL (ref 0.0–1.0)
pH: 5.5 (ref 5.0–8.0)

## 2014-12-21 LAB — URINE MICROSCOPIC-ADD ON

## 2014-12-21 LAB — GLUCOSE, CAPILLARY
Glucose-Capillary: 123 mg/dL — ABNORMAL HIGH (ref 70–99)
Glucose-Capillary: 159 mg/dL — ABNORMAL HIGH (ref 70–99)
Glucose-Capillary: 165 mg/dL — ABNORMAL HIGH (ref 70–99)
Glucose-Capillary: 202 mg/dL — ABNORMAL HIGH (ref 70–99)
Glucose-Capillary: 210 mg/dL — ABNORMAL HIGH (ref 70–99)
Glucose-Capillary: 228 mg/dL — ABNORMAL HIGH (ref 70–99)

## 2014-12-21 LAB — OSMOLALITY, URINE: Osmolality, Ur: 697 mOsm/kg (ref 390–1090)

## 2014-12-21 LAB — SODIUM, URINE, RANDOM: Sodium, Ur: 123 mmol/L

## 2014-12-21 LAB — OSMOLALITY: Osmolality: 282 mOsm/kg (ref 275–300)

## 2014-12-21 MED ORDER — FLEET ENEMA 7-19 GM/118ML RE ENEM
1.0000 | ENEMA | Freq: Once | RECTAL | Status: DC
  Filled 2014-12-21: qty 1

## 2014-12-21 MED ORDER — DEXAMETHASONE SODIUM PHOSPHATE 4 MG/ML IJ SOLN
1.0000 mg | Freq: Two times a day (BID) | INTRAMUSCULAR | Status: DC
  Administered 2014-12-21 – 2014-12-22 (×2): 1 mg via INTRAVENOUS
  Filled 2014-12-21 (×2): qty 1

## 2014-12-21 MED ORDER — BACLOFEN 10 MG PO TABS
10.0000 mg | ORAL_TABLET | Freq: Four times a day (QID) | ORAL | Status: DC
  Administered 2014-12-21 – 2014-12-24 (×10): 10 mg via ORAL
  Filled 2014-12-21 (×10): qty 1

## 2014-12-21 MED ORDER — POTASSIUM CHLORIDE 10 MEQ/50ML IV SOLN
10.0000 meq | INTRAVENOUS | Status: AC
  Administered 2014-12-21 (×4): 10 meq via INTRAVENOUS
  Filled 2014-12-21 (×4): qty 50

## 2014-12-21 MED ORDER — BACLOFEN 10 MG PO TABS
5.0000 mg | ORAL_TABLET | Freq: Two times a day (BID) | ORAL | Status: DC
  Administered 2014-12-21: 5 mg via ORAL
  Filled 2014-12-21 (×2): qty 1

## 2014-12-21 NOTE — Progress Notes (Addendum)
PULMONARY / CRITICAL CARE MEDICINE   Name: Cynthia Rosales MRN: [id number] DOB: 1980-12-08    ADMISSION DATE:  12/10/2014 CONSULTATION DATE:  12/17/2014  REFERRING MD :  Dr. Annette Stable ([facility])  CHIEF COMPLAINT:  C4-C5 mass.  INITIAL PRESENTATION: 34 year old female with who was admitted from neurosurgical office 1/11. She c/o neck pain with radiation to BUE and loss of bowel/bladder function. MRI showed C4-C5 mass. Taken to OR 1/14.   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 This lesion enhances on today's post-contrast sequences, consistent with a neoplastic process. 1/18 Abdominal Xray > Generalized bowel dilation, suspected ileus or enteritis.  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, microdissection 1/21 still no BM, working w/ PT. Ileus remains largest issue 1/21- big BM noted after enema  SUBJECTIVE: had a nice big BM and gas, ngt output minimal  VITAL SIGNS: Temp:  [98.5 F (36.9 C)-99.3 F (37.4 C)] 98.8 F (37.1 C) (01/22 0800) Pulse Rate:  [94-178] 94 (01/22 0600) Resp:  [19-30] 23 (01/22 0600) BP: (138-154)/(68-84) 138/72 mmHg (01/22 0411) SpO2:  [96 %-100 %] 97 % (01/22 0600) Weight:  [158.2 kg (348 lb 12.3 oz)] 158.2 kg (348 lb 12.3 oz) (01/22 0500)   HEMODYNAMICS:   VENTILATOR SETTINGS:   INTAKE / OUTPUT:  Intake/Output Summary (Last 24 hours) at 12/21/14 0943 Last data filed at 12/21/14 0800  Gross per 24 hour  Intake 3078.5 ml  Output   1450 ml  Net 1628.5 ml   PHYSICAL EXAMINATION: General:  Morbidly obese female in NAD Neuro:  Alert, oriented.calm HEENT:  jvd increased Cardiovascular:  s1 s2 Tachy mildular Lungs:  Clear Abdomen:  Obese, soft, non-tender. Hypo BS less tickling, less distention Musculoskeletal:  Decreased sensation to RUE, LLE,  weakness rt, no changes Skin:  Grossly intact  LABS:  CBC  Recent Labs Lab 12/19/14 0510 12/20/14 0500 12/21/14 0530  WBC 15.3* 15.7* 13.2*  HGB 10.1* 9.7* 9.2*  HCT 30.5* 29.7* 28.6*  PLT 528* 493* 401*   Coag's No results for input(s): APTT, INR in the last 168 hours. BMET  Recent Labs Lab 12/19/14 0510 12/20/14 0645 12/20/14 0828 12/21/14 0530  NA 134* 130*  --  132*  K 4.5 5.1  --  3.5  CL 106 101  --  101  CO2 22 21  --  26  BUN 31* 23  --  15  CREATININE 0.86 0.72  --  0.66  GLUCOSE 306* 701* 211* 231*   Electrolytes  Recent Labs Lab 12/18/14 0211 12/19/14 0510 12/20/14 0645 12/21/14 0530  CALCIUM 8.4 8.3* 7.8* 7.6*  MG 2.6* 2.9* 2.5  --   PHOS 2.8 2.9 4.4  --    Sepsis Markers No results for input(s): LATICACIDVEN, PROCALCITON, O2SATVEN in the last 168 hours. ABG No results for input(s): PHART, PCO2ART, PO2ART in the last 168 hours. Liver Enzymes  Recent Labs Lab 12/18/14 0211 12/20/14 0645 12/21/14 0530  AST 17 18 22   ALT 10 15 25   ALKPHOS 73 69 73  BILITOT 0.4 0.1* 0.5  ALBUMIN 2.5* 2.2* 2.1*   Cardiac Enzymes No results for input(s): TROPONINI, PROBNP in the last 168 hours. Glucose  Recent Labs Lab 12/20/14 1231 12/20/14 1619 12/20/14 2013 12/21/14 0013 12/21/14 0407 12/21/14 0832  GLUCAP 207* 171* 191* 202* 228*  210*    Imaging Dg Chest Port 1 View  12/20/2014   CLINICAL DATA:  Central catheter placement ; atelectasis  EXAM: PORTABLE CHEST - 1 VIEW  COMPARISON:  December 18, 2014  FINDINGS: Central catheter tip is in the superior vena cava near the cavoatrial junction. Nasogastric tube tip and side port are in the stomach. No pneumothorax. There is no appreciable atelectatic change. There is no edema or consolidation. Heart is mildly enlarged with pulmonary vascularity within normal limits.  IMPRESSION: Lungs clear. Heart enlarged but stable. Tube and catheter positions as described without pneumothorax.   Electronically  Signed   By: Lowella Grip M.D.   On: 12/20/2014 07:58     ASSESSMENT / PLAN:  PULMONARY A: H/o Asthma Concern for OHS ATX P:   PRN Xopenex Monitor pcxr for atx in am  IS important, goal q1h while awake Treating ileus contribution PT working with her  CARDIOVASCULAR A:  Sinus tachycardia -mild, component bowel sequestration from ileus H/o HTN  P:  IV metoprolol 5mg  TID, may be able to dc this, and NOT treat ST past 140 PRN labetalol  tpn is her volume, once tpn off, would kvo overall  RENAL A:   Mild hyponatremia   P:   Repeat Bmet With ileus keep k greater 4, mag 2 TPN likely cause of hyponatremia and I cannot alter formula Assess serum osm, urine na, osm  GASTROINTESTINAL A:   Ileus, suspect due to pain medications, immobility Nutrition S/p large BM and gas 1/21 P:   NGT to intermittent suction, will consider clamp and check residuals in 4 hrs, has min output SUP: IV protonix Need to limit TPN ASAP, once any form of oral nutrition started would dc tpn given harm of tpn and normal nutritional status on admission reglan and to q6h qTc Checks daily- has been normal, would like to reduce this in am  Will consider clears Repeat fleets enema  Keep k greater 4, mag greater 2, re assess in am (for ileus) The colon approaches 10 cm , risk perf rises, may need CT and /or GI consult for decompression, will assess kub now, although less liekly to need CT with recent output  HEMATOLOGIC A:   Sickle cell disease It is not clear to pt or me this is SS or  dz Do not favor SS dz P:  Heparin for DVT ppx Even balance No diuresis  INFECTIOUS A:   No evidence for infection  P:   Recheck CBC Monitor clinically  ENDOCRINE A:   DM 2  P:   CBG monitoring and SSI to q 4 hours while NPO  NEUROLOGIC A:   C4-C5 mass s/p resection with weakness and sensation loss to extremitites >surgical path:Histologic type: Ependymoma, conventional type  P:   RASS  goal: 0 Neurosurgery primary Decadron Holding PO flexeril, Topamax Dilaudid low dose as able [place] Mobility as able Adding baclofen reluctant with neck spasm  FAMILY  - Updates: patient updated bedside.  - Inter-disciplinary family meet or Palliative Care meeting due by:  1/19 is ICU day 7  To floor, dc tpn, clears, dc NGT, repeat fleets   Lavon Paganini. Titus Mould, MD, [place] Pgr: [facility] 12/21/2014 9:43 AM    Will follow in am

## 2014-12-21 NOTE — Progress Notes (Signed)
Inpatient Diabetes Program Recommendations  AACE/ADA: New Consensus Statement on Inpatient Glycemic Control (2013)  Target Ranges:  Prepandial:   less than 140 mg/dL      Peak postprandial:   less than 180 mg/dL (1-2 hours)      Critically ill patients:  140 - 180 mg/dL   Reason for Visit: Hyperglycemia  Diabetes history: DM2 Current orders for Inpatient glycemic control: Novolog resistant Q4H  Inpatient Diabetes Program Recommendations Insulin - Basal: Consider addition of Lantus 15 units QHS Correction (SSI): Novolog resistant Q4H HgbA1C: Will need updated HgbA1C when H/H improves  Note: Will continue to follow. Thank you. Lorenda Peck, RD, LDN, CDE Inpatient Diabetes Coordinator [phone]

## 2014-12-21 NOTE — Progress Notes (Signed)
Rehab admissions - Noted ongoing medical issues.  Not ready yet for inpatient rehab admission.  Will check back on Monday for progress.  Call me for questions.  #[phone]

## 2014-12-21 NOTE — Progress Notes (Signed)
Physical Therapy Treatment Patient Details Name: Cynthia Rosales MRN: [id number] DOB: 08/09/1981 Today's Date: 12/21/2014    History of Present Illness 33-year-old female with a two-year history of progressive neck pain with radiation into both upper extremities with progressive numbness weakness and recent loss of bowel and bladder function and now s/p C3-6 Lami with tumor resection.  pt now developed Ileus.      PT Comments    Pt making good progress. Continues to be very motivated.  Follow Up Recommendations  CIR     Equipment Recommendations  Other (comment) (to be assessed)    Recommendations for Other Services       Precautions / Restrictions Precautions Precautions: Fall;Cervical Cervical Brace: Soft collar;For comfort Restrictions Weight Bearing Restrictions: No    Mobility  Bed Mobility Overal bed mobility: Needs Assistance Bed Mobility: Supine to Sit     Supine to sit: Min assist (with bed inflated until she was 3/4 of way up, HOB up and use of rails)     General bed mobility comments: Verbal cues for technique and support of trunk to move hands for better leverage/support.  Transfers Overall transfer level: Needs assistance Equipment used: Rolling walker (2 wheeled) Transfers: Sit to/from Stand Sit to Stand: +2 physical assistance;Mod assist Stand pivot transfers: Mod assist;+2 physical assistance       General transfer comment: Verbal cues for hand placement. Assist to bring hips up.  Ambulation/Gait Ambulation/Gait assistance: +2 physical assistance;Min assist;Mod assist Ambulation Distance (Feet): 10 Feet (x 2) Assistive device: Rolling walker (2 wheeled) Gait Pattern/deviations: Step-through pattern;Decreased step length - right;Decreased step length - left   Gait velocity interpretation: Below normal speed for age/gender General Gait Details: Intermittent assist to advance RLE further. Pt with rt knee in hyperextension for support. If rt knee  not in hyperextension tends to give out with stance. Manual support of rt knee.   Stairs            Wheelchair Mobility    Modified Rankin (Stroke Patients Only)       Balance   Sitting-balance support: No upper extremity supported;Feet supported Sitting balance-Leahy Scale: Fair     Standing balance support: Bilateral upper extremity supported Standing balance-Leahy Scale: Poor Standing balance comment: Support of walker and min A for static standing                    Cognition Arousal/Alertness: Awake/alert Behavior During Therapy: WFL for tasks assessed/performed Overall Cognitive Status: Within Functional Limits for tasks assessed                      Exercises      General Comments        Pertinent Vitals/Pain Pain Assessment: 0-10 Pain Score: 8  Pain Location: neck Pain Descriptors / Indicators: Aching;Spasm Pain Intervention(s): Monitored during session;Repositioned (pt reported she wanted to wait until after therapy for pain meds)    Home Living                      Prior Function            PT Goals (current goals can now be found in the care plan section) Progress towards PT goals: Progressing toward goals;Goals met and updated - see care plan    Frequency  Min 3X/week    PT Plan Current plan remains appropriate    Co-evaluation PT/OT/SLP Co-Evaluation/Treatment: Yes Reason for Co-Treatment: For patient/therapist safety;Complexity of the patient's impairments (  multi-system involvement) PT goals addressed during session: Mobility/safety with mobility;Proper use of DME OT goals addressed during session: ADL's and self-care;Strengthening/ROM;Proper use of Adaptive equipment and DME     End of Session Equipment Utilized During Treatment: Gait belt Activity Tolerance: Patient tolerated treatment well Patient left: in chair;with call bell/phone within reach     Time: 1126-1213 PT Time Calculation (min) (ACUTE  ONLY): 47 min  Charges:  $Gait Training: 23-37 mins                    G Codes:      Scotti Kosta 01/07/2015, 1:25 PM  [facility] PT [phone]

## 2014-12-21 NOTE — Progress Notes (Signed)
Occupational Therapy Treatment Patient Details Name: Cynthia Rosales MRN: [id number] DOB: [birth year] Today's Date: 12/21/2014    History of present illness 34 year old female with a two-year history of progressive neck pain with radiation into both upper extremities with progressive numbness weakness and recent loss of bowel and bladder function and now s/p C3-6 Lami with tumor resection.  pt now developed Ileus.     OT comments  This 34 yo female making progress today with bed mobility, eating, and toilet transfers. Will continue to benefit from acute OT with follow up on CIR to get to a Mod I to Independent level.  Follow Up Recommendations  CIR    Equipment Recommendations   (TBD next venue)       Precautions / Restrictions Precautions Precautions: Fall;Cervical Cervical Brace: Soft collar;For comfort Restrictions Weight Bearing Restrictions: No       Mobility Bed Mobility Overal bed mobility: Needs Assistance Bed Mobility: Supine to Sit     Supine to sit: Min assist (with bed inflated until she was 3/4 of way up, HOB up and use of rails)        Transfers Overall transfer level: Needs assistance Equipment used: Rolling walker (2 wheeled) Transfers: Sit to/from Stand Sit to Stand: +2 physical assistance;Mod assist                  ADL Overall ADL's : Needs assistance/impaired Eating/Feeding: Modified independent;With adaptive utensils;Sitting Eating/Feeding Details (indicate cue type and reason): handled cup with lid (iniating with right hand and follow up with left hand), red tubing for utensils                     Toilet Transfer: Moderate assistance;+2 for physical assistance;Ambulation;RW Toilet Transfer Details (indicate cue type and reason): recliner>8 feet forward (sit in recliner) x2                            Cognition   Behavior During Therapy: WFL for tasks assessed/performed Overall Cognitive Status: Within Functional Limits  for tasks assessed                                    Pertinent Vitals/ Pain       Pain Assessment: 0-10 Pain Score: 8  Pain Location: neck Pain Descriptors / Indicators: Aching;Spasm Pain Intervention(s): Monitored during session;Repositioned (pt reported she wanted to wait until after therapy for pain meds)         Frequency Min 2X/week     Progress Toward Goals  OT Goals(current goals can now be found in the care plan section)  Progress towards OT goals: Progressing toward goals     Plan Discharge plan remains appropriate    Co-evaluation    PT/OT/SLP Co-Evaluation/Treatment: Yes Reason for Co-Treatment: For patient/therapist safety;Complexity of the patient's impairments (multi-system involvement)   OT goals addressed during session: ADL's and self-care;Strengthening/ROM;Proper use of Adaptive equipment and DME      End of Session     Activity Tolerance Patient tolerated treatment well   Patient Left with call bell/phone within reach (In W/C)   Nurse Communication Mobility status        Time: 2979-8921 OT Time Calculation (min): 47 min  Charges: OT General Charges $OT Visit: 1 Procedure OT Treatments $Self Care/Home Management : 23-37 mins  Almon Register [phone] 12/21/2014, 12:42 PM

## 2014-12-21 NOTE — Progress Notes (Signed)
Patient transferred from ICU. Patient alert and oriented x 4. Denies any complaint at this time. Will continue to monitor.

## 2014-12-21 NOTE — Progress Notes (Signed)
Doing much better today. Large bowel movement yesterday. Upper extremity symptoms continue to improve. Minimal right-sided weakness and sensory change. Lower extremity function also continued to improve. Right lower extremity strength much improved. Still with significant right-sided sensory change likely secondary to myelotomy and retraction. Wound healing well.  Beginning to make Row progress. We will mobilize further. Transfer to floor. Remove  NG-tube and start diet.

## 2014-12-22 DIAGNOSIS — K567 Ileus, unspecified: Secondary | ICD-10-CM

## 2014-12-22 LAB — GLUCOSE, CAPILLARY
Glucose-Capillary: 104 mg/dL — ABNORMAL HIGH (ref 70–99)
Glucose-Capillary: 109 mg/dL — ABNORMAL HIGH (ref 70–99)
Glucose-Capillary: 116 mg/dL — ABNORMAL HIGH (ref 70–99)
Glucose-Capillary: 137 mg/dL — ABNORMAL HIGH (ref 70–99)
Glucose-Capillary: 185 mg/dL — ABNORMAL HIGH (ref 70–99)
Glucose-Capillary: 208 mg/dL — ABNORMAL HIGH (ref 70–99)

## 2014-12-22 MED ORDER — INSULIN ASPART 100 UNIT/ML ~~LOC~~ SOLN
0.0000 [IU] | Freq: Three times a day (TID) | SUBCUTANEOUS | Status: DC
  Administered 2014-12-23 – 2014-12-24 (×4): 7 [IU] via SUBCUTANEOUS

## 2014-12-22 MED ORDER — HYDROMORPHONE HCL 1 MG/ML IJ SOLN
0.5000 mg | INTRAMUSCULAR | Status: DC | PRN
  Administered 2014-12-22 – 2014-12-24 (×9): 1 mg via INTRAVENOUS
  Filled 2014-12-22 (×10): qty 1

## 2014-12-22 MED ORDER — METOCLOPRAMIDE HCL 5 MG/ML IJ SOLN
10.0000 mg | Freq: Four times a day (QID) | INTRAMUSCULAR | Status: DC | PRN
Start: 2014-12-22 — End: 2014-12-24
  Administered 2014-12-22 – 2014-12-23 (×4): 10 mg via INTRAVENOUS
  Filled 2014-12-22 (×3): qty 2

## 2014-12-22 MED ORDER — HYDROMORPHONE HCL 2 MG PO TABS
2.0000 mg | ORAL_TABLET | ORAL | Status: DC | PRN
  Administered 2014-12-22 (×2): 4 mg via ORAL
  Administered 2014-12-22: 2 mg via ORAL
  Administered 2014-12-23: 4 mg via ORAL
  Filled 2014-12-22: qty 2
  Filled 2014-12-22: qty 1
  Filled 2014-12-22: qty 2
  Filled 2014-12-22: qty 1
  Filled 2014-12-22: qty 2

## 2014-12-22 MED ORDER — FAMOTIDINE 20 MG PO TABS
20.0000 mg | ORAL_TABLET | Freq: Two times a day (BID) | ORAL | Status: DC
  Administered 2014-12-22 – 2014-12-24 (×5): 20 mg via ORAL
  Filled 2014-12-22 (×5): qty 1

## 2014-12-22 NOTE — Progress Notes (Addendum)
PULMONARY / CRITICAL CARE MEDICINE   Name: Cynthia Rosales MRN: [id number] DOB: Feb 09, 1981    ADMISSION DATE:  12/10/2014 CONSULTATION DATE:  12/17/2014  REFERRING MD :  Dr. Annette Stable ([facility]os[facility]gery)  CHIEF COMPLAINT:  C4-C5 mass.  INITIAL PRESENTATION: 34 year old female with who was admitted from neurosurgical office 1/11. She c/o neck pain with radiation to BUE and loss of bowel/bladder function. MRI showed C4-C5 mass. Taken to OR 1/14.   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 This lesion enhances on   post-contrast sequences, consistent with a neoplastic process. 1/18 Abdominal Xray > Generalized bowel dilation, suspected ileus or enteritis.  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, microdissection 1/21 still no BM, working w/ PT. Ileus remains largest issue 1/21- big BM noted after enema 1/23 taking po well/ loose stools > changed reglan to prn   SUBJECTIVE:  Wants to eat, passing gas fine, no abd pain, loose stools     VITAL SIGNS: Temp:  [98.2 F (36.8 C)-98.7 F (37.1 C)] 98.3 F (36.8 C) (01/23 0500) Pulse Rate:  [60-112] 101 (01/23 0500) Resp:  [16-21] 16 (01/23 0500) BP: (115-168)/(51-97) 149/71 mmHg (01/23 0500) SpO2:  [99 %-100 %] 99 % (01/23 0500)   HEMODYNAMICS:   VENTILATOR SETTINGS:   INTAKE / OUTPUT:  Intake/Output Summary (Last 24 hours) at 12/22/14 3810 Last data filed at 12/21/14 1200  Gross per 24 hour  Intake  157.5 ml  Output    450 ml  Net -292.5 ml   PHYSICAL EXAMINATION: General:  Morbidly obese female in NAD Neuro:  Alert, oriented.calm HEENT:  Oropharynx clear  Cardiovascular:  s1 s2 Tachy mildular Lungs:  Clear Abdomen:  Obese, soft, non-tender.  No distention Musculoskeletal:  Decreased sensation to RUE, LLE, weakness rt, no  changes Skin:  Grossly intact  LABS:  CBC  Recent Labs Lab 12/19/14 0510 12/20/14 0500 12/21/14 0530  WBC 15.3* 15.7* 13.2*  HGB 10.1* 9.7* 9.2*  HCT 30.5* 29.7* 28.6*  PLT 528* 493* 401*   Coag's No results for input(s): APTT, INR in the last 168 hours. BMET  Recent Labs Lab 12/19/14 0510 12/20/14 0645 12/20/14 0828 12/21/14 0530  NA 134* 130*  --  132*  K 4.5 5.1  --  3.5  CL 106 101  --  101  CO2 22 21  --  26  BUN 31* 23  --  15  CREATININE 0.86 0.72  --  0.66  GLUCOSE 306* 701* 211* 231*   Electrolytes  Recent Labs Lab 12/18/14 0211 12/19/14 0510 12/20/14 0645 12/21/14 0530  CALCIUM 8.4 8.3* 7.8* 7.6*  MG 2.6* 2.9* 2.5  --   PHOS 2.8 2.9 4.4  --    Sepsis Markers No results for input(s): LATICACIDVEN, PROCALCITON, O2SATVEN in the last 168 hours. ABG No results for input(s): PHART, PCO2ART, PO2ART in the last 168 hours. Liver Enzymes  Recent Labs Lab 12/18/14 0211 12/20/14 0645 12/21/14 0530  AST 17 18 22   ALT 10 15 25   ALKPHOS 73 69 73  BILITOT 0.4 0.1* 0.5  ALBUMIN 2.5* 2.2* 2.1*   Cardiac Enzymes No results for input(s): TROPONINI, PROBNP in the last 168 hours. Glucose  Recent Labs Lab 12/21/14 1154 12/21/14 1621 12/21/14 2024 12/22/14 0027 12/22/14 0503 12/22/14 0800  GLUCAP 123* 165* 159*  104* 137* 109*    Imaging Dg Abd Portable 1v  12/21/2014   CLINICAL DATA:  Patient with ileus.  EXAM: PORTABLE ABDOMEN - 1 VIEW  COMPARISON:  12/19/2014  FINDINGS: Persistent gaseous distention of the colon. Additionally there are dilated loops of small bowel within the central abdomen measuring up to 3- 3.5 cm. Enteric tube tip and side-port project over the stomach. Visualized left lung base is unremarkable.  IMPRESSION: Persistent gaseous distention of the colon. Additionally there is increasing small bowel dilatation. This may reflect worsening ileus.   Electronically Signed   By: Lovey Newcomer M.D.   On: 12/21/2014 10:27     ASSESSMENT  / PLAN:  PULMONARY A: H/o Asthma Concern for OHS ATX P:   PRN Xopenex Mobilize as tol    CARDIOVASCULAR A:  Sinus tachycardia -mild, component bowel sequestration from ileus, resolved H/o HTN  P:   PRN labetalol     RENAL A:   Mild hyponatremia   - urine na , osm c/w siadh Rec continue to monitor as this is very mild   GASTROINTESTINAL A:   Ileus, suspect due to pain medications, immobility Nutrition S/p large BM and gas 1/21> resolved am 1/23  P:   Regular diet/ changed reglan to prn  Added pepcid 1/23  since still on Dex   HEMATOLOGIC A:   Sickle cell disease ruled out 09/05/13  By hgb electropheresis P:  Heparin for DVT ppx    INFECTIOUS A:   No evidence for infection     ENDOCRINE A:   DM 2  P:   CBG monitoring and SSI to q 4 hours while NPO  NEUROLOGIC A:   C4-C5 mass s/p resection with weakness and sensation loss to extremitites >surgical path:Histologic type: Ependymoma, conventional type  P:   Per NS   FAMILY  - none at bedside    PCCM service will be available prn but no further f/u needed from our perspective      Christinia Gully, MD Pulmonary and [place] [phone] After 5:30 PM or weekends, call [phone]

## 2014-12-22 NOTE — Progress Notes (Signed)
Overall improving. Pain better controlled. Still with some muscle spasms. Upper extremity strength and dexterity much improved from preop. Still with significant lower extremity weakness right greater than left. Still with lower extremity sensory abnormalities at would be expected following her myelotomy. Wound healing well.  Overall progressing well. Patient ready for inpatient rehabilitation whenever bed available.

## 2014-12-23 LAB — GLUCOSE, CAPILLARY
Glucose-Capillary: 201 mg/dL — ABNORMAL HIGH (ref 70–99)
Glucose-Capillary: 245 mg/dL — ABNORMAL HIGH (ref 70–99)
Glucose-Capillary: 250 mg/dL — ABNORMAL HIGH (ref 70–99)
Glucose-Capillary: 220 mg/dL — ABNORMAL HIGH (ref 70–99)

## 2014-12-23 MED ORDER — ALUM & MAG HYDROXIDE-SIMETH 200-200-20 MG/5ML PO SUSP
30.0000 mL | Freq: Four times a day (QID) | ORAL | Status: DC | PRN
  Administered 2014-12-23: 30 mL via ORAL
  Filled 2014-12-23: qty 30

## 2014-12-23 NOTE — Progress Notes (Signed)
Pt has had N/V during the night. Zofran and Reglan has been given to pt. Will continue to monitor.

## 2014-12-23 NOTE — Progress Notes (Signed)
Name better controlled. Wound healing well. Neurologic exam continues to improve. Still with significant lower extremity weakness right greater than left.  Doing very well following resection of intramedullary spinal cord tumor. Patient ready to progress activities in a rehabilitation setting.

## 2014-12-24 ENCOUNTER — Inpatient Hospital Stay (HOSPITAL_COMMUNITY)
Admission: RE | Admit: 2014-12-24 | Discharge: 2015-01-02 | DRG: 052 | Disposition: A | Discharge status: Home / Self Care | Payer: Medicaid Other | Source: Intra-hospital | Attending: Physical Medicine & Rehabilitation | Admitting: Physical Medicine & Rehabilitation

## 2014-12-24 ENCOUNTER — Inpatient Hospital Stay (HOSPITAL_COMMUNITY): Payer: Medicaid Other

## 2014-12-24 DIAGNOSIS — D571 Sickle-cell disease without crisis: Secondary | ICD-10-CM

## 2014-12-24 DIAGNOSIS — E871 Hypo-osmolality and hyponatremia: Secondary | ICD-10-CM | POA: Diagnosis not present

## 2014-12-24 DIAGNOSIS — K560 Paralytic ileus: Secondary | ICD-10-CM

## 2014-12-24 DIAGNOSIS — G629 Polyneuropathy, unspecified: Secondary | ICD-10-CM | POA: Diagnosis not present

## 2014-12-24 DIAGNOSIS — Z794 Long term (current) use of insulin: Secondary | ICD-10-CM | POA: Diagnosis not present

## 2014-12-24 DIAGNOSIS — Z79899 Other long term (current) drug therapy: Secondary | ICD-10-CM | POA: Diagnosis not present

## 2014-12-24 DIAGNOSIS — C720 Malignant neoplasm of spinal cord: Secondary | ICD-10-CM | POA: Diagnosis not present

## 2014-12-24 DIAGNOSIS — R1110 Vomiting, unspecified: Secondary | ICD-10-CM

## 2014-12-24 DIAGNOSIS — I10 Essential (primary) hypertension: Secondary | ICD-10-CM | POA: Diagnosis not present

## 2014-12-24 DIAGNOSIS — S14104A Unspecified injury at C4 level of cervical spinal cord, initial encounter: Secondary | ICD-10-CM | POA: Diagnosis present

## 2014-12-24 DIAGNOSIS — E1169 Type 2 diabetes mellitus with other specified complication: Secondary | ICD-10-CM

## 2014-12-24 DIAGNOSIS — K567 Ileus, unspecified: Secondary | ICD-10-CM | POA: Diagnosis present

## 2014-12-24 DIAGNOSIS — G8252 Quadriplegia, C1-C4 incomplete: Secondary | ICD-10-CM | POA: Diagnosis present

## 2014-12-24 DIAGNOSIS — R609 Edema, unspecified: Secondary | ICD-10-CM | POA: Diagnosis not present

## 2014-12-24 DIAGNOSIS — R112 Nausea with vomiting, unspecified: Secondary | ICD-10-CM

## 2014-12-24 DIAGNOSIS — E669 Obesity, unspecified: Secondary | ICD-10-CM

## 2014-12-24 DIAGNOSIS — G8250 Quadriplegia, unspecified: Secondary | ICD-10-CM | POA: Diagnosis present

## 2014-12-24 DIAGNOSIS — K9189 Other postprocedural complications and disorders of digestive system: Secondary | ICD-10-CM

## 2014-12-24 DIAGNOSIS — D497 Neoplasm of unspecified behavior of endocrine glands and other parts of nervous system: Secondary | ICD-10-CM

## 2014-12-24 DIAGNOSIS — Z6841 Body Mass Index (BMI) 40.0 and over, adult: Secondary | ICD-10-CM | POA: Diagnosis not present

## 2014-12-24 DIAGNOSIS — E119 Type 2 diabetes mellitus without complications: Secondary | ICD-10-CM | POA: Diagnosis not present

## 2014-12-24 HISTORY — DX: Abscess of bursa, left elbow: M71.022

## 2014-12-24 LAB — GLUCOSE, CAPILLARY
Glucose-Capillary: 228 mg/dL — ABNORMAL HIGH (ref 70–99)
Glucose-Capillary: 199 mg/dL — ABNORMAL HIGH (ref 70–99)
Glucose-Capillary: 216 mg/dL — ABNORMAL HIGH (ref 70–99)
Glucose-Capillary: 258 mg/dL — ABNORMAL HIGH (ref 70–99)

## 2014-12-24 MED ORDER — METOCLOPRAMIDE HCL 5 MG PO TABS
5.0000 mg | ORAL_TABLET | Freq: Three times a day (TID) | ORAL | Status: DC
  Administered 2014-12-24 – 2015-01-02 (×35): 5 mg via ORAL
  Filled 2014-12-24 (×40): qty 1

## 2014-12-24 MED ORDER — ALUM & MAG HYDROXIDE-SIMETH 200-200-20 MG/5ML PO SUSP: 30.0000 mL | Freq: Four times a day (QID) | ORAL | Status: DC | PRN

## 2014-12-24 MED ORDER — ONDANSETRON HCL 4 MG PO TABS
4.0000 mg | ORAL_TABLET | Freq: Four times a day (QID) | ORAL | Status: DC | PRN
Start: 2014-12-24 — End: 2015-01-02
  Administered 2014-12-26 – 2014-12-29 (×5): 4 mg via ORAL
  Filled 2014-12-24 (×5): qty 1

## 2014-12-24 MED ORDER — BACLOFEN 5 MG HALF TABLET
5.0000 mg | ORAL_TABLET | Freq: Four times a day (QID) | ORAL | Status: DC
  Administered 2014-12-24 – 2015-01-02 (×34): 5 mg via ORAL
  Filled 2014-12-24 (×44): qty 1

## 2014-12-24 MED ORDER — INSULIN GLARGINE 100 UNIT/ML ~~LOC~~ SOLN
10.0000 [IU] | Freq: Every day | SUBCUTANEOUS | Status: DC
  Administered 2014-12-24 – 2014-12-25 (×2): 10 [IU] via SUBCUTANEOUS
  Filled 2014-12-24 (×3): qty 0.1

## 2014-12-24 MED ORDER — INSULIN ASPART 100 UNIT/ML ~~LOC~~ SOLN
0.0000 [IU] | Freq: Three times a day (TID) | SUBCUTANEOUS | Status: DC
  Administered 2014-12-24: 7 [IU] via SUBCUTANEOUS
  Administered 2014-12-25: 4 [IU] via SUBCUTANEOUS
  Administered 2014-12-25: 7 [IU] via SUBCUTANEOUS
  Administered 2014-12-25: 3 [IU] via SUBCUTANEOUS
  Administered 2014-12-26: 7 [IU] via SUBCUTANEOUS
  Administered 2014-12-27: 11 [IU] via SUBCUTANEOUS
  Administered 2014-12-28: 4 [IU] via SUBCUTANEOUS
  Administered 2014-12-28: 3 [IU] via SUBCUTANEOUS
  Administered 2014-12-28: 7 [IU] via SUBCUTANEOUS
  Administered 2014-12-29 – 2014-12-30 (×2): 4 [IU] via SUBCUTANEOUS
  Administered 2014-12-30: 7 [IU] via SUBCUTANEOUS
  Administered 2014-12-31 (×2): 4 [IU] via SUBCUTANEOUS
  Administered 2015-01-01: 3 [IU] via SUBCUTANEOUS
  Administered 2015-01-02: 4 [IU] via SUBCUTANEOUS

## 2014-12-24 MED ORDER — OXYCODONE HCL 5 MG PO TABS
10.0000 mg | ORAL_TABLET | ORAL | Status: DC | PRN
  Administered 2014-12-24 – 2014-12-27 (×9): 15 mg via ORAL
  Administered 2014-12-27: 10 mg via ORAL
  Administered 2014-12-27: 15 mg via ORAL
  Administered 2014-12-27: 10 mg via ORAL
  Administered 2014-12-28 – 2015-01-02 (×19): 15 mg via ORAL
  Filled 2014-12-24 (×26): qty 3
  Filled 2014-12-24: qty 2
  Filled 2014-12-24 (×6): qty 3

## 2014-12-24 MED ORDER — ONDANSETRON HCL 4 MG/2ML IJ SOLN: 4.0000 mg | Freq: Four times a day (QID) | INTRAMUSCULAR | Status: DC | PRN

## 2014-12-24 MED ORDER — FAMOTIDINE 20 MG PO TABS
20.0000 mg | ORAL_TABLET | Freq: Two times a day (BID) | ORAL | Status: DC
  Administered 2014-12-24 – 2015-01-02 (×18): 20 mg via ORAL
  Filled 2014-12-24 (×20): qty 1

## 2014-12-24 MED ORDER — FLEET ENEMA 7-19 GM/118ML RE ENEM
1.0000 | ENEMA | Freq: Every day | RECTAL | Status: DC | PRN
  Administered 2014-12-28 – 2015-01-02 (×2): 1 via RECTAL
  Filled 2014-12-24: qty 1

## 2014-12-24 MED ORDER — CHLORHEXIDINE GLUCONATE 0.12 % MT SOLN
15.0000 mL | Freq: Two times a day (BID) | OROMUCOSAL | Status: DC
  Administered 2014-12-24 – 2014-12-26 (×4): 15 mL via OROMUCOSAL
  Filled 2014-12-24 (×9): qty 15

## 2014-12-24 MED ORDER — LEVALBUTEROL HCL 0.63 MG/3ML IN NEBU: 0.6300 mg | INHALATION_SOLUTION | Freq: Four times a day (QID) | RESPIRATORY_TRACT | Status: DC | PRN

## 2014-12-24 MED ORDER — BISACODYL 10 MG RE SUPP
10.0000 mg | Freq: Every day | RECTAL | Status: DC | PRN
  Administered 2014-12-25: 10 mg via RECTAL
  Filled 2014-12-24 (×2): qty 1

## 2014-12-24 MED ORDER — METOPROLOL TARTRATE 12.5 MG HALF TABLET
12.5000 mg | ORAL_TABLET | Freq: Two times a day (BID) | ORAL | Status: DC
  Administered 2014-12-24 – 2015-01-02 (×18): 12.5 mg via ORAL
  Filled 2014-12-24 (×20): qty 1

## 2014-12-24 NOTE — Progress Notes (Signed)
Inpatient Diabetes Program Recommendations  AACE/ADA: New Consensus Statement on Inpatient Glycemic Control (2013)  Target Ranges:  Prepandial:   less than 140 mg/dL      Peak postprandial:   less than 180 mg/dL (1-2 hours)      Critically ill patients:  140 - 180 mg/dL   Reason for Visit: Results for LAWANA, HARTZELL (MRN [id number]) as of 12/24/2014 14:23  Ref. Range 12/23/2014 11:27 12/23/2014 16:24 12/23/2014 21:48 12/24/2014 07:09 12/24/2014 11:21  Glucose-Capillary Latest Range: 70-99 mg/dL 245 (H) 250 (H) 220 (H) 228 (H) 199 (H)   Diabetes history: Type 2 diabetes Outpatient Diabetes medications: Lantus 50 units bid, Novolog 20 units tid with meals, Metformin 1000 mg bid Current orders for Inpatient glycemic control:  Novolog resistant tid with meals  May consider restarting a portion of home dose of Lantus.  Consider Lantus 25 units daily.  Thanks, Adah Perl, RN, BC-ADM Inpatient Diabetes Coordinator Pager [phone]

## 2014-12-24 NOTE — Progress Notes (Signed)
Physical Therapy Treatment Patient Details Name: Cynthia Rosales MRN: [id number] DOB: 03-20-1981 Today's Date: 12/24/2014    History of Present Illness 34 year old female with a two-year history of progressive neck pain with radiation into both upper extremities with progressive numbness weakness and recent loss of bowel and bladder function and now s/p C3-6 Lami with tumor resection.  pt now developed Ileus.      PT Comments    Pt very motivated and eager to participate despite bouts of nausea. Required 2 minutes at EOB prior to stand due to nausea. Walking much better than 12/21/14, however RLE remains weaker than LLE with knee instability. Feel pt will do well with more intensive therapies.   Follow Up Recommendations  CIR     Equipment Recommendations  Rolling walker with 5" wheels (bariatric)    Recommendations for Other Services       Precautions / Restrictions Precautions Precautions: Fall;Cervical Cervical Brace: Soft collar;For comfort (did not use during PT) Restrictions Weight Bearing Restrictions: No    Mobility  Bed Mobility Overal bed mobility: Needs Assistance Bed Mobility: Supine to Sit     Supine to sit: Min guard;HOB elevated     General bed mobility comments: +rail, HOB 30 (not lowered due to recent nausea); incr effort and time  Transfers Overall transfer level: Needs assistance Equipment used: Rolling walker (2 wheeled) (bariatric) Transfers: Sit to/from Stand Sit to Stand: Min assist;+2 physical assistance;+2 safety/equipment        General transfer comment: vc for safe use of RW; assist with anterior weight shift and hip/trunk extension  Ambulation/Gait Ambulation/Gait assistance: Min assist;+2 safety/equipment Ambulation Distance (Feet): 60 Feet Assistive device: Rolling walker (2 wheeled) (bariatric) Gait Pattern/deviations: Step-through pattern;Decreased stance time - right;Decreased step length - right;Decreased dorsiflexion -  right Gait velocity: decr Gait velocity interpretation: Below normal speed for age/gender General Gait Details: advancing RLE independently; vc for shortening Lt step length to decr risk of Rt knee buckling or hyperextending; slight buckle and catch x 2 with good UE support    Stairs            Wheelchair Mobility    Modified Rankin (Stroke Patients Only)       Balance                                    Cognition Arousal/Alertness: Awake/alert Behavior During Therapy: WFL for tasks assessed/performed Overall Cognitive Status: Within Functional Limits for tasks assessed                      Exercises General Exercises - Lower Extremity Ankle Circles/Pumps: AROM;Both;10 reps (emphasized heel cord stretch x 30 sec after AROM)    General Comments General comments (skin integrity, edema, etc.): pt reports she does not recall what prior PT instructed her to do re: exercises      Pertinent Vitals/Pain Pain Assessment: 0-10 Pain Score: 8  Pain Location: neck Pain Descriptors / Indicators: Aching Pain Intervention(s): Limited activity within patient's tolerance;Monitored during session;Repositioned    Home Living                      Prior Function            PT Goals (current goals can now be found in the care plan section) Acute Rehab PT Goals Patient Stated Goal: be able to walk Progress towards PT goals: Progressing toward goals (  goals not updated due to d/c to CIR today)    Frequency  Min 3X/week    PT Plan Current plan remains appropriate    Co-evaluation             End of Session Equipment Utilized During Treatment:  (2nd person close follow with chair) Activity Tolerance: Patient tolerated treatment well Patient left: in chair;with call bell/phone within reach     Time: 6314-9702 PT Time Calculation (min) (ACUTE ONLY): 26 min  Charges:  $Gait Training: 23-37 mins                    G Codes:       Cynthia Rosales 2015/01/01, 1:54 PM Pager [phone]

## 2014-12-24 NOTE — Progress Notes (Signed)
Occupational Therapy Treatment Patient Details Name: Cynthia Rosales MRN: [id number] DOB: 02/21/1981 Today's Date: 12/24/2014    History of present illness 34 year old female with a two-year history of progressive neck pain with radiation into both upper extremities with progressive numbness weakness and recent loss of bowel and bladder function and now s/p C3-6 Lami with tumor resection.  pt now developed Ileus.     OT comments  Patient is progressing towards goals, continue plan of care for now. Patient eager to transfer > CIR.   Follow Up Recommendations  CIR    Equipment Recommendations   (tbd)    Recommendations for Other Services  None at this time.    Precautions / Restrictions Precautions Precautions: Fall;Cervical Cervical Brace: Soft collar;For comfort (pt did not need collor during OT treat) Restrictions Weight Bearing Restrictions: No       Mobility Bed Mobility Overal bed mobility: Needs Assistance Bed Mobility: Supine to Sit     Supine to sit: Min assist;HOB elevated     General bed mobility comments: using bed rails and verbal cues for technique  Transfers Overall transfer level: Needs assistance Equipment used: Rolling walker (2 wheeled)   Sit to Stand: Mod assist Stand pivot transfers: Mod assist       General transfer comment: verbal cues for hand placement and increased assistance with hips        ADL   General ADL Comments: Patient motivated to go to CIR. Enouraged patient to use red foam to build up utensils and stressball to increase grip strength.                 Cognition   Behavior During Therapy: WFL for tasks assessed/performed Overall Cognitive Status: Within Functional Limits for tasks assessed      Exercises General Exercises - Upper Extremity Shoulder Flexion: AAROM;Right;10 reps (> 90* secondary to pain) Elbow Flexion: AROM;Right;10 reps Elbow Extension: AROM;Right;10 reps Wrist Flexion: AROM;Right;10 reps Digit  Composite Flexion: AROM;Right;10 reps           Pertinent Vitals/ Pain       Pain Assessment: 0-10 Pain Score: 8  Pain Location: neck Pain Descriptors / Indicators: Aching Pain Intervention(s): Monitored during session;Repositioned         Frequency Min 2X/week     Progress Toward Goals  OT Goals(current goals can now be found in the care plan section)  Progress towards OT goals: Progressing toward goals     Plan Discharge plan remains appropriate       End of Session Equipment Utilized During Treatment: Gait belt;Rolling walker   Activity Tolerance Patient tolerated treatment well   Patient Left with call bell/phone within reach   Nurse Communication Mobility status (need to use [facility])    Time: 9937-1696 OT Time Calculation (min): 23 min  Charges: OT General Charges $OT Visit: 1 Procedure OT Treatments $Self Care/Home Management : 8-22 mins $Therapeutic Exercise: 8-22 mins  Dorethia Jeanmarie , MS, OTR/L, CLT Pager: [phone]  12/24/2014, 1:32 PM

## 2014-12-24 NOTE — Discharge Summary (Signed)
Physician Discharge Summary  Patient ID: Cynthia Rosales MRN: [id number] DOB/AGE: [birth year] 34 y.o.  Admit date: 12/10/2014 Discharge date: 12/24/2014  Admission Diagnoses:  Discharge Diagnoses:  Principal Problem:   Spinal cord tumor Active Problems:   Intestinal occlusion   Atelectasis   Bowel obstruction   Encounter for central line care   Encounter for nasogastric (NG) tube placement   Ileus   Ileus of unspecified type   Discharged Condition: good  Hospital Course: Patient admitted to the hospital for evaluation and treatment of an intramedullary spinal cord tumor. Patient subsequently underwent cervical laminectomy and resection of her intramedullary tumor. Pathology consistent with ependymoma. Patient has progressed well postoperatively. She is ready for transfer for further inpatient rehabilitation.  Consults:   Significant Diagnostic Studies:   Treatments:   Discharge Exam: Blood pressure 119/65, pulse 104, temperature 98.2 F (36.8 C), temperature source Oral, resp. rate 20, height 5\' 4"  (1.626 m), weight 158.2 kg (348 lb 12.3 oz), last menstrual period 12/16/2014, SpO2 100 %, not currently breastfeeding. Awake and alert. Oriented and appropriate. Motor examination 4+ over 5 left upper extremity. For over 5 right upper extremity. For over 5 left lower extremity 3 over 5 right lower extremity. Significant sensory abnormality right lower extremity. Wound clean and dry. Chest and abdomen and 9.  Disposition: 01-Home or Self Care     Medication List    TAKE these medications        hydrOXYzine 25 MG capsule  Commonly known as:  VISTARIL  Take 25 mg by mouth at bedtime.     insulin aspart 100 UNIT/ML injection  Commonly known as:  novoLOG  Inject 20 Units into the skin 3 (three) times daily.     insulin glargine 100 UNIT/ML injection  Commonly known as:  LANTUS  Inject 50 Units into the skin 2 (two) times daily.     losartan 25 MG tablet  Commonly known as:   COZAAR  Take 25 mg by mouth daily.     metFORMIN 1000 MG tablet  Commonly known as:  GLUCOPHAGE  Take 1,000 mg by mouth 2 (two) times daily.     metoprolol tartrate 25 MG tablet  Commonly known as:  LOPRESSOR  Take 25 mg by mouth daily.     topiramate 25 MG tablet  Commonly known as:  TOPAMAX  Take 2 tablets (50 mg total) by mouth 2 (two) times daily.         Signed: Aggie Douse A 12/24/2014, 12:10 PM

## 2014-12-24 NOTE — H&P (Signed)
Physical Medicine and Rehabilitation Admission H&P   Chief Complaint  Patient presents with  . 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     HPI: Cynthia Rosales is a 34 y.o. female with history of HTN, DM, morbid obesity, SSD, progressive neck pain with radiation to BUE and recent MRI with C4-C5 expansile mass. She was admitted for further work up via [place] office on 12/10/14 due reports of recent loss of B/B function with progressive quadreparesis and was started on IV steroids. 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 She was taken to OR on 12/13/13 for C3-C5 laminectomy with resection of intramedullary tumor by Dr. Annette Stable. Pathology positive for ependymoma. Post op course complicated by abdominal pian due to paralytic ileus or enteritis and CCM consulted for assistacne. She was made NPO with TNA for nutritional supplement and NGT placed for decompression. Hypotension treated with IVF. Follow up KUB with increasing small bowel dilatation but patient has had improvement in GI symptoms. NGT d/c and patient advanced to regular diet yesterday and has had some N/V per reports. She has had improvement in tetraplegia but continues to have RLE > LLE weakness affecting mobility. Therapy has been ongoing with patient showing good motivation and steady progress. CIR was recommended for follow up therapy and patient medically ready today for admission.     Review of Systems  HENT: Negative for hearing loss.  Eyes: Negative for blurred vision and double vision.  Respiratory: Negative for shortness of breath and wheezing.  Cardiovascular: Negative for chest pain and palpitations.  Gastrointestinal: Positive for nausea, vomiting and diarrhea. Negative for abdominal pain.  Musculoskeletal: Positive for myalgias (all over due to sickle cell pain) and back pain.  Neurological: Positive for dizziness, tingling, sensory change,  focal weakness, weakness and headaches (chronic).      Past Medical History  Diagnosis Date  . Hypertension   . Asthma   . Type II diabetes mellitus   . History of blood transfusion     "after I had one of my kids"  . Sickle cell disease   . Anemia   . Depression   . Ovarian cyst   . Renal disorder     kidney stones    Past Surgical History  Procedure Laterality Date  . Cesarean section  2000; 2007; 2011  . Dilation and curettage of uterus    . Appendectomy  2013  . Reduction mammaplasty Bilateral 1998  . Laminectomy N/A 12/13/2014    Procedure: CERVICAL LAMINECTOMY FOR INTRADURAL TUMOR; Surgeon: Charlie Pitter, MD; Location: [facility]; Service: Neurosurgery; Laterality: N/A; posterior    Family History  Problem Relation Age of Onset  . Breast cancer Maternal Aunt   . Ovarian cancer Maternal Grandmother   . Breast cancer Maternal Aunt   . Migraines Neg Hx     Social History: Lives with boyfriend and three children--moved to [place] about a year ago. Independent without AD prior to admission. Disabled due to sickle cell disease. She reports that she has never smoked. She has never used smokeless tobacco. She reports that she drinks alcohol. She reports that she does not use illicit drugs.    Allergies  Allergen Reactions  . Amoxicillin Hives and Rash    Medications Prior to Admission  Medication Sig Dispense Refill  . hydrOXYzine (VISTARIL) 25 MG capsule Take 25 mg by mouth at bedtime.    . insulin aspart (NOVOLOG) 100 UNIT/ML  injection Inject 20 Units into the skin 3 (three) times daily.     . insulin glargine (LANTUS) 100 UNIT/ML injection Inject 50 Units into the skin 2 (two) times daily.     Marland Kitchen losartan (COZAAR) 25 MG tablet Take 25 mg by mouth daily.    . metFORMIN (GLUCOPHAGE) 1000 MG tablet Take 1,000 mg by mouth 2 (two) times  daily.    . metoprolol tartrate (LOPRESSOR) 25 MG tablet Take 25 mg by mouth daily.    Marland Kitchen topiramate (TOPAMAX) 25 MG tablet Take 2 tablets (50 mg total) by mouth 2 (two) times daily. 120 tablet 3    Home: Home Living Family/patient expects to be discharged to:: Inpatient rehab Living Arrangements: Spouse/significant other, Children  Functional History: Prior Function Level of Independence: Needs assistance ADL's / Homemaking Assistance Needed: Boyfriend had started A with lower body dressing and Homemaking.   Functional Status:  Mobility: Bed Mobility Overal bed mobility: Needs Assistance Bed Mobility: Supine to Sit Supine to sit: Min assist (with bed inflated until she was 3/4 of way up, HOB up and use of rails) Sit to supine: Max assist, +2 for physical assistance General bed mobility comments: Verbal cues for technique and support of trunk to move hands for better leverage/support. Transfers Overall transfer level: Needs assistance Equipment used: Rolling walker (2 wheeled) Transfers: Sit to/from Stand Sit to Stand: +2 physical assistance, Mod assist Stand pivot transfers: Mod assist, +2 physical assistance General transfer comment: Verbal cues for hand placement. Assist to bring hips up. Ambulation/Gait Ambulation/Gait assistance: +2 physical assistance, Min assist, Mod assist Ambulation Distance (Feet): 10 Feet (x 2) Assistive device: Rolling walker (2 wheeled) Gait Pattern/deviations: Step-through pattern, Decreased step length - right, Decreased step length - left Gait velocity interpretation: Below normal speed for age/gender General Gait Details: Intermittent assist to advance RLE further. Pt with rt knee in hyperextension for support. If rt knee not in hyperextension tends to give out with stance. Manual support of rt knee.    ADL: ADL Overall ADL's : Needs assistance/impaired Eating/Feeding: Modified independent, With adaptive utensils,  Sitting Eating/Feeding Details (indicate cue type and reason): handled cup with lid (iniating with right hand and follow up with left hand), red tubing for utensils Grooming: Moderate assistance, Bed level Upper Body Bathing: Maximal assistance, Bed level (increased body habitus) Lower Body Bathing: Total assistance, Bed level Upper Body Dressing : Total assistance, Bed level Lower Body Dressing: Total assistance, Bed level Toilet Transfer: Moderate assistance, +2 for physical assistance, Ambulation, RW Toilet Transfer Details (indicate cue type and reason): recliner>8 feet forward (sit in recliner) x2  Cognition: Cognition Overall Cognitive Status: Within Functional Limits for tasks assessed Orientation Level: Oriented X4 Cognition Arousal/Alertness: Awake/alert Behavior During Therapy: WFL for tasks assessed/performed Overall Cognitive Status: Within Functional Limits for tasks assessed  Physical Exam: Blood pressure 119/65, pulse 104, temperature 98.2 F (36.8 C), temperature source Oral, resp. rate 20, height 5\' 4"  (1.626 m), weight 158.2 kg (348 lb 12.3 oz), last menstrual period 12/16/2014, SpO2 100 %, not currently breastfeeding. Physical Exam  Nursing note and vitals reviewed. Constitutional: She is oriented to person, place, and time. She appears well-developed and well-nourished.  Morbidly obese female. No distress HENT: oral mucosa moist, mild white coating on tongue Head: Normocephalic and atraumatic.  Eyes: Conjunctivae are normal. Pupils are equal, round, and reactive to light.  Neck: Normal range of motion. Neck supple.  Cardiovascular: Normal rate and regular rhythm.  Respiratory: Effort normal and breath sounds normal. No  respiratory distress. She has no wheezes.  GI: Soft. She exhibits no distension. Bowel sounds are decreased. There is no tenderness.  Musculoskeletal: She exhibits no edema or tenderness.  Neurological: She is alert and oriented to person, place,  and time.  Speech clear. Follows basic commands without difficulty.cognitively intact.  Right upper ext 4/5 delt, tricep, bicep, hand. LUE: 4+ to 5/5 prox to distal. RLE: 1+ to 2/5 HF and KE and 3/5 ankle. LLE 3 hf, ke and 4/5 ankle. Sensation 1/2 RUE and RLE---senses pinch. Has 1+ to 2/2 trunk and left upper and lower. DTR's absent  Skin: Skin is warm and dry. Surgical site clean with steristrips. Psych: pt is pleasant and cooperative.    Lab Results Last 48 Hours    Results for orders placed or performed during the hospital encounter of 12/10/14 (from the past 48 hour(s))  Glucose, capillary Status: Abnormal   Collection Time: 12/22/14 1:26 PM  Result Value Ref Range   Glucose-Capillary 185 (H) 70 - 99 mg/dL   Comment 1 Notify RN   Glucose, capillary Status: Abnormal   Collection Time: 12/22/14 4:28 PM  Result Value Ref Range   Glucose-Capillary 208 (H) 70 - 99 mg/dL   Comment 1 Notify RN   Glucose, capillary Status: Abnormal   Collection Time: 12/22/14 9:43 PM  Result Value Ref Range   Glucose-Capillary 116 (H) 70 - 99 mg/dL  Glucose, capillary Status: Abnormal   Collection Time: 12/23/14 6:40 AM  Result Value Ref Range   Glucose-Capillary 201 (H) 70 - 99 mg/dL   Comment 1 Documented in Chart    Comment 2 Notify RN   Glucose, capillary Status: Abnormal   Collection Time: 12/23/14 11:27 AM  Result Value Ref Range   Glucose-Capillary 245 (H) 70 - 99 mg/dL   Comment 1 Notify RN    Comment 2 Documented in Chart   Glucose, capillary Status: Abnormal   Collection Time: 12/23/14 4:24 PM  Result Value Ref Range   Glucose-Capillary 250 (H) 70 - 99 mg/dL   Comment 1 Notify RN    Comment 2 Documented in Chart   Glucose, capillary Status: Abnormal   Collection Time: 12/23/14 9:48 PM  Result Value Ref Range   Glucose-Capillary 220 (H) 70 - 99  mg/dL   Comment 1 Documented in Chart    Comment 2 Notify RN   Glucose, capillary Status: Abnormal   Collection Time: 12/24/14 7:09 AM  Result Value Ref Range   Glucose-Capillary 228 (H) 70 - 99 mg/dL   Comment 1 Documented in Chart    Comment 2 Notify RN       Imaging Results (Last 48 hours)    No results found.       Medical Problem List and Plan: 1. 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 DVT Prophylaxis/Anticoagulation: Pharmaceutical: Lovenox 3. Pain Management: oxycodone, tylenol  -baclofen for spasms 4. Mood: LCSW to follow for revaluation and support.  5. Neuropsych: This patient is capable of making decisions on her own behalf. 6. Skin/Wound Care: Monitor wound daily. Routine pressure relief measures.  7. Fluids/Electrolytes/Nutrition: Monitor I/O. Check electrolytes in am and maintain adequate potassium levels. . Will downgrade diet to full liquids for now.  8. Ileus: Will check follow up KUB. Will schedule Reglan ac/hs and monitor for diet tolerance. Schedule suppository.  9. KDT:OIZT monitor every 8 hours. Resume metoprolol to help with BP as well as heart rate control.  10. DM type 2: Was on lantus  50 units with novolog tid and metformin at home. Will monitor BS with achs checks. Resume lantus 10 units at bedtime and titrate to home dose as diet advanced. .  11. Hyponatremia: Multifactorial due to diet, N/V. Will recheck in am.     Post Admission Physician Evaluation: 1. 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 2. Patient is admitted to receive collaborative, interdisciplinary care between the physiatrist, rehab nursing staff, and therapy team. 3. Patient's level of medical complexity and substantial therapy needs in context of that medical necessity cannot be provided at a lesser intensity of care such as a SNF. 4. Patient has experienced  substantial functional loss from his/her baseline which was documented above under the "Functional History" and "Functional Status" headings. Judging by the patient's diagnosis, physical exam, and functional history, the patient has potential for functional progress which will result in measurable gains while on inpatient rehab. These gains will be of substantial and practical use upon discharge in facilitating mobility and self-care at the household level. 5. Physiatrist will provide 24 hour management of medical needs as well as oversight of the therapy plan/treatment and provide guidance as appropriate regarding the interaction of the two. 6. 24 hour rehab nursing will assist with bladder management, bowel management, safety, skin/wound care, disease management, medication administration, pain management and patient education and help integrate therapy concepts, techniques,education, etc. 7. PT will assess and treat for/with: Lower extremity strength, range of motion, stamina, balance, functional mobility, safety, adaptive techniques and equipment, NMR, pain mgt, ego support. Goals are: supervision to mod I. 8. OT will assess and treat for/with: ADL's, functional mobility, safety, upper extremity strength, adaptive techniques and equipment, NMR, pain mgt, community reintegration. Goals are: mod I to min assist. Therapy may proceed with showering this patient. 9. SLP will assess and treat for/with: n/a. Goals are: n/a. 10. Case Management and Social Worker will assess and treat for psychological issues and discharge planning. 11. Team conference will be held weekly to assess progress toward goals and to determine barriers to discharge. 12. Patient will receive at least 3 hours of therapy per day at least 5 days per week. 13. ELOS: 18-24 days  14. Prognosis: excellent     Meredith Staggers, MD, [place] [facility] 12/24/2014

## 2014-12-24 NOTE — Progress Notes (Signed)
Pt admitted at 1525. Rehab booklet and process given to pt and safety plan reviewed with verbal understanding. Pt in bed call bell within reach, BA in place.

## 2014-12-24 NOTE — Progress Notes (Signed)
Discharge orders received. Pt for discharge to inpatient rehabilitation today. Central line access maintained; one port infusing. Adhesive strips clean, dry, intact to back of neck. Pt given discharge instructions with verbalized understanding. Family aware of discharge. Staff transferred pt to 4W24 via wheelchair. Report called to receiving RN before transfer. All questions answered.

## 2014-12-24 NOTE — PMR Pre-admission (Signed)
PMR Admission Coordinator Pre-Admission Assessment  Patient: Cynthia Rosales is an 34 y.o., female MRN: [id number] DOB: [birth year] Height: 5\' 4"  (162.6 cm) (PER PT REPORT) Weight: (!) 158.2 kg (348 lb 12.3 oz)              Insurance Information HMO:      PPO:       PCP:       IPA:       80/20:       OTHER:   PRIMARY: Medicaid [place] access      Policy#:  [id number]      Subscriber: [name] CM Name:        Phone#:       Fax#:   Pre-Cert#:        Employer: Not employed Benefits:  Phone #: [phone]     Name: Automated Eff. 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 female with history of HTN, DM, morbid obesity, SSD, progressive neck pain with radiation to BUE and recent MRI with C4-C5 expansile mass. She was admitted for further work up via [facility] office on 12/10/14 due reports of recent loss of B/B function with progressive quadreparesis and was started on IV steroids. 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 She was taken to OR on 12/13/13 for C3-C5 laminectomy with resection of intramedullary tumor by Dr. Annette Stable. Pathology positive for ependymoma. Post op course complicated by abdominal pian due to paralytic ileus or enteritis and CCM consulted for assistacne. She was made NPO with TNA for nutritional  supplement and NGT placed for decompression. Hypotension treated with IVF. Follow up KUB with increasing small bowel dilatation but patient has had improvement in GI symptoms. NGT d/c and patient advanced to regular diet yesterday and has had some N/V per reports. She has had improvement in tetraplegia but continues to have RLE > LLE weakness affecting mobility. Therapy has been ongoing with patient showing good motivation and steady progress. CIR was recommended for follow up therapy and patient medically ready today for admission.     Past Medical History  Past Medical History  Diagnosis Date  . Hypertension   . Asthma   . Type II diabetes mellitus   . History of blood transfusion     "after I had one of my kids"  . Sickle cell disease   . Anemia   . Depression   . Ovarian cyst   . Renal disorder     kidney stones    Family History  family history  includes Breast cancer in her maternal aunt and maternal aunt; Ovarian cancer in her maternal grandmother. There is no history of Migraines.  Prior Rehab/Hospitalizations:  Had water therapy with PT outpatient 2 yrs ago.   Current Medications   Current facility-administered medications:  .  acetaminophen (TYLENOL) tablet 650 mg, 650 mg, Oral, Q6H PRN **OR** acetaminophen (TYLENOL) suppository 650 mg, 650 mg, Rectal, Q6H PRN, Charlie Pitter, MD .  alum & mag hydroxide-simeth (MAALOX/MYLANTA) 200-200-20 MG/5ML suspension 30 mL, 30 mL, Oral, Q6H PRN, Charlie Pitter, MD, 30 mL at 12/23/14 0456 .  baclofen (LIORESAL) tablet 10 mg, 10 mg, Oral, QID, Charlie Pitter, MD, 10 mg at 12/24/14 1113 .  bisacodyl (DULCOLAX) suppository 10 mg, 10 mg, Rectal, Daily PRN, Raylene Miyamoto, MD, 10 mg at 12/19/14 1645 .  chlorhexidine (PERIDEX) 0.12 % solution 15 mL, 15 mL, Mouth Rinse, BID, Raylene Miyamoto, MD, 15 mL at 12/24/14 0807 .  famotidine (PEPCID) tablet 20 mg, 20 mg, Oral, BID, Tanda Rockers, MD, 20 mg at 12/24/14 1113 .  heparin injection 5,000  Units, 5,000 Units, Subcutaneous, 3 times per day, Floyce Stakes, MD, 5,000 Units at 12/24/14 [phone] .  HYDROmorphone (DILAUDID) injection 0.5-1 mg, 0.5-1 mg, Intravenous, Q2H PRN, Dianne Dun, NP, 1 mg at 12/24/14 1114 .  HYDROmorphone (DILAUDID) tablet 2-4 mg, 2-4 mg, Oral, Q3H PRN, Charlie Pitter, MD, 4 mg at 12/23/14 0454 .  insulin aspart (novoLOG) injection 0-20 Units, 0-20 Units, Subcutaneous, TID WC, Charlie Pitter, MD, 7 Units at 12/24/14 [phone] .  levalbuterol (XOPENEX) nebulizer solution 0.63 mg, 0.63 mg, Nebulization, Q6H PRN, Corey Harold, NP .  metoCLOPramide (REGLAN) injection 10 mg, 10 mg, Intravenous, Q6H PRN, Tanda Rockers, MD, 10 mg at 12/23/14 1144 .  ondansetron (ZOFRAN) tablet 4 mg, 4 mg, Oral, Q6H PRN, 4 mg at 12/22/14 2134 **OR** ondansetron (ZOFRAN) injection 4 mg, 4 mg, Intravenous, Q6H PRN, Charlie Pitter, MD, 4 mg at 12/24/14 1114 .  sodium chloride 0.9 % injection 10-40 mL, 10-40 mL, Intracatheter, Q12H, Charlie Pitter, MD, 10 mL at 12/23/14 2223 .  sodium chloride 0.9 % injection 10-40 mL, 10-40 mL, Intracatheter, PRN, Charlie Pitter, MD, 10 mL at 12/24/14 0092 .  sodium phosphate (FLEET) 7-19 GM/118ML enema 1 enema, 1 enema, Rectal, Once, Raylene Miyamoto, MD  Patients Current Diet: Diet Carb Modified  Precautions / Restrictions Precautions Precautions: Fall, Cervical Cervical Brace: Soft collar, For comfort (pt did not need collor during OT treat) Restrictions Weight Bearing Restrictions: No   Prior Activity Level Limited Community (1-2x/wk): Martin Majestic out when she had a ride to go.  Has not worked in the past 4 yrs.  No transportation except by bus or with friends.   Home Assistive Devices / Equipment Home Assistive Devices/Equipment: None  Prior Functional Level Prior Function Level of Independence: Needs assistance ADL's / Homemaking Assistance Needed: Boyfriend had started A with lower body dressing and Homemaking.    Current Functional  Level Cognition  Overall Cognitive Status: Within Functional Limits for tasks assessed Orientation Level: Oriented X4    Extremity Assessment (includes Sensation/Coordination)  Upper Extremity Assessment: RUE deficits/detail, LUE deficits/detail RUE Deficits / Details: Grip 2/3, decreased opposition, wrist 3/5, forearm, tight in supination, elbow flexion/extension 2+/5, shoulder flexion 2-/5 RUE Coordination: decreased fine motor, decreased gross motor LUE Deficits / Details:  3/5 throughout, except also tight in supination, WFL for opposition LUE Coordination: decreased fine motor, decreased gross motor  Lower Extremity Assessment: RLE deficits/detail, LLE deficits/detail RLE Deficits / Details: pt with only trace movements in LE.  No sensation to soft touch or painful stimuli.   RLE Sensation: decreased light touch (No pain sensation.) RLE Coordination: decreased fine motor, decreased gross motor LLE Deficits / Details: pt demos gross strength 1/5 with only antigravity movement being at toes.  Sensation to soft touch diminished in thigh/knee/upper calf, but absent in lower calf and foot.  pt with diminished pain sensation in entire LE with pt indicating she's able to feel it as touch, but not pain.   LLE Sensation: decreased light touch (Diminished pain sensation.  )    ADLs  Overall ADL's : Needs assistance/impaired Eating/Feeding: Modified independent, With adaptive utensils, Sitting Eating/Feeding Details (indicate cue type and reason): handled cup with lid (iniating with right hand and follow up with left hand), red tubing for utensils Grooming: Moderate assistance, Bed level Upper Body Bathing: Maximal assistance, Bed level (increased body habitus) Lower Body Bathing: Total assistance, Bed level Upper Body Dressing : Total assistance, Bed level Lower Body Dressing: Total assistance, Bed level Toilet Transfer: Moderate assistance, +2 for physical assistance, Ambulation, RW Toilet  Transfer Details (indicate cue type and reason): recliner>8 feet forward (sit in recliner) x2 General ADL Comments: Patient motivated to go to CIR. Enouraged patient to use red foam to build up utensils and stressball to increase grip strength.     Mobility  Overal bed mobility: Needs Assistance Bed Mobility: Supine to Sit Supine to sit: Min assist, HOB elevated Sit to supine: Max assist, +2 for physical assistance General bed mobility comments: using bed rails and verbal cues for technique    Transfers  Overall transfer level: Needs assistance Equipment used: Rolling walker (2 wheeled) Transfers: Sit to/from Stand Sit to Stand: Mod assist Stand pivot transfers: Mod assist General transfer comment: verbal cues for hand placement and increased assistance with hips    Ambulation / Gait / Stairs / Wheelchair Mobility  Ambulation/Gait Ambulation/Gait assistance: +2 physical assistance, Min assist, Mod assist Ambulation Distance (Feet): 10 Feet (x 2) Assistive device: Rolling walker (2 wheeled) Gait Pattern/deviations: Step-through pattern, Decreased step length - right, Decreased step length - left Gait velocity interpretation: Below normal speed for age/gender General Gait Details: Intermittent assist to advance RLE further. Pt with rt knee in hyperextension for support. If rt knee not in hyperextension tends to give out with stance. Manual support of rt knee.    Posture / Balance Dynamic Sitting Balance Sitting balance - Comments: pt utilizes Bil UE support and MinA to maintain balance.      Special needs/care consideration BiPAP/CPAP No CPM No Continuous Drip IV No Dialysis No       Life Vest No Oxygen No Special Bed No Trach Size No Wound Vac (area) No     Skin No                              Bowel mgmt: Last BM 12/22/14, loose, incontinent Bladder mgmt: Foley catheter removed this am 12/24/14 Diabetic mgmt: Yes, since she was 34 yo and on insulin    Previous Home  Environment Living Arrangements: Spouse/significant other, [place]: No  Discharge Living Setting Plans for Discharge Living Setting: House, Lives with (comment) (Lives with BF and her 3 sons.) Type of Home at Discharge: House Discharge Home Layout: Two level, Bed/bath upstairs Alternate Level Stairs-Number of Steps: Flight Discharge Home Access:  Stairs to enter [employer] of Steps: 3-4 step entry Does the patient have any problems obtaining your medications?: No  Social/Family/Support Systems Patient Roles: Parent, Other (Comment) (Has a BF and 3 sons ages 55, 59 and 11.) Contact Information: Donnajean Lopes - aunt [phone] Anticipated Caregiver: boyfriend Ability/Limitations of Caregiver: BF is mentally disabled and currently not working and can assist.  Mother lives in [place].  Has 1 aunt in [place], 1 aunt in [place], [place].  Markus Jarvis, lives here in [place]. Caregiver Availability: 24/7 Discharge Plan Discussed with Primary Caregiver: Yes Is Caregiver In Agreement with Plan?: Yes Does Caregiver/Family have Issues with Lodging/Transportation while Pt is in Rehab?: No  Goals/Additional Needs Patient/Family Goal for Rehab: PT/OT supervision to min assist Expected length of stay: 30-40 days Cultural Considerations: None Dietary Needs: Carb mod med cal, thin liquids Equipment Needs: TBD Pt/Family Agrees to Admission and willing to participate: Yes Program Orientation Provided & Reviewed with Pt/Caregiver Including Roles  & Responsibilities: Yes  Decrease burden of Care through IP rehab admission: N/A  Possible need for SNF placement upon discharge: Yes, if she does not progress to where BF can manage at home.  Patient Condition: This patient's medical and functional status has changed since the consult dated: 12/14/14 in which the Rehabilitation Physician determined and documented that the patient's condition is appropriate for intensive  rehabilitative care in an inpatient rehabilitation facility. See "History of Present Illness" (above) for medical update. Functional changes are: Currently requiring mod assist for transfers and min/mod assist to ambulate 10 ft X 2 RW. Patient's medical and functional status update has been discussed with the Rehabilitation physician and patient remains appropriate for inpatient rehabilitation. Will admit to inpatient rehab today.  Preadmission Screen Completed By:  Retta Diones, 12/24/2014 12:35 PM ______________________________________________________________________   Discussed status with Dr. Naaman Plummer on 12/24/14 at 1228 and received telephone approval for admission today.  Admission Coordinator:  Retta Diones, time1228/Date01/25/16

## 2014-12-25 ENCOUNTER — Encounter (HOSPITAL_COMMUNITY): Payer: Self-pay | Admitting: Occupational Therapy

## 2014-12-25 ENCOUNTER — Inpatient Hospital Stay (HOSPITAL_COMMUNITY): Payer: Self-pay

## 2014-12-25 DIAGNOSIS — E669 Obesity, unspecified: Secondary | ICD-10-CM

## 2014-12-25 DIAGNOSIS — K9189 Other postprocedural complications and disorders of digestive system: Secondary | ICD-10-CM

## 2014-12-25 DIAGNOSIS — R609 Edema, unspecified: Secondary | ICD-10-CM

## 2014-12-25 DIAGNOSIS — E1169 Type 2 diabetes mellitus with other specified complication: Secondary | ICD-10-CM

## 2014-12-25 DIAGNOSIS — K567 Ileus, unspecified: Secondary | ICD-10-CM | POA: Diagnosis present

## 2014-12-25 LAB — CBC WITH DIFFERENTIAL/PLATELET
Basophils Absolute: 0 10*3/uL (ref 0.0–0.1)
Basophils Relative: 0 % (ref 0–1)
Eosinophils Absolute: 0.1 10*3/uL (ref 0.0–0.7)
Eosinophils Relative: 1 % (ref 0–5)
HCT: 28.5 % — ABNORMAL LOW (ref 36.0–46.0)
Hemoglobin: 9.1 g/dL — ABNORMAL LOW (ref 12.0–15.0)
Lymphocytes Relative: 33 % (ref 12–46)
Lymphs Abs: 3.6 10*3/uL (ref 0.7–4.0)
MCH: 22.3 pg — ABNORMAL LOW (ref 26.0–34.0)
MCHC: 31.9 g/dL (ref 30.0–36.0)
MCV: 69.9 fL — ABNORMAL LOW (ref 78.0–100.0)
Monocytes Absolute: 0.7 10*3/uL (ref 0.1–1.0)
Monocytes Relative: 6 % (ref 3–12)
Neutro Abs: 6.6 10*3/uL (ref 1.7–7.7)
Neutrophils Relative %: 60 % (ref 43–77)
Platelets: 349 10*3/uL (ref 150–400)
RBC: 4.08 MIL/uL (ref 3.87–5.11)
RDW: 17.5 % — ABNORMAL HIGH (ref 11.5–15.5)
WBC: 11 10*3/uL — ABNORMAL HIGH (ref 4.0–10.5)

## 2014-12-25 LAB — BASIC METABOLIC PANEL
Anion gap: 7 (ref 5–15)
BUN: <5 mg/dL — ABNORMAL LOW (ref 6–23)
CO2: 28 mmol/L (ref 19–32)
Calcium: 8.3 mg/dL — ABNORMAL LOW (ref 8.4–10.5)
Chloride: 103 mmol/L (ref 96–112)
Creatinine, Ser: 0.64 mg/dL (ref 0.50–1.10)
GFR calc Af Amer: >90 mL/min (ref >90)
GFR calc non Af Amer: >90 mL/min (ref >90)
Glucose, Bld: 184 mg/dL — ABNORMAL HIGH (ref 70–99)
Potassium: 3.8 mmol/L (ref 3.5–5.1)
Sodium: 138 mmol/L (ref 135–145)

## 2014-12-25 LAB — GLUCOSE, CAPILLARY
Glucose-Capillary: 212 mg/dL — ABNORMAL HIGH (ref 70–99)
Glucose-Capillary: 175 mg/dL — ABNORMAL HIGH (ref 70–99)
Glucose-Capillary: 150 mg/dL — ABNORMAL HIGH (ref 70–99)
Glucose-Capillary: 127 mg/dL — ABNORMAL HIGH (ref 70–99)

## 2014-12-25 MED ORDER — INSULIN ASPART 100 UNIT/ML ~~LOC~~ SOLN
10.0000 [IU] | Freq: Three times a day (TID) | SUBCUTANEOUS | Status: DC
  Administered 2014-12-25 – 2015-01-02 (×19): 10 [IU] via SUBCUTANEOUS

## 2014-12-25 MED ORDER — ENOXAPARIN SODIUM 40 MG/0.4ML ~~LOC~~ SOLN
40.0000 mg | Freq: Every day | SUBCUTANEOUS | Status: DC
  Administered 2014-12-25 – 2014-12-26 (×2): 40 mg via SUBCUTANEOUS
  Filled 2014-12-25 (×3): qty 0.4

## 2014-12-25 MED ORDER — TRAZODONE HCL 50 MG PO TABS
50.0000 mg | ORAL_TABLET | Freq: Once | ORAL | Status: AC
  Administered 2014-12-25: 50 mg via ORAL
  Filled 2014-12-25: qty 1

## 2014-12-25 MED ORDER — GABAPENTIN 100 MG PO CAPS
100.0000 mg | ORAL_CAPSULE | Freq: Two times a day (BID) | ORAL | Status: DC
Start: 2014-12-25 — End: 2014-12-26
  Administered 2014-12-25 – 2014-12-26 (×2): 100 mg via ORAL
  Filled 2014-12-25 (×4): qty 1

## 2014-12-25 MED ORDER — GABAPENTIN 100 MG PO CAPS: 100.0000 mg | ORAL_CAPSULE | Freq: Every day | ORAL | Status: DC

## 2014-12-25 MED ORDER — POLYETHYLENE GLYCOL 3350 17 G PO PACK
17.0000 g | PACK | Freq: Every day | ORAL | Status: DC
  Administered 2014-12-25 – 2014-12-26 (×2): 17 g via ORAL
  Filled 2014-12-25 (×3): qty 1

## 2014-12-25 NOTE — Progress Notes (Signed)
Bilateral lower extremity venous duplex completed:  No obvious evidence of DVT, superficial thrombosis, or Baker's cyst.  Technically difficult study due to the patient's body habitus.   

## 2014-12-25 NOTE — Progress Notes (Signed)
Occupational Therapy Session Note  Patient Details  Name: Cynthia Rosales MRN: [id number] Date of Birth: 30-May-1981  Today's Date: 12/25/2014 OT Individual Time: 1300-1400 OT Individual Time Calculation (min): 60 min   Short Term Goals: Week 1:  OT Short Term Goal 1 (Week 1): Pt will perform toileting with supervision OT Short Term Goal 2 (Week 1): Pt will don LB clothing with supervison with AE prn OT Short Term Goal 3 (Week 1): Pt will perform bed mobility with supervision without bed rails and from a flat bed OT Short Term Goal 4 (Week 1): Pt will perform kitchen task with superivision sit to stand   Skilled Therapeutic Interventions/Progress Updates:  Therapeutic activity with focus on orientation and training on kitchen task using RW, homemaking (making bed) while standing at RW, endurance, pain management, and grip/pinch strength assessment.  Grip/pinch per Jamar and pinch test (concealed from pt's view)  R 5 lbs (questionable effort, improved from 1 lb with coaxing)  L 45 lbs  Pinch Right  Key: 12,  Tip: 6   Palmar: 5 Left     Key: 15  Tip:10   Palmar:10  HEP placed in pt handbook and theraputty provided; needs reinforcement training   After setup with RW and escort to kitchen, pt completed sit>stand and functional mobility within kitchen environment to include opening refrigerator and 1 cabinet door with standby assist.   Pt requires min vc to relax upper body tension while ambulating and returned to w/c for rest.   From kitchen pt returned to her room and provided assist with making her own bed (applying 1 side of fitted sheet) while standing in RW.  Pt fatigued quickly and returned to w/c while therapist finished task and then assisted pt to bed, lifting both legs into bed, after she transferred from w/c with standby assist.     Therapy Documentation Precautions:  Precautions Precautions: Fall, Cervical Required Braces or Orthoses: Cervical Brace Cervical Brace: Soft  collar, For comfort Restrictions Weight Bearing Restrictions: No  Pain: Pain Assessment Pain Score: 8  Pain Type: Acute pain Pain Location: Neck Pain Descriptors / Indicators: Aching Pain Intervention(s): Medication (See eMAR)  See FIM for current functional status  Therapy/Group: Individual Therapy  Derwood Becraft 12/25/2014, 3:15 PM

## 2014-12-25 NOTE — Progress Notes (Signed)
Inpatient Diabetes Program Recommendations  AACE/ADA: New Consensus Statement on Inpatient Glycemic Control (2013)  Target Ranges:  Prepandial:   less than 140 mg/dL      Peak postprandial:   less than 180 mg/dL (1-2 hours)      Critically ill patients:  140 - 180 mg/dL   Results for Cynthia Rosales, Cynthia Rosales (MRN [id number]) as of 12/25/2014 10:07  Ref. Range 12/24/2014 07:09 12/24/2014 11:21 12/24/2014 16:11 12/24/2014 21:03 12/25/2014 07:43  Glucose-Capillary Latest Range: 70-99 mg/dL 228 (H) 199 (H) 216 (H) 258 (H) 212 (H)    Diabetes history: DM 2 Outpatient Diabetes medications: Lantus 50 units BID, Novolog 20 units TID with meals, Metformin 1,000 mg BID Current orders for Inpatient glycemic control: Lantus 10 units QHS, Novolog 0-20 TID with meals, Novolog 10 units TID for meal coverage  Inpatient Diabetes Program Recommendations Insulin - Basal: Patients fasting CBG this am 212 mg/dl. Please consider increasing Lantus to 15 units QHS.  Thanks,  Tama Headings RN, MSN, [facility] Inpatient Diabetes Coordinator Team Pager [phone]

## 2014-12-25 NOTE — Patient Care Conference (Signed)
Inpatient RehabilitationTeam Conference and Plan of Care Update Date: 12/25/2014   Time: 2:05 PM    Patient Name: Cynthia Rosales      Medical Record Number: [id number]  Date of Birth: Jan 01, 1981 Sex: Female         Room/Bed: 4W24C/4W24C-01 Payor Info: Payor: MEDICAID [place] / Plan: MEDICAID [place] ACCESS / Product Type: *No Product type* /    Admitting Diagnosis: tetraplegia due to ependymoma  tumor resecton  Admit Date/Time:  12/24/2014  3:36 PM Admission Comments: No comment available   Primary Diagnosis:  Tetraplegia Principal Problem: Tetraplegia  Patient Active Problem List   Diagnosis Date Noted  . Ileus, postoperative 12/25/2014  . Sickle cell disease without crisis 12/25/2014  . Diabetes mellitus type 2 in obese 12/25/2014  . Ependymoma of spinal cord 12/24/2014  . Tetraplegia 12/24/2014  . C4 spinal cord injury 12/24/2014  . Ileus of unspecified type   . Atelectasis   . Bowel obstruction   . Encounter for central line care   . Encounter for nasogastric (NG) tube placement   . Ileus   . Intestinal occlusion   . 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  . Headache 11/19/2014  . Neck pain 11/19/2014  . Right sided weakness 11/19/2014  . Paresthesias 11/19/2014  . Nightmares 09/11/2014  . HTN (hypertension), benign 03/28/2014  . Nephrolithiasis 03/27/2014  . Diabetes mellitus 03/27/2014  . Abdominal pain 03/27/2014    Expected Discharge Date: Expected Discharge Date: 01/04/15  Team Members Present: Physician leading conference: Dr. Alger Simons Social Worker Present: Lennart Pall, LCSW Nurse Present: Heather Roberts, RN PT Present: Canary Brim, Lorriane Shire, PT OT Present: Salome Spotted, OT;Jennifer Tamala Julian, OT PPS Coordinator present : Daiva Nakayama, RN, CRRN     Current Status/Progress Goal Weekly Team Focus  Medical   ependymoma with spinal cord compression at C4. tetraplegia---resolving  improve functional mobility  pain control, ileus rx---advancing diet    Bowel/Bladder   Pt cont. of bowel and bladder. LBM 1-23; resolving ilius. Daily miralaxx.   manage b/b Mod I.   cont. daily miralaxx. regular pattern of BMs   Swallow/Nutrition/ Hydration             ADL's   min to mod A currently  mod I overall  UE strength and coordination; functional mobility; d/c planning activity tolerance   Mobility   Overall minA to SBA  mod (I)  standing balance, motor recovery for RUE/RLE, equipment evaluation, stairs   Communication             Safety/Cognition/ Behavioral Observations            Pain   Pain to neck with oxycodone 15mg  q4hrs PRN; heating pad to back   4 or less  medicate as needed and cont. use of heating pad   Skin   steri strips to incision posterior neck.   remain free of skin [place] and infection min assist  assess skin q shift. monitor site for s/s of infection    Rehab Goals Patient on target to meet rehab goals: Yes *See Care Plan and progress notes for long and short-term goals.  Barriers to Discharge: pain, neuro deficits    Possible Resolutions to Barriers:  NMR, adaptive equipment    Discharge Planning/Teaching Needs:  home with her boyfriend who is able to provide 24/7 assistance      Team Discussion:  New eval.  Spinal tumor and s/p surgery and now with tetraplegia.  Motivated with txs.  Ileus resolving.  Mod  ig goals overall.    Revisions to Treatment Plan:  None at this time   Continued Need for Acute Rehabilitation Level of Care: The patient requires daily medical management by a physician with specialized training in physical medicine and rehabilitation for the following conditions: Daily direction of a multidisciplinary physical rehabilitation program to ensure safe treatment while eliciting the highest outcome that is of practical value to the patient.: Yes Daily medical management of patient stability for increased activity during participation in an intensive rehabilitation regime.: Yes Daily analysis  of laboratory values and/or radiology reports with any subsequent need for medication adjustment of medical intervention for : Neurological problems;Post surgical problems  Osmar Howton 12/25/2014, 3:55 PM

## 2014-12-25 NOTE — Progress Notes (Addendum)
[place] PHYSICAL MEDICINE & REHABILITATION     PROGRESS NOTE    Subjective/Complaints: No major issues. Asked questions about medical/functional progrnosis. Would also like to eat "real" food. Central line "itching"  Objective: Vital Signs: Blood pressure 118/72, pulse 97, temperature 98.9 F (37.2 C), temperature source Oral, resp. rate 20, height 5\' 4"  (1.626 m), weight 152.3 kg (335 lb 12.2 oz), last menstrual period 12/16/2014, SpO2 99 %. Dg Abd 1 View  12/24/2014   CLINICAL DATA:  Initial encounter for vomiting for several days.  EXAM: ABDOMEN - 1 VIEW  COMPARISON:  12/21/2014  FINDINGS: Two supine views. Both moderately degraded by patient body habitus. No bowel distention to suggest obstruction. Gas within normal caliber large and small bowel. No gross free intraperitoneal air.  IMPRESSION: Moderate degradation, secondary to patient body habitus.  Improvement in bowel distention since the prior exam. No gross bowel obstruction identified.   Electronically Signed   By: Abigail Miyamoto M.D.   On: 12/24/2014 17:45   No results for input(s): WBC, HGB, HCT, PLT in the last 72 hours. No results for input(s): NA, K, CL, GLUCOSE, BUN, CREATININE, CALCIUM in the last 72 hours.  Invalid input(s): CO CBG (last 3)   Recent Labs  12/24/14 1611 12/24/14 2103 12/25/14 0743  GLUCAP 216* 258* 212*    Wt Readings from Last 3 Encounters:  12/24/14 152.3 kg (335 lb 12.2 oz)  12/05/14 145.151 kg (320 lb)  11/19/14 155.674 kg (343 lb 3.2 oz)    Physical Exam:  Constitutional: She is oriented to person, place, and time. She appears well-developed and well-nourished.  Morbidly obese female. No distress HENT: oral mucosa moist, mild white coating on tongue Head: Normocephalic and atraumatic.  Eyes: Conjunctivae are normal. Pupils are equal, round, and reactive to light.  Neck: Normal range of motion. Neck supple.  Cardiovascular: Normal rate and regular rhythm.  Respiratory: Effort  normal and breath sounds normal. No respiratory distress. She has no wheezes.  GI: Soft. She exhibits no distension. Bowel sounds are scarce. There is no tenderness.  Musculoskeletal: She exhibits no edema or tenderness.  Neurological: She is alert and oriented to person, place, and time.  Speech clear. Follows basic commands without difficulty.cognitively intact.  Right upper ext 4/5 delt, tricep, bicep, hand. LUE: 4+ to 5/5 prox to distal. RLE: 1+ to 2-/5 HF and KE and 1/5 ankle. LLE 3 hf, ke and 4/5 ankle. Sensation 1/2 RUE and RLE---senses pinch. Has 1+ to 2/2 trunk and left upper and lower. DTR's absent  Skin: Skin is warm and dry. Surgical site clean with steristrips. Psych: pt is pleasant and cooperative.   Assessment/Plan: 1. 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 Physiatrist is providing close team supervision and 24 hour management of active medical problems listed below. Physiatrist and rehab team continue to assess barriers to discharge/monitor patient progress toward functional and medical goals. FIM:                                 Medical Problem List and Plan: 1. 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 DVT Prophylaxis/Anticoagulation: Pharmaceutical: Lovenox 3. Pain Management: oxycodone, tylenol -baclofen for spasms 4. Mood: LCSW to follow for revaluation and support.  5. Neuropsych: This patient is capable of making decisions on her own  behalf. 6. Skin/Wound Care: Monitor wound daily. Routine pressure relief measures.  7. Fluids/Electrolytes/Nutrition: Monitor I/O. Check labs today 8. Ileus: Scheduled Reglan ac/hs and monitor for diet tolerance. Scheduled suppository  -KUB with improvement  -pt would like to upgrade  diet 9. TTC:NGFR monitor every 8 hours. Resume metoprolol to help with BP as well as heart rate control.  10. DM type 2: Was on lantus 50 units with novolog tid and metformin at home. Will monitor BS with achs checks. Resumed lantus 10 units at bedtime and titrate to home dose as diet advanced. ---observe today 11. Hyponatremia: Multifactorial due to diet, N/V. Will recheck today     LOS (Days) 1 A FACE TO FACE EVALUATION WAS PERFORMED  SWARTZ,ZACHARY T 12/25/2014 8:11 AM

## 2014-12-25 NOTE — Progress Notes (Signed)
Patient information reviewed and entered into eRehab system by Salih Williamson, RN, CRRN, PPS Coordinator.  Information including medical coding and functional independence measure will be reviewed and updated through discharge.    

## 2014-12-25 NOTE — Evaluation (Signed)
Physical Therapy Assessment and Plan  Patient Details  Name: Cynthia Rosales MRN: [id number] Date of Birth: July 08, 1981  PT Diagnosis: Abnormality of gait, Impaired sensation, Muscle spasms and Muscle weakness, Tetraplegia Rehab Potential: Good ELOS: 10-14 days   Today's Date: 12/25/2014 PT Individual Time: 0900-1000 PT Individual Time Calculation (min): 60 min   Session 2 Time: 3790-2409 Time Calculation (min): 50 min Missed 10 minutes due to pain  Problem List:  Patient Active Problem List   Diagnosis Date Noted  . Ileus, postoperative 12/25/2014  . Sickle cell disease without crisis 12/25/2014  . Diabetes mellitus type 2 in obese 12/25/2014  . Ependymoma of spinal cord 12/24/2014  . Tetraplegia 12/24/2014  . C4 spinal cord injury 12/24/2014  . Ileus of unspecified type   . Atelectasis   . Bowel obstruction   . Encounter for central line care   . Encounter for nasogastric (NG) tube placement   . Ileus   . Intestinal occlusion   . 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  . Headache 11/19/2014  . Neck pain 11/19/2014  . Right sided weakness 11/19/2014  . Paresthesias 11/19/2014  . Nightmares 09/11/2014  . HTN (hypertension), benign 03/28/2014  . Nephrolithiasis 03/27/2014  . Diabetes mellitus 03/27/2014  . Abdominal pain 03/27/2014    Past Medical History:  Past Medical History  Diagnosis Date  . Hypertension   . Asthma   . Type II diabetes mellitus   . History of blood transfusion     "after I had one of my kids"  . Sickle cell disease   . Anemia   . Depression   . Ovarian cyst   . Renal disorder     kidney stones   Past Surgical History:  Past Surgical History  Procedure Laterality Date  . Cesarean section  2000; 2007; 2011  . Dilation and curettage of uterus    . Appendectomy  2013  . Reduction mammaplasty Bilateral 1998  . Laminectomy N/A 12/13/2014    Procedure: CERVICAL LAMINECTOMY FOR INTRADURAL TUMOR;  Surgeon: Charlie Pitter, MD;  Location: [facility];   Service: Neurosurgery;  Laterality: N/A;  posterior    Assessment & Plan Clinical Impression:[name] is a 34 y.o. female with history of HTN, DM, morbid obesity, SSD, progressive neck pain with radiation to BUE and recent MRI with C4-C5 expansile mass. She was admitted for further work up via [place] office on 12/10/14 due reports of recent loss of B/B function with progressive quadreparesis and was started on IV steroids. 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  She was taken to OR on 12/13/13 for C3-C5 laminectomy with resection of intramedullary tumor by Dr. Annette Stable. Pathology positive for ependymoma. Post op course complicated by abdominal pian due to paralytic ileus or enteritis and CCM consulted for assistacne. She was made NPO with TNA for nutritional supplement and NGT placed for decompression. Hypotension treated with IVF. Follow up KUB with increasing small bowel dilatation but patient has had improvement in GI symptoms. NGT d/c and patient advanced to regular diet yesterday and has had some N/V per reports. She has had improvement in tetraplegia but continues to have RLE > LLE weakness affecting mobility. Therapy has been ongoing with patient showing good motivation and steady progress. CIR was recommended for follow up therapy and patient medically ready today for admission.    Patient transferred to CIR on 12/24/2014 .   Patient currently requires min with mobility secondary  to muscle weakness, decreased coordination and decreased standing balance and decreased postural control.  Prior to hospitalization, patient was independent  with mobility and lived with Significant other, Son (lives w/ boyfriend and 3 sons) in a House home.  Home access is 4Stairs to enter.  Patient will benefit from skilled PT intervention to maximize safe functional mobility, minimize fall risk and decrease caregiver burden for planned discharge  home with intermittent assist.  Anticipate patient will benefit from follow up [facility] at discharge.  PT - End of Session Activity Tolerance: Tolerates 30+ min activity with multiple rests Endurance Deficit: Yes PT Assessment Rehab Potential (ACUTE/IP ONLY): Good Barriers to Discharge: Inaccessible home environment;Decreased caregiver support PT Plan PT Intensity: Minimum of 1-2 x/day ,45 to 90 minutes PT Frequency: 5 out of 7 days PT Duration Estimated Length of Stay: 10-14 days PT Treatment/Interventions: Ambulation/gait training;Balance/vestibular training;Cognitive remediation/compensation;Community reintegration;DME/adaptive equipment instruction;Discharge planning;Functional mobility training;Neuromuscular re-education;Patient/family education;Pain management;Psychosocial support;Skin care/wound management;Stair training;Therapeutic Activities;Therapeutic Exercise;UE/LE Strength taining/ROM;UE/LE Coordination activities PT Recommendation Recommendations for Other Services: Neuropsych consult Follow Up Recommendations: Home health PT Patient destination: Home Equipment Recommended: Rolling walker with 5" wheels  Skilled Therapeutic Intervention Session 1: Pt received seated in w/c, agreeable to participate in therapy. Session focused on functional evaluation, gait training. See details of evaluation below. Pt oriented to rehab unit, role of PT in rehab, safety on unit, falls risk. Pt fit into 24x18 bariatric w/c. Pt overall MinGuard for all upright mobility around room, cleared to ambulate to/from bathroom w/ nursing. In rehab gym pt ambulated 14' x2 w/ RW and Min Guard-CGA. Pt instructed to push through hands w/ bari RW when swinging L leg forward due to weakness on R, pt able to demonstrate gait technique effectively. Pt's gait characterized by hyperextension on R in stance, foot flat on R, intermittent foot drag on R. Pt may benefit from bracing on R foot to increase stability in gait. Session  ended in pt's room, where pt was left seated in w/c w/ all needs within reach.    Session 2: Pt received seated in w/c, agreeable to participate in therapy. Pt ambulated 170' to rehab gym w/ bari RW and close w/c follow, however pt did not require w/c and able to reach target seat in rehab gym without need to sit. Remainder of session focused on balance evaluation w/ Berg Balance Test, with pt scoring 17/56. Pt unable to complete final two tasks of test due to onset of significant RLE pain with descriptors of "burning, shooting." Pt transported back to room via Griffin Basil, Ramona made aware of pt's pain.     PT Evaluation Precautions/Restrictions Precautions Precautions: Fall;Cervical Required Braces or Orthoses: Cervical Brace Cervical Brace: Soft collar;For comfort Restrictions Weight Bearing Restrictions: No General Chart Reviewed: Yes Vital SignsTherapy Vitals Temp: 97.8 F (36.6 C) Temp Source: Oral Pulse Rate: 97 Resp: 18 BP: 123/72 mmHg Patient Position (if appropriate): Lying Oxygen Therapy SpO2: 98 % O2 Device: Not Delivered Pain Pain Assessment Pain Score: 10-Worst pain ever Pain Type: Acute pain Pain Location: Leg Pain Orientation: Right Pain Descriptors / Indicators: Shooting;Sharp Pain Intervention(s): Medication (See eMAR) Home Living/Prior Functioning Home Living Available Help at Discharge: Available 24 hours/day Type of Home: House Home Access: Stairs to enter [employer] of Steps: 4 Entrance Stairs-Rails: None Home Layout: Two level;Bed/bath upstairs Alternate Level Stairs-Rails: Right (R side going up)  Lives With: Significant other;Son (lives w/ boyfriend and 3 sons) Prior Function Level of Independence: Independent with basic ADLs;Independent with gait  Able to  Take Stairs?: Yes Vocation: On disability Vision/Perception     Cognition Overall Cognitive Status: Within Functional Limits for tasks assessed Arousal/Alertness:  Awake/alert Orientation Level: Oriented X4 Sensation Sensation Light Touch: Impaired Detail Light Touch Impaired Details: Impaired RUE;Impaired RLE;Impaired LLE (Impaired in R>L) Proprioception: Impaired Detail Proprioception Impaired Details: Impaired RLE (Impaired at R knee/ankle) Motor  Motor Motor: Tetraplegia  Mobility Bed Mobility Bed Mobility: Supine to Sit;Sit to Supine Supine to Sit: 4: Min assist Sit to Supine: 4: Min assist Transfers Transfers: Yes Sit to Stand: 4: Min assist Stand to Sit: 4: Min assist Locomotion  Ambulation Ambulation: Yes Ambulation/Gait Assistance: 5: Supervision Assistive device: Rolling walker Gait Gait: Yes Gait Pattern: Right foot flat;Antalgic (hyperextension and intermittent foot drag on R) Product manager Mobility: Yes Wheelchair Assistance: 5: Careers information officer: Both upper extremities Wheelchair Parts Management: Needs assistance  Trunk/Postural Assessment  Cervical Assessment Cervical Assessment:  (forward head) Thoracic Assessment Thoracic Assessment:  (rounded thoracic region) Lumbar Assessment Lumbar Assessment:  (lordosis) Postural Control Postural Control: Within Functional Limits  Balance Standardized Balance Assessment Standardized Balance Assessment: Berg Balance Test Berg Balance Test Sit to Stand: Able to stand using hands after several tries Standing Unsupported: Able to stand 30 seconds unsupported Sitting with Back Unsupported but Feet Supported on Floor or Stool: Able to sit safely and securely 2 minutes Stand to Sit: Controls descent by using hands Transfers: Needs one person to assist Standing Unsupported with Eyes Closed: Able to stand 10 seconds with supervision Standing Ubsupported with Feet Together: Needs help to attain position but able to stand for 30 seconds with feet together From Standing, Reach Forward with Outstretched Arm: Reaches forward but needs  supervision From Standing Position, Pick up Object from Floor: Unable to try/needs assist to keep balance From Standing Position, Turn to Look Behind Over each Shoulder: Needs assist to keep from losing balance and falling (Unable to rotate head due to cervical surgery) Turn 360 Degrees: Needs assistance while turning Standing Unsupported, Alternately Place Feet on Step/Stool: Needs assistance to keep from falling or unable to try Standing Unsupported, One Foot in Front: Loses balance while stepping or standing Standing on One Leg: Unable to try or needs assist to prevent fall Total Score: 17 Extremity Assessment      RLE Assessment RLE Assessment: Exceptions to [facility] RLE Strength RLE Overall Strength Comments: Overall 2+/5 LLE Assessment LLE Assessment: Exceptions to [facility] LLE Strength LLE Overall Strength Comments: Overall 3+/5   FIM:  FIM - Locomotion: Ambulation Ambulation/Gait Assistance: 5: Supervision   Refer to Care Plan for Long Term Goals  Recommendations for other services: Neuropsych  Discharge Criteria: Patient will be discharged from PT if patient refuses treatment 3 consecutive times without medical reason, if treatment goals not met, if there is a change in medical status, if patient makes no progress towards goals or if patient is discharged from hospital.  The above assessment, treatment plan, treatment alternatives and goals were discussed and mutually agreed upon: by patient  Rada Hay 12/25/2014, 5:14 PM

## 2014-12-25 NOTE — Evaluation (Signed)
Occupational Therapy Assessment and Plan  Patient Details  Name: Cynthia Rosales MRN: [id number] Date of Birth: [birth year]  OT Diagnosis: acute pain and quadriparesis at level C3-5 Rehab Potential: Rehab Potential (ACUTE ONLY): Good ELOS: 10-12 days   Today's Date: 12/25/2014 OT Individual Time: 1100-1200 OT Individual Time Calculation (min): 60 min     Problem List:  Patient Active Problem List   Diagnosis Date Noted  . Ileus, postoperative 12/25/2014  . Sickle cell disease without crisis 12/25/2014  . Diabetes mellitus type 2 in obese 12/25/2014  . Ependymoma of spinal cord 12/24/2014  . Tetraplegia 12/24/2014  . C4 spinal cord injury 12/24/2014  . Ileus of unspecified type   . Atelectasis   . Bowel obstruction   . Encounter for central line care   . Encounter for nasogastric (NG) tube placement   . Ileus   . Intestinal occlusion   . 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  . Headache 11/19/2014  . Neck pain 11/19/2014  . Right sided weakness 11/19/2014  . Paresthesias 11/19/2014  . Nightmares 09/11/2014  . HTN (hypertension), benign 03/28/2014  . Nephrolithiasis 03/27/2014  . Diabetes mellitus 03/27/2014  . Abdominal pain 03/27/2014    Past Medical History:  Past Medical History  Diagnosis Date  . Hypertension   . Asthma   . Type II diabetes mellitus   . History of blood transfusion     "after I had one of my kids"  . Sickle cell disease   . Anemia   . Depression   . Ovarian cyst   . Renal disorder     kidney stones   Past Surgical History:  Past Surgical History  Procedure Laterality Date  . Cesarean section  2000; 2007; 2011  . Dilation and curettage of uterus    . Appendectomy  2013  . Reduction mammaplasty Bilateral 1998  . Laminectomy N/A 12/13/2014    Procedure: CERVICAL LAMINECTOMY FOR INTRADURAL TUMOR;  Surgeon: Charlie Pitter, MD;  Location: [facility];  Service: Neurosurgery;  Laterality: N/A;  posterior    Assessment & Plan Clinical Impression:  Patient is a 34 y.o. year old female female with history of HTN, DM, morbid obesity, SSD, progressive neck pain with radiation to BUE and recent MRI with C4-C5 expansile mass. She was admitted for further work up via [place] office on 12/10/14 due reports of recent loss of B/B function with progressive quadreparesis and was started on IV steroids. 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 She was taken to OR on 12/13/13 for C3-C5 laminectomy with resection of intramedullary tumor by Dr. Annette Stable. Pathology positive for ependymoma. Post op course complicated by abdominal pian due to paralytic ileus or enteritis and CCM consulted for assistacne. She was made NPO with TNA for nutritional supplement and NGT placed for decompression. Hypotension treated with IVF. Follow up KUB with increasing small bowel dilatation but patient has had improvement in GI symptoms. NGT d/c and patient advanced to regular diet yesterday and has had some N/V per reports. She has had improvement in tetraplegia but continues to have RLE > LLE weakness affecting mobility.  Patient transferred to CIR on 12/24/2014 .    Patient currently requires min to mod A  with basic self-care skills and min A for basic mobility during ADL tasks secondary to muscle weakness, decreased cardiorespiratoy endurance, decreased coordination and decr sensation and properception, acute pain and decreased standing balance and decreased balance strategies.  Prior to hospitalization, patient could complete ADL with independent .  Patient will benefit from skilled intervention to decrease level of assist with basic self-care skills and increase independence with basic self-care skills prior to discharge home with care partner.  Anticipate patient will require A with IADLs prn and follow up outpatient.  OT - End of Session Activity Tolerance: Tolerates 30+ min activity with multiple rests Endurance  Deficit: Yes OT Assessment Rehab Potential (ACUTE ONLY): Good OT Patient demonstrates impairments in the following area(s): Balance;Endurance;Motor;Pain;Perception;Safety;Sensory OT Basic ADL's Functional Problem(s): Grooming;Bathing;Dressing;Toileting OT Advanced ADL's Functional Problem(s): Simple Meal Preparation OT Transfers Functional Problem(s): Toilet;Tub/Shower OT Additional Impairment(s): Fuctional Use of Upper Extremity OT Plan OT Intensity: Minimum of 1-2 x/day, 45 to 90 minutes OT Frequency: 5 out of 7 days OT Duration/Estimated Length of Stay: 10-12 days OT Treatment/Interventions: Balance/vestibular training;Discharge planning;DME/adaptive equipment instruction;Functional mobility training;Pain management;Psychosocial support;Skin care/wound managment;Therapeutic Activities;UE/LE Strength taining/ROM;UE/LE Coordination activities;Therapeutic Exercise;Splinting/orthotics;Self Care/advanced ADL retraining;Patient/family education;Neuromuscular re-education;Disease mangement/prevention;Community reintegration OT Basic Self-Care Anticipated Outcome(s): overall mod I  OT Toileting Anticipated Outcome(s): mod I  OT Bathroom Transfers Anticipated Outcome(s): mod I  OT Recommendation Patient destination: Home Follow Up Recommendations: Outpatient OT Equipment Recommended: To be determined   Skilled Therapeutic Intervention OT eval initiated with OT goals, purpose and role discussed. Self care retraining at shower level. Focus on sit to stand with decr amt of pressured used through UEs for pain management, standing balance (static and dynamic), activity tolerance, functional ambulation with heavy duty RW, shower transfers, functional use of UEs in all tasks. Pt with decr sensation in bilateral feet and right UE>left UE. Discussed goals and d/c planning.   Also discussed pain management techniques and positioning for right shoulder and neck pain.   OT  Evaluation Precautions/Restrictions  Precautions Precautions: Fall;Cervical Required Braces or Orthoses: Cervical Brace Cervical Brace: Soft collar;For comfort Restrictions Weight Bearing Restrictions: No General Chart Reviewed: Yes Family/Caregiver Present: No    Pain Pain Assessment Pain Assessment: 0-10 Pain Score: 7  Pain Type: Acute pain Pain Location: Neck Pain Orientation: Posterior  RN made aware Home Living/Prior Functioning Home Living Available Help at Discharge: Available 24 hours/day Type of Home: House Home Access: Stairs to enter Technical brewer of Steps: 4 Entrance Stairs-Rails: None Home Layout: Two level, Bed/bath upstairs Alternate Level Stairs-Rails: Right  Lives With: Significant other, Son Prior Function Level of Independence: Independent with basic ADLs, Independent with gait  Able to Take Stairs?: Yes Vocation: On disability ADL ADL ADL Comments: see FIM Vision/Perception  Vision- History Baseline Vision/History: No visual deficits Patient Visual Report: No change from baseline  Cognition Overall Cognitive Status: Within Functional Limits for tasks assessed Arousal/Alertness: Awake/alert Orientation Level: Oriented X4 Sensation Sensation Light Touch: Impaired Detail Light Touch Impaired Details: Impaired RUE;Impaired RLE;Impaired LLE;Impaired LUE Proprioception: Impaired Detail Proprioception Impaired Details: Impaired RUE Coordination Gross Motor Movements are Fluid and Coordinated: Yes Fine Motor Movements are Fluid and Coordinated: No Coordination and Movement Description: reports numbness in left finger tips and all down right side of UE. difficulty with coordination of right hand with eyes closed. Weak right hand grasp- but able to hold tools  for bathing and grooming Motor  Motor Motor: Tetraplegia Mobility  Transfers Transfers: Sit to Stand;Stand to Sit Sit to Stand: 4: Min assist Stand to Sit: 4: Min assist   Trunk/Postural Assessment  Cervical Assessment Cervical Assessment:  (flexed forward) Thoracic Assessment Thoracic Assessment:  (rounded thoracic region) Lumbar Assessment Lumbar Assessment:  (lordosis)  Balance Balance Balance Assessed: Yes Static  Standing Balance Static Standing - Balance Support: During functional activity Static Standing - Level of Assistance: 5: Stand by assistance Dynamic Standing Balance Dynamic Standing - Balance Support: During functional activity Dynamic Standing - Level of Assistance: 4: Min assist Extremity/Trunk Assessment RUE Assessment RUE Assessment: Exceptions to [facility] RUE AROM (degrees) RUE Overall AROM Comments: 0-90 degrees (higher range pain) and cervical precautions RUE Strength RUE Overall Strength:  (3+/5 overall) LUE Assessment LUE Assessment: Within Functional Limits (strength 4+/5;  WFL ROM)  FIM:  FIM - Eating Eating Activity: 5: Set-up assist for open containers FIM - Grooming Grooming Steps: Wash, rinse, dry face;Wash, rinse, dry hands;Oral care, brush teeth, clean dentures Grooming: 5: Set-up assist to open containers FIM - Bathing Bathing Steps Patient Completed: Chest;Right Arm;Left Arm;Abdomen;Front perineal area;Buttocks;Right upper leg;Left upper leg Bathing: 4: Min-Patient completes 8-9 42f 10 parts or 75+ percent FIM - Upper Body Dressing/Undressing Upper body dressing/undressing steps patient completed: Thread/unthread right sleeve of pullover shirt/dresss;Thread/unthread left sleeve of pullover shirt/dress;Put head through opening of pull over shirt/dress;Pull shirt over trunk Upper body dressing/undressing: 5: Set-up assist to: Obtain clothing/put away FIM - Lower Body Dressing/Undressing Lower body dressing/undressing steps patient completed: Thread/unthread right pants leg;Thread/unthread left pants leg;Pull pants up/down Lower body dressing/undressing: 4: Min-Patient completed 75 plus % of tasks FIM - Glass blower/designer Devices: Recruitment consultant Transfers: 4-To toilet/BSC: Min A (steadying Pt. > 75%);4-From toilet/BSC: Min A (steadying Pt. > 75%) FIM - Tub/Shower Transfers Tub/Shower Assistive Devices: 3-in-1 commode;Walker Tub/shower Transfers: 4-Into Tub/Shower: Min A (steadying Pt. > 75%/lift 1 leg);4-Out of Tub/Shower: Min A (steadying Pt. > 75%/lift 1 leg)   Refer to Care Plan for Long Term Goals  Recommendations for other services: None  Discharge Criteria: Patient will be discharged from OT if patient refuses treatment 3 consecutive times without medical reason, if treatment goals not met, if there is a change in medical status, if patient makes no progress towards goals or if patient is discharged from hospital.  The above assessment, treatment plan, treatment alternatives and goals were discussed and mutually agreed upon: by patient  Nicoletta Ba 12/25/2014, 12:27 PM

## 2014-12-26 ENCOUNTER — Encounter (HOSPITAL_COMMUNITY): Payer: Self-pay | Admitting: Occupational Therapy

## 2014-12-26 ENCOUNTER — Inpatient Hospital Stay (HOSPITAL_COMMUNITY): Payer: Medicaid Other | Admitting: Physical Therapy

## 2014-12-26 ENCOUNTER — Inpatient Hospital Stay (HOSPITAL_COMMUNITY): Payer: Medicaid Other | Admitting: Occupational Therapy

## 2014-12-26 DIAGNOSIS — E119 Type 2 diabetes mellitus without complications: Secondary | ICD-10-CM

## 2014-12-26 DIAGNOSIS — E669 Obesity, unspecified: Secondary | ICD-10-CM

## 2014-12-26 DIAGNOSIS — S14104S Unspecified injury at C4 level of cervical spinal cord, sequela: Secondary | ICD-10-CM

## 2014-12-26 DIAGNOSIS — C720 Malignant neoplasm of spinal cord: Secondary | ICD-10-CM

## 2014-12-26 DIAGNOSIS — K913 Postprocedural intestinal obstruction: Secondary | ICD-10-CM

## 2014-12-26 DIAGNOSIS — I10 Essential (primary) hypertension: Secondary | ICD-10-CM

## 2014-12-26 DIAGNOSIS — G8250 Quadriplegia, unspecified: Secondary | ICD-10-CM

## 2014-12-26 LAB — GLUCOSE, CAPILLARY
Glucose-Capillary: 220 mg/dL — ABNORMAL HIGH (ref 70–99)
Glucose-Capillary: 119 mg/dL — ABNORMAL HIGH (ref 70–99)
Glucose-Capillary: 107 mg/dL — ABNORMAL HIGH (ref 70–99)
Glucose-Capillary: 176 mg/dL — ABNORMAL HIGH (ref 70–99)

## 2014-12-26 LAB — URINE MICROSCOPIC-ADD ON

## 2014-12-26 LAB — URINALYSIS, ROUTINE W REFLEX MICROSCOPIC
Bilirubin Urine: NEGATIVE
Glucose, UA: NEGATIVE mg/dL
Ketones, ur: NEGATIVE mg/dL
Nitrite: POSITIVE — AB
Protein, ur: 30 mg/dL — AB
Specific Gravity, Urine: 1.011 (ref 1.005–1.030)
Urobilinogen, UA: 1 mg/dL (ref 0.0–1.0)
pH: 6 (ref 5.0–8.0)

## 2014-12-26 MED ORDER — MORPHINE SULFATE ER 15 MG PO TBCR
15.0000 mg | EXTENDED_RELEASE_TABLET | Freq: Two times a day (BID) | ORAL | Status: DC
  Administered 2014-12-26 – 2015-01-02 (×15): 15 mg via ORAL
  Filled 2014-12-26 (×15): qty 1

## 2014-12-26 MED ORDER — POLYETHYLENE GLYCOL 3350 17 G PO PACK
17.0000 g | PACK | Freq: Two times a day (BID) | ORAL | Status: DC
  Administered 2014-12-27 – 2014-12-29 (×5): 17 g via ORAL
  Filled 2014-12-26 (×11): qty 1

## 2014-12-26 MED ORDER — GABAPENTIN 100 MG PO CAPS
200.0000 mg | ORAL_CAPSULE | Freq: Three times a day (TID) | ORAL | Status: DC
  Administered 2014-12-26 – 2014-12-29 (×9): 200 mg via ORAL
  Filled 2014-12-26 (×12): qty 2

## 2014-12-26 MED ORDER — INSULIN GLARGINE 100 UNIT/ML ~~LOC~~ SOLN
20.0000 [IU] | Freq: Every day | SUBCUTANEOUS | Status: DC
  Administered 2014-12-26 – 2014-12-27 (×2): 20 [IU] via SUBCUTANEOUS
  Filled 2014-12-26 (×3): qty 0.2

## 2014-12-26 MED ORDER — ENOXAPARIN SODIUM 80 MG/0.8ML ~~LOC~~ SOLN
75.0000 mg | Freq: Every day | SUBCUTANEOUS | Status: DC
  Administered 2014-12-27 – 2015-01-02 (×7): 75 mg via SUBCUTANEOUS
  Filled 2014-12-26 (×10): qty 0.8

## 2014-12-26 MED ORDER — DICLOFENAC SODIUM 1 % TD GEL
2.0000 g | Freq: Three times a day (TID) | TRANSDERMAL | Status: DC
  Administered 2014-12-26 – 2015-01-02 (×20): 2 g via TOPICAL
  Filled 2014-12-26 (×2): qty 100

## 2014-12-26 NOTE — Progress Notes (Signed)
[facility]ILITATION     PROGRESS NOTE    Subjective/Complaints: Having a lot of neck and right shoulder pain. Knees also sore.  Objective: Vital Signs: Blood pressure 141/81, pulse 102, temperature 98.3 F (36.8 C), temperature source Oral, resp. rate 18, height 5\' 4"  (1.626 m), weight 152.3 kg (335 lb 12.2 oz), last menstrual period 12/16/2014, SpO2 99 %. Dg Abd 1 View  12/24/2014   CLINICAL DATA:  Initial encounter for vomiting for several days.  EXAM: ABDOMEN - 1 VIEW  COMPARISON:  12/21/2014  FINDINGS: Two supine views. Both moderately degraded by patient body habitus. No bowel distention to suggest obstruction. Gas within normal caliber large and small bowel. No gross free intraperitoneal air.  IMPRESSION: Moderate degradation, secondary to patient body habitus.  Improvement in bowel distention since the prior exam. No gross bowel obstruction identified.   Electronically Signed   By: Abigail Miyamoto M.D.   On: 12/24/2014 17:45    Recent Labs  12/25/14 1030  WBC 11.0*  HGB 9.1*  HCT 28.5*  PLT 349    Recent Labs  12/25/14 1030  NA 138  K 3.8  CL 103  GLUCOSE 184*  BUN <5*  CREATININE 0.64  CALCIUM 8.3*   CBG (last 3)   Recent Labs  12/25/14 1709 12/25/14 2059 12/26/14 0658  GLUCAP 150* 127* 220*    Wt Readings from Last 3 Encounters:  12/24/14 152.3 kg (335 lb 12.2 oz)  12/05/14 145.151 kg (320 lb)  11/19/14 155.674 kg (343 lb 3.2 oz)    Physical Exam:  Constitutional: She is oriented to person, place, and time. She appears well-developed and well-nourished.  Morbidly obese female. No distress HENT: oral mucosa moist, mild white coating on tongue Head: Normocephalic and atraumatic.  Eyes: Conjunctivae are normal. Pupils are equal, round, and reactive to light.  Neck: Normal range of motion. Neck supple.  Cardiovascular: Normal rate and regular rhythm.  Respiratory: Effort normal and breath sounds normal. No respiratory  distress. She has no wheezes.  GI: Soft. She exhibits no distension. Bowel sounds are scarce. There is no tenderness.  Musculoskeletal: She exhibits no edema or tenderness.  Neurological: She is alert and oriented to person, place, and time.  Speech clear. Follows basic commands without difficulty.cognitively intact.  Right upper ext 4/5 delt, tricep, bicep, hand. LUE: 4+ to 5/5 prox to distal. RLE: 1+ to 2-/5 HF and KE and 1/5 ankle. LLE 3 hf, ke and 4/5 ankle. Sensation 1/2 RUE and RLE---senses pinch. Has 1+ to 2/2 trunk and left upper and lower. DTR's absent  Skin: Skin is warm and dry. Surgical site clean with steristrips. Psych: pt is pleasant and cooperative.   Assessment/Plan: 1. 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 Physiatrist is providing close team supervision and 24 hour management of active medical problems listed below. Physiatrist and rehab team continue to assess barriers to discharge/monitor patient progress toward functional and medical goals. FIM: FIM - Bathing Bathing Steps Patient Completed: Chest, Right Arm, Left Arm, Abdomen, Front perineal area, Buttocks, Right upper leg, Left upper leg Bathing: 4: Min-Patient completes 8-9 73f 10 parts or 75+ percent  FIM - Upper Body Dressing/Undressing Upper body dressing/undressing steps patient completed: Thread/unthread right sleeve of pullover shirt/dresss, Thread/unthread left sleeve of pullover shirt/dress, Put head through opening of pull over shirt/dress, Pull shirt over trunk Upper body dressing/undressing:  5: Set-up assist to: Obtain clothing/put away FIM - Lower Body Dressing/Undressing Lower body dressing/undressing steps patient completed: Thread/unthread right pants leg, Thread/unthread left pants leg, Pull pants up/down Lower body dressing/undressing: 4:  Min-Patient completed 75 plus % of tasks  FIM - Toileting Toileting steps completed by patient: Adjust clothing prior to toileting, Performs perineal hygiene, Adjust clothing after toileting Toileting Assistive Devices: Grab bar or rail for support Toileting: 4: Steadying assist  FIM - Radio producer Devices: Recruitment consultant Transfers: 4-To toilet/BSC: Min A (steadying Pt. > 75%), 4-From toilet/BSC: Min A (steadying Pt. > 75%)  FIM - Bed/Chair Transfer Bed/Chair Transfer: 4: Supine > Sit: Min A (steadying Pt. > 75%/lift 1 leg), 4: Sit > Supine: Min A (steadying pt. > 75%/lift 1 leg), 4: Bed > Chair or W/C: Min A (steadying Pt. > 75%), 4: Chair or W/C > Bed: Min A (steadying Pt. > 75%)  FIM - Locomotion: Wheelchair Distance: 50 Locomotion: Wheelchair: 2: Travels 50 - 149 ft with minimal assistance (Pt.>75%) FIM - Locomotion: Ambulation Ambulation/Gait Assistance: 4: Min guard Locomotion: Ambulation: 2: Travels 50 - 149 ft with minimal assistance (Pt.>75%)  Comprehension Comprehension Mode: Auditory Comprehension: 6-Follows complex conversation/direction: With extra time/assistive device  Expression Expression Mode: Verbal Expression: 6-Expresses complex ideas: With extra time/assistive device  Social Interaction Social Interaction: 6-Interacts appropriately with others with medication or extra time (anti-anxiety, antidepressant).  Problem Solving Problem Solving: 6-Solves complex problems: With extra time  Memory Memory: 7-Complete Independence: No helper Medical Problem List and Plan: 1. 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 DVT Prophylaxis/Anticoagulation: Pharmaceutical: Lovenox 3. Pain Management: oxycodone, tylenol -baclofen for spasms  -add ms contin 15mg  q12  -voltaren gel for knees  -gabapentin for neuropathic pain---increase to 200mg  tid 4. Mood: LCSW to follow for  revaluation and support.  5. Neuropsych: This patient is capable of making decisions on her own behalf. 6. Skin/Wound Care: Monitor wound daily. Routine pressure relief measures.  7. Fluids/Electrolytes/Nutrition: Monitor I/O. Check labs today 8. Ileus: Scheduled Reglan ac/hs and monitor for diet tolerance. Scheduled suppository  -KUB with improvement  -diet upgraded 9. BTD:VVOH monitor every 8 hours. Resume metoprolol to help with BP as well as heart rate control.  10. DM type 2: Was on lantus 50 units with novolog tid and metformin at home. Will monitor BS with achs checks. Resumed lantus 10 units at bedtime ---increase to 20 units 11. Hyponatremia: Multifactorial due to diet, N/V. Will recheck today     LOS (Days) 2 A FACE TO FACE EVALUATION WAS PERFORMED  SWARTZ,ZACHARY T 12/26/2014 8:47 AM

## 2014-12-26 NOTE — Progress Notes (Signed)
Physical Therapy Session Note  Patient Details  Name: Cynthia Rosales MRN: [id number] Date of Birth: [birth year]  Today's Date: 12/26/2014 PT Individual Time: 1030-1130 Treatment Session 2: 1405-1505 PT Individual Time Calculation (min): 60 min  Treatment Session 2: 60 min  Short Term Goals: Week 1:  PT Short Term Goal 1 (Week 1): Pt will ambulate 150' w/ SBA w/ no w/c follow (Pt will ambulate 150' w/ SBA w/ no w/c follow) PT Short Term Goal 2 (Week 1): Pt will complete all bed mobility w/ SBA from flat bed w/ no rails PT Short Term Goal 3 (Week 1): Pt will improve 8 points on Berg Balance Test PT Short Term Goal 4 (Week 1): Pt will ambulate 1 flight of stairs w/ MinA and 1 rail  Skilled Therapeutic Interventions/Progress Updates:    Gait Training: PT instructs pt in ambulation with bari RW and R Hand Orthosis (to decrease R wrist pain) x 175' x 2 reps req CGA, progressing to SBA for safety - slow gait speed and decreased R foot clearance compared to L - no LOB.   Therapeutic Exercise: PT instructs pt in Nu-Step at L3 for B LE strengthening (arms used for AAROM as needed) x 11 minutes - multiple rest breaks required due to pain and weakness (mostly in R LE). Pt req significant set up in order to prevent bottom from sliding forward off of slick seating surface.  PT instructs pt in B wrist stretch into flexion to decrease c/o carpal tunnel pain x 3 0seconds each.   Treatment Session 2: W/C Management: PT obtains elevating legrests for pt's w/c in order to assist with edema management and demonstrates to pt how to elevate, lower, release, and lock legrests and pt verbalizes understanding. PT fits the legrests to a comfortable position for pt's legs.   Gait Training: PT administers the 6MWT and pt is able to ambulate 257' with SBA, using a RW with R hand orthosis.  PT instructs pt in ascending/descending 6 stairs with B rails req CGA-min A for safety and verbal cues for technique - pt reports  she is mostly limited by pain in her R leg, although that leg is weak, as well.   Therapeutic Activity: PT instructs pt in car transfer with bari RW and R HO req SBA to transfer into the car - Pt reports she will probably leave the hospital in a taxi and so PT simulates a low car height and a backseat transfer. Pt req min A to stand from low car, using R arm on backrest and L arm on RW to transfer back to w/c.  Upon returning to room, pt reports she needs to use the bathroom and so PT provides SBA to help pt transfer to toilet with B grab bars and RW with R HO. Pt agrees to call for assistance when she is done.   Pt is progressing with functional mobility - stairs appear to be the biggest barrier, but pt reports her boyfriend and family can assist her with these. PT plans on teach pt stairs with one family member on each side for safety due to pt's large size when ascending/descending 4 stairs without rails. Currently, pt has heavy reliance on rails to complete stairs. Pain is pt's limiting factor in functional mobility on the unit. Continue per PT POC.   Therapy Documentation Precautions:  Precautions Precautions: Fall, Cervical Required Braces or Orthoses: Cervical Brace Cervical Brace: Soft collar, For comfort Restrictions Weight Bearing Restrictions: No  Pain: Pain Assessment  Pain Assessment: 0-10 Pain Score: 10-Worst pain ever Pain Type: Surgical pain Pain Location: Neck Pain Orientation: Posterior Pain Descriptors / Indicators: Sharp;Stabbing;Burning;Radiating (radiates down middle of spine to bottom of butt) Pain Onset: On-going Pain Intervention(s): Other (Comment) (soft collar worn in standing/upright positioning) Multiple Pain Sites: Yes 2nd Pain Site Pain Score: 10 Pain Type: Chronic pain ("carpal tunnel syndrome" for 3 years) Pain Location: Wrist Pain Orientation: Right Pain Descriptors / Indicators: Burning;Aching (stiffness and popping) Pain Onset: On-going Pain  Intervention(s): Other (Comment) (PT showed pt stretches for wrist to decrease pain) 3rd Pain Site Pain Score: 10 Pain Type: Acute pain;Neuropathic pain Pain Location: Leg Pain Orientation: Right;Left (Right worse than Left) Pain Descriptors / Indicators: Burning;Shooting;Aching Pain Onset: On-going Pain Intervention(s): Emotional support;Rest Treatment Session 2: Pt c/o 10/10 pain in neck - heating pad is used to decrease pain.   See FIM for current functional status  Therapy/Group: Individual Therapy  Laurianne Floresca M 12/26/2014, 11:12 AM

## 2014-12-26 NOTE — Progress Notes (Signed)
Occupational Therapy Session Note  Patient Details  Name: Cynthia Rosales MRN: [id number] Date of Birth: Jul 20, 1981  Today's Date: 12/26/2014 OT Individual Time: 4128-7867 OT Individual Time Calculation (min): 30 min  and Today's Date: 12/26/2014 OT Missed Time: 30 Minutes Missed Time Reason: Patient ill (comment) (nausea)   Short Term Goals: Week 1:  OT Short Term Goal 1 (Week 1): Pt will perform toileting with supervision OT Short Term Goal 2 (Week 1): Pt will don LB clothing with supervison with AE prn OT Short Term Goal 3 (Week 1): Pt will perform bed mobility with supervision without bed rails and from a flat bed OT Short Term Goal 4 (Week 1): Pt will perform kitchen task with superivision sit to stand   Skilled Therapeutic Interventions/Progress Updates:  Upon entering the room, pt seated in recliner chair with reported 10/10 neck pain. Soft neck brace already donned and heat applied to the area. Pt reporting nausea upon therapist entering the room as well . OT provided diet ginger ale and cold compress to head.Therapist discussed pain management with patient in order for her to gain knowledge of medications and alternative techniques to manage pain. Pt and therapist discussed what types of things have alleviated pain vs increased pain symptoms. Pt also reporting fatigue from therapy session while being here. OT began education of energy conservation techniques. Pt began to have increased nausea and grabbing for basin during session. OT notified RN of these symptoms and pt reporting she could not continue session. Pt refused toileting as well. Pt remained reclined in chair with call bell and all needed items within reach upon exiting the room.   Therapy Documentation Precautions:  Precautions Precautions: Fall, Cervical Required Braces or Orthoses: Cervical Brace Cervical Brace: Soft collar, For comfort Restrictions Weight Bearing Restrictions: No General: General OT Amount of Missed  Time: 30 Minutes  Pain: Pain Assessment Pain Assessment: 0-10 Pain Score: 10-Worst pain ever Pain Type: Acute pain Pain Location: Neck Pain Orientation: Posterior Pain Descriptors / Indicators: Sharp;Nagging;Aching Pain Onset: On-going Pain Intervention(s): Medication (See eMAR);Heat applied  ADL: ADL ADL Comments: see FIM  See FIM for current functional status  Therapy/Group: Individual Therapy  Phineas Semen 12/26/2014, 1:34 PM

## 2014-12-26 NOTE — Progress Notes (Signed)
Occupational Therapy Session Note  Patient Details  Name: Cynthia Rosales MRN: [id number] Date of Birth: 1981/02/15  Today's Date: 12/26/2014 OT Individual Time: 0900-1000 OT Individual Time Calculation (min): 60 min    Short Term Goals: Week 1:  OT Short Term Goal 1 (Week 1): Pt will perform toileting with supervision OT Short Term Goal 2 (Week 1): Pt will don LB clothing with supervison with AE prn OT Short Term Goal 3 (Week 1): Pt will perform bed mobility with supervision without bed rails and from a flat bed OT Short Term Goal 4 (Week 1): Pt will perform kitchen task with superivision sit to stand   Skilled Therapeutic Interventions/Progress Updates:    1:1  Pt in bed when arrived.  Pt  Had reported she had an accident with her bladder but didn't report it to anyone so she slept in soiled bed last night.  Urine with a fowl odor.  RN made and aware and came in to assess the situation. Self care retraining at shower level. Pt required more than reasonable amt of time to perform sit to stand from EOB due to pain in LEs right>left but wanted to continued. Introduced Secondary school teacher to assess with gathering clothes. Pt ambulated to dresses and focused on dynamic standing balance to retreive clothing from 2nd drawer with reacher.  Pt bathed sit to stand with steady A; using long handled sponge for LB and back. Pt stands and slings wash cloth in between LEs to get peri area clean- able to perform with steady A. Pt ambulated back to room with steady A. Requested to don  Soft collar due to 9/10 pain - RN aware. Completed grooming in seated position at sink. Left pt with RN   Therapy Documentation Precautions:  Precautions Precautions: Fall, Cervical Required Braces or Orthoses: Cervical Brace Cervical Brace: Soft collar, For comfort Restrictions Weight Bearing Restrictions: No Pain: Pain Assessment Pain Assessment: 0-10 Pain Score: 10-Worst pain ever Pain Type: Surgical pain Pain Location:  Neck Pain Orientation: Posterior Pain Descriptors / Indicators: Sharp;Stabbing;Burning;Radiating (radiates down middle of spine to bottom of butt) Pain Onset: On-going Pain Intervention(s): Other (Comment) (soft collar worn in standing/upright positioning) Multiple Pain Sites: Yes 2nd Pain Site Pain Score: 10 Pain Type: Chronic pain ("carpal tunnel syndrome" for 3 years) Pain Location: Wrist Pain Orientation: Right Pain Descriptors / Indicators: Burning;Aching (stiffness and popping) Pain Onset: On-going Pain Intervention(s): Other (Comment) (PT showed pt stretches for wrist to decrease pain) 3rd Pain Site Pain Score: 10 Pain Type: Acute pain;Neuropathic pain Pain Location: Leg Pain Orientation: Right;Left (Right worse than Left) Pain Descriptors / Indicators: Burning;Shooting;Aching Pain Onset: On-going Pain Intervention(s): Emotional support;Rest ADL: ADL ADL Comments: see FIM  See FIM for current functional status  Therapy/Group: Individual Therapy  Willeen Cass [facility] 12/26/2014, 11:23 AM

## 2014-12-26 NOTE — Progress Notes (Signed)
Pt complaining of nausea and had episode of large undigested emesis at 1320. Pt states she does not feel well and feels weak. Unable to participate in 1300 therapy due to her nausea.  Pt received suppository 1800 1-26 with a small liquid stool result. No further BMs.  Throughout shift I have recommenced and encouraged her to have a PRN fleets enema to help empty her bowels. I educated on importance of having bowel movements regularly especially relating to her past ilius. Pt continues to refuse enema. Notified Algis Liming, PA with pts N/V episode and pt refusing enema as well. Will continue to educate and monitor.

## 2014-12-26 NOTE — Progress Notes (Signed)
Social Work  Social Work Assessment and Plan  Patient Details  Name: Cynthia Rosales MRN: [id number] Date of Birth: 05/09/1981  Today's Date: 12/26/2014  Problem List:  Patient Active Problem List   Diagnosis Date Noted  . Ileus, postoperative 12/25/2014  . Sickle cell disease without crisis 12/25/2014  . Diabetes mellitus type 2 in obese 12/25/2014  . Ependymoma of spinal cord 12/24/2014  . Tetraplegia 12/24/2014  . C4 spinal cord injury 12/24/2014  . Ileus of unspecified type   . Atelectasis   . Bowel obstruction   . Encounter for central line care   . Encounter for nasogastric (NG) tube placement   . Ileus   . Intestinal occlusion   . 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  . Headache 11/19/2014  . Neck pain 11/19/2014  . Right sided weakness 11/19/2014  . Paresthesias 11/19/2014  . Nightmares 09/11/2014  . HTN (hypertension), benign 03/28/2014  . Nephrolithiasis 03/27/2014  . Diabetes mellitus 03/27/2014  . Abdominal pain 03/27/2014   Past Medical History:  Past Medical History  Diagnosis Date  . Hypertension   . Asthma   . Type II diabetes mellitus   . History of blood transfusion     "after I had one of my kids"  . Sickle cell disease   . Anemia   . Depression   . Ovarian cyst   . Renal disorder     kidney stones   Past Surgical History:  Past Surgical History  Procedure Laterality Date  . Cesarean section  2000; 2007; 2011  . Dilation and curettage of uterus    . Appendectomy  2013  . Reduction mammaplasty Bilateral 1998  . Laminectomy N/A 12/13/2014    Procedure: CERVICAL LAMINECTOMY FOR INTRADURAL TUMOR;  Surgeon: Charlie Pitter, MD;  Location: [facility];  Service: Neurosurgery;  Laterality: N/A;  posterior   Social History:  reports that she has never smoked. She has never used smokeless tobacco. She reports that she drinks alcohol. She reports that she does not use illicit drugs.  Family / Support Systems Marital Status: Single Patient Roles: Parent,  Other (Comment) (together with boyfriend x 1 1/2 yrs) Spouse/Significant Other: boyfriend, Ladora Daniel (lives w/ pt and her children) @ [phone] Children: pt has three sons ages 52, 60 and 79 yrs all living in the home with her and bf Other Supports: friend, Blair Promise (local) @ (H) [phone] or (C) [phone];  mother living in [place];  aunt, Donnajean Lopes @ [phone] [facility]) and aunt, Tana Felts [place]) @ ([phone] Anticipated Caregiver: boyfriend Ability/Limitations of Caregiver: BF is mentally disabled and currently not working and can assist.  Pt denies any concerns about the support he is able to provide for her at home. Caregiver Availability: 24/7 Family Dynamics: Pt referring to her boyfriend as "I finally got a good one.  I am blessed."  She denies any issues at home between herself and boyfriend.  She does, however, report that boyfriend and her eldest son are "knocking heads.Marland KitchenMarland KitchenI need to get home..."  Social History Preferred language: English Religion: Baptist Cultural Background: NA Read: Yes Write: Yes Employment Status: Unemployed Date Retired/Disabled/Unemployed: 4 yrs Freight forwarder Issues: None Guardian/Conservator: None - per MD pt capable of making decisions on her own behalf   Abuse/Neglect Physical Abuse: Denies Verbal Abuse: Denies Sexual Abuse: Denies Exploitation of patient/patient's resources: Denies Self-Neglect: Denies  Emotional Status Pt's affect, behavior adn adjustment status: Pt very pleasant and able to complete interview without difficulty.  Speaks  openly about her fears for future recovery, however, recognizes that she is making good gains in therapy.  Denies any significant emotional distress but will monitor and refer to neuropsychology if warranted. Recent Psychosocial Issues: Pt reports that her eldest, teen-aged son is causing some "problems" at home with her boyfriend.  "He's trying to act out while I'm  here..."  Notes she is eager to get home "to straighten him out..."  Financial stressors have been ongoing for some time as pt has not worked x 4 yrs. Pyschiatric History: None Substance Abuse History: None  Patient / Family Perceptions, Expectations & Goals Pt/Family understanding of illness & functional limitations: Pt reports, "I had a tumor on my spine.  I thought it was in the lower part of my back but they said it was up higher."  Basic understanding of the resulting functional limitations she now has due to the surgery and need for CIR. Premorbid pt/family roles/activities: Pt with declining function for several months PTA.  Boyfriend was performing most of the home maintenance. Anticipated changes in roles/activities/participation: Little change anticipated as tx team has goals of modified independent Pt/family expectations/goals: "I just hope I get feeling back in the legs and my hand is better (grasp)"  [employer]: Other (Comment) (Section 8 Housing) Premorbid Home Care/DME Agencies: None Transportation available at discharge: yes Resource referrals recommended: Neuropsychology  Discharge Planning Living Arrangements: Spouse/significant other, Children Support Systems: Children, Spouse/significant other, Other relatives, Friends/neighbors Type of Residence: Private residence Insurance Resources: [employer] (specify county) Pensions consultant: Other (Comment) (boyfriend's SSD is only income;  family occasionally assists) Financial Screen Referred: No Living Expenses: Rent Money Management: Patient Does the patient have any problems obtaining your medications?: No Home Management: pt and family/ boyfriend Patient/Family Preliminary Plans: Pt plans to d/c home with boyfriend and three sons.  Boyfriend on SSD and able to provide 24/7 assist. Barriers to Discharge: Steps Social Work Anticipated Follow Up Needs: HH/OP Expected length of stay: 7-10  days  Clinical Impression Very pleasant woman here following surgical removal of a spinal tumor and making good gains with therapies.  Anticipate short LOS and mod i goals.  Reports some stressors at home with eldest, teen's behavior and she is eager to return home to "...straighten him out...".  States her boyfriend is very supportive and able to assist in any way she needs.  Will follow for d/c planning, resource referral and support.  Yanil Dawe 12/26/2014, 1:28 PM

## 2014-12-27 ENCOUNTER — Inpatient Hospital Stay (HOSPITAL_COMMUNITY): Payer: Medicaid Other | Admitting: Physical Therapy

## 2014-12-27 ENCOUNTER — Inpatient Hospital Stay (HOSPITAL_COMMUNITY): Payer: Medicaid Other

## 2014-12-27 ENCOUNTER — Inpatient Hospital Stay (HOSPITAL_COMMUNITY): Payer: Medicaid Other | Admitting: Occupational Therapy

## 2014-12-27 ENCOUNTER — Inpatient Hospital Stay (HOSPITAL_COMMUNITY): Payer: Medicaid Other | Admitting: *Deleted

## 2014-12-27 LAB — GLUCOSE, CAPILLARY
Glucose-Capillary: 264 mg/dL — ABNORMAL HIGH (ref 70–99)
Glucose-Capillary: 89 mg/dL (ref 70–99)
Glucose-Capillary: 103 mg/dL — ABNORMAL HIGH (ref 70–99)
Glucose-Capillary: 121 mg/dL — ABNORMAL HIGH (ref 70–99)

## 2014-12-27 MED ORDER — WHITE PETROLATUM GEL
Status: AC
  Administered 2014-12-27: 10:00:00
  Filled 2014-12-27: qty 1

## 2014-12-27 NOTE — Progress Notes (Signed)
Occupational Therapy Session Note  Patient Details  Name: Cynthia Rosales MRN: [id number] Date of Birth: 09-30-1981  Today's Date: 12/27/2014 OT Individual Time: 4098-1191 OT Individual Time Calculation (min): 60 min    Short Term Goals: Week 1:  OT Short Term Goal 1 (Week 1): Pt will perform toileting with supervision OT Short Term Goal 2 (Week 1): Pt will don LB clothing with supervison with AE prn OT Short Term Goal 3 (Week 1): Pt will perform bed mobility with supervision without bed rails and from a flat bed OT Short Term Goal 4 (Week 1): Pt will perform kitchen task with superivision sit to stand   Skilled Therapeutic Interventions/Progress Updates:  Upon entering the room, pt seated in recliner chair with 10/10 pain reported in neck. Pt also reporting pain medication recently given and heat applied to area for relief. Pt performing STS from recliner chair with RW and supervision to ambulate into bathroom. Pt with 1 slight LOB during clothing management but able to self correct and remained supervision level for toilet task. Toilet transfer also with supervision this session and use of bari RW. Pt propelled in wheelchair by therapist to gift shop downstairs in order to conserve energy. Once at gift shop, pt ambulated with bari RW down aisles, around obstacles, and other shoppers in order to increase I with community mobility tasks. Pt standing for a total of ~ 13 minutes while in gift shop area before requiring rest break secondary to fatigue. OT assisted pt back to floor via wheelchair for energy conservation but pt then ambulating ~ 200 ft with bari RW and supervision back to room. Pt seated in recliner chair with call bell and all needed items within reach. Pt wishing to speak to RN and RN alerted by therapist prior to exiting.   Therapy Documentation Precautions:  Precautions Precautions: Fall, Cervical Required Braces or Orthoses: Cervical Brace Cervical Brace: Soft collar, For  comfort Restrictions Weight Bearing Restrictions: No Vital Signs: Therapy Vitals Temp: 97.5 F (36.4 C) Temp Source: Oral Pulse Rate: 98 Resp: 18 BP: (!) 145/84 mmHg Patient Position (if appropriate): Lying Oxygen Therapy SpO2: 99 % O2 Device: Not Delivered Pain: Pain Assessment Pain Assessment: 0-10 Pain Score: 10-Worst pain ever Pain Type: Surgical pain Pain Location: Neck Pain Orientation: Posterior Pain Descriptors / Indicators: Burning Pain Onset: On-going Pain Intervention(s): Other (Comment);Medication (See eMAR), heat applied ADL: ADL ADL Comments: see FIM  See FIM for current functional status  Therapy/Group: Individual Therapy  Phineas Semen 12/27/2014, 3:09 PM

## 2014-12-27 NOTE — Progress Notes (Signed)
Recreational Therapy Assessment and Plan  Patient Details  Name: Cynthia Rosales MRN: [id number] Date of Birth: [birth year] Today's Date: 12/27/2014  Rehab Potential: Good ELOS: 2 weeks   Assessment Clinical Impression: Problem List:  Patient Active Problem List   Diagnosis Date Noted  . Ileus, postoperative 12/25/2014  . Sickle cell disease without crisis 12/25/2014  . Diabetes mellitus type 2 in obese 12/25/2014  . Ependymoma of spinal cord 12/24/2014  . Tetraplegia 12/24/2014  . C4 spinal cord injury 12/24/2014  . Ileus of unspecified type   . Atelectasis   . Bowel obstruction   . Encounter for central line care   . Encounter for nasogastric (NG) tube placement   . Ileus   . Intestinal occlusion   . 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  . Headache 11/19/2014  . Neck pain 11/19/2014  . Right sided weakness 11/19/2014  . Paresthesias 11/19/2014  . Nightmares 09/11/2014  . HTN (hypertension), benign 03/28/2014  . Nephrolithiasis 03/27/2014  . Diabetes mellitus 03/27/2014  . Abdominal pain 03/27/2014    Past Medical History:  Past Medical History  Diagnosis Date  . Hypertension   . Asthma   . Type II diabetes mellitus   . History of blood transfusion     "after I had one of my kids"  . Sickle cell disease   . Anemia   . Depression   . Ovarian cyst   . Renal disorder     kidney stones   Past Surgical History:  Past Surgical History  Procedure Laterality Date  . Cesarean section  2000; 2007; 2011  . Dilation and curettage of uterus    . Appendectomy  2013  . Reduction mammaplasty Bilateral 1998  . Laminectomy N/A 12/13/2014    Procedure: CERVICAL LAMINECTOMY FOR INTRADURAL TUMOR; Surgeon: Charlie Pitter, MD; Location: [facility]; Service: Neurosurgery; Laterality: N/A; posterior    Assessment & Plan Clinical Impression:Cynthia  Rosales is a 34 y.o. female with history of HTN, DM, morbid obesity, SSD, progressive neck pain with radiation to BUE and recent MRI with C4-C5 expansile mass. She was admitted for further work up via [place] office on 12/10/14 due reports of recent loss of B/B function with progressive quadreparesis and was started on IV steroids. 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 She was taken to OR on 12/13/13 for C3-C5 laminectomy with resection of intramedullary tumor by Dr. Annette Stable. Pathology positive for ependymoma. Post op course complicated by abdominal pian due to paralytic ileus or enteritis and CCM consulted for assistacne. She was made NPO with TNA for nutritional supplement and NGT placed for decompression. Hypotension treated with IVF. Follow up KUB with increasing small bowel dilatation but patient has had improvement in GI symptoms. NGT d/c and patient advanced to regular diet yesterday and has had some N/V per reports. She has had improvement in tetraplegia but continues to have RLE > LLE weakness affecting mobility. Therapy has been ongoing with patient showing good motivation and steady progress. CIR was recommended for follow up therapy and patient medically ready today for admission. Patient transferred to CIR on 12/24/2014.   Pt presents with decreased activity tolerance, decreased functional mobility, decreased balance, decreased coordination, increased pain Limiting pt's independence with leisure/community pursuits.   Leisure History/Participation Premorbid leisure interest/current participation: Medical laboratory scientific officer - Building control surveyor - Shopping mall Other Leisure Interests: Television;Movies;Cooking/Baking Leisure Participation Style: With Family/Friends;Alone Awareness of Community Resources: Fair-identify 2 post discharge leisure resources Psychosocial /  Spiritual Patient agreeable to Pet Therapy: Yes Does patient have  pets?: No Social interaction - Mood/Behavior: Cooperative Academic librarian Appropriate for Education?: Yes Patient Agreeable to Outing?: Yes Recreational Therapy Orientation Orientation -Reviewed with patient: Available activity resources Strengths/Weaknesses Patient Strengths/Abilities: Willingness to participate Patient weaknesses: Physical limitations;Minimal Premorbid Leisure Activity TR Patient demonstrates impairments in the following area(s): Edema;Endurance;Motor;Pain;Safety;Sensory;Skin Integrity  Plan Rec Therapy Plan Is patient appropriate for Therapeutic Recreation?: Yes Rehab Potential: Good Treatment times per week: Min 1 time per week>20 minutes Estimated Length of Stay: 2 weeks TR Treatment/Interventions: Adaptive equipment instruction;1:1 session;Balance/vestibular training;Community reintegration;Functional mobility training;Patient/family education;Recreation/leisure participation;Therapeutic activities;UE/LE Coordination activities;Therapeutic exercise;Wheelchair propulsion/positioning Recommendations for other services: Neuropsych  Recommendations for other services: None  Discharge Criteria: Patient will be discharged from TR if patient refuses treatment 3 consecutive times without medical reason.  If treatment goals not met, if there is a change in medical status, if patient makes no progress towards goals or if patient is discharged from hospital.  The above assessment, treatment plan, treatment alternatives and goals were discussed and mutually agreed upon: by patient  [place] 12/27/2014, 11:53 AM

## 2014-12-27 NOTE — IPOC Note (Signed)
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   Diabetes mellitus type 2 in obese     Functional Problem List: Nursing Bowel, Medication Management, Nutrition, Pain, Safety, Sensory  PT Balance, Endurance, Motor, Pain, Sensory, Skin Integrity  OT Balance, Endurance, Motor, Pain, Perception, Safety, Sensory  SLP    TR Edema, Endurance, Motor, Pain, Safety, Sensory, Skin Integrity       Basic ADL's: OT Grooming, Bathing, Dressing, Toileting     Advanced  ADL's: OT Simple Meal Preparation     Transfers: PT Bed Mobility, Bed to Chair, Car, Manufacturing systems engineer, Metallurgist: PT Ambulation, Stairs     Additional Impairments: OT Fuctional Use of Upper Extremity  SLP        TR      Anticipated Outcomes Item Anticipated Outcome  Self Feeding    Swallowing      Basic self-care  overall mod I   Toileting  mod I    Bathroom Transfers mod I   Bowel/Bladder  manage bowel and bladder minimal assist  Transfers  mod (I)  Locomotion  mod (I)  Communication     Cognition     Pain  4 or less  Safety/Judgment  Min assist   Therapy Plan: PT Intensity: Minimum of 1-2 x/day ,45 to 90 minutes PT Frequency: 5 out of 7 days PT Duration Estimated Length of Stay: 10-14 days OT Intensity: Minimum of 1-2 x/day, 45 to 90 minutes OT Frequency: 5 out of 7 days OT Duration/Estimated Length of Stay: 10-12 days         Team Interventions: Nursing Interventions Patient/Family Education, Bowel Management, Disease Management/Prevention, Pain Management, Medication Management, Psychosocial Support  PT interventions Ambulation/gait  training, Training and development officer, Cognitive remediation/compensation, Community reintegration, DME/adaptive equipment instruction, Discharge planning, Functional mobility training, Neuromuscular re-education, Patient/family education, Pain management, Psychosocial support, Skin care/wound management, Stair training, Therapeutic Activities, Therapeutic Exercise, UE/LE Strength taining/ROM, UE/LE Coordination activities  OT Interventions Balance/vestibular training, Discharge planning, DME/adaptive equipment instruction, Functional mobility training, Pain management, Psychosocial support, Skin care/wound managment, Therapeutic Activities, UE/LE Strength taining/ROM, UE/LE Coordination activities, Therapeutic Exercise, Splinting/orthotics, Self Care/advanced ADL retraining, Patient/family education, Neuromuscular re-education, Disease mangement/prevention, Community reintegration  SLP Interventions    TR Interventions Adaptive equipment instruction, 1:1 session, Training and development officer, Academic librarian, Functional mobility training, Barrister's clerk education, Recreation/leisure participation, Therapeutic activities, UE/LE Coordination activities, Therapeutic exercise, Wheelchair propulsion/positioning  SW/CM Interventions Discharge Planning, Barrister's clerk, Patient/Family Education    Team Discharge Planning: Destination: PT-Home ,OT- Home , SLP-  Projected Follow-up: PT-Home health PT, OT-  Outpatient OT, SLP-  Projected Equipment Needs: PT-Rolling walker with 5" wheels, OT- To be determined, SLP-  Equipment Details: PT- , OT-  Patient/family involved in discharge planning: PT- Patient,  OT-Patient, SLP-   MD ELOS: 10-12 days Medical Rehab Prognosis:  Excellent Assessment: The patient has been admitted for CIR therapies with the diagnosis of 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 The team will be addressing functional mobility, strength, stamina, balance, safety,  adaptive techniques and equipment, self-care, bowel and bladder mgt, patient and caregiver education, pain mgt, neuromuscular re-education, neck precautions, ego-support, anxiety control, community reintegration. Goals have been set  at mod I for mobility and self-care.    Meredith Staggers, MD, FAAPMR      See Team Conference Notes for weekly updates to the plan of care

## 2014-12-27 NOTE — Progress Notes (Signed)
Physical Therapy Session Note  Patient Details  Name: Cynthia Rosales MRN: [id number] Date of Birth: 1980-12-30  Today's Date: 12/27/2014 PT Individual Time: 1100-1200 Treatment Session 2: 8413-2440 PT Individual Time Calculation (min): 60 min  Treatment Session 2: 20 min  Short Term Goals: Week 1:  PT Short Term Goal 1 (Week 1): Pt will ambulate 150' w/ SBA w/ no w/c follow (Pt will ambulate 150' w/ SBA w/ no w/c follow) PT Short Term Goal 2 (Week 1): Pt will complete all bed mobility w/ SBA from flat bed w/ no rails PT Short Term Goal 3 (Week 1): Pt will improve 8 points on Berg Balance Test PT Short Term Goal 4 (Week 1): Pt will ambulate 1 flight of stairs w/ MinA and 1 rail  Skilled Therapeutic Interventions/Progress Updates:    Gait Training: PT instructs pt in ambulation with RW and R HO req SBA for safety and verbal cues for shoulder depression x 175' x 2 reps - excessive lateral lean noted due to leg weakness.  Therapeutic Activity: Five times Sit to Stand Test (FTSS) Method: Use a straight back chair with a solid seat that is 16-18" high. Ask participant to sit on the chair with arms folded across their chest.   Instructions: "Stand up and sit down as quickly as possible 5 times, keeping your arms folded across your chest."   Measurement: Stop timing when the participant stands the 5th time.  TIME: __39____ (in seconds): Pt req use of R arm pushing off of mat to achieve standing on each attempt but one.   Times > 13.6 seconds is associated with increased disability and morbidity (Guralnik, 2000) Times > 15 seconds is predictive of recurrent falls in healthy individuals aged 66 and older (Buatois, et al., 2008) Normal performance values in community dwelling individuals aged 3 and older (Bohannon, 2006): o 60-69 years: 11.4 seconds o 70-79 years: 12.6 seconds o 80-89 years: 14.8 seconds  MCID: ? 2.3 seconds for Vestibular Disorders (Meretta, 2006)  Neuromuscular  Reeducation: PT instructs pt in TUG x 3 reps and pt scores: 51 seconds, 36 seconds, and 29 seconds.   Therapeutic Exercise:  PT instructs pt in B LE strengthening and balance exercises in // bars 2 x 10 reps each: mini squats, heel/toe raises, hip abduction : pt demonstrates significant weakness into R hip abduction.   Treatment Session 2: Therapeutic Activity: PT received on toilet, c/o constipation & nausea at beginning of scheduled PT session. PT checked back 15 minutes later and pt requests to return to bed. Pt req CGA to stand from toilet with grab bar, SBA to transfer toilet to bed with RW, min A sit to supine transfer for R LE onto bed, then HOB elevated and pt began vomiting. PT notified RN and assisted pt with emptying vomit bucket, providing a cup of water for pt to swish in mouth, and a cool washcloth to be placed on pt's forehead. Nurse came into room and began caring for pt after that time.    Pt is progressing with activity tolerance and functional mobility, including ability to complete sit to stand from low heights. Pt's balance, pain, and endurance continue to be limited. Pt is very focused on R MCP pain and thinks she may have a fracture - PT notified MD and asked him to address this. Pt's PM session was limited by nausea, constipation, and vomiting. Continue per PT POC.   Therapy Documentation Precautions:  Precautions Precautions: Fall, Cervical Required Braces or Orthoses: Cervical  Brace Cervical Brace: Soft collar, For comfort Restrictions Weight Bearing Restrictions: No General: PT Amount of Missed Time (min): 40 Minutes PT Missed Treatment Reason: Patient ill (Comment) (Constipation with nausea & vomiting) Pain: Pain Assessment Pain Assessment: 0-10 Pain Score: 10-Worst pain ever Pain Type: Surgical pain Pain Location: Neck Pain Orientation: Posterior Pain Descriptors / Indicators: Burning Pain Frequency: Constant Pain Onset: On-going Pain Intervention(s):  Other (Comment);Medication (See eMAR) Multiple Pain Sites: Yes 2nd Pain Site Pain Score: 10 Pain Type: Chronic pain Pain Location: Wrist Pain Orientation: Right Pain Descriptors / Indicators: Burning Pain Onset: On-going Pain Intervention(s): Other (Comment) (HO used with RW) 3rd Pain Site Pain Score: 10 Pain Type: Acute pain;Neuropathic pain Pain Location: Leg Pain Orientation: Right Pain Descriptors / Indicators: Burning Pain Onset: On-going Pain Intervention(s): Rest Treatment Session 2: Pt c/o stomach pain - unrated- due to nausea & vomiting - pt then began vomiting and was assisted back to bed. PT gave pt a cool washcloth to decrease discomfort and RN notified.   Balance: Balance Balance Assessed: Yes Standardized Balance Assessment Standardized Balance Assessment: Timed Up and Go Test Timed Up and Go Test TUG: Normal TUG Normal TUG (seconds): 29  See FIM for current functional status  Therapy/Group: Individual Therapy  Cynthia Rosales 12/27/2014, 11:16 AM

## 2014-12-27 NOTE — Care Management Note (Signed)
Inpatient [facility] Individual Statement of Services  Patient Name:  Cynthia Rosales  Date:  12/26/2014  Welcome to the [facility].  Our goal is to provide you with an individualized program based on your diagnosis and situation, designed to meet your specific needs.  With this comprehensive rehabilitation program, you will be expected to participate in at least 3 hours of rehabilitation therapies Monday-Friday, with modified therapy programming on the weekends.  Your rehabilitation program will include the following services:  Physical Therapy (PT), Occupational Therapy (OT), 24 hour per day rehabilitation nursing, Therapeutic Recreaction (TR), Neuropsychology, Case Management (Social Worker), Rehabilitation Medicine, Nutrition Services and Pharmacy Services  Weekly team conferences will be held on Tuesdays to discuss your progress.  Your Social Worker will talk with you frequently to get your input and to update you on team discussions.  Team conferences with you and your family in attendance may also be held.  Expected length of stay: 7-10 days  Overall anticipated outcome: modified independent  Depending on your progress and recovery, your program may change. Your Social Worker will coordinate services and will keep you informed of any changes. Your Social Worker's name and contact numbers are listed  below.  The following services may also be recommended but are not provided by the [facility] will be made to provide these services after discharge if needed.  Arrangements include referral to agencies that provide these services.  Your insurance has been verified to be:  Medicaid Your primary doctor is:  [facility]  Pertinent information will be shared with your doctor and  your insurance company.  Social Worker:  [place], [place] or ([phone]   Information discussed with and copy given to patient by: Lennart Pall, 12/26/2014, 1:31PM

## 2014-12-27 NOTE — Progress Notes (Signed)
Occupational Therapy Session Note  Patient Details  Name: Cynthia Rosales MRN: [id number] Date of Birth: 1981/03/09  Today's Date: 12/27/2014 OT Individual Time: 9794-8016 OT Individual Time Calculation (min): 60 min    Short Term Goals: Week 1:  OT Short Term Goal 1 (Week 1): Pt will perform toileting with supervision OT Short Term Goal 2 (Week 1): Pt will don LB clothing with supervison with AE prn OT Short Term Goal 3 (Week 1): Pt will perform bed mobility with supervision without bed rails and from a flat bed OT Short Term Goal 4 (Week 1): Pt will perform kitchen task with superivision sit to stand   Skilled Therapeutic Interventions/Progress Updates: ADL-retraining with focus on discharge planning, DME and AE use, endurance, and dynamic standing balance.   Pt received in bed awaiting therapist for planned BADL session.  Pt reported poor appetite d/t complaint of limited food choices although she was able to consume some of her breakfast.  OT re-educated pt on dining options while promoting progression with session.   Pt rose to sit at edge of bed using HOB elevated and bed rail effectively.   With setup to provide RW, pt rose to stand at [facility] and ambulated to bathroom slowly with supervision and w/o evidence of LOB.    Pt requested shower-level BADL and was re-educated on need for practice with standard tub and tub bench during next session.   Pt confirms need for BSC and tub bench to conserve energy and reduce fall risk s/p discharge.   Pt completed bathing seated and stood to wash peri-area and buttocks without need for grab bars to maintain her balance while using both hand effectively without dropping items or complaining of any weakness or pain in right hand.   After bathing, pt returned to w/c to dress with setup assist to don TEDs and initial instruction on use of sock aid completed.    Pt was unable to fit into her shoes d/t marked edema at both feet making wear of shoes uncomfortable.    Pt  requested assist with transfer to recliner (min guard assist required) at end of session and was setup with call light and phone within reach.     Therapy Documentation Precautions:  Precautions Precautions: Fall, Cervical Required Braces or Orthoses: Cervical Brace Cervical Brace: Soft collar, For comfort Restrictions Weight Bearing Restrictions: No  Pain: Pain Assessment Pain Assessment: 0-10 Pain Score: 4  Pain Type: Other (Comment) (incisional discomfort) Pain Location: Neck Pain Orientation: Posterior Pain Descriptors / Indicators: Burning Pain Frequency: Constant Pain Intervention(s): RN made aware;Repositioned  ADL: ADL ADL Comments: see FIM  See FIM for current functional status  Therapy/Group: Individual Therapy  Cynthia Rosales 12/27/2014, 9:57 AM

## 2014-12-27 NOTE — Progress Notes (Signed)
Social Work Patient ID: Cynthia Rosales, female   DOB: 1981-10-23, 34 y.o.   MRN: [id number]   Met with pt yesterday afternoon to review team conference.  Pt aware and agreeable with targeted d/c date of 2/5 and with modified independent goals.  She is pleased with progress she is making and denies any concerns about d/c home.  Also, assisted pt with the start of her SSD application.  Will continue to follow for d/c planning and support needs.  Kataleia Quaranta, LCSW

## 2014-12-27 NOTE — Progress Notes (Signed)
PHYSICAL MEDICINE & REHABILITATION     PROGRESS NOTE    Subjective/Complaints: Had some emesis yesterday. Minor results with supp. States she's taking enema today..pain better  Objective: Vital Signs: Blood pressure 115/66, pulse 99, temperature 98.7 F (37.1 C), temperature source Oral, resp. rate 19, height 5\' 4"  (1.626 m), weight 152.545 kg (336 lb 4.8 oz), last menstrual period 12/16/2014, SpO2 99 %. No results found.  Recent Labs  12/25/14 1030  WBC 11.0*  HGB 9.1*  HCT 28.5*  PLT 349    Recent Labs  12/25/14 1030  NA 138  K 3.8  CL 103  GLUCOSE 184*  BUN <5*  CREATININE 0.64  CALCIUM 8.3*   CBG (last 3)   Recent Labs  12/26/14 1624 12/26/14 2118 12/27/14 0653  GLUCAP 107* 176* 264*    Wt Readings from Last 3 Encounters:  12/26/14 152.545 kg (336 lb 4.8 oz)  12/05/14 145.151 kg (320 lb)  11/19/14 155.674 kg (343 lb 3.2 oz)    Physical Exam:  Constitutional: She is oriented to person, place, and time. She appears well-developed and well-nourished.  Morbidly obese female. No distress HENT: oral mucosa moist, mild white coating on tongue Head: Normocephalic and atraumatic.  Eyes: Conjunctivae are normal. Pupils are equal, round, and reactive to light.  Neck: Normal range of motion. Neck supple.  Cardiovascular: Normal rate and regular rhythm.  Respiratory: Effort normal and breath sounds normal. No respiratory distress. She has no wheezes.  GI: Soft. She exhibits no distension. Bowel sounds are scarce. There is no tenderness.  Musculoskeletal: She exhibits no edema or tenderness.  Neurological: She is alert and oriented to person, place, and time.  Speech clear. Follows basic commands without difficulty.cognitively intact.  Right upper ext 4/5 delt, tricep, bicep, hand. LUE: 4+ to 5/5 prox to distal. RLE: 1+ to 2-/5 HF and KE and 1/5 ankle. LLE 3 hf, ke and 4/5 ankle. Sensation 1/2 RUE and RLE---senses pinch. Has 1+ to 2/2 trunk and  left upper and lower. DTR's absent  Skin: Skin is warm and dry. Surgical site clean with steristrips. Psych: pt is pleasant and cooperative.   Assessment/Plan: 1. 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 Physiatrist is providing close team supervision and 24 hour management of active medical problems listed below. Physiatrist and rehab team continue to assess barriers to discharge/monitor patient progress toward functional and medical goals. FIM: FIM - Bathing Bathing Steps Patient Completed: Chest, Right Arm, Left Arm, Abdomen, Front perineal area, Buttocks, Right upper leg, Left upper leg, Right lower leg (including foot), Left lower leg (including foot) Bathing: 4: Steadying assist  FIM - Upper Body Dressing/Undressing Upper body dressing/undressing steps patient completed: Thread/unthread right sleeve of pullover shirt/dresss, Thread/unthread left sleeve of pullover shirt/dress, Put head through opening of pull over shirt/dress, Pull shirt over trunk Upper body dressing/undressing: 5: Supervision: Safety issues/verbal cues FIM - Lower Body Dressing/Undressing Lower body dressing/undressing steps patient completed: Thread/unthread right pants leg, Thread/unthread left pants leg, Pull pants up/down, Don/Doff left sock, Don/Doff right shoe (able to doff socks with AE but A to don) Lower body dressing/undressing: 4: Min-Patient completed 75 plus % of tasks  FIM - Toileting Toileting steps completed by patient: Adjust clothing prior to toileting, Performs perineal hygiene, Adjust clothing after toileting Toileting Assistive Devices: Grab bar or rail for support Toileting: 5: Supervision: Safety issues/verbal cues  FIM -  Radio producer Devices: Grab bars, Insurance account manager Transfers: 5-To toilet/BSC:  Supervision (verbal cues/safety issues)  FIM - Control and instrumentation engineer Devices: Environmental consultant, Orthosis, Arm rests (hand orthosis) Bed/Chair Transfer: 4: Chair or W/C > Bed: Min A (steadying Pt. > 75%)  FIM - Locomotion: Wheelchair Distance: 50 Locomotion: Wheelchair: 0: Activity did not occur FIM - Locomotion: Ambulation Locomotion: Ambulation Assistive Devices: Administrator, Orthosis (hand orthosis) Ambulation/Gait Assistance: 5: Supervision Locomotion: Ambulation: 5: Travels 150 ft or more with supervision/safety issues  Comprehension Comprehension Mode: Auditory Comprehension: 6-Follows complex conversation/direction: With extra time/assistive device  Expression Expression Mode: Verbal Expression: 6-Expresses complex ideas: With extra time/assistive device  Social Interaction Social Interaction: 6-Interacts appropriately with others with medication or extra time (anti-anxiety, antidepressant).  Problem Solving Problem Solving: 6-Solves complex problems: With extra time  Memory Memory: 7-Complete Independence: No helper Medical Problem List and Plan: 1. 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 DVT Prophylaxis/Anticoagulation: Pharmaceutical: Lovenox 3. Pain Management: oxycodone, tylenol -baclofen for spasms  -add ms contin 15mg  q12  -voltaren gel for knees  -gabapentin for neuropathic pain---increase to 200mg  tid 4. Mood: LCSW to follow for revaluation and support.  5. Neuropsych: This patient is capable of making decisions on her own behalf. 6. Skin/Wound Care: Monitor wound daily. Routine pressure relief measures.  7. Fluids/Electrolytes/Nutrition: Monitor I/O.  8. Ileus: Scheduled Reglan ac/hs and monitor for diet tolerance. Scheduled suppository  -KUB with improvement  -diet upgraded to regular/CM  -SSE if no bm 9. QVZ:DGLO monitor every 8 hours. Resume metoprolol to help with  BP as well as heart rate control.  10. DM type 2: Was on lantus 50 units with novolog tid and metformin at home. Will monitor BS with achs checks. Resumed lantus 10 units at bedtime ---increased to 20 units with some improvement---titrate further as needed 11. Hyponatremia: Multifactorial due to diet, N/V. Will recheck today     LOS (Days) 3 A FACE TO FACE EVALUATION WAS PERFORMED  SWARTZ,ZACHARY T 12/27/2014 8:25 AM

## 2014-12-27 NOTE — Progress Notes (Signed)
Pt c/o dark urine. Per OT, urine is malodorous. Pam Love notiified. Will continue to monitor.

## 2014-12-28 ENCOUNTER — Inpatient Hospital Stay (HOSPITAL_COMMUNITY): Payer: Medicaid Other

## 2014-12-28 ENCOUNTER — Inpatient Hospital Stay (HOSPITAL_COMMUNITY): Payer: Medicaid Other | Admitting: Physical Therapy

## 2014-12-28 ENCOUNTER — Inpatient Hospital Stay (HOSPITAL_COMMUNITY): Payer: Self-pay

## 2014-12-28 ENCOUNTER — Inpatient Hospital Stay (HOSPITAL_COMMUNITY): Payer: Self-pay | Admitting: Rehabilitation

## 2014-12-28 DIAGNOSIS — D571 Sickle-cell disease without crisis: Secondary | ICD-10-CM

## 2014-12-28 LAB — GLUCOSE, CAPILLARY
Glucose-Capillary: 224 mg/dL — ABNORMAL HIGH (ref 70–99)
Glucose-Capillary: 157 mg/dL — ABNORMAL HIGH (ref 70–99)
Glucose-Capillary: 50 mg/dL — ABNORMAL LOW (ref 70–99)
Glucose-Capillary: 73 mg/dL (ref 70–99)
Glucose-Capillary: 132 mg/dL — ABNORMAL HIGH (ref 70–99)
Glucose-Capillary: 170 mg/dL — ABNORMAL HIGH (ref 70–99)

## 2014-12-28 MED ORDER — INSULIN GLARGINE 100 UNIT/ML ~~LOC~~ SOLN
30.0000 [IU] | Freq: Every day | SUBCUTANEOUS | Status: DC
  Administered 2014-12-28 – 2014-12-30 (×2): 30 [IU] via SUBCUTANEOUS
  Filled 2014-12-28 (×4): qty 0.3

## 2014-12-28 NOTE — Progress Notes (Signed)
Physical Therapy Session Note  Patient Details  Name: Cynthia Rosales MRN: [id number] Date of Birth: 1980-12-09  Today's Date: 12/28/2014 PT Individual Time: 0830-0900 Treatment Session 2: 8099-8338 PT Individual Time Calculation (min): 30 min  Treatment Session 2: 30 min  Skilled Therapeutic Interventions/Progress Updates:    Gait Training: PT instructs pt in ambulation up/down 15 stairs with B rails req CGA in step-to pattern - no LOB noted.  PT instructs pt in ambulation with bari RW x 175' req SBA for safety and verbal cues for increasing gait speed, depressing shoulders, and reducing excessive lateral trunk sway.   Neuromuscular Reeducation: PT instructs pt in side stepping R/L with HHA x 55' each direction. req min A for balance and verbal cues to control excessive lateral trunk lean - pt is able to correct, but fatigues quickly and reverts to leans.   Treatment Session 2: Therapeutic Activity: Pt reports she needs to urinate and so PT provides SBA for safety for pt to complete a toilet transfer with grab bars and RW on standard height toilet. Pt self manages pants with stand by assist, but needs help getting the toilet paper out of the dispenser.  PT instructs pt in toilet to bed transfer req SBA with RW and provides verbal cues for R hand placement to grab R pants leg and assist R leg into the bed - pt completes sit to supine transfer with SBA progressing to mod I x 3 reps. Pt demonstrates supine to sit transfer in bed via long sit mod I x 3 reps.  Pt left supine in bed with all needs in reach, massaging her own belly to attempt to stimulate a BM.   Pt's AM mobility is continuing to improve and pt is tolerating increased balance activity demands, but pt has been limited with PM activity the past few days due to medical problems: constipation, nausea, vomiting, and low blood sugar. Pt's main limitations at this time are standing dynamic balance, R LE weakness, activity tolerance, and  stairs. Continue per PT POC.         Therapy Documentation Precautions:  Precautions Precautions: Fall, Cervical Required Braces or Orthoses: Cervical Brace Cervical Brace: Soft collar, For comfort Restrictions Weight Bearing Restrictions: No Pain: Pain Assessment Pain Assessment: 0-10 Pain Score: 9  Pain Type: Surgical pain Pain Location: Neck Pain Orientation: Posterior Pain Descriptors / Indicators: Burning Pain Onset: On-going Pain Intervention(s): Rest Multiple Pain Sites: Yes 2nd Pain Site Pain Score: 9 Pain Type: Neuropathic pain Pain Location: Leg Pain Orientation: Left;Right Pain Descriptors / Indicators: Burning Pain Onset: On-going Pain Intervention(s): Rest  Treatment Session 2: Pt c/o 10/10 pain in abdomen from constipation  - PT provides rest and emotional support to decrease pt's pain.    See FIM for current functional status  Therapy/Group: Individual Therapy  Scott Fix M 12/28/2014, 8:42 AM

## 2014-12-28 NOTE — Progress Notes (Signed)
Hypoglycemic Event  CBG: 50  Treatment: 15 GM carbohydrate snack  Symptoms: Sweaty and Nervous/irritable  Follow-up CBG: Time:1435 CBG Result:73  Possible Reasons for Event: sweaty, nervous, shaky; inadequate meal intake  Comments/MD notified:notified; pt CBG 157 due 14 units of insulin and then unable to eat due to nausea. 10units of meal coverage already had been administered    Cynthia Rosales B  Remember to initiate Hypoglycemia Order Set & complete

## 2014-12-28 NOTE — Progress Notes (Signed)
[facility] PHYSICAL MEDICINE & REHABILITATION     PROGRESS NOTE    Subjective/Complaints: Feels better after moving bowels yesterday. Some nausea with oxycodone? Having a good morning---pain better controlled  Objective: Vital Signs: Blood pressure 144/96, pulse 103, temperature 98.3 F (36.8 C), temperature source Oral, resp. rate 18, height 5\' 4"  (1.626 m), weight 152.545 kg (336 lb 4.8 oz), last menstrual period 12/16/2014, SpO2 100 %. No results found.  Recent Labs  12/25/14 1030  WBC 11.0*  HGB 9.1*  HCT 28.5*  PLT 349    Recent Labs  12/25/14 1030  NA 138  K 3.8  CL 103  GLUCOSE 184*  BUN <5*  CREATININE 0.64  CALCIUM 8.3*   CBG (last 3)   Recent Labs  12/27/14 1659 12/27/14 2156 12/28/14 0657  GLUCAP 103* 121* 224*    Wt Readings from Last 3 Encounters:  12/26/14 152.545 kg (336 lb 4.8 oz)  12/05/14 145.151 kg (320 lb)  11/19/14 155.674 kg (343 lb 3.2 oz)    Physical Exam:  Constitutional: She is oriented to person, place, and time. She appears well-developed and well-nourished.  Morbidly obese female. No distress HENT: oral mucosa moist, mild white coating on tongue Head: Normocephalic and atraumatic.  Eyes: Conjunctivae are normal. Pupils are equal, round, and reactive to light.  Neck: Normal range of motion. Neck supple.  Cardiovascular: Normal rate and regular rhythm.  Respiratory: Effort normal and breath sounds normal. No respiratory distress. She has no wheezes.  GI: Soft. She exhibits no distension. Bowel sounds are scarce. There is no tenderness.  Musculoskeletal: She exhibits no edema or tenderness.  Neurological: She is alert and oriented to person, place, and time.  Speech clear. Follows basic commands without difficulty.cognitively intact.  Right upper ext 4/5 delt, tricep, bicep, hand. LUE: 4+ to 5/5 prox to distal. RLE: 1+ to 2-/5 HF and KE and 1/5 ankle. LLE 3 hf, ke and 4/5 ankle. Sensation 1/2 RUE and RLE---senses pinch.  Has 1+ to 2/2 trunk and left upper and lower. DTR's absent  Skin: Skin is warm and dry. Surgical site clean with steristrips. Psych: pt is pleasant and cooperative.   Assessment/Plan: 1. 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 Physiatrist is providing close team supervision and 24 hour management of active medical problems listed below. Physiatrist and rehab team continue to assess barriers to discharge/monitor patient progress toward functional and medical goals. FIM: FIM - Bathing Bathing Steps Patient Completed: Chest, Right Arm, Left Arm, Abdomen, Front perineal area, Buttocks, Right upper leg, Left upper leg, Right lower leg (including foot), Left lower leg (including foot) Bathing: 5: Supervision: Safety issues/verbal cues  FIM - Upper Body Dressing/Undressing Upper body dressing/undressing steps patient completed: Thread/unthread right sleeve of pullover shirt/dresss, Thread/unthread left sleeve of pullover shirt/dress, Put head through opening of pull over shirt/dress, Pull shirt over trunk Upper body dressing/undressing: 5: Set-up assist to: Obtain clothing/put away FIM - Lower Body Dressing/Undressing Lower body dressing/undressing steps patient completed: Thread/unthread right pants leg, Thread/unthread left pants leg, Pull pants up/down, Fasten/unfasten pants, Don/Doff right sock, Don/Doff left sock Lower body dressing/undressing: 4: Min-Patient completed 75 plus % of tasks  FIM - Toileting Toileting steps completed by patient: Adjust clothing prior to toileting, Performs perineal hygiene, Adjust clothing after toileting Toileting Assistive Devices: Grab bar or rail for support Toileting: 5: Supervision: Safety issues/verbal cues  FIM - Toilet  Transfers Toilet Transfers Assistive Devices: Grab bars, Insurance account manager  Transfers: 5-To toilet/BSC: Supervision (verbal cues/safety issues), 5-From toilet/BSC: Supervision (verbal cues/safety issues)  FIM - Control and instrumentation engineer Devices: Environmental consultant, Arm rests Bed/Chair Transfer: 5: Bed > Chair or W/C: Supervision (verbal cues/safety issues)  FIM - Locomotion: Wheelchair Distance: 50 Locomotion: Wheelchair: 0: Activity did not occur FIM - Locomotion: Ambulation Locomotion: Ambulation Assistive Devices: Environmental consultant - Rolling, Orthosis (R HO) Ambulation/Gait Assistance: 5: Supervision Locomotion: Ambulation: 5: Travels 150 ft or more with supervision/safety issues  Comprehension Comprehension Mode: Auditory Comprehension: 7-Follows complex conversation/direction: With no assist  Expression Expression Mode: Verbal Expression: 7-Expresses complex ideas: With no assist  Social Interaction Social Interaction: 6-Interacts appropriately with others with medication or extra time (anti-anxiety, antidepressant).  Problem Solving Problem Solving: 6-Solves complex problems: With extra time  Memory Memory: 7-Complete Independence: No helper Medical Problem List and Plan: 1. 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 DVT Prophylaxis/Anticoagulation: Pharmaceutical: Lovenox 3. Pain Management: oxycodone, tylenol -baclofen for spasms  -added ms contin 15mg  q12  -voltaren gel for knees  -gabapentin for neuropathic pain---increased to 200mg  tid 4. Mood: LCSW to follow for revaluation and support.  5. Neuropsych: This patient is capable of making decisions on her own behalf. 6. Skin/Wound Care: Monitor wound daily. Routine pressure relief measures.  7. Fluids/Electrolytes/Nutrition: Monitor I/O.  8. Ileus: Scheduled Reglan ac/hs and monitor for diet tolerance. Scheduled suppository  -KUB with improvement  -diet upgraded to regular/CM  -Positive BM yesterday---pt understands that she  needs to stay "ahead" of things 9. URK:YHCW monitor every 8 hours. Resume metoprolol to help with BP as well as heart rate control.  10. DM type 2: Was on lantus 50 units with novolog tid and metformin at home. Will monitor BS with achs checks. Resumed lantus 10 units at bedtime ---increase to 30 u qhs today 11. Hyponatremia: Multifactorial--improving     LOS (Days) 4 A FACE TO FACE EVALUATION WAS PERFORMED  Cynthia Rosales T 12/28/2014 9:11 AM

## 2014-12-28 NOTE — Progress Notes (Signed)
Occupational Therapy Session Note  Patient Details  Name: Cynthia Rosales MRN: [id number] Date of Birth: 1981-02-25  Today's Date: 12/28/2014 OT Individual Time: 2202-5427 OT Individual Time Calculation (min): 60 min    Short Term Goals: Week 1:  OT Short Term Goal 1 (Week 1): Pt will perform toileting with supervision OT Short Term Goal 2 (Week 1): Pt will don LB clothing with supervison with AE prn OT Short Term Goal 3 (Week 1): Pt will perform bed mobility with supervision without bed rails and from a flat bed OT Short Term Goal 4 (Week 1): Pt will perform kitchen task with superivision sit to stand   Skilled Therapeutic Interventions/Progress Updates: ADL-retraining with focus on tub transfer, pt ed on DME/AE, and increased endurance.   Pt received seated in w/c awaiting therapist while finishing her breakfast.   Pt completed self-feeding and was receptive for planned task in tub room versus walk-in shower.   With OT providing escort to tub room, pt observed demo of transfer to bench and was educated on methods of access to bathroom with small doorway while using RW.   Pt then completed functional mobility to tub bench, performed transfer into tub with extra time d/t RLE weakness, and bathed with only setup assist to provide supplies.   Pt reported her method of bathing at home and was receptive to recommended mods for safe and thorough bathing.   Per pt she bathes up to 3 times a day for optimal hygiene.   Pt was re-educated on use of LH sponge for bathing and reacher during session with carry over of skills noted during BADL.     Therapy Documentation Precautions:  Precautions Precautions: Fall, Cervical Required Braces or Orthoses: Cervical Brace Cervical Brace: Soft collar, For comfort Restrictions Weight Bearing Restrictions: No  Vital Signs: Therapy Vitals Temp: 98.3 F (36.8 C) Temp Source: Oral Pulse Rate: (!) 103 Resp: 18 BP: (!) 144/96 mmHg Patient Position (if  appropriate): Lying Oxygen Therapy SpO2: 100 % O2 Device: Not Delivered   Pain: Pain Assessment Pain Assessment: 0-10 Pain Score: 9  Pain Type: Surgical pain Pain Location: Neck Pain Orientation: Posterior Pain Descriptors / Indicators: Burning Pain Onset: On-going Pain Intervention(s): Rest Multiple Pain Sites: Yes 2nd Pain Site Pain Score: 9 Pain Type: Neuropathic pain Pain Location: Leg Pain Orientation: Left;Right Pain Descriptors / Indicators: Burning Pain Onset: On-going Pain Intervention(s): Rest  ADL: ADL ADL Comments: see FIM  See FIM for current functional status  Therapy/Group: Individual Therapy   Second session: Time:1300-1300 Time Calculation (min):  0 min  Pain Assessment: nausea and vomiting  Skilled Therapeutic Interventions: Per RN report, pt requested cancellation of planned therapy session d/t increased symptoms of nausea and vomiting.   OT confirmed with pt who was received in bed, HOB elevated and holding emesis basin.   See FIM for current functional status  Therapy/Group: Individual Therapy  [place] 12/28/2014, 9:10 AM

## 2014-12-28 NOTE — Plan of Care (Signed)
Problem: SCI BOWEL ELIMINATION Goal: RH STG SCI MANAGE BOWEL WITH MEDICATION WITH ASSISTANCE STG SCI Manage bowel with medication with Mod I assistance.  Outcome: Not Progressing Required fleets enema

## 2014-12-28 NOTE — Progress Notes (Signed)
Physical Therapy Session Note  Patient Details  Name: Cynthia Rosales MRN: [id number] Date of Birth: February 22, 1981  Today's Date: 12/28/2014 PT Individual Time: 1100-1200 PT Individual Time Calculation (min): 60 min   Short Term Goals: Week 1:  PT Short Term Goal 1 (Week 1): Pt will ambulate 150' w/ SBA w/ no w/c follow (Pt will ambulate 150' w/ SBA w/ no w/c follow) PT Short Term Goal 2 (Week 1): Pt will complete all bed mobility w/ SBA from flat bed w/ no rails PT Short Term Goal 3 (Week 1): Pt will improve 8 points on Berg Balance Test PT Short Term Goal 4 (Week 1): Pt will ambulate 1 flight of stairs w/ MinA and 1 rail  Skilled Therapeutic Interventions/Progress Updates:   Pt received sitting in recliner in room, agreeable to therapy session.  Pt requesting to use restroom prior to leaving room.  Pt ambulated to/from restroom with bari RW at S level with min cues for safety.  Note that she is c/o increased pain in neck, low back and LEs, however had just received pain medication and was not due again until 1pm.  Pt also needing to have phone conversation with teenagers principle, which she was able to do while standing and performing washing/drying hands.  Pt then ambulated to/from therapy gym x 170' x 2 reps with bari RW at S level.  Pt discussing home issues and wanting to return home as soon as possible.  Discussed current barriers and what she will need to be able to do to get home safely.  Skilled session focused on standing tolerance, hip strengthening, stair negotiation without handrails and bed transfers/mobility to home simulated height of bed.  Performed 4 way hip exercises (hip flex, ext, add, abd) with yellow theraband on LLE, however unable to tolerate resistance on the R due to increased weakness.  Provided min HHA for support during tasks and performed 10 reps each LE and each direction.  Then performed sit<>stand x 7 reps from lowered mat to increase glute/quad strength without UE  support.  Pt able to perform at S level with cues for increased glute activation.  Then elevated height of mat to simulated home height.  Pt performed at S level with min cues and demonstration cues for safety as height of bed is high and she will need to scoot back prior to lying down.  Ended session with stair negotiation x 2, 6" steps without rails to begin to simulate home entry.  Pt able to perform 2 with RW kept on ground and then simulated how to place RW up on step in order to perform last two steps.  Performed at mod A level with +2 for safety and to stabilize RW.  Pt ambulated back to room and returned to bed.  Pt requesting to have blood sugar taken due to feeling sweating and shaky.  Nurse tech notified and in room to assess.  Pt left in bed with all needs in reach.   Therapy Documentation Precautions:  Precautions Precautions: Fall, Cervical Required Braces or Orthoses: Cervical Brace Cervical Brace: Soft collar, For comfort Restrictions Weight Bearing Restrictions: No General:   Vital Signs: Therapy Vitals Temp: 97.9 F (36.6 C) Temp Source: Oral Pulse Rate: 100 Resp: 18 BP: (!) 159/91 mmHg Patient Position (if appropriate): Sitting Oxygen Therapy SpO2: 99 % O2 Device: Not Delivered Pain: Pain Assessment Pain Assessment: 0-10 Pain Score: 10-Worst pain ever Pain Type: Acute pain Pain Location: Abdomen Pain Descriptors / Indicators: Pressure Pain Onset:  Gradual Pain Intervention(s): Rest;Emotional support Multiple Pain Sites: No   Locomotion : Ambulation Ambulation/Gait Assistance: 5: Supervision   See FIM for current functional status  Therapy/Group: Individual Therapy  Denice Bors 12/28/2014, 4:37 PM

## 2014-12-29 ENCOUNTER — Inpatient Hospital Stay (HOSPITAL_COMMUNITY): Payer: Self-pay

## 2014-12-29 LAB — GLUCOSE, CAPILLARY
Glucose-Capillary: 177 mg/dL — ABNORMAL HIGH (ref 70–99)
Glucose-Capillary: 93 mg/dL (ref 70–99)
Glucose-Capillary: 109 mg/dL — ABNORMAL HIGH (ref 70–99)
Glucose-Capillary: 162 mg/dL — ABNORMAL HIGH (ref 70–99)

## 2014-12-29 LAB — URINE CULTURE: Colony Count: >=100000

## 2014-12-29 MED ORDER — GABAPENTIN 300 MG PO CAPS
300.0000 mg | ORAL_CAPSULE | Freq: Three times a day (TID) | ORAL | Status: DC
  Administered 2014-12-29 – 2014-12-30 (×3): 300 mg via ORAL
  Filled 2014-12-29 (×7): qty 1

## 2014-12-29 NOTE — Progress Notes (Signed)
Paged and talked to on call MD concerning patient's complaint of right foot tingling and feeling of "pins and needles".  Positive Homan's sign. No redness noted to RLE. Bilateral LE swelling continues.  Patient states "it almost feels like a sickle cell flare up, but normally it is in both legs." Will continue to monitor.

## 2014-12-29 NOTE — Progress Notes (Signed)
Cynthia Rosales is a 34 y.o. female 09-08-1981 [id number]  Subjective:  Continued spasms of shooting pain RLE to back. Different than "pins and needles" pain and ache in knee pain. ?remove steri strips neck due to itching? No shortness of breath or chest pain  Objective: Vital signs in last 24 hours: Temp:  [97.9 F (36.6 C)-98.5 F (36.9 C)] 98.5 F (36.9 C) (01/30 0523) Pulse Rate:  [95-108] 95 (01/30 0523) Resp:  [18-20] 20 (01/30 0523) BP: (125-159)/(66-91) 129/78 mmHg (01/30 0523) SpO2:  [98 %-100 %] 98 % (01/30 0523) Weight change:  Last BM Date: 12/28/14  Intake/Output from previous day: 01/29 0701 - 01/30 0700 In: 240 [P.O.:240] Out: 200 [Emesis/NG output:200]  Physical Exam General: Obese. No apparent distress   In bed, pleasant Lungs: Normal effort. Lungs clear to auscultation, no crackles or wheezes. Cardiovascular: Regular rate and rhythm, no edema Neurological: No new neurological deficits Wounds: posterior neck incision is clean, dry, intact. No signs of infection. Steri strips peeling  Lab Results: BMET    Component Value Date/Time   NA 138 12/25/2014 1030   NA 140 11/19/2014 0933   K 3.8 12/25/2014 1030   CL 103 12/25/2014 1030   CO2 28 12/25/2014 1030   GLUCOSE 184* 12/25/2014 1030   GLUCOSE 233* 11/19/2014 0933   BUN <5* 12/25/2014 1030   BUN 10 11/19/2014 0933   CREATININE 0.64 12/25/2014 1030   CALCIUM 8.3* 12/25/2014 1030   GFRNONAA >90 12/25/2014 1030   GFRAA >90 12/25/2014 1030   CBC    Component Value Date/Time   WBC 11.0* 12/25/2014 1030   RBC 4.08 12/25/2014 1030   RBC 4.81 03/27/2014 1926   HGB 9.1* 12/25/2014 1030   HCT 28.5* 12/25/2014 1030   PLT 349 12/25/2014 1030   MCV 69.9* 12/25/2014 1030   MCH 22.3* 12/25/2014 1030   MCHC 31.9 12/25/2014 1030   RDW 17.5* 12/25/2014 1030   LYMPHSABS 3.6 12/25/2014 1030   MONOABS 0.7 12/25/2014 1030   EOSABS 0.1 12/25/2014 1030   BASOSABS 0.0 12/25/2014 1030   CBG's (last 3):   Recent  Labs  12/28/14 1629 12/28/14 2113 12/29/14 0647  GLUCAP 132* 170* 177*   LFT's Lab Results  Component Value Date   ALT 25 12/21/2014   AST 22 12/21/2014   ALKPHOS 73 12/21/2014   BILITOT 0.5 12/21/2014    Studies/Results: No results found.  Medications:  I have reviewed the patient's current medications. Scheduled Medications: . baclofen  5 mg Oral QID  . diclofenac sodium  2 g Topical TID  . enoxaparin (LOVENOX) injection  75 mg Subcutaneous Daily  . famotidine  20 mg Oral BID  . gabapentin  200 mg Oral TID  . insulin aspart  0-20 Units Subcutaneous TID WC  . insulin aspart  10 Units Subcutaneous TID WC  . insulin glargine  30 Units Subcutaneous QHS  . metoCLOPramide  5 mg Oral TID AC & HS  . metoprolol tartrate  12.5 mg Oral BID  . morphine  15 mg Oral Q12H  . 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 Functional deficits secondary to C4-5  tetraplegia due to Ependydomas/p decompression 2. DVT Prophylaxis/Anticoagulation: Pharmaceutical: Lovenox 3. Pain Management: oxycodone, tylenol -baclofen for spasms - ms contin 15mg  q12 -voltaren gel for knees -gabapentin for neuropathic pain---increase to 300mg  tid and continue titration as needed 4. Mood: LCSW to follow for revaluation and support.  5. Neuropsych: This patient is capable of making decisions on her own behalf. 6. Skin/Wound Care: Monitor wound daily. Routine pressure relief measures.  7. Fluids/Electrolytes/Nutrition: Monitor I/O.  8. Ileus: Scheduled Reglan ac/hs and  monitor for diet tolerance.  -diet upgraded to regular/CM -continue bowel regimen 9. HTN: monitor every 8 hours. metoprolol to help with BP as well as heart rate control.  10. DM type 2: PTA on lantus 50 units with novolog tid and metformin. Will monitor BS with achs checks. Resumed lantus 20 units with improvement---titrate further as needed 11. Hyponatremia: Multifactorial due to diet, N/V.    Length of stay, days: 5   Cartez Mogle A. Asa Lente, MD 12/29/2014, 9:22 AM
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
## 2014-12-29 NOTE — Progress Notes (Signed)
Physical Therapy Note  Patient Details  Name: Cynthia Rosales MRN: [id number] Date of Birth: 1981-07-23 Today's Date: 12/29/2014    Time: 1100-1155 55 minutes  1:1 No c/o pain. Pt c/o stomach upset but willing to participate.  Gait with RW with supervision 2 x 200' with decreased cadence, no LOB. Stair training 2 x 5 stairs with B handrail with supervision, 2 x 3 stairs with RW, no handrails with assist to hold RW. Pt feels comfortable using RW on stairs to enter home.  Nustep for strengthening LEs x 7 minutes level 4.  Sit to stand multiple attempts without UEs for LE strength and endurance. Pt able to perform with supervision.  Pt able to negotiate RW safely around her room to gather items and use bathroom with supervision.   Merlean Pizzini 12/29/2014, 11:57 AM
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
## 2014-12-30 ENCOUNTER — Inpatient Hospital Stay (HOSPITAL_COMMUNITY): Payer: Self-pay

## 2014-12-30 ENCOUNTER — Encounter (HOSPITAL_COMMUNITY): Payer: Self-pay

## 2014-12-30 LAB — GLUCOSE, CAPILLARY
Glucose-Capillary: 200 mg/dL — ABNORMAL HIGH (ref 70–99)
Glucose-Capillary: 111 mg/dL — ABNORMAL HIGH (ref 70–99)
Glucose-Capillary: 209 mg/dL — ABNORMAL HIGH (ref 70–99)
Glucose-Capillary: 141 mg/dL — ABNORMAL HIGH (ref 70–99)

## 2014-12-30 MED ORDER — PREGABALIN 50 MG PO CAPS
50.0000 mg | ORAL_CAPSULE | Freq: Two times a day (BID) | ORAL | Status: DC
  Administered 2014-12-30 – 2014-12-31 (×3): 50 mg via ORAL
  Filled 2014-12-30 (×4): qty 1
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
## 2014-12-30 NOTE — Progress Notes (Signed)
Chaplain responded to charge RN request to accompany patient  to visit son who is patient in [id number].   After RN helped pt. Into wheelchair, chaplain wheeled her to son's room.  She stayed almost two hours, until she requested to return to her room for pain meds and sleep.  Patient expressed gratitude at being able to visit her son.  They have been talking by phone...but in person visit was appreciated.  Thanks to Agricultural consultant for working this out.  Rev. [place], [place]

## 2014-12-30 NOTE — Progress Notes (Signed)
Physical Therapy Session Note  Patient Details  Name: Cynthia Rosales MRN: [id number] Date of Birth: [birth year]  Today's Date: 12/30/2014 PT Individual Time: 1300-1400 PT Individual Time Calculation (min): 60 min   Short Term Goals: Week 1:  PT Short Term Goal 1 (Week 1): Pt will ambulate 150' w/ SBA w/ no w/c follow (Pt will ambulate 150' w/ SBA w/ no w/c follow) PT Short Term Goal 2 (Week 1): Pt will complete all bed mobility w/ SBA from flat bed w/ no rails PT Short Term Goal 3 (Week 1): Pt will improve 8 points on Berg Balance Test PT Short Term Goal 4 (Week 1): Pt will ambulate 1 flight of stairs w/ MinA and 1 rail  Skilled Therapeutic Interventions/Progress Updates:    Pt received seated in recliner, agreeable to participate in therapy. Session focused on gait device training, BLE strengthening, stair negotiation. Pt ambulated to/from rehab gym w/ bari RW and overall SBA. Pt complains of increased pain in R thumb and wrist with ambulating with RW. Trialed pt with use of bariatric Rollator instead, pt greatly prefers Rollator and reports it significantly decreased pain in R hand. Pt managed brakes independently and denied feeling unbalanced or instability with use of Rollator. Pt educated on features of Rollator and its purpose (not designed for unweighting LEs during gait. Pt demonstrated appropriate use of Rollator, noted no LOB with ambulation 150' then 200' and overall SBA. Instructed pt in Nustep LE only 10' on L1 for BLE strength and generalized endurance. Instructed pt in stair negotiation 3x5 stairs with final bout simulating pt's interior stairs (rail on R going up). Pt requires overall SBA for ascending/descending stairs w/ 2 rails and Min HHA w/ 1 rail. Recommend that pt use HHA from boyfriend in hand opposite rail when ascending/descending interior stairs. Pt agreed with recommendation. Session ended in pt's room, where pt was left seated in recliner w/ all needs within  reach.    Therapy Documentation Precautions:  Precautions Precautions: Fall, Cervical Required Braces or Orthoses: Cervical Brace Cervical Brace: Soft collar, For comfort Restrictions Weight Bearing Restrictions: No Pain: Pain Assessment Pain Score: 10-Worst pain ever  Pain in R wrist/thumb. Pt switched to different mobility device for increased comfort  See FIM for current functional status  Therapy/Group: Individual Therapy  Rada Hay  Rada Hay, PT, DPT 12/30/2014, 7:29 AM

## 2014-12-30 NOTE — Progress Notes (Signed)
Occupational Therapy Note  Patient Details  Name: Sarayu Prevost MRN: [id number] Date of Birth: 02-08-1981  Today's Date: 12/30/2014 OT Individual Time: 1100-1200 OT Individual Time Calculation (min): 60 min   Pt c/o 10/10 pain in RLE; RN aware and repositioned Individual Therapy  Pt amb with RW from room to ADL apartment with supervision.  Pt requires extra time to complete walk.  Pt engaged in functional amb with RW in ADL apartment to complete simple home mgmt tasks.  Pt used reacher to gather items from floor and place them in drawer after folding.  Pt amb with RW into bathroom to use toilet.  Pt issued a RW bag to use at home.  Pt returned to room and sat in recliner with all needs within reach.  Pt required multiple rest breaks throughout session.  Focus on activity tolerance, sit<>stand, standing balance, functional amb with RW, and safety awareness.  Pt completed all tasks during session with supervision.    Leotis Shames [facility] 12/30/2014, 12:02 PM

## 2014-12-30 NOTE — Progress Notes (Signed)
Occupational Therapy Session Note  Patient Details  Name: Cynthia Rosales MRN: [id number] Date of Birth: 1981/07/01  Today's Date: 12/30/2014 OT Individual Time: 0800-0900 OT Individual Time Calculation (min): 60 min    Short Term Goals: Week 1:  OT Short Term Goal 1 (Week 1): Pt will perform toileting with supervision OT Short Term Goal 2 (Week 1): Pt will don LB clothing with supervison with AE prn OT Short Term Goal 3 (Week 1): Pt will perform bed mobility with supervision without bed rails and from a flat bed OT Short Term Goal 4 (Week 1): Pt will perform kitchen task with superivision sit to stand   Skilled Therapeutic Interventions/Progress Updates:    Pt engaged in BADL retraining including bating at walk-in shower level and dressing with sit<>stand from chair.  Pt amb with RW to gather clothing before walking into bathroom for shower.  Pt sat on seat for approx 75% of shower and stood to bathe buttocks and move seat.  Pt stood at sink to complete grooming tasks.  Pt c/o increased pain in BLE throughout session but continued to participate.  Focus on activity tolerance, sit<>stand, standing balance, functional amb with RW for home mgmt tasks, and safety awareness.  Pt completed all tasks at supervision level.  Therapy Documentation Precautions:  Precautions Precautions: Fall, Cervical Required Braces or Orthoses: Cervical Brace Cervical Brace: Soft collar, For comfort Restrictions Weight Bearing Restrictions: No Pain: Pt c/o 10/10 pain in BLE; RN admins medications during session; repositioned  ADL: ADL ADL Comments: see FIM  See FIM for current functional status  Therapy/Group: Individual Therapy  Leroy Libman 12/30/2014, 9:01 AM

## 2014-12-31 ENCOUNTER — Inpatient Hospital Stay (HOSPITAL_COMMUNITY): Payer: Medicaid Other

## 2014-12-31 LAB — BASIC METABOLIC PANEL
Anion gap: 6 (ref 5–15)
BUN: 6 mg/dL (ref 6–23)
CO2: 30 mmol/L (ref 19–32)
Calcium: 8.8 mg/dL (ref 8.4–10.5)
Chloride: 101 mmol/L (ref 96–112)
Creatinine, Ser: 0.73 mg/dL (ref 0.50–1.10)
GFR calc Af Amer: >90 mL/min (ref >90)
GFR calc non Af Amer: >90 mL/min (ref >90)
Glucose, Bld: 181 mg/dL — ABNORMAL HIGH (ref 70–99)
Potassium: 4.1 mmol/L (ref 3.5–5.1)
Sodium: 137 mmol/L (ref 135–145)

## 2014-12-31 LAB — CBC
HCT: 29.5 % — ABNORMAL LOW (ref 36.0–46.0)
Hemoglobin: 9.4 g/dL — ABNORMAL LOW (ref 12.0–15.0)
MCH: 22.4 pg — ABNORMAL LOW (ref 26.0–34.0)
MCHC: 31.9 g/dL (ref 30.0–36.0)
MCV: 70.4 fL — ABNORMAL LOW (ref 78.0–100.0)
Platelets: 273 10*3/uL (ref 150–400)
RBC: 4.19 MIL/uL (ref 3.87–5.11)
RDW: 17.8 % — ABNORMAL HIGH (ref 11.5–15.5)
WBC: 6.9 10*3/uL (ref 4.0–10.5)

## 2014-12-31 LAB — GLUCOSE, CAPILLARY
Glucose-Capillary: 156 mg/dL — ABNORMAL HIGH (ref 70–99)
Glucose-Capillary: 187 mg/dL — ABNORMAL HIGH (ref 70–99)
Glucose-Capillary: 70 mg/dL (ref 70–99)
Glucose-Capillary: 80 mg/dL (ref 70–99)
Glucose-Capillary: 152 mg/dL — ABNORMAL HIGH (ref 70–99)

## 2014-12-31 MED ORDER — INSULIN GLARGINE 100 UNIT/ML ~~LOC~~ SOLN
40.0000 [IU] | Freq: Every day | SUBCUTANEOUS | Status: DC
  Administered 2014-12-31 – 2015-01-01 (×2): 40 [IU] via SUBCUTANEOUS
  Filled 2014-12-31 (×3): qty 0.4

## 2014-12-31 MED ORDER — SENNOSIDES-DOCUSATE SODIUM 8.6-50 MG PO TABS
3.0000 | ORAL_TABLET | Freq: Every day | ORAL | Status: DC
  Administered 2014-12-31: 3 via ORAL
  Filled 2014-12-31: qty 3

## 2014-12-31 NOTE — Progress Notes (Signed)
Occupational Therapy Session Note  Patient Details  Name: Cynthia Rosales MRN: [id number] Date of Birth: 01-16-1981  Today's Date: 12/31/2014 OT Individual Time: 1100-1200 OT Individual Time Calculation (min): 60 min    Short Term Goals: Week 1:  OT Short Term Goal 1 (Week 1): Pt will perform toileting with supervision OT Short Term Goal 2 (Week 1): Pt will don LB clothing with supervison with AE prn OT Short Term Goal 3 (Week 1): Pt will perform bed mobility with supervision without bed rails and from a flat bed OT Short Term Goal 4 (Week 1): Pt will perform kitchen task with superivision sit to stand   Skilled Therapeutic Interventions/Progress Updates: ADL-retraining (50 min)  with focus on improved competence with use of AE (reacher, LH sponge, sock aid), functional mobility, and dynamic standing balance.  Therapeutic exercises (10 min) with focus on right hand grip and pinch strengthening using medium grade theraputty.   Pt received seated in recliner and receptive for B & D.   After review of discharge goals, pt performed bathing and dressing using 4-wheeled walker to gather and hold her clothes, BSC as tub bench, and AE to perform tasks unassisted with only standby assist and supervision for safety.   Pt requires setup to apply TEDs and reports confidence in her ability to manage self-care at discharge, now advanced to 01/02/15 d/t excellent progress.   After completing self-care, pt was instructed on hand grip and pinch strengthening with literature provided and 1:1 demonstration of 5 exercises with focus on right thumb opposition, extension and flexion.   Pt able to teach back 3 exercises during this session but affirmed use of graphic demonstration worksheet to enhance performance of HEP.   Pt left in w/c at end of session with all needs within reach.     Therapy Documentation Precautions:  Precautions Precautions: Fall, Cervical Required Braces or Orthoses: Cervical Brace Cervical Brace:  Soft collar, For comfort Restrictions Weight Bearing Restrictions: No   Pain: Pain Assessment Pain Score: 10-Worst pain ever Pain Location: Neck Pain Orientation: Posterior Pain Descriptors / Indicators: Stabbing;Spasm Pain Frequency: Constant Pain Onset: On-going Pain Intervention(s): Medication (See eMAR)  ADL: ADL ADL Comments: see FIM   Exercises: Hand Exercises Digit Composite Flexion: Strengthening;Right Composite Extension: Strengthening;Right Digit Lifts: Strengthening;Right Thumb Abduction: Strengthening;Right Thumb Adduction: Strengthening;Right Opposition: Strengthening;Right  See FIM for current functional status  Therapy/Group: Individual Therapy  [place] 12/31/2014, 12:39 PM

## 2014-12-31 NOTE — Plan of Care (Signed)
Problem: RH PAIN MANAGEMENT Goal: RH STG PAIN MANAGED AT OR BELOW PT'S PAIN GOAL 5 or less Outcome: Not Progressing Pain remains 10/10

## 2014-12-31 NOTE — Progress Notes (Signed)
Physical Therapy Session Note  Patient Details  Name: Cynthia Rosales MRN: [id number] Date of Birth: 18-Feb-1981  Today's Date: 12/31/2014 PT Individual Time: 1300-1400 PT Individual Time Calculation (min): 60 min   Short Term Goals: Week 1:  PT Short Term Goal 1 (Week 1): Pt will ambulate 150' w/ SBA w/ no w/c follow (Pt will ambulate 150' w/ SBA w/ no w/c follow) PT Short Term Goal 2 (Week 1): Pt will complete all bed mobility w/ SBA from flat bed w/ no rails PT Short Term Goal 3 (Week 1): Pt will improve 8 points on Berg Balance Test PT Short Term Goal 4 (Week 1): Pt will ambulate 1 flight of stairs w/ MinA and 1 rail  Skilled Therapeutic Interventions/Progress Updates:  1:1.  Pt received sitting in recliner, ready for therapy. Pt reporting 10/10 pain in R LE, but that she received pain medicine prior to start of session. Focus this session on functional endurance, functional ambulation, strength/balance exercises, functional balance and B LE therex. Pt req overall close(S) for multiple t/f sit<>stand from various surfaces as well as ambulation 175'x2 and 150'x2 with use of rollator, only req intermittent min cues for brake management. Pt demonstrating very slow gait with mild antalgic pattern due to R LE pain.   Pt performed fall prevention exercises to target balance and B LE strength, exercises included 2x10 reps of: hip abd, hamstring curls, mini squats and calf raises. Pt practiced picking up horseshoes around therapy apartment with use of reacher to simulate cleaning up home environment to challenge endurance and balance, req overall supervision with excellent management of brakes when using rollator for UE support during reaching.   Pt utilized NuStep to target B LE strength and endurance, overall good tolerance to level 1x21minutes.   Pt req intermittent short, seated rest breaks throughout session due to R LE pain and fatigue. Pt left supine in bed at end of session with all needs in  reach.   Therapy Documentation Precautions:  Precautions Precautions: Fall, Cervical Required Braces or Orthoses: Cervical Brace Cervical Brace: Soft collar, For comfort Restrictions Weight Bearing Restrictions: No Therapy Vitals Temp: 98.4 F (36.9 C) Temp Source: Oral Pulse Rate: 95 Resp: 18 BP: 128/78 mmHg Patient Position (if appropriate): Sitting Oxygen Therapy SpO2: 97 % O2 Device: Not Delivered Pain: Pain Assessment Pain Assessment: 0-10 Pain Score: 10-Worst pain ever Pain Type: Acute pain Pain Location: Neck Pain Orientation: Right Pain Descriptors / Indicators: Aching Pain Frequency: Constant Pain Onset: On-going Pain Intervention(s): Repositioned;Distraction;Rest (Pt reports receiving pain medicine prior to start of session. )  See FIM for current functional status  Therapy/Group: Individual Therapy  Gilmore Laroche 12/31/2014, 2:37 PM

## 2014-12-31 NOTE — Progress Notes (Signed)
[place] PHYSICAL MEDICINE & REHABILITATION     PROGRESS NOTE    Subjective/Complaints: Had a good weekend. Pain is better. Moving better. Did stairs with pt. Asked if she might be able to go home early  Objective: Vital Signs: Blood pressure 131/76, pulse 97, temperature 98.2 F (36.8 C), temperature source Oral, resp. rate 20, height 5\' 4"  (1.626 m), weight 152.545 kg (336 lb 4.8 oz), last menstrual period 12/16/2014, SpO2 99 %. No results found.  Recent Labs  12/31/14 0714  WBC 6.9  HGB 9.4*  HCT 29.5*  PLT 273    Recent Labs  12/31/14 0714  NA 137  K 4.1  CL 101  GLUCOSE 181*  BUN 6  CREATININE 0.73  CALCIUM 8.8   CBG (last 3)   Recent Labs  12/30/14 1640 12/30/14 2059 12/31/14 0655  GLUCAP 209* 141* 156*    Wt Readings from Last 3 Encounters:  12/26/14 152.545 kg (336 lb 4.8 oz)  12/05/14 145.151 kg (320 lb)  11/19/14 155.674 kg (343 lb 3.2 oz)    Physical Exam:  Constitutional: She is oriented to person, place, and time. She appears well-developed and well-nourished.  Morbidly obese female. No distress HENT: oral mucosa moist, mild white coating on tongue Head: Normocephalic and atraumatic.  Eyes: Conjunctivae are normal. Pupils are equal, round, and reactive to light.  Neck: Normal range of motion. Neck supple.  Cardiovascular: Normal rate and regular rhythm.  Respiratory: Effort normal and breath sounds normal. No respiratory distress. She has no wheezes.  GI: Soft. She exhibits no distension. Bowel sounds are scarce. There is no tenderness.  Musculoskeletal: She exhibits no edema or tenderness.  Neurological: She is alert and oriented to person, place, and time.  Speech clear. Follows basic commands without difficulty.cognitively intact.  Right upper ext 4/5 delt, tricep, bicep, hand. LUE: 4+ to 5/5 prox to distal. RLE: 1+ to 2-/5 HF and KE and 1/5 ankle. LLE 3 hf, ke and 4/5 ankle. Sensation 1/2 RUE and RLE---senses pinch. Has 1+ to  2/2 trunk and left upper and lower. DTR's absent  Skin: Skin is warm and dry. Surgical site clean with steristrips. Psych: pt is pleasant and cooperative.   Assessment/Plan: 1. 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 Physiatrist is providing close team supervision and 24 hour management of active medical problems listed below. Physiatrist and rehab team continue to assess barriers to discharge/monitor patient progress toward functional and medical goals. FIM: FIM - Bathing Bathing Steps Patient Completed: Chest, Right Arm, Left Arm, Abdomen, Front perineal area, Buttocks, Right upper leg, Left upper leg, Right lower leg (including foot), Left lower leg (including foot) Bathing: 5: Supervision: Safety issues/verbal cues  FIM - Upper Body Dressing/Undressing Upper body dressing/undressing steps patient completed: Thread/unthread right sleeve of pullover shirt/dresss, Thread/unthread left sleeve of pullover shirt/dress, Put head through opening of pull over shirt/dress, Pull shirt over trunk Upper body dressing/undressing: 5: Supervision: Safety issues/verbal cues FIM - Lower Body Dressing/Undressing Lower body dressing/undressing steps patient completed: Thread/unthread right pants leg, Thread/unthread left pants leg, Pull pants up/down, Fasten/unfasten pants, Don/Doff right sock, Don/Doff left sock Lower body dressing/undressing: 4: Min-Patient completed 75 plus % of tasks  FIM - Toileting Toileting steps completed by patient: Adjust clothing prior to toileting, Performs perineal hygiene, Adjust clothing after toileting Toileting Assistive Devices: Grab bar or rail for support Toileting: 6: Assistive device: No  helper  FIM - Radio producer Devices: Insurance account manager Transfers: 5-To toilet/BSC: Supervision  (verbal cues/safety issues), 5-From toilet/BSC: Supervision (verbal cues/safety issues)  FIM - Control and instrumentation engineer Devices: Environmental consultant, Arm rests Bed/Chair Transfer: 5: Chair or W/C > Bed: Supervision (verbal cues/safety issues), 5: Bed > Chair or W/C: Supervision (verbal cues/safety issues)  FIM - Locomotion: Wheelchair Distance: 50 Locomotion: Wheelchair: 0: Activity did not occur FIM - Locomotion: Ambulation Locomotion: Ambulation Assistive Devices: Administrator, Other (comment) Agricultural consultant) Ambulation/Gait Assistance: 5: Supervision Locomotion: Ambulation: 5: Travels 150 ft or more with supervision/safety issues  Comprehension Comprehension Mode: Auditory Comprehension: 7-Follows complex conversation/direction: With no assist  Expression Expression Mode: Verbal Expression: 7-Expresses complex ideas: With no assist  Social Interaction Social Interaction: 6-Interacts appropriately with others with medication or extra time (anti-anxiety, antidepressant).  Problem Solving Problem Solving: 6-Solves complex problems: With extra time  Memory Memory: 7-Complete Independence: No helper Medical Problem List and Plan: 1. 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 DVT Prophylaxis/Anticoagulation: Pharmaceutical: Lovenox 3. Pain Management: oxycodone, tylenol -baclofen for spasms  -added ms contin 15mg  q12  -voltaren gel for knees  -gabapentin for neuropathic pain---increased to 200mg  tid 4. Mood: LCSW to follow for revaluation and support.  5. Neuropsych: This patient is capable of making decisions on her own behalf. 6. Skin/Wound Care: Monitor wound daily. Routine pressure relief measures.  7. Fluids/Electrolytes/Nutrition: Monitor I/O.  8. Ileus: Scheduled Reglan ac/hs and monitor for diet tolerance. Scheduled suppository  -moved bowels  -diet upgraded to regular/CM  -eating better 9.  RCV:ELFY monitor every 8 hours. Resume metoprolol to help with BP as well as heart rate control.  10. DM type 2: Was on lantus 50 units with novolog tid and metformin at home. Will monitor BS with achs checks. Resumed lantus 10 units at bedtime ---increased to 30 u qhs---titrate to 40 units today 11. Hyponatremia: Multifactorial--improving     LOS (Days) 7 A FACE TO FACE EVALUATION WAS PERFORMED  Nieko Clarin T 12/31/2014 8:22 AM

## 2014-12-31 NOTE — Progress Notes (Signed)
Social Work Patient ID: Cynthia Rosales, female   DOB: 07-20-1981, 34 y.o.   MRN: [id number]  Alerted by therapies that pt making very good gains and team feels she could dc earlier than planned.  With MD agreement, have changed d/c date to 2/3 and pt very happy with this plan.  Continue to follow.  Oris Staffieri, LCSW

## 2014-12-31 NOTE — Progress Notes (Signed)
Physical Therapy Session Note  Patient Details  Name: Cynthia Rosales MRN: [id number] Date of Birth: 1981/10/03  Today's Date: 12/31/2014 PT Individual Time: 0800-0900 PT Individual Time Calculation (min): 60 min   Short Term Goals: Week 1:  PT Short Term Goal 1 (Week 1): Pt will ambulate 150' w/ SBA w/ no w/c follow (Pt will ambulate 150' w/ SBA w/ no w/c follow) PT Short Term Goal 2 (Week 1): Pt will complete all bed mobility w/ SBA from flat bed w/ no rails PT Short Term Goal 3 (Week 1): Pt will improve 8 points on Berg Balance Test PT Short Term Goal 4 (Week 1): Pt will ambulate 1 flight of stairs w/ MinA and 1 rail  Skilled Therapeutic Interventions/Progress Updates:    Pt received supine in bed, agreeable to participate in therapy. Session focused on ambulation, bed mobility, initiation of HEP, and stair negotiation. Pt moved supine<>sit from flat bed w/o use of rails w/ overall mod (I) w/ extra time. Pt ambulated to/from rehab gym w/ bari RW then bari rollator w/ SBA. In rehab gym instructed pt in HEP for seated HS stretch w/ bedsheet, then SLR, standing hip abduction, hip extension, and mini-squats. Pt required rest breaks between all tasks due to decreased endurance. Pt w/ difficulty performing SLR on R due to decreased strength, noted motor activation but little clearance from mat. Instructed pt in stair training up/down one flight of stairs w/ 1 rail and HHA in hand opposite rail to simulate home environment. Pt required MinA for stair negotiation and seated rest break at top of stairs before descending. Session ended in pt's room, where pt was left seated in recliner w/ all needs within reach.    Therapy Documentation Precautions:  Precautions Precautions: Fall, Cervical Required Braces or Orthoses: Cervical Brace Cervical Brace: Soft collar, For comfort Restrictions Weight Bearing Restrictions: No Pain: Pain Assessment Pain Score: 7  Pain Location: Leg Pain Descriptors /  Indicators: Aching Pain Frequency: Constant Pain Intervention(s): Instructed pt in HS/calf stretch, pt allowed to rest  See FIM for current functional status  Therapy/Group: Individual Therapy  Rada Hay  Rada Hay, PT, DPT 12/31/2014, 7:55 AM

## 2015-01-01 ENCOUNTER — Inpatient Hospital Stay (HOSPITAL_COMMUNITY): Payer: Medicaid Other

## 2015-01-01 ENCOUNTER — Inpatient Hospital Stay (HOSPITAL_COMMUNITY): Payer: Self-pay

## 2015-01-01 ENCOUNTER — Inpatient Hospital Stay (HOSPITAL_COMMUNITY): Payer: Self-pay | Admitting: *Deleted

## 2015-01-01 LAB — URINE MICROSCOPIC-ADD ON

## 2015-01-01 LAB — URINALYSIS, ROUTINE W REFLEX MICROSCOPIC
Bilirubin Urine: NEGATIVE
Glucose, UA: NEGATIVE mg/dL
Hgb urine dipstick: NEGATIVE
Ketones, ur: NEGATIVE mg/dL
Nitrite: NEGATIVE
Protein, ur: 30 mg/dL — AB
Specific Gravity, Urine: 1.013 (ref 1.005–1.030)
Urobilinogen, UA: 1 mg/dL (ref 0.0–1.0)
pH: 7 (ref 5.0–8.0)

## 2015-01-01 LAB — GLUCOSE, CAPILLARY
Glucose-Capillary: 141 mg/dL — ABNORMAL HIGH (ref 70–99)
Glucose-Capillary: 115 mg/dL — ABNORMAL HIGH (ref 70–99)
Glucose-Capillary: 117 mg/dL — ABNORMAL HIGH (ref 70–99)
Glucose-Capillary: 159 mg/dL — ABNORMAL HIGH (ref 70–99)

## 2015-01-01 MED ORDER — SENNA 8.6 MG PO TABS
2.0000 | ORAL_TABLET | Freq: Two times a day (BID) | ORAL | Status: DC
  Administered 2015-01-01 – 2015-01-02 (×2): 17.2 mg via ORAL
  Filled 2015-01-01 (×4): qty 2

## 2015-01-01 MED ORDER — SENNOSIDES-DOCUSATE SODIUM 8.6-50 MG PO TABS
2.0000 | ORAL_TABLET | Freq: Three times a day (TID) | ORAL | Status: DC
  Administered 2015-01-01: 2 via ORAL
  Filled 2015-01-01: qty 2

## 2015-01-01 MED ORDER — FLEET ENEMA 7-19 GM/118ML RE ENEM
1.0000 | ENEMA | Freq: Once | RECTAL | Status: DC
  Filled 2015-01-01: qty 1

## 2015-01-01 MED ORDER — GABAPENTIN 300 MG PO CAPS
300.0000 mg | ORAL_CAPSULE | Freq: Three times a day (TID) | ORAL | Status: DC
  Administered 2015-01-01 – 2015-01-02 (×3): 300 mg via ORAL
  Filled 2015-01-01 (×7): qty 1

## 2015-01-01 NOTE — Progress Notes (Addendum)
Physical Therapy Discharge Summary  Patient Details  Name: Cynthia Rosales MRN: [id number] Date of Birth: 09-28-1981  Patient has met 7 of 7 long term goals due to improved activity tolerance, improved balance, increased strength and functional use of  right upper extremity and right lower extremity.  Patient to discharge at an ambulatory level Modified Independent.   Patient's care partner is independent to provide the necessary physical assistance at discharge.  Reasons goals not met: N/A  Recommendation:  Patient provided with home exercise program for standing balance and falls risk reduction.   Equipment: Bariatric Rollator  Reasons for discharge: treatment goals met and discharge from hospital  Patient/family agrees with progress made and goals achieved: Yes  PT Discharge Precautions/Restrictions Precautions Precautions: Fall;Cervical Cervical Brace: Soft collar;For comfort Restrictions Weight Bearing Restrictions: No Vital Signs Therapy Vitals Temp: 98.2 F (36.8 C) Temp Source: Oral Pulse Rate: 93 BP: 118/62 mmHg Patient Position (if appropriate): Lying Oxygen Therapy SpO2: 98 % O2 Device: Not Delivered Pain Pain Assessment Pain Score: 9  Pain Type: Acute pain Pain Location: Leg Pain Orientation: Right;Left Pain Descriptors / Indicators: Aching Pain Intervention(s): Medication (See eMAR) Vision/Perception     Cognition Overall Cognitive Status: Within Functional Limits for tasks assessed Arousal/Alertness: Awake/alert Orientation Level: Oriented X4 Sensation Sensation Light Touch: Impaired Detail Light Touch Impaired Details:  (Impaired in RLE>RUE>LLE) Proprioception Impaired Details: Impaired RLE (Impaired in RLE in testing but does not affect pt functionally) Coordination Gross Motor Movements are Fluid and Coordinated: Yes Motor  Motor Motor: Tetraplegia  Mobility Bed Mobility Bed Mobility: Supine to Sit;Sit to Supine Supine to Sit: 6: Modified  independent (Device/Increase time) Sit to Supine: 6: Modified independent (Device/Increase time) Transfers Transfers: Yes Sit to Stand: 6: Modified independent (Device/Increase time) Stand to Sit: 6: Modified independent (Device/Increase time) Locomotion  Ambulation Ambulation: Yes Ambulation/Gait Assistance: 6: Modified independent (Device/Increase time) Ambulation Distance (Feet): 150 Feet Assistive device:  (Rollator) Gait Gait: Yes Gait Pattern: Antalgic Stairs / Additional Locomotion Stairs: Yes Stairs Assistance: 4: Min assist Stair Management Technique: Two rails;One rail Right;One rail Left  Trunk/Postural Assessment  Cervical Assessment Cervical Assessment:  (cervical precautions, ROM NT) Thoracic Assessment Thoracic Assessment:  (rounded thoracic region) Lumbar Assessment Lumbar Assessment:  (lordosis) Postural Control Postural Control: Within Functional Limits  Balance Balance Balance Assessed: Yes Standardized Balance Assessment Standardized Balance Assessment: Berg Balance Test Berg Balance Test Sit to Stand: Able to stand without using hands and stabilize independently Standing Unsupported: Able to stand safely 2 minutes Sitting with Back Unsupported but Feet Supported on Floor or Stool: Able to sit safely and securely 2 minutes Stand to Sit: Controls descent by using hands Transfers: Able to transfer safely, definite need of hands Standing Unsupported with Eyes Closed: Able to stand 10 seconds safely Standing Ubsupported with Feet Together: Able to place feet together independently and stand for 1 minute with supervision From Standing, Reach Forward with Outstretched Arm: Can reach forward >12 cm safely (5") From Standing Position, Pick up Object from Floor: Able to pick up shoe, needs supervision From Standing Position, Turn to Look Behind Over each Shoulder: Turn sideways only but maintains balance (no cervical rotation d/t cervical precautions) Turn 360  Degrees: Able to turn 360 degrees safely but slowly Standing Unsupported, Alternately Place Feet on Step/Stool: Able to complete >2 steps/needs minimal assist Standing Unsupported, One Foot in Front: Able to plae foot ahead of the other independently and hold 30 seconds Standing on One Leg: Unable to try or needs assist to  prevent fall Total Score: 39 Extremity Assessment  RUE Assessment RUE Assessment: Exceptions to [facility] RUE AROM (degrees) RUE Overall AROM Comments: 0-90 degrees (higher range pain) and cervical precautions RUE Strength RUE Overall Strength: Due to precautions LUE Assessment LUE Assessment: Within Functional Limits RLE Strength RLE Overall Strength Comments: Overall 3/5 LLE Strength LLE Overall Strength Comments: Overall 4/5  See FIM for current functional status  Rada Hay 01/01/2015, 7:47 AM

## 2015-01-01 NOTE — Progress Notes (Signed)
Physical Therapy Note  Patient Details  Name: Cynthia Rosales MRN: [id number] Date of Birth: 1981/10/01 Today's Date: 01/01/2015    At scheduled therapy time pt in bed with lights off complaining of significant pain and nausea. RN aware and informed therapist that pt had been vomiting this PM. Pt declined to participate in therapy session. Pt informed that therapist would schedule family training tomorrow prior to discharge, pt agreed with plan and confirmed that boyfriend would be able to come to training at 11:00 tomorrow. Pt missed 60 minutes scheduled PT.  Rada Hay 01/01/2015, 3:14 PM

## 2015-01-01 NOTE — Progress Notes (Signed)
[facility] PHYSICAL MEDICINE & REHABILITATION     PROGRESS NOTE    Subjective/Complaints: Sore this morning in back/neck. Urine with odor  Objective: Vital Signs: Blood pressure 118/62, pulse 93, temperature 98.2 F (36.8 C), temperature source Oral, resp. rate 18, height 5\' 4"  (1.626 m), weight 152.545 kg (336 lb 4.8 oz), last menstrual period 12/16/2014, SpO2 98 %. No results found.  Recent Labs  12/31/14 0714  WBC 6.9  HGB 9.4*  HCT 29.5*  PLT 273    Recent Labs  12/31/14 0714  NA 137  K 4.1  CL 101  GLUCOSE 181*  BUN 6  CREATININE 0.73  CALCIUM 8.8   CBG (last 3)   Recent Labs  12/31/14 1623 12/31/14 2013 01/01/15 0659  GLUCAP 80 152* 141*    Wt Readings from Last 3 Encounters:  12/26/14 152.545 kg (336 lb 4.8 oz)  12/05/14 145.151 kg (320 lb)  11/19/14 155.674 kg (343 lb 3.2 oz)    Physical Exam:  Constitutional: She is oriented to person, place, and time. She appears well-developed and well-nourished.  Morbidly obese female. No distress HENT: oral mucosa moist, mild white coating on tongue Head: Normocephalic and atraumatic.  Eyes: Conjunctivae are normal. Pupils are equal, round, and reactive to light.  Neck: Normal range of motion. Neck supple.  Cardiovascular: Normal rate and regular rhythm.  Respiratory: Effort normal and breath sounds normal. No respiratory distress. She has no wheezes.  GI: Soft. She exhibits no distension. Bowel sounds are scarce. There is no tenderness.  Musculoskeletal: She exhibits no edema or tenderness.  Neurological: She is alert and oriented to person, place, and time.  Speech clear. Follows basic commands without difficulty.cognitively intact.  Right upper ext 4/5 delt, tricep, bicep, hand. LUE: 4+ to 5/5 prox to distal. RLE: 1+ to 2-/5 HF and KE and 1/5 ankle. LLE 3 hf, ke and 4/5 ankle. Sensation 1/2 RUE and RLE---senses pinch. Has 1+ to 2/2 trunk and left upper and lower. DTR's absent  Skin: Skin is  warm and dry. Surgical site clean with steristrips. Psych: pt is pleasant and cooperative.   Assessment/Plan: 1. 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 Physiatrist is providing close team supervision and 24 hour management of active medical problems listed below. Physiatrist and rehab team continue to assess barriers to discharge/monitor patient progress toward functional and medical goals. FIM: FIM - Bathing Bathing Steps Patient Completed: Chest, Right Arm, Left Arm, Abdomen, Front perineal area, Buttocks, Right upper leg, Left upper leg, Right lower leg (including foot), Left lower leg (including foot) Bathing: 5: Supervision: Safety issues/verbal cues  FIM - Upper Body Dressing/Undressing Upper body dressing/undressing steps patient completed: Thread/unthread right sleeve of pullover shirt/dresss, Thread/unthread left sleeve of pullover shirt/dress, Put head through opening of pull over shirt/dress, Pull shirt over trunk Upper body dressing/undressing: 5: Supervision: Safety issues/verbal cues FIM - Lower Body Dressing/Undressing Lower body dressing/undressing steps patient completed: Thread/unthread right pants leg, Thread/unthread left pants leg, Pull pants up/down, Fasten/unfasten pants, Don/Doff right sock, Don/Doff left sock Lower body dressing/undressing: 4: Min-Patient completed 75 plus % of tasks  FIM - Toileting Toileting steps completed by patient: Adjust clothing prior to toileting, Performs perineal hygiene, Adjust clothing after toileting Toileting Assistive Devices: Grab bar or rail for support Toileting: 6: Assistive device: No helper  FIM - Radio producer Devices: Insurance account manager Transfers: 5-To toilet/BSC:  Supervision (verbal cues/safety issues), 5-From toilet/BSC: Supervision (verbal  cues/safety issues)  FIM - Bed/Chair Transfer Bed/Chair Transfer Assistive Devices: Walker, Arm rests, Bed rails Bed/Chair Transfer: 5: Bed > Chair or W/C: Supervision (verbal cues/safety issues), 5: Chair or W/C > Bed: Supervision (verbal cues/safety issues), 6: Sit > Supine: No assist  FIM - Locomotion: Wheelchair Distance: 50 Locomotion: Wheelchair: 0: Activity did not occur FIM - Locomotion: Ambulation Locomotion: Ambulation Assistive Devices: Other (comment) (bariatric rollator) Ambulation/Gait Assistance: 5: Supervision Locomotion: Ambulation: 5: Travels 150 ft or more with supervision/safety issues  Comprehension Comprehension Mode: Auditory Comprehension: 7-Follows complex conversation/direction: With no assist  Expression Expression Mode: Verbal Expression: 7-Expresses complex ideas: With no assist  Social Interaction Social Interaction: 7-Interacts appropriately with others - No medications needed.  Problem Solving Problem Solving: 7-Solves complex problems: Recognizes & self-corrects  Memory Memory: 7-Complete Independence: No helper Medical Problem List and Plan: 1. 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 DVT Prophylaxis/Anticoagulation: Pharmaceutical: Lovenox 3. Pain Management: oxycodone, tylenol -baclofen for spasms  -added ms contin 15mg  q12  -voltaren gel for knees  -gabapentin for neuropathic pain---increase to 300mg  tid 4. Mood: LCSW to follow for revaluation and support.  5. Neuropsych: This patient is capable of making decisions on her own behalf. 6. Skin/Wound Care: Monitor wound daily. Routine pressure relief measures.  7. Fluids/Electrolytes/Nutrition: Monitor I/O.  8. Ileus: Scheduled Reglan ac/hs and monitor for diet tolerance. Scheduled suppository  -moved bowels  -diet upgraded to regular/CM  -eating better 9. RWE:RXVQ monitor every 8 hours. Resume metoprolol to help with BP  as well as heart rate control.  10. DM type 2: Was on lantus 50 units with novolog tid and metformin at home. Will monitor BS with achs checks. Resumed lantus 10 units at bedtime ---increased to   40 units qhs 11. Hyponatremia: Multifactorial--improving     LOS (Days) 8 A FACE TO FACE EVALUATION WAS PERFORMED  SWARTZ,ZACHARY T 01/01/2015 8:15 AM

## 2015-01-01 NOTE — Progress Notes (Signed)
Occupational Therapy Session Note  Patient Details  Name: Cynthia Rosales MRN: [id number] Date of Birth: 06-09-1981  Today's Date: 01/01/2015 OT Individual Time: 0729-0829 OT Individual Time Calculation (min): 60 min   Short Term Goals: Week 1:  OT Short Term Goal 1 (Week 1): Pt will perform toileting with supervision OT Short Term Goal 2 (Week 1): Pt will don LB clothing with supervison with AE prn OT Short Term Goal 3 (Week 1): Pt will perform bed mobility with supervision without bed rails and from a flat bed OT Short Term Goal 4 (Week 1): Pt will perform kitchen task with superivision sit to stand   Skilled Therapeutic Interventions/Progress Updates: ADL-retraining with focus on graduation day performance of BADL.   Pt received seated in her recliner wearing gown and reporting no clean clothing d/t poor follow-through  from caregiver Cynthia Rosales).   Pt rose from recliner unassisted and ambulated to bathroom with supervision for safety while using rollator walker.   Pt required min vc to manage walker in bathroom but completed all bathing unassisted. Pt returned to to room using rollator, groomed at sink standing supported and returned to recliner to dry and don new socks using sock aid and reacher.  OT provided assist with donning TEDs.   Pt confirmed ability to complete toilet hygiene independently and states that she does not require use of BSC or grab bars to transfer to/from standard toilet.   At end of session, pt requested review of hand and pinch strengthening HEP which was upgraded at pt's request with use of medium grade putty this date d/t improved grip strength.     Therapy Documentation Precautions:  Precautions Precautions: Fall, Cervical Required Braces or Orthoses: Cervical Brace Cervical Brace: Soft collar, For comfort Restrictions Weight Bearing Restrictions: No  Pain: Pain Assessment Pain Score: 10-Worst pain ever  ADL: ADL ADL Comments: see FIM  See FIM for current  functional status  Therapy/Group: Individual Therapy   Second session: Time: 1300-1400 Time Calculation (min):  60 min  Pain Assessment: 10/10  Skilled Therapeutic Interventions: Therapeutic activity with focus on dynamic standing balance, homemaking (simple meal prep), functional mobility using rollator walker (4-wheeled walker w/seat), and improved RUE function. Pt was received seated in recliner and in conversation with caregiver on her cell phone.   Pt reported frustration with coordination of discharge planning while managing details of daily living which was creating confusion from caregiver who had not arrived for family training.    OT advised pt to engage in session to address homemaking goal and improved dynamic standing balance using game play (Nintendo Wii) to provoke repeated sit>stand and dynamic balance while sustaining attention as simulating home environment.    Pt ambulated approx 10' and reported onset of right leg pain with request for escort to kitchen in w/c.    After escort to kitchen, pt used rollator extensively in kitchen, alternating use of rollator and countertops for support to complete meal prep (scrambled egg) and cleanup at sink.   Pt reported no pain at right leg during task and was not limited once engaged in task.  No LOB noted or weaknesses limited her performance of task although pt suggested she would likely not cook at home initially.   OT re-educated pt on energy conservation techniques to include delegation and seated food prep using rollator as intended.     From kitchen pt was escorted to gym and completed 1 game of Wii bowling, sitting and standing, with good humor expressed throughout session  and no ill effected observed.   Pt was escorted back to room and resumed phone contact with caregiver from her recliner, with all needs within reach.   See FIM for current functional status  Therapy/Group: Individual Therapy  [place] 01/01/2015, 2:07 PM

## 2015-01-01 NOTE — Patient Care Conference (Signed)
Inpatient RehabilitationTeam Conference and Plan of Care Update Date: 01/01/2015   Time: 2:05 PM    Patient Name: Cynthia Rosales      Medical Record Number: [id number]  Date of Birth: 04-25-1981 Sex: Female         Room/Bed: 4W24C/4W24C-01 Payor Info: Payor: MEDICAID [place] / Plan: MEDICAID [place] ACCESS / Product Type: *No Product type* /    Admitting Diagnosis: tetraplegia due to ependymoma  tumor resecton  Admit Date/Time:  12/24/2014  3:36 PM Admission Comments: No comment available   Primary Diagnosis:  Tetraplegia Principal Problem: Tetraplegia  Patient Active Problem List   Diagnosis Date Noted  . Ileus, postoperative 12/25/2014  . Sickle cell disease without crisis 12/25/2014  . Diabetes mellitus type 2 in obese 12/25/2014  . Ependymoma of spinal cord 12/24/2014  . Tetraplegia 12/24/2014  . C4 spinal cord injury 12/24/2014  . Ileus of unspecified type   . Atelectasis   . Bowel obstruction   . Encounter for central line care   . Encounter for nasogastric (NG) tube placement   . Ileus   . Intestinal occlusion   . 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  . Headache 11/19/2014  . Neck pain 11/19/2014  . Right sided weakness 11/19/2014  . Paresthesias 11/19/2014  . Nightmares 09/11/2014  . HTN (hypertension), benign 03/28/2014  . Nephrolithiasis 03/27/2014  . Diabetes mellitus 03/27/2014  . Abdominal pain 03/27/2014    Expected Discharge Date: Expected Discharge Date: 01/02/15  Team Members Present: Physician leading conference: Dr. Alger Simons Social Worker Present: Lennart Pall, LCSW Nurse Present: Elliot Cousin, RN PT Present: Canary Brim, Lorriane Shire, PT OT Present: Salome Spotted, OT SLP Present: Weston Anna, SLP PPS Coordinator present : Daiva Nakayama, RN, CRRN     Current Status/Progress Goal Weekly Team Focus  Medical   improved bowel movements. pain issues. acute on chronic  prepare for dc  finalize dc planning   Bowel/Bladder   Pt cont. of bowel and  bladder. LBM 1-29 with Fleets enema given.   manage b/b Mod I.   cont. use of stool softeners and maintain regular BM   Swallow/Nutrition/ Hydration             ADL's   Mod I for BADL and bathroom transfers  mod I overall  AE/DME training, dynamic stanidng balance, UE strengthening, right hand girp/pinch strengthening, d/c planning   Mobility   Overall mod (I)  mod (I)  discharge planning, HEP, endurance, stairs   Communication             Safety/Cognition/ Behavioral Observations            Pain   Pain to neck and BLE, 10/10. shooting, aching pain. heat pad inuse. Scheduled morphine 15mg  ER BID, lyrica and baclofen with PRN oxycodone 15mg  IR q4  6 or less  medicate as needed and cont. use of scheduled medications and heating pad   Skin   steri strips to posterior neck CDI  remain free of skin [place] and infection min assist  assess skin q shift and monitor site for s/s of infection    Rehab Goals Patient on target to meet rehab goals: Yes *See Care Plan and progress notes for long and short-term goals.  Barriers to Discharge: pain    Possible Resolutions to Barriers:  see above, appropriate diet, ed    Discharge Planning/Teaching Needs:  home with her boyfriend who is able to provide 24/7 assistance      Team Discussion:  Good gains  and able to change d/c date.  Pain continues to be a problem - restarting neurontin.  Ready for d/c.  Revisions to Treatment Plan:  None   Continued Need for Acute Rehabilitation Level of Care: The patient requires daily medical management by a physician with specialized training in physical medicine and rehabilitation for the following conditions: Daily direction of a multidisciplinary physical rehabilitation program to ensure safe treatment while eliciting the highest outcome that is of practical value to the patient.: Yes Daily medical management of patient stability for increased activity during participation in an intensive  rehabilitation regime.: Yes Daily analysis of laboratory values and/or radiology reports with any subsequent need for medication adjustment of medical intervention for : Neurological problems;Post surgical problems  [name] 01/01/2015, 3:25 PM

## 2015-01-01 NOTE — Progress Notes (Signed)
Pt stated she had a large soft BM 2-1 at night. Staff was unaware. Pt had small pasty, soft BM this morning. Scheduled fleets enema was not given as ordered. Pt refused enema. Notified Algis Liming, PA. Love discussed with pt importance of regular BMs and purpose of medications.  No fleets enema given at this time as ordered. Will continue to monitor.

## 2015-01-01 NOTE — Progress Notes (Signed)
Physical Therapy Session Note  Patient Details  Name: Cynthia Rosales MRN: [id number] Date of Birth: [birth year]  Today's Date: 01/01/2015 PT Individual Time: 0900-1000 PT Individual Time Calculation (min): 60 min   Short Term Goals: Week 1:  PT Short Term Goal 1 (Week 1): Pt will ambulate 150' w/ SBA w/ no w/c follow (Pt will ambulate 150' w/ SBA w/ no w/c follow) PT Short Term Goal 2 (Week 1): Pt will complete all bed mobility w/ SBA from flat bed w/ no rails PT Short Term Goal 3 (Week 1): Pt will improve 8 points on Berg Balance Test PT Short Term Goal 4 (Week 1): Pt will ambulate 1 flight of stairs w/ MinA and 1 rail  Skilled Therapeutic Interventions/Progress Updates:    Pt received seated in rollator at sink finishing oral care, agreeable to participate in therapy. Session focused on bed mobility, pain management, re-evaluation of balance. Pt initially began ambulating to rehab gym w/ Rollator but demonstrated significantly antalgic gait on R compared to yesterday, pt reported significant increase in neuropathic pain in RLE along with hypersensitivity to touch. Pt transported remaining distance to gym for energy conservation. In gym instructed pt in gentle ROM for ankle and knee to address generalized pain in R leg. Pt denied decrease in pain, but therapist noted decreased antalgia with gait after interventions. Informed medical team of pt's pain complaints, MD and PA aware. Pt moved supine<>sit on flat bed w/ no rails w/ mod (I). Instructed pt in [employer], see details in discharge note. Pt improved from 17/56 at eval to 39/56, indicating significant clinical improvement, but pt still at increased risk for falls. Pt educated on test interpretation and agreed with results. Pt ambulated 150' to room w/ Rollator and mod (I). Session ended in pt's room, where pt was left seated in recliner w/ all needs within reach.   Therapy Documentation Precautions:  Precautions Precautions: Fall,  Cervical Required Braces or Orthoses: Cervical Brace Cervical Brace: Soft collar, For comfort Restrictions Weight Bearing Restrictions: No Pain:  10/10 in RLE, medical team made aware  See FIM for current functional status  Therapy/Group: Individual Therapy  Rada Hay  Rada Hay, PT, DPT 01/01/2015, 7:45 AM

## 2015-01-02 ENCOUNTER — Ambulatory Visit (HOSPITAL_COMMUNITY): Payer: Self-pay

## 2015-01-02 LAB — GLUCOSE, CAPILLARY
Glucose-Capillary: 197 mg/dL — ABNORMAL HIGH (ref 70–99)
Glucose-Capillary: 89 mg/dL (ref 70–99)

## 2015-01-02 MED ORDER — GABAPENTIN 300 MG PO CAPS: 300.0000 mg | ORAL_CAPSULE | Freq: Three times a day (TID) | ORAL | Status: DC

## 2015-01-02 MED ORDER — METOCLOPRAMIDE HCL 5 MG PO TABS: ORAL_TABLET | ORAL | Status: DC

## 2015-01-02 MED ORDER — MORPHINE SULFATE ER 15 MG PO TBCR: 15.0000 mg | EXTENDED_RELEASE_TABLET | Freq: Two times a day (BID) | ORAL | Status: DC

## 2015-01-02 MED ORDER — OXYCODONE HCL 15 MG PO TABS: 7.5000 mg | ORAL_TABLET | Freq: Four times a day (QID) | ORAL | Status: DC | PRN

## 2015-01-02 MED ORDER — INSULIN ASPART 100 UNIT/ML ~~LOC~~ SOLN: 10.0000 [IU] | Freq: Three times a day (TID) | SUBCUTANEOUS | Status: DC

## 2015-01-02 MED ORDER — INSULIN GLARGINE 100 UNIT/ML ~~LOC~~ SOLN: 40.0000 [IU] | Freq: Every day | SUBCUTANEOUS | Status: DC

## 2015-01-02 MED ORDER — BACLOFEN 10 MG PO TABS: 5.0000 mg | ORAL_TABLET | Freq: Four times a day (QID) | ORAL | Status: DC

## 2015-01-02 MED ORDER — FAMOTIDINE 20 MG PO TABS: 20.0000 mg | ORAL_TABLET | Freq: Two times a day (BID) | ORAL | Status: DC

## 2015-01-02 MED ORDER — DICLOFENAC SODIUM 1 % TD GEL: 2.0000 g | Freq: Three times a day (TID) | TRANSDERMAL | Status: DC

## 2015-01-02 MED ORDER — SENNA 8.6 MG PO TABS: 2.0000 | ORAL_TABLET | Freq: Two times a day (BID) | ORAL | Status: DC

## 2015-01-02 MED ORDER — OXYCODONE HCL 10 MG PO TABS: 10.0000 mg | ORAL_TABLET | Freq: Four times a day (QID) | ORAL | Status: DC | PRN

## 2015-01-02 MED ORDER — BISACODYL 10 MG RE SUPP: RECTAL | Status: DC

## 2015-01-02 MED ORDER — METOPROLOL TARTRATE 25 MG PO TABS: 12.5000 mg | ORAL_TABLET | Freq: Every day | ORAL | Status: DC

## 2015-01-02 NOTE — Progress Notes (Addendum)
[place] PHYSICAL MEDICINE & REHABILITATION     PROGRESS NOTE    Subjective/Complaints: Right leg better. Moved bowels overnight and this am. No nausea.  Objective: Vital Signs: Blood pressure 144/70, pulse 92, temperature 98.5 F (36.9 C), temperature source Oral, resp. rate 18, height 5\' 4"  (1.626 m), weight 152.545 kg (336 lb 4.8 oz), last menstrual period 12/16/2014, SpO2 100 %. Dg Abd 1 View  01/01/2015   CLINICAL DATA:  Abdominal pain and vomiting for 2 days  EXAM: ABDOMEN - 1 VIEW  COMPARISON:  12/24/2014  FINDINGS: Numerous gas-filled loops of bowel are reidentified throughout the abdomen, with poor differentiation of small bowel loops from large bowel due to decreased resolution. Largest mid small bowel loop measures 3.7 cm. Presence or absence of air-fluid levels or free air is suboptimally evaluated on this supine projection. No radio-opaque calculi or other significant radiographic abnormality are seen. Exam detail is suboptimal due to patient body habitus.  IMPRESSION: Multiple gas-filled loops of bowel are reidentified throughout the abdomen with mildly dilated mid small bowel loop which could indicate small bowel obstruction although ileus could appear similar.   Electronically Signed   By: Conchita Paris M.D.   On: 01/01/2015 19:50    Recent Labs  12/31/14 0714  WBC 6.9  HGB 9.4*  HCT 29.5*  PLT 273    Recent Labs  12/31/14 0714  NA 137  K 4.1  CL 101  GLUCOSE 181*  BUN 6  CREATININE 0.73  CALCIUM 8.8   CBG (last 3)   Recent Labs  01/01/15 1633 01/01/15 2112 01/02/15 0650  GLUCAP 117* 159* 197*    Wt Readings from Last 3 Encounters:  12/26/14 152.545 kg (336 lb 4.8 oz)  12/05/14 145.151 kg (320 lb)  11/19/14 155.674 kg (343 lb 3.2 oz)    Physical Exam:  Constitutional: She is oriented to person, place, and time. She appears well-developed and well-nourished.  Morbidly obese female. No distress HENT: oral mucosa moist, mild white coating on  tongue Head: Normocephalic and atraumatic.  Eyes: Conjunctivae are normal. Pupils are equal, round, and reactive to light.  Neck: Normal range of motion. Neck supple.  Cardiovascular: Normal rate and regular rhythm.  Respiratory: Effort normal and breath sounds normal. No respiratory distress. She has no wheezes.  GI: Soft. She exhibits no distension. Bowel sounds are scarce. There is no tenderness.  Musculoskeletal: She exhibits no edema or tenderness.  Neurological: She is alert and oriented to person, place, and time.  Speech clear. Follows basic commands without difficulty.cognitively intact.  Right upper ext 4/5 delt, tricep, bicep, hand. LUE: 4+ to 5/5 prox to distal. RLE: 1+ to 2-/5 HF and KE and 1/5 ankle. LLE 3 hf, ke and 4/5 ankle. Sensation 1/2 RUE and RLE---senses pinch. Has 1+ to 2/2 trunk and left upper and lower. DTR's absent  Skin: Skin is warm and dry. Surgical site clean with steristrips. Psych: pt is pleasant and cooperative.   Assessment/Plan: 1. 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 Physiatrist is providing close team supervision and 24 hour management of active medical problems listed below. Physiatrist and rehab team continue to assess barriers to discharge/monitor patient progress toward functional and medical goals.  Dc home today with pcp,pmr, ns follow up.  hh therapies FIM: FIM - Bathing Bathing Steps Patient Completed: Chest, Right Arm, Left Arm, Abdomen, Front perineal area, Buttocks,  Right upper leg, Left upper leg, Right lower leg (including foot), Left lower leg (including foot) Bathing: 6: Assistive device (Comment) (using LH sponge)  FIM - Upper Body Dressing/Undressing Upper body dressing/undressing steps patient completed: Thread/unthread right sleeve of pullover shirt/dresss,  Thread/unthread left sleeve of pullover shirt/dress, Put head through opening of pull over shirt/dress, Pull shirt over trunk Upper body dressing/undressing: 6: More than reasonable amount of time FIM - Lower Body Dressing/Undressing Lower body dressing/undressing steps patient completed: Thread/unthread right underwear leg, Thread/unthread left underwear leg, Pull underwear up/down, Don/Doff right sock, Don/Doff left sock Lower body dressing/undressing: 6: More than reasonable amount of time (unable to wear shoes available d/t foot edema)  FIM - Toileting Toileting steps completed by patient: Adjust clothing prior to toileting, Performs perineal hygiene, Adjust clothing after toileting Toileting Assistive Devices: Grab bar or rail for support Toileting: 6: More than reasonable amount of time  FIM - Radio producer Devices: Insurance account manager Transfers: 6-To toilet/ BSC, 6-From toilet/BSC  FIM - Control and instrumentation engineer Devices: Copy: 6: More than reasonable amt of time, 6: Supine > Sit: No assist, 6: Bed > Chair or W/C: No assist, 6: Chair or W/C > Bed: No assist  FIM - Locomotion: Wheelchair Distance: 50 Locomotion: Wheelchair: 0: Activity did not occur FIM - Locomotion: Ambulation Locomotion: Ambulation Assistive Devices: Other (comment) (bariatric rollator) Ambulation/Gait Assistance: 6: Modified independent (Device/Increase time) Locomotion: Ambulation: 5: Travels 150 ft or more with supervision/safety issues  Comprehension Comprehension Mode: Auditory Comprehension: 7-Follows complex conversation/direction: With no assist  Expression Expression Mode: Verbal Expression: 7-Expresses complex ideas: With no assist  Social Interaction Social Interaction: 7-Interacts appropriately with others - No medications needed.  Problem Solving Problem Solving: 6-Solves complex problems: With extra time  Memory Memory:  7-Complete Independence: No helper Medical Problem List and Plan: 1. 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 DVT Prophylaxis/Anticoagulation: Pharmaceutical: Lovenox--can stop 3. Pain Management: oxycodone, tylenol -baclofen for spasms  -added ms contin 15mg  q12  -voltaren gel for knees  -gabapentin   300mg  tid--working better 4. Mood: LCSW to follow for revaluation and support.  5. Neuropsych: This patient is capable of making decisions on her own behalf. 6. Skin/Wound Care: Monitor wound daily. Routine pressure relief measures.  7. Fluids/Electrolytes/Nutrition: Monitor I/O.  8. Ileus: Scheduled Reglan ac/hs and monitor for diet tolerance. Discussed the importance of a bowel program and qod bm  -moved bowels again.   -xray yesterday showed ?ileus---belly soft with bowel sounds today  -eating better 9. OAC:ZYSA monitor every 8 hours. Resume metoprolol to help with BP as well as heart rate control.  10. DM type 2: Was on lantus 50 units with novolog tid and metformin at home. Will monitor BS with achs checks. Resumed lantus 10 units at bedtime ---increased to   40 units qhs 11. Hyponatremia: Multifactorial--improving   12. UA---+/-, culture pending----no treat at this time   LOS (Days) 9 A FACE TO FACE EVALUATION WAS PERFORMED  Cordell Guercio T 01/02/2015 9:30 AM

## 2015-01-02 NOTE — Progress Notes (Signed)
01-02-15 Patient had a enema this am with no results, patient stated that she had 3 paste small bowel movement before the enema this am. Patient also having some leaking of stool with coughing. Patient complaining of abdominal pain, also with  burping patient complains of foul smell and taste.

## 2015-01-02 NOTE — Progress Notes (Signed)
Physical Therapy Session Note  Patient Details  Name: Cynthia Rosales MRN: [id number] Date of Birth: 1981/04/21  Today's Date: 01/02/2015 PT Individual Time: 1105-1115 PT Individual Time Calculation (min): 10 min   Short Term Goals: Week 1:  PT Short Term Goal 1 (Week 1): Pt will ambulate 150' w/ SBA w/ no w/c follow (Pt will ambulate 150' w/ SBA w/ no w/c follow) PT Short Term Goal 2 (Week 1): Pt will complete all bed mobility w/ SBA from flat bed w/ no rails PT Short Term Goal 3 (Week 1): Pt will improve 8 points on Berg Balance Test PT Short Term Goal 4 (Week 1): Pt will ambulate 1 flight of stairs w/ MinA and 1 rail  Skilled Therapeutic Interventions/Progress Updates:    Pt received seated in recliner, agreeable to participate in therapy. Session focused on family training with boyfriend for stair negotiation. Pt able to direct care and instruct boyfriend in providing assist with min cueing. Pt's boyfriend provided Min HHA to pt to ascend/descend 1 flight of stairs w/ 1 rail to simulate home environment. Pt's boyfriend educated on providing solid base for pt to push up on w/ her hand and positioning himself downhill of patient on stairs. Both pt and boyfriend verbalized and demonstrated understanding and denied any questions or concerns. Pt left ambulating back to room w/ boyfriend and rollator.    Therapy Documentation Precautions:  Precautions Precautions: Fall, Cervical Required Braces or Orthoses: Cervical Brace Cervical Brace: Soft collar, For comfort Restrictions Weight Bearing Restrictions: No Pain:  Pt complained of RLE pain that was improved from yesterday  See FIM for current functional status  Therapy/Group: Individual Therapy  Rada Hay  Rada Hay, PT, DPT 01/02/2015, 7:35 AM

## 2015-01-02 NOTE — Progress Notes (Signed)
Pt discharged at 16 with boyfriend to home. Discharge instructions given by Algis Liming, PA with verbal understanding.

## 2015-01-02 NOTE — Plan of Care (Signed)
Problem: RH Floor Transfers Goal: LTG Patient will perform floor transfers w/assist (PT) LTG: Patient will perform floor transfers with assistance (PT).  Outcome: Not Met (add Reason) Unable to assess floor transfer at discharge due to significant RLE pain and nausea/vomiting

## 2015-01-02 NOTE — Discharge Summary (Signed)
Physician Discharge Summary  Patient ID: Cynthia Rosales MRN: [id number] DOB/AGE: July 22, 1981 34 y.o.  Admit date: 12/24/2014 Discharge date: 01/02/2015  Discharge Diagnoses:  Principal Problem:   Tetraplegia Active Problems:   HTN (hypertension), benign   Ependymoma of spinal cord   C4 spinal cord injury   Ileus, postoperative   Sickle cell disease without crisis   Diabetes mellitus type 2 in obese   Discharged Condition: Stable.   Significant Diagnostic Studies: Dg Abd 1 View  01/01/2015   CLINICAL DATA:  Abdominal pain and vomiting for 2 days  EXAM: ABDOMEN - 1 VIEW  COMPARISON:  12/24/2014  FINDINGS: Numerous gas-filled loops of bowel are reidentified throughout the abdomen, with poor differentiation of small bowel loops from large bowel due to decreased resolution. Largest mid small bowel loop measures 3.7 cm. Presence or absence of air-fluid levels or free air is suboptimally evaluated on this supine projection. No radio-opaque calculi or other significant radiographic abnormality are seen. Exam detail is suboptimal due to patient body habitus.  IMPRESSION: Multiple gas-filled loops of bowel are reidentified throughout the abdomen with mildly dilated mid small bowel loop which could indicate small bowel obstruction although ileus could appear similar.   Electronically Signed   By: Conchita Paris M.D.   On: 01/01/2015 19:50   Dg Abd 1 View  12/24/2014   CLINICAL DATA:  Initial encounter for vomiting for several days.  EXAM: ABDOMEN - 1 VIEW  COMPARISON:  12/21/2014  FINDINGS: Two supine views. Both moderately degraded by patient body habitus. No bowel distention to suggest obstruction. Gas within normal caliber large and small bowel. No gross free intraperitoneal air.  IMPRESSION: Moderate degradation, secondary to patient body habitus.  Improvement in bowel distention since the prior exam. No gross bowel obstruction identified.   Electronically Signed   By: Abigail Miyamoto M.D.   On:  12/24/2014 17:45   Dg Abd Portable 1v  12/21/2014   CLINICAL DATA:  Patient with ileus.  EXAM: PORTABLE ABDOMEN - 1 VIEW  COMPARISON:  12/19/2014  FINDINGS: Persistent gaseous distention of the colon. Additionally there are dilated loops of small bowel within the central abdomen measuring up to 3- 3.5 cm. Enteric tube tip and side-port project over the stomach. Visualized left lung base is unremarkable.  IMPRESSION: Persistent gaseous distention of the colon. Additionally there is increasing small bowel dilatation. This may reflect worsening ileus.   Electronically Signed   By: Lovey Newcomer M.D.   On: 12/21/2014 10:27   Dg Abd Portable 1v  12/19/2014   CLINICAL DATA:  Bowel ileus.  History of renal disorder.  EXAM: PORTABLE ABDOMEN - 1 VIEW  COMPARISON:  12/17/2014  FINDINGS: Diffusely dilated colon. Possible dilated small bowel in the left abdomen, versus dilated redundant sigmoid colon. Body habitus reduces diagnostic sensitivity and specificity.  IMPRESSION: 1. Gaseous distention of the colon. Possible dilated small bowel loops. Appearance may well reflect ileus, correlate with bowel sounds.   Electronically Signed   By: Sherryl Barters M.D.   On: 12/19/2014 17:01    Labs:  Basic Metabolic Panel:  Recent Labs Lab 12/31/14 0714  NA 137  K 4.1  CL 101  CO2 30  GLUCOSE 181*  BUN 6  CREATININE 0.73  CALCIUM 8.8    CBC:  Recent Labs Lab 12/31/14 0714  WBC 6.9  HGB 9.4*  HCT 29.5*  MCV 70.4*  PLT 273    CBG:  Recent Labs Lab 01/01/15 0659 01/01/15 1127 01/01/15 1633 01/01/15 2112 01/02/15  [place]     Brief HPI:   Cynthia Rosales is a 34 y.o. female with history of HTN, DM, morbid obesity, SSD, progressive neck pain with radiation to BUE and recent MRI with C4-C5 expansile mass. She was admitted for further work up via [place] office on 12/10/14 due reports of recent loss of B/B function with progressive quadreparesis and was started on IV  steroids. 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 She was taken to OR on 12/13/13 for C3-C5 laminectomy with resection of intramedullary tumor by Dr. Annette Stable. Pathology positive for ependymoma. Post op course complicated by abdominal pian due to paralytic ileus requiring NGT for decompression as well as hypotension requiring IVF. NGT d/c and diet advanced but patient continues to have complaints of N/. She was showing good motivation with steady progress and CIR was recommended for follow up therapy.ssion   Hospital Course: Phiona Ramnauth was admitted to rehab 12/24/2014 for inpatient therapies to consist of PT and OT at least three hours five days a week. Past admission physiatrist, therapy team and rehab RN have worked together to provide customized collaborative inpatient rehab. Marland Kitchen She was noted to have emesis X 2 on day of admission therefore diet was downgraded to full liquids with addition of Miralax and Reglan to help with ongoing ileus. She had good results with enema and GI symptoms improved within 24 hours. Her diet was advanced to regular with good tolerance.  Patient has required  extensive education on importance of bowel program as well as effects of narcotics on constipation. Po intake has been good but she did have recurrent emesis on 01/01/15 prior to discharge. KUB was done showing gaseous distension and patient was again educated on bowel program as she has been refusing po  laxatives as she reported having BMs prior to discharge.  Miralax was changed to Senna to help with tolerance.  Lantus was resumed at admission and diabetes has been monitored with ac/hs cbg checks.  Insulin was titrated to lantus 40 units at bedtime and novolog 10 units tid ac.  Further changes to regimen to be done by PMD on outpatient basis. Blood pressures were monitored on tid basis and metoprolol was resumed to help with BP control as well as  tachycardia.    She continued to have complaints of poor pain control therefore MS contin was added to augment pain management.  Gabapentin was changed to lyrica by MD on call but patient had worsening of neuropathic symptoms with severe dysesthesias RLE. Therefore lyrica was d/c and gabapentin was resumed with great improvement in symptoms. Baclofen was added to help with spasticity and Voltaren gel was added to help with bilateral knee pain. BLE dopplers were done at admission due to edema and were negative for DVT. Lovenox has been used for DVT prophylaxis during her stay. TEDs as well as elevation of BLE was encouraged to help with edema control. Neck incision has been healing well without signs or symptoms of infection.  Mood has been stable and patient has made steady progress to modified independent level.   She has been educated on HEP as well as importance of continued mobility for progression past discharge.     Rehab course: During patient's stay in rehab weekly team conferences were held to monitor patient's progress, set goals and discuss barriers to discharge. At admission, patient required min assist with mobility and ADL tasks.  Patient has had improvement in activity tolerance, balance, postural control, as well as ability to compensate for deficits. She has had improvement in functional use BLE and BUE as well as improvement in strength and decrease in pain. She is modified independent for ADL tasks as well as functional mobility. She is able to ambulate 150 feet at modified independent level with rollater. Family education was done with boyfriend regarding need for min assist with stair navigation.    Disposition: Home.   Diet: Diabetic diet.   Special Instructions: 1. Check blood sugars before meals and at bedtime. Contact your primary care for input if BS start running over 150. 2. Need to have BM every day. If no BM in 48 hours--use suppository or enema. Continue bowel program as  prescribed.  3. Limit oxycodone use to help with constipation.  4. Call MD if you develop abdominal distension or pain with nausea/vomiting.  5. Do not use Flexeril.       Medication List    STOP taking these medications        losartan 25 MG tablet  Commonly known as:  COZAAR     metFORMIN 1000 MG tablet  Commonly known as:  GLUCOPHAGE     topiramate 25 MG tablet  Commonly known as:  TOPAMAX      TAKE these medications        baclofen 10 MG tablet  Commonly known as:  LIORESAL  Take 0.5 tablets (5 mg total) by mouth 4 (four) times daily. For muscle spasms     bisacodyl 10 MG suppository  Commonly known as:  DULCOLAX  Use a suppository every other day if you have not had a BM in 48 hours     diclofenac sodium 1 % Gel  Commonly known as:  VOLTAREN  Apply 2 g topically 3 (three) times daily. To bilateral knees     famotidine 20 MG tablet  Commonly known as:  PEPCID  Take 1 tablet (20 mg total) by mouth 2 (two) times daily.     gabapentin 300 MG capsule  Commonly known as:  NEURONTIN  Take 1 capsule (300 mg total) by mouth 3 (three) times daily.     hydrOXYzine 25 MG capsule  Commonly known as:  VISTARIL  Take 25 mg by mouth at bedtime.     insulin aspart 100 UNIT/ML injection  Commonly known as:  novoLOG  Inject 10 Units into the skin 3 (three) times daily.     insulin glargine 100 UNIT/ML injection  Commonly known as:  LANTUS  Inject 0.4 mLs (40 Units total) into the skin at bedtime.     metoCLOPramide 5 MG tablet  Commonly known as:  REGLAN  Take one tablet before meals and at bedtime for a week. Then decrease to one tablet before meals for a week from 2/10-2/16. Then decrease to one tablet twice a day before meals 2/17-2/24. Then take one daily till gone.     metoprolol tartrate 25 MG tablet  Commonly known as:  LOPRESSOR  Take 0.5 tablets (12.5 mg total) by mouth daily.     morphine 15 MG 12 hr tablet--Rx #60 pills   Commonly known as:  MS CONTIN   Take 1 tablet (15 mg total) by mouth every 12 (twelve) hours.     oxyCODONE 15 MG immediate release tablet--Rx #120 pills  Commonly known as:  ROXICODONE  Take 0.5-1 tablets (7.5-15 mg total) by mouth every 6 (six) hours as needed for pain.  senna 8.6 MG Tabs tablet  Commonly known as:  SENOKOT  Take 2 tablets (17.2 mg total) by mouth 2 (two) times daily. For constipation           Follow-up Information    Follow up with Meredith Staggers, MD On 02/01/2015.   Specialty:  Physical Medicine and Rehabilitation   Why:  Be there at  10:20  for  10:40 am  appointment    Contact information:   [address], [address] [place] [address] [phone]       Follow up with Charlie Pitter, MD. Call today.   Specialty:  Neurosurgery   Why:  for follow up appointment   Contact information:   [address] [place] [address] [phone]       Follow up with [facility] On 01/17/2015.   Why:  @ 10:45 am   Contact information:   [address] [place] [address] [phone]       Signed: Bary Leriche 01/02/2015, 8:37 AM

## 2015-01-02 NOTE — Progress Notes (Signed)
Occupational Therapy Discharge Summary  Patient Details  Name: Galena Logie MRN: [id number] Date of Birth: [birth year]   Patient has met 9 of 9 long term goals due to improved activity tolerance, improved balance, postural control, ability to compensate for deficits, functional use of  RIGHT upper extremity, improved attention and improved awareness.  Patient to discharge at overall Modified Independent level.  Patient's care partner is independent to provide the necessary cognitive assistance at discharge to supervise need for energy conservation during prolonged recovery, assume role in childcare and verbally cue pt to maintain cervical precautions during BADL/iADL.    Reasons goals not met: n/a  Recommendation:  Patient will not require ongoing skilled OT services in home health or outpatient setting to advance in performance of BALD or iADL.  Equipment: Tub bench  Reasons for discharge: treatment goals met  Patient/family agrees with progress made and goals achieved: Yes  OT Discharge Precautions/Restrictions  Precautions Precautions: Fall;Cervical Cervical Brace: Soft collar;For comfort Restrictions Weight Bearing Restrictions: No  Vital Signs Therapy Vitals Temp: 98.5 F (36.9 C) Temp Source: Oral Pulse Rate: 92 Resp: 18 BP: (!) 144/70 mmHg Patient Position (if appropriate): Lying Oxygen Therapy SpO2: 100 % O2 Device: Not Delivered  Pain Pain Assessment Pain Score: 10-Worst pain ever Pain Type: Acute pain Pain Location: Leg Pain Orientation: Right Pain Descriptors / Indicators: Aching;Burning Pain Onset: On-going Patients Stated Pain Goal: 3 Pain Intervention(s): Medication (See eMAR);Distraction Multiple Pain Sites: No  ADL ADL ADL Comments: see FIM  Vision/Perception  Vision- History Baseline Vision/History: No visual deficits Patient Visual Report: No change from baseline Perception Comments: WFL   Cognition Overall Cognitive Status: Within  Functional Limits for tasks assessed Arousal/Alertness: Awake/alert Orientation Level: Oriented X4 Attention: Divided Divided Attention: Appears intact Memory: Appears intact Awareness: Appears intact Problem Solving: Appears intact Safety/Judgment: Appears intact  Sensation Sensation Light Touch: Impaired Detail Light Touch Impaired Details: Impaired RLE;Impaired LLE Stereognosis: Appears Intact Hot/Cold: Appears Intact Proprioception: Appears Intact Proprioception Impaired Details: Impaired RLE Additional Comments: WFL @ BUE  Motor  Motor Motor: Tetraplegia Motor - Discharge Observations: BLE weakness; no observed deficits limiting performance of BADL at BUE  Mobility  Bed Mobility Bed Mobility: Supine to Sit;Sit to Supine Supine to Sit: 6: Modified independent (Device/Increase time) Sit to Supine: 6: Modified independent (Device/Increase time) Transfers Transfers: Sit to Stand;Stand to Sit Sit to Stand: 6: Modified independent (Device/Increase time) Stand to Sit: 6: Modified independent (Device/Increase time)   Trunk/Postural Assessment  Cervical Assessment Cervical Assessment: Exceptions to [facility] Cervical AROM Overall Cervical AROM: Due to precautions Thoracic Assessment Thoracic Assessment: Within Functional Limits Lumbar Assessment Lumbar Assessment: Within Functional Limits Postural Control Postural Control: Within Functional Limits   Balance Balance Balance Assessed: Yes Standardized Balance Assessment Standardized Balance Assessment: Berg Balance Test Berg Balance Test Sit to Stand: Able to stand without using hands and stabilize independently Standing Unsupported: Able to stand safely 2 minutes Sitting with Back Unsupported but Feet Supported on Floor or Stool: Able to sit safely and securely 2 minutes Stand to Sit: Controls descent by using hands Transfers: Able to transfer safely, definite need of hands Standing Unsupported with Eyes Closed: Able to  stand 10 seconds safely Standing Ubsupported with Feet Together: Able to place feet together independently and stand for 1 minute with supervision From Standing, Reach Forward with Outstretched Arm: Can reach forward >12 cm safely (5") From Standing Position, Pick up Object from Floor: Able to pick up shoe, needs supervision From Standing Position, Turn to Look  Behind Over each Shoulder: Turn sideways only but maintains balance Turn 360 Degrees: Able to turn 360 degrees safely but slowly Standing Unsupported, Alternately Place Feet on Step/Stool: Able to complete >2 steps/needs minimal assist Standing Unsupported, One Foot in Front: Able to plae foot ahead of the other independently and hold 30 seconds Standing on One Leg: Unable to try or needs assist to prevent fall Total Score: 39 Dynamic Sitting Balance Sitting balance - Comments: WFL during performance of BADL and iADL Static Standing Balance Static Standing - Balance Support: During functional activity Static Standing - Level of Assistance: 6: Modified independent (Device/Increase time) Dynamic Standing Balance Dynamic Standing - Balance Support: During functional activity Dynamic Standing - Level of Assistance: 6: Modified independent (Device/Increase time) Dynamic Standing - Balance Activities: Forward lean/weight shifting;Reaching for weighted objects;Lateral lean/weight shifting;Reaching across midline;Wii Dynamic Standing - Comments: as observed with use of rollator walker for support during homemaking and recreational activities  Extremity/Trunk Assessment RUE Assessment RUE Assessment: Exceptions to [facility] RUE AROM (degrees) RUE Overall AROM Comments: 0-90 degrees (higher range pain) and cervical precautions RUE Strength RUE Overall Strength: Due to precautions LUE Assessment LUE Assessment: Within Functional Limits  See FIM for current functional status  [place] 01/02/2015, 5:11 AM

## 2015-01-02 NOTE — Discharge Instructions (Signed)
Inpatient Rehab Discharge Instructions  Cynthia Rosales Discharge date and time:  01/01/15  Activities/Precautions/ Functional Status: Activity: activity as tolerated Diet: diabetic diet Note decrease in insulin needs.  Wound Care: keep wound clean and dry Functional status:  ___ No restrictions     ___ Walk up steps independently ___ 24/7 supervision/assistance   ___ Walk up steps with assistance _X__ Intermittent supervision/assistance  _X__ Bathe/dress independently _X__ Walk with walker    ___ Bathe/dress with assistance ___ Walk Independently    ___ Shower independently ___ Walk with assistance    ___ Shower with assistance _X__ No alcohol     ___ Return to work/school ________     COMMUNITY REFERRALS UPON DISCHARGE:    Medical Equipment/Items Ordered:  Walker, tub bench                                                       Agency/Supplier:  [employer] @ [phone]   TELEPHONE INTERVIEW WITH [employer] ON 01/08/15 @ 12:45 PM     Special Instructions: 1. Check blood sugars before meals and at bedtime. Contact your primary care for input if BS start running over 150. 2. Need to have BM every day. If no BM in 48 hours--use suppository or enema. Continue bowel program as prescribed.  3. Limit oxycodone use to help with constipation.  4. Call MD if you develop abdominal distension or pain with nausea/vomiting.    My questions have been answered and I understand these instructions. I will adhere to these goals and the provided educational materials after my discharge from the hospital.  Patient/Caregiver Signature _______________________________ Date __________  Clinician Signature _______________________________________ Date __________  Please bring this form and your medication list with you to all your follow-up doctor's appointments.

## 2015-01-02 NOTE — Progress Notes (Signed)
Social Work  Discharge Note  The overall goal for the admission was met for:   Discharge location: Yes - home with boyfriend who can provide 24/7 assistance  Length of Stay: Yes - 9 days  Discharge activity level: Yes - modified independent  Home/community participation: Yes  Services provided included: MD, RD, PT, OT, RN, TR, Pharmacy and [place]: Medicaid  Follow-up services arranged: DME: bariatric rollator, tub bench via [place], Other: Pt referred to Social Security for Disability application and Patient/Family has no preference for HH/DME agencies  Comments (or additional information):  Patient/Family verbalized understanding of follow-up arrangements: Yes  Individual responsible for coordination of the follow-up plan: patient  Confirmed correct DME delivered: Karmen Altamirano 01/02/2015    Reon Hunley

## 2015-01-03 ENCOUNTER — Telehealth: Payer: Self-pay | Admitting: Physical Medicine and Rehabilitation

## 2015-01-03 ENCOUNTER — Encounter (HOSPITAL_COMMUNITY): Payer: Self-pay | Admitting: Physical Medicine and Rehabilitation

## 2015-01-03 LAB — URINE CULTURE: Colony Count: >=100000

## 2015-01-03 MED ORDER — CIPROFLOXACIN HCL 250 MG PO TABS: 250.0000 mg | ORAL_TABLET | Freq: Two times a day (BID) | ORAL | Status: DC

## 2015-01-03 NOTE — Telephone Encounter (Signed)
Called and informed patient that UCS was positive for UTI and will call Rx to [facility].  Rx for cipro 250 mg. Take one pill bid for 5 days

## 2015-02-01 ENCOUNTER — Encounter: Payer: Medicaid Other | Attending: Physical Medicine & Rehabilitation | Admitting: Physical Medicine & Rehabilitation

## 2015-02-08 ENCOUNTER — Inpatient Hospital Stay: Payer: Self-pay | Admitting: Physical Medicine & Rehabilitation

## 2015-02-12 ENCOUNTER — Other Ambulatory Visit: Payer: Self-pay | Admitting: Physical Medicine and Rehabilitation

## 2015-04-11 ENCOUNTER — Other Ambulatory Visit: Payer: Self-pay | Admitting: Neurosurgery

## 2015-04-11 DIAGNOSIS — C719 Malignant neoplasm of brain, unspecified: Secondary | ICD-10-CM

## 2015-04-15 ENCOUNTER — Encounter (HOSPITAL_COMMUNITY): Payer: Self-pay | Admitting: Emergency Medicine

## 2015-04-15 ENCOUNTER — Emergency Department (HOSPITAL_COMMUNITY): Payer: Medicaid Other

## 2015-04-15 ENCOUNTER — Emergency Department (HOSPITAL_COMMUNITY)
Admission: EM | Admit: 2015-04-15 | Discharge: 2015-04-15 | Disposition: A | Discharge status: Home / Self Care | Payer: Medicaid Other | Attending: Emergency Medicine | Admitting: Emergency Medicine

## 2015-04-15 DIAGNOSIS — J45909 Unspecified asthma, uncomplicated: Secondary | ICD-10-CM | POA: Insufficient documentation

## 2015-04-15 DIAGNOSIS — M542 Cervicalgia: Secondary | ICD-10-CM | POA: Diagnosis not present

## 2015-04-15 DIAGNOSIS — Z794 Long term (current) use of insulin: Secondary | ICD-10-CM | POA: Diagnosis not present

## 2015-04-15 DIAGNOSIS — Z87442 Personal history of urinary calculi: Secondary | ICD-10-CM | POA: Diagnosis not present

## 2015-04-15 DIAGNOSIS — M545 Low back pain, unspecified: Secondary | ICD-10-CM

## 2015-04-15 DIAGNOSIS — E119 Type 2 diabetes mellitus without complications: Secondary | ICD-10-CM | POA: Diagnosis not present

## 2015-04-15 DIAGNOSIS — F329 Major depressive disorder, single episode, unspecified: Secondary | ICD-10-CM | POA: Insufficient documentation

## 2015-04-15 DIAGNOSIS — Z8742 Personal history of other diseases of the female genital tract: Secondary | ICD-10-CM | POA: Diagnosis not present

## 2015-04-15 DIAGNOSIS — I10 Essential (primary) hypertension: Secondary | ICD-10-CM | POA: Diagnosis not present

## 2015-04-15 DIAGNOSIS — Z79899 Other long term (current) drug therapy: Secondary | ICD-10-CM | POA: Diagnosis not present

## 2015-04-15 DIAGNOSIS — Z880 Allergy status to penicillin: Secondary | ICD-10-CM | POA: Insufficient documentation

## 2015-04-15 DIAGNOSIS — Z862 Personal history of diseases of the blood and blood-forming organs and certain disorders involving the immune mechanism: Secondary | ICD-10-CM | POA: Diagnosis not present

## 2015-04-15 LAB — I-STAT CHEM 8, ED
BUN: 12 mg/dL (ref 6–20)
Calcium, Ion: 1.19 mmol/L (ref 1.12–1.23)
Chloride: 100 mmol/L — ABNORMAL LOW (ref 101–111)
Creatinine, Ser: 0.7 mg/dL (ref 0.44–1.00)
Glucose, Bld: 64 mg/dL — ABNORMAL LOW (ref 65–99)
HCT: 37 % (ref 36.0–46.0)
Hemoglobin: 12.6 g/dL (ref 12.0–15.0)
Potassium: 3.5 mmol/L (ref 3.5–5.1)
Sodium: 139 mmol/L (ref 135–145)
TCO2: 24 mmol/L (ref 0–100)

## 2015-04-15 MED ORDER — HYDROMORPHONE HCL 1 MG/ML IJ SOLN
1.0000 mg | Freq: Once | INTRAMUSCULAR | Status: AC
  Administered 2015-04-15: 1 mg via INTRAVENOUS
  Filled 2015-04-15: qty 1

## 2015-04-15 MED ORDER — HYDROMORPHONE HCL 1 MG/ML IJ SOLN
0.5000 mg | Freq: Once | INTRAMUSCULAR | Status: AC
  Administered 2015-04-15: 0.5 mg via INTRAMUSCULAR

## 2015-04-15 MED ORDER — HYDROMORPHONE HCL 1 MG/ML IJ SOLN
0.5000 mg | Freq: Once | INTRAMUSCULAR | Status: DC
  Filled 2015-04-15: qty 1

## 2015-04-15 MED ORDER — GADOBENATE DIMEGLUMINE 529 MG/ML IV SOLN
20.0000 mL | Freq: Once | INTRAVENOUS | Status: AC | PRN
  Administered 2015-04-15: 20 mL via INTRAVENOUS

## 2015-04-15 MED ORDER — ONDANSETRON 4 MG PO TBDP
8.0000 mg | ORAL_TABLET | Freq: Once | ORAL | Status: AC
  Administered 2015-04-15: 8 mg via ORAL
  Filled 2015-04-15: qty 2

## 2015-04-15 NOTE — ED Notes (Signed)
Pt st's pain in back still remains a #10 on pain scale after pain meds given

## 2015-04-15 NOTE — ED Notes (Signed)
Family at bedside; pt had episode of vomiting; antiemetic given.

## 2015-04-15 NOTE — ED Notes (Signed)
Pt returned from MRI °

## 2015-04-15 NOTE — Discharge Instructions (Signed)

## 2015-04-15 NOTE — ED Notes (Signed)
Pt able to stand up and ambulate without assistance next to bed.

## 2015-04-15 NOTE — ED Notes (Signed)
PT had back surgery to "remove a tumor" in January and was left with lower leg paralysis and incontinence. Pt was able to stand and shift over to bed. Pt states pain is in neck and back and legs and is burning. Takes percocet 15 for pain. Has taken gabapentin for diabetes neuropathy and states did not help. Does not see pain management. PCP [facility] px pain meds.

## 2015-04-15 NOTE — ED Provider Notes (Signed)
CSN: [id number]     Arrival date & time 04/15/15  6761 History   First MD Initiated Contact with Patient 04/15/15 1000     Chief Complaint  Patient presents with  . Back Pain  . Neck Pain     (Consider location/radiation/quality/duration/timing/severity/associated sxs/prior Treatment) Patient is a 34 y.o. female presenting with back pain and neck pain. The history is provided by the patient. No language interpreter was used.  Back Pain Associated symptoms: no fever and no headaches   Neck Pain Associated symptoms: no fever and no headaches   Ms. Checo is a 34 y.o female with a history of HTN, DM type II, sickle cell disease, anemia, and cervical spine tumor who presents with increased neck pain and lower extremity pain for the past 2 weeks.  She states she has bowel and bladder incontinence but this has been ongoing since before her spinal surgery in January 2016.  She states her neurologist, Dr. Annette Stable is aware of her residual right sided weakness since the surgery and her pain. She states she was given 15mg  percocet and morphine for pain but cannot take the morphine because it upsets her stomach. She last saw Dr. Annette Stable in April and he scheduled her for an MRI on 05/01/15. She ambulates with a walker at home.  She denies any fever or chills. She denies any IV drug use.  Past Medical History  Diagnosis Date  . Hypertension   . Asthma   . Type II diabetes mellitus   . History of blood transfusion     "after I had one of my kids"  . Sickle cell disease   . Anemia   . Depression   . Ovarian cyst   . Renal disorder     kidney stones  . Abscess of bursa, left elbow 06/2013    Treated with I and D/antibiotics.    Past Surgical History  Procedure Laterality Date  . Cesarean section  2000; 2007; 2011  . Dilation and curettage of uterus    . Appendectomy  2013  . Reduction mammaplasty Bilateral 1998  . Laminectomy N/A 12/13/2014    Procedure: CERVICAL LAMINECTOMY FOR INTRADURAL TUMOR;   Surgeon: Charlie Pitter, MD;  Location: [facility];  Service: Neurosurgery;  Laterality: N/A;  posterior   Family History  Problem Relation Age of Onset  . Breast cancer Maternal Aunt   . Ovarian cancer Maternal Grandmother   . Breast cancer Maternal Aunt   . Migraines Neg Hx   . Diabetes Mother   . Hypertension Mother    History  Substance Use Topics  . Smoking status: Never Smoker   . Smokeless tobacco: Never Used     Comment: 03/27/2014 "smoked ~ 1 cigarette/day; quit in ~ 2013"  . Alcohol Use: 0.0 oz/week    0 Standard drinks or equivalent per week   OB History    Gravida Para Term Preterm AB TAB SAB Ectopic Multiple Living   1              Review of Systems  Constitutional: Negative for fever and chills.  Musculoskeletal: Positive for back pain, gait problem and neck pain.  Neurological: Negative for dizziness and headaches.  All other systems reviewed and are negative.     Allergies  Amoxicillin  Home Medications   Prior to Admission medications   Medication Sig Start Date End Date Taking? Authorizing Provider  HUMULIN 70/30 KWIKPEN (70-30) 100 UNIT/ML PEN Inject into the skin 2 (two) times  daily. 30 units in the morning  50 units at night 03/28/15  Yes Historical Provider, MD  losartan (COZAAR) 25 MG tablet Take 25 mg by mouth daily. 02/27/15  Yes Historical Provider, MD  oxyCODONE (ROXICODONE) 15 MG immediate release tablet Take 0.5-1 tablets (7.5-15 mg total) by mouth every 6 (six) hours as needed for pain. Patient taking differently: Take 15 mg by mouth every 6 (six) hours as needed for pain.  01/02/15  Yes Ivan Anchors Love, PA-C  baclofen (LIORESAL) 10 MG tablet Take 0.5 tablets (5 mg total) by mouth 4 (four) times daily. For muscle spasms Patient not taking: Reported on 04/15/2015 01/02/15   Ivan Anchors Love, PA-C  bisacodyl (DULCOLAX) 10 MG suppository Use a suppository every other day if you have not had a BM in 48 hours Patient not taking: Reported on 04/15/2015 01/02/15    Ivan Anchors Love, PA-C  ciprofloxacin (CIPRO) 250 MG tablet Take 1 tablet (250 mg total) by mouth 2 (two) times daily. Patient not taking: Reported on 04/15/2015 01/03/15   Ivan Anchors Love, PA-C  diclofenac sodium (VOLTAREN) 1 % GEL Apply 2 g topically 3 (three) times daily. To bilateral knees Patient not taking: Reported on 04/15/2015 01/02/15   Ivan Anchors Love, PA-C  famotidine (PEPCID) 20 MG tablet Take 1 tablet (20 mg total) by mouth 2 (two) times daily. Patient not taking: Reported on 04/15/2015 01/02/15   Ivan Anchors Love, PA-C  gabapentin (NEURONTIN) 300 MG capsule Take 1 capsule (300 mg total) by mouth 3 (three) times daily. Patient not taking: Reported on 04/15/2015 01/02/15   Ivan Anchors Love, PA-C  insulin aspart (NOVOLOG) 100 UNIT/ML injection Inject 10 Units into the skin 3 (three) times daily. Patient not taking: Reported on 04/15/2015 01/02/15   Ivan Anchors Love, PA-C  insulin glargine (LANTUS) 100 UNIT/ML injection Inject 0.4 mLs (40 Units total) into the skin at bedtime. Patient not taking: Reported on 04/15/2015 01/02/15   Ivan Anchors Love, PA-C  metoCLOPramide (REGLAN) 5 MG tablet Take one tablet before meals and at bedtime for a week. Then decrease to one tablet before meals for a week from 2/10-2/16. Then decrease to one tablet twice a day before meals 2/17-2/24. Then take one daily till gone. Patient not taking: Reported on 04/15/2015 01/02/15   Ivan Anchors Love, PA-C  metoprolol tartrate (LOPRESSOR) 25 MG tablet Take 0.5 tablets (12.5 mg total) by mouth daily. Patient not taking: Reported on 04/15/2015 01/02/15   Ivan Anchors Love, PA-C  morphine (MS CONTIN) 15 MG 12 hr tablet Take 1 tablet (15 mg total) by mouth every 12 (twelve) hours. Patient not taking: Reported on 04/15/2015 01/02/15   Ivan Anchors Love, PA-C  senna (SENOKOT) 8.6 MG TABS tablet Take 2 tablets (17.2 mg total) by mouth 2 (two) times daily. For constipation Patient not taking: Reported on 04/15/2015 01/02/15   Ivan Anchors Love, PA-C   BP 127/55 mmHg  Pulse 88   Temp(Src) 98.3 F (36.8 C) (Oral)  Resp 20  SpO2 100%  LMP 03/31/2015 Physical Exam  Constitutional: She is oriented to person, place, and time. She appears well-developed and well-nourished.  HENT:  Head: Normocephalic and atraumatic.  Eyes: Conjunctivae are normal.  Neck: Normal range of motion. Neck supple.  Linear vertical surgical scar along the C-spine.  Cardiovascular: Normal rate, regular rhythm and normal heart sounds.   Pulmonary/Chest: Effort normal and breath sounds normal.  Abdominal: Soft. There is no tenderness.  Musculoskeletal: Normal range of motion.  Neurological: She is  alert and oriented to person, place, and time. GCS eye subscore is 4. GCS verbal subscore is 5. GCS motor subscore is 6.  Right sided lower and upper extremity weakness.   Skin: Skin is warm and dry.  Nursing note and vitals reviewed.   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 Pain and burning of the upper extremities.  EXAM: MRI CERVICAL SPINE WITHOUT AND WITH CONTRAST  TECHNIQUE: Multiplanar and multiecho pulse sequences of the cervical spine, to include the craniocervical junction and cervicothoracic junction, were obtained according to standard protocol without and with intravenous contrast.  CONTRAST:  60mL MULTIHANCE GADOBENATE DIMEGLUMINE 529 MG/ML IV SOLN  COMPARISON:  12/11/2014.  12/05/2014.  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 There is a fluid collection along the operative approach, not unexpected.  Unlikely to represent CSF leak.  Mass effect within the cord is markedly diminished. Cord diameter does continue to increase slightly at the previous level of the tumor. There is very low level enhancement in this region. I think at this point that this is indeterminate for postoperative enhancement versus is a small amount of residual/recurrent tumor. Certainly, the latter is not excluded and a follow-up study in 2-3 months would be suggested.  There is no evidence of cord infarction or unexpected complication. No degenerative change.  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 Slight enlargement of the cord diameter at the level of surgery, compared to the normal cord diameter. Very low level enhancement at the previous site of the tumor. The study is indeterminate for postoperative change versus residual/recurrent tumor. Follow-up study in 2-3 months is suggested.   Electronically Signed   By: Nelson Chimes M.D.   On: 04/15/2015 17:01     EKG Interpretation None      MDM   Final diagnoses:  Neck pain  Bilateral low back pain without sciatica  Patient with history of cervical tumor removed on 12/13/14.  She has some bowel and bladder incontinence and right sided weakness since before the surgery and is NOT new.  She is complaining of increased neck and back pain.  She was seen by her neurologist, Dr. Annette Stable, last month and she is scheduled for an MRI c-spine on 05/01/15. She is on percocet 15mg , morphine, and gabapentin for pain.  On exam she has existing right sided weakness post surgery. She is afebrile and vitals are stable.   12:14 Lorriane Shire from neurosurgery stated that Dr. Annette Stable is aware of the patient and is currently in surgery.  He will call back.   12:22 Lorriane Shire called back and stated to move forward with MRI of cervical spine and to give dilaudid for pain.  Medications  HYDROmorphone (DILAUDID) injection 1 mg (1 mg Intravenous Given 04/15/15  1044)  HYDROmorphone (DILAUDID) injection 0.5 mg (0.5 mg Intramuscular Given 04/15/15 1158)  ondansetron (ZOFRAN-ODT) disintegrating tablet 8 mg (8 mg Oral Given 04/15/15 1337)  gadobenate dimeglumine (MULTIHANCE)  injection 20 mL (20 mLs Intravenous Contrast Given 04/15/15 1643)  MRI does not show significant new growth.   17:22 I spoke to Dr. Arnoldo Morale who looked at the MRI and recommended the patient follow up in the office. I did explain that the patient was on percocet 15mg .  He did not mention any additional pain medications.  I explained the results to the patient as well as calling the office for a follow up appointment.  She agreed with the plan and I did explain that I could not give her additional pain medications.      Ottie Glazier, PA-C 04/15/15 1839  Orlie Dakin, MD 04/16/15 1524

## 2015-04-15 NOTE — ED Notes (Signed)
Pt to MRI at this time.

## 2015-05-01 ENCOUNTER — Encounter (HOSPITAL_COMMUNITY): Payer: Self-pay | Admitting: *Deleted

## 2015-05-01 ENCOUNTER — Inpatient Hospital Stay: Admission: RE | Admit: 2015-05-01 | Payer: Self-pay | Source: Ambulatory Visit

## 2015-05-01 ENCOUNTER — Emergency Department (HOSPITAL_COMMUNITY)
Admission: EM | Admit: 2015-05-01 | Discharge: 2015-05-02 | Disposition: A | Discharge status: Home / Self Care | Payer: Medicaid Other | Attending: Emergency Medicine | Admitting: Emergency Medicine

## 2015-05-01 DIAGNOSIS — Z880 Allergy status to penicillin: Secondary | ICD-10-CM | POA: Diagnosis not present

## 2015-05-01 DIAGNOSIS — M545 Low back pain: Secondary | ICD-10-CM

## 2015-05-01 DIAGNOSIS — E119 Type 2 diabetes mellitus without complications: Secondary | ICD-10-CM | POA: Diagnosis not present

## 2015-05-01 DIAGNOSIS — J45909 Unspecified asthma, uncomplicated: Secondary | ICD-10-CM | POA: Insufficient documentation

## 2015-05-01 DIAGNOSIS — R109 Unspecified abdominal pain: Secondary | ICD-10-CM | POA: Diagnosis present

## 2015-05-01 DIAGNOSIS — Z8742 Personal history of other diseases of the female genital tract: Secondary | ICD-10-CM | POA: Insufficient documentation

## 2015-05-01 DIAGNOSIS — Z794 Long term (current) use of insulin: Secondary | ICD-10-CM | POA: Insufficient documentation

## 2015-05-01 DIAGNOSIS — Z872 Personal history of diseases of the skin and subcutaneous tissue: Secondary | ICD-10-CM | POA: Insufficient documentation

## 2015-05-01 DIAGNOSIS — Z8659 Personal history of other mental and behavioral disorders: Secondary | ICD-10-CM | POA: Diagnosis not present

## 2015-05-01 DIAGNOSIS — Z3202 Encounter for pregnancy test, result negative: Secondary | ICD-10-CM | POA: Insufficient documentation

## 2015-05-01 DIAGNOSIS — N390 Urinary tract infection, site not specified: Secondary | ICD-10-CM | POA: Insufficient documentation

## 2015-05-01 DIAGNOSIS — I10 Essential (primary) hypertension: Secondary | ICD-10-CM | POA: Insufficient documentation

## 2015-05-01 DIAGNOSIS — Z862 Personal history of diseases of the blood and blood-forming organs and certain disorders involving the immune mechanism: Secondary | ICD-10-CM | POA: Insufficient documentation

## 2015-05-01 LAB — COMPREHENSIVE METABOLIC PANEL
ALT: 10 U/L — ABNORMAL LOW (ref 14–54)
AST: 18 U/L (ref 15–41)
Albumin: 2.8 g/dL — ABNORMAL LOW (ref 3.5–5.0)
Alkaline Phosphatase: 86 U/L (ref 38–126)
Anion gap: 11 (ref 5–15)
BUN: 9 mg/dL (ref 6–20)
CO2: 24 mmol/L (ref 22–32)
Calcium: 8.7 mg/dL — ABNORMAL LOW (ref 8.9–10.3)
Chloride: 102 mmol/L (ref 101–111)
Creatinine, Ser: 0.81 mg/dL (ref 0.44–1.00)
GFR calc Af Amer: >60 mL/min (ref >60)
GFR calc non Af Amer: >60 mL/min (ref >60)
Glucose, Bld: 131 mg/dL — ABNORMAL HIGH (ref 65–99)
Potassium: 3.7 mmol/L (ref 3.5–5.1)
Sodium: 137 mmol/L (ref 135–145)
Total Bilirubin: 0.7 mg/dL (ref 0.3–1.2)
Total Protein: 7.3 g/dL (ref 6.5–8.1)

## 2015-05-01 LAB — CBC WITH DIFFERENTIAL/PLATELET
Basophils Absolute: 0 10*3/uL (ref 0.0–0.1)
Basophils Relative: 0 % (ref 0–1)
Eosinophils Absolute: 0 10*3/uL (ref 0.0–0.7)
Eosinophils Relative: 0 % (ref 0–5)
HCT: 31.7 % — ABNORMAL LOW (ref 36.0–46.0)
Hemoglobin: 10 g/dL — ABNORMAL LOW (ref 12.0–15.0)
Lymphocytes Relative: 21 % (ref 12–46)
Lymphs Abs: 3.4 10*3/uL (ref 0.7–4.0)
MCH: 21.2 pg — ABNORMAL LOW (ref 26.0–34.0)
MCHC: 31.5 g/dL (ref 30.0–36.0)
MCV: 67.3 fL — ABNORMAL LOW (ref 78.0–100.0)
Monocytes Absolute: 0.5 10*3/uL (ref 0.1–1.0)
Monocytes Relative: 3 % (ref 3–12)
Neutro Abs: 12.2 10*3/uL — ABNORMAL HIGH (ref 1.7–7.7)
Neutrophils Relative %: 76 % (ref 43–77)
Platelets: 422 10*3/uL — ABNORMAL HIGH (ref 150–400)
RBC: 4.71 MIL/uL (ref 3.87–5.11)
RDW: 18.1 % — ABNORMAL HIGH (ref 11.5–15.5)
WBC: 16.1 10*3/uL — ABNORMAL HIGH (ref 4.0–10.5)

## 2015-05-01 LAB — LIPASE, BLOOD: Lipase: 17 U/L — ABNORMAL LOW (ref 22–51)

## 2015-05-01 NOTE — ED Notes (Signed)
Pt c/o pain to rt side x 1 hr. C/o diarrhea x6.

## 2015-05-02 LAB — URINALYSIS, ROUTINE W REFLEX MICROSCOPIC
Bilirubin Urine: NEGATIVE
Glucose, UA: NEGATIVE mg/dL
Ketones, ur: NEGATIVE mg/dL
Leukocytes, UA: NEGATIVE
Nitrite: NEGATIVE
Protein, ur: 100 mg/dL — AB
Specific Gravity, Urine: 1.026 (ref 1.005–1.030)
Urobilinogen, UA: 0.2 mg/dL (ref 0.0–1.0)
pH: 5 (ref 5.0–8.0)

## 2015-05-02 LAB — URINE MICROSCOPIC-ADD ON

## 2015-05-02 LAB — POC URINE PREG, ED: Preg Test, Ur: NEGATIVE

## 2015-05-02 MED ORDER — ACETAMINOPHEN 325 MG PO TABS
650.0000 mg | ORAL_TABLET | Freq: Once | ORAL | Status: AC
  Administered 2015-05-02: 650 mg via ORAL
  Filled 2015-05-02: qty 2

## 2015-05-02 MED ORDER — ONDANSETRON 4 MG PO TBDP
8.0000 mg | ORAL_TABLET | Freq: Once | ORAL | Status: AC
  Administered 2015-05-02: 8 mg via ORAL
  Filled 2015-05-02: qty 2

## 2015-05-02 MED ORDER — CEPHALEXIN 500 MG PO CAPS: 500.0000 mg | ORAL_CAPSULE | Freq: Four times a day (QID) | ORAL | Status: DC

## 2015-05-02 MED ORDER — CEPHALEXIN 250 MG PO CAPS
500.0000 mg | ORAL_CAPSULE | Freq: Once | ORAL | Status: AC
  Administered 2015-05-02: 500 mg via ORAL
  Filled 2015-05-02: qty 2

## 2015-05-02 MED ORDER — HYDROMORPHONE HCL 1 MG/ML IJ SOLN
2.0000 mg | Freq: Once | INTRAMUSCULAR | Status: AC
Start: 2015-05-02 — End: 2015-05-02
  Administered 2015-05-02: 2 mg via INTRAMUSCULAR
  Filled 2015-05-02: qty 2

## 2015-05-02 NOTE — Discharge Instructions (Signed)
Take Tylenol every 4 hours for fever. Use heat and sore area of your back 3 or 4 times a day. Start taking the interbody prescription in the morning. Follow-up with your doctor for checkup in one week.     Back Pain, Adult Low back pain is very common. About 1 in 5 people have back pain.The cause of low back pain is rarely dangerous. The pain often gets better over time.About half of people with a sudden onset of back pain feel better in just 2 weeks. About 8 in 10 people feel better by 6 weeks.  CAUSES Some common causes of back pain include:  Strain of the muscles or ligaments supporting the spine.  Wear and tear (degeneration) of the spinal discs.  Arthritis.  Direct injury to the back. DIAGNOSIS Most of the time, the direct cause of low back pain is not known.However, back pain can be treated effectively even when the exact cause of the pain is unknown.Answering your caregiver's questions about your overall health and symptoms is one of the most accurate ways to make sure the cause of your pain is not dangerous. If your caregiver needs more information, he or she may order lab work or imaging tests (X-rays or MRIs).However, even if imaging tests show changes in your back, this usually does not require surgery. HOME CARE INSTRUCTIONS For many people, back pain returns.Since low back pain is rarely dangerous, it is often a condition that people can learn to [facility] their own.   Remain active. It is stressful on the back to sit or stand in one place. Do not sit, drive, or stand in one place for more than 30 minutes at a time. Take short walks on level surfaces as soon as pain allows.Try to increase the length of time you walk each day.  Do not stay in bed.Resting more than 1 or 2 days can delay your recovery.  Do not avoid exercise or work.Your body is made to move.It is not dangerous to be active, even though your back may hurt.Your back will likely heal faster if you  return to being active before your pain is gone.  Pay attention to your body when you bend and lift. Many people have less discomfortwhen lifting if they bend their knees, keep the load close to their bodies,and avoid twisting. Often, the most comfortable positions are those that put less stress on your recovering back.  Find a comfortable position to sleep. Use a firm mattress and lie on your side with your knees slightly bent. If you lie on your back, put a pillow under your knees.  Only take over-the-counter or prescription medicines as directed by your caregiver. Over-the-counter medicines to reduce pain and inflammation are often the most helpful.Your caregiver may prescribe muscle relaxant drugs.These medicines help dull your pain so you can more quickly return to your normal activities and healthy exercise.  Put ice on the injured area.  Put ice in a plastic bag.  Place a towel between your skin and the bag.  Leave the ice on for 15-20 minutes, 03-04 times a day for the first 2 to 3 days. After that, ice and heat may be alternated to reduce pain and spasms.  Ask your caregiver about trying back exercises and gentle massage. This may be of some benefit.  Avoid feeling anxious or stressed.Stress increases muscle tension and can worsen back pain.It is important to recognize when you are anxious or stressed and learn ways to manage it.Exercise is a  great option. SEEK MEDICAL CARE IF:  You have pain that is not relieved with rest or medicine.  You have pain that does not improve in 1 week.  You have new symptoms.  You are generally not feeling well. SEEK IMMEDIATE MEDICAL CARE IF:   You have pain that radiates from your back into your legs.  You develop new bowel or bladder control problems.  You have unusual weakness or numbness in your arms or legs.  You develop nausea or vomiting.  You develop abdominal pain.  You feel faint. Document Released: 11/16/2005  Document Revised: 05/17/2012 Document Reviewed: 03/20/2014 [facility] Patient Information 2015 [place], [place]. This information is not intended to replace advice given to you by your health care provider. Make sure you discuss any questions you have with your health care provider.  Urinary Tract Infection Urinary tract infections (UTIs) can develop anywhere along your urinary tract. Your urinary tract is your body's drainage system for removing wastes and extra water. Your urinary tract includes two kidneys, two ureters, a bladder, and a urethra. Your kidneys are a pair of bean-shaped organs. Each kidney is about the size of your fist. They are located below your ribs, one on each side of your spine. CAUSES Infections are caused by microbes, which are microscopic organisms, including fungi, viruses, and bacteria. These organisms are so small that they can only be seen through a microscope. Bacteria are the microbes that most commonly cause UTIs. SYMPTOMS  Symptoms of UTIs may vary by age and gender of the patient and by the location of the infection. Symptoms in young women typically include a frequent and intense urge to urinate and a painful, burning feeling in the bladder or urethra during urination. Older women and men are more likely to be tired, shaky, and weak and have muscle aches and abdominal pain. A fever may mean the infection is in your kidneys. Other symptoms of a kidney infection include pain in your back or sides below the ribs, nausea, and vomiting. DIAGNOSIS To diagnose a UTI, your caregiver will ask you about your symptoms. Your caregiver also will ask to provide a urine sample. The urine sample will be tested for bacteria and white blood cells. White blood cells are made by your body to help fight infection. TREATMENT  Typically, UTIs can be treated with medication. Because most UTIs are caused by a bacterial infection, they usually can be treated with the use of antibiotics. The  choice of antibiotic and length of treatment depend on your symptoms and the type of bacteria causing your infection. HOME CARE INSTRUCTIONS  If you were prescribed antibiotics, take them exactly as your caregiver instructs you. Finish the medication even if you feel better after you have only taken some of the medication.  Drink enough water and fluids to keep your urine clear or pale yellow.  Avoid caffeine, tea, and carbonated beverages. They tend to irritate your bladder.  Empty your bladder often. Avoid holding urine for long periods of time.  Empty your bladder before and after sexual intercourse.  After a bowel movement, women should cleanse from front to back. Use each tissue only once. SEEK MEDICAL CARE IF:   You have back pain.  You develop a fever.  Your symptoms do not begin to resolve within 3 days. SEEK IMMEDIATE MEDICAL CARE IF:   You have severe back pain or lower abdominal pain.  You develop chills.  You have nausea or vomiting.  You have continued burning or discomfort  with urination. MAKE SURE YOU:   Understand these instructions.  Will watch your condition.  Will get help right away if you are not doing well or get worse. Document Released: 08/26/2005 Document Revised: 05/17/2012 Document Reviewed: 12/25/2011 [facility] Patient Information 2015 [place], [place]. This information is not intended to replace advice given to you by your health care provider. Make sure you discuss any questions you have with your health care provider.

## 2015-05-02 NOTE — ED Provider Notes (Signed)
CSN: [id number]     Arrival date & time 05/01/15  2214 History   None    Chief Complaint  Patient presents with  . Flank Pain     (Consider location/radiation/quality/duration/timing/severity/associated sxs/prior Treatment) HPI   Cynthia Rosales is a 34 y.o. female who presents for evaluation of right lower back pain described as sharp in nature, onset suddenly about 4 hours ago. She has urinary urgency, and some leakage of urine, but no frank urinary incontinence, hematuria, dysuria or urinary frequency. She was felt warm for several days. No nausea, vomiting, weakness or dizziness. She's had several episodes of diarrhea today. No vomiting today. She was here 2 weeks ago with neck pain, and was comprehensively evaluated. He followed up with her neurosurgeon last week and he told her to continue taking pain medicine and follow-up with him in 6 months. There are no other known modifying factors.   Past Medical History  Diagnosis Date  . Hypertension   . Asthma   . Type II diabetes mellitus   . History of blood transfusion     "after I had one of my kids"  . Sickle cell disease   . Anemia   . Depression   . Ovarian cyst   . Renal disorder     kidney stones  . Abscess of bursa, left elbow 06/2013    Treated with I and D/antibiotics.    Past Surgical History  Procedure Laterality Date  . Cesarean section  2000; 2007; 2011  . Dilation and curettage of uterus    . Appendectomy  2013  . Reduction mammaplasty Bilateral 1998  . Laminectomy N/A 12/13/2014    Procedure: CERVICAL LAMINECTOMY FOR INTRADURAL TUMOR;  Surgeon: Charlie Pitter, MD;  Location: [facility];  Service: Neurosurgery;  Laterality: N/A;  posterior   Family History  Problem Relation Age of Onset  . Breast cancer Maternal Aunt   . Ovarian cancer Maternal Grandmother   . Breast cancer Maternal Aunt   . Migraines Neg Hx   . Diabetes Mother   . Hypertension Mother    History  Substance Use Topics  . Smoking status:  Never Smoker   . Smokeless tobacco: Never Used     Comment: 03/27/2014 "smoked ~ 1 cigarette/day; quit in ~ 2013"  . Alcohol Use: 0.0 oz/week    0 Standard drinks or equivalent per week   OB History    Gravida Para Term Preterm AB TAB SAB Ectopic Multiple Living   1              Review of Systems  All other systems reviewed and are negative.     Allergies  Amoxicillin  Home Medications   Prior to Admission medications   Medication Sig Start Date End Date Taking? Authorizing Provider  baclofen (LIORESAL) 10 MG tablet Take 0.5 tablets (5 mg total) by mouth 4 (four) times daily. For muscle spasms Patient not taking: Reported on 04/15/2015 01/02/15   Ivan Anchors Love, PA-C  bisacodyl (DULCOLAX) 10 MG suppository Use a suppository every other day if you have not had a BM in 48 hours Patient not taking: Reported on 04/15/2015 01/02/15   Ivan Anchors Love, PA-C  ciprofloxacin (CIPRO) 250 MG tablet Take 1 tablet (250 mg total) by mouth 2 (two) times daily. Patient not taking: Reported on 04/15/2015 01/03/15   Ivan Anchors Love, PA-C  diclofenac sodium (VOLTAREN) 1 % GEL Apply 2 g topically 3 (three) times daily. To bilateral knees Patient not taking:  Reported on 04/15/2015 01/02/15   Ivan Anchors Love, PA-C  famotidine (PEPCID) 20 MG tablet Take 1 tablet (20 mg total) by mouth 2 (two) times daily. Patient not taking: Reported on 04/15/2015 01/02/15   Ivan Anchors Love, PA-C  gabapentin (NEURONTIN) 300 MG capsule Take 1 capsule (300 mg total) by mouth 3 (three) times daily. Patient not taking: Reported on 04/15/2015 01/02/15   Ivan Anchors Love, PA-C  HUMULIN 70/30 KWIKPEN (70-30) 100 UNIT/ML PEN Inject into the skin 2 (two) times daily. 30 units in the morning  50 units at night 03/28/15   Historical Provider, MD  insulin aspart (NOVOLOG) 100 UNIT/ML injection Inject 10 Units into the skin 3 (three) times daily. Patient not taking: Reported on 04/15/2015 01/02/15   Ivan Anchors Love, PA-C  insulin glargine (LANTUS) 100 UNIT/ML  injection Inject 0.4 mLs (40 Units total) into the skin at bedtime. Patient not taking: Reported on 04/15/2015 01/02/15   Ivan Anchors Love, PA-C  losartan (COZAAR) 25 MG tablet Take 25 mg by mouth daily. 02/27/15   Historical Provider, MD  metoCLOPramide (REGLAN) 5 MG tablet Take one tablet before meals and at bedtime for a week. Then decrease to one tablet before meals for a week from 2/10-2/16. Then decrease to one tablet twice a day before meals 2/17-2/24. Then take one daily till gone. Patient not taking: Reported on 04/15/2015 01/02/15   Ivan Anchors Love, PA-C  metoprolol tartrate (LOPRESSOR) 25 MG tablet Take 0.5 tablets (12.5 mg total) by mouth daily. Patient not taking: Reported on 04/15/2015 01/02/15   Ivan Anchors Love, PA-C  morphine (MS CONTIN) 15 MG 12 hr tablet Take 1 tablet (15 mg total) by mouth every 12 (twelve) hours. Patient not taking: Reported on 04/15/2015 01/02/15   Ivan Anchors Love, PA-C  oxyCODONE (ROXICODONE) 15 MG immediate release tablet Take 0.5-1 tablets (7.5-15 mg total) by mouth every 6 (six) hours as needed for pain. Patient taking differently: Take 15 mg by mouth every 6 (six) hours as needed for pain.  01/02/15   Bary Leriche, PA-C  senna (SENOKOT) 8.6 MG TABS tablet Take 2 tablets (17.2 mg total) by mouth 2 (two) times daily. For constipation Patient not taking: Reported on 04/15/2015 01/02/15   Ivan Anchors Love, PA-C   BP 137/75 mmHg  Pulse 104  Temp(Src) 101 F (38.3 C) (Rectal)  Resp 16  SpO2 98%  LMP 03/31/2015 Physical Exam  Constitutional: She is oriented to person, place, and time. She appears well-developed. No distress.  Morbidly obese  HENT:  Head: Normocephalic and atraumatic.  Right Ear: External ear normal.  Left Ear: External ear normal.  Eyes: Conjunctivae and EOM are normal. Pupils are equal, round, and reactive to light.  Neck: Normal range of motion and phonation normal. Neck supple.  Cardiovascular: Normal rate, regular rhythm and normal heart sounds.    Pulmonary/Chest: Effort normal and breath sounds normal. She exhibits no bony tenderness.  Abdominal: Soft. There is no tenderness.  Genitourinary:  No costovertebral angle tenderness.  Musculoskeletal:  Tender right lumbar, and pain at this site with movement.  Neurological: She is alert and oriented to person, place, and time. No cranial nerve deficit or sensory deficit. She exhibits normal muscle tone. Coordination normal.  Skin: Skin is warm, dry and intact.  Psychiatric: She has a normal mood and affect. Her behavior is normal. Judgment and thought content normal.  Nursing note and vitals reviewed.   ED Course  Procedures (including critical care time)  Medications  HYDROmorphone (DILAUDID) injection 2 mg (not administered)  ondansetron (ZOFRAN-ODT) disintegrating tablet 8 mg (not administered)    Patient Vitals for the past 24 hrs:  BP Temp Temp src Pulse Resp SpO2  05/02/15 0011 - 101 F (38.3 C) Rectal - - -  05/02/15 0009 - - - 105 20 100 %  05/02/15 0002 - 99.6 F (37.6 C) Oral - - -  05/02/15 0001 137/75 mmHg - - 104 - 98 %  05/01/15 2220 148/71 mmHg 98.3 F (36.8 C) Oral 98 16 100 %    12:29 AM Reevaluation with update and discussion. After initial assessment and treatment, an updated evaluation reveals she is more comfortable now and complains only of mild suprapubic discomfort. Meris Reede L    Labs Review Labs Reviewed  CBC WITH DIFFERENTIAL/PLATELET - Abnormal; Notable for the following:    WBC 16.1 (*)    Hemoglobin 10.0 (*)    HCT 31.7 (*)    MCV 67.3 (*)    MCH 21.2 (*)    RDW 18.1 (*)    Platelets 422 (*)    Neutro Abs 12.2 (*)    All other components within normal limits  COMPREHENSIVE METABOLIC PANEL - Abnormal; Notable for the following:    Glucose, Bld 131 (*)    Calcium 8.7 (*)    Albumin 2.8 (*)    ALT 10 (*)    All other components within normal limits  LIPASE, BLOOD - Abnormal; Notable for the following:    Lipase 17 (*)    All  other components within normal limits  URINALYSIS, ROUTINE W REFLEX MICROSCOPIC (NOT AT [facility])  POC URINE PREG, ED    Imaging Review No results found.   EKG Interpretation None      MDM   Final diagnoses:  UTI (lower urinary tract infection)  Low back pain without sciatica, unspecified back pain laterality    Low back pain, with signs and symptoms of cystitis. Mild elevation of white blood cell count. He seemed dynamically stable.    Daleen Bo, MD 05/02/15 [phone]

## 2015-05-03 LAB — URINE CULTURE
Colony Count: 60000
Special Requests: NORMAL

## 2015-05-23 ENCOUNTER — Emergency Department (HOSPITAL_COMMUNITY): Payer: Medicaid Other

## 2015-05-23 ENCOUNTER — Emergency Department (HOSPITAL_COMMUNITY)
Admission: EM | Admit: 2015-05-23 | Discharge: 2015-05-23 | Disposition: A | Discharge status: Home / Self Care | Payer: Medicaid Other | Attending: Emergency Medicine | Admitting: Emergency Medicine

## 2015-05-23 ENCOUNTER — Encounter (HOSPITAL_COMMUNITY): Payer: Self-pay | Admitting: Emergency Medicine

## 2015-05-23 DIAGNOSIS — W1839XA Other fall on same level, initial encounter: Secondary | ICD-10-CM | POA: Insufficient documentation

## 2015-05-23 DIAGNOSIS — I10 Essential (primary) hypertension: Secondary | ICD-10-CM | POA: Diagnosis not present

## 2015-05-23 DIAGNOSIS — Z794 Long term (current) use of insulin: Secondary | ICD-10-CM | POA: Diagnosis not present

## 2015-05-23 DIAGNOSIS — M62830 Muscle spasm of back: Secondary | ICD-10-CM | POA: Insufficient documentation

## 2015-05-23 DIAGNOSIS — S3992XA Unspecified injury of lower back, initial encounter: Secondary | ICD-10-CM | POA: Insufficient documentation

## 2015-05-23 DIAGNOSIS — Z8742 Personal history of other diseases of the female genital tract: Secondary | ICD-10-CM | POA: Diagnosis not present

## 2015-05-23 DIAGNOSIS — Z79899 Other long term (current) drug therapy: Secondary | ICD-10-CM | POA: Insufficient documentation

## 2015-05-23 DIAGNOSIS — Z87448 Personal history of other diseases of urinary system: Secondary | ICD-10-CM | POA: Diagnosis not present

## 2015-05-23 DIAGNOSIS — Z880 Allergy status to penicillin: Secondary | ICD-10-CM | POA: Insufficient documentation

## 2015-05-23 DIAGNOSIS — Z8659 Personal history of other mental and behavioral disorders: Secondary | ICD-10-CM | POA: Diagnosis not present

## 2015-05-23 DIAGNOSIS — Z792 Long term (current) use of antibiotics: Secondary | ICD-10-CM | POA: Diagnosis not present

## 2015-05-23 DIAGNOSIS — M5416 Radiculopathy, lumbar region: Secondary | ICD-10-CM

## 2015-05-23 DIAGNOSIS — Y999 Unspecified external cause status: Secondary | ICD-10-CM | POA: Diagnosis not present

## 2015-05-23 DIAGNOSIS — J45909 Unspecified asthma, uncomplicated: Secondary | ICD-10-CM | POA: Insufficient documentation

## 2015-05-23 DIAGNOSIS — Y939 Activity, unspecified: Secondary | ICD-10-CM | POA: Diagnosis not present

## 2015-05-23 DIAGNOSIS — Z872 Personal history of diseases of the skin and subcutaneous tissue: Secondary | ICD-10-CM | POA: Diagnosis not present

## 2015-05-23 DIAGNOSIS — Y929 Unspecified place or not applicable: Secondary | ICD-10-CM | POA: Diagnosis not present

## 2015-05-23 DIAGNOSIS — Z862 Personal history of diseases of the blood and blood-forming organs and certain disorders involving the immune mechanism: Secondary | ICD-10-CM | POA: Diagnosis not present

## 2015-05-23 DIAGNOSIS — E119 Type 2 diabetes mellitus without complications: Secondary | ICD-10-CM | POA: Insufficient documentation

## 2015-05-23 DIAGNOSIS — W19XXXA Unspecified fall, initial encounter: Secondary | ICD-10-CM

## 2015-05-23 MED ORDER — KETOROLAC TROMETHAMINE 30 MG/ML IJ SOLN
30.0000 mg | Freq: Once | INTRAMUSCULAR | Status: AC
Start: 2015-05-23 — End: 2015-05-23
  Administered 2015-05-23: 30 mg via INTRAVENOUS
  Filled 2015-05-23: qty 1

## 2015-05-23 MED ORDER — HYDROMORPHONE HCL 1 MG/ML IJ SOLN
1.0000 mg | Freq: Once | INTRAMUSCULAR | Status: AC
  Administered 2015-05-23: 1 mg via INTRAVENOUS
  Filled 2015-05-23: qty 1

## 2015-05-23 MED ORDER — ONDANSETRON HCL 4 MG/2ML IJ SOLN
4.0000 mg | Freq: Once | INTRAMUSCULAR | Status: AC
  Administered 2015-05-23: 4 mg via INTRAVENOUS
  Filled 2015-05-23: qty 2

## 2015-05-23 MED ORDER — OXYCODONE-ACETAMINOPHEN 5-325 MG PO TABS: 1.0000 | ORAL_TABLET | Freq: Four times a day (QID) | ORAL | Status: DC | PRN

## 2015-05-23 MED ORDER — DIAZEPAM 5 MG PO TABS
5.0000 mg | ORAL_TABLET | Freq: Once | ORAL | Status: AC
  Administered 2015-05-23: 5 mg via ORAL
  Filled 2015-05-23: qty 1

## 2015-05-23 MED ORDER — DIAZEPAM 5 MG PO TABS: 5.0000 mg | ORAL_TABLET | Freq: Three times a day (TID) | ORAL | Status: DC | PRN

## 2015-05-23 MED ORDER — PREDNISONE 50 MG PO TABS: 50.0000 mg | ORAL_TABLET | Freq: Every day | ORAL | Status: DC

## 2015-05-23 NOTE — Discharge Instructions (Signed)
Return here as needed. Follow up with your neurosurgeon. °

## 2015-05-23 NOTE — ED Provider Notes (Signed)
CSN: [id number]     Arrival date & time 05/23/15  1208 History   First MD Initiated Contact with Patient 05/23/15 1650     Chief Complaint  Patient presents with  . Fall  . Back Pain     (Consider location/radiation/quality/duration/timing/severity/associated sxs/prior Treatment) HPI Patient presents to the emergency department with lower back pain.  Patient states that she has been dealing with this for several months and that she uses a walker at home.  The patient states that her right leg hurts more than her left.  She has pain into both legs.  Patient, states she has been dealing with this since her cervical spine surgery.  The patient denies nausea, vomiting, numbness, weakness, dizziness, headache, blurred vision, fever, abdominal pain, chest pain, shortness of breath, lightheadedness or syncope.  The patient states that she has been taking pain medications from her doctor Past Medical History  Diagnosis Date  . Hypertension   . Asthma   . Type II diabetes mellitus   . History of blood transfusion     "after I had one of my kids"  . Sickle cell disease   . Anemia   . Depression   . Ovarian cyst   . Renal disorder     kidney stones  . Abscess of bursa, left elbow 06/2013    Treated with I and D/antibiotics.    Past Surgical History  Procedure Laterality Date  . Cesarean section  2000; 2007; 2011  . Dilation and curettage of uterus    . Appendectomy  2013  . Reduction mammaplasty Bilateral 1998  . Laminectomy N/A 12/13/2014    Procedure: CERVICAL LAMINECTOMY FOR INTRADURAL TUMOR;  Surgeon: Charlie Pitter, MD;  Location: [facility];  Service: Neurosurgery;  Laterality: N/A;  posterior   Family History  Problem Relation Age of Onset  . Breast cancer Maternal Aunt   . Ovarian cancer Maternal Grandmother   . Breast cancer Maternal Aunt   . Migraines Neg Hx   . Diabetes Mother   . Hypertension Mother    History  Substance Use Topics  . Smoking status: Never Smoker   .  Smokeless tobacco: Never Used     Comment: 03/27/2014 "smoked ~ 1 cigarette/day; quit in ~ 2013"  . Alcohol Use: 0.0 oz/week    0 Standard drinks or equivalent per week   OB History    Gravida Para Term Preterm AB TAB SAB Ectopic Multiple Living   1              Review of Systems  All other systems negative except as documented in the HPI. All pertinent positives and negatives as reviewed in the HPI.  Allergies  Amoxicillin  Home Medications   Prior to Admission medications   Medication Sig Start Date End Date Taking? Authorizing Provider  HUMULIN 70/30 KWIKPEN (70-30) 100 UNIT/ML PEN Inject 30-50 Units into the skin 2 (two) times daily. 30 units in the morning  50 units at night 03/28/15  Yes Historical Provider, MD  losartan (COZAAR) 25 MG tablet Take 25 mg by mouth daily. 02/27/15  Yes Historical Provider, MD  oxyCODONE (ROXICODONE) 15 MG immediate release tablet Take 0.5-1 tablets (7.5-15 mg total) by mouth every 6 (six) hours as needed for pain. Patient taking differently: Take 15 mg by mouth every 6 (six) hours as needed for pain.  01/02/15  Yes Ivan Anchors Love, PA-C  baclofen (LIORESAL) 10 MG tablet Take 0.5 tablets (5 mg total) by mouth 4 (  four) times daily. For muscle spasms Patient not taking: Reported on 04/15/2015 01/02/15   Ivan Anchors Love, PA-C  bisacodyl (DULCOLAX) 10 MG suppository Use a suppository every other day if you have not had a BM in 48 hours Patient not taking: Reported on 04/15/2015 01/02/15   Ivan Anchors Love, PA-C  cephALEXin (KEFLEX) 500 MG capsule Take 1 capsule (500 mg total) by mouth 4 (four) times daily. 05/02/15   Daleen Bo, MD  ciprofloxacin (CIPRO) 250 MG tablet Take 1 tablet (250 mg total) by mouth 2 (two) times daily. Patient not taking: Reported on 04/15/2015 01/03/15   Ivan Anchors Love, PA-C  diazepam (VALIUM) 5 MG tablet Take 1 tablet (5 mg total) by mouth every 8 (eight) hours as needed for muscle spasms. 05/23/15   Dalia Heading, PA-C  diclofenac sodium  (VOLTAREN) 1 % GEL Apply 2 g topically 3 (three) times daily. To bilateral knees Patient not taking: Reported on 04/15/2015 01/02/15   Ivan Anchors Love, PA-C  famotidine (PEPCID) 20 MG tablet Take 1 tablet (20 mg total) by mouth 2 (two) times daily. Patient not taking: Reported on 04/15/2015 01/02/15   Ivan Anchors Love, PA-C  gabapentin (NEURONTIN) 300 MG capsule Take 1 capsule (300 mg total) by mouth 3 (three) times daily. Patient not taking: Reported on 04/15/2015 01/02/15   Ivan Anchors Love, PA-C  insulin aspart (NOVOLOG) 100 UNIT/ML injection Inject 10 Units into the skin 3 (three) times daily. Patient not taking: Reported on 04/15/2015 01/02/15   Ivan Anchors Love, PA-C  insulin glargine (LANTUS) 100 UNIT/ML injection Inject 0.4 mLs (40 Units total) into the skin at bedtime. Patient not taking: Reported on 04/15/2015 01/02/15   Ivan Anchors Love, PA-C  metoCLOPramide (REGLAN) 5 MG tablet Take one tablet before meals and at bedtime for a week. Then decrease to one tablet before meals for a week from 2/10-2/16. Then decrease to one tablet twice a day before meals 2/17-2/24. Then take one daily till gone. Patient not taking: Reported on 04/15/2015 01/02/15   Ivan Anchors Love, PA-C  metoprolol tartrate (LOPRESSOR) 25 MG tablet Take 0.5 tablets (12.5 mg total) by mouth daily. Patient not taking: Reported on 04/15/2015 01/02/15   Ivan Anchors Love, PA-C  morphine (MS CONTIN) 15 MG 12 hr tablet Take 1 tablet (15 mg total) by mouth every 12 (twelve) hours. Patient not taking: Reported on 04/15/2015 01/02/15   Ivan Anchors Love, PA-C  oxyCODONE-acetaminophen (PERCOCET/ROXICET) 5-325 MG per tablet Take 1 tablet by mouth every 6 (six) hours as needed for severe pain. 05/23/15   Dalia Heading, PA-C  senna (SENOKOT) 8.6 MG TABS tablet Take 2 tablets (17.2 mg total) by mouth 2 (two) times daily. For constipation Patient not taking: Reported on 04/15/2015 01/02/15   Ivan Anchors Love, PA-C   BP 160/90 mmHg  Pulse 88  Temp(Src) 98.1 F (36.7 C) (Oral)  Resp  16  Ht 5\' 3"  (1.6 m)  Wt 290 lb (131.543 kg)  BMI 51.38 kg/m2  SpO2 100%  LMP 05/19/2015 Physical Exam  Constitutional: She is oriented to person, place, and time. She appears well-developed and well-nourished. No distress.  HENT:  Head: Normocephalic and atraumatic.  Eyes: Pupils are equal, round, and reactive to light.  Neck: Normal range of motion. Neck supple.  Cardiovascular: Normal rate, regular rhythm and normal heart sounds.  Exam reveals no gallop and no friction rub.   No murmur heard. Pulmonary/Chest: Effort normal and breath sounds normal. No respiratory distress.  Musculoskeletal: She exhibits no  edema.       Lumbar back: She exhibits tenderness, pain and spasm. She exhibits normal range of motion, no bony tenderness and no deformity.       Back:  Neurological: She is alert and oriented to person, place, and time. She has normal reflexes. She exhibits normal muscle tone. Coordination normal.  Skin: Skin is warm and dry.  Nursing note and vitals reviewed.   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  EXAM: LUMBAR SPINE - COMPLETE 4+ VIEW  COMPARISON:  CT scan 06/19/2013  FINDINGS: Normal alignment of the lumbar vertebral bodies. Disc spaces and vertebral bodies are maintained. No acute bony findings or destructive bony changes. The facets are normally aligned. No pars defects. The visualized bony pelvis is intact. The SI joints appear normal.  IMPRESSION: Normal alignment and no acute bony findings or significant degenerative changes.   Electronically Signed   By: Marijo Sanes M.D.   On: 05/23/2015 18:06     The tech noted that the patient could not pick up her right foot was dragging it, but I observed the patient moving her right foot while ambulating.  It  was not normal ambulation, but she was able to ambulate without dragging her foot.  The patient will need follow-up with her neurosurgeon.  She most likely needs an outpatient MRI of her lumbar spine.  She does not have any urinary incontinence, no numbness of peritoneal or perianal area.  The patient is advised to contact her neurosurgeon for further evaluation and he think that she does need an outpatient MRI, but I do disagree with the fact that the tech states that she was dragging her foot  Dalia Heading, PA-C 05/24/15 0051  Dorie Rank, MD 05/24/15 1423

## 2015-05-23 NOTE — ED Notes (Signed)
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

## 2015-05-23 NOTE — ED Notes (Signed)
Pa  at bedside. 

## 2015-05-23 NOTE — ED Notes (Signed)
Pt transported to xray 

## 2015-05-23 NOTE — ED Notes (Addendum)
Pt ambulated with difficulty, Pt stated her whole right side was hurting while ambulating. Pt unable to pick up right foot to walk, right foot dragged while ambulating.

## 2015-06-06 ENCOUNTER — Emergency Department (HOSPITAL_COMMUNITY)
Admission: EM | Admit: 2015-06-06 | Discharge: 2015-06-06 | Disposition: A | Discharge status: Home / Self Care | Payer: Medicaid Other | Source: Home / Self Care

## 2015-06-06 ENCOUNTER — Encounter (HOSPITAL_COMMUNITY): Payer: Self-pay | Admitting: Emergency Medicine

## 2015-06-06 DIAGNOSIS — S20472A Other superficial bite of left back wall of thorax, initial encounter: Secondary | ICD-10-CM | POA: Diagnosis not present

## 2015-06-06 DIAGNOSIS — IMO0001 Reserved for inherently not codable concepts without codable children: Secondary | ICD-10-CM

## 2015-06-06 NOTE — ED Notes (Signed)
Pt comes in with c/o pain to left upper bite with itching and burn after tick bite 2 dys ago States, her son pulled tick out  C/o chills with discomfort BP elevated 167/11, taking medication

## 2015-06-06 NOTE — ED Provider Notes (Signed)
CSN: [id number]     Arrival date & time 06/06/15  1953 History   None    Chief Complaint  Patient presents with  . Insect Bite   (Consider location/radiation/quality/duration/timing/severity/associated sxs/prior Treatment) Patient is a 34 y.o. female presenting with rash. The history is provided by the patient.  Rash Location:  Torso Torso rash location:  Upper back Quality: itchiness   Severity:  Mild Onset quality:  Gradual Duration:  2 days Progression:  Unchanged Chronicity:  New Context: insect bite/sting   Context comment:  Son removed embedded tick from back 2d ago, now irritated. Relieved by:  None tried Worsened by:  Nothing tried Ineffective treatments:  None tried Associated symptoms: no fever and no headaches     Past Medical History  Diagnosis Date  . Hypertension   . Asthma   . Type II diabetes mellitus   . History of blood transfusion     "after I had one of my kids"  . Sickle cell disease   . Anemia   . Depression   . Ovarian cyst   . Renal disorder     kidney stones  . Abscess of bursa, left elbow 06/2013    Treated with I and D/antibiotics.    Past Surgical History  Procedure Laterality Date  . Cesarean section  2000; 2007; 2011  . Dilation and curettage of uterus    . Appendectomy  2013  . Reduction mammaplasty Bilateral 1998  . Laminectomy N/A 12/13/2014    Procedure: CERVICAL LAMINECTOMY FOR INTRADURAL TUMOR;  Surgeon: Charlie Pitter, MD;  Location: [facility];  Service: Neurosurgery;  Laterality: N/A;  posterior   Family History  Problem Relation Age of Onset  . Breast cancer Maternal Aunt   . Ovarian cancer Maternal Grandmother   . Breast cancer Maternal Aunt   . Migraines Neg Hx   . Diabetes Mother   . Hypertension Mother    History  Substance Use Topics  . Smoking status: Never Smoker   . Smokeless tobacco: Never Used     Comment: 03/27/2014 "smoked ~ 1 cigarette/day; quit in ~ 2013"  . Alcohol Use: 0.0 oz/week    0 Standard  drinks or equivalent per week   OB History    Gravida Para Term Preterm AB TAB SAB Ectopic Multiple Living   1              Review of Systems  Constitutional: Negative.  Negative for fever.  Skin: Positive for rash and wound.  Neurological: Negative for headaches.    Allergies  Amoxicillin  Home Medications   Prior to Admission medications   Medication Sig Start Date End Date Taking? Authorizing Provider  baclofen (LIORESAL) 10 MG tablet Take 0.5 tablets (5 mg total) by mouth 4 (four) times daily. For muscle spasms Patient not taking: Reported on 04/15/2015 01/02/15   Ivan Anchors Love, PA-C  bisacodyl (DULCOLAX) 10 MG suppository Use a suppository every other day if you have not had a BM in 48 hours Patient not taking: Reported on 04/15/2015 01/02/15   Ivan Anchors Love, PA-C  cephALEXin (KEFLEX) 500 MG capsule Take 1 capsule (500 mg total) by mouth 4 (four) times daily. 05/02/15   Daleen Bo, MD  ciprofloxacin (CIPRO) 250 MG tablet Take 1 tablet (250 mg total) by mouth 2 (two) times daily. Patient not taking: Reported on 04/15/2015 01/03/15   Ivan Anchors Love, PA-C  diazepam (VALIUM) 5 MG tablet Take 1 tablet (5 mg total) by mouth every  8 (eight) hours as needed for muscle spasms. 05/23/15   Dalia Heading, PA-C  diclofenac sodium (VOLTAREN) 1 % GEL Apply 2 g topically 3 (three) times daily. To bilateral knees Patient not taking: Reported on 04/15/2015 01/02/15   Ivan Anchors Love, PA-C  famotidine (PEPCID) 20 MG tablet Take 1 tablet (20 mg total) by mouth 2 (two) times daily. Patient not taking: Reported on 04/15/2015 01/02/15   Ivan Anchors Love, PA-C  gabapentin (NEURONTIN) 300 MG capsule Take 1 capsule (300 mg total) by mouth 3 (three) times daily. Patient not taking: Reported on 04/15/2015 01/02/15   Ivan Anchors Love, PA-C  HUMULIN 70/30 KWIKPEN (70-30) 100 UNIT/ML PEN Inject 30-50 Units into the skin 2 (two) times daily. 30 units in the morning  50 units at night 03/28/15   Historical Provider, MD  insulin  aspart (NOVOLOG) 100 UNIT/ML injection Inject 10 Units into the skin 3 (three) times daily. Patient not taking: Reported on 04/15/2015 01/02/15   Ivan Anchors Love, PA-C  insulin glargine (LANTUS) 100 UNIT/ML injection Inject 0.4 mLs (40 Units total) into the skin at bedtime. Patient not taking: Reported on 04/15/2015 01/02/15   Ivan Anchors Love, PA-C  losartan (COZAAR) 25 MG tablet Take 25 mg by mouth daily. 02/27/15   Historical Provider, MD  metoCLOPramide (REGLAN) 5 MG tablet Take one tablet before meals and at bedtime for a week. Then decrease to one tablet before meals for a week from 2/10-2/16. Then decrease to one tablet twice a day before meals 2/17-2/24. Then take one daily till gone. Patient not taking: Reported on 04/15/2015 01/02/15   Ivan Anchors Love, PA-C  metoprolol tartrate (LOPRESSOR) 25 MG tablet Take 0.5 tablets (12.5 mg total) by mouth daily. Patient not taking: Reported on 04/15/2015 01/02/15   Ivan Anchors Love, PA-C  morphine (MS CONTIN) 15 MG 12 hr tablet Take 1 tablet (15 mg total) by mouth every 12 (twelve) hours. Patient not taking: Reported on 04/15/2015 01/02/15   Ivan Anchors Love, PA-C  oxyCODONE (ROXICODONE) 15 MG immediate release tablet Take 0.5-1 tablets (7.5-15 mg total) by mouth every 6 (six) hours as needed for pain. Patient taking differently: Take 15 mg by mouth every 6 (six) hours as needed for pain.  01/02/15   Bary Leriche, PA-C  oxyCODONE-acetaminophen (PERCOCET/ROXICET) 5-325 MG per tablet Take 1 tablet by mouth every 6 (six) hours as needed for severe pain. 05/23/15   Dalia Heading, PA-C  senna (SENOKOT) 8.6 MG TABS tablet Take 2 tablets (17.2 mg total) by mouth 2 (two) times daily. For constipation Patient not taking: Reported on 04/15/2015 01/02/15   Ivan Anchors Love, PA-C   BP 167/111 mmHg  Pulse 87  Temp(Src) 97.8 F (36.6 C) (Oral)  Resp 18  SpO2 100%  LMP 05/19/2015 Physical Exam  Constitutional: She is oriented to person, place, and time. She appears well-developed and  well-nourished.  Neurological: She is alert and oriented to person, place, and time.  Skin: Skin is warm and dry.  Crust removed from back lesion , area clean, no drainage or infection, no erythema, wound cleansed,dsd.  Nursing note and vitals reviewed.   ED Course  Procedures (including critical care time) Labs Review Labs Reviewed - No data to display  Imaging Review No results found.   MDM   1. Insect bite of back, left, initial encounter        Billy Fischer, MD 06/06/15 2142

## 2015-06-06 NOTE — Discharge Instructions (Signed)
Wash and clean area as able, see your doctor if further problems.

## 2015-06-19 ENCOUNTER — Encounter: Payer: Self-pay | Admitting: Neurology

## 2015-06-19 ENCOUNTER — Ambulatory Visit (INDEPENDENT_AMBULATORY_CARE_PROVIDER_SITE_OTHER): Payer: Medicaid Other | Admitting: Neurology

## 2015-06-19 VITALS — BP 168/96 | HR 97 | Ht 63.0 in | Wt 301.2 lb

## 2015-06-19 DIAGNOSIS — S14104A Unspecified injury at C4 level of cervical spinal cord, initial encounter: Secondary | ICD-10-CM

## 2015-06-19 DIAGNOSIS — R200 Anesthesia of skin: Secondary | ICD-10-CM | POA: Diagnosis not present

## 2015-06-19 DIAGNOSIS — R531 Weakness: Secondary | ICD-10-CM

## 2015-06-19 MED ORDER — DIAZEPAM 5 MG PO TABS: 5.0000 mg | ORAL_TABLET | Freq: Three times a day (TID) | ORAL | Status: DC | PRN

## 2015-06-19 NOTE — Progress Notes (Signed)
[facility]    Provider:  Dr Jaynee Eagles Referring Provider: Medicine, [facility] Primary Care Physician:  [facility]  CC:  Numbness, weakness,   HPI:  Cynthia Rosales is a 34 y.o. female here as a follow up. 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 She was discharged in February of 2016 with discharge diagnises of tetrplegia, HTN, ependymoma, c4 spinal cord injury, DM2, postoperative ileus, morbid obesity, SSD. She was admitted to rehabilitation in January 2016 for inpatient therapy. Notes state that at admission, patient required minimal assistance with mobility and ADL tasks. She improved during rehabilitation and activity tolerance, balance, posture control as well as ability to compensate for deficits. She had improvement in functional use of her bilateral upper and lower extremities.   She has no feeling from her hips down, her legs are giving out on her with falls. Her legs are cold from the knees down, her feet are cold all the time. Her right side still has numbness. If she picks up a pen, she has pain in her arms. She feels burning and sharp pain in her right arm. Dr. Shann Medal that she has been told she has severe nerve damage. The lower part of her back hurts. She is in chronic pain. He right eye is still blurry. The right side of her face is numb and sometimes she can't feel her ear. The back of the neck hurts. Her legs are giving out. She is in bed 4-5 days at a time. Sometimes she can't get out of the bed. She can't walk. She is crying profusely in the office today.   Review of Systems: Patient complains of symptoms per HPI as well as the following symptoms: Eye redness, dizziness, leg swelling, numbness, weakness, restless leg, joint pain, back pain, muscle cramps, neck pain, neck stiffness. Pertinent negatives per HPI. All others negative.   History   Social History  .  Marital Status: Single    Spouse Name: N/A  . Number of Children: 3  . Years of Education: 11   Occupational History  . Unemployed     Social History Main Topics  . Smoking status: Never Smoker   . Smokeless tobacco: Never Used     Comment: 03/27/2014 "smoked ~ 1 cigarette/day; quit in ~ 2013"  . Alcohol Use: 0.0 oz/week    0 Standard drinks or equivalent per week  . Drug Use: No  . Sexual Activity: Yes   Other Topics Concern  . Not on file   Social History Narrative   Patient lives at home with boyfriend.    Patient has 3 children    Patient does not work    Patient has an 11th grade education    Patient is right handed     Family History  Problem Relation Age of Onset  . Breast cancer Maternal Aunt   . Ovarian cancer Maternal Grandmother   . Breast cancer Maternal Aunt   . Migraines Neg Hx   . Diabetes Mother   . Hypertension Mother     Past Medical History  Diagnosis Date  . Hypertension   . Asthma   . Type II diabetes mellitus   . History of blood transfusion     "after I had one of my kids"  . Sickle cell disease   . Anemia   . Depression   . Ovarian cyst   . Renal disorder  kidney stones  . Abscess of bursa, left elbow 06/2013    Treated with I and D/antibiotics.     Past Surgical History  Procedure Laterality Date  . Cesarean section  2000; 2007; 2011  . Dilation and curettage of uterus    . Appendectomy  2013  . Reduction mammaplasty Bilateral 1998  . Laminectomy N/A 12/13/2014    Procedure: CERVICAL LAMINECTOMY FOR INTRADURAL TUMOR;  Surgeon: Charlie Pitter, MD;  Location: [facility];  Service: Neurosurgery;  Laterality: N/A;  posterior    Current Outpatient Prescriptions  Medication Sig Dispense Refill  . diazepam (VALIUM) 5 MG tablet Take 1 tablet (5 mg total) by mouth every 8 (eight) hours as needed for muscle spasms. 12 tablet 0  . HUMULIN 70/30 KWIKPEN (70-30) 100 UNIT/ML PEN Inject 30-50 Units into the skin 2 (two) times daily. 30  units in the morning  50 units at night  6  . losartan (COZAAR) 25 MG tablet Take 25 mg by mouth daily.  2  . oxyCODONE-acetaminophen (PERCOCET/ROXICET) 5-325 MG per tablet Take 1 tablet by mouth every 6 (six) hours as needed for severe pain. 20 tablet 0   No current facility-administered medications for this visit.    Allergies as of 06/19/2015 - Review Complete 06/19/2015  Allergen Reaction Noted  . Amoxicillin Hives and Rash 09/05/2013    Vitals: BP 168/96 mmHg  Pulse 97  Ht 5\' 3"  (1.6 m)  Wt 301 lb 3.2 oz (136.623 kg)  BMI 53.37 kg/m2  LMP 05/19/2015 Last Weight:  Wt Readings from Last 1 Encounters:  06/19/15 301 lb 3.2 oz (136.623 kg)   Last Height:   Ht Readings from Last 1 Encounters:  06/19/15 5\' 3"  (1.6 m)         Assessment/Plan:  34 y.o. female here as a follow up. 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 She was discharged in February of 2016 with discharge diagnises of tetrplegia, HTN, ependymoma, c4 spinal cord injury, DM2, postoperative ileus, morbid obesity, SSD. Unfortunately Cynthia Rosales is a very complicated patient and she was only scheduled for 15 minute follow-up appointment today. Her neurosurgeon and primary care notes are unavailable so I have limited information on the her clinical care since she was discharged in February. Tried to reassure patient, I need to request all the records and review them and schedule for a longer appointment. She will follow-up in one week.   request records from the following: PA Juluis Mire [facility] [address] Bryon Lions, MD  [facility] [address] [place], [place] [address]  Phone [phone] Fax [phone]  A total of 15 minutes was spent face-to-face with this patient. Over half this time was spent on counseling patient on spinal cord injury s/p ependymoma removal diagnosis and  different diagnostic and therapeutic options available.

## 2015-06-20 ENCOUNTER — Telehealth: Payer: Self-pay | Admitting: *Deleted

## 2015-06-20 NOTE — Telephone Encounter (Signed)
Patient notes from [facility] [address].

## 2015-06-20 NOTE — Telephone Encounter (Signed)
Release faxed to Dr. Earnie Larsson office requesting records.

## 2015-06-26 ENCOUNTER — Telehealth: Payer: Self-pay | Admitting: Neurology

## 2015-06-26 NOTE — Telephone Encounter (Signed)
Cynthia Rosales - would you call [name] and let her know we have not received all her medical records yet so I will need to reschedule. I will need an hour at some point, let's discuss. Looks like we got the [facility] records but not from Dr. Trenton Gammon yet.  Can you verify we do not have the records yet? thanks

## 2015-06-27 NOTE — Telephone Encounter (Signed)
Spoke w/ pt and told her we are still waiting on medical records from Dr. Trenton Gammon and need to reschedule her appt for tomorrow. Rescheduled to 07/11/15 at 8:30am for a 1 hour appointment. She verbalized understanding. Told her she will get a reminder call closer to appt time.

## 2015-06-28 ENCOUNTER — Ambulatory Visit: Payer: Self-pay | Admitting: Neurology

## 2015-07-02 ENCOUNTER — Telehealth: Payer: Self-pay | Admitting: *Deleted

## 2015-07-02 NOTE — Telephone Encounter (Signed)
Spoke with Meg from Dr. Trenton Gammon office and she stated records were sent to our office on 06/21/15 to a fax [phone]. I told her we did not receive anything and requested for her to refax records to [phone]. She stated she would have it faxed over.

## 2015-07-04 NOTE — Telephone Encounter (Signed)
Received records from Dr. Trenton Gammon office on 07/02/15.

## 2015-07-11 ENCOUNTER — Encounter: Payer: Self-pay | Admitting: Neurology

## 2015-07-11 ENCOUNTER — Encounter: Payer: Self-pay | Admitting: Physical Medicine & Rehabilitation

## 2015-07-11 ENCOUNTER — Ambulatory Visit (INDEPENDENT_AMBULATORY_CARE_PROVIDER_SITE_OTHER): Payer: Medicaid Other | Admitting: Neurology

## 2015-07-11 VITALS — BP 162/91 | HR 105 | Temp 98.3°F | Wt 337.8 lb

## 2015-07-11 DIAGNOSIS — M79601 Pain in right arm: Secondary | ICD-10-CM

## 2015-07-11 DIAGNOSIS — R202 Paresthesia of skin: Secondary | ICD-10-CM

## 2015-07-11 DIAGNOSIS — R200 Anesthesia of skin: Secondary | ICD-10-CM | POA: Diagnosis not present

## 2015-07-11 DIAGNOSIS — G8220 Paraplegia, unspecified: Secondary | ICD-10-CM

## 2015-07-11 DIAGNOSIS — C720 Malignant neoplasm of spinal cord: Secondary | ICD-10-CM | POA: Diagnosis not present

## 2015-07-11 DIAGNOSIS — R29898 Other symptoms and signs involving the musculoskeletal system: Secondary | ICD-10-CM | POA: Diagnosis not present

## 2015-07-11 DIAGNOSIS — G5601 Carpal tunnel syndrome, right upper limb: Secondary | ICD-10-CM

## 2015-07-11 DIAGNOSIS — G5602 Carpal tunnel syndrome, left upper limb: Secondary | ICD-10-CM

## 2015-07-11 DIAGNOSIS — R609 Edema, unspecified: Secondary | ICD-10-CM

## 2015-07-11 DIAGNOSIS — G5603 Carpal tunnel syndrome, bilateral upper limbs: Secondary | ICD-10-CM

## 2015-07-11 DIAGNOSIS — S14104D Unspecified injury at C4 level of cervical spinal cord, subsequent encounter: Secondary | ICD-10-CM

## 2015-07-11 MED ORDER — DULOXETINE HCL 30 MG PO CPEP: 30.0000 mg | ORAL_CAPSULE | Freq: Every day | ORAL | Status: DC

## 2015-07-11 MED ORDER — OXYCODONE HCL 15 MG PO TABS: 15.0000 mg | ORAL_TABLET | Freq: Four times a day (QID) | ORAL | Status: DC | PRN

## 2015-07-11 MED ORDER — METHYLPREDNISOLONE 4 MG PO TBPK: ORAL_TABLET | ORAL | Status: DC

## 2015-07-11 NOTE — Patient Instructions (Signed)
As far as your medications are concerned, I would like to suggest: Medrol dosepak as discussed Start cymbalta 30mg  (one pill) daily Oxycodone  As far as diagnostic testing: emg/ncs  I would like to see you back in XXXXX, sooner if we need to. Please call us with any interim questions, concerns, problems, updates or refill requests.   Please also call us for any test results so we can go over those with you on the phone.  My clinical assistant and will answer any of your questions and relay your messages to me and also relay most of my messages to you.   Our phone number is [phone]. We also have an after hours call service for urgent matters and there is a physician on-call for urgent questions. For any emergencies you know to call 911 or go to the nearest emergency room

## 2015-07-11 NOTE — Progress Notes (Signed)
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 Dr. Trenton Gammon recommended complete and total disability and they declined her. She is incontinent, she wears pampers. She doesn't know when she is urinating or defecating on herself. She is embarassed, she cries. She can't stand without using her arms. She has swelling in the legs and feels like she is walking on a balloon. She uses a walker. She has to keep her knees locked to walk. She has no balance. It hurts from the hips to the toes. She has numbness and tingling in her arms. Her whole right arm is numb, the fingertips are cold and numb. She has hand weakness and can't make a fist due to weakness. It is burning and sharp with pain. Her legs are totally stiff. She can't lift up her legs, totally stiff. She has bilateral CTS which was put off since they found the tumor. She has sharp pain in the hips and lower back. She has pain shooting in her hips. Sharp radiating pains. She is on oxycodone and may be on pain medication chronically. She has been seeing an NP but doesn' t have a primary care doctor. She is itching a lot from the morphine. She tried Lyrica and it didn't work, made her stomach hurt. Neurontin 300mg  not helping. No side effects from the gabapentin. EMG in the past showed severe carpal tunnel syndrome on the right and moderately severe on the left.  MRI cervical spine 04/15/2015;   Interval laminectomy for tumor resection of an ependymoma extending from C3-4 to C5. Slight enlargement of the cord diameter at the level of surgery, compared to the normal cord diameter. Very low level enhancement at the previous  site of the tumor. The study is ndeterminate for postoperative change versus residual/recurrent tumor. Follow-up study in 2-3 months is suggested.  :  Abnormal MRI cervical spine 12/05/2014 (with and without) demonstrating: 1. There is intradural intramedullary cystic, expansile lesion within the spinal cord at C4-C5 levels measuring 0.9x0.8x2.2cm (APxtransxSI) with internal webs/septations. Concerning for neoplastic process. Infectious, vascular or traumatic syringomyelia etiologies less likely. Consider serial imaging with post-contrast sequences for further evaluation. 2. Remainder of of spinal cord unremarkable.  EMG/NCS 09/25/2014: There is electrophysiologic evidence for a severe right Carpal Tunnel Syndrome and a moderately-severe left Carpal Tunnel Syndrome. Needle examination shows acute/ongoing denervation in right lower-cervical paraspinal muscles which is consistent with cervical radiculopathy. The affected cervical root cannot be localized by paraspinal level but does raise the possibility of a double crush syndrome. Consider MRI of the cervical spine as clinically warranted  Notes from Dr. Trenton Gammon: Follow-up MRI scan of the patient's cervical spine demonstrates evidence of cervical laminectomy and resection of her intradural intramedullary tumor without obvious recurrent disease. The spinal cord itself appears to be healing well. Dr. Trenton Gammon states that the patient is permanently and totally disabled, this is likely permanent.    Interval history 06/19/2015: Cynthia Rosales is a 34 y.o. female here as a follow up. 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 She was discharged in February of 2016 with discharge diagnoses of tetrplegia, HTN, ependymoma, c4 spinal cord injury, DM2, postoperative ileus, morbid obesity, SSD. She  was admitted to rehabilitation in January 2016 for inpatient therapy. Notes state that at admission, patient  required minimal assistance with mobility and ADL tasks. She improved during rehabilitation and activity tolerance, balance, posture control as well as ability to compensate for deficits. She had improvement in functional use of her bilateral upper and lower extremities.   She has no feeling from her hips down, her legs are giving out on her with falls. Her legs are cold from the knees down, her feet are cold all the time. Her right side still has numbness. If she picks up a pen, she has pain in her arms. She feels burning and sharp pain in her right arm. Dr. Shann Medal that she has been told she has severe nerve damage. The lower part of her back hurts. She is in chronic pain. He right eye is still blurry. The right side of her face is numb and sometimes she can't feel her ear. The back of the neck hurts. Her legs are giving out. She is in bed 4-5 days at a time. Sometimes she can't get out of the bed. She can't walk. She is crying profusely in the office today.   Review of Systems: Patient complains of symptoms per HPI as well as the following symptoms: Double vision, loss of vision, leg swelling, restless leg, joint pain, back pain, muscle cramps, walking difficulty, neck pain, neck stiffness, dizziness, numbness, weakness. Pertinent negatives per HPI. All others negative.   Social History   Social History  . Marital Status: Single    Spouse Name: N/A  . Number of Children: 3  . Years of Education: 11   Occupational History  . Unemployed     Social History Main Topics  . Smoking status: Never Smoker   . Smokeless tobacco: Never Used     Comment: 03/27/2014 "smoked ~ 1 cigarette/day; quit in ~ 2013"  . Alcohol Use: 0.0 oz/week    0 Standard drinks or equivalent per week  . Drug Use: No  . Sexual Activity: Yes   Other Topics Concern  . Not on file   Social History Narrative   Patient lives at home with boyfriend.    Patient has 3 children    Patient does not work    Patient has an  11th grade education    Patient is right handed     Family History  Problem Relation Age of Onset  . Breast cancer Maternal Aunt   . Ovarian cancer Maternal Grandmother   . Breast cancer Maternal Aunt   . Migraines Neg Hx   . Diabetes Mother   . Hypertension Mother     Past Medical History  Diagnosis Date  . Hypertension   . Asthma   . Type II diabetes mellitus   . History of blood transfusion     "after I had one of my kids"  . Sickle cell disease   . Anemia   . Depression   . Ovarian cyst   . Renal disorder     kidney stones  . Abscess of bursa, left elbow 06/2013    Treated with I and D/antibiotics.     Past Surgical History  Procedure Laterality Date  . Cesarean section  2000; 2007; 2011  . Dilation and curettage of uterus    . Appendectomy  2013  . Reduction mammaplasty Bilateral 1998  . Laminectomy N/A 12/13/2014    Procedure: CERVICAL LAMINECTOMY FOR INTRADURAL TUMOR;  Surgeon: Charlie Pitter, MD;  Location: [facility]  NEURO ORS;  Service: Neurosurgery;  Laterality: N/A;  posterior    Current Outpatient Prescriptions  Medication Sig Dispense Refill  . diazepam (VALIUM) 5 MG tablet Take 1 tablet (5 mg total) by mouth every 8 (eight) hours as needed for muscle spasms. 30 tablet 1  . HUMULIN 70/30 KWIKPEN (70-30) 100 UNIT/ML PEN Inject 30-50 Units into the skin 2 (two) times daily. 30 units in the morning  50 units at night  6  . losartan (COZAAR) 25 MG tablet Take 25 mg by mouth daily.  2  . oxyCODONE-acetaminophen (PERCOCET/ROXICET) 5-325 MG per tablet Take 1 tablet by mouth every 6 (six) hours as needed for severe pain. (Patient not taking: Reported on 07/11/2015) 20 tablet 0   No current facility-administered medications for this visit.    Allergies as of 07/11/2015 - Review Complete 07/11/2015  Allergen Reaction Noted  . Morphine and related Itching and Nausea Only 07/11/2015  . Amoxicillin Hives and Rash 09/05/2013    Vitals: BP 162/91 mmHg  Pulse 105   Temp(Src) 98.3 F (36.8 C) (Oral)  Wt 337 lb 12.8 oz (153.225 kg) Last Weight:  Wt Readings from Last 1 Encounters:  07/11/15 337 lb 12.8 oz (153.225 kg)   Last Height:   Ht Readings from Last 1 Encounters:  06/19/15 5\' 3"  (1.6 m)   Physical exam: Exam: Gen: NAD, conversant, well nourised, obese, well groomed                     CV: RRR, no MRG. No Carotid Bruits. No peripheral edema, warm, nontender Eyes: Conjunctivae clear without exudates or hemorrhage  Neuro: Detailed Neurologic Exam  Speech:    Speech is normal; fluent and spontaneous with normal comprehension.  Cognition:    The patient is oriented to person, place, and time;       The pupils are equal, round, and reactive to light. Visual fields are full to finger confrontation. Extraocular movements are intact. Trigeminal sensation is intact and the muscles of mastication are normal. The face is symmetric. The palate elevates in the midline. Hearing intact. Voice is normal. Shoulder shrug is normal. The tongue has normal motion without fasciculations.   G ait:    Wide based, slow, keeps her knees locked to compensate for weakness  Motor Observation:    No asymmetry, no atrophy, and no involuntary movements noted. Tone:    Normal muscle tone.  No spasticity   Posture:    Posture is normal. normal erect    Strength:  Upper extremities 5/5 Bilateral Hip flexion 3-/5, Bilateral leg extension/flexion 3/5,  Right 0/5 DF/PF  Left 4+/5 DF/PF   otherwise strength is V/V in the upper and lower limbs.      Sensation: decreased sensation on the right arm and leg and thorx/abd/pelvis - right sided hemisensory loss     Reflex Exam:  DTR's: brisk biceps and brachioradialis, normal patellars (possibly slightly more brisk on the right patellar but difficult due to very large body habitus), absent AJs    Toes:    The toes are equivocal bilaterally.   Clonus:    Clonus is absent.       Assessment/Plan:   Patient is an  unfortunate 34 year old female with cervical laminectomy with resection of intradural intramedullary tumor ependymoma and c4 injury, paraparesis of both lower legs, incontinece of bowel and bladder, right arm pain. She is having significant chronic pain in the right arm which is likely also due to concomitant right severe  carpal tunnel syndrome that she did not have evaluated due to the ependymoma removal. We'll repeat EMG nerve conduction study. She also has low back pain and radicular symptoms in the legs that predate her spinal cord tumor resection. She is mornidly obese. She likely needs a pain management clinic as her case is complicated. She had rehabilitation with Dr. Naaman Plummer in the past and I highly recommend following up with him for ongoing rehabilitation. She has been seeing a Designer, [facility]ellery as her primary care but she requests a new referral for a family practice physician. At this time can increase the gabapentin for pain. We'll give her a one time oxycodone refill as the morphine is causing her side effects. Spoke with one of the nurses at her current clinic because she signed a pain contract there, and my writing a prescription will not affect her pain management at the family medicine clinic. But I do think she could benefit from a specialized pain clinic if possible.   Repeat emg/ncs Need pain clinic referral  Family Practice referral - patient has lower extremity swelling, morbid obesity,diabetes, Needs primary care management and regular follow up for these and her other diagnoses. She is currently seeing an NP at [facility] but she would like to be seen at a different office by an MD. Increase the gabapentin for pain She sees Juluis Mire NP on [address] [facility] but she is not in the office today to speak with me - Spoke with Sales executive. Can give patient a one-time prescription of oxycodone and this will not affect her pain contract at [facility].  F/u  with Dr. Trenton Gammon for ongoing surveillance and management of ependymoma My exam was not consistent with that done at neurosurgery in May which showed normal upper extremity muscle strength, and 4/5 lower extremity strength with sensory changes only at the C5 level. Unsure if patient's weakness today on exam is exaggerated or if her strength is worsening. They noted some spasticity in the lower extremities which was not appreciated on exam today. She needs continued rehabilitation and physical therapy.  [name], MD  [facility] [address] [place], [place] [address]  Phone [phone] Fax [phone]  A total of 70 minutes was spent face-to-face with this patient. Over half this time was spent on counseling patient on the c4 spinal cord injury, chronic pain, paraparesis, ependymoma diagnosis and different diagnostic and therapeutic options available.

## 2015-07-14 DIAGNOSIS — G8220 Paraplegia, unspecified: Secondary | ICD-10-CM | POA: Insufficient documentation

## 2015-07-31 ENCOUNTER — Encounter: Payer: Medicaid Other | Admitting: Neurology

## 2015-08-06 ENCOUNTER — Ambulatory Visit (INDEPENDENT_AMBULATORY_CARE_PROVIDER_SITE_OTHER): Payer: Self-pay | Admitting: Neurology

## 2015-08-06 ENCOUNTER — Ambulatory Visit (INDEPENDENT_AMBULATORY_CARE_PROVIDER_SITE_OTHER): Payer: Medicaid Other | Admitting: Neurology

## 2015-08-06 ENCOUNTER — Telehealth: Payer: Self-pay | Admitting: *Deleted

## 2015-08-06 DIAGNOSIS — Z0289 Encounter for other administrative examinations: Secondary | ICD-10-CM

## 2015-08-06 DIAGNOSIS — G5603 Carpal tunnel syndrome, bilateral upper limbs: Secondary | ICD-10-CM

## 2015-08-06 DIAGNOSIS — M5440 Lumbago with sciatica, unspecified side: Secondary | ICD-10-CM

## 2015-08-06 DIAGNOSIS — R296 Repeated falls: Secondary | ICD-10-CM

## 2015-08-06 NOTE — Telephone Encounter (Signed)
Called Dr. Naaman Plummer office and spoke with Corene Cornea to f/u on referral sent to their office on 8/17. They stated they are 2 months behind they just starting scheduling referrals from August last week. Pt should hear from them within the next week or two.

## 2015-08-06 NOTE — Patient Instructions (Signed)
Pain clinic: [phone] [facility] Physicians: primary care [phone]

## 2015-08-06 NOTE — Progress Notes (Signed)
  [facility]    Provider:  Dr Jaynee Eagles Referring Provider: [facility], [facility] Primary Care Physician:  [facility]  HPI:  Cynthia Rosales is a 34 y.o. female here as a referral for hand pain.   Summary:   Nerve Conduction Studies were performed on the bilateral upper extremities.  The right median APB motor nerve showed prolonged distal onset latency (6.4 ms, N<4.0). The right Median 2nd Digit sensory nerve showed prolonged distal peak latency (6.3 ms, N<3.9). F Wave studies indicate that the right Median F wave has delayed latency(35.0, N<26ms).  The left median APB motor nerve showed prolonged distal onset latency (4.3 ms, N<4.0). The left Median 2nd Digit sensory nerve showed prolonged distal peak latency (4.4 ms, N<3.9).  F Wave studies indicate that the left Median F wave has normal latency.  Bilateral Ulnar ADM motor nerves were within normal limits. F Wave studies indicate that the bilateral Ulnar F waves have normal latencies The bilateralUlnar 5th digit sensory nerves were within normal limits.  The right Peroneal and Left Peroneal motor nerves were within normal limits. F Wave studies indicate that the right Peroneal F wave has delayed latency(58.0, N<21ms). F Wave studies indicate that the left Peroneal F wave has delayed latency(57.0, N<27ms).  The right Tibial motor nerve showed reduced amplitude (0.41mV, N>3)  F Wave studies indicate that the right Tibial F wave has delayed latency(62.0, N<33ms). The left Tibial motor nerve showed reduced amplitude (0.1mV, N>3)  F Wave studies indicate that the left Tibial F wave has delayed latency(63.0, N<57ms).  The bilateral Superficial Peroneal and Sural sensory nerves were within normal limits.  EMG needle study of selected bilateral upper extremity muscles was performed. The following muscles were normal: Deltoid, Triceps, Pronator Teres, Opponens Pollicis, First Dorsal  Interosseous. Could not evaluate cervical paraspinals as those are unreliable after surgery.  Conclusion: This is an abnormal study.   1. There is electrophysiologic evidence of bilateral moderately-severe Right > Left Carpal Tunnel  Syndrome.  2. Low amplitude Tibial motor potentials and delayed Tibial and Peroneal F waves can be seen with L5/S1 radiculopathy. Recommend MRI of the lumbar spine.    [name], MD  [facility] [address] [place], [place] [address]  Phone [phone] Fax [phone]

## 2015-08-06 NOTE — Progress Notes (Signed)
See procedure note.

## 2015-08-09 ENCOUNTER — Other Ambulatory Visit: Payer: Self-pay | Admitting: Neurology

## 2015-08-10 NOTE — Procedures (Signed)
[facility]    Provider:  Dr Jaynee Eagles Referring Provider: [facility], [facility] Primary Care Physician:  [facility]  HPI:  Cynthia Rosales is a 34 y.o. female here as a referral for hand pain.   Summary:   Nerve Conduction Studies were performed on the bilateral upper extremities.  The right median APB motor nerve showed prolonged distal onset latency (6.4 ms, N<4.0). The right Median 2nd Digit sensory nerve showed prolonged distal peak latency (6.3 ms, N<3.9). F Wave studies indicate that the right Median F wave has delayed latency(35.0, N<2ms).  The left median APB motor nerve showed prolonged distal onset latency (4.3 ms, N<4.0). The left Median 2nd Digit sensory nerve showed prolonged distal peak latency (4.4 ms, N<3.9).  F Wave studies indicate that the left Median F wave has normal latency.  Bilateral Ulnar ADM motor nerves were within normal limits. F Wave studies indicate that the bilateral Ulnar F waves have normal latencies The bilateralUlnar 5th digit sensory nerves were within normal limits.  The right Peroneal and Left Peroneal motor nerves were within normal limits. F Wave studies indicate that the right Peroneal F wave has delayed latency(58.0, N<54ms). F Wave studies indicate that the left Peroneal F wave has delayed latency(57.0, N<25ms).  The right Tibial motor nerve showed reduced amplitude (0.71mV, N>3)  F Wave studies indicate that the right Tibial F wave has delayed latency(62.0, N<64ms). The left Tibial motor nerve showed reduced amplitude (0.63mV, N>3)  F Wave studies indicate that the left Tibial F wave has delayed latency(63.0, N<66ms).  The bilateral Superficial Peroneal and Sural sensory nerves were within normal limits.  EMG needle study of selected bilateral upper extremity muscles was performed. The following muscles were normal: Deltoid, Triceps, Pronator Teres, Opponens Pollicis, First Dorsal Interosseous.  Could not evaluate cervical paraspinals as those are unreliable after surgery.  Conclusion: This is an abnormal study.   1. There is electrophysiologic evidence of bilateral moderately-severe Right > Left Carpal Tunnel  Syndrome.  2. Low amplitude Tibial motor potentials and delayed Tibial and Peroneal F waves can be seen with L5/S1 radiculopathy. Recommend MRI of the lumbar spine.    [name], MD  [facility] [address] [place], [place] [address]  Phone [phone] Fax [phone]

## 2015-08-14 ENCOUNTER — Ambulatory Visit: Payer: Self-pay | Admitting: Neurosurgery

## 2015-08-18 ENCOUNTER — Inpatient Hospital Stay: Admission: RE | Admit: 2015-08-18 | Payer: Self-pay | Source: Ambulatory Visit

## 2015-08-23 ENCOUNTER — Encounter: Payer: Self-pay | Admitting: Physical Medicine & Rehabilitation

## 2015-08-23 ENCOUNTER — Encounter: Payer: Medicaid Other | Attending: Physical Medicine & Rehabilitation | Admitting: Physical Medicine & Rehabilitation

## 2015-08-23 VITALS — BP 159/87 | HR 113

## 2015-08-23 DIAGNOSIS — Z87442 Personal history of urinary calculi: Secondary | ICD-10-CM | POA: Diagnosis not present

## 2015-08-23 DIAGNOSIS — G5601 Carpal tunnel syndrome, right upper limb: Secondary | ICD-10-CM | POA: Diagnosis not present

## 2015-08-23 DIAGNOSIS — S14104S Unspecified injury at C4 level of cervical spinal cord, sequela: Secondary | ICD-10-CM | POA: Diagnosis not present

## 2015-08-23 DIAGNOSIS — F329 Major depressive disorder, single episode, unspecified: Secondary | ICD-10-CM | POA: Diagnosis not present

## 2015-08-23 DIAGNOSIS — C720 Malignant neoplasm of spinal cord: Secondary | ICD-10-CM

## 2015-08-23 DIAGNOSIS — G8250 Quadriplegia, unspecified: Secondary | ICD-10-CM

## 2015-08-23 DIAGNOSIS — N289 Disorder of kidney and ureter, unspecified: Secondary | ICD-10-CM | POA: Diagnosis not present

## 2015-08-23 DIAGNOSIS — E119 Type 2 diabetes mellitus without complications: Secondary | ICD-10-CM | POA: Diagnosis not present

## 2015-08-23 DIAGNOSIS — X58XXXS Exposure to other specified factors, sequela: Secondary | ICD-10-CM | POA: Insufficient documentation

## 2015-08-23 DIAGNOSIS — I10 Essential (primary) hypertension: Secondary | ICD-10-CM | POA: Insufficient documentation

## 2015-08-23 DIAGNOSIS — M5417 Radiculopathy, lumbosacral region: Secondary | ICD-10-CM | POA: Diagnosis not present

## 2015-08-23 DIAGNOSIS — K592 Neurogenic bowel, not elsewhere classified: Secondary | ICD-10-CM

## 2015-08-23 DIAGNOSIS — G5602 Carpal tunnel syndrome, left upper limb: Secondary | ICD-10-CM

## 2015-08-23 DIAGNOSIS — D571 Sickle-cell disease without crisis: Secondary | ICD-10-CM | POA: Insufficient documentation

## 2015-08-23 DIAGNOSIS — J45909 Unspecified asthma, uncomplicated: Secondary | ICD-10-CM | POA: Insufficient documentation

## 2015-08-23 DIAGNOSIS — G5603 Carpal tunnel syndrome, bilateral upper limbs: Secondary | ICD-10-CM | POA: Insufficient documentation

## 2015-08-23 DIAGNOSIS — N319 Neuromuscular dysfunction of bladder, unspecified: Secondary | ICD-10-CM

## 2015-08-23 MED ORDER — GABAPENTIN 300 MG PO CAPS: 300.0000 mg | ORAL_CAPSULE | Freq: Three times a day (TID) | ORAL | Status: DC

## 2015-08-23 MED ORDER — DIAZEPAM 5 MG PO TABS: 5.0000 mg | ORAL_TABLET | Freq: Two times a day (BID) | ORAL | Status: DC

## 2015-08-23 NOTE — Patient Instructions (Signed)
BOWELS:  DAILY SUPPOSITORY OR MINI-ENEMA----YOU CAN USE DIG STIM-----IT'S GONNA BE SLOW TO START------ESTABLISH A ROUTINE!!!!!  -PERFORM BOWEL PROGRAM IN MORNING AFTER BREAKFAST AND WHEN KIDS HAVE GONE TO SCHOOL   BLADDER: TIMED VOIDING (WHETHER YOU FEEL LIKE YOU NEED TO GO OR NOT) EVERY 2-3 HOURS WHILE YOUR AWAKE!!!

## 2015-08-23 NOTE — Progress Notes (Deleted)
   Subjective:    Patient ID: Cynthia Rosales, female    DOB: Apr 22, 1981, 34 y.o.   MRN: [id number]  HPI    Review of Systems     Objective:   Physical Exam        Assessment & Plan:

## 2015-08-23 NOTE — Progress Notes (Signed)
Subjective:    Patient ID: Cynthia Rosales, female    DOB: 1981/08/08, 34 y.o.   MRN: [id number]  HPI   Cynthia Rosales is here in follow up of her C4 ependymoma and associated tetraplegia. She has been seeing Dr. Annette Stable for follow up as well as Dr .Jaynee Eagles. 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 She tells me that CTS release is tentatively scheduled for the RUE.  She has struggled since coming home. For some reason she never followed up with me---Neither of [place] is sure why. She has 3 kids at home and finds it literally a struggle to manage the home let alone her one problems. She falls repeatedly. She is incontinent of bowel and bladder. She wears diapers to keep herself clean. She has never established a bowel or bladder program at home despite [place] working on that in rehab.   Currently her pain is severe, particularly in the RUE. The pain radiates from her upper shoulder/neck to her right hand. She also has pain on her right trunk and to a lesser extent in the right leg. Her primary is writing her oxycodone for now and has a CSA with her. The oxycodone helps to a degree. We had her previoulsy only baclofen and gabapentin at discharge but she states she stopped these because they didn't work. As i recall, her spasms and pain was not nearly as bad as they were when we had these meds on board. She is currently taking a valium once daily which seems to help her spasms.  She is trying to get more help at home, but has an Environmental consultant for only 3 hours per day. Her family all lives out of town in [place] and [place]     Pain Inventory Average Pain 9 Pain Right Now 9 My pain is sharp, burning, stabbing, tingling and aching  In the last 24 hours, has pain interfered with the following? General activity 8 Relation with others 0 Enjoyment of life 8 What TIME of day is your pain at its worst? all Sleep (in general) Poor  Pain  is worse with: walking and standing Pain improves with: medication Relief from Meds: 6  Mobility walk with assistance use a walker how many minutes can you walk? 3 ability to climb steps?  no do you drive?  no  Function disabled: date disabled 2011 I need assistance with the following:  dressing, bathing, household duties and shopping  Neuro/Psych bladder control problems weakness numbness tingling trouble walking  Prior Studies Any changes since last visit?  no  Physicians involved in your care Any changes since last visit?  no   Family History  Problem Relation Age of Onset  . Breast cancer Maternal Aunt   . Ovarian cancer Maternal Grandmother   . Breast cancer Maternal Aunt   . Migraines Neg Hx   . Diabetes Mother   . Hypertension Mother    Social History   Social History  . Marital Status: Single    Spouse Name: N/A  . Number of Children: 3  . Years of Education: 11   Occupational History  . Unemployed     Social History Main Topics  . Smoking status: Never Smoker   . Smokeless tobacco: Never Used     Comment: 03/27/2014 "smoked ~ 1 cigarette/day; quit in ~ 2013"  . Alcohol Use: 0.0 oz/week    0 Standard drinks or equivalent per week  . Drug  Use: No  . Sexual Activity: Yes   Other Topics Concern  . None   Social History Narrative   Patient lives at home with boyfriend.    Patient has 3 children    Patient does not work    Patient has an 11th grade education    Patient is right handed    Past Surgical History  Procedure Laterality Date  . Cesarean section  2000; 2007; 2011  . Dilation and curettage of uterus    . Appendectomy  2013  . Reduction mammaplasty Bilateral 1998  . Laminectomy N/A 12/13/2014    Procedure: CERVICAL LAMINECTOMY FOR INTRADURAL TUMOR;  Surgeon: Charlie Pitter, MD;  Location: [facility];  Service: Neurosurgery;  Laterality: N/A;  posterior   Past Medical History  Diagnosis Date  . Hypertension   . Asthma   . Type  II diabetes mellitus   . History of blood transfusion     "after I had one of my kids"  . Sickle cell disease   . Anemia   . Depression   . Ovarian cyst   . Renal disorder     kidney stones  . Abscess of bursa, left elbow 06/2013    Treated with I and D/antibiotics.    BP 159/87 mmHg  Pulse 113  Opioid Risk Score:   Fall Risk Score:  `1  Depression screen PHQ 2/9  Depression screen PHQ 2/9 08/23/2015  Decreased Interest 3  Down, Depressed, Hopeless 3  PHQ - 2 Score 6  Altered sleeping 2  Tired, decreased energy 3  Change in appetite 2  Feeling bad or failure about yourself  2  Trouble concentrating 0  Moving slowly or fidgety/restless 1  Suicidal thoughts 0  PHQ-9 Score 16     Review of Systems  Constitutional: Positive for diaphoresis.  Genitourinary:       Bladder control problems  Musculoskeletal: Positive for gait problem.  Neurological: Positive for weakness and numbness.       Tingling  All other systems reviewed and are negative.      Objective:   Physical Exam  Constitutional: She is oriented to person, place, and time. She appears well-developed and well-nourished.  Morbidly obese female. No distress.  HENT: oral mucosa moist  Head: Normocephalic and atraumatic.  Eyes: Conjunctivae are normal. Pupils are equal, round, and reactive to light.  Neck: Normal range of motion. Neck supple.  Cardiovascular: Normal rate and regular rhythm.  Respiratory: Effort normal and breath sounds normal. No respiratory distress. She has no wheezes.  GI: Soft. She exhibits no distension. Bowel sounds are scarce. There is no tenderness.  Musculoskeletal: She exhibits no edema or tenderness.  Neurological: She is alert and oriented to person, place, and time.  Speech clear. Follows basic commands without difficulty.cognitively intact.  Right upper ext 3-4/5 delt, tricep, bicep, wrist 3+,  hand intrinsics are 2+-3/5. This limb is largely affected by pain however  and I'm not sure how acurate those grades are.  LUE: 4+ to 5/5 prox to distal. Sensation is 1/2 RUE to LT and 1+/2 LUE to LT.  RLE: 1+ to 2-/5 HF and KE and 1/5 ankle. LLE 2+ to 3 hf, ke and 3/5 ankle. Sensation 1/2   RLE and 1+/2 LLE.  Has normal sensation at collar. Impaired sensation over trunk to varying degrees. DTR's are 3+ in all 4's. ? Resting tone 1/4 quads,hamstring, ?biceps trace r>l.  "OK" sign + right hand. RUE and upper trunk hypersensitive almost  allodynic to touch.  Skin: Skin is warm and dry. Surgical site clean with steristrips. Psych: pt is pleasant and cooperative.   Assessment/Plan: 1. 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 She continues to display substantial myelopathic signs on exam, particularly on the right side. Her problem is compounded by her size and DM, CTS. She is further inhibited by the ongoing neuropathic pain affecting her right > left which corresponds also to her injury. She also struggles with her neurogenic bowel and bladder. I am not sure why she didn't follow up here after discharge from rehab as this is typically someone I see aggressively after discharge. 2. Bilateral moderate to severe, R>L CTS---it appears that CTS release of the RUE would be appropriate. I would not rush into surgery for the LUE at this point. It is really unclear how many of her symptoms are related to the SCI however 3. Bilateral L5-S1 radiculopathy 3. Pain Management: oxycodone PER PRIMARY---HAS A CSA -valium for spasms---INCREASE TO 5MG  BID---ASKED THAT SHE CHECK WITH HER PRIMARY WHO HAS A CSA WITH HER AND ASK IF IT'S OK BEFORE SHE FILLS RX -resume gabapentin up to 300mg  tid for neuropathic pain -  4. Neurogenic Bowel and bladder.  -daily suppository for a set AM schedule. May need a mini enema also  -timed  voids every 2-3 hours while awake. ?ditropan trial  -consider urology consult  5.. DM type 2: per primary.  Follow up with me in about a month. Forty-five minutes of face to face patient care time were spent during this visit. All questions were encouraged and answered.

## 2015-08-26 NOTE — Progress Notes (Signed)
Thank you SO much!! Cynthia Rosales

## 2015-09-02 ENCOUNTER — Telehealth: Payer: Self-pay | Admitting: *Deleted

## 2015-09-02 NOTE — Telephone Encounter (Signed)
R/s pt to 10/13 at 8am per pt request. Offered appt with Cynthia Givens, Cynthia Rosales on 10/4 at 8am but pt declined because she wanted to speak with Cynthia Rosales about upcoming surgery and had questions about it. Pt is going to move forward with surgery on Thursday, but would like to still f/u with Cynthia Rosales after surgery. She would like Cynthia Rosales to call her before her surgery this Thursday. I told her I will try to contact her, but she will not be back in the office until Wednesday due to a family emergency. She verbalized understanding.

## 2015-09-03 ENCOUNTER — Encounter (INDEPENDENT_AMBULATORY_CARE_PROVIDER_SITE_OTHER): Payer: Medicaid Other | Admitting: Diagnostic Neuroimaging

## 2015-09-03 ENCOUNTER — Ambulatory Visit
Admission: RE | Admit: 2015-09-03 | Discharge: 2015-09-03 | Disposition: A | Discharge status: Home / Self Care | Payer: Medicaid Other | Source: Ambulatory Visit | Attending: Neurology | Admitting: Neurology

## 2015-09-03 ENCOUNTER — Ambulatory Visit: Payer: Medicaid Other | Admitting: Neurology

## 2015-09-03 DIAGNOSIS — M5440 Lumbago with sciatica, unspecified side: Secondary | ICD-10-CM | POA: Diagnosis not present

## 2015-09-03 DIAGNOSIS — R296 Repeated falls: Secondary | ICD-10-CM

## 2015-09-05 ENCOUNTER — Telehealth: Payer: Self-pay | Admitting: *Deleted

## 2015-09-05 NOTE — Telephone Encounter (Signed)
LVM for pt to call back about results. Gave [facility] phone number. Advised we are open until 5pm.

## 2015-09-05 NOTE — Telephone Encounter (Signed)
-----   Message from Melvenia Beam, MD sent at 09/05/2015 12:07 PM EDT ----- Let patient the MRI of her lumbar spine showed some mild arthritis but no pinched nerve. We can discuss at next appointment.  thanks

## 2015-09-06 NOTE — Telephone Encounter (Signed)
Pt called office. Cynthia Rosales stated Cynthia Rosales just had surgery yesterday on her had and is in a lot of pain. Advised Cynthia Rosales should call Dr. Trenton Gammon office since he was the one who did her surgery to address the pain and see if they could switch pain medication. Also advised per Dr. Jaynee Eagles that her MRI lumbar spine showed some mild arthritis but no pinched nerve. Cynthia Rosales can talk with Dr. Jaynee Eagles about this more at her upcoming appt. Cynthia Rosales verbalized understanding.

## 2015-09-06 NOTE — Telephone Encounter (Signed)
-----   Message from Melvenia Beam, MD sent at 09/05/2015 12:07 PM EDT ----- Let patient the MRI of her lumbar spine showed some mild arthritis but no pinched nerve. We can discuss at next appointment.  thanks

## 2015-09-12 ENCOUNTER — Ambulatory Visit (INDEPENDENT_AMBULATORY_CARE_PROVIDER_SITE_OTHER): Payer: Medicaid Other | Admitting: Neurology

## 2015-09-12 ENCOUNTER — Encounter: Payer: Self-pay | Admitting: Neurology

## 2015-09-12 VITALS — BP 145/89 | HR 101 | Ht 63.0 in | Wt 339.4 lb

## 2015-09-12 DIAGNOSIS — G8250 Quadriplegia, unspecified: Secondary | ICD-10-CM

## 2015-09-12 DIAGNOSIS — C720 Malignant neoplasm of spinal cord: Secondary | ICD-10-CM | POA: Diagnosis not present

## 2015-09-12 DIAGNOSIS — M47817 Spondylosis without myelopathy or radiculopathy, lumbosacral region: Secondary | ICD-10-CM | POA: Diagnosis not present

## 2015-09-12 DIAGNOSIS — S14109S Unspecified injury at unspecified level of cervical spinal cord, sequela: Secondary | ICD-10-CM

## 2015-09-12 MED ORDER — DULOXETINE HCL 60 MG PO CPEP: 60.0000 mg | ORAL_CAPSULE | Freq: Every day | ORAL | Status: DC

## 2015-09-12 NOTE — Progress Notes (Signed)
[facility]    Provider:  Dr Jaynee Eagles Referring Provider: [facility], [facility] Primary Care Physician:  [facility]  CC: Numbness, weakness,   Interval history 09/12/2015:  She had Carpal Tunnel surgery this past Thursday.She has noticed a decrease in pain since then. She is getting pain management from [facility] where her PCP is located. She has finctional deficits secondary to ependymoma with C4 cord compression and incomplete C4-C5 tetraplegia s/p decompression. Gabapentin made her sick. Tried Lyrica and di not help She wants to lose weight. Her morbid obesity is contributing to her functional deficits. Her low back really hurts. The back hurts when she stands, she points to the L5/S1 levels and there is tenderness to palpation there. Will increase Cymbalta to 60mg  daily. Will refer to nutritionist. Discussed bariatric surgery. Increase valium as prescribed by Dr, Naaman Plummer. Will refer to pain management for facet blocks L5-S1. F/u with Dr. Naaman Plummer, called his office. Also needs f/u with Dr. Trenton Gammon s/p CTS surgery.  MRI lumbar spine:  L1-2: no spinal stenosis or foraminal narrowing  L2-3: no spinal stenosis or foraminal narrowing  L3-4: facet hypertrophy with no spinal stenosis or foraminal narrowing  L4-5: facet hypertrophy with no spinal stenosis or foraminal narrowing  L5-S1: facet hypertrophy with no spinal stenosis or foraminal narrowing   Limited views of the aorta, kidneys, iliopsoas muscles and sacroiliac joints are unremarkable. Significant subcutaneous fatty tissue.   IMPRESSION:  Unremarkable MRI lumbar spine (without). No spinal stenosis or foraminal narrowing.    Interval history 07/11/2015: Patient is an unfortunate 34 year old female with cervical laminectomy with resection of intradural intramedullary tumor ependymoma and subsequent cervical spine injury at C4. Dr. Trenton Gammon recommended complete and total disability and they  declined her. She is incontinent, she wears pampers. She doesn't know when she is urinating or defecating on herself. She is embarassed, she cries. She can't stand without using her arms. She has swelling in the legs and feels like she is walking on a balloon. She uses a walker. She has to keep her knees locked to walk. She has no balance. It hurts from the hips to the toes. She has numbness and tingling in her arms. Her whole right arm is numb, the fingertips are cold and numb. She has hand weakness and can't make a fist due to weakness. It is burning and sharp with pain. Her legs are totally stiff. She can't lift up her legs, totally stiff. She has bilateral CTS which was put off since they found the tumor. She has sharp pain in the hips and lower back. She has pain shooting in her hips. Sharp radiating pains. She is on oxycodone and may be on pain medication chronically. She has been seeing an NP but doesn' t have a primary care doctor. She is itching a lot from the morphine. She tried Lyrica and it didn't work, made her stomach hurt. Neurontin 300mg  not helping. No side effects from the gabapentin. EMG in the past showed severe carpal tunnel syndrome on the right and moderately severe on the left.  MRI cervical spine 04/15/2015;  Interval laminectomy for tumor resection of an ependymoma extending from C3-4 to C5. Slight enlargement of the cord diameter at the level of surgery, compared to the normal cord diameter. Very low level enhancement at the previous site of the tumor. The study is ndeterminate for postoperative change versus residual/recurrent tumor. Follow-up study in 2-3 months is suggested.  :  Abnormal MRI cervical spine  12/05/2014 (with and without) demonstrating: 1. There is intradural intramedullary cystic, expansile lesion within the spinal cord at C4-C5 levels measuring 0.9x0.8x2.2cm (APxtransxSI) with internal webs/septations. Concerning for neoplastic process. Infectious, vascular or  traumatic syringomyelia etiologies less likely. Consider serial imaging with post-contrast sequences for further evaluation. 2. Remainder of of spinal cord unremarkable.  EMG/NCS 09/25/2014: There is electrophysiologic evidence for a severe right Carpal Tunnel Syndrome and a moderately-severe left Carpal Tunnel Syndrome. Needle examination shows acute/ongoing denervation in right lower-cervical paraspinal muscles which is consistent with cervical radiculopathy. The affected cervical root cannot be localized by paraspinal level but does raise the possibility of a double crush syndrome. Consider MRI of the cervical spine as clinically warranted  Notes from Dr. Trenton Gammon: Follow-up MRI scan of the patient's cervical spine demonstrates evidence of cervical laminectomy and resection of her intradural intramedullary tumor without obvious recurrent disease. The spinal cord itself appears to be healing well. Dr. Trenton Gammon states that the patient is permanently and totally disabled, this is likely permanent.    Interval history 06/19/2015: Cynthia Rosales is a 34 y.o. female here as a follow up. 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 She was discharged in February of 2016 with discharge diagnoses of tetrplegia, HTN, ependymoma, c4 spinal cord injury, DM2, postoperative ileus, morbid obesity, SSD. She was admitted to rehabilitation in January 2016 for inpatient therapy. Notes state that at admission, patient required minimal assistance with mobility and ADL tasks. She improved during rehabilitation and activity tolerance, balance, posture control as well as ability to compensate for deficits. She had improvement in functional use of her bilateral upper and lower extremities.   She has no feeling from her hips down, her legs are giving out on her with falls. Her legs are cold from the knees down, her feet are cold all the time. Her right side still has  numbness. If she picks up a pen, she has pain in her arms. She feels burning and sharp pain in her right arm. Dr. Shann Medal that she has been told she has severe nerve damage. The lower part of her back hurts. She is in chronic pain. He right eye is still blurry. The right side of her face is numb and sometimes she can't feel her ear. The back of the neck hurts. Her legs are giving out. She is in bed 4-5 days at a time. Sometimes she can't get out of the bed. She can't walk. She is crying profusely in the office today.   Review of Systems: Patient complains of symptoms per HPI as well as the following symptoms: no CP, no SOB. Pertinent negatives per HPI. All others negative.   Social History   Social History  . Marital Status: Single    Spouse Name: N/A  . Number of Children: 3  . Years of Education: 11   Occupational History  . Unemployed     Social History Main Topics  . Smoking status: Never Smoker   . Smokeless tobacco: Never Used     Comment: 03/27/2014 "smoked ~ 1 cigarette/day; quit in ~ 2013"  . Alcohol Use: 0.0 oz/week    0 Standard drinks or equivalent per week  . Drug Use: No  . Sexual Activity: Yes   Other Topics Concern  . Not on file   Social History Narrative   Patient lives at home with boyfriend.    Patient has 3 children    Patient does  not work    Patient has an 11th grade education    Patient is right handed     Family History  Problem Relation Age of Onset  . Breast cancer Maternal Aunt   . Ovarian cancer Maternal Grandmother   . Breast cancer Maternal Aunt   . Migraines Neg Hx   . Diabetes Mother   . Hypertension Mother     Past Medical History  Diagnosis Date  . Hypertension   . Asthma   . Type II diabetes mellitus   . History of blood transfusion     "after I had one of my kids"  . Sickle cell disease   . Anemia   . Depression   . Ovarian cyst   . Renal disorder     kidney stones  . Abscess of bursa, left elbow 06/2013    Treated with  I and D/antibiotics.     Past Surgical History  Procedure Laterality Date  . Cesarean section  2000; 2007; 2011  . Dilation and curettage of uterus    . Appendectomy  2013  . Reduction mammaplasty Bilateral 1998  . Laminectomy N/A 12/13/2014    Procedure: CERVICAL LAMINECTOMY FOR INTRADURAL TUMOR;  Surgeon: Charlie Pitter, MD;  Location: [facility];  Service: Neurosurgery;  Laterality: N/A;  posterior    Current Outpatient Prescriptions  Medication Sig Dispense Refill  . diazepam (VALIUM) 5 MG tablet Take 1 tablet (5 mg total) by mouth 2 (two) times daily. 60 tablet 2  . DULoxetine (CYMBALTA) 30 MG capsule Take 1 capsule (30 mg total) by mouth daily. 30 capsule 6  . gabapentin (NEURONTIN) 300 MG capsule Take 1 capsule (300 mg total) by mouth 3 (three) times daily. 90 capsule 3  . HUMULIN 70/30 KWIKPEN (70-30) 100 UNIT/ML PEN Inject 30-50 Units into the skin 2 (two) times daily. 30 units in the morning  50 units at night  6  . hydrochlorothiazide (HYDRODIURIL) 25 MG tablet Take 25 mg by mouth daily.  3  . losartan (COZAAR) 25 MG tablet Take 25 mg by mouth daily.  2  . methylPREDNISolone (MEDROL DOSEPAK) 4 MG TBPK tablet follow package directions 21 tablet 0  . oxyCODONE (ROXICODONE) 15 MG immediate release tablet Take 1 tablet (15 mg total) by mouth every 6 (six) hours as needed for pain. 120 tablet 0   No current facility-administered medications for this visit.    Allergies as of 09/12/2015 - Review Complete 09/12/2015  Allergen Reaction Noted  . Morphine and related Itching and Nausea Only 07/11/2015  . Amoxicillin Hives and Rash 09/05/2013    Vitals: BP 145/89 mmHg  Pulse 101  Ht 5\' 3"  (1.6 m)  Wt 339 lb 6.4 oz (153.951 kg)  BMI 60.14 kg/m2 Last Weight:  Wt Readings from Last 1 Encounters:  09/12/15 339 lb 6.4 oz (153.951 kg)   Last Height:   Ht Readings from Last 1 Encounters:  09/12/15 5\' 3"  (1.6 m)       Physical exam: Exam: Gen: NAD, conversant, well  nourised, obese, well groomed  CV: RRR, no MRG. No Carotid Bruits. No peripheral edema, warm, nontender Eyes: Conjunctivae clear without exudates or hemorrhage  Neuro: Detailed Neurologic Exam  Speech:  Speech is normal; fluent and spontaneous with normal comprehension.  Cognition:  The patient is oriented to person, place, and time;   The pupils are equal, round, and reactive to light. Visual fields are full to finger confrontation. Extraocular movements are intact. Trigeminal sensation is  intact and the muscles of mastication are normal. The face is symmetric. The palate elevates in the midline. Hearing intact. Voice is normal. Shoulder shrug is normal. The tongue has normal motion without fasciculations.   G ait:  Wide based, slow, keeps her knees locked to compensate for weakness  Motor Observation:  No asymmetry, no atrophy, and no involuntary movements noted. Tone:  Normal muscle tone. No spasticity   Posture:  Posture is normal while sitting   Strength:  Right UE extremities with giveway due to pain, unreliable exam today s/p CTS LUE with some mild prox weakness RLE Hip flexion and knee extension/flexion 2/5, Right 1/5 DF/PF  Left Hip flexion, knee extension and flexion 3-/5 Left 4/5 DF/PF    Sensation: decreased sensation on the right arm and leg and thorx/abd/pelvis - right sided hemisensory loss   Reflex Exam:  DTR's: brisk biceps and brachioradialis, normal patellars (possibly slightly more brisk on the right patellar but difficult due to very large body habitus), absent AJs   Toes:  The toes are equivocal bilaterally.  Clonus:  Clonus is absent.      Assessment/Plan: Patient is an unfortunate 34 year old female with cervical laminectomy with resection of intradural intramedullary tumor ependymoma and c4 injury, tetraparesis, incontinece of bowel and bladder, right arm pain. She has finctional  deficits secondary to ependymoma with C4 cord compression and incomplete C4-C5 tetraplegia s/p decompression. She is having significant chronic pain in the right arm which is likely also due to concomitant right severe carpal tunnel syndrome s/p CTS release.  She also has low back pain and radicular symptoms in the legs that predate her spinal cord tumor resection. She is mornidly obese.  I highly recommend following up with Dr. Naaman Plummer for ongoing rehabilitation.    Will increase Cymbalta to 60mg  daily.  Will refer to nutritionist. Discussed bariatric surgery.  Increase valium as prescribed by Dr, Naaman Plummer.  Will refer to pain management for facet blocks L5-S1.  F/u with Dr. Naaman Plummer, called his office and they will contact patient.  Also needs f/u with Dr. Trenton Gammon s/p CTS surgery. F/u with Dr. Trenton Gammon for ongoing surveillance and management of ependymoma Pain management per primary care Neurogenic bowel and bladder management per Dr. Naaman Plummer: daily suppository for a set AM schedule. May need a mini enema also, timed voids every 2-3 hours while awake.  [name], MD  [facility] [address] [place], [place] [address]  Phone [phone] Fax [phone]  A total of 30 minutes was spent face-to-face with this patient. Over half this time was spent on counseling patient on the c4 spinal cord injury, chronic pain, teraparesis, ependymoma diagnosis and different diagnostic and therapeutic options available.

## 2015-09-12 NOTE — Patient Instructions (Addendum)
Overall you are doing fairly well but I do want to suggest a few things today:   Remember to drink plenty of fluid, eat healthy meals and do not skip any meals. Try to eat protein with a every meal and eat a healthy snack such as fruit or nuts in between meals. Try to keep a regular sleep-wake schedule and try to exercise daily, particularly in the form of walking, 20-30 minutes a day, if you can.   As far as your medications are concerned, I would like to suggest: Increase cymbalta to 60mg  daily. Increase valium per Dr. Naaman Plummer. Follow up with Dr. Naaman Plummer in one month.   Will refer to Dietician as well as pain management for low back injections. Discussed bariatric surgery.  I would like to see you back in 3 months, sooner if we need to. Please call us with any interim questions, concerns, problems, updates or refill requests.   Our phone number is [phone]. We also have an after hours call service for urgent matters and there is a physician on-call for urgent questions. For any emergencies you know to call 911 or go to the nearest emergency room

## 2015-09-16 ENCOUNTER — Other Ambulatory Visit: Payer: Self-pay | Admitting: *Deleted

## 2015-09-16 ENCOUNTER — Ambulatory Visit: Payer: Medicaid Other | Admitting: Rehabilitative and Restorative Service Providers"

## 2015-09-16 DIAGNOSIS — M47817 Spondylosis without myelopathy or radiculopathy, lumbosacral region: Secondary | ICD-10-CM

## 2015-09-25 ENCOUNTER — Ambulatory Visit: Payer: Medicaid Other | Admitting: Physical Therapy

## 2015-09-25 ENCOUNTER — Encounter (HOSPITAL_COMMUNITY): Payer: Self-pay | Admitting: Emergency Medicine

## 2015-09-25 ENCOUNTER — Emergency Department (HOSPITAL_COMMUNITY)
Admission: EM | Admit: 2015-09-25 | Discharge: 2015-09-25 | Disposition: A | Discharge status: Home / Self Care | Payer: Medicaid Other | Attending: Emergency Medicine | Admitting: Emergency Medicine

## 2015-09-25 ENCOUNTER — Emergency Department (HOSPITAL_COMMUNITY): Payer: Medicaid Other

## 2015-09-25 DIAGNOSIS — Z862 Personal history of diseases of the blood and blood-forming organs and certain disorders involving the immune mechanism: Secondary | ICD-10-CM | POA: Diagnosis not present

## 2015-09-25 DIAGNOSIS — E119 Type 2 diabetes mellitus without complications: Secondary | ICD-10-CM | POA: Diagnosis not present

## 2015-09-25 DIAGNOSIS — Z79899 Other long term (current) drug therapy: Secondary | ICD-10-CM | POA: Diagnosis not present

## 2015-09-25 DIAGNOSIS — M542 Cervicalgia: Secondary | ICD-10-CM

## 2015-09-25 DIAGNOSIS — I10 Essential (primary) hypertension: Secondary | ICD-10-CM | POA: Diagnosis not present

## 2015-09-25 DIAGNOSIS — Z880 Allergy status to penicillin: Secondary | ICD-10-CM | POA: Diagnosis not present

## 2015-09-25 DIAGNOSIS — E669 Obesity, unspecified: Secondary | ICD-10-CM | POA: Diagnosis not present

## 2015-09-25 DIAGNOSIS — F329 Major depressive disorder, single episode, unspecified: Secondary | ICD-10-CM | POA: Diagnosis not present

## 2015-09-25 DIAGNOSIS — J45909 Unspecified asthma, uncomplicated: Secondary | ICD-10-CM | POA: Diagnosis not present

## 2015-09-25 DIAGNOSIS — Y9389 Activity, other specified: Secondary | ICD-10-CM | POA: Insufficient documentation

## 2015-09-25 DIAGNOSIS — W050XXA Fall from non-moving wheelchair, initial encounter: Secondary | ICD-10-CM

## 2015-09-25 DIAGNOSIS — S199XXA Unspecified injury of neck, initial encounter: Secondary | ICD-10-CM | POA: Insufficient documentation

## 2015-09-25 DIAGNOSIS — S7002XA Contusion of left hip, initial encounter: Secondary | ICD-10-CM | POA: Diagnosis not present

## 2015-09-25 DIAGNOSIS — Y92099 Unspecified place in other non-institutional residence as the place of occurrence of the external cause: Secondary | ICD-10-CM | POA: Insufficient documentation

## 2015-09-25 DIAGNOSIS — Z794 Long term (current) use of insulin: Secondary | ICD-10-CM | POA: Diagnosis not present

## 2015-09-25 DIAGNOSIS — Z8742 Personal history of other diseases of the female genital tract: Secondary | ICD-10-CM | POA: Diagnosis not present

## 2015-09-25 DIAGNOSIS — S8002XA Contusion of left knee, initial encounter: Secondary | ICD-10-CM

## 2015-09-25 DIAGNOSIS — Z87448 Personal history of other diseases of urinary system: Secondary | ICD-10-CM | POA: Insufficient documentation

## 2015-09-25 DIAGNOSIS — S20229A Contusion of unspecified back wall of thorax, initial encounter: Secondary | ICD-10-CM

## 2015-09-25 DIAGNOSIS — Y998 Other external cause status: Secondary | ICD-10-CM | POA: Diagnosis not present

## 2015-09-25 DIAGNOSIS — S79912A Unspecified injury of left hip, initial encounter: Secondary | ICD-10-CM | POA: Diagnosis present

## 2015-09-25 MED ORDER — OXYCODONE-ACETAMINOPHEN 5-325 MG PO TABS
ORAL_TABLET | ORAL | Status: AC
  Filled 2015-09-25: qty 1

## 2015-09-25 MED ORDER — HYDROMORPHONE HCL 2 MG PO TABS
2.0000 mg | ORAL_TABLET | Freq: Once | ORAL | Status: AC
  Administered 2015-09-25: 2 mg via ORAL
  Filled 2015-09-25: qty 1

## 2015-09-25 MED ORDER — OXYCODONE-ACETAMINOPHEN 5-325 MG PO TABS
1.0000 | ORAL_TABLET | Freq: Once | ORAL | Status: AC
  Administered 2015-09-25: 1 via ORAL

## 2015-09-25 NOTE — Discharge Instructions (Signed)
Contusion A contusion is a deep bruise. Contusions are the result of a blunt injury to tissues and muscle fibers under the skin. The injury causes bleeding under the skin. The skin overlying the contusion may turn blue, purple, or yellow. Minor injuries will give you a painless contusion, but more severe contusions may stay painful and swollen for a few weeks.  CAUSES  This condition is usually caused by a blow, trauma, or direct force to an area of the body. SYMPTOMS  Symptoms of this condition include:  Swelling of the injured area.  Pain and tenderness in the injured area.  Discoloration. The area may have redness and then turn blue, purple, or yellow. DIAGNOSIS  This condition is diagnosed based on a physical exam and medical history. An X-ray, CT scan, or MRI may be needed to determine if there are any associated injuries, such as broken bones (fractures). TREATMENT  Specific treatment for this condition depends on what area of the body was injured. In general, the best treatment for a contusion is resting, icing, applying pressure to (compression), and elevating the injured area. This is often called the RICE strategy. Over-the-counter anti-inflammatory medicines may also be recommended for pain control.  HOME CARE INSTRUCTIONS   Rest the injured area.  If directed, apply ice to the injured area:  Put ice in a plastic bag.  Place a towel between your skin and the bag.  Leave the ice on for 20 minutes, 2-3 times per day.  If directed, apply light compression to the injured area using an elastic bandage. Make sure the bandage is not wrapped too tightly. Remove and reapply the bandage as directed by your health care provider.  If possible, raise (elevate) the injured area above the level of your heart while you are sitting or lying down.  Take over-the-counter and prescription medicines only as told by your health care provider. SEEK MEDICAL CARE IF:  Your symptoms do not  improve after several days of treatment.  Your symptoms get worse.  You have difficulty moving the injured area. SEEK IMMEDIATE MEDICAL CARE IF:   You have severe pain.  You have numbness in a hand or foot.  Your hand or foot turns pale or cold.   This information is not intended to replace advice given to you by your health care provider. Make sure you discuss any questions you have with your health care provider.   Document Released: 08/26/2005 Document Revised: 08/07/2015 Document Reviewed: 04/03/2015 Elsevier Interactive Patient Education 2016 [employer] Fall Prevention in the Home  Falls can cause injuries and can affect people from all age groups. There are many simple things that you can do to make your home safe and to help prevent falls. WHAT CAN I DO ON THE OUTSIDE OF MY HOME?  Regularly repair the edges of walkways and driveways and fix any cracks.  Remove high doorway thresholds.  Trim any shrubbery on the main path into your home.  Use bright outdoor lighting.  Clear walkways of debris and clutter, including tools and rocks.  Regularly check that handrails are securely fastened and in good repair. Both sides of any steps should have handrails.  Install guardrails along the edges of any raised decks or porches.  Have leaves, snow, and ice cleared regularly.  Use sand or salt on walkways during winter months.  In the garage, clean up any spills right away, including grease or oil spills. WHAT CAN I DO IN THE BATHROOM?  Use night lights.    Install grab bars by the toilet and in the tub and shower. Do not use towel bars as grab bars.  Use non-skid mats or decals on the floor of the tub or shower.  If you need to sit down while you are in the shower, use a plastic, non-slip stool..  Keep the floor dry. Immediately clean up any water that spills on the floor.  Remove soap buildup in the tub or shower on a regular basis.  Attach bath mats securely with  double-sided non-slip rug tape.  Remove throw rugs and other tripping hazards from the floor. WHAT CAN I DO IN THE BEDROOM?  Use night lights.  Make sure that a bedside light is easy to reach.  Do not use oversized bedding that drapes onto the floor.  Have a firm chair that has side arms to use for getting dressed.  Remove throw rugs and other tripping hazards from the floor. WHAT CAN I DO IN THE KITCHEN?   Clean up any spills right away.  Avoid walking on wet floors.  Place frequently used items in easy-to-reach places.  If you need to reach for something above you, use a sturdy step stool that has a grab bar.  Keep electrical cables out of the way.  Do not use floor polish or wax that makes floors slippery. If you have to use wax, make sure that it is non-skid floor wax.  Remove throw rugs and other tripping hazards from the floor. WHAT CAN I DO IN THE STAIRWAYS?  Do not leave any items on the stairs.  Make sure that there are handrails on both sides of the stairs. Fix handrails that are broken or loose. Make sure that handrails are as long as the stairways.  Check any carpeting to make sure that it is firmly attached to the stairs. Fix any carpet that is loose or worn.  Avoid having throw rugs at the top or bottom of stairways, or secure the rugs with carpet tape to prevent them from moving.  Make sure that you have a light switch at the top of the stairs and the bottom of the stairs. If you do not have them, have them installed. WHAT ARE SOME OTHER FALL PREVENTION TIPS?  Wear closed-toe shoes that fit well and support your feet. Wear shoes that have rubber soles or low heels.  When you use a stepladder, make sure that it is completely opened and that the sides are firmly locked. Have someone hold the ladder while you are using it. Do not climb a closed stepladder.  Add color or contrast paint or tape to grab bars and handrails in your home. Place contrasting color  strips on the first and last steps.  Use mobility aids as needed, such as canes, walkers, scooters, and crutches.  Turn on lights if it is dark. Replace any light bulbs that burn out.  Set up furniture so that there are clear paths. Keep the furniture in the same spot.  Fix any uneven floor surfaces.  Choose a carpet design that does not hide the edge of steps of a stairway.  Be aware of any and all pets.  Review your medicines with your healthcare provider. Some medicines can cause dizziness or changes in blood pressure, which increase your risk of falling. Talk with your health care provider about other ways that you can decrease your risk of falls. This may include working with a physical therapist or trainer to improve your strength, balance, and endurance.     This information is not intended to replace advice given to you by your health care provider. Make sure you discuss any questions you have with your health care provider.   Document Released: 11/06/2002 Document Revised: 04/02/2015 Document Reviewed: 12/21/2014 Elsevier Interactive Patient Education 2016 [employer]  

## 2015-09-25 NOTE — ED Notes (Addendum)
Pt. lost her balance and fell while trying to get up from her wheelchair at home this evening , denies LOC , reports pain at back of neck , pain at entire back and left hip pain . History of spinal cord injury / right carpal tunnel surgery.

## 2015-09-25 NOTE — ED Provider Notes (Signed)
CSN: [id number]     Arrival date & time 09/25/15  0011 History   By signing my name below, I, Cynthia Rosales, attest that this documentation has been prepared under the direction and in the presence of Delora Fuel, MD.  Electronically Signed: Forrestine Rosales, ED Scribe. 09/25/2015. 12:41 AM.   Chief Complaint  Patient presents with  . Fall   The history is provided by the patient. No language interpreter was used.    HPI Comments: Cynthia Rosales is a 34 y.o. female with a PMHx of HTN, DM, sickle cell disease, and spinal cord injurywho presents to the Emergency Department here after a fall sustained just prior to arrival. Pt states she lost her balance and fell landing on her L side while trying to get up from her wheelchair at home. Pt states this is her 6th time falling. No head trauma or LOC. She now c/o constant, ongoing back pain, L hip pain, and neck pain. Pain is rated 11/10 at this time. Prescribed Percocet 15 and Valium attempted at home without any improvement for discomfort. Ms. Schou is currently established with a pain clinic in town. No recent fever, chills, nausea, vomiting, shortness of breath, or chest pain.  PCP: [facility]     Past Medical History  Diagnosis Date  . Hypertension   . Asthma   . Type II diabetes mellitus ([place])   . History of blood transfusion     "after I had one of my kids"  . Sickle cell disease ([place])   . Anemia   . Depression   . Ovarian cyst   . Renal disorder     kidney stones  . Abscess of bursa, left elbow 06/2013    Treated with I and D/antibiotics.    Past Surgical History  Procedure Laterality Date  . Cesarean section  2000; 2007; 2011  . Dilation and curettage of uterus    . Appendectomy  2013  . Reduction mammaplasty Bilateral 1998  . Laminectomy N/A 12/13/2014    Procedure: CERVICAL LAMINECTOMY FOR INTRADURAL TUMOR;  Surgeon: Charlie Pitter, MD;  Location: [facility];  Service: Neurosurgery;  Laterality: N/A;   posterior  . Carpal tunnel release     Family History  Problem Relation Age of Onset  . Breast cancer Maternal Aunt   . Ovarian cancer Maternal Grandmother   . Breast cancer Maternal Aunt   . Migraines Neg Hx   . Diabetes Mother   . Hypertension Mother    Social History  Substance Use Topics  . Smoking status: Never Smoker   . Smokeless tobacco: Never Used     Comment: 03/27/2014 "smoked ~ 1 cigarette/day; quit in ~ 2013"  . Alcohol Use: 0.0 oz/week    0 Standard drinks or equivalent per week   OB History    Gravida Para Term Preterm AB TAB SAB Ectopic Multiple Living   1              Review of Systems  Constitutional: Negative for fever and chills.  Respiratory: Negative for cough and shortness of breath.   Cardiovascular: Negative for chest pain and leg swelling.  Gastrointestinal: Negative for nausea, vomiting and abdominal pain.  Musculoskeletal: Positive for back pain, arthralgias and neck pain.  Skin: Negative for rash.  Neurological: Negative for headaches.  Psychiatric/Behavioral: Negative for confusion.  All other systems reviewed and are negative.     Allergies  Morphine and related and Amoxicillin  Home Medications  Prior to Admission medications   Medication Sig Start Date End Date Taking? Authorizing Provider  diazepam (VALIUM) 5 MG tablet Take 1 tablet (5 mg total) by mouth 2 (two) times daily. 08/23/15   Meredith Staggers, MD  DULoxetine (CYMBALTA) 60 MG capsule Take 1 capsule (60 mg total) by mouth daily. 09/12/15   Melvenia Beam, MD  HUMULIN 70/30 KWIKPEN (70-30) 100 UNIT/ML PEN Inject 30-50 Units into the skin 2 (two) times daily. 30 units in the morning  50 units at night 03/28/15   Historical Provider, MD  hydrochlorothiazide (HYDRODIURIL) 25 MG tablet Take 25 mg by mouth daily. 08/13/15   Historical Provider, MD  losartan (COZAAR) 25 MG tablet Take 25 mg by mouth daily. 02/27/15   Historical Provider, MD  oxyCODONE (ROXICODONE) 15 MG immediate  release tablet Take 1 tablet (15 mg total) by mouth every 6 (six) hours as needed for pain. 07/11/15   Melvenia Beam, MD   Triage Vitals: BP 169/100 mmHg  Pulse 97  Temp(Src) 98.3 F (36.8 C) (Oral)  Resp 22  SpO2 99%  LMP 09/14/2015   Physical Exam  Constitutional: She is oriented to person, place, and time. She appears well-developed and well-nourished. No distress.  Obese   HENT:  Head: Normocephalic and atraumatic.  Eyes: EOM are normal. Pupils are equal, round, and reactive to light.  Neck: Normal range of motion. Neck supple. No JVD present.  Mobilized in stiff cervical collar Tender diffusely   Cardiovascular: Normal rate, regular rhythm and normal heart sounds.   No murmur heard. Pulmonary/Chest: Effort normal and breath sounds normal. She has no wheezes. She has no rales. She exhibits no tenderness.  Abdominal: Soft. Bowel sounds are normal. She exhibits no distension and no mass. There is no tenderness.  Musculoskeletal: Normal range of motion. She exhibits edema and tenderness.  Tender entire length of spine No instability of knees No swelling Full passive ROM of all joints 1 plus pitting edema  Lymphadenopathy:    She has no cervical adenopathy.  Neurological: She is alert and oriented to person, place, and time. No cranial nerve deficit.  Quadriplegic   Skin: Skin is warm and dry. No rash noted.  Psychiatric: She has a normal mood and affect. Her behavior is normal. Judgment and thought content normal.  Nursing note and vitals reviewed.   ED Course  Procedures (including critical care time)  DIAGNOSTIC STUDIES: Oxygen Saturation is 99% on RA, Normal by my interpretation.    COORDINATION OF CARE: 12:34 AM- Will give Dilaudid and Percocet. Will order CT head without contrast, CT cervical spine without contrast, DG thoracic spine with swimmers, DG lumbar spine complete, DG hip unilat with pelvis 2-3 views, and DG knee complete 4 views L. treatment plan with pt  at bedside and pt agreed to plan.    Imaging Review Dg Thoracic Spine W/swimmers  09/25/2015  CLINICAL DATA:  Status post fall out of wheelchair. Upper back pain. Initial encounter. EXAM: THORACIC SPINE - 3 VIEWS COMPARISON:  Chest radiograph performed 12/20/2014 FINDINGS: There is no evidence of fracture or subluxation. Vertebral bodies demonstrate normal height and alignment. Intervertebral disc spaces are preserved. The visualized portions of both lungs are clear. The mediastinum is unremarkable in appearance. IMPRESSION: No evidence of fracture or subluxation along the thoracic spine. Electronically Signed   By: Garald Balding M.D.   On: 09/25/2015 01:41   Dg Lumbar Spine Complete  09/25/2015  CLINICAL DATA:  Status post fall out of wheelchair.  Lower back pain. Initial encounter. EXAM: LUMBAR SPINE - COMPLETE 4+ VIEW COMPARISON:  MRI of the lumbar spine performed 09/03/2015, and lumbar spine radiographs performed 05/23/2015 FINDINGS: There is no evidence of fracture or subluxation. Vertebral bodies demonstrate normal height and alignment. Intervertebral disc spaces are preserved. The visualized neural foramina are grossly unremarkable in appearance. The visualized bowel gas pattern is unremarkable in appearance; air and stool are noted within the colon. The sacroiliac joints are within normal limits. IMPRESSION: No evidence of fracture or subluxation along the lumbar spine. Electronically Signed   By: Garald Balding M.D.   On: 09/25/2015 01:41   Ct Head Wo Contrast  09/25/2015  CLINICAL DATA:  Lost balance and fell, landing on left side. Neck pain and lower body numbness. Concern for head injury. Initial encounter. EXAM: CT HEAD WITHOUT CONTRAST CT CERVICAL SPINE WITHOUT CONTRAST TECHNIQUE: Multidetector CT imaging of the head and cervical spine was performed following the standard protocol without intravenous contrast. Multiplanar CT image reconstructions of the cervical spine were also generated.  COMPARISON:  CT of the head and CTA of the head and neck performed 09/27/2014, and MRI of the cervical spine performed 04/15/2015 FINDINGS: CT HEAD FINDINGS There is no evidence of acute infarction, mass lesion, or intra- or extra-axial hemorrhage on CT. The posterior fossa, including the cerebellum, brainstem and fourth ventricle, is within normal limits. The third and lateral ventricles, and basal ganglia are unremarkable in appearance. The cerebral hemispheres are symmetric in appearance, with normal gray-white differentiation. No mass effect or midline shift is seen. There is no evidence of fracture; visualized osseous structures are unremarkable in appearance. The orbits are within normal limits. The paranasal sinuses and mastoid air cells are well-aerated. No significant soft tissue abnormalities are seen. CT CERVICAL SPINE FINDINGS There is no evidence of fracture or subluxation. The patient is status post laminectomy at C3-C5, for prior resection of ependymoma. Mild reversal of the normal lordotic curvature of the cervical spine is likely positional in nature. Vertebral bodies demonstrate normal height and alignment. Intervertebral disc spaces are preserved. Prevertebral soft tissues are within normal limits. The visualized neural foramina are grossly unremarkable. The thyroid gland is unremarkable in appearance. The visualized lung apices are clear. No significant soft tissue abnormalities are seen. IMPRESSION: 1. No evidence of traumatic intracranial injury or fracture. 2. No evidence of fracture or subluxation along the cervical spine. 3. Status post laminectomy at C3-C5, for prior resection of ependymoma. Electronically Signed   By: Garald Balding M.D.   On: 09/25/2015 01:18   Ct Cervical Spine Wo Contrast  09/25/2015  CLINICAL DATA:  Lost balance and fell, landing on left side. Neck pain and lower body numbness. Concern for head injury. Initial encounter. EXAM: CT HEAD WITHOUT CONTRAST CT CERVICAL  SPINE WITHOUT CONTRAST TECHNIQUE: Multidetector CT imaging of the head and cervical spine was performed following the standard protocol without intravenous contrast. Multiplanar CT image reconstructions of the cervical spine were also generated. COMPARISON:  CT of the head and CTA of the head and neck performed 09/27/2014, and MRI of the cervical spine performed 04/15/2015 FINDINGS: CT HEAD FINDINGS There is no evidence of acute infarction, mass lesion, or intra- or extra-axial hemorrhage on CT. The posterior fossa, including the cerebellum, brainstem and fourth ventricle, is within normal limits. The third and lateral ventricles, and basal ganglia are unremarkable in appearance. The cerebral hemispheres are symmetric in appearance, with normal gray-white differentiation. No mass effect or midline shift is seen. There is no  evidence of fracture; visualized osseous structures are unremarkable in appearance. The orbits are within normal limits. The paranasal sinuses and mastoid air cells are well-aerated. No significant soft tissue abnormalities are seen. CT CERVICAL SPINE FINDINGS There is no evidence of fracture or subluxation. The patient is status post laminectomy at C3-C5, for prior resection of ependymoma. Mild reversal of the normal lordotic curvature of the cervical spine is likely positional in nature. Vertebral bodies demonstrate normal height and alignment. Intervertebral disc spaces are preserved. Prevertebral soft tissues are within normal limits. The visualized neural foramina are grossly unremarkable. The thyroid gland is unremarkable in appearance. The visualized lung apices are clear. No significant soft tissue abnormalities are seen. IMPRESSION: 1. No evidence of traumatic intracranial injury or fracture. 2. No evidence of fracture or subluxation along the cervical spine. 3. Status post laminectomy at C3-C5, for prior resection of ependymoma. Electronically Signed   By: Garald Balding M.D.   On:  09/25/2015 01:18   Dg Knee Complete 4 Views Left  09/25/2015  CLINICAL DATA:  34 year old female with history of fall out of a wheelchair tonight complaining of left knee pain. EXAM: LEFT KNEE - COMPLETE 4+ VIEW COMPARISON:  No priors. FINDINGS: Four views of the left knee demonstrate no acute displaced fracture, subluxation or dislocation. Mild joint space narrowing, subchondral sclerosis and osteophyte formation, most severe in the medial compartment, compatible with mild osteoarthritis. IMPRESSION: 1. No acute radiographic abnormality of the left knee. Electronically Signed   By: Vinnie Langton M.D.   On: 09/25/2015 01:42   Dg Hip Unilat With Pelvis 2-3 Views Left  09/25/2015  CLINICAL DATA:  Status post fall out of wheelchair, with left hip pain. Initial encounter. EXAM: DG HIP (WITH OR WITHOUT PELVIS) 2-3V LEFT COMPARISON:  None. FINDINGS: There is no evidence of fracture or dislocation. Both femoral heads are seated normally within their respective acetabula. The proximal left femur appears intact. No significant degenerative change is appreciated. The sacroiliac joints are unremarkable in appearance. The visualized bowel gas pattern is grossly unremarkable in appearance. IMPRESSION: No evidence of fracture or dislocation. Electronically Signed   By: Garald Balding M.D.   On: 09/25/2015 01:42   I have personally reviewed and evaluated these images and lab results as part of my medical decision-making.  MDM   Final diagnoses:  Fall from wheelchair, initial encounter  Contusion, back, unspecified laterality, initial encounter  Contusion, hip, left, initial encounter  Contusion of left knee, initial encounter  Pain, neck    Fall from wheelchair with complaints of pain in left hip and knee as well as back and neck. Exam is unremarkable except for pre-existing quadriplegia. 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 She is sent for CT of head and cervical spine as well as plain films of thoracic spine, lumbar spine, left hip, left knee. All imaging studies show no acute injury. Patient was given reassurance. I am concerned her left hip seems to be giving out on her and she is referred to orthopedics for follow-up. She is to continue her home pain management.  I personally performed the services described in this documentation, which was scribed in my presence. The recorded information has been reviewed and is accurate.      Delora Fuel, MD [id number]

## 2015-09-27 ENCOUNTER — Encounter: Payer: Medicaid Other | Attending: Physical Medicine & Rehabilitation | Admitting: Physical Medicine & Rehabilitation

## 2015-10-03 ENCOUNTER — Encounter: Payer: Self-pay | Admitting: Rehabilitation

## 2015-10-03 ENCOUNTER — Ambulatory Visit: Payer: Medicaid Other | Attending: Neurology | Admitting: Rehabilitation

## 2015-10-03 DIAGNOSIS — R2681 Unsteadiness on feet: Secondary | ICD-10-CM | POA: Diagnosis present

## 2015-10-03 DIAGNOSIS — S14104D Unspecified injury at C4 level of cervical spinal cord, subsequent encounter: Secondary | ICD-10-CM | POA: Insufficient documentation

## 2015-10-03 DIAGNOSIS — R269 Unspecified abnormalities of gait and mobility: Secondary | ICD-10-CM | POA: Insufficient documentation

## 2015-10-03 DIAGNOSIS — X58XXXD Exposure to other specified factors, subsequent encounter: Secondary | ICD-10-CM | POA: Insufficient documentation

## 2015-10-03 DIAGNOSIS — R531 Weakness: Secondary | ICD-10-CM | POA: Insufficient documentation

## 2015-10-03 NOTE — Therapy (Addendum)
[facility] [address] [place], [place], [address] Phone: [phone]   Fax:  [phone]  Physical Therapy Evaluation  Patient Details  Name: Cynthia Rosales MRN: [id number] Date of Birth: [birth year] Referring Provider: Alger Simons, MD  Encounter Date: 10/03/2015      PT End of Session - 10/03/15 1259    Visit Number 1   Number of Visits 4  Eval +3 visits   Date for PT Re-Evaluation 12/02/15   Authorization Type MCD    PT Start Time 0930   PT Stop Time 1015   PT Time Calculation (min) 45 min   Activity Tolerance Patient limited by pain;Patient limited by fatigue   Behavior During Therapy [facility] for tasks assessed/performed      Past Medical History  Diagnosis Date  . Hypertension   . Asthma   . Type II diabetes mellitus ([place])   . History of blood transfusion     "after I had one of my kids"  . Sickle cell disease ([place])   . Anemia   . Depression   . Ovarian cyst   . Renal disorder     kidney stones  . Abscess of bursa, left elbow 06/2013    Treated with I and D/antibiotics.     Past Surgical History  Procedure Laterality Date  . Cesarean section  2000; 2007; 2011  . Dilation and curettage of uterus    . Appendectomy  2013  . Reduction mammaplasty Bilateral 1998  . Laminectomy N/A 12/13/2014    Procedure: CERVICAL LAMINECTOMY FOR INTRADURAL TUMOR;  Surgeon: Charlie Pitter, MD;  Location: [facility];  Service: Neurosurgery;  Laterality: N/A;  posterior  . Carpal tunnel release      There were no vitals filed for this visit.  Visit Diagnosis:  Abnormality of gait - Plan: PT plan of care cert/re-cert  Generalized weakness - Plan: PT plan of care cert/re-cert  C4 spinal cord injury, subsequent encounter [facility]) - Plan: PT plan of care cert/re-cert  Unsteadiness - Plan: PT plan of care cert/re-cert      Subjective Assessment - 10/03/15 0939    Subjective "I can't lift my legs, they feel stiff."   "I'm falling a lot."  "My knees give out on me."    Limitations Walking;House hold activities;Standing   Patient Stated Goals "I want to be able to bend my legs"    Currently in Pain? Yes   Pain Score 9    Pain Location Neck   Pain Orientation Upper   Pain Descriptors / Indicators Aching;Sharp   Pain Type Chronic pain   Pain Onset More than a month ago   Pain Frequency Constant   Aggravating Factors  standing, walking   Pain Relieving Factors elevating legs            [facility] PT Assessment - 10/03/15 0001    Assessment   Medical Diagnosis C4 spinal cord injury, subsequent encounter   Referring Provider Alger Simons, MD   Onset Date/Surgical Date 12/13/14   Hand Dominance Right   Precautions   Precautions Fall   Restrictions   Weight Bearing Restrictions Yes   Other Position/Activity Restrictions Pt reports supposed to limit weight through wrist   Balance Screen   Has the patient fallen in the past 6 months Yes   How many times? 7   Has the patient had a decrease in activity level because of a fear of falling?  Yes   Is the patient  reluctant to leave their home because of a fear of falling?  Yes   [place] residence   Living Arrangements Children;Spouse/significant other   Available Help at Discharge Personal care attendant;Friend(s);Family;Available PRN/intermittently  HH aide there 7days/wk 2-3 hours at a time   Type of [place] to enter   Entrance Stairs-Number of Steps 3   Entrance Stairs-Rails Right;Left;Cannot reach both   [place] One level   [place] - 4 wheels;Shower seat;Tub bench;Grab bars - tub/shower  needs toilet seat   Prior Function   Level of Independence Independent   Vocation Full time employment   Cognition   Overall Cognitive Status Within Functional Limits for tasks assessed   Observation/Other Assessments-Edema    Edema --  swelling noted in BLEs    Sensation    Light Touch Impaired Detail   Light Touch Impaired Details Impaired RUE;Absent LLE;Absent RLE  mostly absent in LLE, however did feel slight pain sensation   Coordination   Gross Motor Movements are Fluid and Coordinated No   Fine Motor Movements are Fluid and Coordinated No   ROM / Strength   AROM / PROM / Strength Strength   Strength   Overall Strength Deficits;Due to pain   Overall Strength Comments all muscle groups with only trace to 2-/5 strength during formal testing, however pt able to functionall walk with rollator without heavy reliance of UE support.    Bed Mobility   Bed Mobility Rolling Left;Left Sidelying to Sit;Sit to Supine   Rolling Left 6: Modified independent (Device/Increase time)   Left Sidelying to Sit 6: Modified independent (Device/Increase time)   Sit to Supine 4: Min assist   Transfers   Transfers Sit to Stand;Stand to Sit   Sit to Stand 5: Supervision   Stand to Sit 5: Supervision   Comments Cues for hand placement   Ambulation/Gait   Ambulation/Gait Yes   Ambulation/Gait Assistance 5: Supervision   Ambulation Distance (Feet) 115 Feet   Assistive device 4-wheeled walker;Rolling walker   Gait Pattern Step-through pattern;Decreased stride length;Decreased hip/knee flexion - right;Decreased hip/knee flexion - left;Decreased dorsiflexion - right;Decreased dorsiflexion - left;Trunk flexed;Wide base of support;Antalgic   Ambulation Surface Level;Indoor   Gait velocity .78 ft/sec   Stairs Yes   Stairs Assistance 5: Supervision   Stair Management Technique Two rails;Step to pattern;Forwards   Number of Stairs 4   Height of Stairs 6   Standardized Balance Assessment   Standardized Balance Assessment Timed Up and Go Test   Timed Up and Go Test   TUG Normal TUG   Normal TUG (seconds) 48.63                           PT Education - 10/03/15 1259    Education provided Yes   Education Details Education on POC, limited visits due to MCD, and  evaluation findings   Person(s) Educated Patient   Methods Explanation   Comprehension Verbalized understanding          PT Short Term Goals - 10/03/15 1310    PT SHORT TERM GOAL #1   Title Pt will initiate HEP for BLE strengthening to improve functional mobility (Target Date: end of 1st visit)   Baseline dependent 10/03/15           PT Long Term Goals - 10/03/15 1309    PT LONG TERM GOAL #1   Title  Pt will be independent with HEP for BLE strengthening, endurance and balance to decrease fall risk and improve functional mobility (Target Date: 3rd visit)   Baseline dependent 10/03/15   PT LONG TERM GOAL #2   Title Pt will increase gait speed to 1.38 ft/sec w/ LRAD to increase efficiency of gait and decrease fall risk (Target Date: 3rd visit)   Baseline .78 ft/sec 10/03/15   PT LONG TERM GOAL #3   Title Pt will verbalize understanding of fall prevention strategies to reduce fall risk in home.  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  Pt now with C4 spinal cord injury, subsequent encounter (Late effect of spinal cord injury 907.2) with paresthesias and hemisensory loss with weakness in BLEs and RUE.  Upon PT evaluation, note significant weakness in BLEs (better functionally than with formal testing) mostly due to increased pain and paresthesias.  Gait speed is .78 ft/sec placing pt at very significant fall risk and TUG score of 48.63 seconds, also indicative of significant fall risk.     Pt will benefit from skilled therapeutic intervention in order to improve on the following deficits Abnormal gait;Decreased activity tolerance;Decreased balance;Decreased coordination;Decreased endurance;Decreased knowledge of use of DME;Decreased mobility;Decreased strength;Difficulty  walking;Increased edema;Impaired perceived functional ability;Impaired sensation;Impaired UE functional use;Improper body mechanics;Postural dysfunction;Obesity;Pain   Rehab Potential Fair   Clinical Impairments Affecting Rehab Potential pain and limited visits due to MCD   PT Frequency 1x / week  total of 3 visits, once every other week   PT Duration 6 weeks   PT Treatment/Interventions ADLs/Self Care Home Management;DME Instruction;Gait training;Stair training;Functional mobility training;Therapeutic activities;Therapeutic exercise;Balance training;Neuromuscular re-education;Patient/family education;Orthotic Fit/Training;Energy conservation   PT Next Visit Plan Est HEP for strengthening, explain that pt will have to buy bari RW out of pocket, endurance   Consulted and Agree with Plan of Care Patient         Problem List Patient Active Problem List   Diagnosis Date Noted  . 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  . Neurogenic bladder 08/23/2015  . Bilateral carpal tunnel syndrome 08/23/2015  . Paraparesis of both lower limbs ([place]) 07/14/2015  . Ileus, postoperative 12/25/2014  . Sickle cell disease without crisis ([place]) 12/25/2014  . Diabetes mellitus type 2 in obese ([place]) 12/25/2014  . 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[place]) 12/24/2014  . 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 Ileus of unspecified type ([place])   . Atelectasis   . Bowel obstruction ([place])   . Encounter for central line care   . Encounter for nasogastric (NG) tube placement   . Ileus ([place])   . Intestinal occlusion ([facility])   . Spinal cord tumor ([place]) 12/11/2014  . Headache 11/19/2014  . Neck pain 11/19/2014  . Right sided weakness 11/19/2014  . Paresthesias 11/19/2014  . Nightmares 09/11/2014  . HTN (hypertension), benign 03/28/2014  . Nephrolithiasis 03/27/2014  . Diabetes mellitus ([place]) 03/27/2014  . Abdominal pain 03/27/2014    Cameron Sprang, PT, MPT [facility] [address] [place], [place], [address] Phone: [phone]   Fax:  [phone] 10/03/2015, 1:17 PM  Name: Cynthia Rosales MRN: [id number] Date of Birth: 1981-05-15

## 2015-10-14 ENCOUNTER — Ambulatory Visit: Payer: Medicaid Other | Admitting: Neurology

## 2015-10-18 ENCOUNTER — Encounter: Payer: Self-pay | Admitting: Rehabilitation

## 2015-10-18 ENCOUNTER — Ambulatory Visit: Payer: Medicaid Other | Admitting: Rehabilitation

## 2015-10-18 DIAGNOSIS — R2681 Unsteadiness on feet: Secondary | ICD-10-CM

## 2015-10-18 DIAGNOSIS — R269 Unspecified abnormalities of gait and mobility: Secondary | ICD-10-CM

## 2015-10-18 DIAGNOSIS — R531 Weakness: Secondary | ICD-10-CM

## 2015-10-18 DIAGNOSIS — S14104D Unspecified injury at C4 level of cervical spinal cord, subsequent encounter: Secondary | ICD-10-CM

## 2015-10-18 NOTE — Therapy (Signed)
[facility] [address] [place], [place], [address] Phone: [phone]   Fax:  [phone]  Physical Therapy Treatment  Patient Details  Name: Westlynn Fifer MRN: [id number] Date of Birth: 04/08/1981 Referring Provider: Alger Simons, MD  Encounter Date: 10/18/2015      PT End of Session - 10/18/15 1042    Visit Number 2   Number of Visits 4  Eval +3 visits   Date for PT Re-Evaluation 12/02/15   Authorization Type MCD approved 3 visits until 11/26/15   Authorization - Visit Number 1   Authorization - Number of Visits 3   PT Start Time 0846   PT Stop Time 0930   PT Time Calculation (min) 44 min   Activity Tolerance Patient limited by pain;Patient limited by fatigue   Behavior During Therapy [facility] for tasks assessed/performed      Past Medical History  Diagnosis Date  . Hypertension   . Asthma   . Type II diabetes mellitus ([place])   . History of blood transfusion     "after I had one of my kids"  . Sickle cell disease ([place])   . Anemia   . Depression   . Ovarian cyst   . Renal disorder     kidney stones  . Abscess of bursa, left elbow 06/2013    Treated with I and D/antibiotics.     Past Surgical History  Procedure Laterality Date  . Cesarean section  2000; 2007; 2011  . Dilation and curettage of uterus    . Appendectomy  2013  . Reduction mammaplasty Bilateral 1998  . Laminectomy N/A 12/13/2014    Procedure: CERVICAL LAMINECTOMY FOR INTRADURAL TUMOR;  Surgeon: Charlie Pitter, MD;  Location: [facility];  Service: Neurosurgery;  Laterality: N/A;  posterior  . Carpal tunnel release      There were no vitals filed for this visit.  Visit Diagnosis:  Abnormality of gait  Generalized weakness  Unsteadiness  C4 spinal cord injury, subsequent encounter [facility])      Subjective Assessment - 10/18/15 0857    Subjective "My left leg and butt is killing me."  "I went to the doctor about my wrist, it was  infected, but they gave me medication."    Limitations Walking;House hold activities;Standing   Patient Stated Goals "I want to be able to bend my legs"    Currently in Pain? Yes   Pain Score 10-Worst pain ever   Pain Location Hip   Pain Orientation Left   Pain Descriptors / Indicators Stabbing;Sharp   Pain Type Chronic pain   Pain Radiating Towards buttocks and down leg   Pain Onset More than a month ago   Pain Frequency Constant               Therex:  Provided pt with initial HEP for BLE stretching and strengthening.  See pt instructions for full details.    Note that PT educated pt on need to purchase bariatric RW on her own.  Looked at several on [place]com with cues on which RW to purchase.  Pt verbalized understanding.                     PT Education - 10/18/15 1041    Education provided Yes   Education Details Education on need to purchase bari RW (made sure that rollator was filed through insurance), HEP   Person(s) Educated Patient   Methods Explanation;Handout;Demonstration   Comprehension Verbalized understanding;Returned  demonstration          PT Short Term Goals - 10/18/15 1045    PT SHORT TERM GOAL #1   Title Pt will initiate HEP for BLE strengthening to improve functional mobility (Target Date: end of 1st visit)   Baseline dependent 10/03/15, met 10/18/15   Status Achieved           PT Long Term Goals - 10/03/15 1309    PT LONG TERM GOAL #1   Title Pt will be independent with HEP for BLE strengthening, endurance and balance to decrease fall risk and improve functional mobility (Target Date: 3rd visit)   Baseline dependent 10/03/15   PT LONG TERM GOAL #2   Title Pt will increase gait speed to 1.38 ft/sec w/ LRAD to increase efficiency of gait and decrease fall risk (Target Date: 3rd visit)   Baseline .78 ft/sec 10/03/15   PT LONG TERM GOAL #3   Title Pt will verbalize understanding of fall prevention strategies to reduce fall risk in  home.  (Target Date: 3rd visit)   Baseline dependent 10/03/15               Plan - 10/18/15 1043    Clinical Impression Statement Skilled session focused on providing pt with BLE stretching and strengthening program to carryover at home, addressing STG.  Pt tolerated well with mild increase in pain, but better with rest.  Note marked decrease in strength in supine, however seems improved during functional mobility.     Pt will benefit from skilled therapeutic intervention in order to improve on the following deficits Abnormal gait;Decreased activity tolerance;Decreased balance;Decreased coordination;Decreased endurance;Decreased knowledge of use of DME;Decreased mobility;Decreased strength;Difficulty walking;Increased edema;Impaired perceived functional ability;Impaired sensation;Impaired UE functional use;Improper body mechanics;Postural dysfunction;Obesity;Pain   Rehab Potential Fair   Clinical Impairments Affecting Rehab Potential pain and limited visits due to MCD   PT Frequency 1x / week  total of 3 visits, once every other week   PT Duration 6 weeks   PT Treatment/Interventions ADLs/Self Care Home Management;DME Instruction;Gait training;Stair training;Functional mobility training;Therapeutic activities;Therapeutic exercise;Balance training;Neuromuscular re-education;Patient/family education;Orthotic Fit/Training;Energy conservation   PT Next Visit Plan Continue BLE strengthening, initiate walking program   PT Home Exercise Plan see pt instruction   Consulted and Agree with Plan of Care Patient        Problem List Patient Active Problem List   Diagnosis Date Noted  . 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  . Neurogenic bladder 08/23/2015  . Bilateral carpal tunnel syndrome 08/23/2015  . Paraparesis of both lower limbs ([place]) 07/14/2015  . Ileus, postoperative 12/25/2014  . Sickle cell disease without crisis ([place]) 12/25/2014  . Diabetes mellitus type 2 in obese ([place]) 12/25/2014  .  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[place]) 12/24/2014  . 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 Ileus of unspecified type ([place])   . Atelectasis   . Bowel obstruction ([place])   . Encounter for central line care   . Encounter for nasogastric (NG) tube placement   . Ileus ([place])   . Intestinal occlusion ([facility])   . Spinal cord tumor ([place]) 12/11/2014  . Headache 11/19/2014  . Neck pain 11/19/2014  . Right sided weakness 11/19/2014  . Paresthesias 11/19/2014  . Nightmares 09/11/2014  . HTN (hypertension), benign 03/28/2014  . Nephrolithiasis 03/27/2014  . Diabetes mellitus ([place]) 03/27/2014  . Abdominal pain 03/27/2014    Cameron Sprang, PT, MPT [facility] [address] [place], [place], [address]  Phone: [phone]   Fax:  [phone] 10/18/2015, 10:46 AM  Name: Tomicka Lover MRN: [id number] Date of Birth: Nov 25, 1981

## 2015-10-18 NOTE — Patient Instructions (Signed)
HIP / KNEE: Flexion, Knee to Chest - Supine    Use sheet around your knee.  Raise knee to chest. Keep leg in a straight line. Perform slowly. Hold for 60 seconds.  Repeat on the other side.    Copyright  VHI. All rights reserved.   Flexors, Supine Bridge    Lie supine, feet shoulder-width apart. Lift hips toward ceiling. Hold _5__ seconds. Repeat _10__ times per session. Do _2__ sessions per day.  Copyright  VHI. All rights reserved.   Hip Flexors (Supine)    Lie on back near edge of the bed.  Lower outer leg off edge of bed.   Apply downward pressure to leg hanging over edge. Do not allow hips to roll up.  Hold __60__ seconds. Repeat _2  times. Do _2___ sessions per day. CAUTION: Stretch should be gentle, steady and slow.  Copyright  VHI. All rights reserved.   Abduction: Clam (Eccentric) - Side-Lying    Lie on side with knees bent. Lift top knee, keeping feet together. Keep trunk steady. Slowly lower for 3-5 seconds. _10_ reps per set, _2__ sets per day, __5_ days per week.   http://ecce.exer.us/65   Copyright  VHI. All rights reserved.   PELVIC TILT: Posterior    Tighten abdominals, flatten low back. _10__ reps per set and hold for 5 seconds!!, __2_ sets per day, __5_ days per week   Copyright  VHI. All rights reserved.   Functional Quadriceps: Sit to Stand    Sit on edge of chair, feet flat on floor. Stand upright, extending knees fully. Repeat __10__ times per set. Do _1___ sets per session. Do __2__ sessions per day.  http://orth.exer.us/735   Copyright  VHI. All rights reserved.

## 2015-10-28 ENCOUNTER — Ambulatory Visit: Payer: Medicaid Other | Admitting: Rehabilitation

## 2015-11-06 ENCOUNTER — Ambulatory Visit: Payer: Medicaid Other | Admitting: Rehabilitation

## 2015-11-13 ENCOUNTER — Encounter: Payer: Self-pay | Admitting: Rehabilitation

## 2015-11-13 ENCOUNTER — Ambulatory Visit: Payer: Medicaid Other | Attending: Neurology | Admitting: Rehabilitation

## 2015-11-13 DIAGNOSIS — R2681 Unsteadiness on feet: Secondary | ICD-10-CM | POA: Diagnosis present

## 2015-11-13 DIAGNOSIS — R269 Unspecified abnormalities of gait and mobility: Secondary | ICD-10-CM | POA: Diagnosis not present

## 2015-11-13 DIAGNOSIS — R531 Weakness: Secondary | ICD-10-CM

## 2015-11-13 DIAGNOSIS — X58XXXD Exposure to other specified factors, subsequent encounter: Secondary | ICD-10-CM | POA: Insufficient documentation

## 2015-11-13 DIAGNOSIS — S14104D Unspecified injury at C4 level of cervical spinal cord, subsequent encounter: Secondary | ICD-10-CM | POA: Insufficient documentation

## 2015-11-13 NOTE — Therapy (Signed)
[facility] [address] [place], [place], [address] Phone: [phone]   Fax:  [phone]  Physical Therapy Treatment  Patient Details  Name: Cynthia Rosales MRN: [id number] Date of Birth: 1981-07-18 Referring Provider: Alger Simons, MD  Encounter Date: 11/13/2015      PT End of Session - 11/13/15 0854    Visit Number 3   Number of Visits 4  Eval +3 visits   Date for PT Re-Evaluation 12/02/15   Authorization Type MCD approved 3 visits until 11/26/15   Authorization - Visit Number 2   Authorization - Number of Visits 3   PT Start Time 9767   PT Stop Time 0932   PT Time Calculation (min) 45 min   Activity Tolerance Patient limited by pain;Patient limited by fatigue   Behavior During Therapy [facility] for tasks assessed/performed      Past Medical History  Diagnosis Date  . Hypertension   . Asthma   . Type II diabetes mellitus ([place])   . History of blood transfusion     "after I had one of my kids"  . Sickle cell disease ([place])   . Anemia   . Depression   . Ovarian cyst   . Renal disorder     kidney stones  . Abscess of bursa, left elbow 06/2013    Treated with I and D/antibiotics.     Past Surgical History  Procedure Laterality Date  . Cesarean section  2000; 2007; 2011  . Dilation and curettage of uterus    . Appendectomy  2013  . Reduction mammaplasty Bilateral 1998  . Laminectomy N/A 12/13/2014    Procedure: CERVICAL LAMINECTOMY FOR INTRADURAL TUMOR;  Surgeon: Charlie Pitter, MD;  Location: [facility];  Service: Neurosurgery;  Laterality: N/A;  posterior  . Carpal tunnel release      There were no vitals filed for this visit.  Visit Diagnosis:  Abnormality of gait  Generalized weakness  Unsteadiness  C4 spinal cord injury, subsequent encounter [facility])      Subjective Assessment - 11/13/15 0851    Subjective "My L hip is still killing me, I saw my primary care MD and she said she referred me  to an ortho doc but they haven't called me yet."   Limitations Walking;House hold activities;Standing   Patient Stated Goals "I want to be able to bend my legs"    Currently in Pain? Yes   Pain Score 10-Worst pain ever   Pain Location Hip   Pain Orientation Left   Pain Descriptors / Indicators Sharp;Stabbing   Pain Type Chronic pain   Pain Radiating Towards buttocks and down leg, deep into hip and groin   Pain Onset More than a month ago   Pain Frequency Constant   Aggravating Factors  standing, walking   Pain Relieving Factors nothing makes it better          Therex:  Went over current HEP of clamshell activity only to ensure proper mechanics and to continue to strengthen B hip abductors.  Performed x 10 reps BLE with tactile cues on less trunk rotation.  Note improved ability to lift lower extremity vs last session.  Provided pt with heel slides with use of sheet for self assist and performed x 10 reps BLE with cues for increased use of LE vs "pulling" with sheet.  Pt returned demonstration and added to HEP, see pt instruction for details.  While in SL, provided light IT band stretch to  L hip as this seems to be source of pain as well as actual hip joint pain.  Pt only able to tolerate light stretch x 60 secs due to increased pain.  Also attempted L hip flex stretch off EOM, however pt unable to tolerate due to increased pain.    Gait:  Performed gait with use of bari RW for improved quality of gait as well as endurance and walking tolerance.  Did not initiate walking program today due to increased pain.  Performed 230' at mod I level with improved gait quality as she continued however initially requires cues for decreased antalgic and Trendelenburg like gait pattern, however limited due to decreased ability to push through UEs due to carpal tunnel syndrome.                         PT Education - 11/13/15 0854    Education provided Yes   Education Details Education on  follow up with PCP for referral to ortho MD, education on continued recommendation for bari RW vs rollator.    Person(s) Educated Patient   Methods Explanation   Comprehension Verbalized understanding          PT Short Term Goals - 10/18/15 1045    PT SHORT TERM GOAL #1   Title Pt will initiate HEP for BLE strengthening to improve functional mobility (Target Date: end of 1st visit)   Baseline dependent 10/03/15, met 10/18/15   Status Achieved           PT Long Term Goals - 10/03/15 1309    PT LONG TERM GOAL #1   Title Pt will be independent with HEP for BLE strengthening, endurance and balance to decrease fall risk and improve functional mobility (Target Date: 3rd visit)   Baseline dependent 10/03/15   PT LONG TERM GOAL #2   Title Pt will increase gait speed to 1.38 ft/sec w/ LRAD to increase efficiency of gait and decrease fall risk (Target Date: 3rd visit)   Baseline .78 ft/sec 10/03/15   PT LONG TERM GOAL #3   Title Pt will verbalize understanding of fall prevention strategies to reduce fall risk in home.  (Target Date: 3rd visit)   Baseline dependent 10/03/15               Plan - 11/13/15 0855    Clinical Impression Statement Skilled session continues to focus on BLE strengthening in more supine positions rather than standing due to 10/10 L hip pain today.  Note that she has not taken any pain medication.  She states that this makes her very fatigued.  Educated her to notifiy MD as she may need new combination of pain medication for better pain relief and less fatigue.     Pt will benefit from skilled therapeutic intervention in order to improve on the following deficits Abnormal gait;Decreased activity tolerance;Decreased balance;Decreased coordination;Decreased endurance;Decreased knowledge of use of DME;Decreased mobility;Decreased strength;Difficulty walking;Increased edema;Impaired perceived functional ability;Impaired sensation;Impaired UE functional use;Improper body  mechanics;Postural dysfunction;Obesity;Pain   Rehab Potential Fair   Clinical Impairments Affecting Rehab Potential pain and limited visits due to MCD   PT Frequency 1x / week  total of 3 visits, once every other week   PT Duration 6 weeks   PT Treatment/Interventions ADLs/Self Care Home Management;DME Instruction;Gait training;Stair training;Functional mobility training;Therapeutic activities;Therapeutic exercise;Balance training;Neuromuscular re-education;Patient/family education;Orthotic Fit/Training;Energy conservation   PT Next Visit Plan Continue BLE strengthening (add standing exercises if able), initiate walking program (wasn't able to  on last visit due to 10/10 hip pain)   PT Home Exercise Plan see pt instruction   Consulted and Agree with Plan of Care Patient        Problem List Patient Active Problem List   Diagnosis Date Noted  . 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  . Neurogenic bladder 08/23/2015  . Bilateral carpal tunnel syndrome 08/23/2015  . Paraparesis of both lower limbs ([place]) 07/14/2015  . Ileus, postoperative 12/25/2014  . Sickle cell disease without crisis ([place]) 12/25/2014  . Diabetes mellitus type 2 in obese ([place]) 12/25/2014  . 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[place]) 12/24/2014  . 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 Ileus of unspecified type ([place])   . Atelectasis   . Bowel obstruction ([place])   . Encounter for central line care   . Encounter for nasogastric (NG) tube placement   . Ileus ([place])   . Intestinal occlusion ([facility])   . Spinal cord tumor ([place]) 12/11/2014  . Headache 11/19/2014  . Neck pain 11/19/2014  . Right sided weakness 11/19/2014  . Paresthesias 11/19/2014  . Nightmares 09/11/2014  . HTN (hypertension), benign 03/28/2014  . Nephrolithiasis 03/27/2014  . Diabetes mellitus ([place]) 03/27/2014  . Abdominal pain 03/27/2014    Cameron Sprang, PT, MPT [facility] [address] [place], [place], [address] Phone: [phone]   Fax:  [phone] 11/13/2015, 9:43 AM  Name: Cynthia Rosales MRN: [id number] Date of Birth: 1981-03-30

## 2015-11-13 NOTE — Patient Instructions (Signed)
Bracing With Heel Slides (Supine)    With neutral spine, tighten pelvic floor and abdominals and hold. Alternating legs, use sheet to slide heel to bottom. Repeat _10__ times. Do _2__ times a day.  Use as much leg as you can vs pulling with the sheet.    Copyright  VHI. All rights reserved.

## 2015-11-18 ENCOUNTER — Ambulatory Visit: Payer: Self-pay | Admitting: Skilled Nursing Facility1

## 2015-11-20 ENCOUNTER — Ambulatory Visit: Payer: Self-pay | Admitting: Rehabilitation

## 2015-11-26 ENCOUNTER — Encounter: Payer: Self-pay | Admitting: Rehabilitation

## 2015-11-26 ENCOUNTER — Ambulatory Visit: Payer: Medicaid Other | Admitting: Rehabilitation

## 2015-11-26 NOTE — Therapy (Signed)
[facility] [address] [place], [place], [address] Phone: [phone]   Fax:  [phone]  Patient Details  Name: Cynthia Rosales MRN: [id number] Date of Birth: Jul 07, 1981 Referring Provider:  No ref. provider found  Encounter Date: 11/26/2015   PHYSICAL THERAPY DISCHARGE SUMMARY  Visits from Start of Care: 3  Current functional level related to goals / functional outcomes:     PT Long Term Goals - 11/26/15 1306    PT LONG TERM GOAL #1   Title Pt will be independent with HEP for BLE strengthening, endurance and balance to decrease fall risk and improve functional mobility (Target Date: 3rd visit)   Baseline dependent 10/03/15   PT LONG TERM GOAL #2   Title Pt will increase gait speed to 1.38 ft/sec w/ LRAD to increase efficiency of gait and decrease fall risk (Target Date: 3rd visit)   Baseline .78 ft/sec 10/03/15   PT LONG TERM GOAL #3   Title Pt will verbalize understanding of fall prevention strategies to reduce fall risk in home.  (Target Date: 3rd visit)   Baseline dependent 10/03/15        Remaining deficits: Unable to determine if pt has met LTG's as she did not come to last visit.   She continues to have marked strength deficits and feel that she would be safer with bari RW.  This PT called pt and she is still working to get one of these in the new year to increase safety with gait.  Encouraged her to continue with HEP and staying active.     Education / Equipment: HEP  Plan: Patient agrees to discharge.  Patient goals were not met. Patient is being discharged due to financial reasons.  ?????This was last day for Medicaid coverage.         Cameron Sprang, PT, MPT [facility] [address] [place], [place], [address] Phone: [phone]   Fax:  [phone] 11/26/2015, 1:06 PM

## 2015-12-17 ENCOUNTER — Ambulatory Visit: Payer: Medicaid Other | Admitting: Neurology

## 2015-12-18 ENCOUNTER — Ambulatory Visit: Payer: Medicaid Other | Admitting: Neurology

## 2015-12-19 ENCOUNTER — Encounter: Payer: Self-pay | Admitting: Neurology

## 2016-01-02 ENCOUNTER — Ambulatory Visit (INDEPENDENT_AMBULATORY_CARE_PROVIDER_SITE_OTHER): Payer: Medicaid Other | Admitting: Neurology

## 2016-01-02 ENCOUNTER — Encounter: Payer: Self-pay | Admitting: Neurology

## 2016-01-02 VITALS — BP 144/95 | HR 109 | Ht 63.0 in | Wt 331.8 lb

## 2016-01-02 DIAGNOSIS — G4733 Obstructive sleep apnea (adult) (pediatric): Secondary | ICD-10-CM | POA: Diagnosis not present

## 2016-01-02 DIAGNOSIS — S14104S Unspecified injury at C4 level of cervical spinal cord, sequela: Secondary | ICD-10-CM

## 2016-01-02 DIAGNOSIS — R32 Unspecified urinary incontinence: Secondary | ICD-10-CM

## 2016-01-02 DIAGNOSIS — G8250 Quadriplegia, unspecified: Secondary | ICD-10-CM | POA: Diagnosis not present

## 2016-01-02 DIAGNOSIS — G894 Chronic pain syndrome: Secondary | ICD-10-CM | POA: Diagnosis not present

## 2016-01-02 DIAGNOSIS — R258 Other abnormal involuntary movements: Secondary | ICD-10-CM | POA: Diagnosis not present

## 2016-01-02 DIAGNOSIS — R252 Cramp and spasm: Secondary | ICD-10-CM

## 2016-01-02 MED ORDER — DIAZEPAM 5 MG PO TABS: ORAL_TABLET | ORAL | Status: DC

## 2016-01-02 MED ORDER — OXYCODONE HCL 30 MG PO TABS: 30.0000 mg | ORAL_TABLET | Freq: Four times a day (QID) | ORAL | Status: DC | PRN

## 2016-01-02 MED ORDER — OXYCODONE HCL 15 MG PO TABS: 30.0000 mg | ORAL_TABLET | Freq: Four times a day (QID) | ORAL | Status: DC | PRN

## 2016-01-02 NOTE — Progress Notes (Signed)
[facility]    Provider:  Dr  Referring Provider: [facility], [facility] Primary Care Physician:  [facility]  CC:  c4 injury, tetraparesis, incontinece of bowel and bladder, spasticity and chronic pain.  Interval history 01/02/2016:  Cynthia Rosales is a 35 y.o. female here as a follow up. Patient is an unfortunate 35-year-old female with cervical laminectomy with resection of intradural intramedullary tumor ependymoma and c4 injury, tetraparesis, incontinece of bowel and bladder, spasticity and chronic pain.  She is having significant pain and spasticity. She had a hip procedure the 31st (2 days ago) and since then her left leg is numb with severe pain. She is also have significant muscle spasticity in the lower legs, chronic low back pain, she is morbidly obese. Diazepam 5mg twice daily helps but she feel she could use it more often than that. No side effects. Will increase diazepam for muscle spasticity. Warned against addicition, sedation with respratory depression, don't combine with other pain medications. She is exhausted during the day, snoring a lot at night.. She has morning headaches. She is morbidly obese. She is exhausted. She is fatigued. She can fall asleep easily. She wakes herslf up snoring. She is taking 2 x 15mg Oxy IR every 6 hours. I will refill but she need pain management. Will refill, will talk to Michelle Edwards and refer to pain management.  Will order a sleep evaluation for OSA.   Her current physicians: Dr. Poole - neurosurgery Michelle Edwards TAPN, manages pain medications. She takes oxy IR 15mg x 2 every 6 hours. Probably need a long-acting drug but insurance won't approve per patient Dr. Swartz - Rehab Will refer to Pain clinic - prefer [facility] but will need to see who will take her insurance. Explained to her I can refill her pain meds but she will have to be seen in pain clinic for further menagaement. She says  she spoke to Michelle Edwards and told her she was going to ask me for a refill. I will also contact Michelle edwards.   Interval history 07/11/2015:Patient is an unfortunate 35-year-old female with cervical laminectomy with resection of intradural intramedullary tumor ependymoma and subsequent cervical spine injury at C4. Dr. Poole recommended complete and total disability and they declined her. She is incontinent or bowel and bladder. She is embarassed, she cries. She can't stand without using her arms. She has swelling in the legs and feels like she is walking on a balloon. She uses a walker. She has to keep her knees locked to walk. She has no balance. It hurts from the hips to the toes. She has numbness and tingling in her arms. Her whole right arm is numb, the fingertips are cold and numb. She has hand weakness and can't make a fist due to weakness. It is burning and sharp with pain. Her legs are totally stiff. She can't lift up her legs, totally stiff. She has bilateral CTS which was put off since they found the tumor. She has sharp pain in the hips and lower back. She has pain shooting in her hips. Sharp radiating pains. She is on oxycodone and may be on pain medication chronically. She has been seeing an NP but doesn' t have a primary care doctor. She is itching a lot from the morphine. She tried Lyrica and it didn't work, made her stomach hurt. Neurontin 300mg not helping. No side effects from the gabapentin. EMG in the past showed severe carpal tunnel syndrome on the   right and moderately severe on the left.  MRI cervical spine 04/15/2015;  Interval laminectomy for tumor resection of an ependymoma extending from C3-4 to C5. Slight enlargement of the cord diameter at the level of surgery, compared to the normal cord diameter. Very low level enhancement at the previous site of the tumor. The study is ndeterminate for postoperative change versus residual/recurrent tumor. Follow-up study in 2-3 months is  suggested.  :  Abnormal MRI cervical spine 12/05/2014 (with and without) demonstrating: 1. There is intradural intramedullary cystic, expansile lesion within the spinal cord at C4-C5 levels measuring 0.9x0.8x2.2cm (APxtransxSI) with internal webs/septations. Concerning for neoplastic process. Infectious, vascular or traumatic syringomyelia etiologies less likely. Consider serial imaging with post-contrast sequences for further evaluation. 2. Remainder of of spinal cord unremarkable.  EMG/NCS 09/25/2014: There is electrophysiologic evidence for a severe right Carpal Tunnel Syndrome and a moderately-severe left Carpal Tunnel Syndrome. Needle examination shows acute/ongoing denervation in right lower-cervical paraspinal muscles which is consistent with cervical radiculopathy. The affected cervical root cannot be localized by paraspinal level but does raise the possibility of a double crush syndrome. Consider MRI of the cervical spine as clinically warranted  Notes from Dr. Poole: Follow-up MRI scan of the patient's cervical spine demonstrates evidence of cervical laminectomy and resection of her intradural intramedullary tumor without obvious recurrent disease. The spinal cord itself appears to be healing well. Dr. Poole states that the patient is permanently and totally disabled, this is likely permanent.    Interval history 06/19/2015: Cynthia Rosales is a 35 y.o. female here as a follow up. 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 She was discharged in February of 2016 with discharge diagnoses of tetrplegia, HTN, ependymoma, c4 spinal cord injury, DM2, postoperative ileus, morbid obesity, SSD. She was admitted to rehabilitation in January 2016 for inpatient therapy. Notes state that at admission, patient required minimal assistance with mobility and ADL tasks. She improved during rehabilitation and activity tolerance, balance, posture  control as well as ability to compensate for deficits. She had improvement in functional use of her bilateral upper and lower extremities.   She has no feeling from her hips down, her legs are giving out on her with falls. Her legs are cold from the knees down, her feet are cold all the time. Her right side still has numbness. If she picks up a pen, she has pain in her arms. She feels burning and sharp pain in her right arm. Dr. Reports that she has been told she has severe nerve damage. The lower part of her back hurts. She is in chronic pain. He right eye is still blurry. The right side of her face is numb and sometimes she can't feel her ear. The back of the neck hurts. Her legs are giving out. She is in bed 4-5 days at a time. Sometimes she can't get out of the bed. She can't walk. She is crying profusely in the office today.   Review of Systems: Patient complains of symptoms per HPI as well as the following symptoms: no CP, no SOB. Pertinent negatives per HPI. All others negative.   Social History   Social History  . Marital Status: Single    Spouse Name: N/A  . Number of Children: 3  . Years of Education: 11   Occupational History  . Unemployed     Social History Main Topics  . Smoking status: Never Smoker   . Smokeless   tobacco: Never Used     Comment: 03/27/2014 "smoked ~ 1 cigarette/day; quit in ~ 2013"  . Alcohol Use: 0.0 oz/week    0 Standard drinks or equivalent per week  . Drug Use: No  . Sexual Activity: Yes   Other Topics Concern  . Not on file   Social History Narrative   Patient lives at home with boyfriend.    Patient has 3 children    Patient does not work    Patient has an 11th grade education    Patient is right handed     Family History  Problem Relation Age of Onset  . Breast cancer Maternal Aunt   . Ovarian cancer Maternal Grandmother   . Breast cancer Maternal Aunt   . Migraines Neg Hx   . Diabetes Mother   . Hypertension Mother     Past  Medical History  Diagnosis Date  . Hypertension   . Asthma   . Type II diabetes mellitus (HCC)   . History of blood transfusion     "after I had one of my kids"  . Sickle cell disease (HCC)   . Anemia   . Depression   . Ovarian cyst   . Renal disorder     kidney stones  . Abscess of bursa, left elbow 06/2013    Treated with I and D/antibiotics.     Past Surgical History  Procedure Laterality Date  . Cesarean section  2000; 2007; 2011  . Dilation and curettage of uterus    . Appendectomy  2013  . Reduction mammaplasty Bilateral 1998  . Laminectomy N/A 12/13/2014    Procedure: CERVICAL LAMINECTOMY FOR INTRADURAL TUMOR;  Surgeon: Henry A Pool, MD;  Location: [facility];  Service: Neurosurgery;  Laterality: N/A;  posterior  . Carpal tunnel release      Current Outpatient Prescriptions  Medication Sig Dispense Refill  . ACCU-CHEK FASTCLIX LANCETS MISC CHECK BLOOD SUGAR 2-3 TIMES DAILY  0  . ACCU-CHEK SMARTVIEW test strip CHECK BLOOD SUGAR 2-3 TIMES DAILY  0  . Blood Glucose Monitoring Suppl (ACCU-CHEK NANO SMARTVIEW) w/Device KIT USE TO CHECK BLOOD SUGAR 2-3 TIMES DAILY  0  . HUMULIN 70/30 KWIKPEN (70-30) 100 UNIT/ML PEN Inject 30-50 Units into the skin 2 (two) times daily. 30 units in the morning  50 units at night  6  . hydrochlorothiazide (HYDRODIURIL) 25 MG tablet Take 25 mg by mouth daily.  3  . oxyCODONE (ROXICODONE) 30 MG immediate release tablet Take 1 tablet (30 mg total) by mouth every 6 (six) hours as needed for pain. 120 tablet 0  . diazepam (VALIUM) 5 MG tablet Take 1-2 pills up to 4x a day for muscle spasticity. 120 tablet 5  . DULoxetine (CYMBALTA) 60 MG capsule Take 1 capsule (60 mg total) by mouth daily. (Patient not taking: Reported on 01/02/2016) 30 capsule 11  . losartan (COZAAR) 25 MG tablet Take 25 mg by mouth daily. Reported on 01/02/2016  2   No current facility-administered medications for this visit.    Allergies as of 01/02/2016 - Review Complete  01/02/2016  Allergen Reaction Noted  . Morphine and related Itching and Nausea Only 07/11/2015  . Amoxicillin Hives and Rash 09/05/2013    Vitals: BP 144/95 mmHg  Pulse 109  Ht 5' 3" (1.6 m)  Wt 331 lb 12.8 oz (150.503 kg)  BMI 58.79 kg/m2 Last Weight:  Wt Readings from Last 1 Encounters:  01/03/16 322 lb (146.058 kg)   Last Height:     Ht Readings from Last 1 Encounters:  01/03/16 5' 3" (1.6 m)     Physical exam: Exam: Gen: NAD, conversant, well nourised, obese, well groomed  CV: RRR, no MRG. No Carotid Bruits. No peripheral edema, warm, nontender Eyes: Conjunctivae clear without exudates or hemorrhage  Neuro: Detailed Neurologic Exam  Speech:  Speech is normal; fluent and spontaneous with normal comprehension.  Cognition:  The patient is oriented to person, place, and time;   The pupils are equal, round, and reactive to light. Visual fields are full to finger confrontation. Extraocular movements are intact. Trigeminal sensation is intact and the muscles of mastication are normal. The face is symmetric. The palate elevates in the midline. Hearing intact. Voice is normal. Shoulder shrug is normal. The tongue has normal motion without fasciculations.   G ait:  Wide based, slow, keeps her knees locked to compensate for weakness  Motor Observation:  No asymmetry, no atrophy, and no involuntary movements noted. Tone:  Normal muscle tone. No spasticity   Posture:  Posture is normal while sitting   Strength:  Right UE extremities with giveway due to pain, unreliable exam today s/p CTS LUE with some mild prox weakness RLE Hip flexion and knee extension/flexion 2/5, Right 1/5 DF/PF  Left Hip flexion, knee extension and flexion 3-/5 Left 4/5 DF/PF    Sensation: decreased sensation on the right arm and leg and thorx/abd/pelvis - right sided hemisensory loss   Reflex Exam:  DTR's: brisk biceps and brachioradialis, normal  patellars (possibly slightly more brisk on the right patellar but difficult due to very large body habitus), absent AJs   Toes:  The toes are equivocal bilaterally.  Clonus:  Clonus is absent.     Assessment/Plan: Patient is an unfortunate 35 year old female with cervical laminectomy with resection of intradural intramedullary tumor ependymoma and c4 injury, tetraparesis, incontinece of bowel and bladder, spasticity and chronic pain. She has finctional deficits secondary to ependymoma with C4 cord compression and incomplete C4-C5 tetraplegia s/p decompression. She is having significant chronic pain in the right arm which is likely also due to concomitant right severe carpal tunnel syndrome s/p CTS release, chronic LBP and Hip pain, LE spasticity. She also has low back pain and radicular symptoms in the legs that predate her spinal cord tumor resection. She is mornidly obese. I highly recommend following up with Dr. Naaman Plummer for ongoing rehabilitation.   Pain: I can refill her pain meds but she will have to be seen in pain clinic for further menagaement. She says she spoke to Juluis Mire and told her she was going to ask me for a refill. I will also contact [place]. Will increase her diazepam. Referral to pain management clinic: Will refer to Pain clinic - prefer [facility] but will need to see who will take her insurance. Explained to her she needs to be seen for long-term management of narcotics. I will refill (with permission from Juluis Mire) for a few months until she gets there. Snoring and morbid obesity: Referral for sleep eval Morbid Obesity: I referred her to nutritionist. Discussed bariatric surgery. Rehab: Continue F/u with Dr. Naaman Plummer. NSY: F/u with Dr. Trenton Gammon for ongoing surveillance and management of ependymoma Neurogenic bowel and bladder management per Dr. Naaman Plummer: daily suppository for a set AM schedule. May need a mini enema also, timed voids every 2-3  hours while awake.  CC: Her current physicians: Dr. Trenton Gammon - neurosurgery Arnetha Gula, primary care and manages pain medications. She takes oxy IR 43m x  2 every 6 hours. Probably need a long-acting drug but insurance won't approve per patient Dr. Swartz - Rehab   , MD  [facility] [address] [place], [place] [address]  Phone [phone] Fax [phone]  A total of 45 minutes was spent face-to-face with this patient. Over half this time was spent on counseling patient on the c4 injury, tetraparesis, incontinece of bowel and bladder, spasticity and chronic pain. diagnosis and different diagnostic and therapeutic options available.    

## 2016-01-03 ENCOUNTER — Encounter: Payer: Medicaid Other | Attending: Neurology | Admitting: Skilled Nursing Facility1

## 2016-01-03 ENCOUNTER — Encounter: Payer: Self-pay | Admitting: Skilled Nursing Facility1

## 2016-01-03 DIAGNOSIS — Z6841 Body Mass Index (BMI) 40.0 and over, adult: Secondary | ICD-10-CM | POA: Diagnosis not present

## 2016-01-03 DIAGNOSIS — M47817 Spondylosis without myelopathy or radiculopathy, lumbosacral region: Secondary | ICD-10-CM | POA: Diagnosis not present

## 2016-01-03 NOTE — Patient Instructions (Signed)
-  30-45 grams of carbohydrate per meal (3 meals a day) -15 grams of carbohydrate per snack (2-3) -yogurt counts as a carbohydrate so you can eat it with nuts to make a good snack -A meal: carbohydrates, protein, vegetable

## 2016-01-03 NOTE — Progress Notes (Signed)
  Medical Nutrition Therapy:  Appt start time: 0900 end time:  1000.   Assessment:  Primary concerns today: referred for obesity. Pt states she was having Bad headaches then her neurologist  found a tumor on her spine. Pt arrived using a rolling walker and wincing from pain. Pt states she has not worked in 5 years. Pt states she has always been bigger. Pt states she Stopped physical therapy due to hip pain, waiting for tests to come back from hip tests. Pt states she has 3 boys: 16, 9, 5. Pt states Ever since she got a needle in her hip with "die" this week her blood sugars have been elevated to 320 when there usually 215. Pt states she has no balance. Pt states she Wants bariatric surgery but doesn't no which one. Pt States she uses measuring cups for her rice but did not know what size. Pt states she is always thirsty and always peeing. Pt staets her A1C was 7.5-8 about a year ago. Pt states she is Taking 70/30 insulin and has not spoken with anyone about her diabetes in since about 3 years ago. Pt states her Baseline is that she is always peeing and thirsty. Pt states about 2 hours after she eats her blood sugars are over 200-300. Pt states she thinks her blood sugars are out of range due to eating habits.  Dietitian advised she get a referral from her physician to a CDE for diabetes education or see an endocrinologist.   Preferred Learning Style:   Auditory  Learning Readiness:  Contemplating  MEDICATIONS: See List   DIETARY INTAKE:  Usual eating pattern includes 3 meals and 2-3 snacks per day.  Everyday foods include none stated.  Avoided foods include none stated.    24-hr recall:  B ( AM): egg, sausage, bread Snk ( AM): fiber one bar L ( PM): sadnwhich and fruit Snk ( PM): granola bar----fiber one bar D ( PM): baked chicken----fish----burger without bun, rice, vegetable  Snk ( PM): granola bar----wheat thins Beverages: juice, lipton green tea  *Meals outside the home:  5+  Usual physical activity: None due to pain  Estimated energy needs: 1600 calories  180 g carbohydrates  120 g protein 44 g fat  Progress Towards Goal(s):  In progress.   Nutritional Diagnosis:  Moca-3.3 Overweight/obesity As related to recent immobility/overconsumption calorically dense meals.  As evidenced by pt report, 24 hr recall, and BMI 57.2.    Intervention:  Nutrition counseling for obesity. Dietitian educated the pt on uncontrolled verses controlled diabetes, a brief description of the three bariatric surgeries, balanced meals, eating throughout the day, trying not to focus on the negatives of your health, and trying to continue to be mobile. Goals: -30-45 grams of carbohydrate per meal (3 meals a day) -15 grams of carbohydrate per snack (2-3) -yogurt counts as a carbohydrate so you can eat it with nuts to make a good snack -A meal: carbohydrates, protein, vegetable  Teaching Method Utilized:  Visual Auditory  Handouts given during visit include:  Snack sheet  Yellow card  Barriers to learning/adherence to lifestyle change: physical ailments  Demonstrated degree of understanding via:  Teach Back   Monitoring/Evaluation:  Dietary intake, exercise, A1C, and body weight prn.

## 2016-01-06 ENCOUNTER — Telehealth: Payer: Self-pay | Admitting: Neurology

## 2016-01-06 NOTE — Telephone Encounter (Signed)
Called patient and left her a message asking her to call me back as far as her pain mgt. Referral . Because of insurance I can send her to [facility] and there is a place [facility] in [facility] that are getting patient's in sooner.

## 2016-01-13 ENCOUNTER — Encounter: Payer: Self-pay | Admitting: Neurology

## 2016-01-13 ENCOUNTER — Ambulatory Visit (INDEPENDENT_AMBULATORY_CARE_PROVIDER_SITE_OTHER): Payer: Medicaid Other | Admitting: Neurology

## 2016-01-13 VITALS — BP 140/92 | HR 96 | Resp 20 | Ht 64.0 in | Wt 330.0 lb

## 2016-01-13 DIAGNOSIS — R0683 Snoring: Secondary | ICD-10-CM

## 2016-01-13 DIAGNOSIS — E662 Morbid (severe) obesity with alveolar hypoventilation: Secondary | ICD-10-CM

## 2016-01-13 DIAGNOSIS — F1190 Opioid use, unspecified, uncomplicated: Secondary | ICD-10-CM

## 2016-01-13 DIAGNOSIS — G4730 Sleep apnea, unspecified: Secondary | ICD-10-CM | POA: Diagnosis not present

## 2016-01-13 DIAGNOSIS — G4737 Central sleep apnea in conditions classified elsewhere: Secondary | ICD-10-CM | POA: Diagnosis not present

## 2016-01-13 DIAGNOSIS — G4710 Hypersomnia, unspecified: Secondary | ICD-10-CM | POA: Diagnosis not present

## 2016-01-13 NOTE — Patient Instructions (Signed)

## 2016-01-13 NOTE — Progress Notes (Signed)
SLEEP MEDICINE CLINIC   Provider:  Larey Seat, M D  Referring Provider: [facility] Primary Care Physician:  [facility]  Chief Complaint  Patient presents with  . New Patient (Initial Visit)    snores at night, from Dr Cynthia Rosales, had sleep study in [place] 3-4 years, doesn't know the results or when it was or who has results, rm 29, alone    HPI:  Cynthia Rosales is a 35 y.o. female , seen here as a referral  from Dr. Jaynee Rosales and Cynthia Rosales.  Chief complaint according to patient : Cynthia Rosales is seen here today in consultation for a sleep evaluation. She is a 35 year old African-American right-handed lady with an unfortunate history of a cervical level four injury, tetraparesis, bowel and bladder incontinence, spasticity and is treated for chronic pain.  Her left leg is numb and she often has severe pain. She had carpal tunnel, She has significant spasticity in both lower extremities.  Due to her inability to perform an exercises and her limited ability to ambulate she has relied on a walker now, but she has gained significant weight and is considered morbidly obese. She reports that she lives with her boyfriend, who has witnessed her to snore loudly but never commented on her stopping 2 bruits at night. In her review of systems she endorsed headaches, numbness, weakness, insomnia snoring and excessive daytime sleepiness.   Sleep habits are as follows:he patient's bedtime is 8 PM, this is usually when she goes to bed. The bedroom is cool, and quiet, has night light. She is under the influence of several medications that cause drowsiness and fatigue aside from her medical condition itself. She usually falls asleep rather promptly. She needs assistance when she has to leave the bed for example for bathroom break. She usually has to times at night the urge to urinate, sometimes 3 times. She will otherwise sleeps through the night and usually rests on her side, left  leg pain keeps her from using her left side so she usually rests on her right. It is in the sleep position that her boyfriend has noticed her to snore. She is usually uncomfortable at night -multifocal pain does interrupt her sleep. She has usually a difficult time to go back to sleep. She will rise at 6 AM , but awake earlier- at 5 AM. Waking not from an alarm, but pain.  She sleeps on 2-3  pillows and one on back and hip each. She will get an average of 5-7 hours of sleep, she reports vivid and crazy dreams- she has woken from the panic induced by dreams. Has palpitations, diaphoresis. She will get her children to the school bus.  Once up in the morning she will have breakfast, take medication but feels not rested. Feels as if she should have had 1-2 more hours of sleep/ and frequently has a headaches that is throbbing and all around forehead and temple. This HA will left as the day continues. She has woken due to ice-pick headaches before her tumor was identified, not recently. She will be sleepy around lunch time again, often takes a nap of an hour.   Sleep medical history and family sleep history: Grandmother , maternal has been snoring.  Social history:  Lives with boyfriend, has 3 sons, all in school living at home. Single mother.   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 She has finctional deficits secondary to ependymoma with C4 cord compression and incomplete C4-C5 tetraplegia s/p decompression. She is having significant chronic pain in the right arm which is likely also due to concomitant right severe carpal tunnel syndrome s/p CTS release, chronic LBP and Hip pain, LE spasticity. She also has low back pain and radicular symptoms in the legs that predate her spinal  cord tumor resection. She is mornidly obese. I highly recommend following up with Dr. Naaman Rosales for ongoing rehabilitation.   Pain: I can refill her pain meds but she will have to be seen in pain clinic for further menagaement. She says she spoke to Cynthia Rosales and told her she was going to ask me for a refill. I will also contact Cynthia Rosales. Will increase her diazepam. Referral to pain management clinic: Will refer to Pain clinic - prefer Cynthia Rosales but will need to see who will take her insurance. Explained to her she needs to be seen for long-term management of narcotics. I will refill (with permission from Cynthia Rosales) for a few months until she gets there. Snoring and morbid obesity: Referral for sleep eval Morbid Obesity: I referred her to nutritionist. Discussed bariatric surgery. Rehab: Continue F/u with Dr. Naaman Rosales. NSY: F/u with Dr. Trenton Rosales for ongoing surveillance and management of ependymoma Neurogenic bowel and bladder management per Dr. Naaman Rosales: daily suppository for a set AM schedule. May need a mini enema also, timed voids every 2-3 hours while awake.  CC:Her current physicians:Dr. Trenton Rosales - neurosurgeryMichelle Sung Rosales, primary care and manages pain medications. She takes oxy IR '15mg'$  x 2 every 6 hours. Probably need a long-acting drug but insurance won't approve per patient Dr. Naaman Rosales - Rehab Cynthia Rosales    Review of Systems: Out of a complete 14 system review, the patient complains of only the following symptoms, and all other reviewed systems are negative. See above, How likely are you to doze in the following situations: 0 = not likely, 1 = slight chance, 2 = moderate chance, 3 = high chance  Sitting and Reading? 0, Watching Television? 2 Sitting inactive in a public place (theater or meeting)?0 Lying down in the afternoon when circumstances permit?3 Sitting and talking to someone?0 Sitting quietly after lunch without alcohol?3 In a car, while  stopped for a few minutes in traffic?3 As a passenger in a car for an hour without a break?3  Total = 14 Fatigue severity score 43 , depression score 1   Social History   Social History  . Marital Status: Single    Spouse Name: N/A  . Number of Children: 3  . Years of Education: 11   Occupational History  . Unemployed     Social History Main Topics  . Smoking status: Never Smoker   . Smokeless tobacco: Never Used     Comment: 03/27/2014 "smoked ~ 1 cigarette/day; quit in ~ 2013"  . Alcohol Use: 0.0 oz/week    0 Standard drinks or equivalent per week  . Drug Use: No  . Sexual Activity: Yes   Other Topics Concern  . Not on file   Social History Narrative   Patient lives at home with boyfriend.    Patient has 3 children    Patient does not work    Patient has an 11th grade education    Patient is right handed     Family History  Problem Relation  Age of Onset  . Breast cancer Maternal Aunt   . Ovarian cancer Maternal Grandmother   . Breast cancer Maternal Aunt   . Migraines Neg Hx   . Diabetes Mother   . Hypertension Mother     Past Medical History  Diagnosis Date  . Hypertension   . Asthma   . Type II diabetes mellitus ([place])   . History of blood transfusion     "after I had one of my kids"  . Sickle cell disease ([place])   . Anemia   . Depression   . Ovarian cyst   . Renal disorder     kidney stones  . Abscess of bursa, left elbow 06/2013    Treated with I and D/antibiotics.     Past Surgical History  Procedure Laterality Date  . Cesarean section  2000; 2007; 2011  . Dilation and curettage of uterus    . Appendectomy  2013  . Reduction mammaplasty Bilateral 1998  . Laminectomy N/A 12/13/2014    Procedure: CERVICAL LAMINECTOMY FOR INTRADURAL TUMOR;  Surgeon: Charlie Pitter, Rosales;  Location: [facility];  Service: Neurosurgery;  Laterality: N/A;  posterior  . Carpal tunnel release      Current Outpatient Prescriptions  Medication Sig Dispense Refill  .  ACCU-CHEK FASTCLIX LANCETS MISC CHECK BLOOD SUGAR 2-3 TIMES DAILY  0  . ACCU-CHEK SMARTVIEW test strip CHECK BLOOD SUGAR 2-3 TIMES DAILY  0  . Blood Glucose Monitoring Suppl (ACCU-CHEK NANO SMARTVIEW) w/Device KIT USE TO CHECK BLOOD SUGAR 2-3 TIMES DAILY  0  . diazepam (VALIUM) 5 MG tablet Take 1-2 pills up to 4x a day for muscle spasticity. 120 tablet 5  . glipiZIDE (GLUCOTROL) 10 MG tablet take 1 tablet (10 mg) by oral route 2 times per day before meals  3  . HUMULIN 70/30 KWIKPEN (70-30) 100 UNIT/ML PEN Inject 30-50 Units into the skin 2 (two) times daily. 30 units in the morning  50 units at night  6  . hydrochlorothiazide (HYDRODIURIL) 25 MG tablet Take 25 mg by mouth daily.  3  . Iron Oxide Yellow PSTE 65 mg by Does not apply route.    Marland Kitchen oxyCODONE (ROXICODONE) 30 MG immediate release tablet Take 1 tablet (30 mg total) by mouth every 6 (six) hours as needed for pain. 120 tablet 0  . DULoxetine (CYMBALTA) 60 MG capsule Take 1 capsule (60 mg total) by mouth daily. (Patient not taking: Reported on 01/02/2016) 30 capsule 11  . losartan (COZAAR) 25 MG tablet Take 25 mg by mouth daily. Reported on 01/13/2016  2   No current facility-administered medications for this visit.    Allergies as of 01/13/2016 - Review Complete 01/13/2016  Allergen Reaction Noted  . Morphine and related Itching and Nausea Only 07/11/2015  . Amoxicillin Hives and Rash 09/05/2013    Vitals: BP 140/92 mmHg  Pulse 96  Resp 20  Ht '5\' 4"'$  (1.626 m)  Wt 330 lb (149.687 kg)  BMI 56.62 kg/m2 Last Weight:  Wt Readings from Last 1 Encounters:  01/13/16 330 lb (149.687 kg)   HYQ:MVHQ mass index is 56.62 kg/(m^2).     Last Height:   Ht Readings from Last 1 Encounters:  01/13/16 '5\' 4"'$  (1.626 m)    Physical exam:  General: The patient is awake, alert and appears not in acute distress. The patient is well groomed. Head: Normocephalic, atraumatic. Neck is supple. Mallampati 3  neck circumference:16. 25 . Nasal airflow  unrestricted , TMJ is  not  evident . Retrognathia is seen.  Cardiovascular:  Regular rate and rhythm , without  murmurs or carotid bruit, and without distended neck veins. Respiratory: Lungs are clear to auscultation. Skin:  Without evidence of edema, or rash Trunk: BMI is elevated- morbidly obese. The patient's posture is leaning forward. Ambulates with a walker.   Neurologic exam : The patient is awake and alert, oriented to place and time.   Memory subjective  described as intact.  Attention span & concentration ability appears normal.  Speech is fluent,  Without  dysarthria, dysphonia or aphasia.  Mood and affect are appropriate.  Cranial nerves: Pupils are equal and briskly reactive to light. Funduscopic exam without evidence of pallor or edema. Extraocular movements  in vertical and horizontal planes intact and without nystagmus. Visual fields by finger perimetry are intact. Hearing to finger rub intact.   Facial sensation intact to fine touch.  Facial motor strength is symmetric and tongue and uvula move midline. Shoulder shrug was symmetrical, but weak.   Motor exam:  spasticity in all extremities, mainly lower. Grip strength loss. .   Sensory:  Fine touch, pinprick and vibration were affected below C 5  Coordination: Rapid alternating movements were slowed, right over left .  Finger-to-nose maneuver  normal without evidence of ataxia, dysmetria or tremor.    The patient was advised of the nature of the diagnosed sleep disorder , the treatment options and risks for general a health and wellness arising from not treating the condition.  I spent more than  40 minutes of face to face time with the patient. Greater than 50% of time was spent in counseling and coordination of care. We have discussed the diagnosis and differential and I answered the patient's questions.     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 She was discharged postsurgically in February 2016 with tetraplegia, hypertension, morbid obesity, CV spinal canal injury, diabetes mellitus, postoperative ileus, she was admitted to rehabilitation for inpatient therapy. She required minimal assistance initially with mobility and pass of daily living she improved a posture as well as her functional use of her bilateral upper and lower extremities. She did lose sensation from her hips down legs are giving out which caused her to fall and she is now using a push walker she often feels burning and sharp pain in her right arm and her right hand is clumsy the lower part of her back also hurts.  Notes from Dr. Trenton Rosales, her neurosurgeon, state that the patient is permanently and totally disabled and therefore he supports her claim for disability.  2) Cynthia Rosales has continued to gain weight partially due to her inability to exercise and ambulate. She is very restricted in her physical activities but she is leaving the house, Riki Sheer not driving herself. Given the morbid obesity and the reported snoring she is at a very high risk of obstructive sleep apnea but she has additional risk due to the narcotic pain medication and muscle relaxants taken for spasticity to develop central apnea. She is also at risk of obesity hypoventilation, a disorder that would cause her to retain CO2 and  become hypoxemic at night.she reported sleep related headaches .        Plan:  Treatment plan and additional workup : It is for this reason that this patient needs a attended in lab sleep study that I will order as a split-night polysomnography with capnography. RV  after PSG.    Cynthia Rosales Shayle Donahoo Rosales  01/13/2016   CC: [facility] [address] [place], [place] [address]

## 2016-02-02 ENCOUNTER — Ambulatory Visit (INDEPENDENT_AMBULATORY_CARE_PROVIDER_SITE_OTHER): Payer: Medicaid Other | Admitting: Neurology

## 2016-02-02 DIAGNOSIS — G4730 Sleep apnea, unspecified: Secondary | ICD-10-CM

## 2016-02-02 DIAGNOSIS — G4710 Hypersomnia, unspecified: Secondary | ICD-10-CM

## 2016-02-02 DIAGNOSIS — F1190 Opioid use, unspecified, uncomplicated: Secondary | ICD-10-CM

## 2016-02-02 DIAGNOSIS — E662 Morbid (severe) obesity with alveolar hypoventilation: Secondary | ICD-10-CM

## 2016-02-02 DIAGNOSIS — Z79891 Long term (current) use of opiate analgesic: Secondary | ICD-10-CM

## 2016-02-02 DIAGNOSIS — G4737 Central sleep apnea in conditions classified elsewhere: Secondary | ICD-10-CM

## 2016-02-02 DIAGNOSIS — R0683 Snoring: Secondary | ICD-10-CM

## 2016-02-02 NOTE — Sleep Study (Signed)
Please see the scanned sleep study interpretation located in the Procedure tab within the Chart Review section. 

## 2016-02-13 ENCOUNTER — Telehealth: Payer: Self-pay

## 2016-02-13 NOTE — Telephone Encounter (Signed)
Spoke to pt and advised her that her sleep study did not reveal any significant sleep apnea resulting in significant sleep disruption. There were frequent PLMS of sleep with associated sleep disruption. I advised pt that follow up appt with Dr. Brett Fairy could be made to discuss treatment of PLMs, and maybe check iron levels to see if she had RLS. Pt declined an appt with Dr. Brett Fairy, she says that she is "tired of taking all these medications". I advised her to sleep on her side and lose weight, diet, and exercise if not contraindicated by her other physicians. I advised her to not driving or operate hazardous machinery when sleepy. I advised her to optimize pain control through pain management if she awakens from pain in her sleep. Pt verbalized understanding. Pt states that she will just follow up with Dr. Jaynee Eagles and not Dr. Brett Fairy since she does not want treatment for the PLMS at this time. I encouraged her to call us back and ask for an appt with Dr. Brett Fairy if she changes her mind. I reminded pt of her appt with Dr. Jaynee Eagles on 03/18/16 at 9:30. Pt verbalized understanding.

## 2016-02-26 ENCOUNTER — Emergency Department (HOSPITAL_COMMUNITY): Payer: Medicaid Other

## 2016-02-26 ENCOUNTER — Emergency Department (HOSPITAL_COMMUNITY)
Admission: EM | Admit: 2016-02-26 | Discharge: 2016-02-27 | Disposition: A | Discharge status: Home / Self Care | Payer: Medicaid Other | Attending: Emergency Medicine | Admitting: Emergency Medicine

## 2016-02-26 ENCOUNTER — Encounter (HOSPITAL_COMMUNITY): Payer: Self-pay | Admitting: Emergency Medicine

## 2016-02-26 DIAGNOSIS — Z880 Allergy status to penicillin: Secondary | ICD-10-CM | POA: Insufficient documentation

## 2016-02-26 DIAGNOSIS — G8929 Other chronic pain: Secondary | ICD-10-CM | POA: Insufficient documentation

## 2016-02-26 DIAGNOSIS — Z3202 Encounter for pregnancy test, result negative: Secondary | ICD-10-CM | POA: Insufficient documentation

## 2016-02-26 DIAGNOSIS — Z87448 Personal history of other diseases of urinary system: Secondary | ICD-10-CM | POA: Insufficient documentation

## 2016-02-26 DIAGNOSIS — Z7984 Long term (current) use of oral hypoglycemic drugs: Secondary | ICD-10-CM | POA: Diagnosis not present

## 2016-02-26 DIAGNOSIS — F329 Major depressive disorder, single episode, unspecified: Secondary | ICD-10-CM | POA: Diagnosis not present

## 2016-02-26 DIAGNOSIS — Z8742 Personal history of other diseases of the female genital tract: Secondary | ICD-10-CM | POA: Diagnosis not present

## 2016-02-26 DIAGNOSIS — M25552 Pain in left hip: Secondary | ICD-10-CM

## 2016-02-26 DIAGNOSIS — I10 Essential (primary) hypertension: Secondary | ICD-10-CM | POA: Insufficient documentation

## 2016-02-26 DIAGNOSIS — Z79899 Other long term (current) drug therapy: Secondary | ICD-10-CM | POA: Insufficient documentation

## 2016-02-26 DIAGNOSIS — Z87442 Personal history of urinary calculi: Secondary | ICD-10-CM | POA: Insufficient documentation

## 2016-02-26 DIAGNOSIS — E119 Type 2 diabetes mellitus without complications: Secondary | ICD-10-CM | POA: Diagnosis not present

## 2016-02-26 DIAGNOSIS — J45909 Unspecified asthma, uncomplicated: Secondary | ICD-10-CM | POA: Diagnosis not present

## 2016-02-26 LAB — I-STAT BETA HCG BLOOD, ED (MC, WL, AP ONLY): I-stat hCG, quantitative: <5 m[IU]/mL (ref <5)

## 2016-02-26 MED ORDER — DIAZEPAM 5 MG PO TABS
5.0000 mg | ORAL_TABLET | Freq: Once | ORAL | Status: AC
Start: 2016-02-26 — End: 2016-02-26
  Administered 2016-02-26: 5 mg via ORAL
  Filled 2016-02-26: qty 1

## 2016-02-26 MED ORDER — HYDROMORPHONE HCL 1 MG/ML IJ SOLN: 2.0000 mg | Freq: Once | INTRAMUSCULAR | Status: DC

## 2016-02-26 MED ORDER — SODIUM CHLORIDE 0.9 % IV SOLN
INTRAVENOUS | Status: DC
  Administered 2016-02-26: 17:00:00 via INTRAVENOUS

## 2016-02-26 MED ORDER — HYDROMORPHONE HCL 1 MG/ML IJ SOLN
1.0000 mg | Freq: Once | INTRAMUSCULAR | Status: AC
Start: 2016-02-26 — End: 2016-02-26
  Administered 2016-02-26: 1 mg via INTRAVENOUS
  Filled 2016-02-26: qty 1

## 2016-02-26 MED ORDER — CYCLOBENZAPRINE HCL 10 MG PO TABS: 10.0000 mg | ORAL_TABLET | Freq: Three times a day (TID) | ORAL | Status: DC | PRN

## 2016-02-26 MED ORDER — OXYCODONE-ACETAMINOPHEN 5-325 MG PO TABS: 1.0000 | ORAL_TABLET | Freq: Four times a day (QID) | ORAL | Status: DC | PRN

## 2016-02-26 MED ORDER — ONDANSETRON HCL 4 MG/2ML IJ SOLN
4.0000 mg | Freq: Once | INTRAMUSCULAR | Status: AC
  Administered 2016-02-26: 4 mg via INTRAVENOUS
  Filled 2016-02-26: qty 2

## 2016-02-26 MED ORDER — HYDROMORPHONE HCL 1 MG/ML IJ SOLN
1.0000 mg | Freq: Once | INTRAMUSCULAR | Status: AC
  Administered 2016-02-26: 1 mg via INTRAVENOUS
  Filled 2016-02-26: qty 1

## 2016-02-26 MED ORDER — FENTANYL CITRATE (PF) 100 MCG/2ML IJ SOLN
100.0000 ug | Freq: Once | INTRAMUSCULAR | Status: AC
  Administered 2016-02-26: 100 ug via INTRAVENOUS
  Filled 2016-02-26: qty 2

## 2016-02-26 NOTE — ED Notes (Signed)
Pt reports that she had multiple falls over the last month. Pt reports left hip pain due to the falls. Pt reports recent MRI showing torn cartilage in the left hip. 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 Pt alert x4.

## 2016-02-26 NOTE — ED Notes (Signed)
Heat compresses applied to left hip where pain is

## 2016-02-26 NOTE — Discharge Instructions (Signed)
Return here as needed.  Follow-up with your orthopedist as soon as possible.  The CT scan did not show any abnormality

## 2016-02-26 NOTE — ED Notes (Signed)
Walked in room and patient complains of n/v. Irena Cords notified and is to order zofran

## 2016-02-26 NOTE — ED Notes (Signed)
CT here for pt, pt asked to be assisted to bedside commode. Pt assisted to bedside commode by this RN and back to bed. Pt asking for pain medication. Christ Lawyer notified and is to put in pain medication order.

## 2016-02-28 NOTE — ED Provider Notes (Signed)
CSN: [id number]     Arrival date & time 02/26/16  1125 History   First MD Initiated Contact with Patient 02/26/16 1526     Chief Complaint  Patient presents with  . Hip Pain     (Consider location/radiation/quality/duration/timing/severity/associated sxs/prior Treatment) HPI Patient presents to the emergency department with left hip pain that has been chronic.  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  The patient states she had a recent MRI by her orthopedist that showed this.  She states that she has had recent falls and that is what brings her to the hospital for evaluation from these falls.  The patient states that it seems that her hip is worse following the fall.  The patient states that she did not take any medications other than what she is prescribed prior to arrival. The patient denies chest pain, shortness of breath, headache,blurred vision, neck pain, fever, cough, weakness, numbness, dizziness, anorexia, edema, abdominal pain, nausea, vomiting, diarrhea, rash, back pain, dysuria, hematemesis, bloody stool, near syncope, or syncope. Past Medical History  Diagnosis Date  . Hypertension   . Asthma   . Type II diabetes mellitus ([place])   . History of blood transfusion     "after I had one of my kids"  . Sickle cell disease ([place])   . Anemia   . Depression   . Ovarian cyst   . Renal disorder     kidney stones  . Abscess of bursa, left elbow 06/2013    Treated with I and D/antibiotics.    Past Surgical History  Procedure Laterality Date  . Cesarean section  2000; 2007; 2011  . Dilation and curettage of uterus    . Appendectomy  2013  . Reduction mammaplasty Bilateral 1998  . Laminectomy N/A 12/13/2014    Procedure: CERVICAL LAMINECTOMY FOR INTRADURAL TUMOR;  Surgeon: Charlie Pitter, MD;  Location: [facility];  Service: Neurosurgery;  Laterality: N/A;  posterior  . Carpal tunnel release     Family History  Problem Relation Age of Onset  . Breast  cancer Maternal Aunt   . Ovarian cancer Maternal Grandmother   . Breast cancer Maternal Aunt   . Migraines Neg Hx   . Diabetes Mother   . Hypertension Mother    Social History  Substance Use Topics  . Smoking status: Never Smoker   . Smokeless tobacco: Never Used     Comment: 03/27/2014 "smoked ~ 1 cigarette/day; quit in ~ 2013"  . Alcohol Use: 0.0 oz/week    0 Standard drinks or equivalent per week   OB History    Gravida Para Term Preterm AB TAB SAB Ectopic Multiple Living   1              Review of Systems All other systems negative except as documented in the HPI. All pertinent positives and negatives as reviewed in the HPI.  Allergies  Morphine and related and Amoxicillin  Home Medications   Prior to Admission medications   Medication Sig Start Date End Date Taking? Authorizing Provider  ACCU-CHEK FASTCLIX LANCETS MISC CHECK BLOOD SUGAR 2-3 TIMES DAILY 12/03/15  Yes Historical Provider, MD  ACCU-CHEK SMARTVIEW test strip CHECK BLOOD SUGAR 2-3 TIMES DAILY 12/03/15  Yes Historical Provider, MD  Blood Glucose Monitoring Suppl (ACCU-CHEK NANO SMARTVIEW) w/Device KIT USE TO CHECK BLOOD SUGAR 2-3 TIMES DAILY 12/04/15  Yes Historical Provider, MD  diazepam (VALIUM) 5 MG tablet Take 1-2 pills up to 4x a day for  muscle spasticity. 01/02/16  Yes Melvenia Beam, MD  glipiZIDE (GLUCOTROL) 10 MG tablet take 1 tablet (10 mg) by oral route 2 times per day before meals 01/08/16  Yes Historical Provider, MD  hydrochlorothiazide (HYDRODIURIL) 25 MG tablet Take 25 mg by mouth daily. 08/13/15  Yes Historical Provider, MD  losartan (COZAAR) 25 MG tablet Take 25 mg by mouth daily. Reported on 01/13/2016 02/27/15  Yes Historical Provider, MD  NOVOLOG 100 UNIT/ML injection IF SUGAR IS 151-200 INJECT 4 UNITS 201-250 INJECT 6 UNITS 251-300 INJECT 8 UNITS 301-350 INJECT 10 UNITS LESS THEN 150 DO NOT TAKE ANY INSUL 02/03/16  Yes Historical Provider, MD  oxyCODONE (ROXICODONE) 30 MG immediate release tablet Take 1  tablet (30 mg total) by mouth every 6 (six) hours as needed for pain. 01/02/16  Yes Melvenia Beam, MD  cyclobenzaprine (FLEXERIL) 10 MG tablet Take 1 tablet (10 mg total) by mouth 3 (three) times daily as needed for muscle spasms. 02/26/16   Dalia Heading, PA-C  DULoxetine (CYMBALTA) 60 MG capsule Take 1 capsule (60 mg total) by mouth daily. Patient not taking: Reported on 01/02/2016 09/12/15   Melvenia Beam, MD  Iron Oxide Yellow PSTE 65 mg by Does not apply route.    Historical Provider, MD  oxyCODONE-acetaminophen (PERCOCET/ROXICET) 5-325 MG tablet Take 1 tablet by mouth every 6 (six) hours as needed for severe pain. 02/26/16   Kymberlee Viger, PA-C   BP 127/85 mmHg  Pulse 87  Temp(Src) 97.9 F (36.6 C) (Oral)  Resp 18  SpO2 100%  LMP  Physical Exam  Constitutional: She is oriented to person, place, and time. She appears well-developed and well-nourished. No distress.  HENT:  Head: Normocephalic and atraumatic.  Mouth/Throat: Oropharynx is clear and moist.  Eyes: Pupils are equal, round, and reactive to light.  Neck: Normal range of motion. Neck supple.  Cardiovascular: Normal rate, regular rhythm and normal heart sounds.  Exam reveals no gallop and no friction rub.   No murmur heard. Pulmonary/Chest: Effort normal and breath sounds normal. No respiratory distress. She has no wheezes.  Abdominal: Soft. Bowel sounds are normal. She exhibits no distension. There is no tenderness.  Musculoskeletal:       Left hip: She exhibits decreased range of motion, decreased strength and tenderness. She exhibits no bony tenderness, no swelling, no crepitus, no deformity and no laceration.  Neurological: She is alert and oriented to person, place, and time. She exhibits normal muscle tone. Coordination normal.  Skin: Skin is warm and dry. No rash noted. No erythema.  Psychiatric: She has a normal mood and affect. Her behavior is normal.  Nursing note and vitals reviewed.   ED Course   Procedures (including critical care time) Labs Review Labs Reviewed  I-STAT BETA HCG BLOOD, ED ([facility], [facility], [facility] ONLY)    Imaging Review Ct Hip Left Wo Contrast  02/26/2016  CLINICAL DATA:  Multiple recent falls, with left hip pain. Initial encounter. EXAM: CT OF THE LEFT HIP WITHOUT CONTRAST TECHNIQUE: Multidetector CT imaging of the left hip was performed according to the standard protocol. Multiplanar CT image reconstructions were also generated. COMPARISON:  Left hip radiographs performed 09/25/2015 FINDINGS: There is no evidence of fracture or dislocation. The left hip joint is grossly unremarkable in appearance. No significant joint space narrowing seen. Mild vacuum phenomenon is noted at the left sacroiliac joint. Evaluation for a cartilage defect, as clinically described, is limited on CT. The visualized soft tissues are grossly unremarkable in appearance. No  soft tissue hematoma is seen. IMPRESSION: No evidence of fracture or dislocation. Electronically Signed   By: Garald Balding M.D.   On: 02/26/2016 22:51   I have personally reviewed and evaluated these images and lab results as part of my medical decision-making.  She is pain is chronic in nature.  She is being followed by her orthopedist.  I advised her to follow-up with them for further evaluation.  CT scan of the hip was done to evaluate for any possible fractures.  She is advised of results and all questions were answered   Dalia Heading, PA-C 02/28/16 [id number]  Sherwood Gambler, MD 03/03/16 [phone]

## 2016-03-18 ENCOUNTER — Encounter: Payer: Self-pay | Admitting: Neurology

## 2016-03-18 ENCOUNTER — Ambulatory Visit (INDEPENDENT_AMBULATORY_CARE_PROVIDER_SITE_OTHER): Payer: Medicaid Other | Admitting: Neurology

## 2016-03-18 VITALS — BP 147/95 | HR 90 | Ht 64.0 in | Wt 332.6 lb

## 2016-03-18 DIAGNOSIS — G8250 Quadriplegia, unspecified: Secondary | ICD-10-CM | POA: Diagnosis not present

## 2016-03-18 DIAGNOSIS — S14104S Unspecified injury at C4 level of cervical spinal cord, sequela: Secondary | ICD-10-CM

## 2016-03-18 DIAGNOSIS — R258 Other abnormal involuntary movements: Secondary | ICD-10-CM

## 2016-03-18 DIAGNOSIS — R32 Unspecified urinary incontinence: Secondary | ICD-10-CM | POA: Diagnosis not present

## 2016-03-18 DIAGNOSIS — G894 Chronic pain syndrome: Secondary | ICD-10-CM | POA: Diagnosis not present

## 2016-03-18 MED ORDER — CYCLOBENZAPRINE HCL 10 MG PO TABS: 10.0000 mg | ORAL_TABLET | Freq: Three times a day (TID) | ORAL | Status: DC | PRN

## 2016-03-18 NOTE — Patient Instructions (Signed)
Remember to drink plenty of fluid, eat healthy meals and do not skip any meals. Try to eat protein with a every meal and eat a healthy snack such as fruit or nuts in between meals. Try to keep a regular sleep-wake schedule and try to exercise daily, particularly in the form of walking, 20-30 minutes a day, if you can.   I would like to see you back in 3-4 months, sooner if we need to. Please call us with any interim questions, concerns, problems, updates or refill requests.   Our phone number is [phone]. We also have an after hours call service for urgent matters and there is a physician on-call for urgent questions. For any emergencies you know to call 911 or go to the nearest emergency room

## 2016-03-18 NOTE — Progress Notes (Signed)
[facility]    Provider:  Dr Jaynee Eagles Referring Provider: Medicine, [facility] Primary Care Physician:  [facility]  CC: c4 injury, tetraparesis, incontinece of bowel and bladder, spasticity and chronic pain.  Interval history 03/19/2016: She saw her orthopaedic doctor and was told she needs surgery on the hips. She fell again and has been in bed for 2 weeks. She did the sleep study which did not show  OSA. She has torn cartilage in the left hip and needs surgery per patient, she says the bone rubbing together. She sees Dr. Delilah Shan at [facility]. She was called by pain clinic and has not been able to go. She missed one appointment and has not rescheduled. I encouraged her to reschedule because she needs a pain management specialist. She is crying in the office. The pain is in her hip and is excruciating. Nothing is helping her. She was given percocet and flexeril at the ED. She still has refills on the diazepam. She is not seeing Dr. Naaman Plummer, I have encouraged her to keep seeing him. She has tried the injections into the hips. Will request Alvis Lemmings, social services, physical therapy and occupational therapy and a home nurse.She has help at home through [facility] for 2 hours a day. She is trying to get disability, she has a Chief Executive Officer. She has looked into getting a mobility chair via [facility] and it is too expensive but they gave her a shower chair and a walker with a seat.   Per notes from sleep study " Spoke to pt and advised her that her sleep study did not reveal any significant sleep apnea resulting in significant sleep disruption. There were frequent PLMS of sleep with associated sleep disruption. I advised pt that follow up appt with Dr. Brett Fairy could be made to discuss treatment of PLMs, and maybe check iron levels to see if she had RLS. Pt declined an appt with Dr. Brett Fairy, she says that she is "tired of taking all these medications". I  advised her to sleep on her side and lose weight, diet, and exercise if not contraindicated by her other physicians. I advised her to not driving or operate hazardous machinery when sleepy. I advised her to optimize pain control through pain management if she awakens from pain in her sleep. Pt verbalized understanding. Pt states that she will just follow up with Dr. Jaynee Eagles and not Dr. Brett Fairy since she does not want treatment for the PLMS at this time. I encouraged her to call us back and ask for an appt with Dr. Brett Fairy if she changes her mind. I reminded pt of her appt with Dr. Jaynee Eagles on 03/18/16 at 9:30. Pt verbalized understanding."  Interval history 01/02/2016: Laquita Harlan is a 35 y.o. female here as a follow up. Patient is an unfortunate 34 year old female with cervical laminectomy with resection of intradural intramedullary tumor ependymoma and c4 injury, tetraparesis, incontinece of bowel and bladder, spasticity and chronic pain.  She is having significant pain and spasticity. She had a hip procedure the 31st (2 days ago) and since then her left leg is numb with severe pain. She is also have significant muscle spasticity in the lower legs, chronic low back pain, she is morbidly obese. Diazepam '5mg'$  twice daily helps but she feel she could use it more often than that. No side effects. Will increase diazepam for muscle spasticity. Warned against addicition, sedation with respratory depression, don't combine with other pain medications. She is exhausted during the  day, snoring a lot at night.. She has morning headaches. She is morbidly obese. She is exhausted. She is fatigued. She can fall asleep easily. She wakes herslf up snoring. She is taking 2 x '15mg'$  Oxy IR every 6 hours. I will refill but she need pain management. Will refill, will talk to Juluis Mire and refer to pain management. Will order a sleep evaluation for OSA.   Her current physicians: Dr. Trenton Gammon - neurosurgery Arnetha Gula,  manages pain medications. She takes oxy IR '15mg'$  x 2 every 6 hours. Probably need a long-acting drug but insurance won't approve per patient Dr. Naaman Plummer - Rehab Will refer to Pain clinic - prefer [facility] but will need to see who will take her insurance. Explained to her I can refill her pain meds but she will have to be seen in pain clinic for further menagaement. She says she spoke to Juluis Mire and told her she was going to ask me for a refill. I will also contact [facility].   Interval history 07/11/2015:Patient is an unfortunate 35 year old female with cervical laminectomy with resection of intradural intramedullary tumor ependymoma and subsequent cervical spine injury at C4. Dr. Trenton Gammon recommended complete and total disability and they declined her. She is incontinent or bowel and bladder. She is embarassed, she cries. She can't stand without using her arms. She has swelling in the legs and feels like she is walking on a balloon. She uses a walker. She has to keep her knees locked to walk. She has no balance. It hurts from the hips to the toes. She has numbness and tingling in her arms. Her whole right arm is numb, the fingertips are cold and numb. She has hand weakness and can't make a fist due to weakness. It is burning and sharp with pain. Her legs are totally stiff. She can't lift up her legs, totally stiff. She has bilateral CTS which was put off since they found the tumor. She has sharp pain in the hips and lower back. She has pain shooting in her hips. Sharp radiating pains. She is on oxycodone and may be on pain medication chronically. She has been seeing an NP but doesn' t have a primary care doctor. She is itching a lot from the morphine. She tried Lyrica and it didn't work, made her stomach hurt. Neurontin '300mg'$  not helping. No side effects from the gabapentin. EMG in the past showed severe carpal tunnel syndrome on the right and moderately severe on the left.  MRI cervical spine  04/15/2015;  Interval laminectomy for tumor resection of an ependymoma extending from C3-4 to C5. Slight enlargement of the cord diameter at the level of surgery, compared to the normal cord diameter. Very low level enhancement at the previous site of the tumor. The study is ndeterminate for postoperative change versus residual/recurrent tumor. Follow-up study in 2-3 months is suggested.  :  Abnormal MRI cervical spine 12/05/2014 (with and without) demonstrating: 1. There is intradural intramedullary cystic, expansile lesion within the spinal cord at C4-C5 levels measuring 0.9x0.8x2.2cm (APxtransxSI) with internal webs/septations. Concerning for neoplastic process. Infectious, vascular or traumatic syringomyelia etiologies less likely. Consider serial imaging with post-contrast sequences for further evaluation. 2. Remainder of of spinal cord unremarkable.  EMG/NCS 09/25/2014: There is electrophysiologic evidence for a severe right Carpal Tunnel Syndrome and a moderately-severe left Carpal Tunnel Syndrome. Needle examination shows acute/ongoing denervation in right lower-cervical paraspinal muscles which is consistent with cervical radiculopathy. The affected cervical root cannot be localized by  paraspinal level but does raise the possibility of a double crush syndrome. Consider MRI of the cervical spine as clinically warranted  Notes from Dr. Trenton Gammon: Follow-up MRI scan of the patient's cervical spine demonstrates evidence of cervical laminectomy and resection of her intradural intramedullary tumor without obvious recurrent disease. The spinal cord itself appears to be healing well. Dr. Trenton Gammon states that the patient is permanently and totally disabled, this is likely permanent.    Interval history 06/19/2015: Zaylah Blecha is a 34 y.o. female here as a follow up. 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 She was discharged in February  of 2016 with discharge diagnoses of tetrplegia, HTN, ependymoma, c4 spinal cord injury, DM2, postoperative ileus, morbid obesity, SSD. She was admitted to rehabilitation in January 2016 for inpatient therapy. Notes state that at admission, patient required minimal assistance with mobility and ADL tasks. She improved during rehabilitation and activity tolerance, balance, posture control as well as ability to compensate for deficits. She had improvement in functional use of her bilateral upper and lower extremities.   She has no feeling from her hips down, her legs are giving out on her with falls. Her legs are cold from the knees down, her feet are cold all the time. Her right side still has numbness. If she picks up a pen, she has pain in her arms. She feels burning and sharp pain in her right arm. Dr. Shann Medal that she has been told she has severe nerve damage. The lower part of her back hurts. She is in chronic pain. He right eye is still blurry. The right side of her face is numb and sometimes she can't feel her ear. The back of the neck hurts. Her legs are giving out. She is in bed 4-5 days at a time. Sometimes she can't get out of the bed. She can't walk. She is crying profusely in the office today.   Review of Systems: Patient complains of symptoms per HPI as well as the following symptoms: no CP, no SOB. Pertinent negatives per HPI. All others negative.   Social History   Social History  . Marital Status: Single    Spouse Name: N/A  . Number of Children: 3  . Years of Education: 11   Occupational History  . Unemployed     Social History Main Topics  . Smoking status: Never Smoker   . Smokeless tobacco: Never Used     Comment: 03/27/2014 "smoked ~ 1 cigarette/day; quit in ~ 2013"  . Alcohol Use: 0.0 oz/week    0 Standard drinks or equivalent per week  . Drug Use: No  . Sexual Activity: Yes   Other Topics Concern  . Not on file   Social History Narrative   Patient lives at home  with boyfriend.    Patient has 3 children    Patient does not work    Patient has an 11th grade education    Patient is right handed     Family History  Problem Relation Age of Onset  . Breast cancer Maternal Aunt   . Ovarian cancer Maternal Grandmother   . Breast cancer Maternal Aunt   . Migraines Neg Hx   . Diabetes Mother   . Hypertension Mother     Past Medical History  Diagnosis Date  . Hypertension   . Asthma   . Type II diabetes mellitus ([place])   . History of blood transfusion     "  after I had one of my kids"  . Sickle cell disease ([place])   . Anemia   . Depression   . Ovarian cyst   . Renal disorder     kidney stones  . Abscess of bursa, left elbow 06/2013    Treated with I and D/antibiotics.     Past Surgical History  Procedure Laterality Date  . Cesarean section  2000; 2007; 2011  . Dilation and curettage of uterus    . Appendectomy  2013  . Reduction mammaplasty Bilateral 1998  . Laminectomy N/A 12/13/2014    Procedure: CERVICAL LAMINECTOMY FOR INTRADURAL TUMOR;  Surgeon: Charlie Pitter, MD;  Location: [facility];  Service: Neurosurgery;  Laterality: N/A;  posterior  . Carpal tunnel release      Current Outpatient Prescriptions  Medication Sig Dispense Refill  . ACCU-CHEK FASTCLIX LANCETS MISC CHECK BLOOD SUGAR 2-3 TIMES DAILY  0  . ACCU-CHEK SMARTVIEW test strip CHECK BLOOD SUGAR 2-3 TIMES DAILY  0  . Blood Glucose Monitoring Suppl (ACCU-CHEK NANO SMARTVIEW) w/Device KIT USE TO CHECK BLOOD SUGAR 2-3 TIMES DAILY  0  . diazepam (VALIUM) 5 MG tablet Take 1-2 pills up to 4x a day for muscle spasticity. 120 tablet 5  . DULoxetine (CYMBALTA) 60 MG capsule Take 1 capsule (60 mg total) by mouth daily. 30 capsule 11  . glipiZIDE (GLUCOTROL) 10 MG tablet take 1 tablet (10 mg) by oral route 2 times per day before meals  3  . hydrochlorothiazide (HYDRODIURIL) 25 MG tablet Take 25 mg by mouth daily.  3  . Iron Oxide Yellow PSTE 65 mg by Does not apply route.    Marland Kitchen  losartan (COZAAR) 25 MG tablet Take 25 mg by mouth daily. Reported on 01/13/2016  2  . NOVOLOG 100 UNIT/ML injection IF SUGAR IS 151-200 INJECT 4 UNITS 201-250 INJECT 6 UNITS 251-300 INJECT 8 UNITS 301-350 INJECT 10 UNITS LESS THEN 150 DO NOT TAKE ANY INSUL  3  . oxyCODONE (ROXICODONE) 30 MG immediate release tablet Take 1 tablet (30 mg total) by mouth every 6 (six) hours as needed for pain. 120 tablet 0  . cyclobenzaprine (FLEXERIL) 10 MG tablet Take 1 tablet (10 mg total) by mouth 3 (three) times daily as needed for muscle spasms. (Patient not taking: Reported on 03/18/2016) 15 tablet 0  . oxyCODONE-acetaminophen (PERCOCET/ROXICET) 5-325 MG tablet Take 1 tablet by mouth every 6 (six) hours as needed for severe pain. (Patient not taking: Reported on 03/18/2016) 15 tablet 0   No current facility-administered medications for this visit.    Allergies as of 03/18/2016 - Review Complete 03/18/2016  Allergen Reaction Noted  . Morphine and related Itching and Nausea Only 07/11/2015  . Amoxicillin Hives and Rash 09/05/2013    Vitals: BP 147/95 mmHg  Pulse 90  Ht '5\' 4"'$  (1.626 m)  Wt 332 lb 9.6 oz (150.866 kg)  BMI 57.06 kg/m2 Last Weight:  Wt Readings from Last 1 Encounters:  03/18/16 332 lb 9.6 oz (150.866 kg)   Last Height:   Ht Readings from Last 1 Encounters:  03/18/16 '5\' 4"'$  (1.626 m)    Speech:  Speech is normal; fluent and spontaneous with normal comprehension.  Cognition:  The patient is oriented to person, place, and time;   The pupils are equal, round, and reactive to light. Visual fields are full to finger confrontation. Extraocular movements are intact. Trigeminal sensation is intact and the muscles of mastication are normal. The face is symmetric. The palate elevates  in the midline. Hearing intact. Voice is normal. Shoulder shrug is normal. The tongue has normal motion without fasciculations.   G ait:  Wide based, slow, keeps her knees locked to compensate for  weakness  Motor Observation:  No asymmetry, no atrophy, and no involuntary movements noted. Tone:  Normal muscle tone. No spasticity   Posture:  Posture is normal while sitting   Strength:  Right UE extremities with giveway due to pain, unreliable exam today s/p CTS LUE with some mild prox weakness RLE Hip flexion and knee extension/flexion 2/5, Right 1/5 DF/PF  Left Hip flexion, knee extension and flexion 3-/5 Left 4/5 DF/PF    Sensation: decreased sensation on the right arm and leg and thorx/abd/pelvis - right sided hemisensory loss   Reflex Exam:  DTR's: brisk biceps and brachioradialis, normal patellars (possibly slightly more brisk on the right patellar but difficult due to very large body habitus), absent AJs   Toes:  The toes are equivocal bilaterally.  Clonus:  Clonus is absent.    Assessment/Plan: Patient is an unfortunate 35 year old female with cervical laminectomy with resection of intradural intramedullary tumor ependymoma and c4 injury, tetraparesis, incontinece of bowel and bladder, spasticity and chronic pain. She has finctional deficits secondary to ependymoma with C4 cord compression and incomplete C4-C5 tetraplegia s/p decompression. She is having significant chronic LBP and Hip pain as well, LE spasticity. She also has low back pain and radicular symptoms in the legs that predate her spinal cord tumor resection. She is mornidly obese. I highly recommend following up with Dr. Naaman Plummer for ongoing rehabilitation.   Pain:   Referral to pain management clinic: Will refer to Pain clinic - prefer [facility] but will need to see who will take her insurance. Explained to her she needs to be seen for long-term management of narcotics.  Snoring and morbid obesity: sleep eval did not show OSA or hypoventilation obesity syndrome Morbid Obesity: I referred her to nutritionist. Discussed bariatric surgery. Rehab: Continue F/u with Dr.  Naaman Plummer. Will request Alvis Lemmings, social services, physical therapy and occupational therapy and a home nurse. NSY: F/u with Dr. Trenton Gammon for ongoing surveillance and management of ependymoma Neurogenic bowel and bladder management per Dr. Naaman Plummer: daily suppository for a set AM schedule. May need a mini enema also, timed voids every 2-3 hours while awake.  CC: Her current physicians: Dr. Trenton Gammon - neurosurgery Arnetha Gula, primary care and manages pain medications. She takes oxy IR '15mg'$  x 2 every 6 hours. Probably need a long-acting drug but insurance won't approve per patient Dr. Naaman Plummer - Rehab  [name], [facility] [address] [place], [place] [address]  Phone [phone] Fax [phone]  A total of 30 minutes was spent face-to-face with this patient. Over half this time was spent on counseling patient on the c4 injury, tetraparesis, incontinece of bowel and bladder, spasticity and chronic pain. diagnosis and different diagnostic and therapeutic options available.

## 2016-03-20 ENCOUNTER — Other Ambulatory Visit: Payer: Self-pay | Admitting: *Deleted

## 2016-03-20 DIAGNOSIS — G894 Chronic pain syndrome: Secondary | ICD-10-CM

## 2016-03-20 DIAGNOSIS — S14104S Unspecified injury at C4 level of cervical spinal cord, sequela: Secondary | ICD-10-CM

## 2016-04-02 ENCOUNTER — Emergency Department (HOSPITAL_COMMUNITY)
Admission: EM | Admit: 2016-04-02 | Discharge: 2016-04-03 | Disposition: A | Discharge status: Home / Self Care | Payer: Medicaid Other | Attending: Emergency Medicine | Admitting: Emergency Medicine

## 2016-04-02 ENCOUNTER — Emergency Department (HOSPITAL_COMMUNITY): Payer: Medicaid Other

## 2016-04-02 ENCOUNTER — Encounter (HOSPITAL_COMMUNITY): Payer: Self-pay | Admitting: Emergency Medicine

## 2016-04-02 DIAGNOSIS — Z794 Long term (current) use of insulin: Secondary | ICD-10-CM | POA: Diagnosis not present

## 2016-04-02 DIAGNOSIS — I10 Essential (primary) hypertension: Secondary | ICD-10-CM | POA: Diagnosis not present

## 2016-04-02 DIAGNOSIS — Z79899 Other long term (current) drug therapy: Secondary | ICD-10-CM | POA: Insufficient documentation

## 2016-04-02 DIAGNOSIS — Y998 Other external cause status: Secondary | ICD-10-CM | POA: Diagnosis not present

## 2016-04-02 DIAGNOSIS — R42 Dizziness and giddiness: Secondary | ICD-10-CM | POA: Insufficient documentation

## 2016-04-02 DIAGNOSIS — R079 Chest pain, unspecified: Secondary | ICD-10-CM | POA: Insufficient documentation

## 2016-04-02 DIAGNOSIS — D649 Anemia, unspecified: Secondary | ICD-10-CM | POA: Insufficient documentation

## 2016-04-02 DIAGNOSIS — Z7984 Long term (current) use of oral hypoglycemic drugs: Secondary | ICD-10-CM | POA: Insufficient documentation

## 2016-04-02 DIAGNOSIS — G8929 Other chronic pain: Secondary | ICD-10-CM | POA: Insufficient documentation

## 2016-04-02 DIAGNOSIS — J45909 Unspecified asthma, uncomplicated: Secondary | ICD-10-CM | POA: Diagnosis not present

## 2016-04-02 DIAGNOSIS — Y9289 Other specified places as the place of occurrence of the external cause: Secondary | ICD-10-CM | POA: Diagnosis not present

## 2016-04-02 DIAGNOSIS — Z87442 Personal history of urinary calculi: Secondary | ICD-10-CM | POA: Insufficient documentation

## 2016-04-02 DIAGNOSIS — Z043 Encounter for examination and observation following other accident: Secondary | ICD-10-CM | POA: Diagnosis not present

## 2016-04-02 DIAGNOSIS — R55 Syncope and collapse: Secondary | ICD-10-CM | POA: Diagnosis not present

## 2016-04-02 DIAGNOSIS — Z880 Allergy status to penicillin: Secondary | ICD-10-CM | POA: Insufficient documentation

## 2016-04-02 DIAGNOSIS — R260 Ataxic gait: Secondary | ICD-10-CM | POA: Insufficient documentation

## 2016-04-02 DIAGNOSIS — E119 Type 2 diabetes mellitus without complications: Secondary | ICD-10-CM | POA: Diagnosis not present

## 2016-04-02 DIAGNOSIS — F329 Major depressive disorder, single episode, unspecified: Secondary | ICD-10-CM | POA: Insufficient documentation

## 2016-04-02 DIAGNOSIS — M25552 Pain in left hip: Secondary | ICD-10-CM | POA: Diagnosis not present

## 2016-04-02 LAB — I-STAT BETA HCG BLOOD, ED (MC, WL, AP ONLY): I-stat hCG, quantitative: <5 m[IU]/mL (ref <5)

## 2016-04-02 LAB — BASIC METABOLIC PANEL
Anion gap: 13 (ref 5–15)
BUN: 16 mg/dL (ref 6–20)
CO2: 22 mmol/L (ref 22–32)
Calcium: 9.3 mg/dL (ref 8.9–10.3)
Chloride: 101 mmol/L (ref 101–111)
Creatinine, Ser: 0.98 mg/dL (ref 0.44–1.00)
GFR calc Af Amer: >60 mL/min (ref >60)
GFR calc non Af Amer: >60 mL/min (ref >60)
Glucose, Bld: 236 mg/dL — ABNORMAL HIGH (ref 65–99)
Potassium: 3.7 mmol/L (ref 3.5–5.1)
Sodium: 136 mmol/L (ref 135–145)

## 2016-04-02 LAB — CBC
HCT: 34.2 % — ABNORMAL LOW (ref 36.0–46.0)
Hemoglobin: 10.9 g/dL — ABNORMAL LOW (ref 12.0–15.0)
MCH: 21.7 pg — ABNORMAL LOW (ref 26.0–34.0)
MCHC: 31.9 g/dL (ref 30.0–36.0)
MCV: 68.1 fL — ABNORMAL LOW (ref 78.0–100.0)
Platelets: 372 10*3/uL (ref 150–400)
RBC: 5.02 MIL/uL (ref 3.87–5.11)
RDW: 19.4 % — ABNORMAL HIGH (ref 11.5–15.5)
WBC: 8.8 10*3/uL (ref 4.0–10.5)

## 2016-04-02 LAB — CBG MONITORING, ED: Glucose-Capillary: 241 mg/dL — ABNORMAL HIGH (ref 65–99)

## 2016-04-02 LAB — I-STAT TROPONIN, ED
Troponin i, poc: 0 ng/mL (ref 0.00–0.08)
Troponin i, poc: 0 ng/mL (ref 0.00–0.08)

## 2016-04-02 LAB — D-DIMER, QUANTITATIVE (NOT AT ARMC): D-Dimer, Quant: 0.37 ug/mL-FEU (ref 0.00–0.50)

## 2016-04-02 MED ORDER — HYDROMORPHONE HCL 1 MG/ML IJ SOLN
2.0000 mg | Freq: Once | INTRAMUSCULAR | Status: AC
  Administered 2016-04-02: 2 mg via INTRAVENOUS
  Filled 2016-04-02: qty 2

## 2016-04-02 MED ORDER — LORAZEPAM 2 MG/ML IJ SOLN
1.0000 mg | Freq: Once | INTRAMUSCULAR | Status: DC
  Filled 2016-04-02: qty 1

## 2016-04-02 MED ORDER — HYDROMORPHONE HCL 1 MG/ML IJ SOLN
1.0000 mg | Freq: Once | INTRAMUSCULAR | Status: AC
  Administered 2016-04-02: 1 mg via INTRAVENOUS
  Filled 2016-04-02: qty 1

## 2016-04-02 MED ORDER — SODIUM CHLORIDE 0.9 % IV BOLUS (SEPSIS)
1000.0000 mL | Freq: Once | INTRAVENOUS | Status: AC
  Administered 2016-04-02: 1000 mL via INTRAVENOUS

## 2016-04-02 MED ORDER — KETOROLAC TROMETHAMINE 15 MG/ML IJ SOLN
15.0000 mg | Freq: Once | INTRAMUSCULAR | Status: AC
  Administered 2016-04-02: 15 mg via INTRAVENOUS
  Filled 2016-04-02: qty 1

## 2016-04-02 MED ORDER — DIPHENHYDRAMINE HCL 25 MG PO CAPS
25.0000 mg | ORAL_CAPSULE | Freq: Once | ORAL | Status: AC
  Administered 2016-04-02: 25 mg via ORAL
  Filled 2016-04-02: qty 1

## 2016-04-02 NOTE — ED Notes (Signed)
Dr. Sande Brothers at the bedside for pain management

## 2016-04-02 NOTE — ED Notes (Signed)
Preparing patient for discharge. Unable to walk as paralyzed from waist down. Reports return address: [address] [place], [place] [address]. She reports someone already home to open the door.

## 2016-04-02 NOTE — ED Notes (Signed)
Reported pain to Dr. Ellender Hose. MD acknowledges, orders repeat 2mg  of dilaudid. Reviewed xrays.
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
## 2016-04-02 NOTE — ED Notes (Signed)
Discussed plan of care with Dr. Ellender Hose, awaiting xrays. POC pregnancy changed to istat hcg for xrays to be completed.

## 2016-04-02 NOTE — ED Notes (Signed)
Dr. Venora Maples at the bedside to update.

## 2016-04-15 ENCOUNTER — Telehealth: Payer: Self-pay | Admitting: *Deleted

## 2016-04-15 NOTE — Telephone Encounter (Signed)
She is on a large dose of valium already, I gave her 120 5mg  pills. She can take 4 of those a day. If she needs more she needs to go to the pain clinic, that is why I referred her there thanks.

## 2016-04-15 NOTE — Telephone Encounter (Signed)
She filled it at [facility], spoke to her. She has refills thanks

## 2016-04-15 NOTE — Telephone Encounter (Signed)
Dr Ahern- please advise 

## 2016-04-15 NOTE — Telephone Encounter (Signed)
------------------------------------------------------------   Caller Cynthia Rosales              CID [id number]  Patient SAME                 Pt's Dr Jaynee Eagles        Area Code [id number] Phone# [phone] * [phone]     RE STILL WAITING FOR RX TO BE FAXED                  [place]                           Disp:Y/N N If Y = C/B If No Response In 40minutes ============================================================

## 2016-04-15 NOTE — Telephone Encounter (Addendum)
Called pt back. Relayed Dr Jaynee Eagles message below. I advised Dr Jaynee Eagles gave her a printed rx back on 01/02/16 120tabs 5 refills at her OV. Pt declines stating she never received printed prescription and thought Dr Jaynee Eagles was going to send it to her pharmacy. Advised pt that we will look into this and I will call her back to update her. She verbalized understanding.

## 2016-04-15 NOTE — Telephone Encounter (Signed)
Called pt back. She stated she is requesting refill on valium for 10mg  instead of 5mg . She stated she spoke to Dr Jaynee Eagles at last office visit about getting this refill. I advised I need to verify with Dr Jaynee Eagles first. If she is okay with sending refill, I will send to her pharmacy. If not, I will call her back. If she does not hear back from me, I sent refill as requested. She verbalized understanding.

## 2016-05-11 ENCOUNTER — Encounter (HOSPITAL_COMMUNITY): Payer: Self-pay | Admitting: Emergency Medicine

## 2016-05-11 ENCOUNTER — Emergency Department (HOSPITAL_COMMUNITY)
Admission: EM | Admit: 2016-05-11 | Discharge: 2016-05-11 | Discharge status: Against Medical Advice | Payer: Medicaid Other | Attending: Emergency Medicine | Admitting: Emergency Medicine

## 2016-05-11 ENCOUNTER — Emergency Department (HOSPITAL_COMMUNITY): Payer: Medicaid Other

## 2016-05-11 DIAGNOSIS — M545 Low back pain: Secondary | ICD-10-CM | POA: Insufficient documentation

## 2016-05-11 DIAGNOSIS — Y92410 Unspecified street and highway as the place of occurrence of the external cause: Secondary | ICD-10-CM | POA: Insufficient documentation

## 2016-05-11 DIAGNOSIS — I10 Essential (primary) hypertension: Secondary | ICD-10-CM | POA: Insufficient documentation

## 2016-05-11 DIAGNOSIS — F329 Major depressive disorder, single episode, unspecified: Secondary | ICD-10-CM | POA: Insufficient documentation

## 2016-05-11 DIAGNOSIS — Y999 Unspecified external cause status: Secondary | ICD-10-CM | POA: Insufficient documentation

## 2016-05-11 DIAGNOSIS — E119 Type 2 diabetes mellitus without complications: Secondary | ICD-10-CM | POA: Diagnosis not present

## 2016-05-11 DIAGNOSIS — M542 Cervicalgia: Secondary | ICD-10-CM | POA: Diagnosis not present

## 2016-05-11 DIAGNOSIS — Z794 Long term (current) use of insulin: Secondary | ICD-10-CM | POA: Diagnosis not present

## 2016-05-11 DIAGNOSIS — J45909 Unspecified asthma, uncomplicated: Secondary | ICD-10-CM | POA: Diagnosis not present

## 2016-05-11 DIAGNOSIS — Z79899 Other long term (current) drug therapy: Secondary | ICD-10-CM | POA: Insufficient documentation

## 2016-05-11 DIAGNOSIS — Z7984 Long term (current) use of oral hypoglycemic drugs: Secondary | ICD-10-CM | POA: Diagnosis not present

## 2016-05-11 DIAGNOSIS — Y939 Activity, unspecified: Secondary | ICD-10-CM | POA: Insufficient documentation

## 2016-05-11 MED ORDER — KETOROLAC TROMETHAMINE 60 MG/2ML IM SOLN
60.0000 mg | Freq: Once | INTRAMUSCULAR | Status: AC
  Administered 2016-05-11: 60 mg via INTRAMUSCULAR
  Filled 2016-05-11: qty 2

## 2016-05-11 MED ORDER — OXYCODONE HCL 5 MG PO TABS
30.0000 mg | ORAL_TABLET | Freq: Once | ORAL | Status: AC
  Administered 2016-05-11: 30 mg via ORAL
  Filled 2016-05-11: qty 6
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
## 2016-05-11 NOTE — ED Notes (Signed)
Patient transported to X-ray 

## 2016-05-19 ENCOUNTER — Emergency Department (HOSPITAL_COMMUNITY): Payer: Medicaid Other

## 2016-05-19 ENCOUNTER — Other Ambulatory Visit: Payer: Self-pay

## 2016-05-19 ENCOUNTER — Encounter (HOSPITAL_COMMUNITY): Payer: Self-pay | Admitting: *Deleted

## 2016-05-19 ENCOUNTER — Emergency Department (HOSPITAL_COMMUNITY)
Admission: EM | Admit: 2016-05-19 | Discharge: 2016-05-19 | Disposition: A | Discharge status: Home / Self Care | Payer: Medicaid Other | Attending: Emergency Medicine | Admitting: Emergency Medicine

## 2016-05-19 DIAGNOSIS — R0789 Other chest pain: Secondary | ICD-10-CM | POA: Diagnosis not present

## 2016-05-19 DIAGNOSIS — T1490XA Injury, unspecified, initial encounter: Secondary | ICD-10-CM

## 2016-05-19 DIAGNOSIS — R0602 Shortness of breath: Secondary | ICD-10-CM | POA: Insufficient documentation

## 2016-05-19 DIAGNOSIS — J45909 Unspecified asthma, uncomplicated: Secondary | ICD-10-CM | POA: Diagnosis not present

## 2016-05-19 DIAGNOSIS — Y92410 Unspecified street and highway as the place of occurrence of the external cause: Secondary | ICD-10-CM | POA: Diagnosis not present

## 2016-05-19 DIAGNOSIS — Z794 Long term (current) use of insulin: Secondary | ICD-10-CM | POA: Diagnosis not present

## 2016-05-19 DIAGNOSIS — Y999 Unspecified external cause status: Secondary | ICD-10-CM | POA: Diagnosis not present

## 2016-05-19 DIAGNOSIS — I10 Essential (primary) hypertension: Secondary | ICD-10-CM | POA: Diagnosis not present

## 2016-05-19 DIAGNOSIS — Z79899 Other long term (current) drug therapy: Secondary | ICD-10-CM | POA: Insufficient documentation

## 2016-05-19 DIAGNOSIS — Y939 Activity, unspecified: Secondary | ICD-10-CM | POA: Insufficient documentation

## 2016-05-19 DIAGNOSIS — E119 Type 2 diabetes mellitus without complications: Secondary | ICD-10-CM | POA: Diagnosis not present

## 2016-05-19 DIAGNOSIS — R079 Chest pain, unspecified: Secondary | ICD-10-CM | POA: Diagnosis present

## 2016-05-19 LAB — I-STAT TROPONIN, ED: Troponin i, poc: 0 ng/mL (ref 0.00–0.08)

## 2016-05-19 LAB — COMPREHENSIVE METABOLIC PANEL
ALT: 14 U/L (ref 14–54)
AST: 15 U/L (ref 15–41)
Albumin: 3.2 g/dL — ABNORMAL LOW (ref 3.5–5.0)
Alkaline Phosphatase: 90 U/L (ref 38–126)
Anion gap: 8 (ref 5–15)
BUN: 13 mg/dL (ref 6–20)
CO2: 27 mmol/L (ref 22–32)
Calcium: 9.1 mg/dL (ref 8.9–10.3)
Chloride: 100 mmol/L — ABNORMAL LOW (ref 101–111)
Creatinine, Ser: 0.94 mg/dL (ref 0.44–1.00)
GFR calc Af Amer: >60 mL/min (ref >60)
GFR calc non Af Amer: >60 mL/min (ref >60)
Glucose, Bld: 280 mg/dL — ABNORMAL HIGH (ref 65–99)
Potassium: 4 mmol/L (ref 3.5–5.1)
Sodium: 135 mmol/L (ref 135–145)
Total Bilirubin: 0.1 mg/dL — ABNORMAL LOW (ref 0.3–1.2)
Total Protein: 7.5 g/dL (ref 6.5–8.1)

## 2016-05-19 LAB — BRAIN NATRIURETIC PEPTIDE: B Natriuretic Peptide: 13.9 pg/mL (ref 0.0–100.0)

## 2016-05-19 LAB — CBC
HCT: 36 % (ref 36.0–46.0)
Hemoglobin: 11.3 g/dL — ABNORMAL LOW (ref 12.0–15.0)
MCH: 22 pg — ABNORMAL LOW (ref 26.0–34.0)
MCHC: 31.4 g/dL (ref 30.0–36.0)
MCV: 70 fL — ABNORMAL LOW (ref 78.0–100.0)
Platelets: 383 10*3/uL (ref 150–400)
RBC: 5.14 MIL/uL — ABNORMAL HIGH (ref 3.87–5.11)
RDW: 17.8 % — ABNORMAL HIGH (ref 11.5–15.5)
WBC: 11.3 10*3/uL — ABNORMAL HIGH (ref 4.0–10.5)

## 2016-05-19 LAB — LIPASE, BLOOD: Lipase: 27 U/L (ref 11–51)

## 2016-05-19 LAB — I-STAT BETA HCG BLOOD, ED (MC, WL, AP ONLY): I-stat hCG, quantitative: <5 m[IU]/mL (ref <5)

## 2016-05-19 MED ORDER — ONDANSETRON HCL 4 MG/2ML IJ SOLN
4.0000 mg | Freq: Once | INTRAMUSCULAR | Status: AC
  Administered 2016-05-19: 4 mg via INTRAVENOUS
  Filled 2016-05-19: qty 2

## 2016-05-19 MED ORDER — IOPAMIDOL (ISOVUE-370) INJECTION 76%
INTRAVENOUS | Status: AC
  Filled 2016-05-19: qty 100

## 2016-05-19 MED ORDER — HYDROMORPHONE HCL 1 MG/ML IJ SOLN
0.5000 mg | Freq: Once | INTRAMUSCULAR | Status: AC
  Administered 2016-05-19: 0.5 mg via INTRAVENOUS
  Filled 2016-05-19: qty 1

## 2016-05-19 MED ORDER — HYDROMORPHONE HCL 1 MG/ML IJ SOLN
1.0000 mg | Freq: Once | INTRAMUSCULAR | Status: AC
  Administered 2016-05-19: 1 mg via INTRAVENOUS
  Filled 2016-05-19: qty 1

## 2016-05-19 MED ORDER — IOPAMIDOL (ISOVUE-370) INJECTION 76%
INTRAVENOUS | Status: AC
  Administered 2016-05-19: 100 mL
  Filled 2016-05-19: qty 100
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
## 2016-05-19 NOTE — Discharge Instructions (Signed)
Take 4 over the counter ibuprofen tablets 3 times a day or 2 over-the-counter naproxen tablets twice a day for pain.  Tylenol 1-2 tabs by q4h prn  Chest Wall Pain Chest wall pain is pain in or around the bones and muscles of your chest. Sometimes, an injury causes this pain. Sometimes, the cause may not be known. This pain may take several weeks or longer to get better. HOME CARE INSTRUCTIONS  Pay attention to any changes in your symptoms. Take these actions to help with your pain:   Rest as told by your health care provider.   Avoid activities that cause pain. These include any activities that use your chest muscles or your abdominal and side muscles to lift heavy items.   If directed, apply ice to the painful area:  Put ice in a plastic bag.  Place a towel between your skin and the bag.  Leave the ice on for 20 minutes, 2-3 times per day.  Take over-the-counter and prescription medicines only as told by your health care provider.  Do not use tobacco products, including cigarettes, chewing tobacco, and e-cigarettes. If you need help quitting, ask your health care provider.  Keep all follow-up visits as told by your health care provider. This is important. SEEK MEDICAL CARE IF:  You have a fever.  Your chest pain becomes worse.  You have new symptoms. SEEK IMMEDIATE MEDICAL CARE IF:  You have nausea or vomiting.  You feel sweaty or light-headed.  You have a cough with phlegm (sputum) or you cough up blood.  You develop shortness of breath.   This information is not intended to replace advice given to you by your health care provider. Make sure you discuss any questions you have with your health care provider.   Document Released: 11/16/2005 Document Revised: 08/07/2015 Document Reviewed: 02/11/2015 Elsevier Interactive Patient Education [employer].
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
## 2016-05-19 NOTE — ED Notes (Signed)
Pt is reporting a watery diarrhea for the last 4 days

## 2016-07-06 ENCOUNTER — Other Ambulatory Visit: Payer: Self-pay | Admitting: Neurology

## 2016-07-06 NOTE — Telephone Encounter (Signed)
Dr Jaynee Eagles- Are you okay with refilling valium? You last gave pt rx on 01/02/16 for quantity of 120 tab for 5 refills.  She has a f/u on 8/21 with you.

## 2016-07-08 NOTE — Telephone Encounter (Signed)
Called pt. Verified her pharmacy. Faxed rx valium refill per pt request. Fax: [phone].  Received confirmation.

## 2016-07-08 NOTE — Telephone Encounter (Signed)
Called pt pharmacy, [facility] at [phone]. Spoke to [place]. He stated pt did have an rx valium with 4 refills that expired on 06/30/16. She would need a new rx faxed.   Per Dr Jaynee Eagles- can send in new rx for quantity 60, 1 refill.

## 2016-07-20 ENCOUNTER — Telehealth: Payer: Self-pay | Admitting: *Deleted

## 2016-07-20 ENCOUNTER — Ambulatory Visit: Payer: Medicaid Other | Admitting: Neurology

## 2016-07-20 NOTE — Telephone Encounter (Signed)
no showed f/u- 2nd no show

## 2016-07-23 ENCOUNTER — Encounter: Payer: Self-pay | Admitting: Physical Therapy

## 2016-07-23 ENCOUNTER — Ambulatory Visit: Payer: Medicaid Other | Attending: Primary Care | Admitting: Physical Therapy

## 2016-07-23 DIAGNOSIS — M6281 Muscle weakness (generalized): Secondary | ICD-10-CM | POA: Diagnosis not present

## 2016-07-23 DIAGNOSIS — R261 Paralytic gait: Secondary | ICD-10-CM | POA: Diagnosis present

## 2016-07-23 DIAGNOSIS — M545 Low back pain: Secondary | ICD-10-CM | POA: Diagnosis present

## 2016-07-23 NOTE — Therapy (Signed)
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 sufficient strength and trunk stability to safely operate a POV (scooter)?                                  '[]'$   Yes '[x]'$  No Does patient need additional features/benefits provided by a power wheelchair for MRADL's in the home?       '[x]'$  Yes '[]'$  No Does the patient demonstrate the ability to safely use a power wheelchair?                                                              '[x]'$  Yes '[]'$  No  Therapist Name Printed: Jamey Reas, PT, DPT Date: 07/23/2016  Therapist's Signature:   Date:   Supplier's Name Printed: [employer] Date: 07/23/2016  Supplier's Signature:   Date:  Patient/Caregiver Signature:   Date:     This is to certify that I have read this evaluation and do agree with the content within:    Physician's Name Printed: Juluis Mire, NP  Physician's Signature:  Date:     This is to certify that I, the above signed therapist have the following affiliations: '[]'$  This DME  provider '[]'$  Manufacturer of recommended equipment '[]'$  Patient'songerca fily No othebo                             Plan - 07/23/16 2225    Clinical Impression Statement Patient will benefit from group 3 power w/c for function in her home.   Rehab Potential Good   PT Frequency One time visit   PT Treatment/Interventions Wheelchair mobility training   PT Next Visit Plan power w/c evaluation only   Consulted and Agree with Plan of Care Patient            Patient will benefit from skilled therapeutic intervention in order to improve the following deficits anAbnormal gait, Painmpairments:     ViMuscle weakness (generalized)  Paralytic gait  Bilateral low back pain, with sciatica presence unspecifiedagnis found.     P Patient Active Problem List   Diagnosis Date Noted  . 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  . Neurogenic bladder 08/23/2015  . Bilateral carpal tunnel syndrome 08/23/2015  . Paraparesis of both lower limbs ([place]) 07/14/2015  . Ileus, postoperative 12/25/2014  . Sickle cell disease without crisis ([place]) 12/25/2014  . Diabetes mellitus type 2 in obese ([place]) 12/25/2014  . 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[place]) 12/24/2014  . 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 Ileus of unspecified type ([place])   . Atelectasis   . Bowel obstruction ([place])   . Encounter for central line care   . Encounter for nasogastric (NG) tube placement   . Ileus ([place])   . Intestinal occlusion ([facility])   . 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 Headache 11/19/2014  . Neck pain 11/19/2014  . Right sided weakness 11/19/2014  . Paresthesias 11/19/2014  . Nightmares 09/11/2014  . HTN (hypertension), benign 03/28/2014  . Nephrolithiasis 03/27/2014  . Diabetes mellitus ([place]) 03/27/2014  . Abdominal pain 03/27/2014   04/2WALDRON,ROBINDRON,ROBIN8/24/20178/210:29 PM10:14 AM  [id number] [phone]   [phone]  Shiane Wenberg NOM:[id number] Date of Birth: May 16, 1981

## 2016-07-24 ENCOUNTER — Encounter: Payer: Self-pay | Admitting: Neurology

## 2016-08-12 ENCOUNTER — Ambulatory Visit (INDEPENDENT_AMBULATORY_CARE_PROVIDER_SITE_OTHER): Payer: Medicaid Other | Admitting: Neurology

## 2016-08-12 ENCOUNTER — Encounter: Payer: Self-pay | Admitting: Neurology

## 2016-08-12 VITALS — BP 178/96 | HR 119 | Ht 64.0 in | Wt 334.0 lb

## 2016-08-12 DIAGNOSIS — R252 Cramp and spasm: Secondary | ICD-10-CM

## 2016-08-12 DIAGNOSIS — M5416 Radiculopathy, lumbar region: Secondary | ICD-10-CM

## 2016-08-12 DIAGNOSIS — R258 Other abnormal involuntary movements: Secondary | ICD-10-CM

## 2016-08-12 DIAGNOSIS — M5410 Radiculopathy, site unspecified: Secondary | ICD-10-CM | POA: Diagnosis not present

## 2016-08-12 DIAGNOSIS — G8929 Other chronic pain: Secondary | ICD-10-CM | POA: Diagnosis not present

## 2016-08-12 DIAGNOSIS — M549 Dorsalgia, unspecified: Secondary | ICD-10-CM

## 2016-08-12 DIAGNOSIS — S14104S Unspecified injury at C4 level of cervical spinal cord, sequela: Secondary | ICD-10-CM | POA: Diagnosis not present

## 2016-08-12 MED ORDER — BACLOFEN 10 MG PO TABS: ORAL_TABLET | ORAL | 5 refills | Status: DC

## 2016-08-12 MED ORDER — DIAZEPAM 5 MG PO TABS: ORAL_TABLET | ORAL | 5 refills | Status: DC

## 2016-08-12 NOTE — Patient Instructions (Signed)
Remember to drink plenty of fluid, eat healthy meals and do not skip any meals. Try to eat protein with a every meal and eat a healthy snack such as fruit or nuts in between meals. Try to keep a regular sleep-wake schedule and try to exercise daily, particularly in the form of walking, 20-30 minutes a day, if you can.   As far as your medications are concerned, I would like to suggest: Diazepam 1-2 tablets up to three times a day Baclofen. Start with 1/2 tablet 3x a day then can increase to 1 tablet (10 mg total) by mouth 3 (three) times daily. Watch for sedation and falls TENS Unit  I would like to see you back in 4 months, sooner if we need to. Please call us with any interim questions, concerns, problems, updates or refill requests.   Our phone number is [phone]. We also have an after hours call service for urgent matters and there is a physician on-call for urgent questions. For any emergencies you know to call 911 or go to the nearest emergency room  Baclofen tablets What is this medicine? BACLOFEN (BAK loe fen) helps relieve spasms and cramping of muscles. It may be used to treat symptoms of multiple sclerosis or spinal cord injury. This medicine may be used for other purposes; ask your health care provider or pharmacist if you have questions. What should I tell my health care provider before I take this medicine? They need to know if you have any of these conditions: -kidney disease -seizures -stroke -an unusual or allergic reaction to baclofen, other medicines, foods, dyes, or preservatives -pregnant or trying to get pregnant -breast-feeding How should I use this medicine? Take this medicine by mouth. Swallow it with a drink of water. Follow the directions on the prescription label. Do not take more medicine than you are told to take. Talk to your pediatrician regarding the use of this medicine in children. Special care may be needed. Overdosage: If you think you have taken  too much of this medicine contact a poison control center or emergency room at once. NOTE: This medicine is only for you. Do not share this medicine with others. What if I miss a dose? If you miss a dose, take it as soon as you can. If it is almost time for your next dose, take only that dose. Do not take double or extra doses. What may interact with this medicine? -alcohol -antihistamines -medicines for depression, anxiety, and other mental conditions -medicines for pain like codeine, oxycodone, tramadol, and propoxyphene -medicines for sleep -phenobarbital This list may not describe all possible interactions. Give your health care provider a list of all the medicines, herbs, non-prescription drugs, or dietary supplements you use. Also tell them if you smoke, drink alcohol, or use illegal drugs. Some items may interact with your medicine. What should I watch for while using this medicine? Do not suddenly stop taking your medicine. If you do, you may develop a severe reaction. If your doctor wants you to stop the medicine, the dose will be slowly lowered over time to avoid any side effects. Follow the advice of your doctor. Baclofen can affect blood sugar levels. If you have diabetes, talk with your doctor or health care professional before you take the medicine. You may get drowsy or dizzy when you first start taking the medicine or change doses. Do not drive, use machinery, or do anything that may be dangerous until you know how the medicine affects you. Stand  or sit up slowly. What side effects may I notice from receiving this medicine? Side effects that you should report to your doctor or health care professional as soon as possible: -allergic reactions like skin rash, itching or hives, swelling of the face, lips, or tongue -chest pain -hallucinations -seizure Side effects that usually do not require medical attention (report to your doctor or health care professional if they continue or  are bothersome): -confusion -difficulty sleeping -headache -nausea This list may not describe all possible side effects. Call your doctor for medical advice about side effects. You may report side effects to FDA at 1-800-FDA-1088. Where should I keep my medicine? Keep out of the reach of children. Store at room temperature between 15 and 30 degrees C (59 and 86 degrees F). Keep container tightly closed. Throw away any unused medicine after the expiration date. NOTE: This sheet is a summary. It may not cover all possible information. If you have questions about this medicine, talk to your doctor, pharmacist, or health care provider.    2016, Elsevier/[employer] Standard. (2008-02-16 13:22:23)   Diazepam tablets What is this medicine? DIAZEPAM (dye AZ e pam) is a benzodiazepine. It is used to treat anxiety and nervousness. It also can help treat alcohol withdrawal, relax muscles, and treat certain types of seizures. This medicine may be used for other purposes; ask your health care provider or pharmacist if you have questions. What should I tell my health care provider before I take this medicine? They need to know if you have any of these conditions -an alcohol or drug abuse problem -bipolar disorder, depression, psychosis or other mental health condition -glaucoma -kidney or liver disease -lung or breathing disease -myasthenia gravis -Parkinson's disease -seizures or a history of seizures -suicidal thoughts -an unusual or allergic reaction to diazepam, other benzodiazepines, foods, dyes, or preservatives -pregnant or trying to get pregnant -breast-feeding How should I use this medicine? Take this medicine by mouth with a glass of water. Follow the directions on the prescription label. If this medicine upsets your stomach, take it with food or milk. Take your doses at regular intervals. Do not take your medicine more often than directed. If you have been taking this medicine regularly for  some time, do not suddenly stop taking it. You must gradually reduce the dose or you may get severe side effects. Ask your doctor or health care professional for advice. Even after you stop taking this medicine it can still affect your body for several days. Talk to your pediatrician regarding the use of this medicine in children. Special care may be needed. Overdosage: If you think you have taken too much of this medicine contact a poison control center or emergency room at once. NOTE: This medicine is only for you. Do not share this medicine with others. What if I miss a dose? If you miss a dose, take it as soon as you can. If it is almost time for your next dose, take only that dose. Do not take double or extra doses. What may interact with this medicine? -cimetidine -grapefruit juice -herbal or dietary supplements like kava kava, melatonin, St. John's Wort, or valerian -medicines for anxiety or sleeping problems, like alprazolam, lorazepam, or triazolam -medicines for depression, mental problems or psychiatric disturbances -medicines for HIV infection or AIDS -prescription pain medicines -rifampin, rifapentine, or rifabutin -some medicines for seizures like carbamazepine, phenobarbital, phenytoin, or primidone This list may not describe all possible interactions. Give your health care provider a list of all  the medicines, herbs, non-prescription drugs, or dietary supplements you use. Also tell them if you smoke, drink alcohol, or use illegal drugs. Some items may interact with your medicine. What should I watch for while using this medicine? Visit your doctor or health care professional for regular checks on your progress. Your body can become dependent on this medicine. Ask your doctor or health care professional if you still need to take it. You may get drowsy or dizzy. Do not drive, use machinery, or do anything that needs mental alertness until you know how this medicine affects you. To  reduce the risk of dizzy and fainting spells, do not stand or sit up quickly, especially if you are an older patient. Alcohol may increase dizziness and drowsiness. Avoid alcoholic drinks. Do not treat yourself for coughs, colds or allergies without asking your doctor or health care professional for advice. Some ingredients can increase possible side effects. What side effects may I notice from receiving this medicine? Side effects that you should report to your doctor or health care professional as soon as possible: -allergic reactions like skin rash, itching or hives, swelling of the face, lips, or tongue -angry, confused, depressed, other mood changes -breathing problems -feeling faint or lightheaded, falls -muscle cramps -problems with balance, talking, walking -restlessness -tremors -trouble passing urine or change in the amount of urine -unusually weak or tired Side effects that usually do not require medical attention (report to your doctor or health care professional if they continue or are bothersome): -difficulty sleeping, nightmares -dizziness, drowsiness, clumsiness, or unsteadiness, a hangover effect -headache -nausea, vomiting This list may not describe all possible side effects. Call your doctor for medical advice about side effects. You may report side effects to FDA at 1-800-FDA-1088. Where should I keep my medicine? Keep out of the reach of children. This medicine can be abused. Keep your medicine in a safe place to protect it from theft. Do not share this medicine with anyone. Selling or giving away this medicine is dangerous and against the law. This medicine may cause accidental overdose and death if taken by other adults, children, or pets. Mix any unused medicine with a substance like cat litter or coffee grounds. Then throw the medicine away in a sealed container like a sealed bag or a coffee can with a lid. Do not use the medicine after the expiration date. Store at  room temperature between 15 and 30 degrees C (59 and 86 degrees F). Protect from light. Keep container tightly closed. NOTE: This sheet is a summary. It may not cover all possible information. If you have questions about this medicine, talk to your doctor, pharmacist, or health care provider.    2016, [employer]vier/Gold Standard. (2014-08-07 15:16:42)
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
## 2016-09-07 ENCOUNTER — Ambulatory Visit: Payer: Medicaid Other | Admitting: Rehabilitation

## 2016-09-10 ENCOUNTER — Other Ambulatory Visit: Payer: Self-pay | Admitting: Neurosurgery

## 2016-09-10 DIAGNOSIS — M542 Cervicalgia: Secondary | ICD-10-CM

## 2016-09-14 ENCOUNTER — Ambulatory Visit: Payer: Medicaid Other | Admitting: Physical Therapy

## 2016-09-21 ENCOUNTER — Ambulatory Visit: Payer: Medicaid Other | Attending: Primary Care | Admitting: Physical Therapy

## 2016-09-21 DIAGNOSIS — M5440 Lumbago with sciatica, unspecified side: Secondary | ICD-10-CM | POA: Insufficient documentation

## 2016-09-21 DIAGNOSIS — G8929 Other chronic pain: Secondary | ICD-10-CM | POA: Diagnosis present
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
## 2016-09-24 ENCOUNTER — Other Ambulatory Visit: Payer: Self-pay

## 2016-09-25 ENCOUNTER — Other Ambulatory Visit: Payer: Self-pay

## 2016-09-28 ENCOUNTER — Other Ambulatory Visit: Payer: Self-pay

## 2016-10-06 ENCOUNTER — Ambulatory Visit
Admission: RE | Admit: 2016-10-06 | Discharge: 2016-10-06 | Disposition: A | Discharge status: Home / Self Care | Payer: Medicaid Other | Source: Ambulatory Visit | Attending: Neurosurgery | Admitting: Neurosurgery

## 2016-10-06 DIAGNOSIS — M542 Cervicalgia: Secondary | ICD-10-CM

## 2016-10-06 MED ORDER — GADOBENATE DIMEGLUMINE 529 MG/ML IV SOLN
20.0000 mL | Freq: Once | INTRAVENOUS | Status: AC | PRN
  Administered 2016-10-06: 20 mL via INTRAVENOUS

## 2016-10-07 ENCOUNTER — Telehealth: Payer: Self-pay | Admitting: Neurology

## 2016-10-07 NOTE — Telephone Encounter (Signed)
Called Tiffany back. Advised the most recent values we have are height: 5'4'' and weight 334lb. She verbalized understanding and stated the patients insurance requires height and weight for tens unit. Nothing further needed at this time.

## 2016-10-07 NOTE — Telephone Encounter (Signed)
Tiffany with [employer] is calling to get the height and weight of the patient for a Tens unit given to the patient on 09-21-16. Tiffany can be reached at [phone] Ext. [phone].

## 2016-10-12 NOTE — Telephone Encounter (Signed)
Faxed signed request for prior approval for tens unit back to [employer]. Fax: [phone]. Received confirmation.

## 2016-10-26 ENCOUNTER — Encounter (HOSPITAL_COMMUNITY): Payer: Self-pay | Admitting: *Deleted

## 2016-10-26 ENCOUNTER — Emergency Department (HOSPITAL_COMMUNITY): Payer: Medicaid Other

## 2016-10-26 ENCOUNTER — Emergency Department (HOSPITAL_COMMUNITY)
Admission: EM | Admit: 2016-10-26 | Discharge: 2016-10-26 | Disposition: A | Discharge status: Home / Self Care | Payer: Medicaid Other | Attending: Emergency Medicine | Admitting: Emergency Medicine

## 2016-10-26 DIAGNOSIS — E119 Type 2 diabetes mellitus without complications: Secondary | ICD-10-CM | POA: Diagnosis not present

## 2016-10-26 DIAGNOSIS — Y929 Unspecified place or not applicable: Secondary | ICD-10-CM | POA: Insufficient documentation

## 2016-10-26 DIAGNOSIS — M25552 Pain in left hip: Secondary | ICD-10-CM | POA: Insufficient documentation

## 2016-10-26 DIAGNOSIS — I10 Essential (primary) hypertension: Secondary | ICD-10-CM | POA: Insufficient documentation

## 2016-10-26 DIAGNOSIS — W19XXXA Unspecified fall, initial encounter: Secondary | ICD-10-CM | POA: Insufficient documentation

## 2016-10-26 DIAGNOSIS — Y939 Activity, unspecified: Secondary | ICD-10-CM | POA: Insufficient documentation

## 2016-10-26 DIAGNOSIS — Y999 Unspecified external cause status: Secondary | ICD-10-CM | POA: Diagnosis not present

## 2016-10-26 DIAGNOSIS — Z794 Long term (current) use of insulin: Secondary | ICD-10-CM | POA: Diagnosis not present

## 2016-10-26 DIAGNOSIS — J45909 Unspecified asthma, uncomplicated: Secondary | ICD-10-CM | POA: Insufficient documentation

## 2016-10-26 MED ORDER — HYDROMORPHONE HCL 2 MG/ML IJ SOLN
1.0000 mg | Freq: Once | INTRAMUSCULAR | Status: AC
  Administered 2016-10-26: 1 mg via INTRAMUSCULAR
  Filled 2016-10-26: qty 1

## 2016-10-26 MED ORDER — KETOROLAC TROMETHAMINE 30 MG/ML IJ SOLN
30.0000 mg | Freq: Once | INTRAMUSCULAR | Status: AC
  Administered 2016-10-26: 30 mg via INTRAMUSCULAR
  Filled 2016-10-26: qty 1

## 2016-10-26 MED ORDER — DIAZEPAM 5 MG PO TABS
5.0000 mg | ORAL_TABLET | Freq: Once | ORAL | Status: AC
  Administered 2016-10-26: 5 mg via ORAL
  Filled 2016-10-26: qty 1

## 2016-10-26 MED ORDER — HYDROMORPHONE HCL 2 MG/ML IJ SOLN
1.5000 mg | Freq: Once | INTRAMUSCULAR | Status: AC
  Administered 2016-10-26: 1.5 mg via INTRAMUSCULAR
  Filled 2016-10-26: qty 1

## 2016-10-26 NOTE — ED Provider Notes (Addendum)
[place] DEPT Provider Note   CSN: [id number] Arrival date & time: 10/26/16  1024  By signing my name below, I, Delton Prairie, attest that this documentation has been prepared under the direction and in the presence of Virgel Manifold, MD  Electronically Signed: Delton Prairie, ED Scribe. 10/26/16. 12:42 PM.   History   Chief Complaint Chief Complaint  Patient presents with  . Hip Pain   The history is provided by the patient. No language interpreter was used.   HPI Comments:  Cynthia Rosales is a 34 y.o. female who presents to the Emergency Department complaining of worsened left hip pain s/p a fall which occurred 2 days ago. She states she fell on her left side and describes the pain as a burning and aching pain. Pt states she has numbness in her legs at baseline with associated total weakness due to a tumor on her spine. She uses a walker to ambulate. Pt has taken valium and oxycodone with no relief. She states her doctors told her was too young to get a hip replacement in the past. She denies any other associated symptoms and modifying factors at this time. Pt is followed by a neurologist and orthopedist.   Past Medical History:  Diagnosis Date  . Abscess of bursa, left elbow 06/2013   Treated with I and D/antibiotics.   . Anemia   . Asthma   . Depression   . History of blood transfusion    "after I had one of my kids"  . Hypertension   . Ovarian cyst   . Renal disorder    kidney stones  . Sickle cell disease ([place])   . Type II diabetes mellitus [facility])     Patient Active Problem List   Diagnosis Date Noted  . 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  . Neurogenic bladder 08/23/2015  . Bilateral carpal tunnel syndrome 08/23/2015  . Paraparesis of both lower limbs ([place]) 07/14/2015  . Ileus, postoperative ([place]) 12/25/2014  . Sickle cell disease without crisis ([place]) 12/25/2014  . Diabetes mellitus type 2 in obese ([place]) 12/25/2014  . 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[place]) 12/24/2014  . 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 Ileus of unspecified type ([place])   . Atelectasis   . Bowel obstruction   . Encounter for central line care   . Encounter for nasogastric (NG) tube placement   . Ileus ([place])   . Intestinal occlusion   . 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  . Headache 11/19/2014  . Neck pain 11/19/2014  . Right sided weakness 11/19/2014  . Paresthesias 11/19/2014  . Nightmares 09/11/2014  . HTN (hypertension), benign 03/28/2014  . Nephrolithiasis 03/27/2014  . Diabetes mellitus ([place]) 03/27/2014  . Abdominal pain 03/27/2014    Past Surgical History:  Procedure Laterality Date  . APPENDECTOMY  2013  . CARPAL TUNNEL RELEASE    . CESAREAN SECTION  2000; 2007; 2011  . DILATION AND CURETTAGE OF UTERUS    . LAMINECTOMY N/A 12/13/2014   Procedure: CERVICAL LAMINECTOMY FOR INTRADURAL TUMOR;  Surgeon: Charlie Pitter, MD;  Location: [place] NEURO ORS;  Service: Neurosurgery;  Laterality: N/A;  posterior  . REDUCTION MAMMAPLASTY Bilateral 1998    OB History    Gravida Para Term Preterm AB Living   1             SAB TAB Ectopic Multiple Live Births  Home Medications    Prior to Admission medications   Medication Sig Start Date End Date Taking? Authorizing Provider  ACCU-CHEK FASTCLIX LANCETS MISC CHECK BLOOD SUGAR 2-3 TIMES DAILY 12/03/15   Historical Provider, MD  [place] test strip CHECK BLOOD SUGAR 2-3 TIMES DAILY 12/03/15   Historical Provider, MD  baclofen (LIORESAL) 10 MG tablet Start with 1/2 pill 3x a day and if no side effects can increase to a whole pill 3x a day. Watch for sedation. 08/12/16   Melvenia Beam, MD  Blood Glucose Monitoring Suppl (ACCU-CHEK NANO SMARTVIEW) w/Device KIT USE TO CHECK BLOOD SUGAR 2-3 TIMES DAILY 12/04/15   Historical Provider, MD  diazepam (VALIUM) 5 MG tablet take 1-2 tablets by mouth UP TO 3 TIMES DAILY FOR MUSCLE SPASTICITY 08/12/16   Melvenia Beam, MD  ferrous sulfate 325 (65  FE) MG EC tablet Take 325 mg by mouth daily.    Historical Provider, MD  glipiZIDE (GLUCOTROL) 10 MG tablet take 1 tablet (10 mg) by oral route 2 times per day before meals 01/08/16   Historical Provider, MD  hydrochlorothiazide (HYDRODIURIL) 25 MG tablet Take 25 mg by mouth daily. 08/13/15   Historical Provider, MD  insulin aspart protamine- aspart (NOVOLOG MIX 70/30) (70-30) 100 UNIT/ML injection Inject 40 Units into the skin 2 (two) times daily. Follow the sliding scale for insulin dosage.    Historical Provider, MD  oxyCODONE (ROXICODONE) 30 MG immediate release tablet Take 1 tablet (30 mg total) by mouth every 6 (six) hours as needed for pain. 01/02/16   Melvenia Beam, MD  ziprasidone (GEODON) 20 MG capsule Take 20 mg by mouth daily. Take one pill at bed time.    Historical Provider, MD  zolpidem (AMBIEN) 5 MG tablet Take 1 tablet by mouth daily. 03/20/16   Historical Provider, MD    Family History Family History  Problem Relation Age of Onset  . Diabetes Mother   . Hypertension Mother   . Breast cancer Maternal Aunt   . Ovarian cancer Maternal Grandmother   . Breast cancer Maternal Aunt   . Migraines Neg Hx     Social History Social History  Substance Use Topics  . Smoking status: Never Smoker  . Smokeless tobacco: Never Used     Comment: 03/27/2014 "smoked ~ 1 cigarette/day; quit in ~ 2013"  . Alcohol use 0.0 oz/week     Allergies   Morphine and related and Amoxicillin   Review of Systems Review of Systems  Musculoskeletal: Positive for arthralgias.  Neurological: Positive for weakness and numbness.  All other systems reviewed and are negative.    Physical Exam Updated Vital Signs BP 167/95 (BP Location: Left Arm)   Pulse 105   Temp 98.6 F (37 C) (Oral)   Resp 18   LMP 10/12/2016 Comment: pt states theres no chance she could be pregnant as she is not active  SpO2 100%   Physical Exam  Constitutional: She is oriented to person, place, and time. She appears  well-developed and well-nourished. No distress.  HENT:  Head: Normocephalic and atraumatic.  Eyes: Conjunctivae are normal.  Cardiovascular: Normal rate.   Pulmonary/Chest: Effort normal.  Abdominal: She exhibits no distension.  Musculoskeletal: She exhibits tenderness.  Decreased sensation with light touch. Bilateral lower extremity 2/5 strength from the hips distally bilaterally. Palpable DP pulse on left. Increased pain with left hip flexion and with nternal and external rotation.   Neurological: She is alert and oriented to person, place, and time.  Skin: Skin is warm and dry.  Psychiatric: She has a normal mood and affect.  Nursing note and vitals reviewed.    ED Treatments / Results  DIAGNOSTIC STUDIES:  Oxygen Saturation is 99% on RA, normal by my interpretation.    COORDINATION OF CARE:  12:21 PM Discussed treatment plan with pt at bedside and pt agreed to plan.  Labs (all labs ordered are listed, but only abnormal results are displayed) Labs Reviewed - No data to display  EKG  EKG Interpretation None       Radiology Dg Hip Unilat With Pelvis 2-3 Views Left  Result Date: 10/26/2016 CLINICAL DATA:  Fall 2 days ago.  Severe left hip pain. EXAM: DG HIP (WITH OR WITHOUT PELVIS) 2-3V LEFT COMPARISON:  May 11, 2016 FINDINGS: There is no evidence of hip fracture or dislocation. There is no evidence of arthropathy or other focal bone abnormality. IMPRESSION: Negative. Electronically Signed   By: Dorise Bullion III M.D   On: 10/26/2016 12:04    Procedures Procedures (including critical care time)  Medications Ordered in ED Medications - No data to display   Initial Impression / Assessment and Plan / ED Course  I have reviewed the triage vital signs and the nursing notes.  Pertinent labs & imaging results that were available during my care of the patient were reviewed by me and considered in my medical decision making (see chart for details).  Clinical Course      35 year old female with left hip and buttock pain after fall. She has weakness and decreased sensation in bilateral lower extremities, but really she reports that this is chronic/stable. Imaging today is without acute abnormality. She was treated symptomatically in the emergency room. She is chronically on baclofen, Valium and oxycodone. She continued continue to take these as needed for pain. She can follow-up as needed with orthopedist.  1:53 PM Initial plan was for discharge, but pt now saying she cannot walk, even with her walker. Will CT.   3:47 PM CT negative as well. Advised to follow-up with her orthopedist.   Final Clinical Impressions(s) / ED Diagnoses   Final diagnoses:  Left hip pain    New Prescriptions New Prescriptions   No medications on file   I personally preformed the services scribed in my presence. The recorded information has been reviewed is accurate. Virgel Manifold, MD.       Virgel Manifold, MD 10/26/16 [phone]

## 2016-10-26 NOTE — ED Notes (Signed)
Patient transported to X-ray 

## 2016-10-26 NOTE — ED Triage Notes (Signed)
Pt states that she had a fall 2 days ago. Pt states that her hips "just gave out" pt states that she was told she needs a hip replacement. Pt states that she has numbness in her legs at baseline. Pt reports pain in left hip today from fall.

## 2016-10-26 NOTE — Discharge Instructions (Signed)
Your x-rays today are negative for a fracture (break). Take your home pain medication as prescribed. Follow-up with your orthopedist as needed.

## 2016-12-03 NOTE — Telephone Encounter (Signed)
Re-faxed signed request for prior approval for tens unit to [employer]. Fax: [phone]. Received confirmation.

## 2016-12-09 ENCOUNTER — Encounter (HOSPITAL_COMMUNITY): Payer: Self-pay | Admitting: Emergency Medicine

## 2016-12-09 ENCOUNTER — Emergency Department (HOSPITAL_COMMUNITY)
Admission: EM | Admit: 2016-12-09 | Discharge: 2016-12-09 | Disposition: A | Discharge status: Home / Self Care | Payer: Medicaid Other | Attending: Emergency Medicine | Admitting: Emergency Medicine

## 2016-12-09 DIAGNOSIS — J45909 Unspecified asthma, uncomplicated: Secondary | ICD-10-CM | POA: Diagnosis not present

## 2016-12-09 DIAGNOSIS — I10 Essential (primary) hypertension: Secondary | ICD-10-CM | POA: Insufficient documentation

## 2016-12-09 DIAGNOSIS — Z794 Long term (current) use of insulin: Secondary | ICD-10-CM | POA: Diagnosis not present

## 2016-12-09 DIAGNOSIS — M791 Myalgia: Secondary | ICD-10-CM | POA: Diagnosis not present

## 2016-12-09 DIAGNOSIS — Y92009 Unspecified place in unspecified non-institutional (private) residence as the place of occurrence of the external cause: Secondary | ICD-10-CM | POA: Diagnosis not present

## 2016-12-09 DIAGNOSIS — Y999 Unspecified external cause status: Secondary | ICD-10-CM | POA: Insufficient documentation

## 2016-12-09 DIAGNOSIS — Y939 Activity, unspecified: Secondary | ICD-10-CM | POA: Diagnosis not present

## 2016-12-09 DIAGNOSIS — E119 Type 2 diabetes mellitus without complications: Secondary | ICD-10-CM | POA: Diagnosis not present

## 2016-12-09 NOTE — Discharge Instructions (Signed)
Please read attached information. If you experience any new or worsening signs or symptoms please return to the emergency room for evaluation. Please follow-up with your primary care provider or specialist as discussed.  °
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
## 2016-12-14 ENCOUNTER — Encounter: Payer: Self-pay | Admitting: Neurology

## 2016-12-14 ENCOUNTER — Ambulatory Visit (INDEPENDENT_AMBULATORY_CARE_PROVIDER_SITE_OTHER): Payer: Medicaid Other | Admitting: Neurology

## 2016-12-14 VITALS — BP 132/90 | HR 108 | Ht 63.0 in | Wt 326.0 lb

## 2016-12-14 DIAGNOSIS — G8250 Quadriplegia, unspecified: Secondary | ICD-10-CM

## 2016-12-14 DIAGNOSIS — S14101S Unspecified injury at C1 level of cervical spinal cord, sequela: Secondary | ICD-10-CM
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
## 2016-12-17 ENCOUNTER — Encounter: Payer: Self-pay | Admitting: Neurology

## 2016-12-17 ENCOUNTER — Telehealth: Payer: Self-pay | Admitting: Neurology

## 2016-12-17 NOTE — Telephone Encounter (Signed)
Cynthia Rosales, can you call patient and let her know letter is ready for her to pick up today? Please print it for me if you can, thanks

## 2016-12-18 NOTE — Telephone Encounter (Signed)
Called and spoke to pt. Advised letter ready for pt to pick up. She states the courts are closed until Monday. Advised I will place up front for her to pick up at her convenience. She verbalized understanding.

## 2017-01-19 ENCOUNTER — Other Ambulatory Visit: Payer: Self-pay | Admitting: Neurology

## 2017-03-10 ENCOUNTER — Encounter (HOSPITAL_COMMUNITY): Payer: Self-pay

## 2017-03-10 ENCOUNTER — Emergency Department (HOSPITAL_COMMUNITY): Payer: Medicaid Other

## 2017-03-10 ENCOUNTER — Emergency Department (HOSPITAL_COMMUNITY)
Admission: EM | Admit: 2017-03-10 | Discharge: 2017-03-10 | Disposition: A | Discharge status: Home / Self Care | Payer: Medicaid Other | Attending: Emergency Medicine | Admitting: Emergency Medicine

## 2017-03-10 DIAGNOSIS — R791 Abnormal coagulation profile: Secondary | ICD-10-CM | POA: Insufficient documentation

## 2017-03-10 DIAGNOSIS — I10 Essential (primary) hypertension: Secondary | ICD-10-CM | POA: Diagnosis not present

## 2017-03-10 DIAGNOSIS — Z79899 Other long term (current) drug therapy: Secondary | ICD-10-CM | POA: Diagnosis not present

## 2017-03-10 DIAGNOSIS — E119 Type 2 diabetes mellitus without complications: Secondary | ICD-10-CM | POA: Diagnosis not present

## 2017-03-10 DIAGNOSIS — J45909 Unspecified asthma, uncomplicated: Secondary | ICD-10-CM | POA: Diagnosis not present

## 2017-03-10 DIAGNOSIS — R202 Paresthesia of skin: Secondary | ICD-10-CM

## 2017-03-10 DIAGNOSIS — R531 Weakness: Secondary | ICD-10-CM

## 2017-03-10 DIAGNOSIS — Z794 Long term (current) use of insulin: Secondary | ICD-10-CM | POA: Insufficient documentation

## 2017-03-10 DIAGNOSIS — R200 Anesthesia of skin: Secondary | ICD-10-CM | POA: Diagnosis present

## 2017-03-10 LAB — I-STAT CHEM 8, ED
BUN: 14 mg/dL (ref 6–20)
Calcium, Ion: 1.05 mmol/L — ABNORMAL LOW (ref 1.15–1.40)
Chloride: 101 mmol/L (ref 101–111)
Creatinine, Ser: 0.9 mg/dL (ref 0.44–1.00)
Glucose, Bld: 111 mg/dL — ABNORMAL HIGH (ref 65–99)
HCT: 35 % — ABNORMAL LOW (ref 36.0–46.0)
Hemoglobin: 11.9 g/dL — ABNORMAL LOW (ref 12.0–15.0)
Potassium: 3.8 mmol/L (ref 3.5–5.1)
Sodium: 137 mmol/L (ref 135–145)
TCO2: 28 mmol/L (ref 0–100)

## 2017-03-10 LAB — COMPREHENSIVE METABOLIC PANEL
ALT: 11 U/L — ABNORMAL LOW (ref 14–54)
AST: 16 U/L (ref 15–41)
Albumin: 3.2 g/dL — ABNORMAL LOW (ref 3.5–5.0)
Alkaline Phosphatase: 77 U/L (ref 38–126)
Anion gap: 12 (ref 5–15)
BUN: 12 mg/dL (ref 6–20)
CO2: 24 mmol/L (ref 22–32)
Calcium: 8.8 mg/dL — ABNORMAL LOW (ref 8.9–10.3)
Chloride: 100 mmol/L — ABNORMAL LOW (ref 101–111)
Creatinine, Ser: 0.85 mg/dL (ref 0.44–1.00)
GFR calc Af Amer: >60 mL/min (ref >60)
GFR calc non Af Amer: >60 mL/min (ref >60)
Glucose, Bld: 124 mg/dL — ABNORMAL HIGH (ref 65–99)
Potassium: 3.4 mmol/L — ABNORMAL LOW (ref 3.5–5.1)
Sodium: 136 mmol/L (ref 135–145)
Total Bilirubin: 0.3 mg/dL (ref 0.3–1.2)
Total Protein: 7.5 g/dL (ref 6.5–8.1)

## 2017-03-10 LAB — DIFFERENTIAL
Basophils Absolute: 0 10*3/uL (ref 0.0–0.1)
Basophils Relative: 0 %
Eosinophils Absolute: 0 10*3/uL (ref 0.0–0.7)
Eosinophils Relative: 0 %
Lymphocytes Relative: 23 %
Lymphs Abs: 2.7 10*3/uL (ref 0.7–4.0)
Monocytes Absolute: 0.5 10*3/uL (ref 0.1–1.0)
Monocytes Relative: 4 %
Neutro Abs: 8.6 10*3/uL — ABNORMAL HIGH (ref 1.7–7.7)
Neutrophils Relative %: 73 %

## 2017-03-10 LAB — PROTIME-INR
INR: 0.93
Prothrombin Time: 12.5 seconds (ref 11.4–15.2)

## 2017-03-10 LAB — I-STAT TROPONIN, ED: Troponin i, poc: 0.01 ng/mL (ref 0.00–0.08)

## 2017-03-10 LAB — CBC
HCT: 31.1 % — ABNORMAL LOW (ref 36.0–46.0)
Hemoglobin: 9.9 g/dL — ABNORMAL LOW (ref 12.0–15.0)
MCH: 20.9 pg — ABNORMAL LOW (ref 26.0–34.0)
MCHC: 31.8 g/dL (ref 30.0–36.0)
MCV: 65.6 fL — ABNORMAL LOW (ref 78.0–100.0)
Platelets: 410 10*3/uL — ABNORMAL HIGH (ref 150–400)
RBC: 4.74 MIL/uL (ref 3.87–5.11)
RDW: 17.6 % — ABNORMAL HIGH (ref 11.5–15.5)
WBC: 11.8 10*3/uL — ABNORMAL HIGH (ref 4.0–10.5)

## 2017-03-10 LAB — APTT: aPTT: 29 seconds (ref 24–36)

## 2017-03-10 MED ORDER — GADOBENATE DIMEGLUMINE 529 MG/ML IV SOLN
20.0000 mL | Freq: Once | INTRAVENOUS | Status: AC
  Administered 2017-03-10: 20 mL via INTRAVENOUS

## 2017-03-10 MED ORDER — HYDROMORPHONE HCL 1 MG/ML IJ SOLN
1.0000 mg | Freq: Once | INTRAMUSCULAR | Status: AC
  Administered 2017-03-10: 1 mg via INTRAVENOUS
  Filled 2017-03-10: qty 1

## 2017-03-10 MED ORDER — DIPHENHYDRAMINE HCL 50 MG/ML IJ SOLN
12.5000 mg | Freq: Once | INTRAMUSCULAR | Status: AC
  Administered 2017-03-10: 12.5 mg via INTRAVENOUS
  Filled 2017-03-10: qty 1

## 2017-03-10 MED ORDER — ONDANSETRON HCL 4 MG/2ML IJ SOLN
4.0000 mg | Freq: Once | INTRAMUSCULAR | Status: AC
  Administered 2017-03-10: 4 mg via INTRAVENOUS
  Filled 2017-03-10: qty 2

## 2017-03-10 MED ORDER — LORAZEPAM 2 MG/ML IJ SOLN
1.0000 mg | Freq: Once | INTRAMUSCULAR | Status: AC
  Administered 2017-03-10: 1 mg via INTRAVENOUS
  Filled 2017-03-10: qty 1

## 2017-03-10 MED ORDER — OXYCODONE HCL ER 10 MG PO T12A
30.0000 mg | EXTENDED_RELEASE_TABLET | Freq: Once | ORAL | Status: AC
  Administered 2017-03-10: 30 mg via ORAL

## 2017-03-10 MED ORDER — OXYCODONE HCL ER 10 MG PO T12A
30.0000 mg | EXTENDED_RELEASE_TABLET | Freq: Two times a day (BID) | ORAL | Status: DC
  Filled 2017-03-10: qty 3

## 2017-03-10 NOTE — ED Provider Notes (Signed)
[facility] DEPT Provider Note   CSN: [id number] Arrival date & time: 03/10/17  1275     History   Chief Complaint Chief Complaint  Patient presents with  . Numbness    HPI Cynthia Rosales is a 36 y.o. female.  The history is provided by the patient. No language interpreter was used.  Weakness  Primary symptoms include focal weakness. This is a new problem. The current episode started yesterday. There was right upper extremity and right lower extremity focality noted. There has been no fever. Pertinent negatives include no shortness of breath. Associated medical issues do not include trauma.   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  Past Medical History:  Diagnosis Date  . Abscess of bursa, left elbow 06/2013   Treated with I and D/antibiotics.   . Anemia   . Asthma   . Depression   . History of blood transfusion    "after I had one of my kids"  . Hypertension   . Ovarian cyst   . Renal disorder    kidney stones  . Sickle cell disease ([place])   . Type II diabetes mellitus [facility])     Patient Active Problem List   Diagnosis Date Noted  . 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  . Neurogenic bladder 08/23/2015  . Bilateral carpal tunnel syndrome 08/23/2015  . Paraparesis of both lower limbs ([place]) 07/14/2015  . Ileus, postoperative ([place]) 12/25/2014  . Sickle cell disease without crisis ([place]) 12/25/2014  . Diabetes mellitus type 2 in obese ([place]) 12/25/2014  . 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[place]) 12/24/2014  . 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 Ileus of unspecified type ([place])   . Atelectasis   . Bowel obstruction   . Encounter for central line care   . Encounter for nasogastric (NG) tube placement   . Ileus ([place])   . Intestinal occlusion   . 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  . Headache 11/19/2014  . Neck pain 11/19/2014  . Right sided weakness 11/19/2014    . Paresthesias 11/19/2014  . Nightmares 09/11/2014  . HTN (hypertension), benign 03/28/2014  . Nephrolithiasis 03/27/2014  . Diabetes mellitus ([place]) 03/27/2014  . Abdominal pain 03/27/2014    Past Surgical History:  Procedure Laterality Date  . APPENDECTOMY  2013  . CARPAL TUNNEL RELEASE    . CESAREAN SECTION  2000; 2007; 2011  . DILATION AND CURETTAGE OF UTERUS    . LAMINECTOMY N/A 12/13/2014   Procedure: CERVICAL LAMINECTOMY FOR INTRADURAL TUMOR;  Surgeon: Charlie Pitter, MD;  Location: [facility];  Service: Neurosurgery;  Laterality: N/A;  posterior  . REDUCTION MAMMAPLASTY Bilateral 1998    OB History    Gravida Para Term Preterm AB Living   1             SAB TAB Ectopic Multiple Live Births                   Home Medications    Prior to Admission medications   Medication Sig Start Date End Date Taking? Authorizing Provider  ACCU-CHEK FASTCLIX LANCETS MISC CHECK BLOOD SUGAR 2-3 TIMES DAILY 12/03/15   Historical Provider, MD  [place] test strip CHECK BLOOD SUGAR 2-3 TIMES DAILY 12/03/15   Historical Provider, MD  baclofen (LIORESAL) 10 MG tablet Start with 1/2 pill 3x a day and if no side effects can increase to  a whole pill 3x a day. Watch for sedation. 08/12/16   Melvenia Beam, MD  Blood Glucose Monitoring Suppl (ACCU-CHEK NANO SMARTVIEW) w/Device KIT USE TO CHECK BLOOD SUGAR 2-3 TIMES DAILY 12/04/15   Historical Provider, MD  diazepam (VALIUM) 5 MG tablet take 1-2 tablets by mouth UP TO 3 TIMES DAILY FOR MUSCLE SPASTICITY 08/12/16   Melvenia Beam, MD  ferrous sulfate 325 (65 FE) MG EC tablet Take 325 mg by mouth daily.    Historical Provider, MD  hydrochlorothiazide (HYDRODIURIL) 25 MG tablet Take 25 mg by mouth daily. 08/13/15   Historical Provider, MD  insulin aspart protamine- aspart (NOVOLOG MIX 70/30) (70-30) 100 UNIT/ML injection Inject 40 Units into the skin 2 (two) times daily. Follow the sliding scale for insulin dosage.    Historical Provider, MD   oxyCODONE (ROXICODONE) 30 MG immediate release tablet Take 1 tablet (30 mg total) by mouth every 6 (six) hours as needed for pain. 01/02/16   Melvenia Beam, MD    Family History Family History  Problem Relation Age of Onset  . Diabetes Mother   . Hypertension Mother   . Breast cancer Maternal Aunt   . Ovarian cancer Maternal Grandmother   . Breast cancer Maternal Aunt   . Migraines Neg Hx     Social History Social History  Substance Use Topics  . Smoking status: Never Smoker  . Smokeless tobacco: Never Used     Comment: 03/27/2014 "smoked ~ 1 cigarette/day; quit in ~ 2013"  . Alcohol use 0.0 oz/week     Allergies   Morphine and related and Amoxicillin   Review of Systems Review of Systems  Respiratory: Negative for shortness of breath.   Neurological: Positive for focal weakness and weakness.  All other systems reviewed and are negative.    Physical Exam Updated Vital Signs BP 109/78 (BP Location: Right Arm)   Pulse 92   Temp 98.4 F (36.9 C)   Resp 18   LMP 01/24/2017   SpO2 99%   Physical Exam  Constitutional: She is oriented to person, place, and time. She appears well-developed and well-nourished.  HENT:  Head: Normocephalic and atraumatic.  Eyes: Conjunctivae and EOM are normal. Pupils are equal, round, and reactive to light.  Neck: Normal range of motion.  Cardiovascular: Normal rate and regular rhythm.   Pulmonary/Chest: Effort normal and breath sounds normal.  Abdominal: Soft. There is no tenderness.  Musculoskeletal:  Pt has limited range of motion right arm which is normal.  Pt able to move left arm. Good grip,  Decreased range of motion left leg, (Pt has chronic hip pain on left side)   Neurological: She is alert and oriented to person, place, and time. No cranial nerve deficit. Coordination normal.  Skin: Skin is warm.  Psychiatric: She has a normal mood and affect.  Nursing note and vitals reviewed.    ED Treatments / Results  Labs (all  labs ordered are listed, but only abnormal results are displayed) Labs Reviewed  CBC - Abnormal; Notable for the following:       Result Value   WBC 11.8 (*)    Hemoglobin 9.9 (*)    HCT 31.1 (*)    MCV 65.6 (*)    MCH 20.9 (*)    RDW 17.6 (*)    Platelets 410 (*)    All other components within normal limits  DIFFERENTIAL - Abnormal; Notable for the following:    Neutro Abs 8.6 (*)  All other components within normal limits  COMPREHENSIVE METABOLIC PANEL - Abnormal; Notable for the following:    Potassium 3.4 (*)    Chloride 100 (*)    Glucose, Bld 124 (*)    Calcium 8.8 (*)    Albumin 3.2 (*)    ALT 11 (*)    All other components within normal limits  I-STAT CHEM 8, ED - Abnormal; Notable for the following:    Glucose, Bld 111 (*)    Calcium, Ion 1.05 (*)    Hemoglobin 11.9 (*)    HCT 35.0 (*)    All other components within normal limits  PROTIME-INR  APTT  I-STAT TROPOININ, ED  CBG MONITORING, ED    EKG  EKG Interpretation None       Radiology Ct Head Wo Contrast  Result Date: 03/10/2017 CLINICAL DATA:  Right arm numbness and headache for 2 days EXAM: CT HEAD WITHOUT CONTRAST TECHNIQUE: Contiguous axial images were obtained from the base of the skull through the vertex without intravenous contrast. COMPARISON:  September 25, 2015 FINDINGS: Brain: The ventricles are normal in size and configuration. There is no intracranial mass, hemorrhage, extra-axial fluid collection, or midline shift. Gray-white compartments are normal. There is no evident acute infarct. Vascular: There is no hyperdense vessel. No vascular calcifications are evident. Skull: The bony calvarium appears intact. Sinuses/Orbits: Visualized paranasal sinuses are clear. Visualized orbits appear symmetric bilaterally. Other: Visualized mastoid air cells are clear. IMPRESSION: Study within normal limits. Electronically Signed   By: Lowella Grip III M.D.   On: 03/10/2017 10:48    Procedures Procedures  (including critical care time)  Medications Ordered in ED Medications - No data to display   Initial Impression / Assessment and Plan / ED Course  I have reviewed the triage vital signs and the nursing notes.  Pertinent labs & imaging results that were available during my care of the patient were reviewed by me and considered in my medical decision making (see chart for details).  Clinical Course as of Mar 10 1737  Wed Mar 10, 2017  1654 Hemoglobin: (!) 9.9 [LS]    Clinical Course User Index [LS] Fransico Meadow, [place]      Final Clinical Impressions(s) / ED Diagnoses   Final diagnoses:  Weakness    New Prescriptions Discharge Medication List as of 03/10/2017  6:11 PM       Fransico Meadow, PA-C 03/13/17 [place], MD 03/14/17 0930

## 2017-03-10 NOTE — ED Notes (Signed)
Was not able to get rest of blood

## 2017-03-10 NOTE — ED Notes (Signed)
[facility] contacted to transport patient to home 

## 2017-03-10 NOTE — ED Triage Notes (Signed)
Pt reports numbness to the entire right side of her body that started 2 days ago. 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 Pt also reports pain from left left shooting up to her left hip. She states her toes are spreading apart with the pain "it feels like a charlie horse."

## 2017-03-10 NOTE — ED Notes (Signed)
Patient transported to MRI 

## 2017-06-14 ENCOUNTER — Other Ambulatory Visit: Payer: Self-pay | Admitting: Neurosurgery

## 2017-06-14 ENCOUNTER — Ambulatory Visit: Payer: Medicaid Other | Admitting: Neurology

## 2017-06-14 DIAGNOSIS — C719 Malignant neoplasm of brain, unspecified: Secondary | ICD-10-CM

## 2017-06-27 ENCOUNTER — Other Ambulatory Visit: Payer: Self-pay

## 2017-06-30 ENCOUNTER — Ambulatory Visit
Admission: RE | Admit: 2017-06-30 | Discharge: 2017-06-30 | Disposition: A | Discharge status: Home / Self Care | Payer: Medicaid Other | Source: Ambulatory Visit | Attending: Neurosurgery | Admitting: Neurosurgery

## 2017-06-30 DIAGNOSIS — C719 Malignant neoplasm of brain, unspecified: Secondary | ICD-10-CM

## 2017-06-30 MED ORDER — GADOBENATE DIMEGLUMINE 529 MG/ML IV SOLN
20.0000 mL | Freq: Once | INTRAVENOUS | Status: AC | PRN
  Administered 2017-06-30: 20 mL via INTRAVENOUS

## 2017-07-05 ENCOUNTER — Ambulatory Visit: Payer: Medicaid Other | Admitting: Neurology

## 2017-07-06 ENCOUNTER — Ambulatory Visit
Admission: RE | Admit: 2017-07-06 | Discharge: 2017-07-06 | Disposition: A | Discharge status: Home / Self Care | Payer: Medicaid Other | Source: Ambulatory Visit | Attending: Neurosurgery | Admitting: Neurosurgery

## 2017-07-06 DIAGNOSIS — C719 Malignant neoplasm of brain, unspecified: Secondary | ICD-10-CM

## 2017-07-06 MED ORDER — GADOBENATE DIMEGLUMINE 529 MG/ML IV SOLN
20.0000 mL | Freq: Once | INTRAVENOUS | Status: AC | PRN
  Administered 2017-07-06: 20 mL via INTRAVENOUS

## 2017-07-06 MED ORDER — ONDANSETRON 8 MG PO TBDP
8.0000 mg | ORAL_TABLET | Freq: Once | ORAL | Status: AC
  Administered 2017-07-06: 8 mg via ORAL

## 2017-07-16 ENCOUNTER — Inpatient Hospital Stay (HOSPITAL_COMMUNITY): Payer: Medicaid Other

## 2017-07-16 ENCOUNTER — Inpatient Hospital Stay (HOSPITAL_COMMUNITY)
Admission: AD | Admit: 2017-07-16 | Discharge: 2017-07-17 | Disposition: A | Discharge status: Home / Self Care | Payer: Medicaid Other | Source: Ambulatory Visit | Attending: Obstetrics and Gynecology | Admitting: Obstetrics and Gynecology

## 2017-07-16 ENCOUNTER — Encounter (HOSPITAL_COMMUNITY): Payer: Self-pay | Admitting: *Deleted

## 2017-07-16 DIAGNOSIS — Z3A08 8 weeks gestation of pregnancy: Secondary | ICD-10-CM | POA: Diagnosis not present

## 2017-07-16 DIAGNOSIS — O210 Mild hyperemesis gravidarum: Secondary | ICD-10-CM | POA: Diagnosis not present

## 2017-07-16 DIAGNOSIS — O26891 Other specified pregnancy related conditions, first trimester: Secondary | ICD-10-CM | POA: Diagnosis not present

## 2017-07-16 DIAGNOSIS — O219 Vomiting of pregnancy, unspecified: Secondary | ICD-10-CM

## 2017-07-16 DIAGNOSIS — O3680X0 Pregnancy with inconclusive fetal viability, not applicable or unspecified: Secondary | ICD-10-CM

## 2017-07-16 DIAGNOSIS — Z880 Allergy status to penicillin: Secondary | ICD-10-CM | POA: Diagnosis not present

## 2017-07-16 DIAGNOSIS — Z87891 Personal history of nicotine dependence: Secondary | ICD-10-CM | POA: Insufficient documentation

## 2017-07-16 DIAGNOSIS — R109 Unspecified abdominal pain: Secondary | ICD-10-CM | POA: Diagnosis not present

## 2017-07-16 DIAGNOSIS — O26899 Other specified pregnancy related conditions, unspecified trimester: Secondary | ICD-10-CM

## 2017-07-16 LAB — POCT PREGNANCY, URINE: Preg Test, Ur: POSITIVE — AB

## 2017-07-16 LAB — URINALYSIS, ROUTINE W REFLEX MICROSCOPIC
Bacteria, UA: NONE SEEN
Bilirubin Urine: NEGATIVE
Glucose, UA: 50 mg/dL — AB
Ketones, ur: NEGATIVE mg/dL
Nitrite: NEGATIVE
Protein, ur: 100 mg/dL — AB
Specific Gravity, Urine: 1.023 (ref 1.005–1.030)
pH: 5 (ref 5.0–8.0)

## 2017-07-16 LAB — CBC
HCT: 29.3 % — ABNORMAL LOW (ref 36.0–46.0)
Hemoglobin: 9.3 g/dL — ABNORMAL LOW (ref 12.0–15.0)
MCH: 20.2 pg — ABNORMAL LOW (ref 26.0–34.0)
MCHC: 31.7 g/dL (ref 30.0–36.0)
MCV: 63.6 fL — ABNORMAL LOW (ref 78.0–100.0)
Platelets: 463 10*3/uL — ABNORMAL HIGH (ref 150–400)
RBC: 4.61 MIL/uL (ref 3.87–5.11)
RDW: 19.2 % — ABNORMAL HIGH (ref 11.5–15.5)
WBC: 10.9 10*3/uL — ABNORMAL HIGH (ref 4.0–10.5)

## 2017-07-16 LAB — HCG, QUANTITATIVE, PREGNANCY: hCG, Beta Chain, Quant, S: 14899 m[IU]/mL — ABNORMAL HIGH (ref <5)

## 2017-07-16 MED ORDER — METOCLOPRAMIDE HCL 10 MG PO TABS
10.0000 mg | ORAL_TABLET | Freq: Four times a day (QID) | ORAL | Status: DC | PRN
  Administered 2017-07-17: 10 mg via ORAL
  Filled 2017-07-16: qty 1

## 2017-07-16 MED ORDER — PROMETHAZINE HCL 25 MG/ML IJ SOLN
12.5000 mg | Freq: Once | INTRAMUSCULAR | Status: AC
  Administered 2017-07-16: 12.5 mg via INTRAMUSCULAR
  Filled 2017-07-16: qty 1

## 2017-07-16 NOTE — MAU Provider Note (Signed)
History     CSN: [id number]  Arrival date and time: 07/16/17 5993   First Provider Initiated Contact with Patient 07/16/17 2022      Chief Complaint  Patient presents with  . Abdominal Pain  . Nausea  . Emesis   HPI   [name] is a 36 y.o. insulin diabetic with multiple medical conditions female [phone] @ [id number] in MAU with abdominal pain. The pain started 1.5 weeks ago. The pain is constant. The pain is at her belly button and feels like a pulling pain. She denies vaginal bleeding or vaginal discharge. The patient did not know she was pregnant prior to her arrival here.   [facility]   OB History    Gravida Para Term Preterm AB Living   _0 SAB TAB Ectopic Multiple Live Births           3      Past Medical History:  Diagnosis Date  . Abscess of bursa, left elbow 06/2013   Treated with I and D/antibiotics.   . Anemia   . Asthma   . Depression   . History of blood transfusion    "after I had one of my kids"  . Hypertension   . Ovarian cyst   . Renal disorder    kidney stones  . Sickle cell disease ([place]   . Type II diabetes mellitus ([place]     Past Surgical History:  Procedure Laterality Date  . APPENDECTOMY  2013  . CARPAL TUNNEL RELEASE    . CESAREAN SECTION  2000; 2007; 2011  . DILATION AND CURETTAGE OF UTERUS    . LAMINECTOMY N/A 12/13/2014   Procedure: CERVICAL LAMINECTOMY FOR INTRADURAL TUMOR;  Surgeon: HCharlie Pitter MD;  [facility];  Service: Neurosurgery;  Laterality: N/A;  posterior  . REDUCTION MAMMAPLASTY Bilateral 1998    Family History  Problem Relation Age of Onset  . Diabetes Mother   . Hypertension Mother   . Breast cancer Maternal Aunt   . Ovarian cancer Maternal Grandmother   . Breast cancer Maternal Aunt   . Migraines Neg Hx     Social History  Substance Use Topics  . Smoking status: Former Smoker    Years: 0.00  . Smokeless  tobacco: Never Used     Comment: 03/27/2014 "smoked ~ 1 cigarette/day; quit in ~ 2013"  . Alcohol use 0.0 oz/week    Allergies:  Allergies  Allergen Reactions  . Buprenorphine Hcl Itching and Nausea Only  . Morphine And Related Itching and Nausea Only  . Amoxicillin Hives and Rash    Has patient had a PCN reaction causing immediate rash, facial/tongue/throat swelling, SOB or lightheadedness with hypotension: Yes Has patient had a PCN reaction causing severe rash involving mucus membranes or skin necrosis: No Has patient had a PCN reaction that required hospitalization: No  Has patient had a PCN reaction occurring within the last 10 years: Yes If all of the above answers are "NO", then may proceed with Cephalosporin use.      Prescriptions Prior to Admission  Medication Sig Dispense Refill Last Dose  . albuterol (VENTOLIN HFA) 108 (90 Base) MCG/ACT inhaler Inhale 2 puffs into the lungs every 6 (six) hours as needed for wheezing or shortness of breath.   Past Month at Unknown time  . diazepam (VALIUM) 5 MG tablet take 1-2 tablets  by mouth UP TO 3 TIMES DAILY FOR MUSCLE SPASTICITY (Patient taking differently: Take 5-10 mg by mouth See admin instructions. UP TO 3 TIMES DAILY FOR MUSCLE SPASTICITY) 180 tablet 5 Past Week at Unknown time  . hydrochlorothiazide (HYDRODIURIL) 25 MG tablet Take 25 mg by mouth daily.  3 07/15/2017 at Unknown time  . insulin NPH-regular Human (NOVOLIN 70/30) (70-30) 100 UNIT/ML injection Inject 35 Units into the skin 3 (three) times daily before meals.   07/16/2017 at Unknown time  . oxyCODONE (ROXICODONE) 30 MG immediate release tablet Take 1 tablet (30 mg total) by mouth every 6 (six) hours as needed for pain. 120 tablet 0 Past Week at Unknown time  . SitaGLIPtin-MetFORMIN HCl (JANUMET XR) [phone] MG TB24 Take 1 tablet by mouth daily.   Past Week at Unknown time  . ACCU-CHEK FASTCLIX LANCETS MISC CHECK BLOOD SUGAR 2-3 TIMES DAILY  0 Taking  . ACCU-CHEK SMARTVIEW  test strip CHECK BLOOD SUGAR 2-3 TIMES DAILY  0 Taking  . baclofen (LIORESAL) 10 MG tablet Start with 1/2 pill 3x a day and if no side effects can increase to a whole pill 3x a day. Watch for sedation. (Patient not taking: Reported on 03/10/2017) 90 each 5 Not Taking at Unknown time  . Blood Glucose Monitoring Suppl (ACCU-CHEK NANO SMARTVIEW) w/Device KIT USE TO CHECK BLOOD SUGAR 2-3 TIMES DAILY  0 Taking  . ferrous sulfate 325 (65 FE) MG EC tablet Take 325 mg by mouth daily.   03/10/2017 at am  . lubiprostone (AMITIZA) 24 MCG capsule Take 24 mcg by mouth daily.   Past Week at Unknown time   Results for orders placed or performed during the hospital encounter of 07/16/17 (from the past 48 hour(s))  Urinalysis, Routine w reflex microscopic     Status: Abnormal   Collection Time: 07/16/17  7:41 PM  Result Value Ref Range   Color, Urine YELLOW YELLOW   APPearance HAZY (A) CLEAR   Specific Gravity, Urine 1.023 1.005 - 1.030   pH 5.0 5.0 - 8.0   Glucose, UA 50 (A) NEGATIVE mg/dL   Hgb urine dipstick SMALL (A) NEGATIVE   Bilirubin Urine NEGATIVE NEGATIVE   Ketones, ur NEGATIVE NEGATIVE mg/dL   Protein, ur 100 (A) NEGATIVE mg/dL   Nitrite NEGATIVE NEGATIVE   Leukocytes, UA TRACE (A) NEGATIVE   RBC / HPF 0-5 0 - 5 RBC/hpf   WBC, UA 6-30 0 - 5 WBC/hpf   Bacteria, UA NONE SEEN NONE SEEN   Squamous Epithelial / LPF 6-30 (A) NONE SEEN   Mucous PRESENT   Pregnancy, urine POC     Status: Abnormal   Collection Time: 07/16/17  8:02 PM  Result Value Ref Range   Preg Test, Ur POSITIVE (A) NEGATIVE    Comment:        THE SENSITIVITY OF THIS METHODOLOGY IS >24 mIU/mL   CBC     Status: Abnormal   Collection Time: 07/16/17  8:36 PM  Result Value Ref Range   WBC 10.9 (H) 4.0 - 10.5 K/uL   RBC 4.61 3.87 - 5.11 MIL/uL   Hemoglobin 9.3 (L) 12.0 - 15.0 g/dL   HCT 29.3 (L) 36.0 - 46.0 %   MCV 63.6 (L) 78.0 - 100.0 fL   MCH 20.2 (L) 26.0 - 34.0 pg   MCHC 31.7 30.0 - 36.0 g/dL   RDW 19.2 (H) 11.5 - 15.5  %   Platelets 463 (H) 150 - 400 K/uL  hCG, quantitative, pregnancy  Status: Abnormal   Collection Time: 07/16/17  8:36 PM  Result Value Ref Range   hCG, Beta Chain, Quant, S 14,899 (H) <5 mIU/mL    Comment:          GEST. AGE      CONC.  (mIU/mL)   <=1 WEEK        5 - 50     2 WEEKS       50 - 500     3 WEEKS       100 - 10,000     4 WEEKS     1,000 - 30,000     5 WEEKS     3,500 - 115,000   6-8 WEEKS     12,000 - 270,000    12 WEEKS     15,000 - 220,000        FEMALE AND NON-PREGNANT FEMALE:     LESS THAN 5 mIU/mL    Review of Systems  Constitutional: Negative for fever.  Gastrointestinal: Positive for nausea and vomiting.  Genitourinary: Negative for vaginal bleeding.   Physical Exam   Blood pressure (!) 144/83, pulse (!) 103, temperature 98.8 F (37.1 C), resp. rate 20, height _0  (1.626 m), weight (!) 311 lb (141.1 kg), last menstrual period 05/15/2017, not currently breastfeeding.  Physical Exam  Constitutional: She is oriented to person, place, and time. She appears well-developed and well-nourished. No distress.  Morbidly obese   Respiratory: Effort normal.  GI: Normal appearance. There is tenderness in the suprapubic area. There is no rigidity and no guarding.  Genitourinary:  Genitourinary Comments: Bimanual exam: Cervix closed Uterus non tender, enlarged  + suprapubic pain. Chaperone present for exam.   Neurological: She is alert and oriented to person, place, and time.  Skin: Skin is warm. She is not diaphoretic.  Psychiatric: Her behavior is normal.   Results for orders placed or performed during the hospital encounter of 07/16/17 (from the past 24 hour(s))  Urinalysis, Routine w reflex microscopic     Status: Abnormal   Collection Time: 07/16/17  7:41 PM  Result Value Ref Range   Color, Urine YELLOW YELLOW   APPearance HAZY (A) CLEAR   Specific Gravity, Urine 1.023 1.005 - 1.030   pH 5.0 5.0 - 8.0   Glucose, UA 50 (A) NEGATIVE mg/dL   Hgb urine  dipstick SMALL (A) NEGATIVE   Bilirubin Urine NEGATIVE NEGATIVE   Ketones, ur NEGATIVE NEGATIVE mg/dL   Protein, ur 100 (A) NEGATIVE mg/dL   Nitrite NEGATIVE NEGATIVE   Leukocytes, UA TRACE (A) NEGATIVE   RBC / HPF 0-5 0 - 5 RBC/hpf   WBC, UA 6-30 0 - 5 WBC/hpf   Bacteria, UA NONE SEEN NONE SEEN   Squamous Epithelial / LPF 6-30 (A) NONE SEEN   Mucous PRESENT   Pregnancy, urine POC     Status: Abnormal   Collection Time: 07/16/17  8:02 PM  Result Value Ref Range   Preg Test, Ur POSITIVE (A) NEGATIVE  CBC     Status: Abnormal   Collection Time: 07/16/17  8:36 PM  Result Value Ref Range   WBC 10.9 (H) 4.0 - 10.5 K/uL   RBC 4.61 3.87 - 5.11 MIL/uL   Hemoglobin 9.3 (L) 12.0 - 15.0 g/dL   HCT 29.3 (L) 36.0 - 46.0 %   MCV 63.6 (L) 78.0 - 100.0 fL   MCH 20.2 (L) 26.0 - 34.0 pg   MCHC 31.7 30.0 - 36.0 g/dL   RDW 19.2 (  H) 11.5 - 15.5 %   Platelets 463 (H) 150 - 400 K/uL  hCG, quantitative, pregnancy     Status: Abnormal   Collection Time: 07/16/17  8:36 PM  Result Value Ref Range   hCG, Beta Chain, Quant, S 14,899 (H) <5 mIU/mL   US Ob Comp Less 14 Wks  Result Date: 07/16/2017 CLINICAL DATA:  Abdominal pain, quantitative HCG greater than 14,000 EXAM: OBSTETRIC <14 WK [place] AND TRANSVAGINAL OB US TECHNIQUE: Both transabdominal and transvaginal ultrasound examinations were performed for complete evaluation of the gestation as well as the maternal uterus, adnexal regions, and pelvic cul-de-sac. Transvaginal technique was performed to assess early pregnancy. COMPARISON:  None. FINDINGS: Difficult scan due to body habitus and bowel gas. Intrauterine gestational sac: Questionable intrauterine fluid collection. Yolk sac:  Not visualized Embryo:  Not visualized MSD: 8.4   mm   5 w   4  d Subchorionic hemorrhage:  None visualized. Maternal uterus/adnexae: Multiple cervical cysts. Right adnexa and ovary obscured by bowel gas. Left ovary measures 4.4 x 3.6 x 3.3 cm and may contain corpus luteal cyst. No  significant free fluid. IMPRESSION: 1. Difficult study due to body habitus and bowel gas which obscures right ovary and adnexa. 2. Questionable fluid collection/small gestational sac within the uterus, and no yolk sac or embryo is visualized. Recommend trending of beta HCG with repeat pelvic ultrasound as indicated. Electronically Signed   By: Donavan Foil M.D.   On: 07/16/2017 23:30   US Ob Transvaginal  Result Date: 07/16/2017 CLINICAL DATA:  Abdominal pain, quantitative HCG greater than 14,000 EXAM: OBSTETRIC <14 WK [place] AND TRANSVAGINAL OB US TECHNIQUE: Both transabdominal and transvaginal ultrasound examinations were performed for complete evaluation of the gestation as well as the maternal uterus, adnexal regions, and pelvic cul-de-sac. Transvaginal technique was performed to assess early pregnancy. COMPARISON:  None. FINDINGS: Difficult scan due to body habitus and bowel gas. Intrauterine gestational sac: Questionable intrauterine fluid collection. Yolk sac:  Not visualized Embryo:  Not visualized MSD: 8.4   mm   5 w   4  d Subchorionic hemorrhage:  None visualized. Maternal uterus/adnexae: Multiple cervical cysts. Right adnexa and ovary obscured by bowel gas. Left ovary measures 4.4 x 3.6 x 3.3 cm and may contain corpus luteal cyst. No significant free fluid. IMPRESSION: 1. Difficult study due to body habitus and bowel gas which obscures right ovary and adnexa. 2. Questionable fluid collection/small gestational sac within the uterus, and no yolk sac or embryo is visualized. Recommend trending of beta HCG with repeat pelvic ultrasound as indicated. Electronically Signed   By: Donavan Foil M.D.   On: 07/16/2017 23:30   MAU Course  Procedures  Phenergan Reglan  MDM HIV, CBC, Hcg, ABO US OB transvaginal  Report given to [place] who resumes care of the patient. Patient awaiting [place].  Labs and [place] reviewed. Appears to have early pregnancy on [place] but cannot r/o failed pregnancy, or ectopic.  Will follow bHCG. Minimal improvement after Phenergan, will Rx Reglan. Nausea improved after Reglan. Stable for discharge home.  Assessment and Plan   1. Pregnancy, location unknown   2. Abdominal pain in pregnancy, antepartum   3. Nausea and vomiting during pregnancy    Discharge home Follow up in [place] on 07/19/17 at 8am for quant HCG SAB/ectopic precautions Tylenol prn  Allergies as of 07/17/2017      Reactions   Buprenorphine Hcl Itching, Nausea Only   Morphine And Related Itching, Nausea Only  Amoxicillin Hives, Rash   Has patient had a PCN reaction causing immediate rash, facial/tongue/throat swelling, SOB or lightheadedness with hypotension: Yes Has patient had a PCN reaction causing severe rash involving mucus membranes or skin necrosis: No Has patient had a PCN reaction that required hospitalization: No  Has patient had a PCN reaction occurring within the last 10 years: Yes If all of the above answers are "NO", then may proceed with Cephalosporin use.      Medication List    STOP taking these medications   AMITIZA 24 MCG capsule Generic drug:  lubiprostone   diazepam 5 MG tablet Commonly known as:  VALIUM   ferrous sulfate 325 (65 FE) MG EC tablet   oxycodone 30 MG immediate release tablet Commonly known as:  ROXICODONE     TAKE these medications   ACCU-CHEK FASTCLIX LANCETS Misc CHECK BLOOD SUGAR 2-3 TIMES DAILY   ACCU-CHEK NANO SMARTVIEW w/Device Kit USE TO CHECK BLOOD SUGAR 2-3 TIMES DAILY   ACCU-CHEK SMARTVIEW test strip Generic drug:  glucose blood CHECK BLOOD SUGAR 2-3 TIMES DAILY   baclofen 10 MG tablet Commonly known as:  LIORESAL Start with 1/2 pill 3x a day and if no side effects can increase to a whole pill 3x a day. Watch for sedation.   hydrochlorothiazide 25 MG tablet Commonly known as:  HYDRODIURIL Take 25 mg by mouth daily.   JANUMET XR [phone] MG Tb24 Generic drug:  SitaGLIPtin-MetFORMIN HCl Take 1 tablet by mouth daily.    metoCLOPramide 10 MG tablet Commonly known as:  REGLAN Take 1 tablet (10 mg total) by mouth every 6 (six) hours as needed for nausea or vomiting.   NOVOLIN 70/30 (70-30) 100 UNIT/ML injection Generic drug:  insulin NPH-regular Human Inject 35 Units into the skin 3 (three) times daily before meals.   VENTOLIN HFA 108 (90 Base) MCG/ACT inhaler Generic drug:  albuterol Inhale 2 puffs into the lungs every 6 (six) hours as needed for wheezing or shortness of breath.      Julianne Handler, CNM  07/17/2017 12:04 AM

## 2017-07-16 NOTE — MAU Note (Signed)
Usually have irregular periods. LMP 05/15/17. Took PCN for tooth problem and then had vaginal itching and d/c. Afterward taking PCN started with n/v for past 2wks. Having pain in belly button area like a pull. No further itching or d/c. Pt took Diflucan and helped. No birth control. Had spinal tumor and has decreased feeling lower extremities.

## 2017-07-17 DIAGNOSIS — O219 Vomiting of pregnancy, unspecified: Secondary | ICD-10-CM

## 2017-07-17 DIAGNOSIS — O26891 Other specified pregnancy related conditions, first trimester: Secondary | ICD-10-CM | POA: Diagnosis not present

## 2017-07-17 DIAGNOSIS — R109 Unspecified abdominal pain: Secondary | ICD-10-CM

## 2017-07-17 LAB — HIV ANTIBODY (ROUTINE TESTING W REFLEX): HIV Screen 4th Generation wRfx: NONREACTIVE

## 2017-07-17 MED ORDER — METOCLOPRAMIDE HCL 10 MG PO TABS: 10.0000 mg | ORAL_TABLET | Freq: Four times a day (QID) | ORAL | 0 refills | Status: DC | PRN

## 2017-07-17 NOTE — Discharge Instructions (Signed)

## 2017-07-17 NOTE — Progress Notes (Signed)
[place] and verbal d/c instructions given and understanding voiced.  

## 2017-07-19 ENCOUNTER — Ambulatory Visit: Payer: Medicaid Other | Admitting: General Practice

## 2017-07-19 ENCOUNTER — Encounter (HOSPITAL_COMMUNITY): Payer: Self-pay

## 2017-07-19 ENCOUNTER — Inpatient Hospital Stay (HOSPITAL_COMMUNITY)
Admission: AD | Admit: 2017-07-19 | Discharge: 2017-07-19 | Disposition: A | Discharge status: Home / Self Care | Payer: Medicaid Other | Source: Ambulatory Visit | Attending: Obstetrics and Gynecology | Admitting: Obstetrics and Gynecology

## 2017-07-19 ENCOUNTER — Ambulatory Visit (HOSPITAL_COMMUNITY)
Admission: RE | Admit: 2017-07-19 | Discharge: 2017-07-19 | Disposition: A | Discharge status: Home / Self Care | Payer: Medicaid Other | Source: Ambulatory Visit | Attending: Obstetrics and Gynecology | Admitting: Obstetrics and Gynecology

## 2017-07-19 ENCOUNTER — Other Ambulatory Visit: Payer: Self-pay | Admitting: Obstetrics and Gynecology

## 2017-07-19 DIAGNOSIS — Z3490 Encounter for supervision of normal pregnancy, unspecified, unspecified trimester: Secondary | ICD-10-CM | POA: Diagnosis not present

## 2017-07-19 DIAGNOSIS — O26891 Other specified pregnancy related conditions, first trimester: Secondary | ICD-10-CM

## 2017-07-19 DIAGNOSIS — Z3A09 9 weeks gestation of pregnancy: Secondary | ICD-10-CM | POA: Diagnosis not present

## 2017-07-19 DIAGNOSIS — N9489 Other specified conditions associated with female genital organs and menstrual cycle: Secondary | ICD-10-CM

## 2017-07-19 DIAGNOSIS — O3680X0 Pregnancy with inconclusive fetal viability, not applicable or unspecified: Secondary | ICD-10-CM

## 2017-07-19 DIAGNOSIS — O009 Unspecified ectopic pregnancy without intrauterine pregnancy: Secondary | ICD-10-CM | POA: Diagnosis not present

## 2017-07-19 DIAGNOSIS — R109 Unspecified abdominal pain: Secondary | ICD-10-CM

## 2017-07-19 LAB — CBC WITH DIFFERENTIAL/PLATELET
Basophils Absolute: 0 10*3/uL (ref 0.0–0.1)
Basophils Relative: 0 %
Eosinophils Absolute: 0.1 10*3/uL (ref 0.0–0.7)
Eosinophils Relative: 1 %
HCT: 28.8 % — ABNORMAL LOW (ref 36.0–46.0)
Hemoglobin: 9.2 g/dL — ABNORMAL LOW (ref 12.0–15.0)
Lymphocytes Relative: 34 %
Lymphs Abs: 3.7 10*3/uL (ref 0.7–4.0)
MCH: 20.3 pg — ABNORMAL LOW (ref 26.0–34.0)
MCHC: 31.9 g/dL (ref 30.0–36.0)
MCV: 63.6 fL — ABNORMAL LOW (ref 78.0–100.0)
Monocytes Absolute: 0.2 10*3/uL (ref 0.1–1.0)
Monocytes Relative: 1 %
Neutro Abs: 6.9 10*3/uL (ref 1.7–7.7)
Neutrophils Relative %: 64 %
Platelets: 451 10*3/uL — ABNORMAL HIGH (ref 150–400)
RBC: 4.53 MIL/uL (ref 3.87–5.11)
RDW: 19.2 % — ABNORMAL HIGH (ref 11.5–15.5)
WBC: 10.9 10*3/uL — ABNORMAL HIGH (ref 4.0–10.5)

## 2017-07-19 LAB — COMPREHENSIVE METABOLIC PANEL
ALT: 11 U/L — ABNORMAL LOW (ref 14–54)
AST: 11 U/L — ABNORMAL LOW (ref 15–41)
Albumin: 3.3 g/dL — ABNORMAL LOW (ref 3.5–5.0)
Alkaline Phosphatase: 75 U/L (ref 38–126)
Anion gap: 9 (ref 5–15)
BUN: 8 mg/dL (ref 6–20)
CO2: 22 mmol/L (ref 22–32)
Calcium: 8.6 mg/dL — ABNORMAL LOW (ref 8.9–10.3)
Chloride: 101 mmol/L (ref 101–111)
Creatinine, Ser: 0.7 mg/dL (ref 0.44–1.00)
GFR calc Af Amer: >60 mL/min (ref >60)
GFR calc non Af Amer: >60 mL/min (ref >60)
Glucose, Bld: 180 mg/dL — ABNORMAL HIGH (ref 65–99)
Potassium: 3.8 mmol/L (ref 3.5–5.1)
Sodium: 132 mmol/L — ABNORMAL LOW (ref 135–145)
Total Bilirubin: 0.5 mg/dL (ref 0.3–1.2)
Total Protein: 7.9 g/dL (ref 6.5–8.1)

## 2017-07-19 LAB — CULTURE, OB URINE
Culture: 50000 — AB
Special Requests: NORMAL

## 2017-07-19 LAB — HCG, QUANTITATIVE, PREGNANCY: hCG, Beta Chain, Quant, S: 22154 m[IU]/mL — ABNORMAL HIGH (ref <5)

## 2017-07-19 MED ORDER — METHOTREXATE INJECTION FOR WOMEN'S HOSPITAL
50.0000 mg/m2 | Freq: Once | INTRAMUSCULAR | Status: AC
  Administered 2017-07-19: 125 mg via INTRAMUSCULAR
  Filled 2017-07-19: qty 2.5

## 2017-07-19 NOTE — MAU Provider Note (Signed)
S:   Cynthia Rosales. Is a 36 y.o. female [phone] [place].  The patient was seen on Saturday with abdominal pain. She was brought back today for a repeat quant. She notified the nurse that she having pain in her abdomen. [place] and discussion of ectopic pregnancy.  She continues to have some pain in her lower abdomen. The pain is not worse in intensity. The pain comes and goes. Denies vaginal bleeding    O:  GENERAL: Well-developed, well-nourished female in no acute distress.  LUNGS: Effort normal ABDOMEN: soft, non tender  SKIN: Warm, dry and without erythema PSYCH: Normal mood and affect  Vitals:   07/19/17 1407  BP: 133/72  Pulse: 95  Resp: 18  Temp: 98.8 F (37.1 C)  SpO2: 100%    MDM  CBC, & CP O positive blood type.  Dr. ERip Harbourto Mau to discuss and counsel for MTX administration.  Quant today 22,000, patient is a poor surgical candidate. Discussed with the patient who agrees to proceed with MTX as first option for management.   Results for orders placed or performed during the hospital encounter of 07/19/17 (from the past 48 hour(s))  CBC WITH DIFFERENTIAL     Status: Abnormal   Collection Time: 07/19/17  1:22 PM  Result Value Ref Range   WBC 10.9 (H) 4.0 - 10.5 K/uL   RBC 4.53 3.87 - 5.11 MIL/uL   Hemoglobin 9.2 (L) 12.0 - 15.0 g/dL   HCT 28.8 (L) 36.0 - 46.0 %   MCV 63.6 (L) 78.0 - 100.0 fL   MCH 20.3 (L) 26.0 - 34.0 pg   MCHC 31.9 30.0 - 36.0 g/dL   RDW 19.2 (H) 11.5 - 15.5 %   Platelets 451 (H) 150 - 400 K/uL   Neutrophils Relative % 64 %   Neutro Abs 6.9 1.7 - 7.7 K/uL   Lymphocytes Relative 34 %   Lymphs Abs 3.7 0.7 - 4.0 K/uL   Monocytes Relative 1 %   Monocytes Absolute 0.2 0.1 - 1.0 K/uL   Eosinophils Relative 1 %   Eosinophils Absolute 0.1 0.0 - 0.7 K/uL   Basophils Relative 0 %   Basophils Absolute 0.0 0.0 - 0.1 K/uL  Comprehensive metabolic panel      Status: Abnormal   Collection Time: 07/19/17  1:22 PM  Result Value Ref Range   Sodium 132 (L) 135 - 145 mmol/L   Potassium 3.8 3.5 - 5.1 mmol/L   Chloride 101 101 - 111 mmol/L   CO2 22 22 - 32 mmol/L   Glucose, Bld 180 (H) 65 - 99 mg/dL   BUN 8 6 - 20 mg/dL   Creatinine, Ser 0.70 0.44 - 1.00 mg/dL   Calcium 8.6 (L) 8.9 - 10.3 mg/dL   Total Protein 7.9 6.5 - 8.1 g/dL   Albumin 3.3 (L) 3.5 - 5.0 g/dL   AST 11 (L) 15 - 41 U/L   ALT 11 (L) 14 - 54 U/L   Alkaline Phosphatase 75 38 - 126 U/L   Total Bilirubin 0.5 0.3 - 1.2 mg/dL   GFR calc non Af Amer >60 >60 mL/min   GFR calc Af Amer >60 >60 mL/min    Comment: (NOTE) The eGFR has been calculated using the CKD EPI equation. This calculation has not been validated in all clinical situations. eGFR's persistently <60 mL/min signify possible Chronic Kidney Disease.    Anion  gap 9 5 - 15       A:  1. Adnexal mass   2. Abdominal pain during pregnancy in first trimester     P:  Discharge home in stable condition Return to the [facility] on Thursday @ 1100 for repeat quant day #4 Strict return precautions Pelvic rest Avoid NSAIDS, stop prenatal vitamin O positive blood type.   Lezlie Lye, NP 07/19/2017 8:24 PM  OB/GYN Attending Pt was seen with Cynthia Rosales. Dx with ectopic pregnancy. No pain or bleeding. Multiple medical problems as noted. Tx options for ectopic pregnancy reviewed with pt. Pt is not an ideal surgical candidate, thus I feel medical therapy should be attempted. Pt was counseled on the potential failure of MTX d/t to high BHCG, possible need for second dose and still possibility of surgery. S/Sx of ectopic/ruptured ectopic ( pain/bleeding) reviewed with pt and if occurs or for any concerns return to MAU for further evaluation. Importance of follow up reviewed. Pt verbalized understanding and agreed to POC. Pt will be seen in [facility] on day # 4 for repeat BHCG or sooner to the MAU for any concerns.    Arlina Robes, MD OB/GYN Faculty

## 2017-07-19 NOTE — Discharge Instructions (Signed)
Methotrexate Treatment for an Ectopic Pregnancy, Care After °Refer to this sheet in the next few weeks. These instructions provide you with information on caring for yourself after your procedure. Your health care provider may also give you more specific instructions. Your treatment has been planned according to current medical practices, but problems sometimes occur. Call your health care provider if you have any problems or questions after your procedure. °What can I expect after the procedure? °You may have some abdominal cramping, vaginal bleeding, and fatigue in the first few days after taking methotrexate. Some other possible side effects of methotrexate include: °· Nausea. °· Vomiting. °· Diarrhea. °· Mouth sores. °· Swelling or irritation of the lining of your lungs (pneumonitis). °· Liver damage. °· Hair loss. ° °Follow these instructions at home: °After you have received the methotrexate medicine, you need to be careful of your activities and watch your condition for several weeks. It may take 1 week before your hormone levels return to normal. °Activity °· Do not have sexual intercourse until your health care provider says it is safe to do so. °· You may resume your usual diet. °· Limit strenuous activity. °· Do not drink alcohol. °General instructions °· Do not take aspirin, ibuprofen, or naproxen (nonsteroidal anti-inflammatory drugs [NSAIDs]). °· Do not take folic acid, prenatal vitamins, or other vitamins that contain folic acid. °· Avoid traveling too far away from your health care provider. °· Keep all follow-up visits as told by your health care provider. This is important. °Contact a health care provider if: °· You cannot control your nausea and vomiting. °· You cannot control your diarrhea. °· You have sores in your mouth and want treatment. °· You need pain medicine for your abdominal pain. °· You have a rash. °· You are having a reaction to the medicine. °Get help right away if: °· You have  increasing abdominal or pelvic pain. °· You notice increased bleeding. °· You feel light-headed, or you faint. °· You have shortness of breath. °· Your heart rate increases. °· You have a cough. °· You have chills. °· You have a fever. °This information is not intended to replace advice given to you by your health care provider. Make sure you discuss any questions you have with your health care provider. °Document Released: 11/05/2011 Document Revised: 04/23/2016 Document Reviewed: 09/04/2013 °Elsevier Interactive Patient Education © 2017 [employer] ° °

## 2017-07-19 NOTE — Progress Notes (Signed)
Initial visit with Cynthia Rosales to introduce spiritual care services and offer support upon her recent diagnosis of ectopic pregnancy.  She shared that she's had a d&c previously and that it was difficult, but that she went on to carry a child to term.  She just found out that she was pregnant on Friday and was excited as she figured this would be her last.  She is not sure what the next steps and so doesn't yet know how she feels about everything as she's just beginning to process the news.  She is aware of chaplain availability and how to reach [place] and we will continue to follow if she is admitted or requests further care.  Please page as further needs arise.  Donald Prose. Elyn Peers, M.Div. [facility] Chaplain Pager [phone] Office [phone]

## 2017-07-19 NOTE — MAU Note (Signed)
Sent up from clinic for methotrexate for ectopic preg.  Pt denies any pain,  No bleeding.

## 2017-07-19 NOTE — Progress Notes (Signed)
Patient here for stat bhcg today. Patient reports continued  sharp, cramping that is intermittent at a pain level of 10 which is the same as in MAU. Patient reports pain at umbilicus & lower pelvis. Reviewed patient's results/history with Dr Ilda Basset who recommends ultrasound today if possible. Scheduled for ultrasound now. Patient informed of results & taken upstairs to ultrasound. Patient will return to office for results.  Ultrasound shows 6 week ectopic pregnancy in right adnexa. Per Dr Ilda Basset, patient should go to MAU immediately. Patient informed of results & taken to MAU. Report called to Dr Rip Harbour & MAU.

## 2017-07-20 ENCOUNTER — Inpatient Hospital Stay (HOSPITAL_COMMUNITY): Payer: Medicaid Other

## 2017-07-20 ENCOUNTER — Inpatient Hospital Stay (HOSPITAL_COMMUNITY): Payer: Medicaid Other | Admitting: Anesthesiology

## 2017-07-20 ENCOUNTER — Encounter (HOSPITAL_COMMUNITY)
Admission: AD | Disposition: A | Discharge status: Home / Self Care | Payer: Self-pay | Source: Ambulatory Visit | Attending: Family Medicine

## 2017-07-20 ENCOUNTER — Ambulatory Visit (HOSPITAL_COMMUNITY)
Admission: AD | Admit: 2017-07-20 | Discharge: 2017-07-21 | Disposition: A | Discharge status: Home / Self Care | Payer: Medicaid Other | Source: Ambulatory Visit | Attending: Family Medicine | Admitting: Family Medicine

## 2017-07-20 ENCOUNTER — Encounter (HOSPITAL_COMMUNITY): Payer: Self-pay

## 2017-07-20 DIAGNOSIS — Z79899 Other long term (current) drug therapy: Secondary | ICD-10-CM | POA: Insufficient documentation

## 2017-07-20 DIAGNOSIS — F329 Major depressive disorder, single episode, unspecified: Secondary | ICD-10-CM | POA: Diagnosis not present

## 2017-07-20 DIAGNOSIS — I10 Essential (primary) hypertension: Secondary | ICD-10-CM | POA: Diagnosis not present

## 2017-07-20 DIAGNOSIS — E119 Type 2 diabetes mellitus without complications: Secondary | ICD-10-CM | POA: Insufficient documentation

## 2017-07-20 DIAGNOSIS — O008 Other ectopic pregnancy without intrauterine pregnancy: Secondary | ICD-10-CM

## 2017-07-20 DIAGNOSIS — Z794 Long term (current) use of insulin: Secondary | ICD-10-CM | POA: Diagnosis not present

## 2017-07-20 DIAGNOSIS — J45909 Unspecified asthma, uncomplicated: Secondary | ICD-10-CM | POA: Insufficient documentation

## 2017-07-20 DIAGNOSIS — O26899 Other specified pregnancy related conditions, unspecified trimester: Secondary | ICD-10-CM

## 2017-07-20 DIAGNOSIS — Z87891 Personal history of nicotine dependence: Secondary | ICD-10-CM | POA: Insufficient documentation

## 2017-07-20 DIAGNOSIS — R109 Unspecified abdominal pain: Secondary | ICD-10-CM

## 2017-07-20 HISTORY — PX: LAPAROSCOPY: SHX197

## 2017-07-20 LAB — CBC
HCT: 28 % — ABNORMAL LOW (ref 36.0–46.0)
Hemoglobin: 9 g/dL — ABNORMAL LOW (ref 12.0–15.0)
MCH: 20.4 pg — ABNORMAL LOW (ref 26.0–34.0)
MCHC: 32.1 g/dL (ref 30.0–36.0)
MCV: 63.3 fL — ABNORMAL LOW (ref 78.0–100.0)
Platelets: 439 10*3/uL — ABNORMAL HIGH (ref 150–400)
RBC: 4.42 MIL/uL (ref 3.87–5.11)
RDW: 19.2 % — ABNORMAL HIGH (ref 11.5–15.5)
WBC: 10.9 10*3/uL — ABNORMAL HIGH (ref 4.0–10.5)

## 2017-07-20 LAB — HCG, QUANTITATIVE, PREGNANCY: hCG, Beta Chain, Quant, S: 29545 m[IU]/mL — ABNORMAL HIGH (ref <5)

## 2017-07-20 SURGERY — LAPAROSCOPY OPERATIVE
Anesthesia: General | Site: Abdomen

## 2017-07-20 MED ORDER — ONDANSETRON HCL 4 MG/2ML IJ SOLN
INTRAMUSCULAR | Status: AC
  Filled 2017-07-20: qty 2

## 2017-07-20 MED ORDER — ROCURONIUM BROMIDE 100 MG/10ML IV SOLN
INTRAVENOUS | Status: DC | PRN
  Administered 2017-07-20: 60 mg via INTRAVENOUS

## 2017-07-20 MED ORDER — SODIUM CHLORIDE 0.9 % IV SOLN: INTRAVENOUS | Status: DC

## 2017-07-20 MED ORDER — PROPOFOL 10 MG/ML IV BOLUS
INTRAVENOUS | Status: AC
  Filled 2017-07-20: qty 20

## 2017-07-20 MED ORDER — LACTATED RINGERS IV SOLN
INTRAVENOUS | Status: DC
  Administered 2017-07-20 (×2): via INTRAVENOUS

## 2017-07-20 MED ORDER — HYDROMORPHONE HCL 1 MG/ML IJ SOLN
1.0000 mg | Freq: Once | INTRAMUSCULAR | Status: AC
  Administered 2017-07-20: 1 mg via INTRAVENOUS

## 2017-07-20 MED ORDER — DEXAMETHASONE SODIUM PHOSPHATE 10 MG/ML IJ SOLN
INTRAMUSCULAR | Status: DC | PRN
  Administered 2017-07-20: 10 mg via INTRAVENOUS

## 2017-07-20 MED ORDER — LIDOCAINE HCL (CARDIAC) 20 MG/ML IV SOLN
INTRAVENOUS | Status: DC | PRN
  Administered 2017-07-20: 100 mg via INTRAVENOUS

## 2017-07-20 MED ORDER — ONDANSETRON HCL 4 MG/2ML IJ SOLN
INTRAMUSCULAR | Status: DC | PRN
  Administered 2017-07-20: 4 mg via INTRAVENOUS

## 2017-07-20 MED ORDER — MIDAZOLAM HCL 2 MG/2ML IJ SOLN
INTRAMUSCULAR | Status: AC
  Filled 2017-07-20: qty 2

## 2017-07-20 MED ORDER — NEOSTIGMINE METHYLSULFATE 10 MG/10ML IV SOLN
INTRAVENOUS | Status: AC
  Filled 2017-07-20: qty 1

## 2017-07-20 MED ORDER — MIDAZOLAM HCL 2 MG/2ML IJ SOLN
INTRAMUSCULAR | Status: DC | PRN
  Administered 2017-07-20: 2 mg via INTRAVENOUS

## 2017-07-20 MED ORDER — FENTANYL CITRATE (PF) 100 MCG/2ML IJ SOLN
INTRAMUSCULAR | Status: DC | PRN
  Administered 2017-07-20: 50 ug via INTRAVENOUS
  Administered 2017-07-20 – 2017-07-21 (×3): 100 ug via INTRAVENOUS

## 2017-07-20 MED ORDER — BUPIVACAINE HCL (PF) 0.25 % IJ SOLN
INTRAMUSCULAR | Status: DC | PRN
  Administered 2017-07-20: 15 mL

## 2017-07-20 MED ORDER — KETOROLAC TROMETHAMINE 30 MG/ML IJ SOLN
INTRAMUSCULAR | Status: AC
  Filled 2017-07-20: qty 1

## 2017-07-20 MED ORDER — FAMOTIDINE IN NACL 20-0.9 MG/50ML-% IV SOLN
20.0000 mg | Freq: Once | INTRAVENOUS | Status: AC
  Administered 2017-07-20: 20 mg via INTRAVENOUS
  Filled 2017-07-20: qty 50

## 2017-07-20 MED ORDER — FENTANYL CITRATE (PF) 100 MCG/2ML IJ SOLN
INTRAMUSCULAR | Status: AC
  Filled 2017-07-20: qty 2

## 2017-07-20 MED ORDER — SOD CITRATE-CITRIC ACID 500-334 MG/5ML PO SOLN
30.0000 mL | Freq: Once | ORAL | Status: AC
  Administered 2017-07-20: 30 mL via ORAL
  Filled 2017-07-20: qty 15

## 2017-07-20 MED ORDER — GLYCOPYRROLATE 0.2 MG/ML IJ SOLN
INTRAMUSCULAR | Status: AC
  Filled 2017-07-20: qty 3

## 2017-07-20 MED ORDER — PROPOFOL 10 MG/ML IV BOLUS
INTRAVENOUS | Status: DC | PRN
  Administered 2017-07-20: 280 mg via INTRAVENOUS

## 2017-07-20 MED ORDER — SUCCINYLCHOLINE CHLORIDE 200 MG/10ML IV SOSY
PREFILLED_SYRINGE | INTRAVENOUS | Status: AC
  Filled 2017-07-20: qty 10

## 2017-07-20 MED ORDER — HYDROMORPHONE HCL 1 MG/ML IJ SOLN
1.0000 mg | Freq: Once | INTRAMUSCULAR | Status: DC
  Filled 2017-07-20: qty 1

## 2017-07-20 MED ORDER — LIDOCAINE HCL (CARDIAC) 20 MG/ML IV SOLN
INTRAVENOUS | Status: AC
  Filled 2017-07-20: qty 5

## 2017-07-20 MED ORDER — FENTANYL CITRATE (PF) 250 MCG/5ML IJ SOLN
INTRAMUSCULAR | Status: AC
  Filled 2017-07-20: qty 5

## 2017-07-20 MED ORDER — SUCCINYLCHOLINE CHLORIDE 20 MG/ML IJ SOLN
INTRAMUSCULAR | Status: DC | PRN
  Administered 2017-07-20: 160 mg via INTRAVENOUS

## 2017-07-20 MED ORDER — DEXAMETHASONE SODIUM PHOSPHATE 4 MG/ML IJ SOLN
INTRAMUSCULAR | Status: AC
  Filled 2017-07-20: qty 1

## 2017-07-20 MED ORDER — HYDROMORPHONE HCL 1 MG/ML IJ SOLN
1.0000 mg | Freq: Once | INTRAMUSCULAR | Status: AC
  Administered 2017-07-20: 1 mg via INTRAVENOUS
  Filled 2017-07-20: qty 1

## 2017-07-20 MED ORDER — ROCURONIUM BROMIDE 100 MG/10ML IV SOLN
INTRAVENOUS | Status: AC
  Filled 2017-07-20: qty 1

## 2017-07-20 MED ORDER — SODIUM CHLORIDE 0.9 % IR SOLN
Status: DC | PRN
  Administered 2017-07-20: 3000 mL

## 2017-07-20 SURGICAL SUPPLY — 29 items
APPLICATOR ARISTA FLEXITIP XL (MISCELLANEOUS) ×2 IMPLANT
BLADE SURG 15 STRL LF C SS BP (BLADE) IMPLANT
BLADE SURG 15 STRL SS (BLADE)
CLOTH BEACON ORANGE TIMEOUT ST (SAFETY) ×2 IMPLANT
DRSG OPSITE POSTOP 3X4 (GAUZE/BANDAGES/DRESSINGS) IMPLANT
DURAPREP 26ML APPLICATOR (WOUND CARE) ×4 IMPLANT
GLOVE BIOGEL PI IND STRL 7.0 (GLOVE) ×4 IMPLANT
GLOVE BIOGEL PI INDICATOR 7.0 (GLOVE) ×4
GLOVE ECLIPSE 7.0 STRL STRAW (GLOVE) ×2 IMPLANT
GOWN STRL REUS W/TWL LRG LVL3 (GOWN DISPOSABLE) ×6 IMPLANT
HEMOSTAT ARISTA ABSORB 3G PWDR (MISCELLANEOUS) ×4 IMPLANT
HEMOSTAT SURGICEL 2X14 (HEMOSTASIS) ×2 IMPLANT
PACK LAPAROSCOPY BASIN (CUSTOM PROCEDURE TRAY) ×2 IMPLANT
PACK TRENDGUARD 450 HYBRID PRO (MISCELLANEOUS) IMPLANT
PACK TRENDGUARD 600 HYBRD PROC (MISCELLANEOUS) ×1 IMPLANT
POUCH SPECIMEN RETRIEVAL 10MM (ENDOMECHANICALS) ×2 IMPLANT
PROTECTOR NERVE ULNAR (MISCELLANEOUS) ×4 IMPLANT
SET IRRIG TUBING LAPAROSCOPIC (IRRIGATION / IRRIGATOR) ×2 IMPLANT
SHEARS HARMONIC ACE PLUS 36CM (ENDOMECHANICALS) ×2 IMPLANT
SLEEVE XCEL OPT CAN 5 100 (ENDOMECHANICALS) ×2 IMPLANT
SUT VIC AB 3-0 X1 27 (SUTURE) ×2 IMPLANT
SUT VICRYL 0 UR6 27IN ABS (SUTURE) ×4 IMPLANT
TOWEL OR 17X24 6PK STRL BLUE (TOWEL DISPOSABLE) ×4 IMPLANT
TRAY FOLEY CATH SILVER 14FR (SET/KITS/TRAYS/PACK) ×2 IMPLANT
TRENDGUARD 450 HYBRID PRO PACK (MISCELLANEOUS)
TRENDGUARD 600 HYBRID PROC PK (MISCELLANEOUS) ×2
TROCAR BALLN 12MMX100 BLUNT (TROCAR) ×2 IMPLANT
TROCAR XCEL NON-BLD 5MMX100MML (ENDOMECHANICALS) ×2 IMPLANT
WARMER LAPAROSCOPE (MISCELLANEOUS) ×2 IMPLANT

## 2017-07-20 NOTE — Anesthesia Preprocedure Evaluation (Addendum)
Anesthesia Evaluation  Patient identified by MRN, date of birth, ID band Patient awake    Reviewed: Allergy & Precautions, NPO status , Patient's Chart, lab work & pertinent test results  Airway Mallampati: I       Dental   Pulmonary asthma (controlled) , former smoker,    Pulmonary exam normal        Cardiovascular hypertension, Pt. on medications and Pt. on home beta blockers Normal cardiovascular exam  ECG: ST, rate 110   Neuro/Psych  Headaches, Depression    GI/Hepatic negative GI ROS, Neg liver ROS,   Endo/Other  diabetes, Poorly Controlled, Type 2, Insulin Dependent, Oral Hypoglycemic AgentsMorbid obesity  Renal/GU negative Renal ROS     Musculoskeletal   Abdominal (+) + obese,   Peds  Hematology  (+) anemia ,   Anesthesia Other Findings   Reproductive/Obstetrics                            Anesthesia Physical  Anesthesia Plan  ASA: III  Anesthesia Plan: General   Post-op Pain Management:    Induction: Intravenous  PONV Risk Score and Plan: 3 and Ondansetron, Dexamethasone, Midazolam and Treatment may vary due to age or medical condition  Airway Management Planned: Oral ETT and Video Laryngoscope Planned  Additional Equipment:   Intra-op Plan:   Post-operative Plan: Extubation in OR  Informed Consent: I have reviewed the patients History and Physical, chart, labs and discussed the procedure including the risks, benefits and alternatives for the proposed anesthesia with the patient or authorized representative who has indicated his/her understanding and acceptance.   Dental advisory given  Plan Discussed with: CRNA  Anesthesia Plan Comments:         Anesthesia Quick Evaluation

## 2017-07-20 NOTE — Progress Notes (Signed)
Notified anesthesia and house coverage about add on surgery.

## 2017-07-20 NOTE — Anesthesia Procedure Notes (Signed)
Procedure Name: Intubation Date/Time: 07/20/2017 10:22 PM Performed by: Hewitt Blade Pre-anesthesia Checklist: Patient identified, Emergency Drugs available, Suction available and Patient being monitored Patient Re-evaluated:Patient Re-evaluated prior to induction Oxygen Delivery Method: Circle system utilized Preoxygenation: Pre-oxygenation with 100% oxygen Induction Type: IV induction, Rapid sequence and Cricoid Pressure applied Laryngoscope Size: Glidescope Grade View: Grade I Tube type: Oral Tube size: 7.0 mm Number of attempts: 1 Airway Equipment and Method: Rigid stylet Placement Confirmation: ETT inserted through vocal cords under direct vision,  positive ETCO2 and breath sounds checked- equal and bilateral Secured at: 22 cm Tube secured with: Tape Dental Injury: Teeth and Oropharynx as per pre-operative assessment

## 2017-07-20 NOTE — Op Note (Signed)
PREOPERATIVE DIAGNOSIS: Probable ruptured ectopic pregnancy  POSTOPERATIVE DIAGNOSIS: right cornual ectopic  PROCEDURE: Laparoscopic right cornual wedge resection and partial  right salpingectomy   INDICATIONS: 36 y.o. W2O3785 at [redacted]w[redacted]d here for with ruptured ectopic pregnancy with blood type O POS. She was s/p MTX with acute abdomen and high BHCG. She was morbidly obese and had h/o ruptured appendix and C-section x 3. Patient was counseled regarding need for laparoscopic salpingectomy. Risks of surgery including bleeding which may require transfusion or reoperation, infection, injury to bowel or other surrounding organs, need for additional procedures including laparotomy and other postoperative/anesthesia complications were explained to patient.  Written informed consent was obtained  FINDINGS: Dilated right cornu and normal right fallopian tube  Small normal appearing uterus, normal. Significant adhesions of omentum to the anterior abdominal wall and of the uterus to the anterior abdominal wall. The left fallopian tube and ovary could not be seen.   ANESTHESIA: General - Ellender, Karyl Kinnier, MD   SPECIMENS: right cornua and partial fallopian tube to pathology  COMPLICATIONS: None immediate  ESTIMATED BLOOD LOSS: 400 CC  PROCEDURE IN DETAIL:  The patient was taken to the operating room where general anesthesia was administered and was found to be adequate.  She was placed in the dorsal lithotomy position, and was prepped and draped in a sterile manner.  A Foley catheter was inserted into her bladder and attached to constant drainage and a uterine manipulator was then advanced into the uterus .  After an adequate timeout was performed, attention was then turned to the patient's abdomen where a 10-mm skin incision was made in the umbilical fold. Fascia and peritoneum were entered sharply.  A 0 Vicryl suture was used to tag the fascia circumferentially.  A Hassan trocar was placed. The laparoscope was  introduced.  A survey of the patient's pelvis and abdomen revealed the findings as above.  Two right lower quadrant ports were placed under direct visualization; 5-mm x 2.  Attention was then turned to the right fallopian tube which was found to be normal. The uterus was grasped and  The enlarged cornua was ligated from the underlying uterus and mesosalpinx and uterine attachment using the Harmonic instrument.  Good hemostasis was noted.  The specimen was placed in an EndoCatch bag and removed from the abdomen intact. Clot and blood removed with the Nezjhat. The uterine defect was cauterized extensively with the Harmonic device. Arista placed x 2. The abdomen was desufflated and more bleeding noted from excision site. Surgicell placed over the defect x 2. All instruments were removed.  The umbilical incision was closed with the afore mentioned Vicryl suture; and all skin incisions were closed with a 3-0 Vicryl subcuticular stitch followed by DermaBond. The patient tolerated the procedure well.  All instruments, needles, and sponge counts were correct x 2. The patient was taken to the recovery room in stable condition.   [name] MD 07/20/2017 11:53 PM

## 2017-07-20 NOTE — H&P (Signed)
History     CSN: [id number]  Arrival date and time: 07/20/17 1931   None     No chief complaint on file.  HPI Patient is a 36 yo G5P2103 at 44w3dby [place] She was diagnosed yesterday with a ectopic pregnancy in the right fallopian tube measuring 2.2 x 2.2 x 2.4 containing yolk sac and a possible embryo with no cardiac activity visualized. There is no free fluid in the pelvis. Patient was given a injection of methotrexate and sent home. Pain started last night, but she began to have worsening right pelvic pain this morning, extremely worse around 1 PM. No palliating or provoking features. She denies fevers, chills, nausea, vomiting. She states that she has a feeling like she needs to push.  OB History    Gravida Para Term Preterm AB Living   _0 SAB TAB Ectopic Multiple Live Births           3      Past Medical History:  Diagnosis Date  . Abscess of bursa, left elbow 06/2013   Treated with I and D/antibiotics.   . Anemia   . Asthma   . Depression   . History of blood transfusion    "after I had one of my kids"  . Hypertension   . Ovarian cyst   . Renal disorder    kidney stones  . Sickle cell disease ([place]   . Type II diabetes mellitus ([facility]     Past Surgical History:  Procedure Laterality Date  . APPENDECTOMY  2013  . CARPAL TUNNEL RELEASE    . CESAREAN SECTION  2000; 2007; 2011  . DILATION AND CURETTAGE OF UTERUS    . LAMINECTOMY N/A 12/13/2014   Procedure: CERVICAL LAMINECTOMY FOR INTRADURAL TUMOR;  Surgeon: HCharlie Pitter MD;  [facility] [facility];  Service: Neurosurgery;  Laterality: N/A;  posterior  . REDUCTION MAMMAPLASTY Bilateral 1998    Family History  Problem Relation Age of Onset  . Diabetes Mother   . Hypertension Mother   . Breast cancer Maternal Aunt   . Ovarian cancer Maternal Grandmother   . Breast cancer Maternal Aunt   . Migraines Neg Hx     Social History  Substance Use Topics  . Smoking status: Former Smoker    Years: 0.00   . Smokeless tobacco: Never Used     Comment: 03/27/2014 "smoked ~ 1 cigarette/day; quit in ~ 2013"  . Alcohol use 0.0 oz/week    Allergies:  Allergies  Allergen Reactions  . Buprenorphine Hcl Itching and Nausea Only  . Morphine And Related Itching and Nausea Only  . Amoxicillin Hives and Rash    Has patient had a PCN reaction causing immediate rash, facial/tongue/throat swelling, SOB or lightheadedness with hypotension: Yes Has patient had a PCN reaction causing severe rash involving mucus membranes or skin necrosis: No Has patient had a PCN reaction that required hospitalization: No  Has patient had a PCN reaction occurring within the last 10 years: Yes If all of the above answers are "NO", then may proceed with Cephalosporin use.      Prescriptions Prior to Admission  Medication Sig Dispense Refill Last Dose  . ACCU-CHEK FASTCLIX LANCETS MISC CHECK BLOOD SUGAR 2-3 TIMES DAILY  0 Taking  . ACCU-CHEK SMARTVIEW test strip CHECK BLOOD SUGAR 2-3 TIMES DAILY  0 Taking  . albuterol (VENTOLIN HFA) 108 (90 Base) MCG/ACT inhaler Inhale 2 puffs into the lungs every  6 (six) hours as needed for wheezing or shortness of breath.   Past Month at Unknown time  . Blood Glucose Monitoring Suppl (ACCU-CHEK NANO SMARTVIEW) w/Device KIT USE TO CHECK BLOOD SUGAR 2-3 TIMES DAILY  0 Taking  . hydrochlorothiazide (HYDRODIURIL) 25 MG tablet Take 25 mg by mouth daily.  3 07/15/2017 at Unknown time  . insulin NPH-regular Human (NOVOLIN 70/30) (70-30) 100 UNIT/ML injection Inject 35 Units into the skin 3 (three) times daily before meals.   07/16/2017 at Unknown time  . metoCLOPramide (REGLAN) 10 MG tablet Take 1 tablet (10 mg total) by mouth every 6 (six) hours as needed for nausea or vomiting. 30 tablet 0   . SitaGLIPtin-MetFORMIN HCl (JANUMET XR) [phone] MG TB24 Take 1 tablet by mouth daily.   Past Week at Unknown time    Review of Systems  All other systems reviewed and are negative.  Physical Exam    Blood pressure 135/80, pulse 98, temperature 99.2 F (37.3 C), temperature source Oral, last menstrual period 05/15/2017, not currently breastfeeding.  Physical Exam  Constitutional: She is oriented to person, place, and time. She appears well-developed and well-nourished.  HENT:  Head: Normocephalic and atraumatic.  Right Ear: External ear normal.  Left Ear: External ear normal.  Eyes: Pupils are equal, round, and reactive to light. Conjunctivae are normal.  Neck: Normal range of motion. Neck supple.  Cardiovascular: Normal rate, regular rhythm and normal heart sounds.   Respiratory: Effort normal and breath sounds normal. No respiratory distress. She has no wheezes. She has no rales.  GI: Soft. She exhibits no distension. There is tenderness (RLQ, periumbilical TTP). There is no rebound and no guarding.  Neurological: She is alert and oriented to person, place, and time.  Skin: Skin is warm and dry.  Psychiatric: She has a normal mood and affect. Her behavior is normal. Judgment and thought content normal.   CBC    Component Value Date/Time   WBC 10.9 (H) 07/20/2017 1937   RBC 4.42 07/20/2017 1937   HGB 9.0 (L) 07/20/2017 1937   HCT 28.0 (L) 07/20/2017 1937   PLT 439 (H) 07/20/2017 1937   MCV 63.3 (L) 07/20/2017 1937   MCH 20.4 (L) 07/20/2017 1937   MCHC 32.1 07/20/2017 1937   RDW 19.2 (H) 07/20/2017 1937   LYMPHSABS 3.7 07/19/2017 1322   MONOABS 0.2 07/19/2017 1322   EOSABS 0.1 07/19/2017 1322   BASOSABS 0.0 07/19/2017 1322      MAU Course  Procedures   MDM 1937: Initial labs drawn: CBC, hCG. We'll hold tube for type and screen. IV fluids started and patient given 1 mg of Dilaudid IV. Pelvic ultrasound ordered.   Assessment and Plan  1. Right ectopic pregnancy 2. DM2 3. H/o cesarean section 4. HTN  With her HCG >20000 and with the amount of pain she is having, she is at high risk of rupture. I discussed the patient with Dr Kennon Rounds, who is agreed that surgery  for the ectopic pregnancy is indicated.   I discussed the patient that with her previous surgeries that she is at high risk of complications: injury to intestines, to the bladder. She could have a lot of adhesions, making it necessary to convert from laparoscopic to laparotomy. She is also at risk for wound infection, blood clots, poor wound healing. Pt NPO since 1pm. Surgical team notified. Pt to remain NPO for procedure.   Truett Mainland 07/20/2017, 7:56 PM

## 2017-07-20 NOTE — MAU Provider Note (Signed)
 History     CSN: [id number]  Arrival date and time: 07/20/17 1931   None     No chief complaint on file.  HPI Patient is a 36 yo G5P2103 at [redacted]w[redacted]d by US. She was diagnosed yesterday with a ectopic pregnancy in the right fallopian tube measuring 2.2 x 2.2 x 2.4 containing yolk sac and a possible embryo with no cardiac activity visualized. There is no free fluid in the pelvis. Patient was given a injection of methotrexate and sent home. Pain started last night, but she began to have worsening right pelvic pain this morning, extremely worse around 1 PM. No palliating or provoking features. She denies fevers, chills, nausea, vomiting. She states that she has a feeling like she needs to push.  OB History    Gravida Para Term Preterm AB Living   5 3 2 1   3   SAB TAB Ectopic Multiple Live Births           3      Past Medical History:  Diagnosis Date  . Abscess of bursa, left elbow 06/2013   Treated with I and D/antibiotics.   . Anemia   . Asthma   . Depression   . History of blood transfusion    "after I had one of my kids"  . Hypertension   . Ovarian cyst   . Renal disorder    kidney stones  . Sickle cell disease (HCC)   . Type II diabetes mellitus (HCC)     Past Surgical History:  Procedure Laterality Date  . APPENDECTOMY  2013  . CARPAL TUNNEL RELEASE    . CESAREAN SECTION  2000; 2007; 2011  . DILATION AND CURETTAGE OF UTERUS    . LAMINECTOMY N/A 12/13/2014   Procedure: CERVICAL LAMINECTOMY FOR INTRADURAL TUMOR;  Surgeon: Henry A Pool, MD;  Location: [facility];  Service: Neurosurgery;  Laterality: N/A;  posterior  . REDUCTION MAMMAPLASTY Bilateral 1998    Family History  Problem Relation Age of Onset  . Diabetes Mother   . Hypertension Mother   . Breast cancer Maternal Aunt   . Ovarian cancer Maternal Grandmother   . Breast cancer Maternal Aunt   . Migraines Neg Hx     Social History  Substance Use Topics  . Smoking status: Former Smoker    Years: 0.00   . Smokeless tobacco: Never Used     Comment: 03/27/2014 "smoked ~ 1 cigarette/day; quit in ~ 2013"  . Alcohol use 0.0 oz/week    Allergies:  Allergies  Allergen Reactions  . Buprenorphine Hcl Itching and Nausea Only  . Morphine And Related Itching and Nausea Only  . Amoxicillin Hives and Rash    Has patient had a PCN reaction causing immediate rash, facial/tongue/throat swelling, SOB or lightheadedness with hypotension: Yes Has patient had a PCN reaction causing severe rash involving mucus membranes or skin necrosis: No Has patient had a PCN reaction that required hospitalization: No  Has patient had a PCN reaction occurring within the last 10 years: Yes If all of the above answers are "NO", then may proceed with Cephalosporin use.      Prescriptions Prior to Admission  Medication Sig Dispense Refill Last Dose  . ACCU-CHEK FASTCLIX LANCETS MISC CHECK BLOOD SUGAR 2-3 TIMES DAILY  0 Taking  . ACCU-CHEK SMARTVIEW test strip CHECK BLOOD SUGAR 2-3 TIMES DAILY  0 Taking  . albuterol (VENTOLIN HFA) 108 (90 Base) MCG/ACT inhaler Inhale 2 puffs into the lungs every   6 (six) hours as needed for wheezing or shortness of breath.   Past Month at Unknown time  . Blood Glucose Monitoring Suppl (ACCU-CHEK NANO SMARTVIEW) w/Device KIT USE TO CHECK BLOOD SUGAR 2-3 TIMES DAILY  0 Taking  . hydrochlorothiazide (HYDRODIURIL) 25 MG tablet Take 25 mg by mouth daily.  3 07/15/2017 at Unknown time  . insulin NPH-regular Human (NOVOLIN 70/30) (70-30) 100 UNIT/ML injection Inject 35 Units into the skin 3 (three) times daily before meals.   07/16/2017 at Unknown time  . metoCLOPramide (REGLAN) 10 MG tablet Take 1 tablet (10 mg total) by mouth every 6 (six) hours as needed for nausea or vomiting. 30 tablet 0   . SitaGLIPtin-MetFORMIN HCl (JANUMET XR) 100-1000 MG TB24 Take 1 tablet by mouth daily.   Past Week at Unknown time    Review of Systems  All other systems reviewed and are negative.  Physical Exam    Blood pressure 135/80, pulse 98, temperature 99.2 F (37.3 C), temperature source Oral, last menstrual period 05/15/2017, not currently breastfeeding.  Physical Exam  Constitutional: She is oriented to person, place, and time. She appears well-developed and well-nourished.  HENT:  Head: Normocephalic and atraumatic.  Right Ear: External ear normal.  Left Ear: External ear normal.  Eyes: Pupils are equal, round, and reactive to light. Conjunctivae are normal.  Neck: Normal range of motion. Neck supple.  Cardiovascular: Normal rate, regular rhythm and normal heart sounds.   Respiratory: Effort normal and breath sounds normal. No respiratory distress. She has no wheezes. She has no rales.  GI: Soft. She exhibits no distension. There is tenderness (RLQ, periumbilical TTP). There is no rebound and no guarding.  Neurological: She is alert and oriented to person, place, and time.  Skin: Skin is warm and dry.  Psychiatric: She has a normal mood and affect. Her behavior is normal. Judgment and thought content normal.   CBC    Component Value Date/Time   WBC 10.9 (H) 07/20/2017 1937   RBC 4.42 07/20/2017 1937   HGB 9.0 (L) 07/20/2017 1937   HCT 28.0 (L) 07/20/2017 1937   PLT 439 (H) 07/20/2017 1937   MCV 63.3 (L) 07/20/2017 1937   MCH 20.4 (L) 07/20/2017 1937   MCHC 32.1 07/20/2017 1937   RDW 19.2 (H) 07/20/2017 1937   LYMPHSABS 3.7 07/19/2017 1322   MONOABS 0.2 07/19/2017 1322   EOSABS 0.1 07/19/2017 1322   BASOSABS 0.0 07/19/2017 1322      MAU Course  Procedures   MDM 1937: Initial labs drawn: CBC, hCG. We'll hold tube for type and screen. IV fluids started and patient given 1 mg of Dilaudid IV. Pelvic ultrasound ordered.   Assessment and Plan  1. Right ectopic pregnancy 2. DM2 3. H/o cesarean section 4. HTN  With her HCG >20000 and with the amount of pain she is having, she is at high risk of rupture. I discussed the patient with Dr Pratt, who is agreed that surgery  for the ectopic pregnancy is indicated.   I discussed the patient that with her previous surgeries that she is at high risk of complications: injury to intestines, to the bladder. She could have a lot of adhesions, making it necessary to convert from laparoscopic to laparotomy. She is also at risk for wound infection, blood clots, poor wound healing. Pt NPO since 1pm. Surgical team notified. Pt to remain NPO for procedure.   Zehava Turski J Ani Deoliveira 07/20/2017, 7:56 PM   

## 2017-07-20 NOTE — Interval H&P Note (Signed)
History and Physical Interval Note:  07/20/2017 9:46 PM  Cynthia Rosales  has presented today for surgery, with the diagnosis of ectopic pregnancy  The various methods of treatment have been discussed with the patient and family. After consideration of risks, benefits and other options for treatment, the patient has consented to  Procedure(s): LAPAROSCOPY OPERATIVE (N/A) as a surgical intervention .  The patient's history has been reviewed, patient examined, no change in status, stable for surgery.  I have reviewed the patient's chart and labs. I have reviewed the risks of surgery, including possible need for laparotomy and injury to bowels based on prior surgeries.   Questions were answered to the patient's satisfaction.     [name]

## 2017-07-21 ENCOUNTER — Encounter (HOSPITAL_COMMUNITY): Payer: Self-pay | Admitting: Family Medicine

## 2017-07-21 DIAGNOSIS — O008 Other ectopic pregnancy without intrauterine pregnancy: Secondary | ICD-10-CM | POA: Diagnosis present

## 2017-07-21 LAB — GLUCOSE, CAPILLARY
Glucose-Capillary: 254 mg/dL — ABNORMAL HIGH (ref 65–99)
Glucose-Capillary: 189 mg/dL — ABNORMAL HIGH (ref 65–99)
Glucose-Capillary: 204 mg/dL — ABNORMAL HIGH (ref 65–99)

## 2017-07-21 LAB — CBC
HCT: 28.2 % — ABNORMAL LOW (ref 36.0–46.0)
Hemoglobin: 9.1 g/dL — ABNORMAL LOW (ref 12.0–15.0)
MCH: 20.6 pg — ABNORMAL LOW (ref 26.0–34.0)
MCHC: 32.3 g/dL (ref 30.0–36.0)
MCV: 63.9 fL — ABNORMAL LOW (ref 78.0–100.0)
Platelets: 387 10*3/uL (ref 150–400)
RBC: 4.41 MIL/uL (ref 3.87–5.11)
RDW: 19.3 % — ABNORMAL HIGH (ref 11.5–15.5)
WBC: 11.2 10*3/uL — ABNORMAL HIGH (ref 4.0–10.5)

## 2017-07-21 MED ORDER — KETOROLAC TROMETHAMINE 30 MG/ML IJ SOLN: 30.0000 mg | Freq: Once | INTRAMUSCULAR | Status: DC

## 2017-07-21 MED ORDER — METOCLOPRAMIDE HCL 10 MG PO TABS
10.0000 mg | ORAL_TABLET | Freq: Four times a day (QID) | ORAL | Status: DC | PRN
  Filled 2017-07-21: qty 1

## 2017-07-21 MED ORDER — PROMETHAZINE HCL 25 MG/ML IJ SOLN: 6.2500 mg | INTRAMUSCULAR | Status: DC | PRN

## 2017-07-21 MED ORDER — KETOROLAC TROMETHAMINE 30 MG/ML IJ SOLN
INTRAMUSCULAR | Status: AC
  Filled 2017-07-21: qty 1

## 2017-07-21 MED ORDER — SUGAMMADEX SODIUM 500 MG/5ML IV SOLN
INTRAVENOUS | Status: DC | PRN
  Administered 2017-07-20: 500 mg via INTRAVENOUS

## 2017-07-21 MED ORDER — HYDROCHLOROTHIAZIDE 25 MG PO TABS
25.0000 mg | ORAL_TABLET | Freq: Every day | ORAL | Status: DC
  Administered 2017-07-21: 25 mg via ORAL
  Filled 2017-07-21: qty 1

## 2017-07-21 MED ORDER — HYDROMORPHONE HCL 1 MG/ML IJ SOLN
0.2500 mg | INTRAMUSCULAR | Status: DC | PRN
  Administered 2017-07-21 (×4): 0.5 mg via INTRAVENOUS
  Filled 2017-07-21: qty 0.5

## 2017-07-21 MED ORDER — MENTHOL 3 MG MT LOZG: 1.0000 | LOZENGE | OROMUCOSAL | Status: DC | PRN

## 2017-07-21 MED ORDER — METFORMIN HCL ER 500 MG PO TB24
1000.0000 mg | ORAL_TABLET | Freq: Every day | ORAL | Status: DC
  Administered 2017-07-21: 1000 mg via ORAL
  Filled 2017-07-21 (×2): qty 2

## 2017-07-21 MED ORDER — METOCLOPRAMIDE HCL 5 MG/ML IJ SOLN
10.0000 mg | Freq: Four times a day (QID) | INTRAMUSCULAR | Status: DC
  Administered 2017-07-21: 10 mg via INTRAVENOUS
  Filled 2017-07-21: qty 2

## 2017-07-21 MED ORDER — INSULIN ASPART PROT & ASPART (70-30 MIX) 100 UNIT/ML ~~LOC~~ SUSP
35.0000 [IU] | Freq: Two times a day (BID) | SUBCUTANEOUS | Status: DC
  Administered 2017-07-21: 35 [IU] via SUBCUTANEOUS
  Filled 2017-07-21: qty 10

## 2017-07-21 MED ORDER — ONDANSETRON HCL 4 MG/2ML IJ SOLN: 4.0000 mg | INTRAMUSCULAR | Status: DC | PRN

## 2017-07-21 MED ORDER — OXYCODONE HCL 5 MG PO TABS
30.0000 mg | ORAL_TABLET | Freq: Four times a day (QID) | ORAL | Status: DC | PRN
  Administered 2017-07-21 (×2): 30 mg via ORAL
  Filled 2017-07-21 (×2): qty 6

## 2017-07-21 MED ORDER — KETOROLAC TROMETHAMINE 30 MG/ML IJ SOLN: 30.0000 mg | Freq: Four times a day (QID) | INTRAMUSCULAR | Status: DC

## 2017-07-21 MED ORDER — KETOROLAC TROMETHAMINE 30 MG/ML IJ SOLN
30.0000 mg | Freq: Four times a day (QID) | INTRAMUSCULAR | Status: DC
  Administered 2017-07-21 (×2): 30 mg via INTRAVENOUS
  Filled 2017-07-21: qty 1

## 2017-07-21 MED ORDER — HYDROMORPHONE HCL 1 MG/ML IJ SOLN
1.0000 mg | Freq: Once | INTRAMUSCULAR | Status: AC
  Administered 2017-07-21: 1 mg via INTRAVENOUS
  Filled 2017-07-21: qty 1

## 2017-07-21 MED ORDER — LINAGLIPTIN 5 MG PO TABS
5.0000 mg | ORAL_TABLET | Freq: Every day | ORAL | Status: DC
  Administered 2017-07-21: 5 mg via ORAL
  Filled 2017-07-21 (×2): qty 1

## 2017-07-21 MED ORDER — HYDROMORPHONE HCL 1 MG/ML IJ SOLN
INTRAMUSCULAR | Status: AC
  Administered 2017-07-21: 0.5 mg via INTRAVENOUS
  Filled 2017-07-21: qty 0.5

## 2017-07-21 MED ORDER — HYDROMORPHONE HCL 1 MG/ML IJ SOLN
INTRAMUSCULAR | Status: AC
  Administered 2017-07-21: 0.5 mg via INTRAVENOUS
  Filled 2017-07-21: qty 1

## 2017-07-21 NOTE — Transfer of Care (Signed)
Immediate Anesthesia Transfer of Care Note  Patient: Cynthia Rosales  Procedure(s) Performed: Procedure(s): LAPAROSCOPY OPERATIVE WITH WEDGE RESECTION RIGHT CORNUA AND PARTIAL SALPINGECTOMY (N/A)  Patient Location: PACU  Anesthesia Type:General  Level of Consciousness: awake, alert , oriented and patient cooperative  Airway & Oxygen Therapy: Patient Spontanous Breathing and Patient connected to nasal cannula oxygen  Post-op Assessment: Report given to RN and Post -op Vital signs reviewed and stable  Post vital signs: Reviewed and stable  Last Vitals:  Vitals:   07/20/17 1939  BP: 135/80  Pulse: 98  Temp: 37.3 C    Last Pain:  Vitals:   07/20/17 2107  TempSrc:   PainSc: 10-Worst pain ever         Complications: No apparent anesthesia complications

## 2017-07-21 NOTE — Discharge Instructions (Signed)
Laparoscopy, Care After Refer to this sheet in the next few weeks. These instructions provide you with information about caring for yourself after your procedure. Your health care provider may also give you more specific instructions. Your treatment has been planned according to current medical practices, but problems sometimes occur. Call your health care provider if you have any problems or questions after your procedure. What can I expect after the procedure? After your procedure, it is common to have mild discomfort in the throat and abdomen. Follow these instructions at home:  Take over-the-counter and prescription medicines only as told by your health care provider.  Do not drive for 24 hours if you received a sedative.  Return to your normal activities as told by your health care provider.  Do not take baths, swim, or use a hot tub until your health care provider approves. You may shower.  Follow instructions from your health care provider about how to take care of your incision. Make sure you: ? Wash your hands with soap and water before you change your bandage (dressing). If soap and water are not available, use hand sanitizer. ? Change your dressing as told by your health care provider. ? Leave stitches (sutures), skin glue, or adhesive strips in place. These skin closures may need to stay in place for 2 weeks or longer. If adhesive strip edges start to loosen and curl up, you may trim the loose edges. Do not remove adhesive strips completely unless your health care provider tells you to do that.  Check your incision area every day for signs of infection. Check for: ? More redness, swelling, or pain. ? More fluid or blood. ? Warmth. ? Pus or a bad smell.  It is your responsibility to get the results of your procedure. Ask your health care provider or the department performing the procedure when your results will be ready. Contact a health care provider if:  There is new pain in  your shoulders.  You feel light-headed or faint.  You are unable to pass gas or unable to have a bowel movement.  You feel nauseous or you vomit.  You develop a rash.  You have more redness, swelling, or pain around your incision.  You have more fluid or blood coming from your incision.  Your incision feels warm to the touch.  You have pus or a bad smell coming from your incision.  You have a fever or chills. Get help right away if:  Your pain is getting worse.  You have ongoing vomiting.  The edges of your incision open up.  You have trouble breathing.  You have chest pain. This information is not intended to replace advice given to you by your health care provider. Make sure you discuss any questions you have with your health care provider. Document Released: 10/28/2015 Document Revised: 04/23/2016 Document Reviewed: 07/30/2015 Elsevier Interactive Patient Education  2018 [employer].

## 2017-07-21 NOTE — Discharge Summary (Signed)
Physician Discharge Summary  Patient ID: Cynthia Rosales MRN: [id number] DOB/AGE: 1981/05/15 36 y.o.  Admit date: 07/20/2017 Discharge date:   Admission Diagnoses:  Active Problems:   Pregnancy, ectopic, cornual or cervical   Discharge Diagnoses:  Same  Past Medical History:  Diagnosis Date  . Abscess of bursa, left elbow 06/2013   Treated with I and D/antibiotics.   . Anemia   . Asthma   . Depression   . History of blood transfusion    "after I had one of my kids"  . Hypertension   . Ovarian cyst   . Renal disorder    kidney stones  . Sickle cell disease ([place])   . Type II diabetes mellitus ([place])     Surgeries: Procedure(s): LAPAROSCOPY OPERATIVE WITH WEDGE RESECTION RIGHT CORNUA AND PARTIAL SALPINGECTOMY on 07/20/2017 - 07/21/2017   Discharged Condition: Improved  Hospital Course: Cynthia Rosales is an 36 y.o. female [id number] who was admitted 07/20/2017 with a chief complaint of abdominal pain in setting of ectopic, and found to have a diagnosis of cornual ectopic  They were brought to the operating room on 07/20/2017 - 07/21/2017 and underwent the above named procedures.    They were given perioperative antibiotics:  Anti-infectives    None    .  They were given sequential compression devices, early ambulation, and chemoprophylaxis for DVT prophylaxis.  They benefited maximally from their hospital stay and there were no complications.    Recent vital signs:  Vitals:   07/21/17 0549 07/21/17 0749  BP: 116/66 137/70  Pulse: 93 (!) 114  Resp: 16 16  Temp: 98.7 F (37.1 C) 98.6 F (37 C)  SpO2: 98% 100%    Recent laboratory studies:  Results for orders placed or performed during the hospital encounter of 07/20/17  CBC  Result Value Ref Range   WBC 10.9 (H) 4.0 - 10.5 K/uL   RBC 4.42 3.87 - 5.11 MIL/uL   Hemoglobin 9.0 (L) 12.0 - 15.0 g/dL   HCT 28.0 (L) 36.0 - 46.0 %   MCV 63.3 (L) 78.0 - 100.0 fL   MCH 20.4 (L) 26.0 - 34.0 pg   MCHC 32.1 30.0 - 36.0 g/dL    RDW 19.2 (H) 11.5 - 15.5 %   Platelets 439 (H) 150 - 400 K/uL  hCG, quantitative, pregnancy  Result Value Ref Range   hCG, Beta Chain, Quant, S 29,545 (H) <5 mIU/mL  CBC  Result Value Ref Range   WBC 11.2 (H) 4.0 - 10.5 K/uL   RBC 4.41 3.87 - 5.11 MIL/uL   Hemoglobin 9.1 (L) 12.0 - 15.0 g/dL   HCT 28.2 (L) 36.0 - 46.0 %   MCV 63.9 (L) 78.0 - 100.0 fL   MCH 20.6 (L) 26.0 - 34.0 pg   MCHC 32.3 30.0 - 36.0 g/dL   RDW 19.3 (H) 11.5 - 15.5 %   Platelets 387 150 - 400 K/uL  Glucose, capillary  Result Value Ref Range   Glucose-Capillary 254 (H) 65 - 99 mg/dL    Discharge Medications:   Allergies as of 07/21/2017      Reactions   Buprenorphine Hcl Itching, Nausea Only   Morphine And Related Itching, Nausea Only   Amoxicillin Hives, Rash   Has patient had a PCN reaction causing immediate rash, facial/tongue/throat swelling, SOB or lightheadedness with hypotension: Yes Has patient had a PCN reaction causing severe rash involving mucus membranes or skin necrosis: No Has patient had a PCN reaction that required hospitalization: No  Has patient  had a PCN reaction occurring within the last 10 years: Yes If all of the above answers are "NO", then may proceed with Cephalosporin use.      Medication List    TAKE these medications   hydrochlorothiazide 25 MG tablet Commonly known as:  HYDRODIURIL Take 25 mg by mouth daily.   JANUMET XR [phone] MG Tb24 Generic drug:  SitaGLIPtin-MetFORMIN HCl Take 1 tablet by mouth daily.   metoCLOPramide 10 MG tablet Commonly known as:  REGLAN Take 1 tablet (10 mg total) by mouth every 6 (six) hours as needed for nausea or vomiting.   NOVOLIN 70/30 (70-30) 100 UNIT/ML injection Generic drug:  insulin NPH-regular Human Inject 35 Units into the skin 3 (three) times daily before meals.   oxycodone 30 MG immediate release tablet Commonly known as:  ROXICODONE Take 30 mg by mouth every 6 (six) hours as needed for pain.            Discharge  Care Instructions        Start     Ordered   07/21/17 0000  Diet - low sodium heart healthy     07/21/17 0950   07/21/17 0000  Increase activity slowly     07/21/17 0950   07/21/17 0000  Diet Carb Modified     07/21/17 0950   07/21/17 0000  Lifting restrictions    Comments:  Nothing > 20 lbs x 2 wks   07/21/17 0950   07/21/17 0000  Sexual Activity Restrictions    Comments:  None x 1 wk   07/21/17 0950   07/21/17 0000  Call MD for:  persistant nausea and vomiting     07/21/17 0950   07/21/17 0000  Call MD for:  temperature >100.4     07/21/17 0950   07/21/17 0000  Call MD for:  severe uncontrolled pain     07/21/17 0950   07/21/17 0000  Call MD for:  redness, tenderness, or signs of infection (pain, swelling, redness, odor or green/yellow discharge around incision site)     07/21/17 0950      Diagnostic Studies: Mr Cervical Spine W Wo Contrast  Result Date: 07/06/2017 CLINICAL DATA:  36 y/o F; numbness of the right-sided body beginning 2 days ago. Previous tumor removed in 2015. EXAM: MRI CERVICAL SPINE WITHOUT AND WITH CONTRAST TECHNIQUE: Multiplanar and multiecho pulse sequences of the cervical spine, to include the craniocervical junction and cervicothoracic junction, were obtained without and with intravenous contrast. CONTRAST:  64mL MULTIHANCE GADOBENATE DIMEGLUMINE 529 MG/ML IV SOLN COMPARISON:  03/10/2017 and 12/05/2014 cervical MRI FINDINGS: Alignment: Physiologic. Vertebrae: C3-C5 laminectomy. Cord: Prior tumor resection changes within the posterior cord at the C4-5 level are stable. Interval mild decrease in linear enhancement at the margins of the surgical cavity likely representing scarring/gliosis. No recurrent/residual mass identified. 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 No new cord signal abnormality or enhancement. Posterior Fossa, vertebral arteries,  paraspinal tissues: Negative. Disc levels: No significant disc displacement, foraminal stenosis, or canal stenosis. IMPRESSION: Stable postsurgical changes related to C3-5 laminectomy and tumor resection from the cervical cord at the C4-5 level. 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 No new cord signal or enhancement. No significant cervical degenerative changes. Electronically Signed   By: Kristine Garbe M.D.   On: 07/06/2017 14:20   Mr Thoracic Spine W  Wo Contrast  Result Date: 06/30/2017 CLINICAL DATA:  History of cervical ependymoma removal. Ongoing upper and lower spine pain. BILATERAL leg weakness. EXAM: MRI THORACIC AND LUMBAR SPINE WITHOUT AND WITH CONTRAST TECHNIQUE: Multiplanar and multiecho pulse sequences of the thoracic and lumbar spine were obtained without and with intravenous contrast. CONTRAST:  61mL MULTIHANCE GADOBENATE DIMEGLUMINE 529 MG/ML IV SOLN COMPARISON:  MRI lumbar spine 09/03/2015. CT thoracic spine 05/19/2016. FINDINGS: MRI THORACIC SPINE FINDINGS Alignment:  Anatomic Vertebrae: No fracture, evidence of discitis, or bone lesion. Cord:  Normal signal and morphology. 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 No osseous erosion on previous CT. Suspected thyromegaly, incompletely evaluated on this thoracic spine exam. Consider sonography for further evaluation. Disc levels: Central protrusion at T7-8 effaces the anterior subarachnoid space, but does not result in significant stenosis or cord flattening. MRI LUMBAR SPINE FINDINGS Segmentation:  Standard. Alignment:  Physiologic. Vertebrae:  No fracture, evidence of discitis, or bone lesion. Conus medullaris: Extends to the L1 level and appears normal. Paraspinal and other soft tissues: Uterine enlargement, possible fibroids.  Increased body habitus. Disc levels: Disc desiccation at L5-S1. Small central protrusion. This is noncompressive. Lower lumbar facet arthropathy is also noted. Prior lumbar MRI, similar appearance. IMPRESSION: No areas of significant cord compression in the thoracic spine to correlate with myelopathy. No abnormal enhancement in the thoracic region. Shallow central protrusion at T7-8, noncompressive. Shallow central protrusion at L5-S1, noncompressive. No abnormal enhancement in the lumbar region. No evidence for drop metastases from the patient's previous surgically removed ependymoma. Suspected thyromegaly, incompletely evaluated. Uterine enlargement, possible fibroids. 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 Electronically Signed   By: Staci Righter M.D.   On: 06/30/2017 11:32   Mr Lumbar Spine W Wo Contrast  Result Date: 06/30/2017 CLINICAL DATA:  History of cervical ependymoma removal. Ongoing upper and lower spine pain. BILATERAL leg weakness. EXAM: MRI THORACIC AND LUMBAR SPINE WITHOUT AND WITH CONTRAST TECHNIQUE: Multiplanar and multiecho pulse sequences of the thoracic and lumbar spine were obtained without and with intravenous contrast. CONTRAST:  40mL MULTIHANCE GADOBENATE DIMEGLUMINE 529 MG/ML IV SOLN COMPARISON:  MRI lumbar spine 09/03/2015. CT thoracic spine 05/19/2016. FINDINGS: MRI THORACIC SPINE FINDINGS Alignment:  Anatomic Vertebrae: No fracture, evidence of discitis, or bone lesion. Cord:  Normal signal and morphology. 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 No osseous erosion on previous CT. Suspected thyromegaly, incompletely evaluated on this thoracic spine exam. Consider sonography for further evaluation. Disc levels: Central protrusion at T7-8 effaces the anterior subarachnoid space,  but does not result in significant stenosis or cord flattening. MRI LUMBAR SPINE FINDINGS Segmentation:  Standard. Alignment:  Physiologic. Vertebrae:  No fracture, evidence of discitis, or bone lesion. Conus medullaris: Extends to the L1 level and appears normal. Paraspinal and other soft tissues: Uterine enlargement, possible fibroids. Increased body habitus. Disc levels: Disc desiccation at L5-S1. Small central protrusion. This is noncompressive. Lower lumbar facet arthropathy is also noted. Prior lumbar MRI, similar appearance. IMPRESSION: No areas of significant cord compression in the thoracic spine to correlate with myelopathy. No abnormal enhancement in the thoracic region. Shallow central protrusion at T7-8, noncompressive. Shallow central protrusion at L5-S1, noncompressive. No abnormal enhancement in the lumbar region. No evidence for drop metastases from the patient's previous  surgically removed ependymoma. Suspected thyromegaly, incompletely evaluated. Uterine enlargement, possible fibroids. 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 Electronically Signed   By: Staci Righter M.D.   On: 06/30/2017 11:32   US Ob Comp Less 14 Wks  Result Date: 07/20/2017 CLINICAL DATA:  Right-sided ectopic pregnancy, recently treated with methotrexate. Worsening pelvic pain. EXAM: OBSTETRIC <14 WK [place] AND TRANSVAGINAL OB US TECHNIQUE: Both transabdominal and transvaginal ultrasound examinations were performed for complete evaluation of the gestation as well as the maternal uterus, adnexal regions, and pelvic cul-de-sac. Transvaginal technique was performed to assess early pregnancy. COMPARISON:  07/19/2017 FINDINGS: Intrauterine gestational sac: None Maternal uterus/adnexae: Again seen is a cystic structure in the right adnexa which measures approximately 2.8 x 2.1 x 2.4 cm. This was better visualized on previous exam but has not  significant changed in size, and remains consistent with ectopic pregnancy. No evidence of hemoperitoneum. IMPRESSION: Right-sided ectopic pregnancy again seen, and without significant change in size. No evidence of hemoperitoneum. Electronically Signed   By: Earle Gell M.D.   On: 07/20/2017 20:55   US Ob Comp Less 14 Wks  Result Date: 07/19/2017 CLINICAL DATA:  Intermittent sharp right lower quadrant pain and cramping for 2 weeks. Rising beta HCG. EXAM: OBSTETRIC <14 WK [place] AND TRANSVAGINAL OB US TECHNIQUE: Both transabdominal and transvaginal ultrasound examinations were performed for complete evaluation of the gestation as well as the maternal uterus, adnexal regions, and pelvic cul-de-sac. Transvaginal technique was performed to assess early pregnancy. COMPARISON:  None. FINDINGS: Intrauterine gestational sac:  Not visualized. Yolk sac:  Not visualized. Embryo:  Not visualized. Cardiac Activity: Not visualized. Subchorionic hemorrhage:  None visualized. Maternal uterus/adnexae: Ovaries are visualized. There is a round ring-like structure in the right adnexa measuring 2.2 x 2.2 x 2.4 cm, containing a yolk sac and possible embryo. No free fluid. IMPRESSION: Findings most consistent with an ectopic pregnancy in the right fallopian tube. Critical Value/emergent results were called by telephone at the time of interpretation on 07/19/2017 at 11:40 am to Dr. Laurena Bering, who verbally acknowledged these results. Electronically Signed   By: Lorin Picket M.D.   On: 07/19/2017 11:44   US Ob Comp Less 14 Wks  Result Date: 07/16/2017 CLINICAL DATA:  Abdominal pain, quantitative HCG greater than 14,000 EXAM: OBSTETRIC <14 WK [place] AND TRANSVAGINAL OB US TECHNIQUE: Both transabdominal and transvaginal ultrasound examinations were performed for complete evaluation of the gestation as well as the maternal uterus, adnexal regions, and pelvic cul-de-sac. Transvaginal technique was performed to assess early pregnancy. COMPARISON:   None. FINDINGS: Difficult scan due to body habitus and bowel gas. Intrauterine gestational sac: Questionable intrauterine fluid collection. Yolk sac:  Not visualized Embryo:  Not visualized MSD: 8.4   mm   5 w   4  d Subchorionic hemorrhage:  None visualized. Maternal uterus/adnexae: Multiple cervical cysts. Right adnexa and ovary obscured by bowel gas. Left ovary measures 4.4 x 3.6 x 3.3 cm and may contain corpus luteal cyst. No significant free fluid. IMPRESSION: 1. Difficult study due to body habitus and bowel gas which obscures right ovary and adnexa. 2. Questionable fluid collection/small gestational sac within the uterus, and no yolk sac or embryo is visualized. Recommend trending of beta HCG with repeat pelvic ultrasound as indicated. Electronically Signed   By: Donavan Foil M.D.   On: 07/16/2017 23:30   US Ob Transvaginal  Result Date: 07/20/2017 CLINICAL DATA:  Right-sided ectopic pregnancy, recently treated  with methotrexate. Worsening pelvic pain. EXAM: OBSTETRIC <14 WK [place] AND TRANSVAGINAL OB US TECHNIQUE: Both transabdominal and transvaginal ultrasound examinations were performed for complete evaluation of the gestation as well as the maternal uterus, adnexal regions, and pelvic cul-de-sac. Transvaginal technique was performed to assess early pregnancy. COMPARISON:  07/19/2017 FINDINGS: Intrauterine gestational sac: None Maternal uterus/adnexae: Again seen is a cystic structure in the right adnexa which measures approximately 2.8 x 2.1 x 2.4 cm. This was better visualized on previous exam but has not significant changed in size, and remains consistent with ectopic pregnancy. No evidence of hemoperitoneum. IMPRESSION: Right-sided ectopic pregnancy again seen, and without significant change in size. No evidence of hemoperitoneum. Electronically Signed   By: Earle Gell M.D.   On: 07/20/2017 20:55   US Ob Transvaginal  Result Date: 07/19/2017 CLINICAL DATA:  Intermittent sharp right lower quadrant  pain and cramping for 2 weeks. Rising beta HCG. EXAM: OBSTETRIC <14 WK [place] AND TRANSVAGINAL OB US TECHNIQUE: Both transabdominal and transvaginal ultrasound examinations were performed for complete evaluation of the gestation as well as the maternal uterus, adnexal regions, and pelvic cul-de-sac. Transvaginal technique was performed to assess early pregnancy. COMPARISON:  None. FINDINGS: Intrauterine gestational sac:  Not visualized. Yolk sac:  Not visualized. Embryo:  Not visualized. Cardiac Activity: Not visualized. Subchorionic hemorrhage:  None visualized. Maternal uterus/adnexae: Ovaries are visualized. There is a round ring-like structure in the right adnexa measuring 2.2 x 2.2 x 2.4 cm, containing a yolk sac and possible embryo. No free fluid. IMPRESSION: Findings most consistent with an ectopic pregnancy in the right fallopian tube. Critical Value/emergent results were called by telephone at the time of interpretation on 07/19/2017 at 11:40 am to Dr. Laurena Bering, who verbally acknowledged these results. Electronically Signed   By: Lorin Picket M.D.   On: 07/19/2017 11:44   US Ob Transvaginal  Result Date: 07/16/2017 CLINICAL DATA:  Abdominal pain, quantitative HCG greater than 14,000 EXAM: OBSTETRIC <14 WK [place] AND TRANSVAGINAL OB US TECHNIQUE: Both transabdominal and transvaginal ultrasound examinations were performed for complete evaluation of the gestation as well as the maternal uterus, adnexal regions, and pelvic cul-de-sac. Transvaginal technique was performed to assess early pregnancy. COMPARISON:  None. FINDINGS: Difficult scan due to body habitus and bowel gas. Intrauterine gestational sac: Questionable intrauterine fluid collection. Yolk sac:  Not visualized Embryo:  Not visualized MSD: 8.4   mm   5 w   4  d Subchorionic hemorrhage:  None visualized. Maternal uterus/adnexae: Multiple cervical cysts. Right adnexa and ovary obscured by bowel gas. Left ovary measures 4.4 x 3.6 x 3.3 cm and may contain  corpus luteal cyst. No significant free fluid. IMPRESSION: 1. Difficult study due to body habitus and bowel gas which obscures right ovary and adnexa. 2. Questionable fluid collection/small gestational sac within the uterus, and no yolk sac or embryo is visualized. Recommend trending of beta HCG with repeat pelvic ultrasound as indicated. Electronically Signed   By: Donavan Foil M.D.   On: 07/16/2017 23:30    Disposition: 01-Home or Self Care  Discharge Instructions    Call MD for:  persistant nausea and vomiting    Complete by:  As directed    Call MD for:  redness, tenderness, or signs of infection (pain, swelling, redness, odor or green/yellow discharge around incision site)    Complete by:  As directed    Call MD for:  severe uncontrolled pain    Complete by:  As directed    Call MD for:  temperature >100.4    Complete by:  As directed    Diet - low sodium heart healthy    Complete by:  As directed    Diet Carb Modified    Complete by:  As directed    Increase activity slowly    Complete by:  As directed    Lifting restrictions    Complete by:  As directed    Nothing > 20 lbs x 2 wks   Sexual Activity Restrictions    Complete by:  As directed    None x 1 wk      Follow-up [facility] for [place] Follow up in 2 week(s).   Specialty:  Obstetrics and Gynecology Why:  they will call you with an appointment Contact information: [place] [phone]           Signed: [name] 07/21/2017, 9:52 AM

## 2017-07-21 NOTE — Anesthesia Postprocedure Evaluation (Signed)
Anesthesia Post Note  Patient: Cynthia Rosales  Procedure(s) Performed: Procedure(s) (LRB): LAPAROSCOPY OPERATIVE WITH WEDGE RESECTION RIGHT CORNUA AND PARTIAL SALPINGECTOMY (N/A)     Patient location during evaluation: PACU Anesthesia Type: General Level of consciousness: awake and alert Pain management: pain level controlled Vital Signs Assessment: post-procedure vital signs reviewed and stable Respiratory status: spontaneous breathing, nonlabored ventilation, respiratory function stable and patient connected to nasal cannula oxygen Cardiovascular status: blood pressure returned to baseline and stable Postop Assessment: no signs of nausea or vomiting Anesthetic complications: no    Last Vitals:  Vitals:   07/21/17 0549 07/21/17 0749  BP: 116/66 137/70  Pulse: 93 (!) 114  Resp: 16 16  Temp: 37.1 C 37 C  SpO2: 98% 100%    Last Pain:  Vitals:   07/21/17 0749  TempSrc: Oral  PainSc:    Pain Goal:                 Murvin Natal

## 2017-07-21 NOTE — Progress Notes (Signed)
Pt  Out in  Wheelchair   Teaching  Complete

## 2017-07-22 ENCOUNTER — Ambulatory Visit: Payer: Self-pay

## 2017-07-22 ENCOUNTER — Telehealth: Payer: Self-pay | Admitting: *Deleted

## 2017-07-22 ENCOUNTER — Encounter: Payer: Self-pay | Admitting: Family Medicine

## 2017-07-22 NOTE — Telephone Encounter (Signed)
Front desk called patient to see if she would come in earlier today for her stat hcg level for day 4 methotrexate. Patient stated that she wasn't able to come today or tomorrow because she is moving. I was asked to call patient to see if we could work out any time for her to come. After reviewing patients chart I saw that she ended up having surgery on 8/21. Patient does not need stat hcg just a post op in 2 weeks.

## 2017-08-09 ENCOUNTER — Ambulatory Visit (INDEPENDENT_AMBULATORY_CARE_PROVIDER_SITE_OTHER): Payer: Medicaid Other | Admitting: Family Medicine

## 2017-08-09 ENCOUNTER — Encounter: Payer: Self-pay | Admitting: Family Medicine

## 2017-08-09 VITALS — BP 134/77 | HR 82 | Ht 64.0 in | Wt 309.3 lb

## 2017-08-09 DIAGNOSIS — Z09 Encounter for follow-up examination after completed treatment for conditions other than malignant neoplasm: Secondary | ICD-10-CM

## 2017-08-09 DIAGNOSIS — Z3169 Encounter for other general counseling and advice on procreation: Secondary | ICD-10-CM

## 2017-08-09 MED ORDER — CONCEPT OB 130-92.4-1 MG PO CAPS: 1.0000 | ORAL_CAPSULE | Freq: Every day | ORAL | 11 refills | Status: DC

## 2017-08-09 NOTE — Progress Notes (Signed)
   Subjective:    Patient ID: Cynthia Rosales is a 36 y.o. female presenting with Follow-up  on 08/09/2017  HPI: Here following laparoscopic treatment of cornual ectopic. Doing well. Reports normal bowel and bladder function. [facility] is her primary MD. Reports desiring pregnancy. She has lost 50# already. States BS are 80-90 fasting but postprandial is 190s. On Metformin and Insulin.  Review of Systems  Constitutional: Negative for chills and fever.  Respiratory: Negative for shortness of breath.   Cardiovascular: Negative for chest pain.  Gastrointestinal: Negative for abdominal pain, nausea and vomiting.  Genitourinary: Negative for dysuria.  Skin: Negative for rash.      Objective:    BP 134/77   Pulse 82   Ht 5\' 4"  (1.626 m)   Wt (!) 309 lb 4.8 oz (140.3 kg)   LMP 06/18/2017 (Exact Date)   BMI 53.09 kg/m  Physical Exam  Constitutional: She is oriented to person, place, and time. She appears well-developed and well-nourished. No distress.  HENT:  Head: Normocephalic and atraumatic.  Eyes: No scleral icterus.  Neck: Neck supple.  Cardiovascular: Normal rate.   Pulmonary/Chest: Effort normal.  Abdominal: Soft.  Neurological: She is alert and oriented to person, place, and time.  Skin: Skin is warm and dry.  incisions are healing well.  Psychiatric: She has a normal mood and affect.        Assessment & Plan:   Postop check - healing well--would desire BTL following pregnancy. s/p right partial salpingectomy already.  Pre-conception counseling - PNVs daily, advised weight loss, tight glycemic control prior to achieving pregnancy  No Follow-up on file.  Cynthia Rosales 08/09/2017 8:24 AM

## 2017-08-09 NOTE — Patient Instructions (Signed)

## 2017-08-10 ENCOUNTER — Encounter: Payer: Self-pay | Admitting: Neurology

## 2017-08-10 ENCOUNTER — Ambulatory Visit (INDEPENDENT_AMBULATORY_CARE_PROVIDER_SITE_OTHER): Payer: Medicaid Other | Admitting: Neurology

## 2017-08-10 VITALS — BP 140/98 | HR 83 | Ht 64.0 in | Wt 310.0 lb

## 2017-08-10 DIAGNOSIS — R5382 Chronic fatigue, unspecified: Secondary | ICD-10-CM | POA: Diagnosis not present

## 2017-08-10 DIAGNOSIS — E538 Deficiency of other specified B group vitamins: Secondary | ICD-10-CM | POA: Diagnosis not present

## 2017-08-10 DIAGNOSIS — R252 Cramp and spasm: Secondary | ICD-10-CM | POA: Diagnosis not present

## 2017-08-10 DIAGNOSIS — G8250 Quadriplegia, unspecified: Secondary | ICD-10-CM | POA: Diagnosis not present

## 2017-08-10 DIAGNOSIS — R202 Paresthesia of skin: Secondary | ICD-10-CM | POA: Diagnosis not present

## 2017-08-10 MED ORDER — DIAZEPAM 5 MG PO TABS: ORAL_TABLET | ORAL | 4 refills | Status: DC

## 2017-08-10 NOTE — Patient Instructions (Signed)
Remember to drink plenty of fluid, eat healthy meals and do not skip any meals. Try to eat protein with a every meal and eat a healthy snack such as fruit or nuts in between meals. Try to keep a regular sleep-wake schedule and try to exercise daily, particularly in the form of walking, 20-30 minutes a day, if you can. .   Our phone number is [phone]. We also have an after hours call service for urgent matters and there is a physician on-call for urgent questions. For any emergencies you know to call 911 or go to the nearest emergency room   
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
## 2017-08-10 NOTE — Progress Notes (Signed)
Fax confirmation received for valium [phone].

## 2017-08-12 ENCOUNTER — Other Ambulatory Visit: Payer: Self-pay | Admitting: Neurology

## 2017-08-12 ENCOUNTER — Telehealth: Payer: Self-pay | Admitting: Neurology

## 2017-08-12 NOTE — Telephone Encounter (Signed)
Called and they did not receive fax.  They have other fax #.  I gave VO to [place], pharmacist of diazepam 5mg  tabs, take 1-2 tid prn #180 with 4 refills.

## 2017-08-12 NOTE — Telephone Encounter (Signed)
[facility]oke to [facility] and they did not receive.  I refaxed to them after confirming fax # [phone].  Received fax confirmation valium.

## 2017-08-12 NOTE — Telephone Encounter (Signed)
Pt called in to advise [facility] has not rec'd RX for diazepam (VALIUM) 5 MG tablet .

## 2017-11-15 ENCOUNTER — Encounter (HOSPITAL_COMMUNITY): Payer: Self-pay | Admitting: Emergency Medicine

## 2017-11-15 ENCOUNTER — Emergency Department (HOSPITAL_COMMUNITY): Payer: Medicaid Other

## 2017-11-15 ENCOUNTER — Emergency Department (HOSPITAL_COMMUNITY)
Admission: EM | Admit: 2017-11-15 | Discharge: 2017-11-15 | Disposition: A | Discharge status: Home / Self Care | Payer: Medicaid Other | Attending: Emergency Medicine | Admitting: Emergency Medicine

## 2017-11-15 ENCOUNTER — Other Ambulatory Visit: Payer: Self-pay

## 2017-11-15 DIAGNOSIS — Z794 Long term (current) use of insulin: Secondary | ICD-10-CM | POA: Diagnosis not present

## 2017-11-15 DIAGNOSIS — R1011 Right upper quadrant pain: Secondary | ICD-10-CM | POA: Diagnosis not present

## 2017-11-15 DIAGNOSIS — J45909 Unspecified asthma, uncomplicated: Secondary | ICD-10-CM | POA: Diagnosis not present

## 2017-11-15 DIAGNOSIS — D649 Anemia, unspecified: Secondary | ICD-10-CM | POA: Insufficient documentation

## 2017-11-15 DIAGNOSIS — I10 Essential (primary) hypertension: Secondary | ICD-10-CM | POA: Insufficient documentation

## 2017-11-15 DIAGNOSIS — R112 Nausea with vomiting, unspecified: Secondary | ICD-10-CM | POA: Diagnosis not present

## 2017-11-15 DIAGNOSIS — M25552 Pain in left hip: Secondary | ICD-10-CM

## 2017-11-15 DIAGNOSIS — E119 Type 2 diabetes mellitus without complications: Secondary | ICD-10-CM | POA: Insufficient documentation

## 2017-11-15 DIAGNOSIS — Z79899 Other long term (current) drug therapy: Secondary | ICD-10-CM | POA: Diagnosis not present

## 2017-11-15 DIAGNOSIS — Z87891 Personal history of nicotine dependence: Secondary | ICD-10-CM | POA: Insufficient documentation

## 2017-11-15 DIAGNOSIS — D571 Sickle-cell disease without crisis: Secondary | ICD-10-CM | POA: Diagnosis not present

## 2017-11-15 LAB — CBC WITH DIFFERENTIAL/PLATELET
Basophils Absolute: 0 10*3/uL (ref 0.0–0.1)
Basophils Relative: 0 %
Eosinophils Absolute: 0.1 10*3/uL (ref 0.0–0.7)
Eosinophils Relative: 1 %
HCT: 32 % — ABNORMAL LOW (ref 36.0–46.0)
Hemoglobin: 10.2 g/dL — ABNORMAL LOW (ref 12.0–15.0)
Lymphocytes Relative: 32 %
Lymphs Abs: 3.6 10*3/uL (ref 0.7–4.0)
MCH: 20.9 pg — ABNORMAL LOW (ref 26.0–34.0)
MCHC: 31.9 g/dL (ref 30.0–36.0)
MCV: 65.6 fL — ABNORMAL LOW (ref 78.0–100.0)
Monocytes Absolute: 0.6 10*3/uL (ref 0.1–1.0)
Monocytes Relative: 5 %
Neutro Abs: 7 10*3/uL (ref 1.7–7.7)
Neutrophils Relative %: 62 %
Platelets: 439 10*3/uL — ABNORMAL HIGH (ref 150–400)
RBC: 4.88 MIL/uL (ref 3.87–5.11)
RDW: 20.2 % — ABNORMAL HIGH (ref 11.5–15.5)
WBC: 11.3 10*3/uL — ABNORMAL HIGH (ref 4.0–10.5)

## 2017-11-15 LAB — COMPREHENSIVE METABOLIC PANEL
ALT: 13 U/L — ABNORMAL LOW (ref 14–54)
AST: 17 U/L (ref 15–41)
Albumin: 3 g/dL — ABNORMAL LOW (ref 3.5–5.0)
Alkaline Phosphatase: 83 U/L (ref 38–126)
Anion gap: 10 (ref 5–15)
BUN: 12 mg/dL (ref 6–20)
CO2: 21 mmol/L — ABNORMAL LOW (ref 22–32)
Calcium: 8.6 mg/dL — ABNORMAL LOW (ref 8.9–10.3)
Chloride: 102 mmol/L (ref 101–111)
Creatinine, Ser: 0.84 mg/dL (ref 0.44–1.00)
GFR calc Af Amer: >60 mL/min (ref >60)
GFR calc non Af Amer: >60 mL/min (ref >60)
Glucose, Bld: 242 mg/dL — ABNORMAL HIGH (ref 65–99)
Potassium: 3.9 mmol/L (ref 3.5–5.1)
Sodium: 133 mmol/L — ABNORMAL LOW (ref 135–145)
Total Bilirubin: 0.5 mg/dL (ref 0.3–1.2)
Total Protein: 7.1 g/dL (ref 6.5–8.1)

## 2017-11-15 LAB — URINALYSIS, ROUTINE W REFLEX MICROSCOPIC
Bilirubin Urine: NEGATIVE
Glucose, UA: >=500 mg/dL — AB
Hgb urine dipstick: NEGATIVE
Ketones, ur: NEGATIVE mg/dL
Leukocytes, UA: NEGATIVE
Nitrite: NEGATIVE
Protein, ur: 30 mg/dL — AB
Specific Gravity, Urine: 1.023 (ref 1.005–1.030)
pH: 5 (ref 5.0–8.0)

## 2017-11-15 LAB — LIPASE, BLOOD: Lipase: 40 U/L (ref 11–51)

## 2017-11-15 LAB — POC URINE PREG, ED: Preg Test, Ur: NEGATIVE

## 2017-11-15 MED ORDER — ONDANSETRON 4 MG PO TBDP
4.0000 mg | ORAL_TABLET | Freq: Once | ORAL | Status: DC
  Filled 2017-11-15: qty 1

## 2017-11-15 MED ORDER — METOCLOPRAMIDE HCL 10 MG PO TABS: 10.0000 mg | ORAL_TABLET | Freq: Four times a day (QID) | ORAL | 0 refills | Status: DC | PRN

## 2017-11-15 MED ORDER — HYDROMORPHONE HCL 1 MG/ML IJ SOLN
1.0000 mg | Freq: Once | INTRAMUSCULAR | Status: AC
  Administered 2017-11-15: 1 mg via INTRAMUSCULAR
  Filled 2017-11-15: qty 1

## 2017-11-15 MED ORDER — OXYCODONE HCL 5 MG PO TABS
20.0000 mg | ORAL_TABLET | Freq: Once | ORAL | Status: DC
  Filled 2017-11-15: qty 4

## 2017-11-15 NOTE — ED Notes (Signed)
PA aware of pt refusal of medication. Will continue to monitor and await orders.

## 2017-11-15 NOTE — ED Notes (Signed)
Patient transported to X-ray 

## 2017-11-15 NOTE — ED Triage Notes (Signed)
Pt c/o R flank pain x 3 days ago when attempting to get out of bed, pt also c/o R mid abd pain onset 4 days ago. Pt reports pain to L hip associated with known hip fx, pt states she attempted to get out of bed and felt "pop". Pt moaning in triage. Pt states she has Oxy 30mg  at home but did not take any meds today d/t n/v. Pt reports emesis x 5 today.

## 2017-11-15 NOTE — Discharge Instructions (Signed)
You have been evaluated for your left hip pain and abdominal pain.  Fortunately no evidence of broken bones on your xray.  Your labs are similar to prior.  Please follow up closely with your primary care provider for further evaluation of your condition.  Take reglan as needed for nausea. Return if you have any concerns.

## 2017-11-15 NOTE — ED Notes (Signed)
Pt crying in bed. Refused Oxycodone 20mg . States "I take 30mg  at home and it does nothing for me". Will speak with md.

## 2017-11-15 NOTE — ED Provider Notes (Signed)
[facility] EMERGENCY DEPARTMENT Provider Note   CSN: [id number] Arrival date & time: 11/15/17  0107     History   Chief Complaint Chief Complaint  Patient presents with  . Flank Pain    HPI Cynthia Rosales is a 36 y.o. female.  HPI   36 year old female with history of diabetes, history of renal disorder including kidney stones, sickle cell anemia, paraplegia presenting for evaluation of left hip pain and abdominal pain.  Patient states 3 days ago she was trying to get out of bed when she felt a sharp pain to her left hip with a popping sound.  Since then she has had significant pain to the left hip.  She describes the pain as sharp, stabbing, worse with movement and present at rest.  She is unable to get out of her bed due to the pain.  She felt that she may have refractured her left hip.  States that she broke her hip 3 months ago from a fall and has had recurrent pain since.  Furthermore she also report having right-sided abdominal pain that started about the same time that her hip hurts.  She describes the pain as a sharp achy sensation with associated nausea, vomited multiple times with nonbloody nonbilious content and having loose stools.  Endorse postprandial pain.  Pain is moderate to severe.  She takes her home pain medication, oxycodone but states it does not provide any relief.  She reports subjective fever and chills, pain in the chest and coughing.  She denies any dysuria or hematuria.  She has had prior cholecystectomy.  Past Medical History:  Diagnosis Date  . Abscess of bursa, left elbow 06/2013   Treated with I and D/antibiotics.   . Anemia   . Asthma   . Depression   . History of blood transfusion    "after I had one of my kids"  . Hypertension   . Ovarian cyst   . Renal disorder    kidney stones  . Sickle cell disease ([place])   . Type II diabetes mellitus [facility])     Patient Active Problem List   Diagnosis Date Noted  . Pregnancy, ectopic,  cornual or cervical 07/21/2017  . 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  . Neurogenic bladder 08/23/2015  . Bilateral carpal tunnel syndrome 08/23/2015  . Paraparesis of both lower limbs ([place]) 07/14/2015  . Ileus, postoperative ([place]) 12/25/2014  . Sickle cell disease without crisis ([place]) 12/25/2014  . Diabetes mellitus type 2 in obese ([place]) 12/25/2014  . 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[place]) 12/24/2014  . 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 Ileus of unspecified type ([place])   . Atelectasis   . Bowel obstruction ([place])   . Encounter for central line care   . Encounter for nasogastric (NG) tube placement   . Ileus ([place])   . Intestinal occlusion ([facility])   . 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  . Headache 11/19/2014  . Neck pain 11/19/2014  . Right sided weakness 11/19/2014  . Paresthesias 11/19/2014  . Nightmares 09/11/2014  . HTN (hypertension), benign 03/28/2014  . Nephrolithiasis 03/27/2014  . Diabetes mellitus ([place]) 03/27/2014  . Abdominal pain 03/27/2014    Past Surgical History:  Procedure Laterality Date  . APPENDECTOMY  2013  . CARPAL TUNNEL RELEASE    . CESAREAN SECTION  2000; 2007; 2011  . DILATION AND CURETTAGE OF UTERUS    . LAMINECTOMY N/A 12/13/2014   Procedure: CERVICAL LAMINECTOMY FOR INTRADURAL TUMOR;  Surgeon: Charlie Pitter, MD;  Location: [facility];  Service: Neurosurgery;  Laterality: N/A;  posterior  . LAPAROSCOPY N/A 07/20/2017   Procedure: LAPAROSCOPY OPERATIVE WITH WEDGE RESECTION RIGHT CORNUA AND PARTIAL SALPINGECTOMY;  Surgeon: [name], MD;  Location: [facility];  Service: Gynecology;  Laterality: N/A;  . REDUCTION MAMMAPLASTY Bilateral 1998    OB History    Gravida Para Term Preterm AB Living   5 3 2 1   3    SAB TAB Ectopic Multiple Live Births           3       Home Medications    Prior to Admission medications   Medication Sig Start Date End Date Taking? Authorizing Provider  diazepam (VALIUM) 5 MG tablet Take 1-2 tabs  (5-10mg ) 3x a day as needed 08/10/17   Melvenia Beam, MD  hydrochlorothiazide (HYDRODIURIL) 25 MG tablet Take 25 mg by mouth daily. 08/13/15   [provider]  insulin NPH-regular Human (NOVOLIN 70/30) (70-30) 100 UNIT/ML injection Inject 35 Units into the skin 3 (three) times daily before meals.    [provider]  metoCLOPramide (REGLAN) 10 MG tablet Take 1 tablet (10 mg total) by mouth every 6 (six) hours as needed for nausea or vomiting. 07/17/17   Julianne Handler, CNM  oxycodone (ROXICODONE) 30 MG immediate release tablet Take 30 mg by mouth every 6 (six) hours as needed for pain.    [provider]  Prenat w/o A Vit-FeFum-FePo-FA (CONCEPT OB) 130-92.4-1 MG CAPS Take 1 capsule by mouth daily. 08/09/17   [name], MD  SitaGLIPtin-MetFORMIN HCl (JANUMET XR) [phone] MG TB24 Take 1 tablet by mouth daily. 12/23/16   [provider]    Family History Family History  Problem Relation Age of Onset  . Diabetes Mother   . Hypertension Mother   . Breast cancer Maternal Aunt   . Ovarian cancer Maternal Grandmother   . Breast cancer Maternal Aunt   . Migraines Neg Hx     Social History Social History   Tobacco Use  . Smoking status: Former Smoker    Years: 0.00  . Smokeless tobacco: Never Used  . Tobacco comment: 03/27/2014 "smoked ~ 1 cigarette/day; quit in ~ 2013"  Substance Use Topics  . Alcohol use: Yes    Alcohol/week: 0.0 oz  . Drug use: No     Allergies   Buprenorphine hcl; Morphine and related; and Amoxicillin   Review of Systems Review of Systems  All other systems reviewed and are negative.    Physical Exam Updated Vital Signs BP (!) 136/94 (BP Location: Right Arm)   Pulse 94   Temp 99.1 F (37.3 C) (Oral)   Resp 18   Ht 5\' 4"  (1.626 m)   Wt (!) 137.4 kg (303 lb)   LMP 10/19/2017   SpO2 100%   Breastfeeding? Unknown   BMI 52.01 kg/m   Physical Exam  Constitutional: She appears well-developed and well-nourished. No  distress.  Morbidly obese female nontoxic in appearance  HENT:  Head: Atraumatic.  Eyes: Conjunctivae are normal.  Neck: Normal range of motion. Neck supple.  Cardiovascular: Normal rate and regular rhythm.  Pulmonary/Chest: Effort normal and breath sounds normal. No respiratory distress.  Abdominal: Soft. Bowel sounds are normal. She exhibits no distension. There is tenderness (Diffuse abdominal tenderness most significant to right upper quadrant without guarding or rebound tenderness).  Genitourinary:  Genitourinary Comments: CVA tenderness bilateraly  Musculoskeletal: She exhibits tenderness (Left hip: Tenderness  to left inguinal region on gentle palpation without any obvious deformity.  Decreased hip range of motion secondary to pain.).  Neurological: She is alert.  Skin: No rash noted.  Psychiatric: She has a normal mood and affect.  Nursing note and vitals reviewed.    ED Treatments / Results  Labs (all labs ordered are listed, but only abnormal results are displayed) Labs Reviewed  URINALYSIS, ROUTINE W REFLEX MICROSCOPIC - Abnormal; Notable for the following components:      Result Value   Glucose, UA >=500 (*)    Protein, ur 30 (*)    Bacteria, UA RARE (*)    Squamous Epithelial / LPF 0-5 (*)    All other components within normal limits  CBC WITH DIFFERENTIAL/PLATELET - Abnormal; Notable for the following components:   WBC 11.3 (*)    Hemoglobin 10.2 (*)    HCT 32.0 (*)    MCV 65.6 (*)    MCH 20.9 (*)    RDW 20.2 (*)    Platelets 439 (*)    All other components within normal limits  COMPREHENSIVE METABOLIC PANEL - Abnormal; Notable for the following components:   Sodium 133 (*)    CO2 21 (*)    Glucose, Bld 242 (*)    Calcium 8.6 (*)    Albumin 3.0 (*)    ALT 13 (*)    All other components within normal limits  LIPASE, BLOOD  POC URINE PREG, ED    EKG  EKG Interpretation None       Radiology Dg Hip Unilat W Or Wo Pelvis 2-3 Views Left  Result  Date: 11/15/2017 CLINICAL DATA:  Left hip pain after injury. EXAM: DG HIP (WITH OR WITHOUT PELVIS) 2-3V LEFT COMPARISON:  Radiographs 10/26/2016 FINDINGS: Exam technically limited by habitus and difficulty with positioning. No evidence of acute fracture. Pubic rami are grossly intact. Pubic symphysis and sacroiliac joints are congruent. IMPRESSION: No evidence of pelvic or left hip fracture. Technically limited by positioning. Electronically Signed   By: Jeb Levering M.D.   On: 11/15/2017 06:04    Procedures Procedures (including critical care time)  Medications Ordered in ED Medications  HYDROmorphone (DILAUDID) injection 1 mg (1 mg Intramuscular Given 11/15/17 0724)     Initial Impression / Assessment and Plan / ED Course  I have reviewed the triage vital signs and the nursing notes.  Pertinent labs & imaging results that were available during my care of the patient were reviewed by me and considered in my medical decision making (see chart for details).     BP 118/73   Pulse 99   Temp 98.7 F (37.1 C) (Oral)   Resp 17   Ht 5\' 4"  (1.626 m)   Wt (!) 137.4 kg (303 lb)   LMP 10/19/2017   SpO2 99%   Breastfeeding? Unknown   BMI 52.01 kg/m    Final Clinical Impressions(s) / ED Diagnoses   Final diagnoses:  None    ED Discharge Orders    None     Patient presents with multiple complaints.  She is complaining of pain to her left hip.  Apparently this has been ongoing issue for the past several months.  Patient felt she may have broken her hip.  Will obtain x-ray for further evaluation.  She does takes opiate for pain.  Furthermore, patient also complaining of right-sided abdominal pain with associated postprandial pain, nausea and vomiting.  Prior cholecystectomy.  Will obtain basic labs for further evaluation.  At this  time I have low suspicion for acute pathology requiring admission or surgical intervention.  She is afebrile with stable normal vital sign.   Domenic Moras, PA-C 11/15/17 2201    Ezequiel Essex, MD 11/15/17 2223

## 2017-12-21 ENCOUNTER — Other Ambulatory Visit: Payer: Self-pay | Admitting: Neurology

## 2017-12-29 NOTE — Telephone Encounter (Signed)
Faxed signed Valium 5 mg PO prescription to pharmacy. Received a receipt of confirmation.

## 2018-01-10 ENCOUNTER — Other Ambulatory Visit: Payer: Self-pay

## 2018-01-10 ENCOUNTER — Emergency Department (HOSPITAL_COMMUNITY)
Admission: EM | Admit: 2018-01-10 | Discharge: 2018-01-11 | Disposition: A | Discharge status: Home / Self Care | Payer: Medicaid Other | Attending: Emergency Medicine | Admitting: Emergency Medicine

## 2018-01-10 ENCOUNTER — Encounter (HOSPITAL_COMMUNITY): Payer: Self-pay | Admitting: Emergency Medicine

## 2018-01-10 DIAGNOSIS — W868XXA Exposure to other electric current, initial encounter: Secondary | ICD-10-CM | POA: Insufficient documentation

## 2018-01-10 DIAGNOSIS — J45909 Unspecified asthma, uncomplicated: Secondary | ICD-10-CM | POA: Diagnosis not present

## 2018-01-10 DIAGNOSIS — D571 Sickle-cell disease without crisis: Secondary | ICD-10-CM | POA: Insufficient documentation

## 2018-01-10 DIAGNOSIS — T754XXA Electrocution, initial encounter: Secondary | ICD-10-CM

## 2018-01-10 DIAGNOSIS — I10 Essential (primary) hypertension: Secondary | ICD-10-CM | POA: Diagnosis not present

## 2018-01-10 DIAGNOSIS — F329 Major depressive disorder, single episode, unspecified: Secondary | ICD-10-CM | POA: Diagnosis not present

## 2018-01-10 DIAGNOSIS — Z79899 Other long term (current) drug therapy: Secondary | ICD-10-CM | POA: Insufficient documentation

## 2018-01-10 DIAGNOSIS — Y998 Other external cause status: Secondary | ICD-10-CM | POA: Insufficient documentation

## 2018-01-10 DIAGNOSIS — Y9389 Activity, other specified: Secondary | ICD-10-CM | POA: Diagnosis not present

## 2018-01-10 DIAGNOSIS — Z87891 Personal history of nicotine dependence: Secondary | ICD-10-CM | POA: Diagnosis not present

## 2018-01-10 DIAGNOSIS — Z794 Long term (current) use of insulin: Secondary | ICD-10-CM | POA: Insufficient documentation

## 2018-01-10 DIAGNOSIS — Y92009 Unspecified place in unspecified non-institutional (private) residence as the place of occurrence of the external cause: Secondary | ICD-10-CM | POA: Diagnosis not present

## 2018-01-10 DIAGNOSIS — E119 Type 2 diabetes mellitus without complications: Secondary | ICD-10-CM | POA: Insufficient documentation
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
## 2018-01-11 ENCOUNTER — Emergency Department (HOSPITAL_COMMUNITY): Payer: Medicaid Other

## 2018-01-11 MED ORDER — KETOROLAC TROMETHAMINE 30 MG/ML IJ SOLN
30.0000 mg | Freq: Once | INTRAMUSCULAR | Status: AC
  Administered 2018-01-11: 30 mg via INTRAMUSCULAR
  Filled 2018-01-11: qty 1

## 2018-01-11 MED ORDER — DIAZEPAM 5 MG PO TABS
5.0000 mg | ORAL_TABLET | Freq: Once | ORAL | Status: AC
  Administered 2018-01-11: 5 mg via ORAL
  Filled 2018-01-11: qty 1

## 2018-01-11 MED ORDER — NAPROXEN 500 MG PO TABS: 500.0000 mg | ORAL_TABLET | Freq: Two times a day (BID) | ORAL | 0 refills | Status: DC

## 2018-01-11 NOTE — ED Notes (Signed)
Patient transported to X-ray 

## 2018-01-11 NOTE — ED Notes (Signed)
PTAR at bedside; pt attempting to call home to make sure someone can let her in. Explained discharge instructions with prescription to pt
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
## 2018-01-11 NOTE — ED Notes (Signed)
PTAR CALLED  °

## 2018-01-24 ENCOUNTER — Telehealth: Payer: Self-pay | Admitting: *Deleted

## 2018-01-24 NOTE — Telephone Encounter (Signed)
Report from database printed & ready for MD review.

## 2018-01-24 NOTE — Telephone Encounter (Addendum)
Received PA for Diazepam. Called [employer], spoke with Clair Gulling. He stated that a PA was not needed. Diazepam is a preferred medication. Override code #10 to be entered by pharmacy in submission clarification section as pt had reportedly been on either another benzodiazepine or narcotic. In pt's med history is also taking Roxicodone. D/w Dr. Jaynee Eagles. Will check database before overriding.

## 2018-01-26 MED ORDER — DIAZEPAM 5 MG PO TABS: ORAL_TABLET | ORAL | 0 refills | Status: DC

## 2018-01-26 NOTE — Telephone Encounter (Signed)
Prescription printed

## 2018-01-26 NOTE — Telephone Encounter (Addendum)
Faxed signed Valium prescription to pharmacy. Fax line busy multiple times.

## 2018-01-26 NOTE — Telephone Encounter (Signed)
Per Dr. Jaynee Eagles, Valium prescription reordered with NO refills, #90 tablets, and changed to 1 tablet three times daily as needed. Dr. Jaynee Eagles given narcotic registry sheet.

## 2018-01-26 NOTE — Addendum Note (Signed)
Addended by: Gildardo Griffes on: 01/26/2018 01:18 PM   Modules accepted: Orders

## 2018-01-27 NOTE — Telephone Encounter (Signed)
[place] [facility] and gave verbal order for Diazepam 5 mg tablet. Take 1 tablet by mouth three times daily as needed. Dispense #90, 0 refills. Also discussed that override code #10 needed per [employer]. Dr. Jaynee Eagles has approved this override. Staff verbalized understanding and will process order.

## 2018-02-07 NOTE — Telephone Encounter (Signed)
Called patient, left message re: the risks of using benzos and opioids including respiratory depression and death. Will decrease dose of benzo. thanks

## 2018-04-01 ENCOUNTER — Emergency Department (HOSPITAL_COMMUNITY): Payer: Medicare Other

## 2018-04-01 ENCOUNTER — Encounter (HOSPITAL_COMMUNITY): Payer: Self-pay | Admitting: *Deleted

## 2018-04-01 ENCOUNTER — Inpatient Hospital Stay (HOSPITAL_COMMUNITY)
Admission: EM | Admit: 2018-04-01 | Discharge: 2018-04-06 | DRG: 418 | Disposition: A | Discharge status: Home Health | Payer: Medicare Other | Attending: Family Medicine | Admitting: Family Medicine

## 2018-04-01 DIAGNOSIS — G8929 Other chronic pain: Secondary | ICD-10-CM | POA: Diagnosis present

## 2018-04-01 DIAGNOSIS — K5900 Constipation, unspecified: Secondary | ICD-10-CM | POA: Diagnosis present

## 2018-04-01 DIAGNOSIS — Z87442 Personal history of urinary calculi: Secondary | ICD-10-CM

## 2018-04-01 DIAGNOSIS — R7989 Other specified abnormal findings of blood chemistry: Secondary | ICD-10-CM | POA: Diagnosis present

## 2018-04-01 DIAGNOSIS — B179 Acute viral hepatitis, unspecified: Secondary | ICD-10-CM

## 2018-04-01 DIAGNOSIS — R1011 Right upper quadrant pain: Secondary | ICD-10-CM

## 2018-04-01 DIAGNOSIS — D571 Sickle-cell disease without crisis: Secondary | ICD-10-CM | POA: Diagnosis present

## 2018-04-01 DIAGNOSIS — I10 Essential (primary) hypertension: Secondary | ICD-10-CM | POA: Diagnosis present

## 2018-04-01 DIAGNOSIS — Z880 Allergy status to penicillin: Secondary | ICD-10-CM | POA: Diagnosis not present

## 2018-04-01 DIAGNOSIS — K8000 Calculus of gallbladder with acute cholecystitis without obstruction: Secondary | ICD-10-CM | POA: Diagnosis present

## 2018-04-01 DIAGNOSIS — K810 Acute cholecystitis: Secondary | ICD-10-CM | POA: Diagnosis not present

## 2018-04-01 DIAGNOSIS — E869 Volume depletion, unspecified: Secondary | ICD-10-CM | POA: Diagnosis present

## 2018-04-01 DIAGNOSIS — R7401 Elevation of levels of liver transaminase levels: Secondary | ICD-10-CM

## 2018-04-01 DIAGNOSIS — Z6841 Body Mass Index (BMI) 40.0 and over, adult: Secondary | ICD-10-CM | POA: Diagnosis not present

## 2018-04-01 DIAGNOSIS — Z885 Allergy status to narcotic agent status: Secondary | ICD-10-CM | POA: Diagnosis not present

## 2018-04-01 DIAGNOSIS — E1165 Type 2 diabetes mellitus with hyperglycemia: Secondary | ICD-10-CM | POA: Diagnosis present

## 2018-04-01 DIAGNOSIS — Z87891 Personal history of nicotine dependence: Secondary | ICD-10-CM

## 2018-04-01 DIAGNOSIS — Z79899 Other long term (current) drug therapy: Secondary | ICD-10-CM

## 2018-04-01 DIAGNOSIS — N859 Noninflammatory disorder of uterus, unspecified: Secondary | ICD-10-CM | POA: Diagnosis not present

## 2018-04-01 DIAGNOSIS — R740 Nonspecific elevation of levels of transaminase and lactic acid dehydrogenase [LDH]: Secondary | ICD-10-CM

## 2018-04-01 DIAGNOSIS — F419 Anxiety disorder, unspecified: Secondary | ICD-10-CM | POA: Diagnosis present

## 2018-04-01 DIAGNOSIS — Z8249 Family history of ischemic heart disease and other diseases of the circulatory system: Secondary | ICD-10-CM

## 2018-04-01 DIAGNOSIS — Z794 Long term (current) use of insulin: Secondary | ICD-10-CM

## 2018-04-01 DIAGNOSIS — R112 Nausea with vomiting, unspecified: Secondary | ICD-10-CM | POA: Diagnosis present

## 2018-04-01 DIAGNOSIS — Z90721 Acquired absence of ovaries, unilateral: Secondary | ICD-10-CM | POA: Diagnosis not present

## 2018-04-01 DIAGNOSIS — R945 Abnormal results of liver function studies: Secondary | ICD-10-CM

## 2018-04-01 DIAGNOSIS — N858 Other specified noninflammatory disorders of uterus: Secondary | ICD-10-CM

## 2018-04-01 DIAGNOSIS — F329 Major depressive disorder, single episode, unspecified: Secondary | ICD-10-CM | POA: Diagnosis present

## 2018-04-01 DIAGNOSIS — E119 Type 2 diabetes mellitus without complications: Secondary | ICD-10-CM

## 2018-04-01 DIAGNOSIS — N319 Neuromuscular dysfunction of bladder, unspecified: Secondary | ICD-10-CM | POA: Diagnosis present

## 2018-04-01 DIAGNOSIS — Z791 Long term (current) use of non-steroidal anti-inflammatories (NSAID): Secondary | ICD-10-CM | POA: Diagnosis not present

## 2018-04-01 DIAGNOSIS — D259 Leiomyoma of uterus, unspecified: Secondary | ICD-10-CM | POA: Diagnosis present

## 2018-04-01 DIAGNOSIS — J45909 Unspecified asthma, uncomplicated: Secondary | ICD-10-CM | POA: Diagnosis present

## 2018-04-01 LAB — I-STAT CHEM 8, ED
BUN: 10 mg/dL (ref 6–20)
Calcium, Ion: 0.99 mmol/L — ABNORMAL LOW (ref 1.15–1.40)
Chloride: 101 mmol/L (ref 101–111)
Creatinine, Ser: 0.8 mg/dL (ref 0.44–1.00)
Glucose, Bld: 270 mg/dL — ABNORMAL HIGH (ref 65–99)
HCT: 40 % (ref 36.0–46.0)
Hemoglobin: 13.6 g/dL (ref 12.0–15.0)
Potassium: 4.2 mmol/L (ref 3.5–5.1)
Sodium: 136 mmol/L (ref 135–145)
TCO2: 23 mmol/L (ref 22–32)

## 2018-04-01 LAB — CBC WITH DIFFERENTIAL/PLATELET
Basophils Absolute: 0 10*3/uL (ref 0.0–0.1)
Basophils Relative: 0 %
Eosinophils Absolute: 0 10*3/uL (ref 0.0–0.7)
Eosinophils Relative: 0 %
HCT: 35.3 % — ABNORMAL LOW (ref 36.0–46.0)
Hemoglobin: 11.3 g/dL — ABNORMAL LOW (ref 12.0–15.0)
Lymphocytes Relative: 10 %
Lymphs Abs: 1.3 10*3/uL (ref 0.7–4.0)
MCH: 21.9 pg — ABNORMAL LOW (ref 26.0–34.0)
MCHC: 32 g/dL (ref 30.0–36.0)
MCV: 68.3 fL — ABNORMAL LOW (ref 78.0–100.0)
Monocytes Absolute: 0.4 10*3/uL (ref 0.1–1.0)
Monocytes Relative: 3 %
Neutro Abs: 10.8 10*3/uL — ABNORMAL HIGH (ref 1.7–7.7)
Neutrophils Relative %: 87 %
Platelets: 395 10*3/uL (ref 150–400)
RBC: 5.17 MIL/uL — ABNORMAL HIGH (ref 3.87–5.11)
RDW: 17.8 % — ABNORMAL HIGH (ref 11.5–15.5)
WBC: 12.5 10*3/uL — ABNORMAL HIGH (ref 4.0–10.5)

## 2018-04-01 LAB — URINALYSIS, ROUTINE W REFLEX MICROSCOPIC
Bacteria, UA: NONE SEEN
Bilirubin Urine: NEGATIVE
Glucose, UA: >=500 mg/dL — AB
Ketones, ur: 20 mg/dL — AB
Leukocytes, UA: NEGATIVE
Nitrite: NEGATIVE
Protein, ur: 30 mg/dL — AB
Specific Gravity, Urine: 1.039 — ABNORMAL HIGH (ref 1.005–1.030)
pH: 5 (ref 5.0–8.0)

## 2018-04-01 LAB — LIPASE, BLOOD: Lipase: 28 U/L (ref 11–51)

## 2018-04-01 LAB — COMPREHENSIVE METABOLIC PANEL
ALT: 850 U/L — ABNORMAL HIGH (ref 14–54)
AST: 679 U/L — ABNORMAL HIGH (ref 15–41)
Albumin: 3.4 g/dL — ABNORMAL LOW (ref 3.5–5.0)
Alkaline Phosphatase: 298 U/L — ABNORMAL HIGH (ref 38–126)
Anion gap: 17 — ABNORMAL HIGH (ref 5–15)
BUN: 9 mg/dL (ref 6–20)
CO2: 20 mmol/L — ABNORMAL LOW (ref 22–32)
Calcium: 9.2 mg/dL (ref 8.9–10.3)
Chloride: 99 mmol/L — ABNORMAL LOW (ref 101–111)
Creatinine, Ser: 0.97 mg/dL (ref 0.44–1.00)
GFR calc Af Amer: >60 mL/min (ref >60)
GFR calc non Af Amer: >60 mL/min (ref >60)
Glucose, Bld: 268 mg/dL — ABNORMAL HIGH (ref 65–99)
Potassium: 4 mmol/L (ref 3.5–5.1)
Sodium: 136 mmol/L (ref 135–145)
Total Bilirubin: 2.7 mg/dL — ABNORMAL HIGH (ref 0.3–1.2)
Total Protein: 8.2 g/dL — ABNORMAL HIGH (ref 6.5–8.1)

## 2018-04-01 LAB — I-STAT BETA HCG BLOOD, ED (MC, WL, AP ONLY): I-stat hCG, quantitative: <5 m[IU]/mL (ref <5)

## 2018-04-01 LAB — I-STAT TROPONIN, ED: Troponin i, poc: 0 ng/mL (ref 0.00–0.08)

## 2018-04-01 LAB — PROTIME-INR
INR: 1.06
Prothrombin Time: 13.7 seconds (ref 11.4–15.2)

## 2018-04-01 LAB — MAGNESIUM: Magnesium: 1.6 mg/dL — ABNORMAL LOW (ref 1.7–2.4)

## 2018-04-01 LAB — LACTATE DEHYDROGENASE: LDH: 594 U/L — ABNORMAL HIGH (ref 98–192)

## 2018-04-01 LAB — GLUCOSE, CAPILLARY: Glucose-Capillary: 166 mg/dL — ABNORMAL HIGH (ref 65–99)

## 2018-04-01 LAB — RETICULOCYTES
RBC.: 4.66 MIL/uL (ref 3.87–5.11)
Retic Count, Absolute: 41.9 10*3/uL (ref 19.0–186.0)
Retic Ct Pct: 0.9 % (ref 0.4–3.1)

## 2018-04-01 LAB — ACETAMINOPHEN LEVEL: Acetaminophen (Tylenol), Serum: <10 ug/mL — ABNORMAL LOW (ref 10–30)

## 2018-04-01 LAB — D-DIMER, QUANTITATIVE: D-Dimer, Quant: 1.32 ug/mL-FEU — ABNORMAL HIGH (ref 0.00–0.50)

## 2018-04-01 LAB — APTT: aPTT: 20 seconds — ABNORMAL LOW (ref 24–36)

## 2018-04-01 LAB — CK: Total CK: 171 U/L (ref 38–234)

## 2018-04-01 LAB — PHOSPHORUS: Phosphorus: 3.5 mg/dL (ref 2.5–4.6)

## 2018-04-01 MED ORDER — ALBUTEROL SULFATE (2.5 MG/3ML) 0.083% IN NEBU
2.5000 mg | INHALATION_SOLUTION | Freq: Four times a day (QID) | RESPIRATORY_TRACT | Status: DC | PRN
Start: 2018-04-01 — End: 2018-04-06

## 2018-04-01 MED ORDER — DIAZEPAM 5 MG PO TABS
5.0000 mg | ORAL_TABLET | Freq: Three times a day (TID) | ORAL | Status: DC | PRN
  Administered 2018-04-01: 5 mg via ORAL
  Filled 2018-04-01: qty 1

## 2018-04-01 MED ORDER — SODIUM CHLORIDE 0.9 % IV BOLUS
1000.0000 mL | Freq: Once | INTRAVENOUS | Status: AC
Start: 2018-04-01 — End: 2018-04-01
  Administered 2018-04-01: 1000 mL via INTRAVENOUS

## 2018-04-01 MED ORDER — OXYCODONE HCL 5 MG PO TABS
15.0000 mg | ORAL_TABLET | Freq: Once | ORAL | Status: AC
  Administered 2018-04-01: 15 mg via ORAL
  Filled 2018-04-01: qty 3

## 2018-04-01 MED ORDER — HYDROMORPHONE HCL 2 MG/ML IJ SOLN
1.0000 mg | Freq: Once | INTRAMUSCULAR | Status: AC
  Administered 2018-04-01: 1 mg via INTRAVENOUS
  Filled 2018-04-01: qty 1

## 2018-04-01 MED ORDER — OXYCODONE HCL 5 MG PO TABS
15.0000 mg | ORAL_TABLET | Freq: Four times a day (QID) | ORAL | Status: DC | PRN
Start: 2018-04-01 — End: 2018-04-03
  Administered 2018-04-01 – 2018-04-03 (×5): 15 mg via ORAL
  Filled 2018-04-01 (×5): qty 3

## 2018-04-01 MED ORDER — KCL IN DEXTROSE-NACL 20-5-0.45 MEQ/L-%-% IV SOLN
INTRAVENOUS | Status: DC
  Administered 2018-04-01 – 2018-04-02 (×2): via INTRAVENOUS
  Filled 2018-04-01 (×2): qty 1000

## 2018-04-01 MED ORDER — HYDROMORPHONE HCL 2 MG/ML IJ SOLN
0.5000 mg | Freq: Once | INTRAMUSCULAR | Status: AC
  Administered 2018-04-01: 0.5 mg via INTRAVENOUS
  Filled 2018-04-01: qty 1

## 2018-04-01 MED ORDER — METOCLOPRAMIDE HCL 5 MG/ML IJ SOLN
10.0000 mg | Freq: Once | INTRAMUSCULAR | Status: AC
  Administered 2018-04-01: 10 mg via INTRAVENOUS
  Filled 2018-04-01: qty 2

## 2018-04-01 MED ORDER — ONDANSETRON HCL 4 MG/2ML IJ SOLN
4.0000 mg | Freq: Four times a day (QID) | INTRAMUSCULAR | Status: DC | PRN
Start: 2018-04-01 — End: 2018-04-06
  Administered 2018-04-01 – 2018-04-04 (×4): 4 mg via INTRAVENOUS
  Filled 2018-04-01 (×3): qty 2

## 2018-04-01 MED ORDER — HYDROCHLOROTHIAZIDE 25 MG PO TABS
25.0000 mg | ORAL_TABLET | Freq: Every day | ORAL | Status: DC
  Administered 2018-04-01 – 2018-04-05 (×4): 25 mg via ORAL
  Filled 2018-04-01 (×5): qty 1

## 2018-04-01 MED ORDER — SODIUM CHLORIDE 0.9 % IV BOLUS
1000.0000 mL | Freq: Once | INTRAVENOUS | Status: AC
  Administered 2018-04-01: 1000 mL via INTRAVENOUS

## 2018-04-01 MED ORDER — INSULIN ASPART PROT & ASPART (70-30 MIX) 100 UNIT/ML ~~LOC~~ SUSP
25.0000 [IU] | Freq: Two times a day (BID) | SUBCUTANEOUS | Status: DC
  Filled 2018-04-01: qty 10

## 2018-04-01 MED ORDER — HYDROMORPHONE HCL 2 MG/ML IJ SOLN: 0.5000 mg | Freq: Once | INTRAMUSCULAR | Status: DC

## 2018-04-01 MED ORDER — IOHEXOL 300 MG/ML  SOLN
100.0000 mL | Freq: Once | INTRAMUSCULAR | Status: AC | PRN
  Administered 2018-04-01: 100 mL via INTRAVENOUS

## 2018-04-01 NOTE — Progress Notes (Signed)
Pt uses walker and electric chair at home.

## 2018-04-01 NOTE — ED Triage Notes (Signed)
Pt in from home c/o LUQ & LLQ abd pain with n/v onset today @ 7am, pt rcvd 4 mg zofran pta, A&O x4

## 2018-04-01 NOTE — Progress Notes (Signed)
Pt admitted to room 5 MW 03 via stretcher.  Pt alert and oriented.  VSS.  Pain right side 10/10 dialudid given at 1700.  Pt NPO.

## 2018-04-01 NOTE — ED Notes (Addendum)
Patient transported to [facility] 

## 2018-04-01 NOTE — ED Provider Notes (Signed)
[facility] EMERGENCY DEPARTMENT Provider Note   CSN: [id number] Arrival date & time: 04/01/18  1001     History   Chief Complaint Chief Complaint  Patient presents with  . Abdominal Pain    HPI Cynthia Rosales is a 37 y.o. 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  She states pain began suddenly yesterday at around 3 PM and has been constant and sharp.  It radiates all over.  It worsens with prolonged positioning and any movement.  She notes multiple episodes of nonbilious emesis which eventually became bloody.  She denies diarrhea but states that she is constipated.  Last bowel movement was 2 days ago and was harder in consistency.  She denies fevers but endorses chills.  She also endorses constant substernal chest pain since yesterday which she thinks may have been from vomiting.  Pain does not radiate but worsens with cough.  She does note associated shortness of breath.  She has been taking her home medications without relief of her symptoms.  She is also been using a heating pad without relief.  She received 4 mg of Zofran via EMS without relief of symptoms.  She has a prior history of SBO and ileus.  Patient is status post appendectomy and right oophorectomy after ectopic pregnancy.  Of note, patient states that she has been eating at many restaurants this week due to her friend getting married.  She states she has eaten at least 2 different [employer] this week.  She had one vodka mixed drink on Tuesday but otherwise has not had any alcohol recently.  She denies IV drug use.  She states she does not take Tylenol.  The history is provided by the patient.    Past Medical History:  Diagnosis Date  . Abscess of  bursa, left elbow 06/2013   Treated with I and D/antibiotics.   . Anemia   . Asthma   . Depression   . History of blood transfusion    "after I had one of my kids"  . Hypertension   . Ovarian cyst   . Renal disorder    kidney stones  . Sickle cell disease ([place])   . Type II diabetes mellitus [facility])     Patient Active Problem List   Diagnosis Date Noted  . Pregnancy, ectopic, cornual or cervical 07/21/2017  . 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  . Neurogenic bladder 08/23/2015  . Bilateral carpal tunnel syndrome 08/23/2015  . Paraparesis of both lower limbs ([place]) 07/14/2015  . Ileus, postoperative ([place]) 12/25/2014  . Sickle cell disease without crisis ([place]) 12/25/2014  . Diabetes mellitus type 2 in obese ([place]) 12/25/2014  . 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[place]) 12/24/2014  . 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 Ileus of unspecified type ([place])   . Atelectasis   . Bowel obstruction ([place])   . Encounter for central line care   . Encounter for nasogastric (NG) tube placement   . Ileus ([place])   . Intestinal occlusion ([facility])   . 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  . Headache 11/19/2014  . Neck pain 11/19/2014  . Right sided weakness 11/19/2014  . Paresthesias 11/19/2014  . Nightmares 09/11/2014  . HTN (hypertension), benign 03/28/2014  . Nephrolithiasis 03/27/2014  . Diabetes mellitus ([place]) 03/27/2014  . Abdominal pain  03/27/2014    Past Surgical History:  Procedure Laterality Date  . APPENDECTOMY  2013  . CARPAL TUNNEL RELEASE    . CESAREAN SECTION  2000; 2007; 2011  . DILATION AND CURETTAGE OF UTERUS    . LAMINECTOMY N/A 12/13/2014   Procedure: CERVICAL LAMINECTOMY FOR INTRADURAL TUMOR;  Surgeon: Charlie Pitter, MD;  Location: [facility];  Service: Neurosurgery;  Laterality: N/A;  posterior  . LAPAROSCOPY N/A 07/20/2017   Procedure: LAPAROSCOPY OPERATIVE WITH WEDGE RESECTION RIGHT CORNUA AND PARTIAL SALPINGECTOMY;  Surgeon: [name], MD;  Location:  [facility];  Service: Gynecology;  Laterality: N/A;  . REDUCTION MAMMAPLASTY Bilateral 1998     OB History    Gravida  5   Para  3   Term  2   Preterm  1   AB      Living  3     SAB      TAB      Ectopic      Multiple      Live Births  3            Home Medications    Prior to Admission medications   Medication Sig Start Date End Date Taking? Authorizing Provider  albuterol (PROVENTIL HFA;VENTOLIN HFA) 108 (90 Base) MCG/ACT inhaler Inhale 2 puffs into the lungs every 6 (six) hours as needed for wheezing or shortness of breath.   Yes [provider]  diazepam (VALIUM) 5 MG tablet take 1 tablet BY MOUTH THREE TIMES DAILY AS NEEDED Patient taking differently: take 1 tablet BY MOUTH THREE TIMES DAILY AS NEEDED. Muscle spasms 01/26/18  Yes Melvenia Beam, MD  hydrochlorothiazide (HYDRODIURIL) 25 MG tablet Take 25 mg by mouth daily. 08/13/15  Yes [provider]  insulin NPH-regular Human (NOVOLIN 70/30) (70-30) 100 UNIT/ML injection Inject 35-36 Units into the skin See admin instructions. Inject 35 units subcutaneously after breakfast and 36 units after supper   Yes [provider]  naproxen (NAPROSYN) 500 MG tablet Take 1 tablet (500 mg total) by mouth 2 (two) times daily. 01/11/18  Yes Horton, Barbette Hair, MD  oxycodone (ROXICODONE) 30 MG immediate release tablet Take 30 mg by mouth every 6 (six) hours as needed for pain.   Yes [provider]  sitaGLIPtin (JANUVIA) 100 MG tablet Take 100 mg by mouth daily.   Yes [provider]  Prenat w/o A Vit-FeFum-FePo-FA (CONCEPT OB) 130-92.4-1 MG CAPS Take 1 capsule by mouth daily. Patient not taking: Reported on 11/15/2017 08/09/17   [name], MD    Family History Family History  Problem Relation Age of Onset  . Diabetes Mother   . Hypertension Mother   . Breast cancer Maternal Aunt   . Ovarian cancer Maternal Grandmother   . Breast cancer Maternal Aunt   . Migraines Neg Hx      Social History Social History   Tobacco Use  . Smoking status: Former Smoker    Years: 0.00  . Smokeless tobacco: Never Used  . Tobacco comment: 03/27/2014 "smoked ~ 1 cigarette/day; quit in ~ 2013"  Substance Use Topics  . Alcohol use: Yes    Alcohol/week: 0.0 oz  . Drug use: No     Allergies   Buprenorphine hcl; Morphine and related; and Amoxicillin   Review of Systems Review of Systems  Constitutional: Positive for chills. Negative for fever.  Respiratory: Positive for shortness of breath.   Cardiovascular: Positive for chest pain.  Gastrointestinal: Positive for abdominal  pain, constipation, nausea and vomiting. Negative for blood in stool and diarrhea.  All other systems reviewed and are negative.    Physical Exam Updated Vital Signs BP 139/90   Pulse 99   Resp (!) 23   LMP 06/18/2017 (Exact Date)   SpO2 100%   Physical Exam  Constitutional: She appears well-developed and well-nourished. No distress.  Laying supine in bed, appears uncomfortable  HENT:  Head: Normocephalic and atraumatic.  Eyes: Conjunctivae are normal. Right eye exhibits no discharge. Left eye exhibits no discharge.  Neck: No JVD present. No tracheal deviation present.  Cardiovascular: Normal rate.  Pulmonary/Chest: Effort normal.  Abdominal: She exhibits no distension. There is generalized tenderness. There is guarding and CVA tenderness.  Diffuse generalized tenderness to palpation, worst in the right upper quadrant and right lower quadrant's.  Difficult to assess Percell Miller sign as patient is only taking shallow inspirations.  Bilateral CVA tenderness present.  Musculoskeletal: She exhibits no edema.  No midline spine tenderness, bilateral diffuse paralumbar muscle tenderness. No derformity, crepitus, or step off noted.   Neurological: She is alert.  Skin: Skin is warm and dry. No erythema.  Psychiatric: She has a normal mood and affect. Her behavior is normal.  Nursing note and vitals  reviewed.    ED Treatments / Results  Labs (all labs ordered are listed, but only abnormal results are displayed) Labs Reviewed  CBC WITH DIFFERENTIAL/PLATELET - Abnormal; Notable for the following components:      Result Value   WBC 12.5 (*)    RBC 5.17 (*)    Hemoglobin 11.3 (*)    HCT 35.3 (*)    MCV 68.3 (*)    MCH 21.9 (*)    RDW 17.8 (*)    Neutro Abs 10.8 (*)    All other components within normal limits  COMPREHENSIVE METABOLIC PANEL - Abnormal; Notable for the following components:   Chloride 99 (*)    CO2 20 (*)    Glucose, Bld 268 (*)    Total Protein 8.2 (*)    Albumin 3.4 (*)    AST 679 (*)    ALT 850 (*)    Alkaline Phosphatase 298 (*)    Total Bilirubin 2.7 (*)    Anion gap 17 (*)    All other components within normal limits  I-STAT CHEM 8, ED - Abnormal; Notable for the following components:   Glucose, Bld 270 (*)    Calcium, Ion 0.99 (*)    All other components within normal limits  URINE CULTURE  LIPASE, BLOOD  URINALYSIS, ROUTINE W REFLEX MICROSCOPIC  ACETAMINOPHEN LEVEL  HEPATITIS PANEL, ACUTE  I-STAT BETA HCG BLOOD, ED ([facility], [facility], [facility] ONLY)  I-STAT TROPONIN, ED    EKG EKG Interpretation  Date/Time:  Friday Apr 01 2018 11:49:21 EDT Ventricular Rate:  101 PR Interval:    QRS Duration: 84 QT Interval:  335 QTC Calculation: 435 R Axis:   75 Text Interpretation:  Sinus tachycardia Probable left atrial enlargement No significant change since last tracing Confirmed by Dorie Rank [phone]) on 04/01/2018 12:13:30 PM   Radiology Ct Abdomen Pelvis W Contrast  Result Date: 04/01/2018 CLINICAL DATA:  Left-sided abdominal pain with nausea and vomiting. EXAM: CT ABDOMEN AND PELVIS WITH CONTRAST TECHNIQUE: Multidetector CT imaging of the abdomen and pelvis was performed using the standard protocol following bolus administration of intravenous contrast. CONTRAST:  182mL OMNIPAQUE IOHEXOL 300 MG/ML  SOLN COMPARISON:  CT scan dated 06/19/2014 FINDINGS: Lower chest:  No acute abnormality. Hepatobiliary:  No focal liver abnormality is seen. No gallstones, gallbladder wall thickening, or biliary dilatation. Pancreas: Unremarkable. No pancreatic ductal dilatation or surrounding inflammatory changes. Spleen: Normal in size without focal abnormality. Adrenals/Urinary Tract: Adrenal glands are unremarkable. Kidneys are normal, without renal calculi, focal lesion, or hydronephrosis. Bladder is unremarkable. Stomach/Bowel: Stomach is within normal limits. Appendix has been removed. No evidence of bowel wall thickening, distention, or inflammatory changes. Vascular/Lymphatic: No significant vascular findings are present. No enlarged abdominal or pelvic lymph nodes. Reproductive: New 4.8 cm mass in left side of the uterus, statistically most likely a fibroid. No adnexal masses. Other: No abdominal wall hernia or abnormality. No abdominopelvic ascites. Musculoskeletal: No acute or significant osseous findings. IMPRESSION: No acute abnormalities of the abdomen or pelvis. New 4.8 cm mass in the left side of the uterus, most likely a fibroid. Electronically Signed   By: Lorriane Shire M.D.   On: 04/01/2018 14:14    Procedures Procedures (including critical care time)  Medications Ordered in ED Medications  HYDROmorphone (DILAUDID) injection 1 mg (has no administration in time range)  sodium chloride 0.9 % bolus 1,000 mL (has no administration in time range)  sodium chloride 0.9 % bolus 1,000 mL (0 mLs Intravenous Stopped 04/01/18 1505)  metoCLOPramide (REGLAN) injection 10 mg (10 mg Intravenous Given 04/01/18 1120)  HYDROmorphone (DILAUDID) injection 0.5 mg (0.5 mg Intravenous Given 04/01/18 1317)  iohexol (OMNIPAQUE) 300 MG/ML solution 100 mL (100 mLs Intravenous Contrast Given 04/01/18 1330)     Initial Impression / Assessment and Plan / ED Course  I have reviewed the triage vital signs and the nursing notes.  Pertinent labs & imaging results that were available during my care of  the patient were reviewed by me and considered in my medical decision making (see chart for details).      Dr. Penelope Coop Hepatitis panel Alcohol? IVDU? Tylenol level   Patient presents with complaint of acute onset right-sided abdominal pain, nausea, and vomiting.  She is afebrile, persistently tachycardic and hypertensive in the ED.  She appears uncomfortable and has generalized tenderness to palpation of the abdomen, worst in the right upper quadrant.  CT scan of the abdomen shows no acute abnormalities of the abdomen or pelvis with no evidence of biliary dilatation, cholecystitis, cholelithiasis.  She does have a new 4.8 cm mass in the left side of the uterus most likely a fibroid.  However, lab work reviewed by me shows LFTs that are significantly elevated.  She is also hyperglycemic with an anion gap of 17.  She also has a mild leukocytosis.  She was complaining of chest pain but her troponin is normal, EKG shows no significant changes from last tracing and I doubt acute MI/ACS.  Suspect hepatitis.  Spoke with Dr. Penelope Coop with gastroenterology who recommends obtaining hepatitis panel and Tylenol level as well as right upper quadrant ultrasound.  Patient has received multiple doses of pain medicine and nausea medicine and fluids without significant relief of her symptoms.  She has not been tolerating p.o. at home. Spoke with Dr. Marthenia Rolling with [facility] hospitalist service who agrees to assume care of patient and bring her into the hospital for further evaluation and management. Final Clinical Impressions(s) / ED Diagnoses   Final diagnoses:  RUQ pain  Elevated LFTs    ED Discharge Orders    None       Renita Papa, PA-C 04/01/18 1611    Dorie Rank, MD 04/04/18 [phone]

## 2018-04-01 NOTE — H&P (Signed)
History and Physical  Ayde Record HCW:[id number] DOB: [birth year] DOA: 04/01/2018  Referring physician: ER provider, Rodell Perna PCP: Medicine, [facility]  Outpatient Specialists:    Patient coming from: Home.  Chief Complaint: Nausea, vomiting and abdominal pain.  HPI: Patient is a 37 year old African-American female, morbidly obese, with past medical history significant for diabetes mellitus type 2, documented sickle cell disease, ovarian cyst, hypertension, asthma and depression.  According to the patient, she went for a wedding ceremony 3 days ago where she ate a lot of [place] food.  Same night of the wedding, the patient developed nausea and vomiting.  The patient has been having persistent nausea and vomiting since then.  According to the patient, she is able to keep anything down.  There is associated epigastric and right-sided abdominal pain, as well as generalized abdominal pain.  Patient tells me that her boyfriend feels that her body is warm, but she definitely reported associated chills.  No headache, no neck pain, no urinary symptoms.  The patient tells me that she feels her chest hurts when she breathes.  On presentation to the ER, CT scan of the abdomen and pelvis done was nonrevealing.  CMP revealed elevated AST, ALT and alkaline phosphatase.  Bilirubin is also elevated.  Patient will be admitted for further assessment and management.  ED Course: On presentation to the ER, CMP done revealed normal potassium (4), CO2 of 20, BUN of 9 and creatinine of 0.97, AST of 679 and AST of 850.  Total bilirubin is 2.7.  CT scan of the abdomen and pelvis did not reveal any acute abnormality, a new 4.8 cm mass in the left side of the uterus, most likely a fibroid was reported.  The patient has been worked up for possible acute hepatitis.  The patient is managed supportively.  The GI team is been consulted.   Pertinent labs: As above. EKG: Independently reviewed.  Minimal tachycardia 101  bpm. Imaging: independently reviewed.   Review of Systems:  12 systems were reviewed.  Pertinent positives and negatives as in history of presenting illness. Negative for fever, visual changes, sore throat, rash, new muscle aches, dysuria, bleeding.  Past Medical History:  Diagnosis Date  . Abscess of bursa, left elbow 06/2013   Treated with I and D/antibiotics.   . Anemia   . Asthma   . Depression   . History of blood transfusion    "after I had one of my kids"  . Hypertension   . Ovarian cyst   . Renal disorder    kidney stones  . Sickle cell disease ([place])   . Type II diabetes mellitus ([place])     Past Surgical History:  Procedure Laterality Date  . APPENDECTOMY  2013  . CARPAL TUNNEL RELEASE    . CESAREAN SECTION  2000; 2007; 2011  . DILATION AND CURETTAGE OF UTERUS    . LAMINECTOMY N/A 12/13/2014   Procedure: CERVICAL LAMINECTOMY FOR INTRADURAL TUMOR;  Surgeon: Charlie Pitter, MD;  Location: [facility];  Service: Neurosurgery;  Laterality: N/A;  posterior  . LAPAROSCOPY N/A 07/20/2017   Procedure: LAPAROSCOPY OPERATIVE WITH WEDGE RESECTION RIGHT CORNUA AND PARTIAL SALPINGECTOMY;  Surgeon: [name], MD;  Location: [facility];  Service: Gynecology;  Laterality: N/A;  . REDUCTION MAMMAPLASTY Bilateral 1998     reports that she has quit smoking. She quit after 0.00 years of use. She has never used smokeless tobacco. She reports that she drinks alcohol. She reports that she does  not use drugs.  Allergies  Allergen Reactions  . Buprenorphine Hcl Itching and Nausea Only  . Morphine And Related Itching and Nausea Only  . Amoxicillin Hives and Rash    Has patient had a PCN reaction causing immediate rash, facial/tongue/throat swelling, SOB or lightheadedness with hypotension: Yes Has patient had a PCN reaction causing severe rash involving mucus membranes or skin necrosis: No Has patient had a PCN reaction that required hospitalization: No  Has patient had a PCN reaction  occurring within the last 10 years: Yes If all of the above answers are "NO", then may proceed with Cephalosporin use.      Family History  Problem Relation Age of Onset  . Diabetes Mother   . Hypertension Mother   . Breast cancer Maternal Aunt   . Ovarian cancer Maternal Grandmother   . Breast cancer Maternal Aunt   . Migraines Neg Hx      Prior to Admission medications   Medication Sig Start Date End Date Taking? Authorizing Provider  albuterol (PROVENTIL HFA;VENTOLIN HFA) 108 (90 Base) MCG/ACT inhaler Inhale 2 puffs into the lungs every 6 (six) hours as needed for wheezing or shortness of breath.   Yes [provider]  diazepam (VALIUM) 5 MG tablet take 1 tablet BY MOUTH THREE TIMES DAILY AS NEEDED Patient taking differently: take 1 tablet BY MOUTH THREE TIMES DAILY AS NEEDED. Muscle spasms 01/26/18  Yes Melvenia Beam, MD  hydrochlorothiazide (HYDRODIURIL) 25 MG tablet Take 25 mg by mouth daily. 08/13/15  Yes [provider]  insulin NPH-regular Human (NOVOLIN 70/30) (70-30) 100 UNIT/ML injection Inject 35-36 Units into the skin See admin instructions. Inject 35 units subcutaneously after breakfast and 36 units after supper   Yes [provider]  naproxen (NAPROSYN) 500 MG tablet Take 1 tablet (500 mg total) by mouth 2 (two) times daily. 01/11/18  Yes Horton, Barbette Hair, MD  oxycodone (ROXICODONE) 30 MG immediate release tablet Take 30 mg by mouth every 6 (six) hours as needed for pain.   Yes [provider]  sitaGLIPtin (JANUVIA) 100 MG tablet Take 100 mg by mouth daily.   Yes [provider]  Prenat w/o A Vit-FeFum-FePo-FA (CONCEPT OB) 130-92.4-1 MG CAPS Take 1 capsule by mouth daily. Patient not taking: Reported on 11/15/2017 08/09/17   [name], MD    Physical Exam: Vitals:   04/01/18 1230 04/01/18 1300 04/01/18 1315 04/01/18 1530  BP: 125/78 130/84 139/90 (!) 157/88  Pulse: (!) 102  99 (!) 104  Resp: 12 (!) 22 (!) 23 17    SpO2: 100%  100% 100%     Constitutional:  . Appears calm and comfortable.  Patient is morbidly obese. Eyes:  Marland Kitchen Patient is pale, with mild jaundice.  Dry buccal mucosa.  ENMT:  . external ears, nose appear normal  Neck:  . Neck is supple. No JVD Respiratory:  . CTA bilaterally, no w/r/r.  . Respiratory effort normal. No retractions or accessory muscle use Cardiovascular:  . S1S2 . No LE extremity edema   Abdomen:  . Abdomen is morbidly obese, tender at the right abdominal area, epigastric area, mildly tender in other areas of the abdomen.  Organs are difficult to assess.   Neurologic:  . Awake and alert. . Moves all limbs.  Wt Readings from Last 3 Encounters:  01/10/18 (!) 138.3 kg (305 lb)  11/15/17 (!) 137.4 kg (303 lb)  08/10/17 (!) 140.6 kg (310 lb)    I have personally reviewed following  labs and imaging studies  Labs on Admission:  CBC: Recent Labs  Lab 04/01/18 1129 04/01/18 1153  WBC 12.5*  --   NEUTROABS 10.8*  --   HGB 11.3* 13.6  HCT 35.3* 40.0  MCV 68.3*  --   PLT 395  --    Basic Metabolic Panel: Recent Labs  Lab 04/01/18 1129 04/01/18 1153  NA 136 136  K 4.0 4.2  CL 99* 101  CO2 20*  --   GLUCOSE 268* 270*  BUN 9 10  CREATININE 0.97 0.80  CALCIUM 9.2  --    Liver Function Tests: Recent Labs  Lab 04/01/18 1129  AST 679*  ALT 850*  ALKPHOS 298*  BILITOT 2.7*  PROT 8.2*  ALBUMIN 3.4*   Recent Labs  Lab 04/01/18 1129  LIPASE 28   No results for input(s): AMMONIA in the last 168 hours. Coagulation Profile: No results for input(s): INR, PROTIME in the last 168 hours. Cardiac Enzymes: No results for input(s): CKTOTAL, CKMB, CKMBINDEX, TROPONINI in the last 168 hours. BNP (last 3 results) No results for input(s): PROBNP in the last 8760 hours. HbA1C: No results for input(s): HGBA1C in the last 72 hours. CBG: No results for input(s): GLUCAP in the last 168 hours. Lipid Profile: No results for input(s): CHOL, HDL, LDLCALC,  TRIG, CHOLHDL, LDLDIRECT in the last 72 hours. Thyroid Function Tests: No results for input(s): TSH, T4TOTAL, FREET4, T3FREE, THYROIDAB in the last 72 hours. Anemia Panel: No results for input(s): VITAMINB12, FOLATE, FERRITIN, TIBC, IRON, RETICCTPCT in the last 72 hours. Urine analysis:    Component Value Date/Time   COLORURINE AMBER (A) 04/01/2018 1548   APPEARANCEUR CLEAR 04/01/2018 1548   LABSPEC 1.039 (H) 04/01/2018 1548   PHURINE 5.0 04/01/2018 1548   GLUCOSEU >=500 (A) 04/01/2018 1548   HGBUR SMALL (A) 04/01/2018 1548   BILIRUBINUR NEGATIVE 04/01/2018 1548   KETONESUR 20 (A) 04/01/2018 1548   PROTEINUR 30 (A) 04/01/2018 1548   UROBILINOGEN 0.2 05/02/2015 0036   NITRITE NEGATIVE 04/01/2018 1548   LEUKOCYTESUR NEGATIVE 04/01/2018 1548   Sepsis Labs: @LABRCNTIP (procalcitonin:4,lacticidven:4) )No results found for this or any previous visit (from the past 240 hour(s)).    Radiological Exams on Admission: Ct Abdomen Pelvis W Contrast  Result Date: 04/01/2018 CLINICAL DATA:  Left-sided abdominal pain with nausea and vomiting. EXAM: CT ABDOMEN AND PELVIS WITH CONTRAST TECHNIQUE: Multidetector CT imaging of the abdomen and pelvis was performed using the standard protocol following bolus administration of intravenous contrast. CONTRAST:  146mL OMNIPAQUE IOHEXOL 300 MG/ML  SOLN COMPARISON:  CT scan dated 06/19/2014 FINDINGS: Lower chest: No acute abnormality. Hepatobiliary: No focal liver abnormality is seen. No gallstones, gallbladder wall thickening, or biliary dilatation. Pancreas: Unremarkable. No pancreatic ductal dilatation or surrounding inflammatory changes. Spleen: Normal in size without focal abnormality. Adrenals/Urinary Tract: Adrenal glands are unremarkable. Kidneys are normal, without renal calculi, focal lesion, or hydronephrosis. Bladder is unremarkable. Stomach/Bowel: Stomach is within normal limits. Appendix has been removed. No evidence of bowel wall thickening, distention,  or inflammatory changes. Vascular/Lymphatic: No significant vascular findings are present. No enlarged abdominal or pelvic lymph nodes. Reproductive: New 4.8 cm mass in left side of the uterus, statistically most likely a fibroid. No adnexal masses. Other: No abdominal wall hernia or abnormality. No abdominopelvic ascites. Musculoskeletal: No acute or significant osseous findings. IMPRESSION: No acute abnormalities of the abdomen or pelvis. New 4.8 cm mass in the left side of the uterus, most likely a fibroid. Electronically Signed   By: Jeneen Rinks  Maxwell M.D.   On: 04/01/2018 14:14   US Abdomen Limited Ruq  Result Date: 04/01/2018 CLINICAL DATA:  Right upper quadrant pain. EXAM: ULTRASOUND ABDOMEN LIMITED RIGHT UPPER QUADRANT COMPARISON:  CT scan dated 04/01/2018 FINDINGS: Gallbladder: Multiple gallstones. No gallbladder wall thickening. Positive sonographic Murphy's sign. Common bile duct: Diameter: 4 mm, normal. Liver: No focal lesion identified. Within normal limits in parenchymal echogenicity. Portal vein is patent on color Doppler imaging with normal direction of blood flow towards the liver. IMPRESSION: Cholelithiasis. Positive sonographic Murphy's sign. Findings suggest acute cholecystitis. Electronically Signed   By: Lorriane Shire M.D.   On: 04/01/2018 16:32    EKG: Independently reviewed.   Active Problems:   Acute hepatitis   Assessment/Plan: Persistent nausea and vomiting: -Admit patient for further assessment and management. -The etiology is unclear. -LFTs abnormal. -We will check acute hepatitis panel. -CT scan of the abdomen and pelvis is largely nonrevealing. -Abdominal ultrasound result is pending. -We will continue supportive care for now. -We will continue to hydrate patient. -Continue anti-emetics as needed. -Check urinalysis. -Monitor electrolytes and renal function.  Abnormal liver function tests: -This is worrisome for acute hepatitis. -Follow acute hepatitis  panel. -Supportive care for now. -Check PT INR. -Check CPK and urine drug screening. -Patient's body habitus is a good support for nonalcoholic fatty liver disease. -ER physician has consulted the GI team.  Volume depletion: -Continue IV fluids.  Morbid obesity: -Diet and exercise.  Documented sickle cell disease: -No pain reported. -We will check L DH and reticulocyte count.  Hypertension: -Continue to optimize.  Diabetes mellitus type 2: -Continue to optimize.  Further management will depend on hospital course.  DVT prophylaxis: SCD Code Status: Full Family Communication:  Disposition Plan: Home eventually Consults called: ER physician has already consulted the GI team Admission status: Inpatient  Time spent: 35 minutes minutes  Dana Allan, MD  [facility] Pager #: [phone] 7PM-7AM contact night coverage as above   04/01/2018, 4:53 PM

## 2018-04-02 ENCOUNTER — Other Ambulatory Visit: Payer: Self-pay

## 2018-04-02 DIAGNOSIS — D571 Sickle-cell disease without crisis: Secondary | ICD-10-CM

## 2018-04-02 DIAGNOSIS — K810 Acute cholecystitis: Secondary | ICD-10-CM

## 2018-04-02 DIAGNOSIS — Z794 Long term (current) use of insulin: Secondary | ICD-10-CM

## 2018-04-02 DIAGNOSIS — R7401 Elevation of levels of liver transaminase levels: Secondary | ICD-10-CM

## 2018-04-02 DIAGNOSIS — N858 Other specified noninflammatory disorders of uterus: Secondary | ICD-10-CM

## 2018-04-02 DIAGNOSIS — R740 Nonspecific elevation of levels of transaminase and lactic acid dehydrogenase [LDH]: Secondary | ICD-10-CM

## 2018-04-02 DIAGNOSIS — E1165 Type 2 diabetes mellitus with hyperglycemia: Secondary | ICD-10-CM

## 2018-04-02 DIAGNOSIS — I10 Essential (primary) hypertension: Secondary | ICD-10-CM

## 2018-04-02 DIAGNOSIS — Z6841 Body Mass Index (BMI) 40.0 and over, adult: Secondary | ICD-10-CM

## 2018-04-02 LAB — COMPREHENSIVE METABOLIC PANEL
ALT: 604 U/L — ABNORMAL HIGH (ref 14–54)
AST: 340 U/L — ABNORMAL HIGH (ref 15–41)
Albumin: 2.6 g/dL — ABNORMAL LOW (ref 3.5–5.0)
Alkaline Phosphatase: 255 U/L — ABNORMAL HIGH (ref 38–126)
Anion gap: 11 (ref 5–15)
BUN: 8 mg/dL (ref 6–20)
CO2: 24 mmol/L (ref 22–32)
Calcium: 8.1 mg/dL — ABNORMAL LOW (ref 8.9–10.3)
Chloride: 97 mmol/L — ABNORMAL LOW (ref 101–111)
Creatinine, Ser: 1 mg/dL (ref 0.44–1.00)
GFR calc Af Amer: >60 mL/min (ref >60)
GFR calc non Af Amer: >60 mL/min (ref >60)
Glucose, Bld: 285 mg/dL — ABNORMAL HIGH (ref 65–99)
Potassium: 3.6 mmol/L (ref 3.5–5.1)
Sodium: 132 mmol/L — ABNORMAL LOW (ref 135–145)
Total Bilirubin: 2.3 mg/dL — ABNORMAL HIGH (ref 0.3–1.2)
Total Protein: 6.6 g/dL (ref 6.5–8.1)

## 2018-04-02 LAB — GLUCOSE, CAPILLARY
Glucose-Capillary: 264 mg/dL — ABNORMAL HIGH (ref 65–99)
Glucose-Capillary: 275 mg/dL — ABNORMAL HIGH (ref 65–99)
Glucose-Capillary: 239 mg/dL — ABNORMAL HIGH (ref 65–99)
Glucose-Capillary: 224 mg/dL — ABNORMAL HIGH (ref 65–99)
Glucose-Capillary: 127 mg/dL — ABNORMAL HIGH (ref 65–99)
Glucose-Capillary: 177 mg/dL — ABNORMAL HIGH (ref 65–99)

## 2018-04-02 LAB — CBC
HCT: 28.2 % — ABNORMAL LOW (ref 36.0–46.0)
Hemoglobin: 9 g/dL — ABNORMAL LOW (ref 12.0–15.0)
MCH: 21.7 pg — ABNORMAL LOW (ref 26.0–34.0)
MCHC: 31.9 g/dL (ref 30.0–36.0)
MCV: 68 fL — ABNORMAL LOW (ref 78.0–100.0)
Platelets: 351 10*3/uL (ref 150–400)
RBC: 4.15 MIL/uL (ref 3.87–5.11)
RDW: 18.1 % — ABNORMAL HIGH (ref 11.5–15.5)
WBC: 11.3 10*3/uL — ABNORMAL HIGH (ref 4.0–10.5)

## 2018-04-02 LAB — URINE CULTURE

## 2018-04-02 LAB — LIPASE, BLOOD: Lipase: 30 U/L (ref 11–51)

## 2018-04-02 LAB — HEPATITIS PANEL, ACUTE
HCV Ab: <0.1 s/co ratio (ref 0.0–0.9)
Hep A IgM: NEGATIVE
Hep B C IgM: NEGATIVE
Hepatitis B Surface Ag: NEGATIVE

## 2018-04-02 LAB — RAPID URINE DRUG SCREEN, HOSP PERFORMED
Amphetamines: NOT DETECTED
Barbiturates: NOT DETECTED
Benzodiazepines: POSITIVE — AB
Cocaine: NOT DETECTED
Opiates: POSITIVE — AB
Tetrahydrocannabinol: POSITIVE — AB

## 2018-04-02 LAB — SURGICAL PCR SCREEN
MRSA, PCR: NEGATIVE
Staphylococcus aureus: NEGATIVE

## 2018-04-02 MED ORDER — CELECOXIB 200 MG PO CAPS
200.0000 mg | ORAL_CAPSULE | ORAL | Status: AC
  Administered 2018-04-03: 200 mg via ORAL
  Filled 2018-04-02: qty 1

## 2018-04-02 MED ORDER — PROCHLORPERAZINE EDISYLATE 10 MG/2ML IJ SOLN: 5.0000 mg | INTRAMUSCULAR | Status: DC | PRN

## 2018-04-02 MED ORDER — GUAIFENESIN-DM 100-10 MG/5ML PO SYRP: 10.0000 mL | ORAL_SOLUTION | ORAL | Status: DC | PRN

## 2018-04-02 MED ORDER — ACETAMINOPHEN 325 MG PO TABS: 325.0000 mg | ORAL_TABLET | Freq: Four times a day (QID) | ORAL | Status: DC | PRN

## 2018-04-02 MED ORDER — ACETAMINOPHEN 650 MG RE SUPP: 650.0000 mg | Freq: Four times a day (QID) | RECTAL | Status: DC | PRN

## 2018-04-02 MED ORDER — HYDROCORTISONE 1 % EX CREA
1.0000 "application " | TOPICAL_CREAM | Freq: Three times a day (TID) | CUTANEOUS | Status: DC | PRN
  Filled 2018-04-02: qty 28

## 2018-04-02 MED ORDER — HYDROMORPHONE HCL 1 MG/ML IJ SOLN
0.5000 mg | INTRAMUSCULAR | Status: DC | PRN
  Administered 2018-04-02: 2 mg via INTRAVENOUS
  Administered 2018-04-03 (×3): 1 mg via INTRAVENOUS
  Administered 2018-04-04: 2 mg via INTRAVENOUS
  Filled 2018-04-02: qty 1
  Filled 2018-04-02: qty 2
  Filled 2018-04-02: qty 1
  Filled 2018-04-02: qty 2
  Filled 2018-04-02: qty 1

## 2018-04-02 MED ORDER — METRONIDAZOLE IN NACL 5-0.79 MG/ML-% IV SOLN
500.0000 mg | INTRAVENOUS | Status: DC
  Filled 2018-04-02: qty 100

## 2018-04-02 MED ORDER — HYDROCORTISONE 2.5 % RE CREA
1.0000 "application " | TOPICAL_CREAM | Freq: Four times a day (QID) | RECTAL | Status: DC | PRN
  Filled 2018-04-02: qty 28.35

## 2018-04-02 MED ORDER — SODIUM CHLORIDE 0.9 % IV SOLN
1.0000 g | Freq: Three times a day (TID) | INTRAVENOUS | Status: DC
  Filled 2018-04-02: qty 1

## 2018-04-02 MED ORDER — SODIUM CHLORIDE 0.9 % IV SOLN
INTRAVENOUS | Status: DC
  Administered 2018-04-02 – 2018-04-04 (×4): via INTRAVENOUS

## 2018-04-02 MED ORDER — IBUPROFEN 400 MG PO TABS: 400.0000 mg | ORAL_TABLET | Freq: Four times a day (QID) | ORAL | Status: DC | PRN

## 2018-04-02 MED ORDER — HYDROMORPHONE HCL 1 MG/ML IJ SOLN
1.0000 mg | INTRAMUSCULAR | Status: DC | PRN
  Administered 2018-04-02: 1 mg via INTRAVENOUS
  Filled 2018-04-02: qty 1

## 2018-04-02 MED ORDER — METRONIDAZOLE IN NACL 5-0.79 MG/ML-% IV SOLN
500.0000 mg | Freq: Three times a day (TID) | INTRAVENOUS | Status: DC
  Administered 2018-04-02 – 2018-04-03 (×4): 500 mg via INTRAVENOUS
  Filled 2018-04-02 (×3): qty 100

## 2018-04-02 MED ORDER — LACTATED RINGERS IV BOLUS: 1000.0000 mL | Freq: Three times a day (TID) | INTRAVENOUS | Status: DC | PRN

## 2018-04-02 MED ORDER — MAGIC MOUTHWASH: 15.0000 mL | Freq: Four times a day (QID) | ORAL | Status: DC | PRN

## 2018-04-02 MED ORDER — ACETAMINOPHEN 500 MG PO TABS
1000.0000 mg | ORAL_TABLET | ORAL | Status: AC
  Administered 2018-04-03: 1000 mg via ORAL
  Filled 2018-04-02: qty 2

## 2018-04-02 MED ORDER — BISACODYL 10 MG RE SUPP: 10.0000 mg | Freq: Two times a day (BID) | RECTAL | Status: DC | PRN

## 2018-04-02 MED ORDER — BLISTEX MEDICATED EX OINT
1.0000 "application " | TOPICAL_OINTMENT | Freq: Two times a day (BID) | CUTANEOUS | Status: DC
  Administered 2018-04-02 – 2018-04-06 (×7): 1 via TOPICAL
  Filled 2018-04-02: qty 6.3

## 2018-04-02 MED ORDER — MENTHOL 3 MG MT LOZG: 1.0000 | LOZENGE | OROMUCOSAL | Status: DC | PRN

## 2018-04-02 MED ORDER — GABAPENTIN 300 MG PO CAPS
300.0000 mg | ORAL_CAPSULE | ORAL | Status: AC
  Administered 2018-04-03: 300 mg via ORAL
  Filled 2018-04-02: qty 1

## 2018-04-02 MED ORDER — METHOCARBAMOL 500 MG PO TABS: 1000.0000 mg | ORAL_TABLET | Freq: Four times a day (QID) | ORAL | Status: DC | PRN

## 2018-04-02 MED ORDER — INSULIN ASPART 100 UNIT/ML ~~LOC~~ SOLN
0.0000 [IU] | SUBCUTANEOUS | Status: DC
  Administered 2018-04-02 (×2): 5 [IU] via SUBCUTANEOUS
  Administered 2018-04-02: 2 [IU] via SUBCUTANEOUS
  Administered 2018-04-02: 3 [IU] via SUBCUTANEOUS
  Administered 2018-04-03: 2 [IU] via SUBCUTANEOUS
  Administered 2018-04-03 (×2): 3 [IU] via SUBCUTANEOUS
  Administered 2018-04-03 (×2): 5 [IU] via SUBCUTANEOUS
  Administered 2018-04-04 (×3): 3 [IU] via SUBCUTANEOUS

## 2018-04-02 MED ORDER — ALUM & MAG HYDROXIDE-SIMETH 200-200-20 MG/5ML PO SUSP: 30.0000 mL | Freq: Four times a day (QID) | ORAL | Status: DC | PRN

## 2018-04-02 MED ORDER — METOCLOPRAMIDE HCL 5 MG/ML IJ SOLN
10.0000 mg | Freq: Four times a day (QID) | INTRAMUSCULAR | Status: DC | PRN
  Administered 2018-04-02: 10 mg via INTRAVENOUS
  Filled 2018-04-02: qty 2

## 2018-04-02 MED ORDER — CHLORHEXIDINE GLUCONATE CLOTH 2 % EX PADS
6.0000 | MEDICATED_PAD | Freq: Once | CUTANEOUS | Status: AC
  Administered 2018-04-02: 6 via TOPICAL

## 2018-04-02 MED ORDER — SODIUM CHLORIDE 0.9 % IV SOLN
2.0000 g | INTRAVENOUS | Status: DC
  Filled 2018-04-02: qty 20

## 2018-04-02 MED ORDER — METHOCARBAMOL 1000 MG/10ML IJ SOLN
1000.0000 mg | Freq: Four times a day (QID) | INTRAMUSCULAR | Status: DC | PRN
  Filled 2018-04-02: qty 10

## 2018-04-02 MED ORDER — PHENOL 1.4 % MT LIQD: 1.0000 | OROMUCOSAL | Status: DC | PRN

## 2018-04-02 MED ORDER — METHOCARBAMOL 500 MG PO TABS
1000.0000 mg | ORAL_TABLET | Freq: Four times a day (QID) | ORAL | Status: DC | PRN
  Administered 2018-04-02 – 2018-04-03 (×2): 1000 mg via ORAL
  Filled 2018-04-02 (×2): qty 2

## 2018-04-02 MED ORDER — CHLORHEXIDINE GLUCONATE CLOTH 2 % EX PADS
6.0000 | MEDICATED_PAD | Freq: Once | CUTANEOUS | Status: AC
  Administered 2018-04-03: 6 via TOPICAL

## 2018-04-02 MED ORDER — LACTATED RINGERS IV BOLUS
1000.0000 mL | Freq: Once | INTRAVENOUS | Status: AC
  Administered 2018-04-02: 1000 mL via INTRAVENOUS

## 2018-04-02 MED ORDER — HYDROMORPHONE HCL 1 MG/ML IJ SOLN: 1.0000 mg | INTRAMUSCULAR | Status: DC | PRN

## 2018-04-02 MED ORDER — METHOCARBAMOL 1000 MG/10ML IJ SOLN: 1000.0000 mg | Freq: Four times a day (QID) | INTRAVENOUS | Status: DC | PRN

## 2018-04-02 MED ORDER — SODIUM CHLORIDE 0.9 % IV SOLN
2.0000 g | INTRAVENOUS | Status: DC
  Administered 2018-04-02 – 2018-04-03 (×2): 2 g via INTRAVENOUS
  Filled 2018-04-02 (×2): qty 20

## 2018-04-02 NOTE — Progress Notes (Signed)
Pharmacy Antibiotic Note  Cynthia Rosales is a 37 y.o. female admitted on 04/01/2018 with Acute Cholecystitis.  Pharmacy has been consulted for cefepime and metronidazole dosing. Pt has an amoxicillin allergy that caused hives/rash, so after discussion with MD, did not go with zosyn. WBC slightly elevated at 11.3, liver enzymes elevated, afebrile, severe abdominal pain and n/v. Consulting gen surg.  Plan: Start cefepime 1g Q8hrs Start flagyl 500mg  Q8hrs Monitor clinical status, LOT  Height: 5\' 4"  (162.6 cm) Weight: (!) 305 lb 1.9 oz (138.4 kg) IBW/kg (Calculated) : 54.7  Temp (24hrs), Avg:98.6 F (37 C), Min:98.3 F (36.8 C), Max:99 F (37.2 C)  Recent Labs  Lab 04/01/18 1129 04/01/18 1153 04/02/18 0631  WBC 12.5*  --  11.3*  CREATININE 0.97 0.80 1.00    Estimated Creatinine Clearance: 105.2 mL/min (by C-G formula based on SCr of 1 mg/dL).    Allergies  Allergen Reactions  . Buprenorphine Hcl Itching and Nausea Only  . Morphine And Related Itching and Nausea Only  . Amoxicillin Hives and Rash    Has patient had a PCN reaction causing immediate rash, facial/tongue/throat swelling, SOB or lightheadedness with hypotension: Yes Has patient had a PCN reaction causing severe rash involving mucus membranes or skin necrosis: No Has patient had a PCN reaction that required hospitalization: No  Has patient had a PCN reaction occurring within the last 10 years: Yes If all of the above answers are "NO", then may proceed with Cephalosporin use.      Thank you for allowing pharmacy to be a part of this patient's care.   Patterson Hammersmith PharmD PGY1 Pharmacy Practice Resident 04/02/2018 10:26 AM Pager: [phone] Phone: [phone]

## 2018-04-02 NOTE — Progress Notes (Addendum)
PROGRESS NOTE    Cynthia Rosales  QMG:[id number] DOB: May 21, 1981 DOA: 04/01/2018 PCP: [facility], [facility]   Brief Narrative: Cynthia Rosales is a 37 y.o. female with a history of sickle cell disease, diabetes mellitus, hypertension, depression.  Patient presented with abdominal pain, nausea, and vomiting.  Initial concern for acute hepatitis, secondary to unremarkable CT scan.  Right upper quadrant ultrasound was obtained and was significant for acute cholecystitis.   Assessment & Plan:   Principal Problem:   Acute cholecystitis Active Problems:   Diabetes mellitus ([place])   HTN (hypertension), benign   Sickle cell disease without crisis ([place])   Acute cholecystitis Patient with associated elevated liver enzymes and alk phos.  Pain is significant.  Acute hepatitis panel is negative. -Consult general surgery for possible cholecystectomy -Dilaudid 1 mg every 3 hours while n.p.o. -Continue home oxycodone while able to take by mouth -We will start Cefepime for empiric treatment  Diabetes mellitus Uncontrolled, insulin-dependent, hyperglycemia.  Patient on 70/30 as an outpatient. -Discontinue 70/30 while patient is n.p.o. -Start sliding scale insulin every 4 hours while n.p.o.  Sickle cell disease Patient currently not in crisis.  Hemoglobin down to 9 which is close at the patient's baseline.  High threshold for transfusion.  Morbid obesity Body mass index is 52.37 kg/m.   DVT prophylaxis: SCDs Code Status:   Code Status: Full Code Family Communication: Son at bedside Disposition Plan: Anticipate discharge home after management of cholecystitis   Consultants:   General surgery  Procedures:   None  Antimicrobials:  Cefepime (5/4>>   Subjective: Patient reports significant pain overnight.  Objective: Vitals:   04/01/18 1822 04/01/18 2027 04/02/18 0433 04/02/18 0633  BP: 127/82 121/60 114/83   Pulse: (!) 103 99 (!) 105   Resp: 18  19   Temp: 99 F  (37.2 C) 98.6 F (37 C) 98.3 F (36.8 C)   TempSrc: Oral Oral Oral   SpO2: 99% 92% 98%   Weight:  (!) 138.4 kg (305 lb 1.9 oz)    Height:    _0  (1.626 m)    Intake/Output Summary (Last 24 hours) at 04/02/2018 0811 Last data filed at 04/02/2018 0700 Gross per 24 hour  Intake 1862.5 ml  Output 525 ml  Net 1337.5 ml   Filed Weights   04/01/18 2027  Weight: (!) 138.4 kg (305 lb 1.9 oz)    Examination:  General exam: Appears calm and uncomfortable patient. Significantly obese Respiratory system: Clear to auscultation. Respiratory effort normal. Cardiovascular system: S1 & S2 heard, RRR. No murmurs, rubs, gallops or clicks. Gastrointestinal system: Abdomen is nondistended, obese, soft and significantly tender. Decreased bowel sounds heard. Central nervous system: Alert and oriented. No focal neurological deficits. Extremities: No edema. No calf tenderness Skin: No cyanosis. No rashes Psychiatry: Judgement and insight appear normal. Mood & affect appropriate.     Data Reviewed: I have personally reviewed following labs and imaging studies  CBC: Recent Labs  Lab 04/01/18 1129 04/01/18 1153 04/02/18 0631  WBC 12.5*  --  11.3*  NEUTROABS 10.8*  --   --   HGB 11.3* 13.6 9.0*  HCT 35.3* 40.0 28.2*  MCV 68.3*  --  68.0*  PLT 395  --  326   Basic Metabolic Panel: Recent Labs  Lab 04/01/18 1129 04/01/18 1153 04/01/18 1914 04/02/18 0631  NA 136 136  --  132*  K 4.0 4.2  --  3.6  CL 99* 101  --  97*  CO2 20*  --   --  24  GLUCOSE 268* 270*  --  285*  BUN 9 10  --  8  CREATININE 0.97 0.80  --  1.00  CALCIUM 9.2  --   --  8.1*  MG  --   --  1.6*  --   PHOS  --   --  3.5  --    GFR: Estimated Creatinine Clearance: 105.2 mL/min (by C-G formula based on SCr of 1 mg/dL). Liver Function Tests: Recent Labs  Lab 04/01/18 1129 04/02/18 0631  AST 679* 340*  ALT 850* 604*  ALKPHOS 298* 255*  BILITOT 2.7* 2.3*  PROT 8.2* 6.6  ALBUMIN 3.4* 2.6*   Recent Labs  Lab  04/01/18 1129  LIPASE 28   No results for input(s): AMMONIA in the last 168 hours. Coagulation Profile: Recent Labs  Lab 04/01/18 1914  INR 1.06   Cardiac Enzymes: Recent Labs  Lab 04/01/18 1643  CKTOTAL 171   BNP (last 3 results) No results for input(s): PROBNP in the last 8760 hours. HbA1C: No results for input(s): HGBA1C in the last 72 hours. CBG: Recent Labs  Lab 04/01/18 2028 04/02/18 0106 04/02/18 0421 04/02/18 0756  GLUCAP 166* 264* 275* 239*   Lipid Profile: No results for input(s): CHOL, HDL, LDLCALC, TRIG, CHOLHDL, LDLDIRECT in the last 72 hours. Thyroid Function Tests: No results for input(s): TSH, T4TOTAL, FREET4, T3FREE, THYROIDAB in the last 72 hours. Anemia Panel: Recent Labs    04/01/18 1914  RETICCTPCT 0.9   Sepsis Labs: No results for input(s): PROCALCITON, LATICACIDVEN in the last 168 hours.  No results found for this or any previous visit (from the past 240 hour(s)).       Radiology Studies: Ct Abdomen Pelvis W Contrast  Result Date: 04/01/2018 CLINICAL DATA:  Left-sided abdominal pain with nausea and vomiting. EXAM: CT ABDOMEN AND PELVIS WITH CONTRAST TECHNIQUE: Multidetector CT imaging of the abdomen and pelvis was performed using the standard protocol following bolus administration of intravenous contrast. CONTRAST:  121m OMNIPAQUE IOHEXOL 300 MG/ML  SOLN COMPARISON:  CT scan dated 06/19/2014 FINDINGS: Lower chest: No acute abnormality. Hepatobiliary: No focal liver abnormality is seen. No gallstones, gallbladder wall thickening, or biliary dilatation. Pancreas: Unremarkable. No pancreatic ductal dilatation or surrounding inflammatory changes. Spleen: Normal in size without focal abnormality. Adrenals/Urinary Tract: Adrenal glands are unremarkable. Kidneys are normal, without renal calculi, focal lesion, or hydronephrosis. Bladder is unremarkable. Stomach/Bowel: Stomach is within normal limits. Appendix has been removed. No evidence of bowel  wall thickening, distention, or inflammatory changes. Vascular/Lymphatic: No significant vascular findings are present. No enlarged abdominal or pelvic lymph nodes. Reproductive: New 4.8 cm mass in left side of the uterus, statistically most likely a fibroid. No adnexal masses. Other: No abdominal wall hernia or abnormality. No abdominopelvic ascites. Musculoskeletal: No acute or significant osseous findings. IMPRESSION: No acute abnormalities of the abdomen or pelvis. New 4.8 cm mass in the left side of the uterus, most likely a fibroid. Electronically Signed   By: JLorriane ShireM.D.   On: 04/01/2018 14:14   [place]Abdomen Limited Ruq  Result Date: 04/01/2018 CLINICAL DATA:  Right upper quadrant pain. EXAM: ULTRASOUND ABDOMEN LIMITED RIGHT UPPER QUADRANT COMPARISON:  CT scan dated 04/01/2018 FINDINGS: Gallbladder: Multiple gallstones. No gallbladder wall thickening. Positive sonographic Murphy's sign. Common bile duct: Diameter: 4 mm, normal. Liver: No focal lesion identified. Within normal limits in parenchymal echogenicity. Portal vein is patent on color Doppler imaging with normal direction of blood flow towards the liver. IMPRESSION: Cholelithiasis. Positive sonographic  Murphy's sign. Findings suggest acute cholecystitis. Electronically Signed   By: Lorriane Shire M.D.   On: 04/01/2018 16:32        Scheduled Meds: . hydrochlorothiazide  25 mg Oral Daily  . insulin aspart  0-15 Units Subcutaneous Q4H   Continuous Infusions: . dextrose 5 % and 0.45 % NaCl with KCl 20 mEq/L 75 mL/hr at 04/01/18 1930     LOS: 1 day     Cordelia Poche, MD [facility] 04/02/2018, 8:11 AM Pager: [phone]  If 7PM-7AM, please contact night-coverage [email or web address] 04/02/2018, 8:11 AM

## 2018-04-02 NOTE — Consult Note (Addendum)
[place]  [address] [place], [place] [address] Phone: [phone] FAX: [phone]     Cynthia Rosales  26-Mar-1981 [id number]  CARE TEAM:  PCP: Medicine, [facility]  Outpatient Care Team: Patient Care Team: Medicine, [facility] as PCP - General  Inpatient Treatment Team: Treatment Team: Attending Provider: Mariel Aloe, MD; Attending Physician: Cynthia Public, MD; Registered Rosales: Cynthia Nurse, RN; Rounding Team: Cynthia Dance, MD; Registered Rosales: Cynthia Lasso, RN; Rounding Team: Cynthia Pace, Md, MD   This patient is a 37 y.o.female who presents today for surgical evaluation at the request of Cynthia Rosales.   Chief complaint / Reason for evaluation: Elevated liver function test and postprandial pain.  Suspicious for cholecystitis.  Possible hepatitis.  Morbidly obese female who had an episode of severe right upper quadrant abdominal pain.  She was at a wedding and had had a few bites of [place] food.  Normally she can eat anything she wants without difficulty.  No significant heartburn or reflux issues.  No one else was sick.  Her pain would not go away.  She came to the emergency room.  Liver function tests elevated.  No definite stones on CAT scan but confirmed by ultrasound.  Surgical consultation requested.  No personal nor family history of GI/colon cancer, inflammatory bowel disease, irritable bowel syndrome, allergy such as Celiac Sprue, dietary/dairy problems, colitis, ulcers nor gastritis.  No recent sick contacts/gastroenteritis.  No travel outside the country.  No changes in diet.  No dysphagia to solids or liquids.  No significant heartburn or reflux.  No hematochezia, hematemesis, coffee ground emesis.  No evidence of prior gastric/peptic ulceration.  Normally moves her bowels every day.    Assessment  Cynthia Rosales  37 y.o. female       Problem List:  Principal Problem:    Acute cholecystitis Active Problems:   Diabetes mellitus ([place])   Sickle cell disease without crisis ([place])   Morbid obesity with body mass index (BMI) of 50.0 to 59.9 in adult [facility])   HTN (hypertension), benign   Mass of uterus   Classic story of bili colic not going away consistent with acute cholecystitis.  Elevated liver function test suspicious for possible colitis though cholelithiasis.  Liver function test improved is a reassuring sign.  However still with persistent pain.  Plan:  -Keep n.p.o.  IV fluids.  Serial exams and laboratory values.  If liver function timers continue to improve and her pain goes down, proceed with laparoscopic cholecystectomy and cholangiogram first.  Most likely tomorrow.  If she feels worse & her liver numbers are worse, then start with GI consultation for possible MRCP tomorrow.  The anatomy & physiology of hepatobiliary & pancreatic function was discussed.  The pathophysiology of gallbladder dysfunction was discussed.  Natural history risks without surgery was discussed.   I feel the risks of no intervention will lead to serious problems that outweigh the operative risks; therefore, I recommended cholecystectomy to remove the pathology.  I explained laparoscopic techniques with possible need for an open approach.  Probable cholangiogram to evaluate the bilary tract was explained as well.    Risks such as bleeding, infection, abscess, leak, injury to other organs, need for repair of tissues / organs, need for further treatment, stroke, heart attack, death, and other risks were discussed.  I noted a good likelihood this will help address the problem.  Possibility that this will not correct all  abdominal symptoms was explained.  Goals of post-operative recovery were discussed as well.  We will work to minimize complications.  An educational handout further explaining the pathology and treatment options was given as well.  Questions were answered.  The patient  expresses understanding & wishes to proceed with surgery.    -VTE prophylaxis- SCDs, etc -mobilize as tolerated to help recovery  35 minutes spent in review, evaluation, examination, counseling, and coordination of care.  More than 50% of that time was spent in counseling.  Cynthia Rosales, M.D., F.A.C.S. Gastrointestinal and Minimally Invasive Surgery [facility] [address] [place], [place] [address] [phone] Main / Paging   04/02/2018      Past Medical History:  Diagnosis Date  . Abscess of bursa, left elbow 06/2013   Treated with I and D/antibiotics.   . Anemia   . Asthma   . Depression   . History of blood transfusion    "after I had one of my kids"  . Hypertension   . Ovarian cyst   . Renal disorder    kidney stones  . Sickle cell disease ([place])   . Type II diabetes mellitus ([place])     Past Surgical History:  Procedure Laterality Date  . APPENDECTOMY  2013  . CARPAL TUNNEL RELEASE    . CESAREAN SECTION  2000; 2007; 2011  . DILATION AND CURETTAGE OF UTERUS    . LAMINECTOMY N/A 12/13/2014   Procedure: CERVICAL LAMINECTOMY FOR INTRADURAL TUMOR;  Surgeon: Charlie Pitter, MD;  Location: [facility];  Service: Neurosurgery;  Laterality: N/A;  posterior  . LAPAROSCOPY N/A 07/20/2017   Procedure: LAPAROSCOPY OPERATIVE WITH WEDGE RESECTION RIGHT CORNUA AND PARTIAL SALPINGECTOMY;  Surgeon: [name], MD;  Location: [facility];  Service: Gynecology;  Laterality: N/A;  . REDUCTION MAMMAPLASTY Bilateral 1998    Social History   Socioeconomic History  . Marital status: Single    Spouse name: Not on file  . Number of children: 3  . Years of education: 72  . Highest education level: Not on file  Occupational History  . Occupation: Unemployed   Social Needs  . Financial resource strain: Not on file  . Food insecurity:    Worry: Not on file    Inability: Not on file  . Transportation needs:    Medical: Not on file     Non-medical: Not on file  Tobacco Use  . Smoking status: Former Smoker    Years: 0.00  . Smokeless tobacco: Never Used  . Tobacco comment: 03/27/2014 "smoked ~ 1 cigarette/day; quit in ~ 2013"  Substance and Sexual Activity  . Alcohol use: Yes    Alcohol/week: 0.0 oz  . Drug use: No  . Sexual activity: Yes    Birth control/protection: None  Lifestyle  . Physical activity:    Days per week: Not on file    Minutes per session: Not on file  . Stress: Not on file  Relationships  . Social connections:    Talks on phone: Not on file    Gets together: Not on file    Attends religious service: Not on file    Active member of club or organization: Not on file    Attends meetings of clubs or organizations: Not on file    Relationship status: Not on file  . Intimate partner violence:    Fear of current or ex partner: Not on file    Emotionally abused: Not on  file    Physically abused: Not on file    Forced sexual activity: Not on file  Other Topics Concern  . Not on file  Social History Narrative   Patient lives at home with boyfriend.    Patient has 3 children    Patient does not work    Patient has an 11th grade education    Patient is right handed     Family History  Problem Relation Age of Onset  . Diabetes Mother   . Hypertension Mother   . Breast cancer Maternal Aunt   . Ovarian cancer Maternal Grandmother   . Breast cancer Maternal Aunt   . Migraines Neg Hx     Current Facility-Administered Medications  Medication Dose Route Frequency Provider Last Rate Last Dose  . 0.9 %  sodium chloride infusion   Intravenous Continuous Michael Boston, MD      . acetaminophen (TYLENOL) suppository 650 mg  650 mg Rectal Q6H PRN Michael Boston, MD      . acetaminophen (TYLENOL) tablet 1,000 mg  1,000 mg Oral On Call to OR Michael Boston, MD      . acetaminophen (TYLENOL) tablet 325-650 mg  325-650 mg Oral Q6H PRN Michael Boston, MD      . albuterol (PROVENTIL) (2.5 MG/3ML) 0.083%  nebulizer solution 2.5 mg  2.5 mg Inhalation Q6H PRN Cynthia Public, MD      . alum & mag hydroxide-simeth (MAALOX/MYLANTA) 200-200-20 MG/5ML suspension 30 mL  30 mL Oral Q6H PRN Michael Boston, MD      . bisacodyl (DULCOLAX) suppository 10 mg  10 mg Rectal Q12H PRN Michael Boston, MD      . cefTRIAXone (ROCEPHIN) 2 g in sodium chloride 0.9 % 100 mL IVPB  2 g Intravenous Q24H Michael Boston, MD      . cefTRIAXone (ROCEPHIN) 2 g in sodium chloride 0.9 % 100 mL IVPB  2 g Intravenous On Call to OR Michael Boston, MD       And  . metroNIDAZOLE (FLAGYL) IVPB 500 mg  500 mg Intravenous On Call to OR Michael Boston, MD      . celecoxib (CELEBREX) capsule 200 mg  200 mg Oral On Call to OR Michael Boston, MD      . Chlorhexidine Gluconate Cloth 2 % PADS 6 each  6 each Topical Once Michael Boston, MD       And  . Chlorhexidine Gluconate Cloth 2 % PADS 6 each  6 each Topical Once Michael Boston, MD      . diazepam (VALIUM) tablet 5 mg  5 mg Oral Q8H PRN [place] Allan I, MD   5 mg at 04/01/18 2217  . gabapentin (NEURONTIN) capsule 300 mg  300 mg Oral On Call to OR Michael Boston, MD      . guaiFENesin-dextromethorphan (ROBITUSSIN DM) 100-10 MG/5ML syrup 10 mL  10 mL Oral Q4H PRN Michael Boston, MD      . hydrochlorothiazide (HYDRODIURIL) tablet 25 mg  25 mg Oral Daily [place] Allan I, MD   25 mg at 04/02/18 0908  . hydrocortisone (ANUSOL-HC) 2.5 % rectal cream 1 application  1 application Topical QID PRN Michael Boston, MD      . hydrocortisone cream 1 % 1 application  1 application Topical TID PRN Michael Boston, MD      . HYDROmorphone (DILAUDID) injection 0.5-2 mg  0.5-2 mg Intravenous Q2H PRN Michael Boston, MD      . ibuprofen (ADVIL,MOTRIN) tablet 400-800 mg  400-800  mg Oral Q6H PRN Michael Boston, MD      . insulin aspart (novoLOG) injection 0-15 Units  0-15 Units Subcutaneous Q4H Cynthia Aloe, MD   5 Units at 04/02/18 0820  . lactated ringers bolus 1,000 mL  1,000 mL Intravenous Once Michael Boston,  MD      . lactated ringers bolus 1,000 mL  1,000 mL Intravenous Q8H PRN Zuleika Gallus, Remo Lipps, MD      . lactated ringers bolus 1,000 mL  1,000 mL Intravenous Q8H PRN Lydell Moga, Remo Lipps, MD      . lip balm (CARMEX) ointment 1 application  1 application Topical BID Michael Boston, MD      . magic mouthwash  15 mL Oral QID PRN Michael Boston, MD      . menthol-cetylpyridinium (CEPACOL) lozenge 3 mg  1 lozenge Oral PRN Michael Boston, MD      . methocarbamol (ROBAXIN) 1,000 mg in dextrose 5 % 50 mL IVPB  1,000 mg Intravenous Q6H PRN Michael Boston, MD      . methocarbamol (ROBAXIN) tablet 1,000 mg  1,000 mg Oral Q6H PRN Michael Boston, MD      . metoCLOPramide (REGLAN) injection 10 mg  10 mg Intravenous Q6H PRN Michael Boston, MD      . metroNIDAZOLE (FLAGYL) IVPB 500 mg  500 mg Intravenous Q8H Cynthia Aloe, MD      . ondansetron [facility]) injection 4 mg  4 mg Intravenous Q6H PRN [place] Allan I, MD   4 mg at 04/01/18 2216  . oxyCODONE (Oxy IR/ROXICODONE) immediate release tablet 15 mg  15 mg Oral Q6H PRN [place] Allan I, MD   15 mg at 04/01/18 2217  . phenol (CHLORASEPTIC) mouth spray 1-2 spray  1-2 spray Mouth/Throat PRN Michael Boston, MD      . prochlorperazine (COMPAZINE) injection 5-10 mg  5-10 mg Intravenous Q4H PRN Michael Boston, MD         Allergies  Allergen Reactions  . Buprenorphine Hcl Itching and Nausea Only  . Morphine And Related Itching and Nausea Only  . Amoxicillin Hives and Rash    Has patient had a PCN reaction causing immediate rash, facial/tongue/throat swelling, SOB or lightheadedness with hypotension: Yes Has patient had a PCN reaction causing severe rash involving mucus membranes or skin necrosis: No Has patient had a PCN reaction that required hospitalization: No  Has patient had a PCN reaction occurring within the last 10 years: Yes If all of the above answers are "NO", then may proceed with Cephalosporin use.      ROS:   All other systems reviewed & are negative except  per HPI or as noted below: Constitutional:  No fevers, chills, sweats.  Weight stable Eyes:  No vision changes, No discharge HENT:  No sore throats, nasal drainage Lymph: No neck swelling, No bruising easily Pulmonary:  No cough, productive sputum CV: No orthopnea, PND  Patient walks 30 minutes for about 1 miles without difficulty.  No exertional chest/neck/shoulder/arm pain. GI: No personal nor family history of GI/colon cancer, inflammatory bowel disease, irritable bowel syndrome, allergy such as Celiac Sprue, dietary/dairy problems, colitis, ulcers nor gastritis.  No recent sick contacts/gastroenteritis.  No travel outside the country.  No changes in diet. Renal: No UTIs, No hematuria Genital:  No drainage, bleeding, masses Musculoskeletal: No severe joint pain.  Good ROM major joints Skin:  No sores or lesions.  No rashes Heme/Lymph:  No easy bleeding.  No swollen lymph nodes Neuro: No focal weakness/numbness.  No  seizures Psych: No suicidal ideation.  No hallucinations  BP 138/85 (BP Location: Right Arm)   Pulse 91   Temp 98 F (36.7 C) (Oral)   Resp 18   Ht _0  (1.626 m)   Wt (!) 138.4 kg (305 lb 1.9 oz)   LMP 06/18/2017 (Exact Date)   SpO2 99%   BMI 52.37 kg/m   Physical Exam: General: Pt awake/alert/oriented x4 in mild major acute distress Eyes: PERRL, normal EOM. Sclera nonicteric Neuro: CN II-XII intact w/o focal sensory/motor deficits. Lymph: No head/neck/groin lymphadenopathy Psych:  No delerium/psychosis/paranoia HENT: Normocephalic, Mucus membranes moist.  No thrush Neck: Supple, No tracheal deviation Chest: No pain.  Good respiratory excursion. CV:  Pulses intact.  Regular rhythm Abdomen: Soft, orbitally obese.  Nondistended.  Still very tender in right upper quadrant with Murphy sign.  Left side of the abdomen is nontender.  No incarcerated hernias. Gen:  No inguinal hernias.  No inguinal lymphadenopathy.   Ext:  SCDs BLE.  No significant edema.  No  cyanosis Skin: No petechiae / purpurea.  No major sores.  Covered in piercings and tattoos.  Good hygiene. Musculoskeletal: No severe joint pain.  Good ROM major joints   Results:   Labs: Results for orders placed or performed during the hospital encounter of 04/01/18 (from the past 48 hour(s))  CBC with Differential     Status: Abnormal   Collection Time: 04/01/18 11:29 AM  Result Value Ref Range   WBC 12.5 (H) 4.0 - 10.5 K/uL   RBC 5.17 (H) 3.87 - 5.11 MIL/uL   Hemoglobin 11.3 (L) 12.0 - 15.0 g/dL   HCT 35.3 (L) 36.0 - 46.0 %   MCV 68.3 (L) 78.0 - 100.0 fL   MCH 21.9 (L) 26.0 - 34.0 pg   MCHC 32.0 30.0 - 36.0 g/dL   RDW 17.8 (H) 11.5 - 15.5 %   Platelets 395 150 - 400 K/uL   Neutrophils Relative % 87 %   Lymphocytes Relative 10 %   Monocytes Relative 3 %   Eosinophils Relative 0 %   Basophils Relative 0 %   Neutro Abs 10.8 (H) 1.7 - 7.7 K/uL   Lymphs Abs 1.3 0.7 - 4.0 K/uL   Monocytes Absolute 0.4 0.1 - 1.0 K/uL   Eosinophils Absolute 0.0 0.0 - 0.7 K/uL   Basophils Absolute 0.0 0.0 - 0.1 K/uL   Smear Review MORPHOLOGY UNREMARKABLE     Comment: Performed at [facility], [address], [place], [place] [address]  Comprehensive metabolic panel     Status: Abnormal   Collection Time: 04/01/18 11:29 AM  Result Value Ref Range   Sodium 136 135 - 145 mmol/L   Potassium 4.0 3.5 - 5.1 mmol/L   Chloride 99 (L) 101 - 111 mmol/L   CO2 20 (L) 22 - 32 mmol/L   Glucose, Bld 268 (H) 65 - 99 mg/dL   BUN 9 6 - 20 mg/dL   Creatinine, Ser 0.97 0.44 - 1.00 mg/dL   Calcium 9.2 8.9 - 10.3 mg/dL   Total Protein 8.2 (H) 6.5 - 8.1 g/dL   Albumin 3.4 (L) 3.5 - 5.0 g/dL   AST 679 (H) 15 - 41 U/L   ALT 850 (H) 14 - 54 U/L   Alkaline Phosphatase 298 (H) 38 - 126 U/L   Total Bilirubin 2.7 (H) 0.3 - 1.2 mg/dL   GFR calc non Af Amer >60 >60 mL/min   GFR calc Af Amer >60 >60 mL/min  Comment: (NOTE) The eGFR has been calculated using the CKD EPI equation. This calculation has not been  validated in all clinical situations. eGFR's persistently <60 mL/min signify possible Chronic Kidney Disease.    Anion gap 17 (H) 5 - 15    Comment: Performed at [facility], [place] [address] [place], [place] [address]  Lipase, blood     Status: None   Collection Time: 04/01/18 11:29 AM  Result Value Ref Range   Lipase 28 11 - 51 U/L    Comment: Performed at [facility], [place] [address], [place], [place] [address]  I-Stat Troponin, ED (not at [facility])     Status: None   Collection Time: 04/01/18 11:51 AM  Result Value Ref Range   Troponin i, poc 0.00 0.00 - 0.08 ng/mL   Comment 3            Comment: Due to the release kinetics of cTnI, a negative result within the first hours of the onset of symptoms does not rule out myocardial infarction with certainty. If myocardial infarction is still suspected, repeat the test at appropriate intervals.   I-Stat beta hCG blood, ED     Status: None   Collection Time: 04/01/18 11:52 AM  Result Value Ref Range   I-stat hCG, quantitative <5.0 <5 mIU/mL   Comment 3            Comment:   GEST. AGE      CONC.  (mIU/mL)   <=1 WEEK        5 - 50     2 WEEKS       50 - 500     3 WEEKS       100 - 10,000     4 WEEKS     1,000 - 30,000        FEMALE AND NON-PREGNANT FEMALE:     LESS THAN 5 mIU/mL   I-stat Chem 8, ED     Status: Abnormal   Collection Time: 04/01/18 11:53 AM  Result Value Ref Range   Sodium 136 135 - 145 mmol/L   Potassium 4.2 3.5 - 5.1 mmol/L   Chloride 101 101 - 111 mmol/L   BUN 10 6 - 20 mg/dL   Creatinine, Ser 0.80 0.44 - 1.00 mg/dL   Glucose, Bld 270 (H) 65 - 99 mg/dL   Calcium, Ion 0.99 (L) 1.15 - 1.40 mmol/L   TCO2 23 22 - 32 mmol/L   Hemoglobin 13.6 12.0 - 15.0 g/dL   HCT 40.0 36.0 - 46.0 %  Urinalysis, Routine w reflex microscopic     Status: Abnormal   Collection Time: 04/01/18  3:48 PM  Result Value Ref Range   Color, Urine AMBER (A) YELLOW    Comment: BIOCHEMICALS MAY BE AFFECTED BY COLOR   APPearance  CLEAR CLEAR   Specific Gravity, Urine 1.039 (H) 1.005 - 1.030   pH 5.0 5.0 - 8.0   Glucose, UA >=500 (A) NEGATIVE mg/dL   Hgb urine dipstick SMALL (A) NEGATIVE   Bilirubin Urine NEGATIVE NEGATIVE   Ketones, ur 20 (A) NEGATIVE mg/dL   Protein, ur 30 (A) NEGATIVE mg/dL   Nitrite NEGATIVE NEGATIVE   Leukocytes, UA NEGATIVE NEGATIVE   RBC / HPF 0-5 0 - 5 RBC/hpf   Bacteria, UA NONE SEEN NONE SEEN   Squamous Epithelial / LPF 0-5 0 - 5    Comment: Please note change in reference range. Performed at [facility], [address] [place], [place] [address]   Urine rapid drug screen (hosp performed)     Status: Abnormal   Collection Time: 04/01/18  3:48 PM  Result Value Ref Range   Opiates POSITIVE (A) NONE DETECTED   Cocaine NONE DETECTED NONE DETECTED   Benzodiazepines POSITIVE (A) NONE DETECTED   Amphetamines NONE DETECTED NONE DETECTED   Tetrahydrocannabinol POSITIVE (A) NONE DETECTED   Barbiturates NONE DETECTED NONE DETECTED    Comment: (NOTE) DRUG SCREEN FOR MEDICAL PURPOSES ONLY.  IF CONFIRMATION IS NEEDED FOR ANY PURPOSE, NOTIFY LAB WITHIN 5 DAYS. LOWEST DETECTABLE LIMITS FOR URINE DRUG SCREEN Drug Class                     Cutoff (ng/mL) Amphetamine and metabolites    1000 Barbiturate and metabolites    200 Benzodiazepine                 638 Tricyclics and metabolites     300 Opiates and metabolites        300 Cocaine and metabolites        300 THC                            50 Performed at [facility], [place] [address] [place], [place] [address]   Acetaminophen level     Status: Abnormal   Collection Time: 04/01/18  4:43 PM  Result Value Ref Range   Acetaminophen (Tylenol), Serum <10 (L) 10 - 30 ug/mL    Comment:        THERAPEUTIC CONCENTRATIONS VARY SIGNIFICANTLY. A RANGE OF 10-30 ug/mL MAY BE AN EFFECTIVE CONCENTRATION FOR MANY PATIENTS. HOWEVER, SOME ARE BEST TREATED AT CONCENTRATIONS OUTSIDE THIS RANGE. ACETAMINOPHEN CONCENTRATIONS >150 ug/mL  AT 4 HOURS AFTER INGESTION AND >50 ug/mL AT 12 HOURS AFTER INGESTION ARE OFTEN ASSOCIATED WITH TOXIC REACTIONS. Performed at [facility], [place] [address] [place], [place] [address]   Hepatitis panel, acute     Status: None   Collection Time: 04/01/18  4:43 PM  Result Value Ref Range   Hepatitis B Surface Ag Negative Negative   HCV Ab <0.1 0.0 - 0.9 s/co ratio    Comment: (NOTE)                                  Negative:     < 0.8                             Indeterminate: 0.8 - 0.9                                  Positive:     > 0.9 The CDC recommends that a positive HCV antibody result be followed up with a HCV Nucleic Acid Amplification test (115726). Performed At: [facility] [place], [place] [id number] Rush Farmer MD UL:[id number]    Hep A IgM Negative Negative   Hep B C IgM Negative Negative    Comment: Performed at [facility], [place] [address] [place], [place] [address]  CK     Status: None   Collection Time: 04/01/18  4:43 PM  Result Value Ref Range   Total CK 171 38 - 234 U/L  Comment: SPECIMEN HEMOLYZED. HEMOLYSIS MAY AFFECT INTEGRITY OF RESULTS. Performed at [facility], [place] [address] [place], [place] [address]   D-dimer, quantitative (not at [facility])     Status: Abnormal   Collection Time: 04/01/18  7:14 PM  Result Value Ref Range   D-Dimer, Quant 1.32 (H) 0.00 - 0.50 ug/mL-FEU    Comment: (NOTE) At the manufacturer cut-off of 0.50 ug/mL FEU, this assay has been documented to exclude PE with a sensitivity and negative predictive value of 97 to 99%.  At this time, this assay has not been approved by the FDA to exclude DVT/VTE. Results should be correlated with clinical presentation. Performed at [facility], [place] [address], [place], [place] [address]   Lactate dehydrogenase     Status: Abnormal   Collection Time: 04/01/18  7:14 PM  Result Value Ref Range   LDH 594 (H) 98 - 192 U/L    Comment:  Performed at [facility], [place] [address], [place], [place] [address]  Reticulocytes     Status: None   Collection Time: 04/01/18  7:14 PM  Result Value Ref Range   Retic Ct Pct 0.9 0.4 - 3.1 %   RBC. 4.66 3.87 - 5.11 MIL/uL   Retic Count, Absolute 41.9 19.0 - 186.0 K/uL    Comment: Performed at [facility] [address] [place], [place] [address]  APTT     Status: Abnormal   Collection Time: 04/01/18  7:14 PM  Result Value Ref Range   aPTT 20 (L) 24 - 36 seconds    Comment: Performed at [place] [address] [place], [place] [address]  Protime-INR     Status: None   Collection Time: 04/01/18  7:14 PM  Result Value Ref Range   Prothrombin Time 13.7 11.4 - 15.2 seconds   INR 1.06     Comment: Performed at [facility], [place] [address], [place], [place] [address]  Magnesium     Status: Abnormal   Collection Time: 04/01/18  7:14 PM  Result Value Ref Range   Magnesium 1.6 (L) 1.7 - 2.4 mg/dL    Comment: Performed at [place] [address] [place], [place] [address]  Phosphorus     Status: None   Collection Time: 04/01/18  7:14 PM  Result Value Ref Range   Phosphorus 3.5 2.5 - 4.6 mg/dL    Comment: Performed at [facility], [place] [address] [place], [place] [address]  Glucose, capillary     Status: Abnormal   Collection Time: 04/01/18  8:28 PM  Result Value Ref Range   Glucose-Capillary 166 (H) 65 - 99 mg/dL  Glucose, capillary     Status: Abnormal   Collection Time: 04/02/18  1:06 AM  Result Value Ref Range   Glucose-Capillary 264 (H) 65 - 99 mg/dL  Glucose, capillary     Status: Abnormal   Collection Time: 04/02/18  4:21 AM  Result Value Ref Range   Glucose-Capillary 275 (H) 65 - 99 mg/dL  Comprehensive metabolic panel     Status: Abnormal   Collection Time: 04/02/18  6:31 AM  Result Value Ref Range   Sodium 132 (L) 135 - 145 mmol/L   Potassium 3.6 3.5 - 5.1 mmol/L   Chloride 97 (L) 101 - 111 mmol/L   CO2 24 22 - 32  mmol/L   Glucose, Bld 285 (H) 65 - 99 mg/dL   BUN 8 6 -  20 mg/dL   Creatinine, Ser 1.00 0.44 - 1.00 mg/dL   Calcium 8.1 (L) 8.9 - 10.3 mg/dL   Total Protein 6.6 6.5 - 8.1 g/dL   Albumin 2.6 (L) 3.5 - 5.0 g/dL   AST 340 (H) 15 - 41 U/L   ALT 604 (H) 14 - 54 U/L   Alkaline Phosphatase 255 (H) 38 - 126 U/L   Total Bilirubin 2.3 (H) 0.3 - 1.2 mg/dL   GFR calc non Af Amer >60 >60 mL/min   GFR calc Af Amer >60 >60 mL/min    Comment: (NOTE) The eGFR has been calculated using the CKD EPI equation. This calculation has not been validated in all clinical situations. eGFR's persistently <60 mL/min signify possible Chronic Kidney Disease.    Anion gap 11 5 - 15    Comment: Performed at [facility] [address] [place], [place] [address]  CBC     Status: Abnormal   Collection Time: 04/02/18  6:31 AM  Result Value Ref Range   WBC 11.3 (H) 4.0 - 10.5 K/uL   RBC 4.15 3.87 - 5.11 MIL/uL   Hemoglobin 9.0 (L) 12.0 - 15.0 g/dL    Comment: REPEATED TO VERIFY SPECIMEN CHECKED FOR CLOTS DELTA CHECK NOTED    HCT 28.2 (L) 36.0 - 46.0 %   MCV 68.0 (L) 78.0 - 100.0 fL   MCH 21.7 (L) 26.0 - 34.0 pg   MCHC 31.9 30.0 - 36.0 g/dL   RDW 18.1 (H) 11.5 - 15.5 %   Platelets 351 150 - 400 K/uL    Comment: Performed at [facility], [place] [address] [place], [place] [address]  Glucose, capillary     Status: Abnormal   Collection Time: 04/02/18  7:56 AM  Result Value Ref Range   Glucose-Capillary 239 (H) 65 - 99 mg/dL    Imaging / Studies: Ct Abdomen Pelvis W Contrast  Result Date: 04/01/2018 CLINICAL DATA:  Left-sided abdominal pain with nausea and vomiting. EXAM: CT ABDOMEN AND PELVIS WITH CONTRAST TECHNIQUE: Multidetector CT imaging of the abdomen and pelvis was performed using the standard protocol following bolus administration of intravenous contrast. CONTRAST:  147m OMNIPAQUE IOHEXOL 300 MG/ML  SOLN COMPARISON:  CT scan dated 06/19/2014 FINDINGS: Lower chest: No acute abnormality.  Hepatobiliary: No focal liver abnormality is seen. No gallstones, gallbladder wall thickening, or biliary dilatation. Pancreas: Unremarkable. No pancreatic ductal dilatation or surrounding inflammatory changes. Spleen: Normal in size without focal abnormality. Adrenals/Urinary Tract: Adrenal glands are unremarkable. Kidneys are normal, without renal calculi, focal lesion, or hydronephrosis. Bladder is unremarkable. Stomach/Bowel: Stomach is within normal limits. Appendix has been removed. No evidence of bowel wall thickening, distention, or inflammatory changes. Vascular/Lymphatic: No significant vascular findings are present. No enlarged abdominal or pelvic lymph nodes. Reproductive: New 4.8 cm mass in left side of the uterus, statistically most likely a fibroid. No adnexal masses. Other: No abdominal wall hernia or abnormality. No abdominopelvic ascites. Musculoskeletal: No acute or significant osseous findings. IMPRESSION: No acute abnormalities of the abdomen or pelvis. New 4.8 cm mass in the left side of the uterus, most likely a fibroid. Electronically Signed   By: JLorriane ShireM.D.   On: 04/01/2018 14:14   [place]domen Limited Ruq  Result Date: 04/01/2018 CLINICAL DATA:  Right upper quadrant pain. EXAM: ULTRASOUND ABDOMEN LIMITED RIGHT UPPER QUADRANT COMPARISON:  CT scan dated 04/01/2018 FINDINGS: Gallbladder: Multiple gallstones. No gallbladder wall thickening. Positive sonographic Murphy's sign. Common bile duct: Diameter: 4 mm, normal.  Liver: No focal lesion identified. Within normal limits in parenchymal echogenicity. Portal vein is patent on color Doppler imaging with normal direction of blood flow towards the liver. IMPRESSION: Cholelithiasis. Positive sonographic Murphy's sign. Findings suggest acute cholecystitis. Electronically Signed   By: Lorriane Shire M.D.   On: 04/01/2018 16:32    Medications / Allergies: per chart  Antibiotics: Anti-infectives (From admission, onward)   Start      Dose/Rate Route Frequency Ordered Stop   04/02/18 1100  ceFEPIme (MAXIPIME) 1 g in sodium chloride 0.9 % 100 mL IVPB  Status:  Discontinued     1 g 200 mL/hr over 30 Minutes Intravenous Every 8 hours 04/02/18 1000 04/02/18 1019   04/02/18 1100  metroNIDAZOLE (FLAGYL) IVPB 500 mg     500 mg 100 mL/hr over 60 Minutes Intravenous Every 8 hours 04/02/18 1000     04/02/18 1030  cefTRIAXone (ROCEPHIN) 2 g in sodium chloride 0.9 % 100 mL IVPB    Note to Pharmacy:  Pharmacy may adjust dosing strength / duration / interval for maximal efficacy   2 g 200 mL/hr over 30 Minutes Intravenous Every 24 hours 04/02/18 1019     04/02/18 1030  cefTRIAXone (ROCEPHIN) 2 g in sodium chloride 0.9 % 100 mL IVPB     2 g 200 mL/hr over 30 Minutes Intravenous On call to O.R. 04/02/18 1021 04/03/18 0559   04/02/18 1030  metroNIDAZOLE (FLAGYL) IVPB 500 mg     500 mg 100 mL/hr over 60 Minutes Intravenous On call to O.R. 04/02/18 1021 04/03/18 0559        Note: Portions of this report may have been transcribed using voice recognition software. Every effort was made to ensure accuracy; however, inadvertent computerized transcription errors may be present.   Any transcriptional errors that result from this process are unintentional.    Cynthia Rosales, M.D., F.A.C.S. [facility] [address], [place], [place] [address] [phone] Main / Paging   04/02/2018

## 2018-04-02 NOTE — H&P (View-Only) (Signed)
[facility]  [address] [place], [place] [address] Phone: [phone] FAX: [phone]     Cynthia Rosales  26-Mar-1981 [id number]  CARE TEAM:  PCP: Medicine, [facility]  Outpatient Care Team: Patient Care Team: Medicine, [facility] as PCP - General  Inpatient Treatment Team: Treatment Team: Attending Provider: Mariel Aloe, MD; Attending Physician: Bonnell Public, MD; Registered Nurse: Gracelyn Nurse, RN; Rounding Team: Fanny Dance, MD; Registered Nurse: Haywood Lasso, RN; Rounding Team: Edison Pace, Md, MD   This patient is a 37 y.o.female who presents today for surgical evaluation at the request of Dr. Lonny Prude.   Chief complaint / Reason for evaluation: Elevated liver function test and postprandial pain.  Suspicious for cholecystitis.  Possible hepatitis.  Morbidly obese female who had an episode of severe right upper quadrant abdominal pain.  She was at a wedding and had had a few bites of [place] food.  Normally she can eat anything she wants without difficulty.  No significant heartburn or reflux issues.  No one else was sick.  Her pain would not go away.  She came to the emergency room.  Liver function tests elevated.  No definite stones on CAT scan but confirmed by ultrasound.  Surgical consultation requested.  No personal nor family history of GI/colon cancer, inflammatory bowel disease, irritable bowel syndrome, allergy such as Celiac Sprue, dietary/dairy problems, colitis, ulcers nor gastritis.  No recent sick contacts/gastroenteritis.  No travel outside the country.  No changes in diet.  No dysphagia to solids or liquids.  No significant heartburn or reflux.  No hematochezia, hematemesis, coffee ground emesis.  No evidence of prior gastric/peptic ulceration.  Normally moves her bowels every day.    Assessment  Cynthia Rosales  37 y.o. female       Problem List:  Principal Problem:    Acute cholecystitis Active Problems:   Diabetes mellitus ([place])   Sickle cell disease without crisis ([place])   Morbid obesity with body mass index (BMI) of 50.0 to 59.9 in adult [facility])   HTN (hypertension), benign   Mass of uterus   Classic story of bili colic not going away consistent with acute cholecystitis.  Elevated liver function test suspicious for possible colitis though cholelithiasis.  Liver function test improved is a reassuring sign.  However still with persistent pain.  Plan:  -Keep n.p.o.  IV fluids.  Serial exams and laboratory values.  If liver function timers continue to improve and her pain goes down, proceed with laparoscopic cholecystectomy and cholangiogram first.  Most likely tomorrow.  If she feels worse & her liver numbers are worse, then start with GI consultation for possible MRCP tomorrow.  The anatomy & physiology of hepatobiliary & pancreatic function was discussed.  The pathophysiology of gallbladder dysfunction was discussed.  Natural history risks without surgery was discussed.   I feel the risks of no intervention will lead to serious problems that outweigh the operative risks; therefore, I recommended cholecystectomy to remove the pathology.  I explained laparoscopic techniques with possible need for an open approach.  Probable cholangiogram to evaluate the bilary tract was explained as well.    Risks such as bleeding, infection, abscess, leak, injury to other organs, need for repair of tissues / organs, need for further treatment, stroke, heart attack, death, and other risks were discussed.  I noted a good likelihood this will help address the problem.  Possibility that this will not correct all  abdominal symptoms was explained.  Goals of post-operative recovery were discussed as well.  We will work to minimize complications.  An educational handout further explaining the pathology and treatment options was given as well.  Questions were answered.  The patient  expresses understanding & wishes to proceed with surgery.    -VTE prophylaxis- SCDs, etc -mobilize as tolerated to help recovery  35 minutes spent in review, evaluation, examination, counseling, and coordination of care.  More than 50% of that time was spent in counseling.  Cynthia Rosales, M.D., F.A.C.S. Gastrointestinal and Minimally Invasive Surgery [facility] [address] [place], [place] [address] [phone] Main / Paging   04/02/2018      Past Medical History:  Diagnosis Date  . Abscess of bursa, left elbow 06/2013   Treated with I and D/antibiotics.   . Anemia   . Asthma   . Depression   . History of blood transfusion    "after I had one of my kids"  . Hypertension   . Ovarian cyst   . Renal disorder    kidney stones  . Sickle cell disease ([place])   . Type II diabetes mellitus ([place])     Past Surgical History:  Procedure Laterality Date  . APPENDECTOMY  2013  . CARPAL TUNNEL RELEASE    . CESAREAN SECTION  2000; 2007; 2011  . DILATION AND CURETTAGE OF UTERUS    . LAMINECTOMY N/A 12/13/2014   Procedure: CERVICAL LAMINECTOMY FOR INTRADURAL TUMOR;  Surgeon: Charlie Pitter, MD;  Location: [facility];  Service: Neurosurgery;  Laterality: N/A;  posterior  . LAPAROSCOPY N/A 07/20/2017   Procedure: LAPAROSCOPY OPERATIVE WITH WEDGE RESECTION RIGHT CORNUA AND PARTIAL SALPINGECTOMY;  Surgeon: [name], MD;  Location: [facility];  Service: Gynecology;  Laterality: N/A;  . REDUCTION MAMMAPLASTY Bilateral 1998    Social History   Socioeconomic History  . Marital status: Single    Spouse name: Not on file  . Number of children: 3  . Years of education: 72  . Highest education level: Not on file  Occupational History  . Occupation: Unemployed   Social Needs  . Financial resource strain: Not on file  . Food insecurity:    Worry: Not on file    Inability: Not on file  . Transportation needs:    Medical: Not on file     Non-medical: Not on file  Tobacco Use  . Smoking status: Former Smoker    Years: 0.00  . Smokeless tobacco: Never Used  . Tobacco comment: 03/27/2014 "smoked ~ 1 cigarette/day; quit in ~ 2013"  Substance and Sexual Activity  . Alcohol use: Yes    Alcohol/week: 0.0 oz  . Drug use: No  . Sexual activity: Yes    Birth control/protection: None  Lifestyle  . Physical activity:    Days per week: Not on file    Minutes per session: Not on file  . Stress: Not on file  Relationships  . Social connections:    Talks on phone: Not on file    Gets together: Not on file    Attends religious service: Not on file    Active member of club or organization: Not on file    Attends meetings of clubs or organizations: Not on file    Relationship status: Not on file  . Intimate partner violence:    Fear of current or ex partner: Not on file    Emotionally abused: Not on  file    Physically abused: Not on file    Forced sexual activity: Not on file  Other Topics Concern  . Not on file  Social History Narrative   Patient lives at home with boyfriend.    Patient has 3 children    Patient does not work    Patient has an 11th grade education    Patient is right handed     Family History  Problem Relation Age of Onset  . Diabetes Mother   . Hypertension Mother   . Breast cancer Maternal Aunt   . Ovarian cancer Maternal Grandmother   . Breast cancer Maternal Aunt   . Migraines Neg Hx     Current Facility-Administered Medications  Medication Dose Route Frequency Provider Last Rate Last Dose  . 0.9 %  sodium chloride infusion   Intravenous Continuous Michael Boston, MD      . acetaminophen (TYLENOL) suppository 650 mg  650 mg Rectal Q6H PRN Michael Boston, MD      . acetaminophen (TYLENOL) tablet 1,000 mg  1,000 mg Oral On Call to OR Michael Boston, MD      . acetaminophen (TYLENOL) tablet 325-650 mg  325-650 mg Oral Q6H PRN Michael Boston, MD      . albuterol (PROVENTIL) (2.5 MG/3ML) 0.083%  nebulizer solution 2.5 mg  2.5 mg Inhalation Q6H PRN Bonnell Public, MD      . alum & mag hydroxide-simeth (MAALOX/MYLANTA) 200-200-20 MG/5ML suspension 30 mL  30 mL Oral Q6H PRN Michael Boston, MD      . bisacodyl (DULCOLAX) suppository 10 mg  10 mg Rectal Q12H PRN Michael Boston, MD      . cefTRIAXone (ROCEPHIN) 2 g in sodium chloride 0.9 % 100 mL IVPB  2 g Intravenous Q24H Michael Boston, MD      . cefTRIAXone (ROCEPHIN) 2 g in sodium chloride 0.9 % 100 mL IVPB  2 g Intravenous On Call to OR Michael Boston, MD       And  . metroNIDAZOLE (FLAGYL) IVPB 500 mg  500 mg Intravenous On Call to OR Michael Boston, MD      . celecoxib (CELEBREX) capsule 200 mg  200 mg Oral On Call to OR Michael Boston, MD      . Chlorhexidine Gluconate Cloth 2 % PADS 6 each  6 each Topical Once Michael Boston, MD       And  . Chlorhexidine Gluconate Cloth 2 % PADS 6 each  6 each Topical Once Michael Boston, MD      . diazepam (VALIUM) tablet 5 mg  5 mg Oral Q8H PRN Dana Allan I, MD   5 mg at 04/01/18 2217  . gabapentin (NEURONTIN) capsule 300 mg  300 mg Oral On Call to OR Michael Boston, MD      . guaiFENesin-dextromethorphan (ROBITUSSIN DM) 100-10 MG/5ML syrup 10 mL  10 mL Oral Q4H PRN Michael Boston, MD      . hydrochlorothiazide (HYDRODIURIL) tablet 25 mg  25 mg Oral Daily Dana Allan I, MD   25 mg at 04/02/18 0908  . hydrocortisone (ANUSOL-HC) 2.5 % rectal cream 1 application  1 application Topical QID PRN Michael Boston, MD      . hydrocortisone cream 1 % 1 application  1 application Topical TID PRN Michael Boston, MD      . HYDROmorphone (DILAUDID) injection 0.5-2 mg  0.5-2 mg Intravenous Q2H PRN Michael Boston, MD      . ibuprofen (ADVIL,MOTRIN) tablet 400-800 mg  400-800  mg Oral Q6H PRN Michael Boston, MD      . insulin aspart (novoLOG) injection 0-15 Units  0-15 Units Subcutaneous Q4H Mariel Aloe, MD   5 Units at 04/02/18 0820  . lactated ringers bolus 1,000 mL  1,000 mL Intravenous Once Michael Boston,  MD      . lactated ringers bolus 1,000 mL  1,000 mL Intravenous Q8H PRN Sidharth Leverette, Remo Lipps, MD      . lactated ringers bolus 1,000 mL  1,000 mL Intravenous Q8H PRN Ambrosio Reuter, Remo Lipps, MD      . lip balm (CARMEX) ointment 1 application  1 application Topical BID Michael Boston, MD      . magic mouthwash  15 mL Oral QID PRN Michael Boston, MD      . menthol-cetylpyridinium (CEPACOL) lozenge 3 mg  1 lozenge Oral PRN Michael Boston, MD      . methocarbamol (ROBAXIN) 1,000 mg in dextrose 5 % 50 mL IVPB  1,000 mg Intravenous Q6H PRN Michael Boston, MD      . methocarbamol (ROBAXIN) tablet 1,000 mg  1,000 mg Oral Q6H PRN Michael Boston, MD      . metoCLOPramide (REGLAN) injection 10 mg  10 mg Intravenous Q6H PRN Michael Boston, MD      . metroNIDAZOLE (FLAGYL) IVPB 500 mg  500 mg Intravenous Q8H Mariel Aloe, MD      . ondansetron [facility]) injection 4 mg  4 mg Intravenous Q6H PRN Dana Allan I, MD   4 mg at 04/01/18 2216  . oxyCODONE (Oxy IR/ROXICODONE) immediate release tablet 15 mg  15 mg Oral Q6H PRN Dana Allan I, MD   15 mg at 04/01/18 2217  . phenol (CHLORASEPTIC) mouth spray 1-2 spray  1-2 spray Mouth/Throat PRN Michael Boston, MD      . prochlorperazine (COMPAZINE) injection 5-10 mg  5-10 mg Intravenous Q4H PRN Michael Boston, MD         Allergies  Allergen Reactions  . Buprenorphine Hcl Itching and Nausea Only  . Morphine And Related Itching and Nausea Only  . Amoxicillin Hives and Rash    Has patient had a PCN reaction causing immediate rash, facial/tongue/throat swelling, SOB or lightheadedness with hypotension: Yes Has patient had a PCN reaction causing severe rash involving mucus membranes or skin necrosis: No Has patient had a PCN reaction that required hospitalization: No  Has patient had a PCN reaction occurring within the last 10 years: Yes If all of the above answers are "NO", then may proceed with Cephalosporin use.      ROS:   All other systems reviewed & are negative except  per HPI or as noted below: Constitutional:  No fevers, chills, sweats.  Weight stable Eyes:  No vision changes, No discharge HENT:  No sore throats, nasal drainage Lymph: No neck swelling, No bruising easily Pulmonary:  No cough, productive sputum CV: No orthopnea, PND  Patient walks 30 minutes for about 1 miles without difficulty.  No exertional chest/neck/shoulder/arm pain. GI: No personal nor family history of GI/colon cancer, inflammatory bowel disease, irritable bowel syndrome, allergy such as Celiac Sprue, dietary/dairy problems, colitis, ulcers nor gastritis.  No recent sick contacts/gastroenteritis.  No travel outside the country.  No changes in diet. Renal: No UTIs, No hematuria Genital:  No drainage, bleeding, masses Musculoskeletal: No severe joint pain.  Good ROM major joints Skin:  No sores or lesions.  No rashes Heme/Lymph:  No easy bleeding.  No swollen lymph nodes Neuro: No focal weakness/numbness.  No  seizures Psych: No suicidal ideation.  No hallucinations  BP 138/85 (BP Location: Right Arm)   Pulse 91   Temp 98 F (36.7 C) (Oral)   Resp 18   Ht _0  (1.626 m)   Wt (!) 138.4 kg (305 lb 1.9 oz)   LMP 06/18/2017 (Exact Date)   SpO2 99%   BMI 52.37 kg/m   Physical Exam: General: Pt awake/alert/oriented x4 in mild major acute distress Eyes: PERRL, normal EOM. Sclera nonicteric Neuro: CN II-XII intact w/o focal sensory/motor deficits. Lymph: No head/neck/groin lymphadenopathy Psych:  No delerium/psychosis/paranoia HENT: Normocephalic, Mucus membranes moist.  No thrush Neck: Supple, No tracheal deviation Chest: No pain.  Good respiratory excursion. CV:  Pulses intact.  Regular rhythm Abdomen: Soft, orbitally obese.  Nondistended.  Still very tender in right upper quadrant with Murphy sign.  Left side of the abdomen is nontender.  No incarcerated hernias. Gen:  No inguinal hernias.  No inguinal lymphadenopathy.   Ext:  SCDs BLE.  No significant edema.  No  cyanosis Skin: No petechiae / purpurea.  No major sores.  Covered in piercings and tattoos.  Good hygiene. Musculoskeletal: No severe joint pain.  Good ROM major joints   Results:   Labs: Results for orders placed or performed during the hospital encounter of 04/01/18 (from the past 48 hour(s))  CBC with Differential     Status: Abnormal   Collection Time: 04/01/18 11:29 AM  Result Value Ref Range   WBC 12.5 (H) 4.0 - 10.5 K/uL   RBC 5.17 (H) 3.87 - 5.11 MIL/uL   Hemoglobin 11.3 (L) 12.0 - 15.0 g/dL   HCT 35.3 (L) 36.0 - 46.0 %   MCV 68.3 (L) 78.0 - 100.0 fL   MCH 21.9 (L) 26.0 - 34.0 pg   MCHC 32.0 30.0 - 36.0 g/dL   RDW 17.8 (H) 11.5 - 15.5 %   Platelets 395 150 - 400 K/uL   Neutrophils Relative % 87 %   Lymphocytes Relative 10 %   Monocytes Relative 3 %   Eosinophils Relative 0 %   Basophils Relative 0 %   Neutro Abs 10.8 (H) 1.7 - 7.7 K/uL   Lymphs Abs 1.3 0.7 - 4.0 K/uL   Monocytes Absolute 0.4 0.1 - 1.0 K/uL   Eosinophils Absolute 0.0 0.0 - 0.7 K/uL   Basophils Absolute 0.0 0.0 - 0.1 K/uL   Smear Review MORPHOLOGY UNREMARKABLE     Comment: Performed at [facility], [address], [place], [place] [address]  Comprehensive metabolic panel     Status: Abnormal   Collection Time: 04/01/18 11:29 AM  Result Value Ref Range   Sodium 136 135 - 145 mmol/L   Potassium 4.0 3.5 - 5.1 mmol/L   Chloride 99 (L) 101 - 111 mmol/L   CO2 20 (L) 22 - 32 mmol/L   Glucose, Bld 268 (H) 65 - 99 mg/dL   BUN 9 6 - 20 mg/dL   Creatinine, Ser 0.97 0.44 - 1.00 mg/dL   Calcium 9.2 8.9 - 10.3 mg/dL   Total Protein 8.2 (H) 6.5 - 8.1 g/dL   Albumin 3.4 (L) 3.5 - 5.0 g/dL   AST 679 (H) 15 - 41 U/L   ALT 850 (H) 14 - 54 U/L   Alkaline Phosphatase 298 (H) 38 - 126 U/L   Total Bilirubin 2.7 (H) 0.3 - 1.2 mg/dL   GFR calc non Af Amer >60 >60 mL/min   GFR calc Af Amer >60 >60 mL/min  Comment: (NOTE) The eGFR has been calculated using the CKD EPI equation. This calculation has not been  validated in all clinical situations. eGFR's persistently <60 mL/min signify possible Chronic Kidney Disease.    Anion gap 17 (H) 5 - 15    Comment: Performed at [facility], [place] [address] [place], [place] [address]  Lipase, blood     Status: None   Collection Time: 04/01/18 11:29 AM  Result Value Ref Range   Lipase 28 11 - 51 U/L    Comment: Performed at [facility], [place] [address] [place], [place] [address]  I-Stat Troponin, ED (not at [facility])     Status: None   Collection Time: 04/01/18 11:51 AM  Result Value Ref Range   Troponin i, poc 0.00 0.00 - 0.08 ng/mL   Comment 3            Comment: Due to the release kinetics of cTnI, a negative result within the first hours of the onset of symptoms does not rule out myocardial infarction with certainty. If myocardial infarction is still suspected, repeat the test at appropriate intervals.   I-Stat beta hCG blood, ED     Status: None   Collection Time: 04/01/18 11:52 AM  Result Value Ref Range   I-stat hCG, quantitative <5.0 <5 mIU/mL   Comment 3            Comment:   GEST. AGE      CONC.  (mIU/mL)   <=1 WEEK        5 - 50     2 WEEKS       50 - 500     3 WEEKS       100 - 10,000     4 WEEKS     1,000 - 30,000        FEMALE AND NON-PREGNANT FEMALE:     LESS THAN 5 mIU/mL   I-stat Chem 8, ED     Status: Abnormal   Collection Time: 04/01/18 11:53 AM  Result Value Ref Range   Sodium 136 135 - 145 mmol/L   Potassium 4.2 3.5 - 5.1 mmol/L   Chloride 101 101 - 111 mmol/L   BUN 10 6 - 20 mg/dL   Creatinine, Ser 0.80 0.44 - 1.00 mg/dL   Glucose, Bld 270 (H) 65 - 99 mg/dL   Calcium, Ion 0.99 (L) 1.15 - 1.40 mmol/L   TCO2 23 22 - 32 mmol/L   Hemoglobin 13.6 12.0 - 15.0 g/dL   HCT 40.0 36.0 - 46.0 %  Urinalysis, Routine w reflex microscopic     Status: Abnormal   Collection Time: 04/01/18  3:48 PM  Result Value Ref Range   Color, Urine AMBER (A) YELLOW    Comment: BIOCHEMICALS MAY BE AFFECTED BY COLOR   APPearance  CLEAR CLEAR   Specific Gravity, Urine 1.039 (H) 1.005 - 1.030   pH 5.0 5.0 - 8.0   Glucose, UA >=500 (A) NEGATIVE mg/dL   Hgb urine dipstick SMALL (A) NEGATIVE   Bilirubin Urine NEGATIVE NEGATIVE   Ketones, ur 20 (A) NEGATIVE mg/dL   Protein, ur 30 (A) NEGATIVE mg/dL   Nitrite NEGATIVE NEGATIVE   Leukocytes, UA NEGATIVE NEGATIVE   RBC / HPF 0-5 0 - 5 RBC/hpf   Bacteria, UA NONE SEEN NONE SEEN   Squamous Epithelial / LPF 0-5 0 - 5    Comment: Please note change in reference range. Performed at [facility], [address] [place], [place] [address]   Urine rapid drug screen (hosp performed)     Status: Abnormal   Collection Time: 04/01/18  3:48 PM  Result Value Ref Range   Opiates POSITIVE (A) NONE DETECTED   Cocaine NONE DETECTED NONE DETECTED   Benzodiazepines POSITIVE (A) NONE DETECTED   Amphetamines NONE DETECTED NONE DETECTED   Tetrahydrocannabinol POSITIVE (A) NONE DETECTED   Barbiturates NONE DETECTED NONE DETECTED    Comment: (NOTE) DRUG SCREEN FOR MEDICAL PURPOSES ONLY.  IF CONFIRMATION IS NEEDED FOR ANY PURPOSE, NOTIFY LAB WITHIN 5 DAYS. LOWEST DETECTABLE LIMITS FOR URINE DRUG SCREEN Drug Class                     Cutoff (ng/mL) Amphetamine and metabolites    1000 Barbiturate and metabolites    200 Benzodiazepine                 694 Tricyclics and metabolites     300 Opiates and metabolites        300 Cocaine and metabolites        300 THC                            50 Performed at [facility], [place] [address] [place], [place] [address]   Acetaminophen level     Status: Abnormal   Collection Time: 04/01/18  4:43 PM  Result Value Ref Range   Acetaminophen (Tylenol), Serum <10 (L) 10 - 30 ug/mL    Comment:        THERAPEUTIC CONCENTRATIONS VARY SIGNIFICANTLY. A RANGE OF 10-30 ug/mL MAY BE AN EFFECTIVE CONCENTRATION FOR MANY PATIENTS. HOWEVER, SOME ARE BEST TREATED AT CONCENTRATIONS OUTSIDE THIS RANGE. ACETAMINOPHEN CONCENTRATIONS >150 ug/mL  AT 4 HOURS AFTER INGESTION AND >50 ug/mL AT 12 HOURS AFTER INGESTION ARE OFTEN ASSOCIATED WITH TOXIC REACTIONS. Performed at [facility], [place] [address], [place], [place] [address]   Hepatitis panel, acute     Status: None   Collection Time: 04/01/18  4:43 PM  Result Value Ref Range   Hepatitis B Surface Ag Negative Negative   HCV Ab <0.1 0.0 - 0.9 s/co ratio    Comment: (NOTE)                                  Negative:     < 0.8                             Indeterminate: 0.8 - 0.9                                  Positive:     > 0.9 The CDC recommends that a positive HCV antibody result be followed up with a HCV Nucleic Acid Amplification test (093818). Performed At: [facility] [place], [place] [id number] Rush Farmer MD VE:[id number]    Hep A IgM Negative Negative   Hep B C IgM Negative Negative    Comment: Performed at [facility], [place] [address] [place], [place] [address]  CK     Status: None   Collection Time: 04/01/18  4:43 PM  Result Value Ref Range   Total CK 171 38 - 234 U/L  Comment: SPECIMEN HEMOLYZED. HEMOLYSIS MAY AFFECT INTEGRITY OF RESULTS. Performed at [facility], [place] [address] [place], [place] [address]   D-dimer, quantitative (not at [facility])     Status: Abnormal   Collection Time: 04/01/18  7:14 PM  Result Value Ref Range   D-Dimer, Quant 1.32 (H) 0.00 - 0.50 ug/mL-FEU    Comment: (NOTE) At the manufacturer cut-off of 0.50 ug/mL FEU, this assay has been documented to exclude PE with a sensitivity and negative predictive value of 97 to 99%.  At this time, this assay has not been approved by the FDA to exclude DVT/VTE. Results should be correlated with clinical presentation. Performed at [facility], [place] [address], [place], [place] [address]   Lactate dehydrogenase     Status: Abnormal   Collection Time: 04/01/18  7:14 PM  Result Value Ref Range   LDH 594 (H) 98 - 192 U/L    Comment:  Performed at [facility], [place] [address], [place], [place] [address]  Reticulocytes     Status: None   Collection Time: 04/01/18  7:14 PM  Result Value Ref Range   Retic Ct Pct 0.9 0.4 - 3.1 %   RBC. 4.66 3.87 - 5.11 MIL/uL   Retic Count, Absolute 41.9 19.0 - 186.0 K/uL    Comment: Performed at [facility] [address] [place], [place] [address]  APTT     Status: Abnormal   Collection Time: 04/01/18  7:14 PM  Result Value Ref Range   aPTT 20 (L) 24 - 36 seconds    Comment: Performed at [place] [address] [place], [place] [address]  Protime-INR     Status: None   Collection Time: 04/01/18  7:14 PM  Result Value Ref Range   Prothrombin Time 13.7 11.4 - 15.2 seconds   INR 1.06     Comment: Performed at [facility], [place] [address], [place], [place] [address]  Magnesium     Status: Abnormal   Collection Time: 04/01/18  7:14 PM  Result Value Ref Range   Magnesium 1.6 (L) 1.7 - 2.4 mg/dL    Comment: Performed at [place] [address] [place], [place] [address]  Phosphorus     Status: None   Collection Time: 04/01/18  7:14 PM  Result Value Ref Range   Phosphorus 3.5 2.5 - 4.6 mg/dL    Comment: Performed at [facility], [place] [address] [place], [place] [address]  Glucose, capillary     Status: Abnormal   Collection Time: 04/01/18  8:28 PM  Result Value Ref Range   Glucose-Capillary 166 (H) 65 - 99 mg/dL  Glucose, capillary     Status: Abnormal   Collection Time: 04/02/18  1:06 AM  Result Value Ref Range   Glucose-Capillary 264 (H) 65 - 99 mg/dL  Glucose, capillary     Status: Abnormal   Collection Time: 04/02/18  4:21 AM  Result Value Ref Range   Glucose-Capillary 275 (H) 65 - 99 mg/dL  Comprehensive metabolic panel     Status: Abnormal   Collection Time: 04/02/18  6:31 AM  Result Value Ref Range   Sodium 132 (L) 135 - 145 mmol/L   Potassium 3.6 3.5 - 5.1 mmol/L   Chloride 97 (L) 101 - 111 mmol/L   CO2 24 22 - 32  mmol/L   Glucose, Bld 285 (H) 65 - 99 mg/dL   BUN 8 6 -  20 mg/dL   Creatinine, Ser 1.00 0.44 - 1.00 mg/dL   Calcium 8.1 (L) 8.9 - 10.3 mg/dL   Total Protein 6.6 6.5 - 8.1 g/dL   Albumin 2.6 (L) 3.5 - 5.0 g/dL   AST 340 (H) 15 - 41 U/L   ALT 604 (H) 14 - 54 U/L   Alkaline Phosphatase 255 (H) 38 - 126 U/L   Total Bilirubin 2.3 (H) 0.3 - 1.2 mg/dL   GFR calc non Af Amer >60 >60 mL/min   GFR calc Af Amer >60 >60 mL/min    Comment: (NOTE) The eGFR has been calculated using the CKD EPI equation. This calculation has not been validated in all clinical situations. eGFR's persistently <60 mL/min signify possible Chronic Kidney Disease.    Anion gap 11 5 - 15    Comment: Performed at [facility] [address] [place], [place] [address]  CBC     Status: Abnormal   Collection Time: 04/02/18  6:31 AM  Result Value Ref Range   WBC 11.3 (H) 4.0 - 10.5 K/uL   RBC 4.15 3.87 - 5.11 MIL/uL   Hemoglobin 9.0 (L) 12.0 - 15.0 g/dL    Comment: REPEATED TO VERIFY SPECIMEN CHECKED FOR CLOTS DELTA CHECK NOTED    HCT 28.2 (L) 36.0 - 46.0 %   MCV 68.0 (L) 78.0 - 100.0 fL   MCH 21.7 (L) 26.0 - 34.0 pg   MCHC 31.9 30.0 - 36.0 g/dL   RDW 18.1 (H) 11.5 - 15.5 %   Platelets 351 150 - 400 K/uL    Comment: Performed at [facility], [place] [address] [place], [place] [address]  Glucose, capillary     Status: Abnormal   Collection Time: 04/02/18  7:56 AM  Result Value Ref Range   Glucose-Capillary 239 (H) 65 - 99 mg/dL    Imaging / Studies: Ct Abdomen Pelvis W Contrast  Result Date: 04/01/2018 CLINICAL DATA:  Left-sided abdominal pain with nausea and vomiting. EXAM: CT ABDOMEN AND PELVIS WITH CONTRAST TECHNIQUE: Multidetector CT imaging of the abdomen and pelvis was performed using the standard protocol following bolus administration of intravenous contrast. CONTRAST:  147m OMNIPAQUE IOHEXOL 300 MG/ML  SOLN COMPARISON:  CT scan dated 06/19/2014 FINDINGS: Lower chest: No acute abnormality.  Hepatobiliary: No focal liver abnormality is seen. No gallstones, gallbladder wall thickening, or biliary dilatation. Pancreas: Unremarkable. No pancreatic ductal dilatation or surrounding inflammatory changes. Spleen: Normal in size without focal abnormality. Adrenals/Urinary Tract: Adrenal glands are unremarkable. Kidneys are normal, without renal calculi, focal lesion, or hydronephrosis. Bladder is unremarkable. Stomach/Bowel: Stomach is within normal limits. Appendix has been removed. No evidence of bowel wall thickening, distention, or inflammatory changes. Vascular/Lymphatic: No significant vascular findings are present. No enlarged abdominal or pelvic lymph nodes. Reproductive: New 4.8 cm mass in left side of the uterus, statistically most likely a fibroid. No adnexal masses. Other: No abdominal wall hernia or abnormality. No abdominopelvic ascites. Musculoskeletal: No acute or significant osseous findings. IMPRESSION: No acute abnormalities of the abdomen or pelvis. New 4.8 cm mass in the left side of the uterus, most likely a fibroid. Electronically Signed   By: JLorriane ShireM.D.   On: 04/01/2018 14:14   UK[place]Abdomen Limited Ruq  Result Date: 04/01/2018 CLINICAL DATA:  Right upper quadrant pain. EXAM: ULTRASOUND ABDOMEN LIMITED RIGHT UPPER QUADRANT COMPARISON:  CT scan dated 04/01/2018 FINDINGS: Gallbladder: Multiple gallstones. No gallbladder wall thickening. Positive sonographic Murphy's sign. Common bile duct: Diameter: 4 mm, normal.  Liver: No focal lesion identified. Within normal limits in parenchymal echogenicity. Portal vein is patent on color Doppler imaging with normal direction of blood flow towards the liver. IMPRESSION: Cholelithiasis. Positive sonographic Murphy's sign. Findings suggest acute cholecystitis. Electronically Signed   By: Lorriane Shire M.D.   On: 04/01/2018 16:32    Medications / Allergies: per chart  Antibiotics: Anti-infectives (From admission, onward)   Start      Dose/Rate Route Frequency Ordered Stop   04/02/18 1100  ceFEPIme (MAXIPIME) 1 g in sodium chloride 0.9 % 100 mL IVPB  Status:  Discontinued     1 g 200 mL/hr over 30 Minutes Intravenous Every 8 hours 04/02/18 1000 04/02/18 1019   04/02/18 1100  metroNIDAZOLE (FLAGYL) IVPB 500 mg     500 mg 100 mL/hr over 60 Minutes Intravenous Every 8 hours 04/02/18 1000     04/02/18 1030  cefTRIAXone (ROCEPHIN) 2 g in sodium chloride 0.9 % 100 mL IVPB    Note to Pharmacy:  Pharmacy may adjust dosing strength / duration / interval for maximal efficacy   2 g 200 mL/hr over 30 Minutes Intravenous Every 24 hours 04/02/18 1019     04/02/18 1030  cefTRIAXone (ROCEPHIN) 2 g in sodium chloride 0.9 % 100 mL IVPB     2 g 200 mL/hr over 30 Minutes Intravenous On call to O.R. 04/02/18 1021 04/03/18 0559   04/02/18 1030  metroNIDAZOLE (FLAGYL) IVPB 500 mg     500 mg 100 mL/hr over 60 Minutes Intravenous On call to O.R. 04/02/18 1021 04/03/18 0559        Note: Portions of this report may have been transcribed using voice recognition software. Every effort was made to ensure accuracy; however, inadvertent computerized transcription errors may be present.   Any transcriptional errors that result from this process are unintentional.    Cynthia Rosales, M.D., F.A.C.S. Gastrointestinal and Minimally Invasive Surgery [facility] [address], [place], [place] [address] [phone] Main / Paging   04/02/2018

## 2018-04-03 ENCOUNTER — Encounter (HOSPITAL_COMMUNITY)
Admission: EM | Disposition: A | Discharge status: Home Health | Payer: Self-pay | Source: Home / Self Care | Attending: Family Medicine

## 2018-04-03 ENCOUNTER — Encounter (HOSPITAL_COMMUNITY): Payer: Self-pay | Admitting: Critical Care Medicine

## 2018-04-03 ENCOUNTER — Inpatient Hospital Stay (HOSPITAL_COMMUNITY): Payer: Medicare Other | Admitting: Critical Care Medicine

## 2018-04-03 DIAGNOSIS — R740 Nonspecific elevation of levels of transaminase and lactic acid dehydrogenase [LDH]: Secondary | ICD-10-CM

## 2018-04-03 DIAGNOSIS — N859 Noninflammatory disorder of uterus, unspecified: Secondary | ICD-10-CM

## 2018-04-03 HISTORY — PX: CHOLECYSTECTOMY: SHX55

## 2018-04-03 LAB — COMPREHENSIVE METABOLIC PANEL
ALT: 460 U/L — ABNORMAL HIGH (ref 14–54)
AST: 155 U/L — ABNORMAL HIGH (ref 15–41)
Albumin: 2.7 g/dL — ABNORMAL LOW (ref 3.5–5.0)
Alkaline Phosphatase: 238 U/L — ABNORMAL HIGH (ref 38–126)
Anion gap: 11 (ref 5–15)
BUN: <5 mg/dL — ABNORMAL LOW (ref 6–20)
CO2: 25 mmol/L (ref 22–32)
Calcium: 8.2 mg/dL — ABNORMAL LOW (ref 8.9–10.3)
Chloride: 100 mmol/L — ABNORMAL LOW (ref 101–111)
Creatinine, Ser: 0.81 mg/dL (ref 0.44–1.00)
GFR calc Af Amer: >60 mL/min (ref >60)
GFR calc non Af Amer: >60 mL/min (ref >60)
Glucose, Bld: 135 mg/dL — ABNORMAL HIGH (ref 65–99)
Potassium: 3.5 mmol/L (ref 3.5–5.1)
Sodium: 136 mmol/L (ref 135–145)
Total Bilirubin: 1.1 mg/dL (ref 0.3–1.2)
Total Protein: 6.7 g/dL (ref 6.5–8.1)

## 2018-04-03 LAB — GLUCOSE, CAPILLARY
Glucose-Capillary: 132 mg/dL — ABNORMAL HIGH (ref 65–99)
Glucose-Capillary: 148 mg/dL — ABNORMAL HIGH (ref 65–99)
Glucose-Capillary: 169 mg/dL — ABNORMAL HIGH (ref 65–99)
Glucose-Capillary: 187 mg/dL — ABNORMAL HIGH (ref 65–99)
Glucose-Capillary: 203 mg/dL — ABNORMAL HIGH (ref 65–99)
Glucose-Capillary: 223 mg/dL — ABNORMAL HIGH (ref 65–99)

## 2018-04-03 LAB — CBC
HCT: 30.6 % — ABNORMAL LOW (ref 36.0–46.0)
Hemoglobin: 9.6 g/dL — ABNORMAL LOW (ref 12.0–15.0)
MCH: 21.3 pg — ABNORMAL LOW (ref 26.0–34.0)
MCHC: 31.4 g/dL (ref 30.0–36.0)
MCV: 68 fL — ABNORMAL LOW (ref 78.0–100.0)
Platelets: 386 10*3/uL (ref 150–400)
RBC: 4.5 MIL/uL (ref 3.87–5.11)
RDW: 17.8 % — ABNORMAL HIGH (ref 11.5–15.5)
WBC: 10.3 10*3/uL (ref 4.0–10.5)

## 2018-04-03 LAB — MAGNESIUM: Magnesium: 1.5 mg/dL — ABNORMAL LOW (ref 1.7–2.4)

## 2018-04-03 LAB — LIPASE, BLOOD: Lipase: 24 U/L (ref 11–51)

## 2018-04-03 SURGERY — LAPAROSCOPIC CHOLECYSTECTOMY
Anesthesia: General | Site: Abdomen

## 2018-04-03 MED ORDER — FENTANYL CITRATE (PF) 250 MCG/5ML IJ SOLN
INTRAMUSCULAR | Status: AC
  Filled 2018-04-03: qty 5

## 2018-04-03 MED ORDER — BUPIVACAINE-EPINEPHRINE 0.25% -1:200000 IJ SOLN
INTRAMUSCULAR | Status: DC | PRN
  Administered 2018-04-03: 17 mL

## 2018-04-03 MED ORDER — HEMOSTATIC AGENTS (NO CHARGE) OPTIME
TOPICAL | Status: DC | PRN
  Administered 2018-04-03 (×2): 1 via TOPICAL

## 2018-04-03 MED ORDER — PROPOFOL 10 MG/ML IV BOLUS
INTRAVENOUS | Status: AC
  Filled 2018-04-03: qty 20

## 2018-04-03 MED ORDER — PROMETHAZINE HCL 25 MG/ML IJ SOLN: 6.2500 mg | INTRAMUSCULAR | Status: DC | PRN

## 2018-04-03 MED ORDER — SODIUM CHLORIDE 0.9 % IR SOLN
Status: DC | PRN
  Administered 2018-04-03 (×3): 1000 mL

## 2018-04-03 MED ORDER — LIDOCAINE 2% (20 MG/ML) 5 ML SYRINGE
INTRAMUSCULAR | Status: DC | PRN
  Administered 2018-04-03: 50 mg via INTRAVENOUS

## 2018-04-03 MED ORDER — SCOPOLAMINE 1 MG/3DAYS TD PT72
MEDICATED_PATCH | TRANSDERMAL | Status: DC | PRN
  Administered 2018-04-03: 1 via TRANSDERMAL

## 2018-04-03 MED ORDER — SUGAMMADEX SODIUM 500 MG/5ML IV SOLN
INTRAVENOUS | Status: AC
  Filled 2018-04-03: qty 5

## 2018-04-03 MED ORDER — ONDANSETRON HCL 4 MG/2ML IJ SOLN
INTRAMUSCULAR | Status: DC | PRN
Start: 2018-04-03 — End: 2018-04-03
  Administered 2018-04-03: 4 mg via INTRAVENOUS

## 2018-04-03 MED ORDER — MEPERIDINE HCL 50 MG/ML IJ SOLN: 6.2500 mg | INTRAMUSCULAR | Status: DC | PRN

## 2018-04-03 MED ORDER — OXYCODONE HCL 5 MG/5ML PO SOLN: 5.0000 mg | Freq: Once | ORAL | Status: AC | PRN

## 2018-04-03 MED ORDER — ONDANSETRON HCL 4 MG/2ML IJ SOLN
INTRAMUSCULAR | Status: AC
  Filled 2018-04-03: qty 2

## 2018-04-03 MED ORDER — MIDAZOLAM HCL 2 MG/2ML IJ SOLN
INTRAMUSCULAR | Status: AC
  Filled 2018-04-03: qty 2

## 2018-04-03 MED ORDER — FENTANYL CITRATE (PF) 250 MCG/5ML IJ SOLN
INTRAMUSCULAR | Status: DC | PRN
  Administered 2018-04-03: 25 ug via INTRAVENOUS
  Administered 2018-04-03: 50 ug via INTRAVENOUS
  Administered 2018-04-03: 25 ug via INTRAVENOUS
  Administered 2018-04-03: 50 ug via INTRAVENOUS
  Administered 2018-04-03: 25 ug via INTRAVENOUS
  Administered 2018-04-03 (×2): 50 ug via INTRAVENOUS
  Administered 2018-04-03 (×3): 25 ug via INTRAVENOUS
  Administered 2018-04-03 (×2): 50 ug via INTRAVENOUS

## 2018-04-03 MED ORDER — 0.9 % SODIUM CHLORIDE (POUR BTL) OPTIME
TOPICAL | Status: DC | PRN
  Administered 2018-04-03: 1000 mL

## 2018-04-03 MED ORDER — BUPIVACAINE-EPINEPHRINE (PF) 0.25% -1:200000 IJ SOLN
INTRAMUSCULAR | Status: AC
  Filled 2018-04-03: qty 30

## 2018-04-03 MED ORDER — ROCURONIUM BROMIDE 10 MG/ML (PF) SYRINGE
PREFILLED_SYRINGE | INTRAVENOUS | Status: DC | PRN
  Administered 2018-04-03: 50 mg via INTRAVENOUS
  Administered 2018-04-03: 5 mg via INTRAVENOUS
  Administered 2018-04-03: 10 mg via INTRAVENOUS

## 2018-04-03 MED ORDER — LACTATED RINGERS IV SOLN
INTRAVENOUS | Status: DC | PRN
  Administered 2018-04-03 (×2): via INTRAVENOUS

## 2018-04-03 MED ORDER — LIDOCAINE 2% (20 MG/ML) 5 ML SYRINGE
INTRAMUSCULAR | Status: AC
  Filled 2018-04-03: qty 5

## 2018-04-03 MED ORDER — INSULIN ASPART 100 UNIT/ML ~~LOC~~ SOLN
SUBCUTANEOUS | Status: AC
  Filled 2018-04-03: qty 1

## 2018-04-03 MED ORDER — OXYCODONE HCL 5 MG PO TABS
5.0000 mg | ORAL_TABLET | Freq: Once | ORAL | Status: AC | PRN
  Administered 2018-04-03: 5 mg via ORAL

## 2018-04-03 MED ORDER — ACETAMINOPHEN 10 MG/ML IV SOLN
INTRAVENOUS | Status: AC
  Filled 2018-04-03: qty 100

## 2018-04-03 MED ORDER — DEXAMETHASONE SODIUM PHOSPHATE 10 MG/ML IJ SOLN
INTRAMUSCULAR | Status: DC | PRN
  Administered 2018-04-03: 4 mg via INTRAVENOUS

## 2018-04-03 MED ORDER — LACTATED RINGERS IV SOLN: INTRAVENOUS | Status: DC

## 2018-04-03 MED ORDER — HYDROMORPHONE HCL 2 MG/ML IJ SOLN
0.3000 mg | INTRAMUSCULAR | Status: DC | PRN
  Administered 2018-04-03 (×4): 0.5 mg via INTRAVENOUS

## 2018-04-03 MED ORDER — HYDROMORPHONE HCL 2 MG/ML IJ SOLN
INTRAMUSCULAR | Status: AC
  Filled 2018-04-03: qty 1

## 2018-04-03 MED ORDER — SUGAMMADEX SODIUM 200 MG/2ML IV SOLN
INTRAVENOUS | Status: DC | PRN
  Administered 2018-04-03: 280 mg via INTRAVENOUS

## 2018-04-03 MED ORDER — OXYCODONE HCL 5 MG PO TABS: 5.0000 mg | ORAL_TABLET | Freq: Four times a day (QID) | ORAL | Status: DC | PRN

## 2018-04-03 MED ORDER — PROPOFOL 10 MG/ML IV BOLUS
INTRAVENOUS | Status: DC | PRN
  Administered 2018-04-03: 20 mg via INTRAVENOUS
  Administered 2018-04-03: 160 mg via INTRAVENOUS

## 2018-04-03 MED ORDER — OXYCODONE HCL 5 MG PO TABS
ORAL_TABLET | ORAL | Status: AC
  Filled 2018-04-03: qty 1

## 2018-04-03 MED ORDER — DEXAMETHASONE SODIUM PHOSPHATE 10 MG/ML IJ SOLN
INTRAMUSCULAR | Status: AC
  Filled 2018-04-03: qty 1

## 2018-04-03 MED ORDER — MIDAZOLAM HCL 5 MG/5ML IJ SOLN
INTRAMUSCULAR | Status: DC | PRN
  Administered 2018-04-03: 2 mg via INTRAVENOUS

## 2018-04-03 MED ORDER — SODIUM CHLORIDE 0.9 % IV SOLN: 2.0000 g | INTRAVENOUS | Status: DC

## 2018-04-03 MED ORDER — ACETAMINOPHEN 10 MG/ML IV SOLN
1000.0000 mg | Freq: Once | INTRAVENOUS | Status: AC
Start: 2018-04-03 — End: 2018-04-03
  Administered 2018-04-03: 1000 mg via INTRAVENOUS

## 2018-04-03 MED ORDER — IOPAMIDOL (ISOVUE-300) INJECTION 61%
INTRAVENOUS | Status: AC
  Filled 2018-04-03: qty 50

## 2018-04-03 SURGICAL SUPPLY — 51 items
APPLIER CLIP 5 13 M/L LIGAMAX5 (MISCELLANEOUS) ×6
BIOPATCH RED 1 DISK 7.0 (GAUZE/BANDAGES/DRESSINGS) ×2 IMPLANT
CANISTER SUCT 3000ML PPV (MISCELLANEOUS) ×3 IMPLANT
CHLORAPREP W/TINT 26ML (MISCELLANEOUS) ×3 IMPLANT
CLIP APPLIE 5 13 M/L LIGAMAX5 (MISCELLANEOUS) ×3 IMPLANT
COVER MAYO STAND STRL (DRAPES) ×1 IMPLANT
COVER SURGICAL LIGHT HANDLE (MISCELLANEOUS) ×3 IMPLANT
CUTTER FLEX LINEAR 45M (STAPLE) ×2 IMPLANT
DERMABOND ADVANCED (GAUZE/BANDAGES/DRESSINGS) ×1
DERMABOND ADVANCED .7 DNX12 (GAUZE/BANDAGES/DRESSINGS) ×2 IMPLANT
DEVICE TROCAR PUNCTURE CLOSURE (ENDOMECHANICALS) ×2 IMPLANT
DRAIN CHANNEL 19F RND (DRAIN) ×2 IMPLANT
DRAPE C-ARM 42X72 X-RAY (DRAPES) ×1 IMPLANT
DRSG TEGADERM 2-3/8X2-3/4 SM (GAUZE/BANDAGES/DRESSINGS) ×2 IMPLANT
ELECT REM PT RETURN 9FT ADLT (ELECTROSURGICAL) ×3
ELECTRODE REM PT RTRN 9FT ADLT (ELECTROSURGICAL) ×2 IMPLANT
EVACUATOR SILICONE 100CC (DRAIN) ×2 IMPLANT
GLOVE BIO SURGEON STRL SZ7 (GLOVE) ×3 IMPLANT
GLOVE BIOGEL PI IND STRL 7.5 (GLOVE) ×2 IMPLANT
GLOVE BIOGEL PI INDICATOR 7.5 (GLOVE) ×1
GLOVE ECLIPSE 8.0 STRL XLNG CF (GLOVE) ×2 IMPLANT
GLOVE INDICATOR 8.0 STRL GRN (GLOVE) ×2 IMPLANT
GOWN STRL REUS W/ TWL LRG LVL3 (GOWN DISPOSABLE) ×5 IMPLANT
GOWN STRL REUS W/ TWL XL LVL3 (GOWN DISPOSABLE) ×2 IMPLANT
GOWN STRL REUS W/TWL LRG LVL3 (GOWN DISPOSABLE) ×2
GOWN STRL REUS W/TWL XL LVL3 (GOWN DISPOSABLE) ×2
GRASPER SUT TROCAR 14GX15 (MISCELLANEOUS) ×1 IMPLANT
HEMOSTAT SNOW SURGICEL 2X4 (HEMOSTASIS) ×4 IMPLANT
KIT BASIN OR (CUSTOM PROCEDURE TRAY) ×3 IMPLANT
KIT TURNOVER KIT B (KITS) ×3 IMPLANT
NS IRRIG 1000ML POUR BTL (IV SOLUTION) ×3 IMPLANT
PAD ARMBOARD 7.5X6 YLW CONV (MISCELLANEOUS) ×3 IMPLANT
POUCH RETRIEVAL ECOSAC 10 (ENDOMECHANICALS) ×2 IMPLANT
POUCH RETRIEVAL ECOSAC 10MM (ENDOMECHANICALS) ×1
RELOAD STAPLE 45 3.5 BLU ETS (ENDOMECHANICALS) IMPLANT
RELOAD STAPLE TA45 3.5 REG BLU (ENDOMECHANICALS) ×3 IMPLANT
SCISSORS LAP 5X35 DISP (ENDOMECHANICALS) ×3 IMPLANT
SET IRRIG TUBING LAPAROSCOPIC (IRRIGATION / IRRIGATOR) ×3 IMPLANT
SLEEVE ENDOPATH XCEL 5M (ENDOMECHANICALS) ×6 IMPLANT
SPECIMEN JAR SMALL (MISCELLANEOUS) ×3 IMPLANT
STRIP CLOSURE SKIN 1/2X4 (GAUZE/BANDAGES/DRESSINGS) ×3 IMPLANT
SUT ETHILON 2 0 FS 18 (SUTURE) ×2 IMPLANT
SUT MNCRL AB 4-0 PS2 18 (SUTURE) ×5 IMPLANT
SUT VICRYL 0 UR6 27IN ABS (SUTURE) ×3 IMPLANT
TOWEL OR 17X24 6PK STRL BLUE (TOWEL DISPOSABLE) ×3 IMPLANT
TRAY LAPAROSCOPIC MC (CUSTOM PROCEDURE TRAY) ×3 IMPLANT
TROCAR XCEL 12X100 BLDLESS (ENDOMECHANICALS) ×2 IMPLANT
TROCAR XCEL NON-BLD 11X100MML (ENDOMECHANICALS) ×2 IMPLANT
TROCAR XCEL NON-BLD 5MMX100MML (ENDOMECHANICALS) ×3 IMPLANT
TUBING INSUFFLATION (TUBING) ×3 IMPLANT
WATER STERILE IRR 1000ML POUR (IV SOLUTION) ×3 IMPLANT

## 2018-04-03 NOTE — Anesthesia Preprocedure Evaluation (Addendum)
Anesthesia Evaluation  Patient identified by MRN, date of birth, ID band Patient awake    Reviewed: Allergy & Precautions, NPO status , Patient's Chart, lab work & pertinent test results  Airway Mallampati: I  TM Distance: >3 FB Neck ROM: Full    Dental  (+) Teeth Intact, Dental Advisory Given   Pulmonary asthma , former smoker,    breath sounds clear to auscultation       Cardiovascular hypertension, Pt. on medications  Rhythm:Regular Rate:Normal     Neuro/Psych  Headaches, PSYCHIATRIC DISORDERS Anxiety Depression  Neuromuscular disease    GI/Hepatic negative GI ROS, Neg liver ROS,   Endo/Other  diabetes, Type 2, Insulin Dependent, Oral Hypoglycemic AgentsMorbid obesity  Renal/GU Renal disease     Musculoskeletal negative musculoskeletal ROS (+)   Abdominal (+) + obese,   Peds  Hematology  (+) Sickle cell anemia ,   Anesthesia Other Findings   Reproductive/Obstetrics                            Lab Results  Component Value Date   WBC 11.3 (H) 04/02/2018   HGB 9.0 (L) 04/02/2018   HCT 28.2 (L) 04/02/2018   MCV 68.0 (L) 04/02/2018   PLT 351 04/02/2018   Lab Results  Component Value Date   CREATININE 1.00 04/02/2018   BUN 8 04/02/2018   NA 132 (L) 04/02/2018   K 3.6 04/02/2018   CL 97 (L) 04/02/2018   CO2 24 04/02/2018   Lab Results  Component Value Date   INR 1.06 04/01/2018   INR 0.93 03/10/2017   EKG: sinus tachycardia.  Anesthesia Physical Anesthesia Plan  ASA: III  Anesthesia Plan: General   Post-op Pain Management:    Induction: Intravenous, Rapid sequence and Cricoid pressure planned  PONV Risk Score and Plan: 4 or greater and Dexamethasone, Midazolam, Scopolamine patch - Pre-op and Ondansetron  Airway Management Planned: Oral ETT  Additional Equipment: None  Intra-op Plan:   Post-operative Plan: Extubation in OR  Informed Consent: I have reviewed the  patients History and Physical, chart, labs and discussed the procedure including the risks, benefits and alternatives for the proposed anesthesia with the patient or authorized representative who has indicated his/her understanding and acceptance.   Dental advisory given  Plan Discussed with: CRNA  Anesthesia Plan Comments:        Anesthesia Quick Evaluation

## 2018-04-03 NOTE — Transfer of Care (Signed)
Immediate Anesthesia Transfer of Care Note  Patient: Cynthia Rosales  Procedure(s) Performed: LAPAROSCOPIC CHOLECYSTECTOMY (N/A Abdomen)  Patient Location: PACU  Anesthesia Type:General  Level of Consciousness: awake, alert  and oriented  Airway & Oxygen Therapy: Patient Spontanous Breathing and Patient connected to nasal cannula oxygen  Post-op Assessment: Report given to RN, Post -op Vital signs reviewed and stable and Patient moving all extremities X 4  Post vital signs: Reviewed and stable  Last Vitals:  Vitals Value Taken Time  BP    Temp 36.5 C 04/03/2018 10:27 AM  Pulse 110 04/03/2018 10:29 AM  Resp 26 04/03/2018 10:29 AM  SpO2 100 % 04/03/2018 10:29 AM  Vitals shown include unvalidated device data.  Last Pain:  Vitals:   04/03/18 0553  TempSrc:   PainSc: 4          Complications: No apparent anesthesia complications

## 2018-04-03 NOTE — Op Note (Signed)
Preoperative diagnosis:acute cholecystitis Postoperative diagnosis:same as above Procedure: Laparoscopic cholecystectomy Surgeon: Dr. Serita Grammes Asst: Dr. Neysa Bonito ( I needed assistant due to complex case and retraction to complete the operation) Anesthesia: Gen. Specimens: gb to pathology Estimated blood loss:minimal Complications: None Drains: none Sponge count was correct at completion Disposition to recovery stable  Indications: This is a50 yof who has gallstones and ultrasoundand appears to have acute cholecystitis. She had elevated lfts which are all downtrending or normal now. We discussed going to the or for laparoscopic cholecystectomy with cholangiogram.  I discussed risks of surgery with her.   Procedure: After informed consent was obtained the patient was taken to the operating room.She was givenantibiotics. SCDs were in place.She was placed undergeneral anesthesia without complication. Herabdomen was prepped and draped in the standard sterile surgical fashion. A surgical timeout was then performed.  Due to her prior surgery, I elected to enter optically in the left upper quadrant.  I infiltrated marcaine and made a left upper quadrant incision. The stomach was evacuated with an og tube by anesthesia.  I then inserted a 5 mm optiview trocar without injury.I insufflated the abdomen to 15 mm Hg pressure. There was no entry injury. I noted midline adhesions from above the umbilicus to the pelvis. I inserted a 5 mm trocar in the left mid abdomen and lysed adhesions to make a space.  This was done without injury. They were omental adhesions to the abdominal wall.  I then was able to place an 11 mm trocar in the supraumbilical position. An additionalruq, right mid abdomenand epigastric trocar were both placed under direct vision (5 mm). The gallbladder had evidence ofacutecholecystitis. I grasped the gallbladder and retracted it cephalad and lateral.I dissected the  gallbladder from a thick rind that was encasing it.  I went down to the triangle and t appeared that she had a Mirizzis syndrome type gallbladder.  There was not a cystic duct and it appeared the gallbladder was fused to the common duct.  I was able to clip and divide the cystic artery.  At that point there was no real way to do a cholangiogram.  I also did not feel further dissection was safe given the inflammation and nature of the gallbladder relationship to the common duct.  I had Dr Johney Maine come help at that time as I needed assistance with retraction. I made a hole in the gallbladder and evacuated the stones that were proximal towards the common duct.  I then sized the epigastric trocar up to a 12 mm trocar. I inserted a GIA stapler and divided the gallbladder leaving a small cuff of gallbladder on what appeared to be the duct.  I then removed the gallbladder from the liver bed. I placed the gallbladderin a bag and removed from the epigastrium.  I obtained hemostasis.  I then inserted two pieces of surgicel snow in the gallbladder fossa.  I then placed a 19 Fr Blake drain in the gb fossa through the left lateral port site. This was secured with 2-0 nylon suture.  I then removed the epigastric trocar and closed this with a 0 vicryl suture using the endoclose device. I did the same with the supraumbilical trocar. I then removed the remaining trocars and these were closed with 4-0 Monocryl and glue. She tolerated this well be transferred to the recovery room

## 2018-04-03 NOTE — Anesthesia Procedure Notes (Addendum)
Procedure Name: Intubation Date/Time: 04/03/2018 8:18 AM Performed by: Wilburn Cornelia, CRNA Pre-anesthesia Checklist: Patient identified, Emergency Drugs available, Suction available and Patient being monitored Patient Re-evaluated:Patient Re-evaluated prior to induction Oxygen Delivery Method: Circle System Utilized Preoxygenation: Pre-oxygenation with 100% oxygen Induction Type: IV induction Ventilation: Mask ventilation without difficulty Laryngoscope Size: Mac and 3 Grade View: Grade I Tube type: Oral Tube size: 7.0 mm Number of attempts: 1 Airway Equipment and Method: Stylet and Oral airway Placement Confirmation: ETT inserted through vocal cords under direct vision,  positive ETCO2 and breath sounds checked- equal and bilateral Secured at: 22 cm Tube secured with: Tape Dental Injury: Teeth and Oropharynx as per pre-operative assessment

## 2018-04-03 NOTE — Progress Notes (Signed)
PROGRESS NOTE    Cynthia Rosales  HQI:[id number] DOB: February 10, 1981 DOA: 04/01/2018 PCP: Medicine, [facility]   Brief Narrative: Cynthia Rosales is a 37 y.o. female with a history of sickle cell disease, diabetes mellitus, hypertension, depression.  Patient presented with abdominal pain, nausea, and vomiting.  Initial concern for acute hepatitis, secondary to unremarkable CT scan.  Right upper quadrant ultrasound was obtained and was significant for acute cholecystitis.   Assessment & Plan:   Principal Problem:   Acute cholecystitis Active Problems:   Diabetes mellitus (HCC)   HTN (hypertension), benign   Sickle cell disease without crisis ([place])   Morbid obesity with body mass index (BMI) of 50.0 to 59.9 in adult [facility])   Mass of uterus   Transaminitis   Acute cholecystitis Patient with associated elevated liver enzymes and alk phos.  Pain is significant.  Acute hepatitis panel is negative. -General surgery recommendations: Cholecystectomy -Continue home oxycodone while able to take by mouth  Diabetes mellitus Uncontrolled, insulin-dependent, hyperglycemia.  Patient on 70/30 as an outpatient. -Continue to hold 70/30 -Continue sliding scale insulin every 4 hours until patient taking P.O. Consistently.  Sickle cell disease Patient currently not in crisis.  Hemoglobin down to 9 which is close at the patient's baseline.  High threshold for transfusion.  Morbid obesity Body mass index is 52.37 kg/m.   DVT prophylaxis: SCDs Code Status:   Code Status: Full Code Family Communication: Son at bedside Disposition Plan: Anticipate discharge home in 24-48 hours   Consultants:   General surgery  Procedures:   None  Antimicrobials:  Cefepime (5/4>>  Ceftriaxone  Flagyl   Subjective: Pain. Otherwise, no concerns.  Objective: Vitals:   04/03/18 1045 04/03/18 1100 04/03/18 1115 04/03/18 1131  BP: 137/71 139/68 134/74 134/77  Pulse: (!) 107 (!) 106 (!) 106  (!) 103  Resp: 20 (!) '21 19 18  '$ Temp:   97.7 F (36.5 C) 98.2 F (36.8 C)  TempSrc:    Oral  SpO2: 100% 100% 100% 98%  Weight:      Height:        Intake/Output Summary (Last 24 hours) at 04/03/2018 1313 Last data filed at 04/03/2018 1150 Gross per 24 hour  Intake 2662.5 ml  Output 1950 ml  Net 712.5 ml   Filed Weights   04/01/18 2027  Weight: (!) 138.4 kg (305 lb 1.9 oz)    Examination:  General exam: Appears calm and comfortable Respiratory system: Clear to auscultation. Respiratory effort normal. Cardiovascular system: S1 & S2 heard, RRR. No murmurs, rubs, gallops or clicks. Gastrointestinal system: Abdomen is nondistended, soft and mildly tender. Surgical incisions intact and without oozing. Normal bowel sounds heard. Central nervous system: Drowsy and oriented. No focal neurological deficits. Extremities: No edema. No calf tenderness Skin: No cyanosis. No rashes Psychiatry: Judgement and insight appear normal. Mood & affect appropriate.     Data Reviewed: I have personally reviewed following labs and imaging studies  CBC: Recent Labs  Lab 04/01/18 1129 04/01/18 1153 04/02/18 0631 04/03/18 0549  WBC 12.5*  --  11.3* 10.3  NEUTROABS 10.8*  --   --   --   HGB 11.3* 13.6 9.0* 9.6*  HCT 35.3* 40.0 28.2* 30.6*  MCV 68.3*  --  68.0* 68.0*  PLT 395  --  351 132   Basic Metabolic Panel: Recent Labs  Lab 04/01/18 1129 04/01/18 1153 04/01/18 1914 04/02/18 0631 04/03/18 0549  NA 136 136  --  132* 136  K 4.0 4.2  --  3.6 3.5  CL 99* 101  --  97* 100*  CO2 20*  --   --  24 25  GLUCOSE 268* 270*  --  285* 135*  BUN 9 10  --  8 <5*  CREATININE 0.97 0.80  --  1.00 0.81  CALCIUM 9.2  --   --  8.1* 8.2*  MG  --   --  1.6*  --   --   PHOS  --   --  3.5  --   --    GFR: Estimated Creatinine Clearance: 129.8 mL/min (by C-G formula based on SCr of 0.81 mg/dL). Liver Function Tests: Recent Labs  Lab 04/01/18 1129 04/02/18 0631 04/03/18 0549  AST 679* 340* 155*    ALT 850* 604* 460*  ALKPHOS 298* 255* 238*  BILITOT 2.7* 2.3* 1.1  PROT 8.2* 6.6 6.7  ALBUMIN 3.4* 2.6* 2.7*   Recent Labs  Lab 04/01/18 1129 04/02/18 0631 04/03/18 0549  LIPASE '28 30 24   '$ No results for input(s): AMMONIA in the last 168 hours. Coagulation Profile: Recent Labs  Lab 04/01/18 1914  INR 1.06   Cardiac Enzymes: Recent Labs  Lab 04/01/18 1643  CKTOTAL 171   BNP (last 3 results) No results for input(s): PROBNP in the last 8760 hours. HbA1C: No results for input(s): HGBA1C in the last 72 hours. CBG: Recent Labs  Lab 04/02/18 2044 04/03/18 0023 04/03/18 0412 04/03/18 0647 04/03/18 1031  GLUCAP 177* 148* 169* 132* 223*   Lipid Profile: No results for input(s): CHOL, HDL, LDLCALC, TRIG, CHOLHDL, LDLDIRECT in the last 72 hours. Thyroid Function Tests: No results for input(s): TSH, T4TOTAL, FREET4, T3FREE, THYROIDAB in the last 72 hours. Anemia Panel: Recent Labs    04/01/18 1914  RETICCTPCT 0.9   Sepsis Labs: No results for input(s): PROCALCITON, LATICACIDVEN in the last 168 hours.  Recent Results (from the past 240 hour(s))  Urine culture     Status: Abnormal   Collection Time: 04/01/18  3:48 PM  Result Value Ref Range Status   Specimen Description URINE, CLEAN CATCH  Final   Special Requests   Final    NONE Performed at [facility], [address], [place], [place] [address]    Culture MULTIPLE SPECIES PRESENT, SUGGEST RECOLLECTION (A)  Final   Report Status 04/02/2018 FINAL  Final  Surgical pcr screen     Status: None   Collection Time: 04/02/18 12:12 PM  Result Value Ref Range Status   MRSA, PCR NEGATIVE NEGATIVE Final   Staphylococcus aureus NEGATIVE NEGATIVE Final    Comment: (NOTE) The Xpert SA Assay (FDA approved for NASAL specimens in patients 8 years of age and older), is one component of a comprehensive surveillance program. It is not intended to diagnose infection nor to guide or monitor treatment. Performed at  [facility], [place] [address] [place], [place] [address]          Radiology Studies: Ct Abdomen Pelvis W Contrast  Result Date: 04/01/2018 CLINICAL DATA:  Left-sided abdominal pain with nausea and vomiting. EXAM: CT ABDOMEN AND PELVIS WITH CONTRAST TECHNIQUE: Multidetector CT imaging of the abdomen and pelvis was performed using the standard protocol following bolus administration of intravenous contrast. CONTRAST:  16m OMNIPAQUE IOHEXOL 300 MG/ML  SOLN COMPARISON:  CT scan dated 06/19/2014 FINDINGS: Lower chest: No acute abnormality. Hepatobiliary: No focal liver abnormality is seen. No gallstones, gallbladder wall thickening, or biliary dilatation. Pancreas: Unremarkable. No pancreatic ductal dilatation or surrounding inflammatory changes. Spleen:  Normal in size without focal abnormality. Adrenals/Urinary Tract: Adrenal glands are unremarkable. Kidneys are normal, without renal calculi, focal lesion, or hydronephrosis. Bladder is unremarkable. Stomach/Bowel: Stomach is within normal limits. Appendix has been removed. No evidence of bowel wall thickening, distention, or inflammatory changes. Vascular/Lymphatic: No significant vascular findings are present. No enlarged abdominal or pelvic lymph nodes. Reproductive: New 4.8 cm mass in left side of the uterus, statistically most likely a fibroid. No adnexal masses. Other: No abdominal wall hernia or abnormality. No abdominopelvic ascites. Musculoskeletal: No acute or significant osseous findings. IMPRESSION: No acute abnormalities of the abdomen or pelvis. New 4.8 cm mass in the left side of the uterus, most likely a fibroid. Electronically Signed   By: Lorriane Shire M.D.   On: 04/01/2018 14:14   US Abdomen Limited Ruq  Result Date: 04/01/2018 CLINICAL DATA:  Right upper quadrant pain. EXAM: ULTRASOUND ABDOMEN LIMITED RIGHT UPPER QUADRANT COMPARISON:  CT scan dated 04/01/2018 FINDINGS: Gallbladder: Multiple gallstones. No gallbladder wall  thickening. Positive sonographic Murphy's sign. Common bile duct: Diameter: 4 mm, normal. Liver: No focal lesion identified. Within normal limits in parenchymal echogenicity. Portal vein is patent on color Doppler imaging with normal direction of blood flow towards the liver. IMPRESSION: Cholelithiasis. Positive sonographic Murphy's sign. Findings suggest acute cholecystitis. Electronically Signed   By: Lorriane Shire M.D.   On: 04/01/2018 16:32        Scheduled Meds: . hydrochlorothiazide  25 mg Oral Daily  . HYDROmorphone      . insulin aspart      . insulin aspart  0-15 Units Subcutaneous Q4H  . lip balm  1 application Topical BID  . oxyCODONE       Continuous Infusions: . sodium chloride 75 mL/hr at 04/03/18 1150  . acetaminophen    . [START ON 04/04/2018] cefTRIAXone (ROCEPHIN)  IV    . methocarbamol (ROBAXIN)  IV       LOS: 2 days     Cordelia Poche, MD [facility] 04/03/2018, 1:13 PM Pager: [phone]  If 7PM-7AM, please contact night-coverage [email or web address] 04/03/2018, 1:13 PM

## 2018-04-03 NOTE — Anesthesia Postprocedure Evaluation (Signed)
Anesthesia Post Note  Patient: Cynthia Rosales  Procedure(s) Performed: LAPAROSCOPIC CHOLECYSTECTOMY (N/A Abdomen)     Patient location during evaluation: PACU Anesthesia Type: General Level of consciousness: awake and alert Vital Signs Assessment: post-procedure vital signs reviewed and stable Respiratory status: spontaneous breathing, nonlabored ventilation, respiratory function stable and patient connected to nasal cannula oxygen Cardiovascular status: blood pressure returned to baseline and stable Postop Assessment: no apparent nausea or vomiting Anesthetic complications: no Comments: IV Tylenol and addition HM being administered.     Last Vitals:  Vitals:   04/03/18 1100 04/03/18 1115  BP: 139/68 134/74  Pulse: (!) 106 (!) 106  Resp: (!) 21 19  Temp:  36.5 C  SpO2: 100% 100%                  Effie Berkshire

## 2018-04-03 NOTE — Interval H&P Note (Signed)
History and Physical Interval Note:  04/03/2018 8:04 AM ruq pain, gallstones on [place], lfts are normalizing today, will proceed with lap chole, possible cholangiogram today.   I discussed the procedure in detail.   We discussed the risks and benefits of a laparoscopic cholecystectomy and possible cholangiogram including, but not limited to bleeding, infection, injury to surrounding structures such as the intestine or liver, bile leak, retained gallstones, need to convert to an open procedure, prolonged diarrhea, blood clots such as  DVT, common bile duct injury, anesthesia risks, and possible need for additional procedures.  The likelihood of improvement in symptoms and return to the patient's normal status is good. We discussed the typical post-operative recovery course. Cynthia Rosales  has presented today for surgery, with the diagnosis of gallstones  The various methods of treatment have been discussed with the patient and family. After consideration of risks, benefits and other options for treatment, the patient has consented to  Procedure(s): LAPAROSCOPIC CHOLECYSTECTOMY WITH INTRAOPERATIVE CHOLANGIOGRAM (N/A) as a surgical intervention .  The patient's history has been reviewed, patient examined, no change in status, stable for surgery.  I have reviewed the patient's chart and labs.  Questions were answered to the patient's satisfaction.     Rolm Bookbinder

## 2018-04-04 ENCOUNTER — Encounter (HOSPITAL_COMMUNITY): Payer: Self-pay | Admitting: General Surgery

## 2018-04-04 LAB — COMPREHENSIVE METABOLIC PANEL
ALT: 470 U/L — ABNORMAL HIGH (ref 14–54)
AST: 309 U/L — ABNORMAL HIGH (ref 15–41)
Albumin: 2.6 g/dL — ABNORMAL LOW (ref 3.5–5.0)
Alkaline Phosphatase: 283 U/L — ABNORMAL HIGH (ref 38–126)
Anion gap: 9 (ref 5–15)
BUN: <5 mg/dL — ABNORMAL LOW (ref 6–20)
CO2: 24 mmol/L (ref 22–32)
Calcium: 7.9 mg/dL — ABNORMAL LOW (ref 8.9–10.3)
Chloride: 102 mmol/L (ref 101–111)
Creatinine, Ser: 0.78 mg/dL (ref 0.44–1.00)
GFR calc Af Amer: >60 mL/min (ref >60)
GFR calc non Af Amer: >60 mL/min (ref >60)
Glucose, Bld: 177 mg/dL — ABNORMAL HIGH (ref 65–99)
Potassium: 3.7 mmol/L (ref 3.5–5.1)
Sodium: 135 mmol/L (ref 135–145)
Total Bilirubin: 2.6 mg/dL — ABNORMAL HIGH (ref 0.3–1.2)
Total Protein: 6.6 g/dL (ref 6.5–8.1)

## 2018-04-04 LAB — GLUCOSE, CAPILLARY
Glucose-Capillary: 156 mg/dL — ABNORMAL HIGH (ref 65–99)
Glucose-Capillary: 168 mg/dL — ABNORMAL HIGH (ref 65–99)
Glucose-Capillary: 174 mg/dL — ABNORMAL HIGH (ref 65–99)
Glucose-Capillary: 179 mg/dL — ABNORMAL HIGH (ref 65–99)
Glucose-Capillary: 185 mg/dL — ABNORMAL HIGH (ref 65–99)
Glucose-Capillary: 195 mg/dL — ABNORMAL HIGH (ref 65–99)

## 2018-04-04 LAB — CBC
HCT: 29.9 % — ABNORMAL LOW (ref 36.0–46.0)
Hemoglobin: 9.5 g/dL — ABNORMAL LOW (ref 12.0–15.0)
MCH: 21.6 pg — ABNORMAL LOW (ref 26.0–34.0)
MCHC: 31.8 g/dL (ref 30.0–36.0)
MCV: 68 fL — ABNORMAL LOW (ref 78.0–100.0)
Platelets: 392 10*3/uL (ref 150–400)
RBC: 4.4 MIL/uL (ref 3.87–5.11)
RDW: 17.8 % — ABNORMAL HIGH (ref 11.5–15.5)
WBC: 11.9 10*3/uL — ABNORMAL HIGH (ref 4.0–10.5)

## 2018-04-04 LAB — MAGNESIUM: Magnesium: 1.4 mg/dL — ABNORMAL LOW (ref 1.7–2.4)

## 2018-04-04 MED ORDER — METHOCARBAMOL 500 MG PO TABS
750.0000 mg | ORAL_TABLET | Freq: Three times a day (TID) | ORAL | Status: DC
  Administered 2018-04-04 – 2018-04-06 (×7): 750 mg via ORAL
  Filled 2018-04-04 (×7): qty 2

## 2018-04-04 MED ORDER — GABAPENTIN 100 MG PO CAPS
200.0000 mg | ORAL_CAPSULE | Freq: Two times a day (BID) | ORAL | Status: DC
  Administered 2018-04-04 – 2018-04-06 (×5): 200 mg via ORAL
  Filled 2018-04-04 (×5): qty 2

## 2018-04-04 MED ORDER — OXYCODONE HCL 5 MG PO TABS
30.0000 mg | ORAL_TABLET | Freq: Four times a day (QID) | ORAL | Status: DC | PRN
  Administered 2018-04-04 – 2018-04-05 (×4): 30 mg via ORAL
  Filled 2018-04-04 (×4): qty 6

## 2018-04-04 MED ORDER — INSULIN ASPART PROT & ASPART (70-30 MIX) 100 UNIT/ML ~~LOC~~ SUSP
20.0000 [IU] | Freq: Two times a day (BID) | SUBCUTANEOUS | Status: DC
  Administered 2018-04-04 – 2018-04-06 (×5): 20 [IU] via SUBCUTANEOUS
  Filled 2018-04-04: qty 10

## 2018-04-04 MED ORDER — ENOXAPARIN SODIUM 80 MG/0.8ML ~~LOC~~ SOLN
70.0000 mg | SUBCUTANEOUS | Status: DC
  Administered 2018-04-04 – 2018-04-06 (×3): 70 mg via SUBCUTANEOUS
  Filled 2018-04-04 (×3): qty 0.8

## 2018-04-04 MED ORDER — MAGNESIUM SULFATE 2 GM/50ML IV SOLN
2.0000 g | Freq: Once | INTRAVENOUS | Status: AC
  Administered 2018-04-04: 2 g via INTRAVENOUS
  Filled 2018-04-04: qty 50

## 2018-04-04 NOTE — Progress Notes (Signed)
ANTICOAGULATION CONSULT NOTE - Initial Consult  Pharmacy Consult for Lovenox Indication: VTE prophylaxis  Allergies  Allergen Reactions  . Amoxicillin Hives, Rash and Other (See Comments)    PATIENT HAS HAD A PCN REACTION WITH IMMEDIATE RASH, FACIAL/TONGUE/THROAT SWELLING, SOB, OR LIGHTHEADEDNESS WITH HYPOTENSION:  #  #  YES  #  #  Has patient had a PCN reaction causing severe rash involving mucus membranes or skin necrosis: No Has patient had a PCN reaction that required hospitalization: No  Has patient had a PCN reaction occurring within the last 10 years: #  #  #  YES  #  #  #  If all of the above answers are "NO", then may proceed with Cephalosporin use.    . Buprenorphine Hcl Itching and Nausea Only  . Morphine And Related Itching and Nausea Only    Patient Measurements: Height: 5\' 4"  (162.6 cm) Weight: (!) 305 lb 1.9 oz (138.4 kg) IBW/kg (Calculated) : 54.7  Vital Signs: BP: 124/76 (05/06 0449) Pulse Rate: 115 (05/06 0449)  Labs: Recent Labs    04/01/18 1643 04/01/18 1914 04/02/18 0631 04/03/18 0549 04/04/18 0421  HGB  --   --  9.0* 9.6* 9.5*  HCT  --   --  28.2* 30.6* 29.9*  PLT  --   --  351 386 392  APTT  --  20*  --   --   --   LABPROT  --  13.7  --   --   --   INR  --  1.06  --   --   --   CREATININE  --   --  1.00 0.81 0.78  CKTOTAL 171  --   --   --   --     Estimated Creatinine Clearance: 131.5 mL/min (by C-G formula based on SCr of 0.78 mg/dL).  Assessment: 80 YOF s/p lap cholecystectomy on 5/5. Patient with BMI >30 and CrCl >29mL/min. Hgb low, platelets normal- no bleeding noted post-op.  Goal of Therapy:  Proper dosing based on weight and renal function   Plan:  Lovenox 0.5mg /kg (70mg ) subQ q24h  Pharmacy will sign off as no dosing adjustments anticipated  Killian Ress D. Jeron Grahn, PharmD, BCPS Clinical Pharmacist Clinical Phone for 04/04/2018 until 3:30pm: [phone] If after 3:30pm, please call main pharmacy at [phone] 04/04/2018 8:48 AM

## 2018-04-04 NOTE — Progress Notes (Addendum)
Patient ID: Cynthia Rosales, female   DOB: 1981-07-07, 37 y.o.   MRN: [id number]   Acute Care Surgery Service Progress Note:    Chief Complaint/Subjective: A little nausea but no emesis. Tolerated clears. Hasn't gotten out of bed yet. Pain not controlled  Objective: Vital signs in last 24 hours: Temp:  [97.7 F (36.5 C)-99.1 F (37.3 C)] 99.1 F (37.3 C) (05/05 2036) Pulse Rate:  [94-115] 115 (05/06 0449) Resp:  [18-26] 20 (05/06 0449) BP: (124-141)/(67-88) 124/76 (05/06 0449) SpO2:  [98 %-100 %] 99 % (05/06 0449) Last BM Date: 03/29/18  Intake/Output from previous day: 05/05 0701 - 05/06 0700 In: 2220 [P.O.:120; I.V.:1900] Out: 570 [Urine:300; Drains:220; Blood:50] Intake/Output this shift: No intake/output data recorded.  Lungs: cta, nonlabored  Cardiovascular: reg  Abd: soft, obese, approp TTP, incisions ok; drain serosang  Extremities: no edema, +SCDs  Neuro: alert, nonfocal, appears a little uncomfortable  Lab Results: CBC  Recent Labs    04/03/18 0549 04/04/18 0421  WBC 10.3 11.9*  HGB 9.6* 9.5*  HCT 30.6* 29.9*  PLT 386 392   BMET Recent Labs    04/03/18 0549 04/04/18 0421  NA 136 135  K 3.5 3.7  CL 100* 102  CO2 25 24  GLUCOSE 135* 177*  BUN <5* <5*  CREATININE 0.81 0.78  CALCIUM 8.2* 7.9*   LFT Hepatic Function Latest Ref Rng & Units 04/04/2018 04/03/2018 04/02/2018  Total Protein 6.5 - 8.1 g/dL 6.6 6.7 6.6  Albumin 3.5 - 5.0 g/dL 2.6(L) 2.7(L) 2.6(L)  AST 15 - 41 U/L 309(H) 155(H) 340(H)  ALT 14 - 54 U/L 470(H) 460(H) 604(H)  Alk Phosphatase 38 - 126 U/L 283(H) 238(H) 255(H)  Total Bilirubin 0.3 - 1.2 mg/dL 2.6(H) 1.1 2.3(H)  Bilirubin, Direct 0.0 - 0.3 mg/dL - - -   PT/INR Recent Labs    04/01/18 1914  LABPROT 13.7  INR 1.06   ABG No results for input(s): PHART, HCO3 in the last 72 hours.  Invalid input(s): PCO2, PO2  Studies/Results:  Anti-infectives: Anti-infectives (From admission, onward)   Start     Dose/Rate Route  Frequency Ordered Stop   04/04/18 0800  cefTRIAXone (ROCEPHIN) 2 g in sodium chloride 0.9 % 100 mL IVPB  Status:  Discontinued    Note to Pharmacy:  Pharmacy may adjust dosing strength / duration / interval for maximal efficacy   2 g 200 mL/hr over 30 Minutes Intravenous Every 24 hours 04/03/18 1155 04/03/18 1317   04/03/18 0715  cefTRIAXone (ROCEPHIN) 2 g in sodium chloride 0.9 % 100 mL IVPB  Status:  Discontinued     2 g 200 mL/hr over 30 Minutes Intravenous On call to O.R. 04/02/18 1021 04/03/18 1126   04/03/18 0715  metroNIDAZOLE (FLAGYL) IVPB 500 mg  Status:  Discontinued     500 mg 100 mL/hr over 60 Minutes Intravenous On call to O.R. 04/02/18 1021 04/03/18 1126   04/02/18 1200  cefTRIAXone (ROCEPHIN) 2 g in sodium chloride 0.9 % 100 mL IVPB  Status:  Discontinued    Note to Pharmacy:  Pharmacy may adjust dosing strength / duration / interval for maximal efficacy   2 g 200 mL/hr over 30 Minutes Intravenous Every 24 hours 04/02/18 1019 04/03/18 1155   04/02/18 1100  ceFEPIme (MAXIPIME) 1 g in sodium chloride 0.9 % 100 mL IVPB  Status:  Discontinued     1 g 200 mL/hr over 30 Minutes Intravenous Every 8 hours 04/02/18 1000 04/02/18 1019   04/02/18 1100  metroNIDAZOLE (  FLAGYL) IVPB 500 mg  Status:  Discontinued     500 mg 100 mL/hr over 60 Minutes Intravenous Every 8 hours 04/02/18 1000 04/03/18 1131      Medications: Scheduled Meds: . gabapentin  200 mg Oral BID  . hydrochlorothiazide  25 mg Oral Daily  . insulin aspart  0-15 Units Subcutaneous Q4H  . lip balm  1 application Topical BID  . methocarbamol  750 mg Oral TID   Continuous Infusions: . sodium chloride 75 mL/hr at 04/04/18 0118   PRN Meds:.acetaminophen, acetaminophen, albuterol, alum & mag hydroxide-simeth, bisacodyl, diazepam, guaiFENesin-dextromethorphan, hydrocortisone, hydrocortisone cream, HYDROmorphone (DILAUDID) injection, ibuprofen, magic mouthwash, menthol-cetylpyridinium, ondansetron (ZOFRAN) IV, oxycodone,  phenol  Assessment/Plan: Patient Active Problem List   Diagnosis Date Noted  . Morbid obesity with body mass index (BMI) of 50.0 to 59.9 in adult ([place]) 04/02/2018  . Mass of uterus 04/02/2018  . Transaminitis 04/02/2018  . Acute cholecystitis s/p lap cholecystectomy 04/03/2018 04/01/2018  . Pregnancy, ectopic, cornual or cervical 07/21/2017  . 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  . Neurogenic bladder 08/23/2015  . Bilateral carpal tunnel syndrome 08/23/2015  . Paraparesis of both lower limbs ([place]) 07/14/2015  . Ileus, postoperative ([place]) 12/25/2014  . Sickle cell disease without crisis ([place]) 12/25/2014  . Diabetes mellitus type 2 in obese ([place]) 12/25/2014  . 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[place]) 12/24/2014  . 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[place]) 12/24/2014  . Ileus of unspecified type ([place])   . Atelectasis   . Bowel obstruction ([place])   . Encounter for central line care   . Ileus ([place])   . Intestinal occlusion ([facility])   . 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  . Headache 11/19/2014  . Neck pain 11/19/2014  . Right sided weakness 11/19/2014  . Paresthesias 11/19/2014  . Nightmares 09/11/2014  . HTN (hypertension), benign 03/28/2014  . Nephrolithiasis 03/27/2014  . Diabetes mellitus ([place]) 03/27/2014  . Abdominal pain 03/27/2014   s/p Procedure(s): LAPAROSCOPIC CHOLECYSTECTOMY 04/03/2018 by Dr Donne Hazel (stapled across infundibulum due to extreme inflammation) -LFTs up, could be from surgery but will need to monitor, if cont to increase will need imaging  VTE prophylaxis - start chemical vte prophylaxis, severe obesity increases risk for VTE; scds; ambulate  Adv diet as tolerated pulm toilet, gave pt IS Pain control - will avoid scheduled tylenol given bump in lfts, will change robaxin to scheduled and add scheduled gabapentin  Disposition:  LOS: 3 days    Leighton Ruff. Redmond Pulling, MD, FACS General, Bariatric, & Minimally Invasive Surgery [phone] [facility]

## 2018-04-04 NOTE — Evaluation (Signed)
Physical Therapy Evaluation Patient Details Name: Cynthia Rosales MRN: [id number] DOB: 1981-08-03 Today's Date: 04/04/2018   History of Present Illness  Pt is a 37 y/o female admitted secondary to abdominal pain, nausea and vomiting. [place] of abdomen suggested acute cholecystitis. Pt s/p laparascopic cholecystectomy on 5/5. PMH includes HTN, DM, sickle cell disease, obesity, and chronic pain.   Clinical Impression  Pt is s/p surgery above with deficits below. Ambulation limited to within the room secondary to pain. Pt overall steady with use of RW, requiring min guard to supervision assist. Educated about walking program to help with decreasing pain. Also asked NT to assist with increasing ambulation throughout the day. Anticipate pt will progress well once pain controlled. Will continue to follow acutely to maximize functional mobility independence and safety.     Follow Up Recommendations Home health PT;Supervision for mobility/OOB    Equipment Recommendations  None recommended by PT    Recommendations for Other Services OT consult     Precautions / Restrictions Precautions Precautions: Fall Restrictions Weight Bearing Restrictions: No      Mobility  Bed Mobility Overal bed mobility: Needs Assistance Bed Mobility: Supine to Sit     Supine to sit: Min assist     General bed mobility comments: Min A for LE assist and scooting hips to EOB. Used bed rails and elevated HOB.   Transfers Overall transfer level: Needs assistance Equipment used: Rolling walker (2 wheeled) Transfers: Sit to/from Stand Sit to Stand: Min guard         General transfer comment: Min guard for safety.   Ambulation/Gait Ambulation/Gait assistance: Min guard;Supervision Ambulation Distance (Feet): 15 Feet Assistive device: Rolling walker (2 wheeled) Gait Pattern/deviations: Step-through pattern;Decreased stride length;Trunk flexed Gait velocity: Decreased  Gait velocity interpretation: <1.31 ft/sec,  indicative of household ambulator General Gait Details: Slow, guarded gait secondary to abdominal pain. Overall steady with use of RW, requiring min guard to supervision. Distance limited to within the room secondary to pain. Educated about importance of ambulation in assisting to decrease abdominal pain.   Stairs            Wheelchair Mobility    Modified Rankin (Stroke Patients Only)       Balance Overall balance assessment: Needs assistance Sitting-balance support: No upper extremity supported;Feet supported Sitting balance-Leahy Scale: Good     Standing balance support: Bilateral upper extremity supported;During functional activity Standing balance-Leahy Scale: Poor Standing balance comment: Reliant on BUE support.                              Pertinent Vitals/Pain Pain Assessment: Faces Faces Pain Scale: Hurts even more Pain Location: stomach  Pain Descriptors / Indicators: Aching Pain Intervention(s): Limited activity within patient's tolerance;Monitored during session;Repositioned    Home Living Family/patient expects to be discharged to:: Private residence Living Arrangements: Children;Other relatives Available Help at Discharge: Family;Available 24 hours/day Type of Home: House Home Access: Ramped entrance     Home Layout: One level Home Equipment: Other (comment);Walker - 4 wheels(mobile chair )      Prior Function Level of Independence: Needs assistance   Gait / Transfers Assistance Needed: Used rollator for ambulation. Needs assist for getting into and out of bed.   ADL's / Homemaking Assistance Needed: Required assist from family for bathing and dressing.         Hand Dominance        Extremity/Trunk Assessment   Upper Extremity Assessment Upper  Extremity Assessment: Defer to OT evaluation    Lower Extremity Assessment Lower Extremity Assessment: Generalized weakness    Cervical / Trunk Assessment Cervical / Trunk  Assessment: Normal  Communication   Communication: No difficulties  Cognition Arousal/Alertness: Awake/alert Behavior During Therapy: WFL for tasks assessed/performed Overall Cognitive Status: Within Functional Limits for tasks assessed                                        General Comments General comments (skin integrity, edema, etc.): Pt's son present at beginning of session     Exercises     Assessment/Plan    PT Assessment Patient needs continued PT services  PT Problem List Decreased strength;Decreased mobility;Decreased activity tolerance;Decreased knowledge of precautions;Pain       PT Treatment Interventions DME instruction;Gait training;Functional mobility training;Therapeutic activities;Therapeutic exercise;Balance training;Patient/family education    PT Goals (Current goals can be found in the Care Plan section)  Acute Rehab PT Goals Patient Stated Goal: to decrease pain  PT Goal Formulation: With patient Time For Goal Achievement: 04/18/18 Potential to Achieve Goals: Good    Frequency Min 3X/week   Barriers to discharge        Co-evaluation               AM-PAC PT "6 Clicks" Daily Activity  Outcome Measure Difficulty turning over in bed (including adjusting bedclothes, sheets and blankets)?: A Little Difficulty moving from lying on back to sitting on the side of the bed? : Unable Difficulty sitting down on and standing up from a chair with arms (e.g., wheelchair, bedside commode, etc,.)?: Unable Help needed moving to and from a bed to chair (including a wheelchair)?: A Little Help needed walking in hospital room?: A Little Help needed climbing 3-5 steps with a railing? : A Lot 6 Click Score: 13    End of Session   Activity Tolerance: Patient limited by pain Patient left: in chair;with call bell/phone within reach Nurse Communication: Mobility status;Other (comment)(ambulation throughout the day ) PT Visit Diagnosis: Other  abnormalities of gait and mobility (R26.89);Pain Pain - part of body: (abdomen )    Time: 8889-1694 PT Time Calculation (min) (ACUTE ONLY): 27 min   Charges:   PT Evaluation $PT Eval Moderate Complexity: 1 Mod PT Treatments $Therapeutic Activity: 8-22 mins   PT G Codes:        Leighton Ruff, PT, DPT  Acute Rehabilitation Services  Pager: [phone]   Rudean Hitt 04/04/2018, 11:50 AM

## 2018-04-04 NOTE — Care Management Note (Addendum)
Case Management Note  Patient Details  Name: Cynthia Rosales MRN: [id number] Date of Birth: 07-19-1981  Subjective/Objective:    Admitted for acute cholecystitis; s/p laparascopic cholecystectomy on 5/5               Action/Plan: PCP noted.  Prior to admission patient lived at home.  At discharge patient plans to discharge to same living situation. At discharge patient has transportation home.  Patient able to afford medications and food. Home DME: Other (comment);Walker - 4 wheels(mobile chair), wheelchair.  NCM discussed recommendation for Home Health Services: PT.  Patient selected [name] states she used them before.  Referral for [facility] PT/OT called to [facility] for [facility] home health.  Patient was active for PT prior to admission with [facility]. NCM will continue to follow for discharge transition needs.   Expected Discharge Date:    Anticipate discharge home in 24-48 hours pending improvement of lab values.            Expected Discharge Plan:   To Be Determined  In-House Referral:   N/A  Discharge planning Services  CM Consult  Post Acute Care Choice:  Home Health Choice offered to:  Patient  DME Arranged:    DME Agency:     HH Arranged:    HH Agency:  [facility]  Status of Service:   In progress, will continue to monitor.  Cynthia Cardinal, RN 04/04/2018, 12:43 PM

## 2018-04-04 NOTE — Progress Notes (Addendum)
PROGRESS NOTE    Cynthia Rosales  WUJ:[id number] DOB: 09-Oct-1981 DOA: 04/01/2018 PCP: Medicine, [facility]   Brief Narrative: Cynthia Rosales is a 37 y.o. female with a history of sickle cell disease, diabetes mellitus, hypertension, depression.  Patient presented with abdominal pain, nausea, and vomiting.  Initial concern for acute hepatitis, secondary to unremarkable CT scan.  Right upper quadrant ultrasound was obtained and was significant for acute cholecystitis.   Assessment & Plan:   Principal Problem:   Acute cholecystitis s/p lap cholecystectomy 04/03/2018 Active Problems:   Diabetes mellitus ([place])   HTN (hypertension), benign   Sickle cell disease without crisis ([place])   Morbid obesity with body mass index (BMI) of 50.0 to 59.9 in adult [facility])   Mass of uterus   Transaminitis   Acute cholecystitis Patient is s/p laparoscopic cholecystectomy on 5/5. Unable to perform cholangiogram. Alk phos, AST, ALT, bilirubin trending up; most likely secondary to recent surgery.  -General surgery recommendations: advance diet as tolerated -Continue home oxycodone while able to take by mouth -Repeat CMP in AM  Diabetes mellitus Uncontrolled, insulin-dependent, hyperglycemia.  Patient on 70/30 as an outpatient. -Restart 70/30 at reduced dose of 20 units BID -Discontinue SSI  Sickle cell disease Patient currently not in crisis.  Hemoglobin down to 9 which is close at the patient's baseline.  High threshold for transfusion.  Morbid obesity Body mass index is 52.37 kg/m.   Back pain Chronic. Not related to pain crisis per patient. Hemoglobin stable. -Continuing home Oxycodone 30 mg q6 hours prn (confirmed via [place] on 5/6)  Uterine mass Seen on CT. Likely fibroid. Outpatient management.  Essential hypertension -Continue hydrochlorothiazide  Anxiety -Continue diazepam   DVT prophylaxis: SCDs Code Status:   Code Status: Full Code Family Communication: Son at  bedside Disposition Plan: Anticipate discharge home in 24-48 hours pending improvement of lab values   Consultants:   General surgery  Procedures:   None  Antimicrobials:  Cefepime (5/4>>  Ceftriaxone  Flagyl   Subjective: Back pain. hungry  Objective: Vitals:   04/03/18 1131 04/03/18 1644 04/03/18 2036 04/04/18 0449  BP: 134/77 139/88 (!) 141/79 124/76  Pulse: (!) 103 (!) 101 94 (!) 115  Resp: _0 Temp: 98.2 F (36.8 C) 98.8 F (37.1 C) 99.1 F (37.3 C)   TempSrc: Oral Oral Oral   SpO2: 98% 100% 100% 99%  Weight:      Height:        Intake/Output Summary (Last 24 hours) at 04/04/2018 0951 Last data filed at 04/04/2018 0842 Gross per 24 hour  Intake 1250 ml  Output 570 ml  Net 680 ml   Filed Weights   04/01/18 2027  Weight: (!) 138.4 kg (305 lb 1.9 oz)    Examination:  General exam: Appears calm and comfortable Respiratory: Clear to auscultation bilaterally. Unlabored work of breathing. No wheezing or rales. Cardiovascular system: Fast rate and regular rhythm. Normal S1 and S2. No heart murmurs present. No extra heart sounds Gastrointestinal system: Soft, mildly tender, non-distended, no guarding, no rebound, no masses felt Central nervous system: Drowsy and oriented. No focal neurological deficits. Musculoskeletal: No edema. No calf tenderness. No midline point back tenderness. Skin: No cyanosis. No rashes Psychiatry: Judgement and insight appear normal. Mood & affect appropriate.     Data Reviewed: I have personally reviewed following labs and imaging studies  CBC: Recent Labs  Lab 04/01/18 1129 04/01/18 1153 04/02/18 0631 04/03/18 0549 04/04/18 0421  WBC 12.5*  --  11.3* 10.3 11.9*  NEUTROABS 10.8*  --   --   --   --   HGB 11.3* 13.6 9.0* 9.6* 9.5*  HCT 35.3* 40.0 28.2* 30.6* 29.9*  MCV 68.3*  --  68.0* 68.0* 68.0*  PLT 395  --  351 386 790   Basic Metabolic Panel: Recent Labs  Lab 04/01/18 1129 04/01/18 1153 04/01/18 1914  04/02/18 0631 04/03/18 0549 04/04/18 0421 04/04/18 0715  NA 136 136  --  132* 136 135  --   K 4.0 4.2  --  3.6 3.5 3.7  --   CL 99* 101  --  97* 100* 102  --   CO2 20*  --   --  _0 --   GLUCOSE 268* 270*  --  285* 135* 177*  --   BUN 9 10  --  8 <5* <5*  --   CREATININE 0.97 0.80  --  1.00 0.81 0.78  --   CALCIUM 9.2  --   --  8.1* 8.2* 7.9*  --   MG  --   --  1.6*  --  1.5*  --  1.4*  PHOS  --   --  3.5  --   --   --   --    GFR: Estimated Creatinine Clearance: 131.5 mL/min (by C-G formula based on SCr of 0.78 mg/dL). Liver Function Tests: Recent Labs  Lab 04/01/18 1129 04/02/18 0631 04/03/18 0549 04/04/18 0421  AST 679* 340* 155* 309*  ALT 850* 604* 460* 470*  ALKPHOS 298* 255* 238* 283*  BILITOT 2.7* 2.3* 1.1 2.6*  PROT 8.2* 6.6 6.7 6.6  ALBUMIN 3.4* 2.6* 2.7* 2.6*   Recent Labs  Lab 04/01/18 1129 04/02/18 0631 04/03/18 0549  LIPASE _1 No results for input(s): AMMONIA in the last 168 hours. Coagulation Profile: Recent Labs  Lab 04/01/18 1914  INR 1.06   Cardiac Enzymes: Recent Labs  Lab 04/01/18 1643  CKTOTAL 171   BNP (last 3 results) No results for input(s): PROBNP in the last 8760 hours. HbA1C: No results for input(s): HGBA1C in the last 72 hours. CBG: Recent Labs  Lab 04/03/18 1653 04/03/18 2033 04/04/18 0015 04/04/18 0448 04/04/18 0818  GLUCAP 203* 187* 156* 195* 174*   Lipid Profile: No results for input(s): CHOL, HDL, LDLCALC, TRIG, CHOLHDL, LDLDIRECT in the last 72 hours. Thyroid Function Tests: No results for input(s): TSH, T4TOTAL, FREET4, T3FREE, THYROIDAB in the last 72 hours. Anemia Panel: Recent Labs    04/01/18 1914  RETICCTPCT 0.9   Sepsis Labs: No results for input(s): PROCALCITON, LATICACIDVEN in the last 168 hours.  Recent Results (from the past 240 hour(s))  Urine culture     Status: Abnormal   Collection Time: 04/01/18  3:48 PM  Result Value Ref Range Status   Specimen Description URINE, CLEAN  CATCH  Final   Special Requests   Final    NONE Performed at [facility], [address], [place], [place] [address]    Culture MULTIPLE SPECIES PRESENT, SUGGEST RECOLLECTION (A)  Final   Report Status 04/02/2018 FINAL  Final  Surgical pcr screen     Status: None   Collection Time: 04/02/18 12:12 PM  Result Value Ref Range Status   MRSA, PCR NEGATIVE NEGATIVE Final   Staphylococcus aureus NEGATIVE NEGATIVE Final    Comment: (NOTE) The Xpert SA Assay (FDA approved for NASAL specimens in patients 52 years of age and older), is one component  of a comprehensive surveillance program. It is not intended to diagnose infection nor to guide or monitor treatment. Performed at [facility], [place] [address], [place], [place] [address]          Radiology Studies: No results found.      Scheduled Meds: . enoxaparin (LOVENOX) injection  70 mg Subcutaneous Q24H  . gabapentin  200 mg Oral BID  . hydrochlorothiazide  25 mg Oral Daily  . insulin aspart  0-15 Units Subcutaneous Q4H  . lip balm  1 application Topical BID  . methocarbamol  750 mg Oral TID   Continuous Infusions: . sodium chloride 75 mL/hr at 04/04/18 0118  . magnesium sulfate 1 - 4 g bolus IVPB       LOS: 3 days     Cordelia Poche, MD [facility] 04/04/2018, 9:51 AM Pager: [phone]  If 7PM-7AM, please contact night-coverage [email or web address] 04/04/2018, 9:51 AM

## 2018-04-05 LAB — CBC
HCT: 28.4 % — ABNORMAL LOW (ref 36.0–46.0)
Hemoglobin: 8.9 g/dL — ABNORMAL LOW (ref 12.0–15.0)
MCH: 21.4 pg — ABNORMAL LOW (ref 26.0–34.0)
MCHC: 31.3 g/dL (ref 30.0–36.0)
MCV: 68.3 fL — ABNORMAL LOW (ref 78.0–100.0)
Platelets: 378 10*3/uL (ref 150–400)
RBC: 4.16 MIL/uL (ref 3.87–5.11)
RDW: 18.1 % — ABNORMAL HIGH (ref 11.5–15.5)
WBC: 11.8 10*3/uL — ABNORMAL HIGH (ref 4.0–10.5)

## 2018-04-05 LAB — COMPREHENSIVE METABOLIC PANEL
ALT: 365 U/L — ABNORMAL HIGH (ref 14–54)
AST: 175 U/L — ABNORMAL HIGH (ref 15–41)
Albumin: 2.5 g/dL — ABNORMAL LOW (ref 3.5–5.0)
Alkaline Phosphatase: 274 U/L — ABNORMAL HIGH (ref 38–126)
Anion gap: 9 (ref 5–15)
BUN: <5 mg/dL — ABNORMAL LOW (ref 6–20)
CO2: 25 mmol/L (ref 22–32)
Calcium: 8 mg/dL — ABNORMAL LOW (ref 8.9–10.3)
Chloride: 101 mmol/L (ref 101–111)
Creatinine, Ser: 0.75 mg/dL (ref 0.44–1.00)
GFR calc Af Amer: >60 mL/min (ref >60)
GFR calc non Af Amer: >60 mL/min (ref >60)
Glucose, Bld: 170 mg/dL — ABNORMAL HIGH (ref 65–99)
Potassium: 3.5 mmol/L (ref 3.5–5.1)
Sodium: 135 mmol/L (ref 135–145)
Total Bilirubin: 1.6 mg/dL — ABNORMAL HIGH (ref 0.3–1.2)
Total Protein: 6.2 g/dL — ABNORMAL LOW (ref 6.5–8.1)

## 2018-04-05 LAB — GLUCOSE, CAPILLARY
Glucose-Capillary: 160 mg/dL — ABNORMAL HIGH (ref 65–99)
Glucose-Capillary: 133 mg/dL — ABNORMAL HIGH (ref 65–99)
Glucose-Capillary: 166 mg/dL — ABNORMAL HIGH (ref 65–99)
Glucose-Capillary: 143 mg/dL — ABNORMAL HIGH (ref 65–99)

## 2018-04-05 MED ORDER — WHITE PETROLATUM EX OINT
TOPICAL_OINTMENT | CUTANEOUS | Status: AC
  Administered 2018-04-06: 05:00:00
  Filled 2018-04-05: qty 28.35

## 2018-04-05 NOTE — Progress Notes (Signed)
Patient ID: Cynthia Rosales, female   DOB: [birth year], 37 y.o.   MRN: [id number]    2 Days Post-Op  Subjective: Patient just got back in bed from the chair.  Said she had some emesis after supper last night, but was able to eat a small amount for breakfast this am.  She is still having a lot of pain.  She does pull 1250 on her IS.  Objective: Vital signs in last 24 hours: Temp:  [98.4 F (36.9 C)-99.2 F (37.3 C)] 98.5 F (36.9 C) (05/07 0538) Pulse Rate:  [86-91] 90 (05/07 0538) Resp:  [18] 18 (05/06 1620) BP: (103-134)/(49-81) 103/49 (05/07 0538) SpO2:  [95 %-98 %] 95 % (05/07 0538) Weight:  [138.3 kg (304 lb 14.3 oz)] 138.3 kg (304 lb 14.3 oz) (05/06 2034) Last BM Date: 03/29/18  Intake/Output from previous day: 05/06 0701 - 05/07 0700 In: 1441.3 [P.O.:240; I.V.:1201.3] Out: 8563 [Urine:3600; Drains:50] Intake/Output this shift: No intake/output data recorded.  PE: Heart: regular Lungs: CTAB Abd: soft, morbidly obese, some BS, all incisions are c/d/i with steri-strips.  JP drain with serosang output.  No evidence of bilious output at this time. 50cc out yesterday  Lab Results:  Recent Labs    04/04/18 0421 04/05/18 0644  WBC 11.9* 11.8*  HGB 9.5* 8.9*  HCT 29.9* 28.4*  PLT 392 378   BMET Recent Labs    04/04/18 0421 04/05/18 0644  NA 135 135  K 3.7 3.5  CL 102 101  CO2 24 25  GLUCOSE 177* 170*  BUN <5* <5*  CREATININE 0.78 0.75  CALCIUM 7.9* 8.0*   PT/INR No results for input(s): LABPROT, INR in the last 72 hours. CMP     Component Value Date/Time   NA 135 04/05/2018 0644   NA 140 11/19/2014 0933   K 3.5 04/05/2018 0644   CL 101 04/05/2018 0644   CO2 25 04/05/2018 0644   GLUCOSE 170 (H) 04/05/2018 0644   BUN <5 (L) 04/05/2018 0644   BUN 10 11/19/2014 0933   CREATININE 0.75 04/05/2018 0644   CALCIUM 8.0 (L) 04/05/2018 0644   PROT 6.2 (L) 04/05/2018 0644   PROT 6.7 11/19/2014 0933   ALBUMIN 2.5 (L) 04/05/2018 0644   ALBUMIN 3.3 (L) 11/19/2014  0933   AST 175 (H) 04/05/2018 0644   ALT 365 (H) 04/05/2018 0644   ALKPHOS 274 (H) 04/05/2018 0644   BILITOT 1.6 (H) 04/05/2018 0644   GFRNONAA >60 04/05/2018 0644   GFRAA >60 04/05/2018 0644   Lipase     Component Value Date/Time   LIPASE 24 04/03/2018 0549       Studies/Results: No results found.  Anti-infectives: Anti-infectives (From admission, onward)   Start     Dose/Rate Route Frequency Ordered Stop   04/04/18 0800  cefTRIAXone (ROCEPHIN) 2 g in sodium chloride 0.9 % 100 mL IVPB  Status:  Discontinued    Note to Pharmacy:  Pharmacy may adjust dosing strength / duration / interval for maximal efficacy   2 g 200 mL/hr over 30 Minutes Intravenous Every 24 hours 04/03/18 1155 04/03/18 1317   04/03/18 0715  cefTRIAXone (ROCEPHIN) 2 g in sodium chloride 0.9 % 100 mL IVPB  Status:  Discontinued     2 g 200 mL/hr over 30 Minutes Intravenous On call to O.R. 04/02/18 1021 04/03/18 1126   04/03/18 0715  metroNIDAZOLE (FLAGYL) IVPB 500 mg  Status:  Discontinued     500 mg 100 mL/hr over 60 Minutes Intravenous On call to O.R.  04/02/18 1021 04/03/18 1126   04/02/18 1200  cefTRIAXone (ROCEPHIN) 2 g in sodium chloride 0.9 % 100 mL IVPB  Status:  Discontinued    Note to Pharmacy:  Pharmacy may adjust dosing strength / duration / interval for maximal efficacy   2 g 200 mL/hr over 30 Minutes Intravenous Every 24 hours 04/02/18 1019 04/03/18 1155   04/02/18 1100  ceFEPIme (MAXIPIME) 1 g in sodium chloride 0.9 % 100 mL IVPB  Status:  Discontinued     1 g 200 mL/hr over 30 Minutes Intravenous Every 8 hours 04/02/18 1000 04/02/18 1019   04/02/18 1100  metroNIDAZOLE (FLAGYL) IVPB 500 mg  Status:  Discontinued     500 mg 100 mL/hr over 60 Minutes Intravenous Every 8 hours 04/02/18 1000 04/03/18 1131       Assessment/Plan Acute cholecystitis, POD 2, s/p lap chole (staple across infundibulum due to extreme inflammation) -LFTs stable to trending down.  Will recheck in am -patient needs  to mobilize in hallways TID -cont IS use -she's on oxy 30 q 6 hrs, robaxin, and gabapentin for pain control. -cont to follow  FEN - heart healthy diet VTE - SCDs/Lovenox (70mg ) ID - none currently   LOS: 4 days    Henreitta Cea , [facility] 04/05/2018, 9:52 AM Pager: [phone]

## 2018-04-05 NOTE — Progress Notes (Signed)
PROGRESS NOTE    Cynthia Rosales  ZOX:[id number] DOB: 06/06/1981 DOA: 04/01/2018 PCP: Medicine, [facility]   Brief Narrative: Cynthia Rosales is a 37 y.o. female with a history of sickle cell disease, diabetes mellitus, hypertension, depression.  Patient presented with abdominal pain, nausea, and vomiting.  Initial concern for acute hepatitis, secondary to unremarkable CT scan.  Right upper quadrant ultrasound was obtained and was significant for acute cholecystitis. S/p laparoscopic cholecystectomy with drain.   Assessment & Plan:   Principal Problem:   Acute cholecystitis s/p lap cholecystectomy 04/03/2018 Active Problems:   Diabetes mellitus ([place])   HTN (hypertension), benign   Sickle cell disease without crisis ([place])   Morbid obesity with body mass index (BMI) of 50.0 to 59.9 in adult [facility])   Mass of uterus   Transaminitis   Acute cholecystitis Patient is s/p laparoscopic cholecystectomy on 5/5. Unable to perform cholangiogram. Alk phos, AST, ALT, bilirubin trended up after surgery, now improved slightly.  -General surgery recommendations: advance diet as tolerated -Continue home oxycodone while able to take by mouth -Repeat CMP in AM  -Drain per surgery  Diabetes mellitus Uncontrolled, insulin-dependent, hyperglycemia.  Patient on 70/30 as an outpatient. -Continue 70/30 at reduced dose of 20 units BID; as appetite improves, will restart home dose.  Sickle cell disease Patient currently not in crisis.  Hemoglobin down to 9 which is close at the patient's baseline.  High threshold for transfusion.  Morbid obesity Body mass index is 52.34 kg/m.   Back pain Chronic. Not related to pain crisis per patient. Hemoglobin stable. -Continuing home Oxycodone 30 mg q6 hours prn (confirmed via [place] on 5/6)  Uterine mass Seen on CT. Likely fibroid. Outpatient management.  Essential hypertension -Continue hydrochlorothiazide  Anxiety -Continue  diazepam   DVT prophylaxis: SCDs Code Status:   Code Status: Full Code Family Communication: Son at bedside Disposition Plan: Anticipate discharge home in 24 hours pending improvement of lab values and general surgery recommendations   Consultants:   General surgery  Procedures:   None  Antimicrobials:  Ceftriaxone (5/5)  Flagyl (5/5)   Subjective: Pain.  Objective: Vitals:   04/04/18 1620 04/04/18 2034 04/05/18 0538 04/05/18 0900  BP: 134/74 129/81 (!) 103/49 134/86  Pulse: 86 91 90 66  Resp: 18   20  Temp: 98.4 F (36.9 C) 99.2 F (37.3 C) 98.5 F (36.9 C) 98.6 F (37 C)  TempSrc: Oral Oral Oral Oral  SpO2: 98% 98% 95% 100%  Weight:  (!) 138.3 kg (304 lb 14.3 oz)    Height:        Intake/Output Summary (Last 24 hours) at 04/05/2018 1041 Last data filed at 04/05/2018 0900 Gross per 24 hour  Intake 1561.25 ml  Output 4470 ml  Net -2908.75 ml   Filed Weights   04/01/18 2027 04/04/18 2034  Weight: (!) 138.4 kg (305 lb 1.9 oz) (!) 138.3 kg (304 lb 14.3 oz)    Examination:  General exam: Appears calm and comfortable. Respiratory system: Clear to auscultation. Respiratory effort normal. Cardiovascular system: S1 & S2 heard, RRR. No murmurs, rubs, gallops or clicks. Gastrointestinal system: Abdomen is nondistended, soft and non-tender. Incisions appear intact without drainage. No organomegaly or masses felt. Normal bowel sounds heard. Central nervous system: Alert and oriented. No focal neurological deficits. Extremities: No edema. No calf tenderness Skin: No cyanosis. No rashes Psychiatry: Judgement and insight appear normal. Mood & affect appropriate.     Data Reviewed: I have personally reviewed following labs and  imaging studies  CBC: Recent Labs  Lab 04/01/18 1129 04/01/18 1153 04/02/18 0631 04/03/18 0549 04/04/18 0421 04/05/18 0644  WBC 12.5*  --  11.3* 10.3 11.9* 11.8*  NEUTROABS 10.8*  --   --   --   --   --   HGB 11.3* 13.6 9.0* 9.6*  9.5* 8.9*  HCT 35.3* 40.0 28.2* 30.6* 29.9* 28.4*  MCV 68.3*  --  68.0* 68.0* 68.0* 68.3*  PLT 395  --  351 386 392 373   Basic Metabolic Panel: Recent Labs  Lab 04/01/18 1129 04/01/18 1153 04/01/18 1914 04/02/18 0631 04/03/18 0549 04/04/18 0421 04/04/18 0715 04/05/18 0644  NA 136 136  --  132* 136 135  --  135  K 4.0 4.2  --  3.6 3.5 3.7  --  3.5  CL 99* 101  --  97* 100* 102  --  101  CO2 20*  --   --  '24 25 24  '$ --  25  GLUCOSE 268* 270*  --  285* 135* 177*  --  170*  BUN 9 10  --  8 <5* <5*  --  <5*  CREATININE 0.97 0.80  --  1.00 0.81 0.78  --  0.75  CALCIUM 9.2  --   --  8.1* 8.2* 7.9*  --  8.0*  MG  --   --  1.6*  --  1.5*  --  1.4*  --   PHOS  --   --  3.5  --   --   --   --   --    GFR: Estimated Creatinine Clearance: 131.3 mL/min (by C-G formula based on SCr of 0.75 mg/dL). Liver Function Tests: Recent Labs  Lab 04/01/18 1129 04/02/18 0631 04/03/18 0549 04/04/18 0421 04/05/18 0644  AST 679* 340* 155* 309* 175*  ALT 850* 604* 460* 470* 365*  ALKPHOS 298* 255* 238* 283* 274*  BILITOT 2.7* 2.3* 1.1 2.6* 1.6*  PROT 8.2* 6.6 6.7 6.6 6.2*  ALBUMIN 3.4* 2.6* 2.7* 2.6* 2.5*   Recent Labs  Lab 04/01/18 1129 04/02/18 0631 04/03/18 0549  LIPASE '28 30 24   '$ No results for input(s): AMMONIA in the last 168 hours. Coagulation Profile: Recent Labs  Lab 04/01/18 1914  INR 1.06   Cardiac Enzymes: Recent Labs  Lab 04/01/18 1643  CKTOTAL 171   BNP (last 3 results) No results for input(s): PROBNP in the last 8760 hours. HbA1C: No results for input(s): HGBA1C in the last 72 hours. CBG: Recent Labs  Lab 04/04/18 0818 04/04/18 1139 04/04/18 1709 04/04/18 2031 04/05/18 0735  GLUCAP 174* 179* 168* 185* 160*   Lipid Profile: No results for input(s): CHOL, HDL, LDLCALC, TRIG, CHOLHDL, LDLDIRECT in the last 72 hours. Thyroid Function Tests: No results for input(s): TSH, T4TOTAL, FREET4, T3FREE, THYROIDAB in the last 72 hours. Anemia Panel: No results for  input(s): VITAMINB12, FOLATE, FERRITIN, TIBC, IRON, RETICCTPCT in the last 72 hours. Sepsis Labs: No results for input(s): PROCALCITON, LATICACIDVEN in the last 168 hours.  Recent Results (from the past 240 hour(s))  Urine culture     Status: Abnormal   Collection Time: 04/01/18  3:48 PM  Result Value Ref Range Status   Specimen Description URINE, CLEAN CATCH  Final   Special Requests   Final    NONE Performed at [facility], [address], [place], [place] [address]    Culture MULTIPLE SPECIES PRESENT, SUGGEST RECOLLECTION (A)  Final   Report Status 04/02/2018 FINAL  Final  Surgical pcr screen     Status: None   Collection Time: 04/02/18 12:12 PM  Result Value Ref Range Status   MRSA, PCR NEGATIVE NEGATIVE Final   Staphylococcus aureus NEGATIVE NEGATIVE Final    Comment: (NOTE) The Xpert SA Assay (FDA approved for NASAL specimens in patients 30 years of age and older), is one component of a comprehensive surveillance program. It is not intended to diagnose infection nor to guide or monitor treatment. Performed at [facility], [place] [address], [place], [place] [address]          Radiology Studies: No results found.      Scheduled Meds: . enoxaparin (LOVENOX) injection  70 mg Subcutaneous Q24H  . gabapentin  200 mg Oral BID  . hydrochlorothiazide  25 mg Oral Daily  . insulin aspart protamine- aspart  20 Units Subcutaneous BID WC  . lip balm  1 application Topical BID  . methocarbamol  750 mg Oral TID   Continuous Infusions: . sodium chloride 75 mL/hr at 04/04/18 1514     LOS: 4 days     Cordelia Poche, MD [facility] 04/05/2018, 10:41 AM Pager: [phone]  If 7PM-7AM, please contact night-coverage [email or web address] 04/05/2018, 10:41 AM

## 2018-04-05 NOTE — Progress Notes (Signed)
Physical Therapy Treatment Patient Details Name: Cynthia Rosales MRN: [id number] DOB: 01/05/1981 Today's Date: 04/05/2018    History of Present Illness Pt is a 37 y/o female admitted secondary to abdominal pain, nausea and vomiting. [place] of abdomen suggested acute cholecystitis. Pt s/p laparascopic cholecystectomy on 5/5. PMH includes HTN, DM, sickle cell disease, obesity, and chronic pain.     PT Comments    Continuing work on functional mobility and activity tolerance;  Making good progress with gait distance, and encouraged Cynthia Rosales to walk the hallways again tonight; RW is beneficial for stabiltiy and activity tolerance with amb   Follow Up Recommendations  Home health PT;Supervision for mobility/OOB     Equipment Recommendations  Rolling walker with 5" wheels;3in1 (PT)(Wide)    Recommendations for Other Services       Precautions / Restrictions Precautions Precautions: Fall Precaution Comments: slight fall risk    Mobility  Bed Mobility Overal bed mobility: Needs Assistance Bed Mobility: Supine to Sit;Sit to Supine     Supine to sit: Supervision Sit to supine: Min assist   General bed mobility comments: Slow moving, used rails, but no need for phsyical assist to get up; min assist to help LEs back into bed  Transfers Overall transfer level: Needs assistance Equipment used: Rolling walker (2 wheeled) Transfers: Sit to/from Stand Sit to Stand: Min guard         General transfer comment: Min guard for safety.   Ambulation/Gait Ambulation/Gait assistance: Min guard;Supervision Ambulation Distance (Feet): 110 Feet Assistive device: Rolling walker (2 wheeled) Gait Pattern/deviations: Decreased step length - right;Decreased step length - left;Decreased stride length;Wide base of support     General Gait Details: Cues for RW use, and encouraged pt to walk the hallways another time during night shift; short steps   Stairs             Wheelchair Mobility     Modified Rankin (Stroke Patients Only)       Balance     Sitting balance-Leahy Scale: Good     Standing balance support: Bilateral upper extremity supported;During functional activity Standing balance-Leahy Scale: Poor Standing balance comment: Reliant on BUE support.                             Cognition Arousal/Alertness: Awake/alert Behavior During Therapy: WFL for tasks assessed/performed Overall Cognitive Status: Within Functional Limits for tasks assessed                                        Exercises      General Comments        Pertinent Vitals/Pain Pain Assessment: 0-10 Pain Score: 10-Worst pain ever Pain Location: stomach  Pain Descriptors / Indicators: Aching Pain Intervention(s): Monitored during session;Premedicated before session    Home Living                      Prior Function            PT Goals (current goals can now be found in the care plan section) Acute Rehab PT Goals Patient Stated Goal: to decrease pain  PT Goal Formulation: With patient Time For Goal Achievement: 04/18/18 Potential to Achieve Goals: Good Progress towards PT goals: Progressing toward goals    Frequency    Min 3X/week      PT Plan Current  plan remains appropriate    Co-evaluation              AM-PAC PT "6 Clicks" Daily Activity  Outcome Measure  Difficulty turning over in bed (including adjusting bedclothes, sheets and blankets)?: A Little Difficulty moving from lying on back to sitting on the side of the bed? : A Little Difficulty sitting down on and standing up from a chair with arms (e.g., wheelchair, bedside commode, etc,.)?: A Little Help needed moving to and from a bed to chair (including a wheelchair)?: A Little Help needed walking in hospital room?: A Little Help needed climbing 3-5 steps with a railing? : A Lot 6 Click Score: 17    End of Session   Activity Tolerance: Patient tolerated treatment  well Patient left: in bed;with call bell/phone within reach Nurse Communication: Mobility status PT Visit Diagnosis: Other abnormalities of gait and mobility (R26.89);Pain Pain - part of body: (abdomen)     Time: 2426-8341 PT Time Calculation (min) (ACUTE ONLY): 25 min  Charges:  $Gait Training: 23-37 mins                    G Codes:       Roney Marion, PT  Acute Rehabilitation Services Pager [phone] Office [place] 04/05/2018, 4:14 PM

## 2018-04-05 NOTE — Progress Notes (Signed)
OT Cancellation Note  Patient Details Name: Paquita Printy MRN: [id number] DOB: 21-Jun-1981   Cancelled Treatment:    Reason Eval/Treat Not Completed: Pain limiting ability to participate. Pt reports 10/10 abdominal pain in early afternoon and requests OT return later. OT will check back later as time allows or next available time  Britt Bottom 04/05/2018, 1:50 PM

## 2018-04-06 LAB — COMPREHENSIVE METABOLIC PANEL
ALT: 273 U/L — ABNORMAL HIGH (ref 14–54)
AST: 97 U/L — ABNORMAL HIGH (ref 15–41)
Albumin: 2.2 g/dL — ABNORMAL LOW (ref 3.5–5.0)
Alkaline Phosphatase: 218 U/L — ABNORMAL HIGH (ref 38–126)
Anion gap: 7 (ref 5–15)
BUN: 6 mg/dL (ref 6–20)
CO2: 27 mmol/L (ref 22–32)
Calcium: 8 mg/dL — ABNORMAL LOW (ref 8.9–10.3)
Chloride: 102 mmol/L (ref 101–111)
Creatinine, Ser: 0.91 mg/dL (ref 0.44–1.00)
GFR calc Af Amer: >60 mL/min (ref >60)
GFR calc non Af Amer: >60 mL/min (ref >60)
Glucose, Bld: 243 mg/dL — ABNORMAL HIGH (ref 65–99)
Potassium: 3.1 mmol/L — ABNORMAL LOW (ref 3.5–5.1)
Sodium: 136 mmol/L (ref 135–145)
Total Bilirubin: 0.9 mg/dL (ref 0.3–1.2)
Total Protein: 5.7 g/dL — ABNORMAL LOW (ref 6.5–8.1)

## 2018-04-06 LAB — CBC
HCT: 27.5 % — ABNORMAL LOW (ref 36.0–46.0)
Hemoglobin: 8.5 g/dL — ABNORMAL LOW (ref 12.0–15.0)
MCH: 21.3 pg — ABNORMAL LOW (ref 26.0–34.0)
MCHC: 30.9 g/dL (ref 30.0–36.0)
MCV: 68.9 fL — ABNORMAL LOW (ref 78.0–100.0)
Platelets: 389 10*3/uL (ref 150–400)
RBC: 3.99 MIL/uL (ref 3.87–5.11)
RDW: 18.7 % — ABNORMAL HIGH (ref 11.5–15.5)
WBC: 13.3 10*3/uL — ABNORMAL HIGH (ref 4.0–10.5)

## 2018-04-06 LAB — GLUCOSE, CAPILLARY
Glucose-Capillary: 239 mg/dL — ABNORMAL HIGH (ref 65–99)
Glucose-Capillary: 165 mg/dL — ABNORMAL HIGH (ref 65–99)
Glucose-Capillary: 176 mg/dL — ABNORMAL HIGH (ref 65–99)

## 2018-04-06 MED ORDER — HYDROCHLOROTHIAZIDE 12.5 MG PO TABS: 12.5000 mg | ORAL_TABLET | Freq: Every day | ORAL | 1 refills | Status: DC

## 2018-04-06 MED ORDER — DIAZEPAM 5 MG PO TABS
5.0000 mg | ORAL_TABLET | Freq: Three times a day (TID) | ORAL | Status: DC
  Administered 2018-04-06: 5 mg via ORAL
  Filled 2018-04-06: qty 1

## 2018-04-06 MED ORDER — POTASSIUM CHLORIDE CRYS ER 20 MEQ PO TBCR
40.0000 meq | EXTENDED_RELEASE_TABLET | Freq: Once | ORAL | Status: AC
  Administered 2018-04-06: 40 meq via ORAL
  Filled 2018-04-06: qty 2

## 2018-04-06 MED ORDER — HYDROCHLOROTHIAZIDE 25 MG PO TABS: 12.5000 mg | ORAL_TABLET | Freq: Every day | ORAL | Status: DC

## 2018-04-06 MED ORDER — KETOROLAC TROMETHAMINE 30 MG/ML IJ SOLN
30.0000 mg | Freq: Three times a day (TID) | INTRAMUSCULAR | Status: DC
  Administered 2018-04-06: 30 mg via INTRAVENOUS
  Filled 2018-04-06: qty 1

## 2018-04-06 NOTE — Discharge Summary (Signed)
Cynthia Rosales, is a 37 y.o. female  DOB 24-Mar-1981  MRN [id number].  Admission date:  04/01/2018  Admitting Physician  Bonnell Public, MD  Discharge Date:  04/06/2018   Primary MD  [facility]  Recommendations for primary care physician for things to follow:  1) please follow postoperative instructions, lifting restrictions in order activity restrictions as advised by the surgical team 2) HCTZ has been decreased to 12.5 mg daily from 25 mg daily due to risk of dehydration and low potassium 3) follow-up with the primary care doctor in about a week for repeat liver function test, repeat complete blood count and kidney test (CMP and CBC test)   Admission Diagnosis  RUQ pain [R10.11] Elevated LFTs [R94.5]   Discharge Diagnosis  RUQ pain [R10.11] Elevated LFTs [R94.5]    Principal Problem:   Acute cholecystitis s/p lap cholecystectomy 04/03/2018 Active Problems:   Diabetes mellitus ([place])   HTN (hypertension), benign   Sickle cell disease without crisis ([place])   Morbid obesity with body mass index (BMI) of 50.0 to 59.9 in adult [facility])   Mass of uterus   Transaminitis      Past Medical History:  Diagnosis Date  . Abscess of bursa, left elbow 06/2013   Treated with I and D/antibiotics.   . Anemia   . Asthma   . Depression   . History of blood transfusion    "after I had one of my kids"  . Hypertension   . Ovarian cyst   . Renal disorder    kidney stones  . Sickle cell disease ([place])   . Type II diabetes mellitus ([place])     Past Surgical History:  Procedure Laterality Date  . APPENDECTOMY  2013  . CARPAL TUNNEL RELEASE    . CESAREAN SECTION  2000; 2007; 2011  . CHOLECYSTECTOMY N/A 04/03/2018   Procedure: LAPAROSCOPIC CHOLECYSTECTOMY;  Surgeon: Rolm Bookbinder, MD;  Location: [place];  Service: General;  Laterality: N/A;  . DILATION AND CURETTAGE OF UTERUS    .  LAMINECTOMY N/A 12/13/2014   Procedure: CERVICAL LAMINECTOMY FOR INTRADURAL TUMOR;  Surgeon: Charlie Pitter, MD;  Location: [place] [facility];  Service: Neurosurgery;  Laterality: N/A;  posterior  . LAPAROSCOPY N/A 07/20/2017   Procedure: LAPAROSCOPY OPERATIVE WITH WEDGE RESECTION RIGHT CORNUA AND PARTIAL SALPINGECTOMY;  Surgeon: [name], MD;  Location: [place] [facility];  Service: Gynecology;  Laterality: N/A;  . REDUCTION MAMMAPLASTY Bilateral 1998       HPI  from the history and physical done on the day of admission:   Chief Complaint: Nausea, vomiting and abdominal pain.  HPI: Patient is a 37 year old African-American female, morbidly obese, with past medical history significant for diabetes mellitus type 2, documented sickle cell disease, ovarian cyst, hypertension, asthma and depression.  According to the patient, she went for a wedding ceremony 3 days ago where she ate a lot of [place] food.  Same night of the wedding, the patient developed nausea and vomiting.  The patient has been  having persistent nausea and vomiting since then.  According to the patient, she is able to keep anything down.  There is associated epigastric and right-sided abdominal pain, as well as generalized abdominal pain.  Patient tells me that her boyfriend feels that her body is warm, but she definitely reported associated chills.  No headache, no neck pain, no urinary symptoms.  The patient tells me that she feels her chest hurts when she breathes.  On presentation to the ER, CT scan of the abdomen and pelvis done was nonrevealing.  CMP revealed elevated AST, ALT and alkaline phosphatase.  Bilirubin is also elevated.  Patient will be admitted for further assessment and management.  ED Course: On presentation to the ER, CMP done revealed normal potassium (4), CO2 of 20, BUN of 9 and creatinine of 0.97, AST of 679 and AST of 850.  Total bilirubin is 2.7.  CT scan of the abdomen and pelvis did not reveal any acute abnormality, a new  4.8 cm mass in the left side of the uterus, most likely a fibroid was reported.  The patient has been worked up for possible acute hepatitis.  The patient is managed supportively.  The GI team is been consulted.   Pertinent labs: As above. EKG: Independently reviewed.  Minimal tachycardia 101 bpm. Imaging: independently reviewed.         Hospital Course:  Principal Problem:   Acute cholecystitis s/p lap cholecystectomy 04/03/2018 Active Problems:   Diabetes mellitus ([place])   HTN (hypertension), benign   Sickle cell disease without crisis ([place])   Morbid obesity with body mass index (BMI) of 50.0 to 59.9 in adult [facility])   Mass of uterus   Transaminitis    Brief Narrative: Cynthia Rosales is a 37 y.o. female with a history of sickle cell disease, diabetes mellitus, hypertension, depression.  Patient presented with abdominal pain, nausea, and vomiting.  Initial concern for acute hepatitis, secondary to unremarkable CT scan.  Right upper quadrant ultrasound was obtained and was significant for acute cholecystitis. S/p laparoscopic cholecystectomy with drain.   Assessment & Plan:   1)Acute cholecystitis- Patient is s/p laparoscopic cholecystectomy on 04/03/18. Unable to perform cholangiogram. Alk phos, AST, ALT, bilirubin trended up after surgery, now trending down, tolerating solid food well, ambulating,, had bowel movement,, no fever no chills no nausea no vomiting.  Follow instructions as per surgical team, surgical team okay with discharge home on 04/06/2018 Repeat complete metabolic profile with PCP within a week as outpatient, postsurgical drain to be removed by surgical team   2)Diabetes mellitus- Uncontrolled, insulin-dependent resume home increasing regimen  3)Sickle cell disease- Patient currently not in crisis.  Hemoglobin trending down due to hydration in the postop patient.  High threshold for transfusion.  4)Morbid obesity- Body mass index is 52.34 kg/m.   5) chronic back  pain- Chronic. Not related to pain crisis per patient.-Continuing home Oxycodone 30 mg q6 hours prn (confirmed via [place] on 04/04/18)  6)Uterine mass- Seen on CT. Likely fibroid.  Patient reminded to follow-up as outpatient with PCP  7)Essential hypertension- - decrease HCTZ to 12.5 mg daily due to concerns about hypokalemia and dehydration hydrochlorothiazide  8)Anxiety- -Continue diazepam at home regimen   Code Status:   Code Status: Full Code Disposition Plan:  Per surgical service okay to discharge home on 04/06/2018   Consultants:   General surgery  Procedures:   Lap chole  Antimicrobials:  Ceftriaxone (5/5)  Flagyl (5/5)   Discharge Condition: Stable  Follow UP  Follow-up Information    [facility], [facility] Follow up.   Contact information: [place] [address] [phone]        [facility] Follow up.   Specialty:  [place] Why:  They will call you to set up initial visit Contact information: [place] [address] [place], [place]. Call.   Specialty:  General Surgery Why:  We are working on your appointment, please call to confirm. Please arrive 30 minutes prior to your appointment to check in and fill out paperwork. Bring photo ID and insurance information. Contact information: [address] [place] [phone]          Diet and Activity recommendation:  As advised  Discharge Instructions     Discharge Instructions    Call MD for:  difficulty breathing, headache or visual disturbances   Complete by:  As directed    Call MD for:  persistant dizziness or light-headedness   Complete by:  As directed    Call MD for:  persistant nausea and vomiting   Complete by:  As directed    Call MD for:  redness, tenderness, or signs of infection (pain, swelling, redness, odor or  green/yellow discharge around incision site)   Complete by:  As directed    Call MD for:  severe uncontrolled pain   Complete by:  As directed    Call MD for:  temperature >100.4   Complete by:  As directed    Diet - low sodium heart healthy   Complete by:  As directed    Diet Carb Modified   Complete by:  As directed    Discharge instructions   Complete by:  As directed    1) please follow postoperative instructions, lifting restrictions in order activity restrictions as advised by the surgical team 2) HCTZ has been decreased to 12.5 mg daily from 25 mg daily due to risk of dehydration and low potassium 3) follow-up with the primary care doctor in about a week for repeat liver function test, repeat complete blood count and kidney test (CMP and CBC test)   Increase activity slowly   Complete by:  As directed         Discharge Medications     Allergies as of 04/06/2018      Reactions   Amoxicillin Hives, Rash, Other (See Comments)   PATIENT HAS HAD A PCN REACTION WITH IMMEDIATE RASH, FACIAL/TONGUE/THROAT SWELLING, SOB, OR LIGHTHEADEDNESS WITH HYPOTENSION:  #  #  YES  #  #  Has patient had a PCN reaction causing severe rash involving mucus membranes or skin necrosis: No Has patient had a PCN reaction that required hospitalization: No  Has patient had a PCN reaction occurring within the last 10 years: #  #  #  YES  #  #  #  If all of the above answers are "NO", then may proceed with Cephalosporin use.   Buprenorphine Hcl Itching, Nausea Only   Morphine And Related Itching, Nausea Only      Medication List    STOP taking these medications   naproxen 500 MG tablet Commonly known as:  NAPROSYN     TAKE these medications   albuterol 108 (90 Base) MCG/ACT inhaler Commonly known as:  PROVENTIL HFA;VENTOLIN HFA Inhale 2 puffs into the lungs every 6 (six) hours as needed for wheezing or shortness  of breath.   CONCEPT OB 130-92.4-1 MG Caps Take 1 capsule by mouth daily.     diazepam 5 MG tablet Commonly known as:  VALIUM take 1 tablet BY MOUTH THREE TIMES DAILY AS NEEDED What changed:  additional instructions   hydrochlorothiazide 12.5 MG tablet Commonly known as:  HYDRODIURIL Take 1 tablet (12.5 mg total) by mouth daily. What changed:    medication strength  how much to take   NOVOLIN 70/30 (70-30) 100 UNIT/ML injection Generic drug:  insulin NPH-regular Human Inject 35-36 Units into the skin See admin instructions. Inject 35 units subcutaneously after breakfast and 36 units after supper   oxycodone 30 MG immediate release tablet Commonly known as:  ROXICODONE Take 30 mg by mouth every 6 (six) hours as needed for pain.   sitaGLIPtin 100 MG tablet Commonly known as:  JANUVIA Take 100 mg by mouth daily.       Major procedures and Radiology Reports - PLEASE review detailed and final reports for all details, in brief -    Ct Abdomen Pelvis W Contrast  Result Date: 04/01/2018 CLINICAL DATA:  Left-sided abdominal pain with nausea and vomiting. EXAM: CT ABDOMEN AND PELVIS WITH CONTRAST TECHNIQUE: Multidetector CT imaging of the abdomen and pelvis was performed using the standard protocol following bolus administration of intravenous contrast. CONTRAST:  143m OMNIPAQUE IOHEXOL 300 MG/ML  SOLN COMPARISON:  CT scan dated 06/19/2014 FINDINGS: Lower chest: No acute abnormality. Hepatobiliary: No focal liver abnormality is seen. No gallstones, gallbladder wall thickening, or biliary dilatation. Pancreas: Unremarkable. No pancreatic ductal dilatation or surrounding inflammatory changes. Spleen: Normal in size without focal abnormality. Adrenals/Urinary Tract: Adrenal glands are unremarkable. Kidneys are normal, without renal calculi, focal lesion, or hydronephrosis. Bladder is unremarkable. Stomach/Bowel: Stomach is within normal limits. Appendix has been removed. No evidence of bowel wall thickening, distention, or inflammatory changes. Vascular/Lymphatic: No  significant vascular findings are present. No enlarged abdominal or pelvic lymph nodes. Reproductive: New 4.8 cm mass in left side of the uterus, statistically most likely a fibroid. No adnexal masses. Other: No abdominal wall hernia or abnormality. No abdominopelvic ascites. Musculoskeletal: No acute or significant osseous findings. IMPRESSION: No acute abnormalities of the abdomen or pelvis. New 4.8 cm mass in the left side of the uterus, most likely a fibroid. Electronically Signed   By: JLorriane ShireM.D.   On: 04/01/2018 14:14   [place]domen Limited Ruq  Result Date: 04/01/2018 CLINICAL DATA:  Right upper quadrant pain. EXAM: ULTRASOUND ABDOMEN LIMITED RIGHT UPPER QUADRANT COMPARISON:  CT scan dated 04/01/2018 FINDINGS: Gallbladder: Multiple gallstones. No gallbladder wall thickening. Positive sonographic Murphy's sign. Common bile duct: Diameter: 4 mm, normal. Liver: No focal lesion identified. Within normal limits in parenchymal echogenicity. Portal vein is patent on color Doppler imaging with normal direction of blood flow towards the liver. IMPRESSION: Cholelithiasis. Positive sonographic Murphy's sign. Findings suggest acute cholecystitis. Electronically Signed   By: JLorriane ShireM.D.   On: 04/01/2018 16:32    Micro Results    Recent Results (from the past 240 hour(s))  Urine culture     Status: Abnormal   Collection Time: 04/01/18  3:48 PM  Result Value Ref Range Status   Specimen Description URINE, CLEAN CATCH  Final   Special Requests   Final    NONE Performed at [facility] [address] [place]  [address]   Culture MULTIPLE SPECIES PRESENT, SUGGEST RECOLLECTION (A)  Final   Report Status 04/02/2018 FINAL  Final  Surgical pcr screen     Status: None   Collection Time: 04/02/18 12:12 PM  Result Value Ref Range Status   MRSA, PCR NEGATIVE NEGATIVE Final   Staphylococcus aureus NEGATIVE NEGATIVE Final    Comment: (NOTE) The Xpert SA Assay (FDA approved for  NASAL specimens in patients 59 years of age and older), is one component of a comprehensive surveillance program. It is not intended to diagnose infection nor to guide or monitor treatment. Performed at [facility], [place] [address] [place], [place] [address]        Today   Subjective    Cynthia Rosales today has no complaints, had a bowel movement, eating and drinking okay, ambulating, no fevers no emesis          Patient has been seen and examined prior to discharge   Objective   Blood pressure 125/80, pulse 84, temperature 98.5 F (36.9 C), temperature source Oral, resp. rate 18, height '5\' 4"'$  (1.626 m), weight (!) 138.3 kg (304 lb 14.3 oz), last menstrual period 06/18/2017, SpO2 100 %, unknown if currently breastfeeding.   Intake/Output Summary (Last 24 hours) at 04/06/2018 1541 Last data filed at 04/06/2018 0945 Gross per 24 hour  Intake 2940 ml  Output 1275 ml  Net 1665 ml    Exam Gen:- Awake Alert, morbid obese, in no acute distress HEENT:- [place].AT, No sclera icterus Neck-Supple Neck,No JVD,.  Lungs-  CTAB , no wheezing CV- S1, S2 normal Abd-  +ve B.Sounds, Abd Soft, appropriate postop tenderness, significantly increased truncal adiposity  extremity/Skin:- No  edema,   good pulses Psych-affect is appropriate, oriented x3 Neuro-no new focal deficits, no tremors   Data Review   CBC w Diff:  Lab Results  Component Value Date   WBC 13.3 (H) 04/06/2018   HGB 8.5 (L) 04/06/2018   HCT 27.5 (L) 04/06/2018   PLT 389 04/06/2018   LYMPHOPCT 10 04/01/2018   MONOPCT 3 04/01/2018   EOSPCT 0 04/01/2018   BASOPCT 0 04/01/2018    CMP:  Lab Results  Component Value Date   NA 136 04/06/2018   NA 140 11/19/2014   K 3.1 (L) 04/06/2018   CL 102 04/06/2018   CO2 27 04/06/2018   BUN 6 04/06/2018   BUN 10 11/19/2014   CREATININE 0.91 04/06/2018   PROT 5.7 (L) 04/06/2018   PROT 6.7 11/19/2014   ALBUMIN 2.2 (L) 04/06/2018   ALBUMIN 3.3 (L) 11/19/2014   BILITOT 0.9  04/06/2018   ALKPHOS 218 (H) 04/06/2018   AST 97 (H) 04/06/2018   ALT 273 (H) 04/06/2018  .   Total Discharge time is about 33 minutes  Roxan Hockey M.D on 04/06/2018 at 3:41 PM  [facility]   Office  [phone]  Voice Recognition Viviann Spare dictation system was used to create this note, attempts have been made to correct errors. Please contact the author with questions and/or clarifications.

## 2018-04-06 NOTE — Progress Notes (Signed)
Pt being discharged home via wheelchair with family. Pt alert and oriented x4. VSS. Pt c/o no pain at this time. No signs of respiratory distress. Education complete and care plans resolved. IV removed with catheter intact and pt tolerated well. JP drain d/c with no issues. No further issues at this time. Pt to follow up with PCP. Leanne Chang, RN

## 2018-04-06 NOTE — Evaluation (Signed)
Occupational Therapy Evaluation Patient Details Name: Cynthia Rosales MRN: [id number] DOB: 01-14-1981 Today's Date: 04/06/2018    History of Present Illness Pt is a 37 y/o female admitted secondary to abdominal pain, nausea and vomiting. [place] of abdomen suggested acute cholecystitis. Pt s/p laparascopic cholecystectomy on 5/5. PMH includes HTN, DM, sickle cell disease, obesity, and chronic pain.    Clinical Impression   Pt walks household distances with a rollator. She uses a motorized w/c outdoors. Pt is assisted for ADL and IADL by her family and an aide. Pt likely functioning very close to her baseline. Ambulated in room with supervision and remained up in chair at end of session. No further OT needs.    Follow Up Recommendations  No OT follow up    Equipment Recommendations  None recommended by OT    Recommendations for Other Services       Precautions / Restrictions Precautions Precautions: Fall Precaution Comments: slight fall risk Restrictions Weight Bearing Restrictions: No      Mobility Bed Mobility Overal bed mobility: Modified Independent             General bed mobility comments: HOB up  Transfers Overall transfer level: Needs assistance Equipment used: Rolling walker (2 wheeled) Transfers: Sit to/from Stand Sit to Stand: Supervision         General transfer comment: for safety, line management    Balance Overall balance assessment: Needs assistance   Sitting balance-Leahy Scale: Good       Standing balance-Leahy Scale: Poor Standing balance comment: requires one hand support during standing pericare                           ADL either performed or assessed with clinical judgement   ADL Overall ADL's : At baseline                                       General ADL Comments: Assisted pt with sponge bathing, dressing and pericare.     Vision Baseline Vision/History: No visual deficits Patient Visual Report: No  change from baseline       Perception     Praxis      Pertinent Vitals/Pain Pain Assessment: No/denies pain     Hand Dominance Right   Extremity/Trunk Assessment Upper Extremity Assessment Upper Extremity Assessment: Overall WFL for tasks assessed   Lower Extremity Assessment Lower Extremity Assessment: Defer to PT evaluation   Cervical / Trunk Assessment Cervical / Trunk Assessment: Normal;Other exceptions(obese)   Communication Communication Communication: No difficulties   Cognition Arousal/Alertness: Awake/alert Behavior During Therapy: WFL for tasks assessed/performed Overall Cognitive Status: Within Functional Limits for tasks assessed                                     General Comments       Exercises     Shoulder Instructions      Home Living Family/patient expects to be discharged to:: Private residence Living Arrangements: Children;Spouse/significant other Available Help at Discharge: Family;Available 24 hours/day;Personal care attendant Type of Home: House Home Access: Ramped entrance     Home Layout: One level     Bathroom Shower/Tub: Occupational psychologist: Standard     Home Equipment: Environmental consultant - 4 wheels;Transport planner  Prior Functioning/Environment Level of [place]: Needs assistance  Gait / Transfers Assistance Needed: uses rollator inside and motorized chair outside ADL's / Homemaking Assistance Needed: assist of aide for IADL and LB bathing and dressing            OT Problem List:        OT Treatment/Interventions:      OT Goals(Current goals can be found in the care plan section) Acute Rehab OT Goals Patient Stated Goal: to go home  OT Frequency:     Barriers to D/C:            Co-evaluation              AM-PAC PT "6 Clicks" Daily Activity     Outcome Measure Help from another person eating meals?: None Help from another person taking care of personal grooming?: A  Little Help from another person toileting, which includes using toliet, bedpan, or urinal?: A Little Help from another person bathing (including washing, rinsing, drying)?: A Lot Help from another person to put on and taking off regular upper body clothing?: None Help from another person to put on and taking off regular lower body clothing?: A Lot 6 Click Score: 18   End of Session Equipment Utilized During Treatment: Rolling walker Nurse Communication: (aware of blood on bed pad)  Activity Tolerance: Patient tolerated treatment well Patient left: in chair;with call bell/phone within reach  OT Visit Diagnosis: Unsteadiness on feet (R26.81)                Time: 8295-6213 OT Time Calculation (min): 31 min Charges:  OT General Charges $OT Visit: 1 Visit OT Evaluation $OT Eval Moderate Complexity: 1 Mod OT Treatments $Self Care/Home Management : 8-22 mins G-Codes:     Malka So 04/06/2018, 1:11 PM  04/06/2018 Nestor Lewandowsky, OTR/L Pager: [phone]

## 2018-04-06 NOTE — Discharge Instructions (Addendum)
1)Please follow postoperative instructions, lifting restrictions in order activity restrictions as advised by the surgical team 2) HCTZ has been decreased to 12.5 mg daily from 25 mg daily due to risk of dehydration and low potassium 3)Follow-up with the primary care doctor in about a week for repeat liver function test, repeat complete blood count and kidney test (CMP and CBC test)  Please arrive at least 30 min before your appointment to complete your check in paperwork.  If you are unable to arrive 30 min prior to your appointment time we may have to cancel or reschedule you.  LAPAROSCOPIC SURGERY: POST OP INSTRUCTIONS  1. DIET: Follow a light bland diet the first 24 hours after arrival home, such as soup, liquids, crackers, etc. Be sure to include lots of fluids daily. Avoid fast food or heavy meals as your are more likely to get nauseated. Eat a low fat the next few days after surgery.  2. Take your usually prescribed home medications unless otherwise directed. 3. PAIN CONTROL:  1. Pain is best controlled by a usual combination of three different methods TOGETHER:  1. Ice/Heat 2. Over the counter pain medication 3. Prescription pain medication 2. Most patients will experience some swelling and bruising around the incisions. Ice packs or heating pads (30-60 minutes up to 6 times a day) will help. Use ice for the first few days to help decrease swelling and bruising, then switch to heat to help relax tight/sore spots and speed recovery. Some people prefer to use ice alone, heat alone, alternating between ice & heat. Experiment to what works for you. Swelling and bruising can take several weeks to resolve.  3. It is helpful to take an over-the-counter pain medication regularly for the first few weeks. Choose one of the following that works best for you:  1. Naproxen (Aleve, etc) Two 220mg  tabs twice a day 2. Ibuprofen (Advil, etc) Three 200mg  tabs four times a day (every meal &  bedtime) 3. Acetaminophen (Tylenol, etc) 500-650mg  four times a day (every meal & bedtime) 4. A prescription for pain medication (such as oxycodone, hydrocodone, etc) should be given to you upon discharge. Take your pain medication as prescribed.  1. If you are having problems/concerns with the prescription medicine (does not control pain, nausea, vomiting, rash, itching, etc), please call us [phone] to see if we need to switch you to a different pain medicine that will work better for you and/or control your side effect better. 2. If you need a refill on your pain medication, please contact your pharmacy. They will contact our office to request authorization. Prescriptions will not be filled after 5 pm or on week-ends. 4. Avoid getting constipated. Between the surgery and the pain medications, it is common to experience some constipation. Increasing fluid intake and taking a fiber supplement (such as Metamucil, Citrucel, FiberCon, MiraLax, etc) 1-2 times a day regularly will usually help prevent this problem from occurring. A mild laxative (prune juice, Milk of Magnesia, MiraLax, etc) should be taken according to package directions if there are no bowel movements after 48 hours.  5. Watch out for diarrhea. If you have many loose bowel movements, simplify your diet to bland foods & liquids for a few days. Stop any stool softeners and decrease your fiber supplement. Switching to mild anti-diarrheal medications (Kayopectate, Pepto Bismol) can help. If this worsens or does not improve, please call us. 6. Wash / shower every day. You may shower over the dressings as they are waterproof. Continue  to shower over incision(s) after the dressing is off. If there is glue over the incisions try not to pick it off, let it fall off naturally. 7. Remove your waterproof bandages 2 days after surgery. You may leave the incision open to air. You may replace a dressing/Band-Aid to cover the incision for comfort if you  wish.  8. ACTIVITIES as tolerated:  1. You may resume regular (light) daily activities beginning the next day--such as daily self-care, walking, climbing stairs--gradually increasing activities as tolerated. If you can walk 30 minutes without difficulty, it is safe to try more intense activity such as jogging, treadmill, bicycling, low-impact aerobics, swimming, etc. 2. Save the most intensive and strenuous activity for last such as sit-ups, heavy lifting, contact sports, etc Refrain from any heavy lifting or straining until you are off narcotics for pain control. For the first 2-3 weeks do not lift over 10-15lb.  3. DO NOT PUSH THROUGH PAIN. Let pain be your guide: If it hurts to do something, don't do it. Pain is your body warning you to avoid that activity for another week until the pain goes down. 4. You may drive when you are no longer taking prescription pain medication, you can comfortably wear a seatbelt, and you can safely maneuver your car and apply brakes. 5. You may have sexual intercourse when it is comfortable.  9. FOLLOW UP in our office  1. Please call [facility] at [phone] to set up an appointment to see your surgeon in the office for a follow-up appointment approximately 2-3 weeks after your surgery. 2. Make sure that you call for this appointment the day you arrive home to insure a convenient appointment time.      10. IF YOU HAVE DISABILITY OR FAMILY LEAVE FORMS, BRING THEM TO THE               OFFICE FOR PROCESSING.   WHEN TO CALL us [phone]:  1. Poor pain control 2. Reactions / problems with new medications (rash/itching, nausea, etc)  3. Fever over 101.5 F (38.5 C) 4. Inability to urinate 5. Nausea and/or vomiting 6. Worsening swelling or bruising 7. Continued bleeding from incision. 8. Increased pain, redness, or drainage from the incision  The clinic staff is available to answer your questions during regular business hours (8:30am-5pm). Please dont hesitate to  call and ask to speak to one of our nurses for clinical concerns.  If you have a medical emergency, go to the nearest emergency room or call 911.  A surgeon from [facility] is always on call at the [facility], [address], [address], [place], [place] [address] ?  MAIN: [phone] ? TOLL FREE: [phone] ?  FAX [phone]  [email or web address]  1)Please follow postoperative instructions, lifting restrictions in order activity restrictions as advised by the surgical team 2) HCTZ has been decreased to 12.5 mg daily from 25 mg daily due to risk of dehydration and low potassium 3)Follow-up with the primary care doctor in about a week for repeat liver function test, repeat complete blood count and kidney test (CMP and CBC test)

## 2018-04-06 NOTE — Progress Notes (Signed)
Patient ID: Cynthia Rosales, female   DOB: 08-22-1981, 37 y.o.   MRN: [id number]    3 Days Post-Op  Subjective: Pt still complaining that pain is not well controlled here.  She is trying to eat but still somewhat nauseated.  Did ambulate in the halls yesterday.  Passing flatus.    Objective: Vital signs in last 24 hours: Temp:  [98.1 F (36.7 C)-98.5 F (36.9 C)] 98.5 F (36.9 C) (05/08 0926) Pulse Rate:  [84-88] 84 (05/08 0926) Resp:  [18-20] 18 (05/08 0926) BP: (87-125)/(58-80) 125/80 (05/08 0926) SpO2:  [90 %-100 %] 100 % (05/08 0926) Weight:  [138.3 kg (304 lb 14.3 oz)] 138.3 kg (304 lb 14.3 oz) (05/07 2050) Last BM Date: 03/29/18  Intake/Output from previous day: 05/07 0701 - 05/08 0700 In: 3000 [P.O.:600; I.V.:2400] Out: 2100 [Urine:2100] Intake/Output this shift: No intake/output data recorded.  PE: Abd: soft, tender, but JP drain with just serosang output. +BS, morbidly obese.  All incisions are c/d/i.  Lab Results:  Recent Labs    04/05/18 0644 04/06/18 0425  WBC 11.8* 13.3*  HGB 8.9* 8.5*  HCT 28.4* 27.5*  PLT 378 389   BMET Recent Labs    04/05/18 0644 04/06/18 0425  NA 135 136  K 3.5 3.1*  CL 101 102  CO2 25 27  GLUCOSE 170* 243*  BUN <5* 6  CREATININE 0.75 0.91  CALCIUM 8.0* 8.0*   PT/INR No results for input(s): LABPROT, INR in the last 72 hours. CMP     Component Value Date/Time   NA 136 04/06/2018 0425   NA 140 11/19/2014 0933   K 3.1 (L) 04/06/2018 0425   CL 102 04/06/2018 0425   CO2 27 04/06/2018 0425   GLUCOSE 243 (H) 04/06/2018 0425   BUN 6 04/06/2018 0425   BUN 10 11/19/2014 0933   CREATININE 0.91 04/06/2018 0425   CALCIUM 8.0 (L) 04/06/2018 0425   PROT 5.7 (L) 04/06/2018 0425   PROT 6.7 11/19/2014 0933   ALBUMIN 2.2 (L) 04/06/2018 0425   ALBUMIN 3.3 (L) 11/19/2014 0933   AST 97 (H) 04/06/2018 0425   ALT 273 (H) 04/06/2018 0425   ALKPHOS 218 (H) 04/06/2018 0425   BILITOT 0.9 04/06/2018 0425   GFRNONAA >60 04/06/2018 0425     GFRAA >60 04/06/2018 0425   Lipase     Component Value Date/Time   LIPASE 24 04/03/2018 0549       Studies/Results: No results found.  Anti-infectives: Anti-infectives (From admission, onward)   Start     Dose/Rate Route Frequency Ordered Stop   04/04/18 0800  cefTRIAXone (ROCEPHIN) 2 g in sodium chloride 0.9 % 100 mL IVPB  Status:  Discontinued    Note to Pharmacy:  Pharmacy may adjust dosing strength / duration / interval for maximal efficacy   2 g 200 mL/hr over 30 Minutes Intravenous Every 24 hours 04/03/18 1155 04/03/18 1317   04/03/18 0715  cefTRIAXone (ROCEPHIN) 2 g in sodium chloride 0.9 % 100 mL IVPB  Status:  Discontinued     2 g 200 mL/hr over 30 Minutes Intravenous On call to O.R. 04/02/18 1021 04/03/18 1126   04/03/18 0715  metroNIDAZOLE (FLAGYL) IVPB 500 mg  Status:  Discontinued     500 mg 100 mL/hr over 60 Minutes Intravenous On call to O.R. 04/02/18 1021 04/03/18 1126   04/02/18 1200  cefTRIAXone (ROCEPHIN) 2 g in sodium chloride 0.9 % 100 mL IVPB  Status:  Discontinued    Note to Pharmacy:  Pharmacy  may adjust dosing strength / duration / interval for maximal efficacy   2 g 200 mL/hr over 30 Minutes Intravenous Every 24 hours 04/02/18 1019 04/03/18 1155   04/02/18 1100  ceFEPIme (MAXIPIME) 1 g in sodium chloride 0.9 % 100 mL IVPB  Status:  Discontinued     1 g 200 mL/hr over 30 Minutes Intravenous Every 8 hours 04/02/18 1000 04/02/18 1019   04/02/18 1100  metroNIDAZOLE (FLAGYL) IVPB 500 mg  Status:  Discontinued     500 mg 100 mL/hr over 60 Minutes Intravenous Every 8 hours 04/02/18 1000 04/03/18 1131       Assessment/Plan Diabetes mellitus ([place]) HTN (hypertension), benign Sickle cell disease without crisis ([place]) Morbid obesity with body mass index (BMI) of 50.0 to 59.9 in adult   Acute cholecystitis, POD 3, s/p lap chole (staple across infundibulum due to extreme inflammation) -LFTs trending down -patient needs to mobilize in hallways TID -cont  IS use -she's on oxy 30 q 6 hrs, stop robaxin as patient states this doesn't work for her.  Cont her home dose of valium, but will make it scheduled.  On gaba, but states she has tried this and it doesn't work for her.  Will add toradol today and see how that works and check Cr in am.  Her Cr is currently normal. -cont to follow  FEN - heart healthy diet VTE - SCDs/Lovenox (70mg ) ID - none currently   LOS: 5 days    Henreitta Cea , [facility] 04/06/2018, 9:55 AM Pager: [phone]

## 2018-04-06 NOTE — Progress Notes (Addendum)
NCM notified [facility] liaison with [facility] of patient discharge orders for today.  Faxed discharge summary, face-to-face, face sheet to [facility].; received successful fax confirmation.

## 2018-04-06 NOTE — Progress Notes (Signed)
Inpatient Diabetes Program Recommendations  AACE/ADA: New Consensus Statement on Inpatient Glycemic Control (2015)  Target Ranges:  Prepandial:   less than 140 mg/dL      Peak postprandial:   less than 180 mg/dL (1-2 hours)      Critically ill patients:  140 - 180 mg/dL   Lab Results  Component Value Date   GLUCAP 239 (H) 04/06/2018   HGBA1C 9.3 (H) 03/27/2014    Review of Glycemic ControlResults for RAVIN, BENDALL (MRN [id number]) as of 04/06/2018 11:15  Ref. Range 04/05/2018 07:35 04/05/2018 12:18 04/05/2018 17:38 04/05/2018 20:59 04/06/2018 07:58  Glucose-Capillary Latest Ref Range: 65 - 99 mg/dL 160 (H) 133 (H) 166 (H) 143 (H) 239 (H)    Diabetes history: DM Outpatient Diabetes medications: 70/30 35 units AM and 36 units with supper, Januvia 100 mg daily Current orders for Inpatient glycemic control:  70/30 mix 20 units bid Inpatient Diabetes Program Recommendations:    May consider increasing 70/30 to 25 units bid.  Also may consider adding Novolog sensitive correction tid with meals.    Thanks,  Adah Perl, RN, BC-ADM Inpatient Diabetes Coordinator Pager [phone] (8a-5p)

## 2018-04-18 ENCOUNTER — Encounter (HOSPITAL_COMMUNITY): Payer: Self-pay | Admitting: Emergency Medicine

## 2018-04-18 ENCOUNTER — Emergency Department (HOSPITAL_COMMUNITY)
Admission: EM | Admit: 2018-04-18 | Discharge: 2018-04-19 | Disposition: A | Discharge status: Home / Self Care | Payer: Medicare Other | Attending: Emergency Medicine | Admitting: Emergency Medicine

## 2018-04-18 DIAGNOSIS — E119 Type 2 diabetes mellitus without complications: Secondary | ICD-10-CM | POA: Insufficient documentation

## 2018-04-18 DIAGNOSIS — J45909 Unspecified asthma, uncomplicated: Secondary | ICD-10-CM | POA: Insufficient documentation

## 2018-04-18 DIAGNOSIS — R1084 Generalized abdominal pain: Secondary | ICD-10-CM

## 2018-04-18 DIAGNOSIS — Z87891 Personal history of nicotine dependence: Secondary | ICD-10-CM | POA: Insufficient documentation

## 2018-04-18 DIAGNOSIS — Z87442 Personal history of urinary calculi: Secondary | ICD-10-CM | POA: Insufficient documentation

## 2018-04-18 DIAGNOSIS — R112 Nausea with vomiting, unspecified: Secondary | ICD-10-CM | POA: Insufficient documentation

## 2018-04-18 DIAGNOSIS — Z794 Long term (current) use of insulin: Secondary | ICD-10-CM | POA: Insufficient documentation

## 2018-04-18 DIAGNOSIS — I10 Essential (primary) hypertension: Secondary | ICD-10-CM | POA: Diagnosis not present

## 2018-04-18 DIAGNOSIS — Z9889 Other specified postprocedural states: Secondary | ICD-10-CM | POA: Diagnosis not present

## 2018-04-18 LAB — COMPREHENSIVE METABOLIC PANEL
ALT: 40 U/L (ref 14–54)
AST: 105 U/L — ABNORMAL HIGH (ref 15–41)
Albumin: 2.9 g/dL — ABNORMAL LOW (ref 3.5–5.0)
Alkaline Phosphatase: 169 U/L — ABNORMAL HIGH (ref 38–126)
Anion gap: 10 (ref 5–15)
BUN: 7 mg/dL (ref 6–20)
CO2: 26 mmol/L (ref 22–32)
Calcium: 8.5 mg/dL — ABNORMAL LOW (ref 8.9–10.3)
Chloride: 102 mmol/L (ref 101–111)
Creatinine, Ser: 0.78 mg/dL (ref 0.44–1.00)
GFR calc Af Amer: >60 mL/min (ref >60)
GFR calc non Af Amer: >60 mL/min (ref >60)
Glucose, Bld: 169 mg/dL — ABNORMAL HIGH (ref 65–99)
Potassium: 3.6 mmol/L (ref 3.5–5.1)
Sodium: 138 mmol/L (ref 135–145)
Total Bilirubin: 0.7 mg/dL (ref 0.3–1.2)
Total Protein: 6.9 g/dL (ref 6.5–8.1)

## 2018-04-18 LAB — I-STAT TROPONIN, ED: Troponin i, poc: 0.01 ng/mL (ref 0.00–0.08)

## 2018-04-18 LAB — CBC
HCT: 28.1 % — ABNORMAL LOW (ref 36.0–46.0)
Hemoglobin: 8.8 g/dL — ABNORMAL LOW (ref 12.0–15.0)
MCH: 21.3 pg — ABNORMAL LOW (ref 26.0–34.0)
MCHC: 31.3 g/dL (ref 30.0–36.0)
MCV: 67.9 fL — ABNORMAL LOW (ref 78.0–100.0)
Platelets: 520 10*3/uL — ABNORMAL HIGH (ref 150–400)
RBC: 4.14 MIL/uL (ref 3.87–5.11)
RDW: 18.7 % — ABNORMAL HIGH (ref 11.5–15.5)
WBC: 10.9 10*3/uL — ABNORMAL HIGH (ref 4.0–10.5)

## 2018-04-18 LAB — LIPASE, BLOOD: Lipase: 28 U/L (ref 11–51)

## 2018-04-18 LAB — I-STAT CG4 LACTIC ACID, ED: Lactic Acid, Venous: 0.84 mmol/L (ref 0.5–1.9)

## 2018-04-18 LAB — I-STAT BETA HCG BLOOD, ED (MC, WL, AP ONLY): I-stat hCG, quantitative: <5 m[IU]/mL (ref <5)

## 2018-04-18 MED ORDER — HYDROMORPHONE HCL 2 MG/ML IJ SOLN
1.0000 mg | Freq: Once | INTRAMUSCULAR | Status: AC
  Administered 2018-04-18: 1 mg via INTRAVENOUS
  Filled 2018-04-18: qty 1

## 2018-04-18 MED ORDER — IOPAMIDOL (ISOVUE-300) INJECTION 61%
INTRAVENOUS | Status: AC
  Filled 2018-04-18: qty 30

## 2018-04-18 MED ORDER — SODIUM CHLORIDE 0.9 % IV BOLUS
500.0000 mL | Freq: Once | INTRAVENOUS | Status: AC
  Administered 2018-04-19: 500 mL via INTRAVENOUS

## 2018-04-18 NOTE — ED Provider Notes (Signed)
[facility] EMERGENCY DEPARTMENT Provider Note   CSN: [id number] Arrival date & time: 04/18/18  2129     History   Chief Complaint Chief Complaint  Patient presents with  . Abdominal Pain    HPI Cynthia Rosales is a 37 y.o. female with a past medical history of  DM 2, kidney stones, hypertension, who presents today for evaluation of generalized abdominal pain.  She is 15 days post lap chole and she had a drain placed.  She reports that she followed up in the office and that drain was removed.  She reports that her pain has worsened over the past 2 days.  She reports nausea with one episode of vomiting today, says that she has been unable to keep down her at home pain medicines.  She reports feeling like she still is having gallstones.  She reports that she was doing better after the surgery for a while.  She reports that her chest hurts when she breathes, has occasional cough.  Denies shortness of breath, dysuria, hematuria, increased urgency or frequency.  She reports that her last bowel movement was 3 days ago which is normal for her.  HPI  Past Medical History:  Diagnosis Date  . Abscess of bursa, left elbow 06/2013   Treated with I and D/antibiotics.   . Anemia   . Asthma   . Depression   . History of blood transfusion    "after I had one of my kids"  . Hypertension   . Ovarian cyst   . Renal disorder    kidney stones  . Sickle cell disease ([place])   . Type II diabetes mellitus [facility])     Patient Active Problem List   Diagnosis Date Noted  . Morbid obesity with body mass index (BMI) of 50.0 to 59.9 in adult ([place]) 04/02/2018  . Mass of uterus 04/02/2018  . Transaminitis 04/02/2018  . Acute cholecystitis s/p lap cholecystectomy 04/03/2018 04/01/2018  . Pregnancy, ectopic, cornual or cervical 07/21/2017  . 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  . Neurogenic bladder 08/23/2015  . Bilateral carpal tunnel syndrome 08/23/2015  . Paraparesis of both lower limbs ([place])  07/14/2015  . Ileus, postoperative ([place]) 12/25/2014  . Sickle cell disease without crisis ([place]) 12/25/2014  . Diabetes mellitus type 2 in obese ([place]) 12/25/2014  . 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[place]) 12/24/2014  . 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 Ileus of unspecified type ([place])   . Atelectasis   . Bowel obstruction ([place])   . Encounter for central line care   . Ileus ([place])   . Intestinal occlusion ([facility])   . 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  . Headache 11/19/2014  . Neck pain 11/19/2014  . Right sided weakness 11/19/2014  . Paresthesias 11/19/2014  . Nightmares 09/11/2014  . HTN (hypertension), benign 03/28/2014  . Nephrolithiasis 03/27/2014  . Diabetes mellitus ([place]) 03/27/2014  . Abdominal pain 03/27/2014    Past Surgical History:  Procedure Laterality Date  . APPENDECTOMY  2013  . CARPAL TUNNEL RELEASE    . CESAREAN SECTION  2000; 2007; 2011  . CHOLECYSTECTOMY N/A 04/03/2018   Procedure: LAPAROSCOPIC CHOLECYSTECTOMY;  Surgeon: Rolm Bookbinder, MD;  Location: [place];  Service: General;  Laterality: N/A;  . DILATION AND CURETTAGE OF UTERUS    . LAMINECTOMY N/A 12/13/2014   Procedure: CERVICAL LAMINECTOMY FOR INTRADURAL TUMOR;  Surgeon: Charlie Pitter, MD;  Location: [facility];  Service: Neurosurgery;  Laterality: N/A;  posterior  .  LAPAROSCOPY N/A 07/20/2017   Procedure: LAPAROSCOPY OPERATIVE WITH WEDGE RESECTION RIGHT CORNUA AND PARTIAL SALPINGECTOMY;  Surgeon: [name], MD;  Location: [facility];  Service: Gynecology;  Laterality: N/A;  . REDUCTION MAMMAPLASTY Bilateral 1998     OB History    Gravida  5   Para  3   Term  2   Preterm  1   AB      Living  3     SAB      TAB      Ectopic      Multiple      Live Births  3            Home Medications    Prior to Admission medications   Medication Sig Start Date End Date Taking? Authorizing Provider  albuterol (PROVENTIL HFA;VENTOLIN HFA) 108 (90 Base) MCG/ACT  inhaler Inhale 2 puffs into the lungs every 6 (six) hours as needed for wheezing or shortness of breath.   Yes [provider]  diazepam (VALIUM) 5 MG tablet take 1 tablet BY MOUTH THREE TIMES DAILY AS NEEDED Patient taking differently: take 1 tablet BY MOUTH THREE TIMES DAILY AS NEEDED. Muscle spasms 01/26/18  Yes Melvenia Beam, MD  hydrochlorothiazide (HYDRODIURIL) 12.5 MG tablet Take 1 tablet (12.5 mg total) by mouth daily. 04/06/18  Yes Emokpae, Courage, MD  insulin NPH-regular Human (NOVOLIN 70/30) (70-30) 100 UNIT/ML injection Inject 35-36 Units into the skin See admin instructions. Inject 35 units subcutaneously after breakfast and 36 units after supper   Yes [provider]  oxycodone (ROXICODONE) 30 MG immediate release tablet Take 30 mg by mouth every 6 (six) hours as needed for pain.   Yes [provider]  sitaGLIPtin (JANUVIA) 100 MG tablet Take 100 mg by mouth daily.   Yes [provider]  ondansetron (ZOFRAN ODT) 4 MG disintegrating tablet Take 1 tablet (4 mg total) by mouth every 8 (eight) hours as needed for nausea or vomiting. 04/19/18   Lorin Glass, PA-C  Prenat w/o A Vit-FeFum-FePo-FA (CONCEPT OB) 130-92.4-1 MG CAPS Take 1 capsule by mouth daily. Patient not taking: Reported on 11/15/2017 08/09/17   [name], MD    Family History Family History  Problem Relation Age of Onset  . Diabetes Mother   . Hypertension Mother   . Breast cancer Maternal Aunt   . Ovarian cancer Maternal Grandmother   . Breast cancer Maternal Aunt   . Migraines Neg Hx     Social History Social History   Tobacco Use  . Smoking status: Former Smoker    Years: 0.00  . Smokeless tobacco: Never Used  . Tobacco comment: 03/27/2014 "smoked ~ 1 cigarette/day; quit in ~ 2013"  Substance Use Topics  . Alcohol use: Yes    Alcohol/week: 0.0 oz  . Drug use: No     Allergies   Amoxicillin; Buprenorphine hcl; and Morphine and related   Review of  Systems Review of Systems  Constitutional: Negative for chills and fever.  HENT: Negative for ear pain and sore throat.   Eyes: Negative for pain and visual disturbance.  Respiratory: Negative for cough and shortness of breath.   Cardiovascular: Negative for chest pain and palpitations.  Gastrointestinal: Positive for abdominal pain, nausea and vomiting. Negative for constipation and diarrhea.  Genitourinary: Negative for difficulty urinating, dysuria and hematuria.  Musculoskeletal: Negative for arthralgias and back pain.  Skin: Negative for color change and rash.  Neurological: Negative for seizures and syncope.  All other systems reviewed and are negative.    Physical Exam Updated Vital Signs BP 119/75   Pulse 80   Temp 98 F (36.7 C) (Oral)   Resp 15   Ht '5\' 4"'$  (1.626 m)   Wt (!) 140.6 kg (310 lb)   LMP 06/18/2017 (Exact Date)   SpO2 98%   BMI 53.21 kg/m   Physical Exam  Constitutional: She is oriented to person, place, and time. She appears well-developed and well-nourished. No distress.  HENT:  Head: Normocephalic and atraumatic.  Eyes: Conjunctivae are normal.  Neck: Neck supple.  Cardiovascular: Normal rate and regular rhythm.  No murmur heard. Pulmonary/Chest: Effort normal and breath sounds normal. No respiratory distress.  Abdominal: Soft. Bowel sounds are decreased. There is generalized tenderness. There is rebound and guarding. There is no rigidity and no CVA tenderness.  Musculoskeletal: She exhibits no edema.  Neurological: She is alert and oriented to person, place, and time.  Skin: Skin is warm and dry.  Psychiatric: She has a normal mood and affect.  Nursing note and vitals reviewed.    ED Treatments / Results  Labs (all labs ordered are listed, but only abnormal results are displayed) Labs Reviewed  COMPREHENSIVE METABOLIC PANEL - Abnormal; Notable for the following components:      Result Value   Glucose, Bld 169 (*)    Calcium 8.5 (*)     Albumin 2.9 (*)    AST 105 (*)    Alkaline Phosphatase 169 (*)    All other components within normal limits  CBC - Abnormal; Notable for the following components:   WBC 10.9 (*)    Hemoglobin 8.8 (*)    HCT 28.1 (*)    MCV 67.9 (*)    MCH 21.3 (*)    RDW 18.7 (*)    Platelets 520 (*)    All other components within normal limits  URINALYSIS, ROUTINE W REFLEX MICROSCOPIC - Abnormal; Notable for the following components:   Glucose, UA 50 (*)    All other components within normal limits  LIPASE, BLOOD  I-STAT BETA HCG BLOOD, ED ([facility], [facility], [facility] ONLY)  I-STAT TROPONIN, ED  I-STAT CG4 LACTIC ACID, ED    EKG None  Radiology Ct Abdomen Pelvis W Contrast  Result Date: 04/19/2018 CLINICAL DATA:  Initial evaluation for acute generalized abdominal pain with nausea and vomiting. Recent cholecystectomy. EXAM: CT ABDOMEN AND PELVIS WITH CONTRAST TECHNIQUE: Multidetector CT imaging of the abdomen and pelvis was performed using the standard protocol following bolus administration of intravenous contrast. CONTRAST:  70m ISOVUE-300 IOPAMIDOL (ISOVUE-300) INJECTION 61%, 1070mOMNIPAQUE IOHEXOL 300 MG/ML SOLN COMPARISON:  Prior CT and ultrasound from 04/01/2018. FINDINGS: Lower chest: Visualized lung bases are clear. Hepatobiliary: Liver demonstrates a normal contrast enhanced appearance. Patient is status post recent cholecystectomy. Small fluid collection with few foci of internal gas present within the gallbladder fossa, measuring approximately 4.1 x 2.7 x 2.8 cm. No significant rim enhancement or associated inflammatory changes. Mild scattered intrahepatic biliary dilatation, likely related post cholecystectomy changes. Common bile duct of normal caliber. Pancreas: Pancreas within normal limits. Spleen: Spleen within normal limits. Adrenals/Urinary Tract: Adrenal glands are normal. Kidneys equal in size with symmetric enhancement. No nephrolithiasis, hydronephrosis, or focal enhancing renal mass. No  hydroureter. Bladder within normal limits. Stomach/Bowel: Stomach within normal limits. No evidence for bowel obstruction. Appendix not visualize, consistent with history prior appendectomy. No abnormal wall thickening, mucosal enhancement, or inflammatory fat stranding seen about the bowels. Vascular/Lymphatic: Normal intravascular enhancement  seen throughout the intra-abdominal aorta and its branch vessels. No aneurysm. No adenopathy. Reproductive: Probable fibroid uterus again noted, stable from previous. No other adnexal mass. Other: No free intraperitoneal air. No free fluid. Surgical clips noted within the umbilical region related to cholecystectomy. Soft tissue stranding within the subcutaneous fat of the mid and right ventral abdomen consistent with changes related to scope placement. No loculated collections or other complication from recent procedure. Musculoskeletal: No acute osseus abnormality. No worrisome lytic or blastic osseous lesions. IMPRESSION: 1. Postoperative changes from recent cholecystectomy. 4.1 x 2.7 x 2.8 cm collection within the gallbladder fossa of felt to be most consistent with a benign postoperative seroma. No significant inflammatory changes to suggest abscess or other complication. 2. No other acute intra-abdominal or pelvic process. 3. Fibroid uterus. Electronically Signed   By: Jeannine Boga M.D.   On: 04/19/2018 02:44    Procedures Procedures (including critical care time)  Medications Ordered in ED Medications  iopamidol (ISOVUE-300) 61 % injection (has no administration in time range)  HYDROmorphone (DILAUDID) injection 1 mg (1 mg Intravenous Given 04/18/18 2317)  sodium chloride 0.9 % bolus 500 mL (0 mLs Intravenous Stopped 04/19/18 0425)  HYDROmorphone (DILAUDID) injection 1 mg (1 mg Intravenous Given 04/19/18 0120)  iohexol (OMNIPAQUE) 300 MG/ML solution 100 mL (100 mLs Intravenous Contrast Given 04/19/18 0216)  iopamidol (ISOVUE-300) 61 % injection 30 mL  (30 mLs Oral Contrast Given 04/19/18 0000)  HYDROmorphone (DILAUDID) injection 2 mg (2 mg Intravenous Given 04/19/18 0332)     Initial Impression / Assessment and Plan / ED Course  I have reviewed the triage vital signs and the nursing notes.  Pertinent labs & imaging results that were available during my care of the patient were reviewed by me and considered in my medical decision making (see chart for details).  Clinical Course as of Apr 20 431  Mon Apr 18, 2018  2248 Spoke with General surgery on call who asked me to get a CT scan.    [EH]  Tue Apr 19, 2018  0119 Patient reevaluated, she is just about to get second dose of Dilaudid.  She is still in pain however appears more comfortable than when I first met her.  She is drinking her second bottle of contrast in preparation for CT scan.   [EH]    Clinical Course User Index [EH] Lorin Glass, PA-C   Presents today for evaluation of abdominal pain.  She is 15 days postop cholecystectomy.  I spoke with general surgery on-call who recommended CT scan for evaluation in addition to baseline labs.  CT scan with contrast was performed showing a fluid collection in the gallbladder fossa consistent with seroma without evidence of surrounding inflammation.  CT scan did not show any other acute processes in the abdomen.  Patient's pain was treated while she was in the emergency room with Dilaudid and was well controlled at time of discharge.  She was given a prescription for Zofran to take at home to help with her nausea.  Recommended calling surgeon's office in the morning for follow-up appointment.   At this time there does not appear to be any evidence of an acute emergency medical condition and the patient appears stable for discharge with appropriate outpatient follow up.Diagnosis was discussed with patient who verbalizes understanding and is agreeable to discharge. Pt case discussed with Dr. Betsey Holiday who agrees with my plan.    Final  Clinical Impressions(s) / ED Diagnoses   Final diagnoses:  Generalized  abdominal pain    ED Discharge Orders        Ordered    ondansetron (ZOFRAN ODT) 4 MG disintegrating tablet  Every 8 hours PRN     04/19/18 0411       Lorin Glass, PA-C 04/19/18 0443    Orpah Greek, MD 04/28/18 [phone]

## 2018-04-18 NOTE — ED Notes (Signed)
Pt states Dilaudid made no difference in pain level

## 2018-04-18 NOTE — ED Triage Notes (Signed)
Pt transported from home by EMS c/o abd pain, chole last week, now having generalized abd pain, emesis x 1 at home.

## 2018-04-19 ENCOUNTER — Emergency Department (HOSPITAL_COMMUNITY): Payer: Medicare Other

## 2018-04-19 DIAGNOSIS — R1084 Generalized abdominal pain: Secondary | ICD-10-CM | POA: Diagnosis not present

## 2018-04-19 LAB — URINALYSIS, ROUTINE W REFLEX MICROSCOPIC
Bilirubin Urine: NEGATIVE
Glucose, UA: 50 mg/dL — AB
Hgb urine dipstick: NEGATIVE
Ketones, ur: NEGATIVE mg/dL
Leukocytes, UA: NEGATIVE
Nitrite: NEGATIVE
Protein, ur: NEGATIVE mg/dL
Specific Gravity, Urine: 1.015 (ref 1.005–1.030)
pH: 7 (ref 5.0–8.0)

## 2018-04-19 MED ORDER — HYDROMORPHONE HCL 2 MG/ML IJ SOLN
1.0000 mg | Freq: Once | INTRAMUSCULAR | Status: AC
  Administered 2018-04-19: 1 mg via INTRAVENOUS
  Filled 2018-04-19: qty 1

## 2018-04-19 MED ORDER — IOPAMIDOL (ISOVUE-300) INJECTION 61%
30.0000 mL | Freq: Once | INTRAVENOUS | Status: AC | PRN
  Administered 2018-04-19: 30 mL via ORAL

## 2018-04-19 MED ORDER — ONDANSETRON 4 MG PO TBDP: 4.0000 mg | ORAL_TABLET | Freq: Three times a day (TID) | ORAL | 0 refills | Status: DC | PRN

## 2018-04-19 MED ORDER — IOHEXOL 300 MG/ML  SOLN
100.0000 mL | Freq: Once | INTRAMUSCULAR | Status: AC | PRN
  Administered 2018-04-19: 100 mL via INTRAVENOUS

## 2018-04-19 MED ORDER — HYDROMORPHONE HCL 2 MG/ML IJ SOLN
2.0000 mg | Freq: Once | INTRAMUSCULAR | Status: AC
  Administered 2018-04-19: 2 mg via INTRAVENOUS
  Filled 2018-04-19: qty 1

## 2018-04-19 NOTE — ED Notes (Signed)
ED Provider at bedside. 

## 2018-04-19 NOTE — Discharge Instructions (Signed)
Please make sure you are staying well hydrated.   Today you received medications that may make you sleepy or impair your ability to make decisions.  For the next 24 hours please do not drive, operate heavy machinery, care for a small child with out another adult present, or perform any activities that may cause harm to you or someone else if you were to fall asleep or be impaired.   You are being prescribed a medication which may make you sleepy. Please follow up of listed precautions for at least 24 hours after taking one dose.  If you develop fevers, nausea and vomiting uncontrolled with the Zofran, uncontrollable pain, or have any concerns please seek additional medical care and evaluation.

## 2018-04-19 NOTE — ED Provider Notes (Signed)
Patient presented to the ER with abdominal pain.  Patient had a laparoscopic cholecystectomy on May 5.  She reports that she was doing well after surgery but in the last day or so started having severe, sharp and stabbing pain, predominantly on the right side of her abdomen  Face to face Exam: HEENT - PERRLA Lungs - CTAB Heart - RRR, no M/R/G Abd -obese, soft, generalized tenderness without peritonitis Neuro - alert, oriented x3  Plan: Obtain CT scan to evaluate for possible bile leak as well as other intra-abdominal pathology causing her pain.   Orpah Greek, MD 04/19/18 [phone]

## 2018-04-26 ENCOUNTER — Emergency Department (HOSPITAL_COMMUNITY)
Admission: EM | Admit: 2018-04-26 | Discharge: 2018-04-26 | Disposition: A | Discharge status: Home / Self Care | Payer: Medicare Other | Source: Home / Self Care

## 2018-04-26 ENCOUNTER — Encounter (HOSPITAL_COMMUNITY): Payer: Self-pay

## 2018-04-26 DIAGNOSIS — Z5321 Procedure and treatment not carried out due to patient leaving prior to being seen by health care provider: Secondary | ICD-10-CM

## 2018-04-26 DIAGNOSIS — R109 Unspecified abdominal pain: Secondary | ICD-10-CM | POA: Insufficient documentation

## 2018-04-26 DIAGNOSIS — K8051 Calculus of bile duct without cholangitis or cholecystitis with obstruction: Secondary | ICD-10-CM | POA: Diagnosis not present

## 2018-04-26 LAB — COMPREHENSIVE METABOLIC PANEL
ALT: 430 U/L — ABNORMAL HIGH (ref 14–54)
AST: 516 U/L — ABNORMAL HIGH (ref 15–41)
Albumin: 3.4 g/dL — ABNORMAL LOW (ref 3.5–5.0)
Alkaline Phosphatase: 336 U/L — ABNORMAL HIGH (ref 38–126)
Anion gap: 13 (ref 5–15)
BUN: 13 mg/dL (ref 6–20)
CO2: 21 mmol/L — ABNORMAL LOW (ref 22–32)
Calcium: 9.2 mg/dL (ref 8.9–10.3)
Chloride: 99 mmol/L — ABNORMAL LOW (ref 101–111)
Creatinine, Ser: 0.98 mg/dL (ref 0.44–1.00)
GFR calc Af Amer: >60 mL/min (ref >60)
GFR calc non Af Amer: >60 mL/min (ref >60)
Glucose, Bld: 249 mg/dL — ABNORMAL HIGH (ref 65–99)
Potassium: 3.7 mmol/L (ref 3.5–5.1)
Sodium: 133 mmol/L — ABNORMAL LOW (ref 135–145)
Total Bilirubin: 1.4 mg/dL — ABNORMAL HIGH (ref 0.3–1.2)
Total Protein: 8.1 g/dL (ref 6.5–8.1)

## 2018-04-26 LAB — CBC
HCT: 33.7 % — ABNORMAL LOW (ref 36.0–46.0)
Hemoglobin: 10.6 g/dL — ABNORMAL LOW (ref 12.0–15.0)
MCH: 20.8 pg — ABNORMAL LOW (ref 26.0–34.0)
MCHC: 31.5 g/dL (ref 30.0–36.0)
MCV: 66.2 fL — ABNORMAL LOW (ref 78.0–100.0)
Platelets: 514 10*3/uL — ABNORMAL HIGH (ref 150–400)
RBC: 5.09 MIL/uL (ref 3.87–5.11)
RDW: 18.6 % — ABNORMAL HIGH (ref 11.5–15.5)
WBC: 10.7 10*3/uL — ABNORMAL HIGH (ref 4.0–10.5)

## 2018-04-26 LAB — I-STAT BETA HCG BLOOD, ED (MC, WL, AP ONLY): I-stat hCG, quantitative: <5 m[IU]/mL (ref <5)

## 2018-04-26 LAB — LIPASE, BLOOD: Lipase: 27 U/L (ref 11–51)

## 2018-04-26 MED ORDER — ONDANSETRON 4 MG PO TBDP
4.0000 mg | ORAL_TABLET | Freq: Once | ORAL | Status: AC
  Administered 2018-04-26: 4 mg via ORAL
  Filled 2018-04-26: qty 1

## 2018-04-26 NOTE — ED Triage Notes (Signed)
Pt presents with R sided abdominal pain since her surgery May 6.  Pt reports nausea, vomiting and diarrhea x 3 days.

## 2018-04-26 NOTE — ED Triage Notes (Signed)
EMS report:  Pt presents with continuing abdominal pain, nausea, vomiting and diarrhea since gall bladder removed 5/5.  BP: 152/101, P: 96, RR: 16, Pox: 98, CBG: 280

## 2018-04-27 ENCOUNTER — Other Ambulatory Visit: Payer: Self-pay | Admitting: Neurology

## 2018-04-27 ENCOUNTER — Inpatient Hospital Stay (HOSPITAL_COMMUNITY)
Admission: EM | Admit: 2018-04-27 | Discharge: 2018-05-04 | DRG: 409 | Disposition: A | Discharge status: Home / Self Care | Payer: Medicare Other | Attending: Internal Medicine | Admitting: Internal Medicine

## 2018-04-27 ENCOUNTER — Other Ambulatory Visit: Payer: Self-pay

## 2018-04-27 ENCOUNTER — Telehealth: Payer: Self-pay | Admitting: Neurology

## 2018-04-27 DIAGNOSIS — Z79891 Long term (current) use of opiate analgesic: Secondary | ICD-10-CM

## 2018-04-27 DIAGNOSIS — R252 Cramp and spasm: Secondary | ICD-10-CM

## 2018-04-27 DIAGNOSIS — Z881 Allergy status to other antibiotic agents status: Secondary | ICD-10-CM

## 2018-04-27 DIAGNOSIS — Z8249 Family history of ischemic heart disease and other diseases of the circulatory system: Secondary | ICD-10-CM

## 2018-04-27 DIAGNOSIS — D571 Sickle-cell disease without crisis: Secondary | ICD-10-CM | POA: Diagnosis present

## 2018-04-27 DIAGNOSIS — G894 Chronic pain syndrome: Secondary | ICD-10-CM | POA: Diagnosis present

## 2018-04-27 DIAGNOSIS — M549 Dorsalgia, unspecified: Secondary | ICD-10-CM | POA: Diagnosis not present

## 2018-04-27 DIAGNOSIS — R1084 Generalized abdominal pain: Secondary | ICD-10-CM

## 2018-04-27 DIAGNOSIS — Z833 Family history of diabetes mellitus: Secondary | ICD-10-CM

## 2018-04-27 DIAGNOSIS — Z79899 Other long term (current) drug therapy: Secondary | ICD-10-CM

## 2018-04-27 DIAGNOSIS — R188 Other ascites: Secondary | ICD-10-CM

## 2018-04-27 DIAGNOSIS — K810 Acute cholecystitis: Secondary | ICD-10-CM | POA: Diagnosis present

## 2018-04-27 DIAGNOSIS — Z6841 Body Mass Index (BMI) 40.0 and over, adult: Secondary | ICD-10-CM

## 2018-04-27 DIAGNOSIS — Z87891 Personal history of nicotine dependence: Secondary | ICD-10-CM

## 2018-04-27 DIAGNOSIS — F329 Major depressive disorder, single episode, unspecified: Secondary | ICD-10-CM | POA: Diagnosis present

## 2018-04-27 DIAGNOSIS — D649 Anemia, unspecified: Secondary | ICD-10-CM | POA: Diagnosis present

## 2018-04-27 DIAGNOSIS — K9189 Other postprocedural complications and disorders of digestive system: Secondary | ICD-10-CM | POA: Diagnosis present

## 2018-04-27 DIAGNOSIS — R51 Headache: Secondary | ICD-10-CM | POA: Diagnosis not present

## 2018-04-27 DIAGNOSIS — K8051 Calculus of bile duct without cholangitis or cholecystitis with obstruction: Principal | ICD-10-CM | POA: Diagnosis present

## 2018-04-27 DIAGNOSIS — Z888 Allergy status to other drugs, medicaments and biological substances status: Secondary | ICD-10-CM

## 2018-04-27 DIAGNOSIS — K8050 Calculus of bile duct without cholangitis or cholecystitis without obstruction: Secondary | ICD-10-CM

## 2018-04-27 DIAGNOSIS — Z87442 Personal history of urinary calculi: Secondary | ICD-10-CM

## 2018-04-27 DIAGNOSIS — Z794 Long term (current) use of insulin: Secondary | ICD-10-CM

## 2018-04-27 DIAGNOSIS — Y838 Other surgical procedures as the cause of abnormal reaction of the patient, or of later complication, without mention of misadventure at the time of the procedure: Secondary | ICD-10-CM | POA: Diagnosis present

## 2018-04-27 DIAGNOSIS — Z9049 Acquired absence of other specified parts of digestive tract: Secondary | ICD-10-CM

## 2018-04-27 DIAGNOSIS — K838 Other specified diseases of biliary tract: Secondary | ICD-10-CM

## 2018-04-27 DIAGNOSIS — Z8041 Family history of malignant neoplasm of ovary: Secondary | ICD-10-CM

## 2018-04-27 DIAGNOSIS — R740 Nonspecific elevation of levels of transaminase and lactic acid dehydrogenase [LDH]: Secondary | ICD-10-CM | POA: Diagnosis present

## 2018-04-27 DIAGNOSIS — Z885 Allergy status to narcotic agent status: Secondary | ICD-10-CM

## 2018-04-27 DIAGNOSIS — R7989 Other specified abnormal findings of blood chemistry: Secondary | ICD-10-CM

## 2018-04-27 DIAGNOSIS — E669 Obesity, unspecified: Secondary | ICD-10-CM | POA: Diagnosis present

## 2018-04-27 DIAGNOSIS — J45909 Unspecified asthma, uncomplicated: Secondary | ICD-10-CM | POA: Diagnosis present

## 2018-04-27 DIAGNOSIS — Z803 Family history of malignant neoplasm of breast: Secondary | ICD-10-CM

## 2018-04-27 DIAGNOSIS — E1169 Type 2 diabetes mellitus with other specified complication: Secondary | ICD-10-CM | POA: Diagnosis present

## 2018-04-27 DIAGNOSIS — R945 Abnormal results of liver function studies: Secondary | ICD-10-CM

## 2018-04-27 DIAGNOSIS — E119 Type 2 diabetes mellitus without complications: Secondary | ICD-10-CM | POA: Diagnosis present

## 2018-04-27 DIAGNOSIS — R109 Unspecified abdominal pain: Secondary | ICD-10-CM

## 2018-04-27 DIAGNOSIS — R7401 Elevation of levels of liver transaminase levels: Secondary | ICD-10-CM | POA: Diagnosis present

## 2018-04-27 DIAGNOSIS — I10 Essential (primary) hypertension: Secondary | ICD-10-CM | POA: Diagnosis present

## 2018-04-27 LAB — I-STAT BETA HCG BLOOD, ED (MC, WL, AP ONLY): I-stat hCG, quantitative: <5 m[IU]/mL (ref <5)

## 2018-04-27 MED ORDER — FENTANYL CITRATE (PF) 100 MCG/2ML IJ SOLN
25.0000 ug | Freq: Once | INTRAMUSCULAR | Status: AC
  Administered 2018-04-27: 25 ug via INTRAVENOUS
  Filled 2018-04-27: qty 2

## 2018-04-27 MED ORDER — HYDROMORPHONE HCL 1 MG/ML IJ SOLN
1.0000 mg | Freq: Once | INTRAMUSCULAR | Status: AC
  Administered 2018-04-28: 1 mg via INTRAMUSCULAR

## 2018-04-27 MED ORDER — LORAZEPAM 2 MG/ML IJ SOLN
1.0000 mg | Freq: Once | INTRAMUSCULAR | Status: AC
  Administered 2018-04-27: 1 mg via INTRAVENOUS
  Filled 2018-04-27: qty 1

## 2018-04-27 MED ORDER — SODIUM CHLORIDE 0.9 % IV SOLN
INTRAVENOUS | Status: DC
  Administered 2018-04-28: 04:00:00 via INTRAVENOUS

## 2018-04-27 MED ORDER — HYDROMORPHONE HCL 1 MG/ML IJ SOLN
1.0000 mg | Freq: Once | INTRAMUSCULAR | Status: AC
  Administered 2018-04-27: 1 mg via INTRAMUSCULAR
  Filled 2018-04-27: qty 1

## 2018-04-27 MED ORDER — DIAZEPAM 5 MG PO TABS: ORAL_TABLET | ORAL | 4 refills | Status: DC

## 2018-04-27 MED ORDER — SODIUM CHLORIDE 0.9 % IV BOLUS
1000.0000 mL | Freq: Once | INTRAVENOUS | Status: AC
  Administered 2018-04-27: 1000 mL via INTRAVENOUS

## 2018-04-27 NOTE — ED Triage Notes (Signed)
Pt had lap chole 04/04/2018. Pt reports N/V/D for 5 days. Pt has received 4mg  Zofran IV with no relief of nausea. Pt reports RUQ pain 10/10 since surgery.

## 2018-04-27 NOTE — Telephone Encounter (Signed)
Pt request refill for diazepam (VALIUM) 5 MG tablet sent to [phone]) This is the only pharmacy now

## 2018-04-27 NOTE — ED Notes (Signed)
Bed: XO32 Expected date:  Expected time:  Means of arrival:  Comments: EMS/paraplegic n/v

## 2018-04-27 NOTE — ED Notes (Signed)
Follow up call complete  Pt will follow up w pcp  1030  04/27/18  s Moorea Boissonneault rn

## 2018-04-27 NOTE — ED Notes (Signed)
Pt refused blood collection at this time. Pt is requesting dilaudid and to speak with MD. Pt states "I didn't refuse the blood, I'm in pain; there's a difference!" This RN informed pt that not allowing staff to collect blood samples for any reason is a refusal.

## 2018-04-27 NOTE — ED Provider Notes (Signed)
[place] DEPT Provider Note   CSN: [id number] Arrival date & time: 04/27/18  1905     History   Chief Complaint Chief Complaint  Patient presents with  . Abdominal Pain    HPI Cynthia Rosales is a 37 y.o. female.  HPI  Patient presents with concern of nausea, vomiting, abdominal pain. Patient the patient is initially found in left lateral decubitus position, vomiting, spitting, offering only brief nods and brief verbal responses, it seems as though she has been sick for about 5 days, with persistent nausea, p.o. intolerance.  On patient cannot elaborate on this, but in speaking with nursing was clear the patient had cholecystectomy earlier this month. The patient seemingly denies fever, endorses diarrhea. History limited secondary to the patient's acuity of condition.  Past Medical History:  Diagnosis Date  . Abscess of bursa, left elbow 06/2013   Treated with I and D/antibiotics.   . Anemia   . Asthma   . Depression   . History of blood transfusion    "after I had one of my kids"  . Hypertension   . Ovarian cyst   . Renal disorder    kidney stones  . Sickle cell disease ([place])   . Type II diabetes mellitus [facility])     Patient Active Problem List   Diagnosis Date Noted  . Morbid obesity with body mass index (BMI) of 50.0 to 59.9 in adult ([place]) 04/02/2018  . Mass of uterus 04/02/2018  . Transaminitis 04/02/2018  . Acute cholecystitis s/p lap cholecystectomy 04/03/2018 04/01/2018  . Pregnancy, ectopic, cornual or cervical 07/21/2017  . 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  . Neurogenic bladder 08/23/2015  . Bilateral carpal tunnel syndrome 08/23/2015  . Paraparesis of both lower limbs ([place]) 07/14/2015  . Ileus, postoperative ([place]) 12/25/2014  . Sickle cell disease without crisis ([place]) 12/25/2014  . Diabetes mellitus type 2 in obese ([place]) 12/25/2014  . 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[place]) 12/24/2014  . C4 spinal cord  injury ([place]) 12/24/2014  . Ileus of unspecified type ([place])   . Atelectasis   . Bowel obstruction ([place])   . Encounter for central line care   . Ileus ([place])   . Intestinal occlusion (H[facility])   . 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  . Headache 11/19/2014  . Neck pain 11/19/2014  . Right sided weakness 11/19/2014  . Paresthesias 11/19/2014  . Nightmares 09/11/2014  . HTN (hypertension), benign 03/28/2014  . Nephrolithiasis 03/27/2014  . Diabetes mellitus ([place]) 03/27/2014  . Abdominal pain 03/27/2014    Past Surgical History:  Procedure Laterality Date  . APPENDECTOMY  2013  . CARPAL TUNNEL RELEASE    . CESAREAN SECTION  2000; 2007; 2011  . CHOLECYSTECTOMY N/A 04/03/2018   Procedure: LAPAROSCOPIC CHOLECYSTECTOMY;  Surgeon: Rolm Bookbinder, MD;  Location: [place];  Service: General;  Laterality: N/A;  . DILATION AND CURETTAGE OF UTERUS    . LAMINECTOMY N/A 12/13/2014   Procedure: CERVICAL LAMINECTOMY FOR INTRADURAL TUMOR;  Surgeon: Charlie Pitter, MD;  Location: [place] [facility];  Service: Neurosurgery;  Laterality: N/A;  posterior  . LAPAROSCOPY N/A 07/20/2017   Procedure: LAPAROSCOPY OPERATIVE WITH WEDGE RESECTION RIGHT CORNUA AND PARTIAL SALPINGECTOMY;  Surgeon: [name], MD;  Location: [place] [facility];  Service: Gynecology;  Laterality: N/A;  . REDUCTION MAMMAPLASTY Bilateral 1998     OB History    Gravida  5   Para  3   Term  2   Preterm  1  AB      Living  3     SAB      TAB      Ectopic      Multiple      Live Births  3            Home Medications    Prior to Admission medications   Medication Sig Start Date End Date Taking? Authorizing Provider  AMITIZA 24 MCG capsule Take 24 mcg by mouth daily with breakfast.  04/27/18  Yes [provider]  hydrochlorothiazide (HYDRODIURIL) 25 MG tablet Take 25 mg by mouth daily.  04/27/18  Yes [provider]  insulin NPH-regular Human (NOVOLIN 70/30) (70-30) 100 UNIT/ML injection Inject 35-36 Units into the skin  See admin instructions. Inject 35 units subcutaneously after breakfast and 36 units after supper   Yes [provider]  metFORMIN (GLUCOPHAGE) 1000 MG tablet Take 1,000 mg by mouth daily with breakfast.  04/27/18  Yes [provider]  ondansetron (ZOFRAN ODT) 4 MG disintegrating tablet Take 1 tablet (4 mg total) by mouth every 8 (eight) hours as needed for nausea or vomiting. 04/19/18  Yes Lorin Glass, PA-C  oxycodone (ROXICODONE) 30 MG immediate release tablet Take 30 mg by mouth every 6 (six) hours as needed for pain.   Yes [provider]  sitaGLIPtin (JANUVIA) 100 MG tablet Take 100 mg by mouth daily.   Yes [provider]  albuterol (PROVENTIL HFA;VENTOLIN HFA) 108 (90 Base) MCG/ACT inhaler Inhale 2 puffs into the lungs every 6 (six) hours as needed for wheezing or shortness of breath.    [provider]  diazepam (VALIUM) 5 MG tablet take 1 tablet BY MOUTH THREE TIMES DAILY AS NEEDED Patient taking differently: Take 5 mg by mouth every 8 (eight) hours as needed for anxiety (pain). take 1 tablet BY MOUTH THREE TIMES DAILY AS NEEDED 04/27/18   Melvenia Beam, MD  hydrochlorothiazide (HYDRODIURIL) 12.5 MG tablet Take 1 tablet (12.5 mg total) by mouth daily. Patient not taking: Reported on 04/27/2018 04/06/18   Roxan Hockey, MD  Prenat w/o A Vit-FeFum-FePo-FA (CONCEPT OB) 130-92.4-1 MG CAPS Take 1 capsule by mouth daily. Patient not taking: Reported on 11/15/2017 08/09/17   [name], MD    Family History Family History  Problem Relation Age of Onset  . Diabetes Mother   . Hypertension Mother   . Breast cancer Maternal Aunt   . Ovarian cancer Maternal Grandmother   . Breast cancer Maternal Aunt   . Migraines Neg Hx     Social History Social History   Tobacco Use  . Smoking status: Former Smoker    Years: 0.00  . Smokeless tobacco: Never Used  . Tobacco comment: 03/27/2014 "smoked ~ 1 cigarette/day; quit in ~ 2013"  Substance  Use Topics  . Alcohol use: Yes    Alcohol/week: 0.0 oz  . Drug use: No     Allergies   Amoxicillin; Buprenorphine hcl; and Morphine and related   Review of Systems Review of Systems  Constitutional:       Per HPI, otherwise negative  HENT:       Per HPI, otherwise negative  Respiratory:       Per HPI, otherwise negative  Cardiovascular:       Per HPI, otherwise negative  Gastrointestinal: Positive for nausea. Negative for vomiting.  Endocrine:       Negative aside from HPI  Genitourinary:       Neg aside from  HPI   Musculoskeletal:       Per HPI, otherwise negative  Skin: Negative.   Neurological: Negative for syncope.     Physical Exam Updated Vital Signs BP (!) 150/83 (BP Location: Right Arm)   Pulse (!) 110   Resp 20   Ht 5\' 4"  (1.626 m)   Wt (!) 140.6 kg (310 lb)   LMP 06/18/2017 (Exact Date)   SpO2 100%   BMI 53.21 kg/m   Physical Exam  Constitutional: She is oriented to person, place, and time. She appears well-developed and well-nourished. No distress.  Morbidly obese female spitting/vomiting, laying in left lateral decubitus position, speaking only brief verbal responses indicating substantial discomfort.  HENT:  Head: Normocephalic and atraumatic.  Eyes: Conjunctivae and EOM are normal.  Cardiovascular: Normal rate and regular rhythm.  Pulmonary/Chest: Effort normal and breath sounds normal. No stridor. No respiratory distress.  Abdominal: She exhibits no distension.  Musculoskeletal: She exhibits no edema.  Neurological: She is alert and oriented to person, place, and time. No cranial nerve deficit.  Skin: Skin is warm and dry.  Psychiatric: She has a normal mood and affect.  Nursing note and vitals reviewed.    ED Treatments / Results  Labs (all labs ordered are listed, but only abnormal results are displayed) Labs Reviewed  COMPREHENSIVE METABOLIC PANEL  LIPASE, BLOOD  CBC WITH DIFFERENTIAL/PLATELET  URINALYSIS, ROUTINE W REFLEX  MICROSCOPIC  I-STAT BETA HCG BLOOD, ED ([facility], [facility], [facility] ONLY)    EKG None  Radiology No results found.  Procedures Procedures (including critical care time)  Medications Ordered in ED Medications  sodium chloride 0.9 % bolus 1,000 mL (0 mLs Intravenous Stopped 04/27/18 2103)    And  0.9 %  sodium chloride infusion (has no administration in time range)  LORazepam (ATIVAN) injection 1 mg (1 mg Intravenous Given 04/27/18 2001)  fentaNYL (SUBLIMAZE) injection 25 mcg (25 mcg Intravenous Given 04/27/18 2046)  HYDROmorphone (DILAUDID) injection 1 mg (1 mg Intramuscular Given 04/27/18 2152)     Initial Impression / Assessment and Plan / ED Course  I have reviewed the triage vital signs and the nursing notes.  Pertinent labs & imaging results that were available during my care of the patient were reviewed by me and considered in my medical decision making (see chart for details).     9:56 PM Patient now speaking clearly, no longer spitting or vomiting. She describes severe pain around her incision site, states that her pain is only controlled with Dilaudid. She is now committed by her son and her brother, who assist with the HPI.  Chart review notable for cholecystectomy earlier this month, and evaluation 8 days ago for abdominal pain, with CT scan performed, results below IMPRESSION: 1. Postoperative changes from recent cholecystectomy. 4.1 x 2.7 x 2.8 cm collection within the gallbladder fossa of felt to be most consistent with a benign postoperative seroma. No significant inflammatory changes to suggest abscess or other complication. 2. No other acute intra-abdominal or pelvic process. 3. Fibroid uterus.     Electronically Signed   By: Jeannine Boga M.D.   On: 04/19/2018 02:44   10:57 PM Patient continues to complain of severe pain, is now more with her knees on the bed, posterior in the air, complaining of persistent pain in spite of fentanyl, Dilaudid. Patient  accompanied by multiple female family members, we discussed patient's refractory pain. With concern for postoperative hematoma versus abscess or other complication, CT scan will be reperformed, though this was done  only 8 days ago.   Final Clinical Impressions(s) / ED Diagnoses  Abdominal pain Nausea and vomiting   Carmin Muskrat, MD 04/28/18 0021

## 2018-04-27 NOTE — Telephone Encounter (Signed)
Called pt. Clarified that she wants to use [facility]. She would like all other pharmacies removed from her list. She verbalized appreciation and this was updated in her chart.   Valium prescription faxed to pharmacy. Received a receipt of confirmation.

## 2018-04-28 ENCOUNTER — Encounter (HOSPITAL_COMMUNITY): Payer: Self-pay

## 2018-04-28 ENCOUNTER — Emergency Department (HOSPITAL_COMMUNITY): Payer: Medicare Other

## 2018-04-28 ENCOUNTER — Inpatient Hospital Stay (HOSPITAL_COMMUNITY): Payer: Medicare Other

## 2018-04-28 DIAGNOSIS — Z803 Family history of malignant neoplasm of breast: Secondary | ICD-10-CM | POA: Diagnosis not present

## 2018-04-28 DIAGNOSIS — Z8041 Family history of malignant neoplasm of ovary: Secondary | ICD-10-CM | POA: Diagnosis not present

## 2018-04-28 DIAGNOSIS — Z9049 Acquired absence of other specified parts of digestive tract: Secondary | ICD-10-CM | POA: Diagnosis not present

## 2018-04-28 DIAGNOSIS — Z87442 Personal history of urinary calculi: Secondary | ICD-10-CM | POA: Diagnosis not present

## 2018-04-28 DIAGNOSIS — R51 Headache: Secondary | ICD-10-CM | POA: Diagnosis not present

## 2018-04-28 DIAGNOSIS — Z87891 Personal history of nicotine dependence: Secondary | ICD-10-CM | POA: Diagnosis not present

## 2018-04-28 DIAGNOSIS — K9189 Other postprocedural complications and disorders of digestive system: Secondary | ICD-10-CM | POA: Diagnosis not present

## 2018-04-28 DIAGNOSIS — E119 Type 2 diabetes mellitus without complications: Secondary | ICD-10-CM | POA: Diagnosis not present

## 2018-04-28 DIAGNOSIS — Z885 Allergy status to narcotic agent status: Secondary | ICD-10-CM | POA: Diagnosis not present

## 2018-04-28 DIAGNOSIS — R1084 Generalized abdominal pain: Secondary | ICD-10-CM | POA: Diagnosis not present

## 2018-04-28 DIAGNOSIS — E669 Obesity, unspecified: Secondary | ICD-10-CM | POA: Diagnosis not present

## 2018-04-28 DIAGNOSIS — R935 Abnormal findings on diagnostic imaging of other abdominal regions, including retroperitoneum: Secondary | ICD-10-CM | POA: Diagnosis not present

## 2018-04-28 DIAGNOSIS — K8050 Calculus of bile duct without cholangitis or cholecystitis without obstruction: Secondary | ICD-10-CM | POA: Diagnosis not present

## 2018-04-28 DIAGNOSIS — R740 Nonspecific elevation of levels of transaminase and lactic acid dehydrogenase [LDH]: Secondary | ICD-10-CM | POA: Diagnosis present

## 2018-04-28 DIAGNOSIS — Y838 Other surgical procedures as the cause of abnormal reaction of the patient, or of later complication, without mention of misadventure at the time of the procedure: Secondary | ICD-10-CM | POA: Diagnosis present

## 2018-04-28 DIAGNOSIS — Z6841 Body Mass Index (BMI) 40.0 and over, adult: Secondary | ICD-10-CM | POA: Diagnosis not present

## 2018-04-28 DIAGNOSIS — D649 Anemia, unspecified: Secondary | ICD-10-CM | POA: Diagnosis present

## 2018-04-28 DIAGNOSIS — F329 Major depressive disorder, single episode, unspecified: Secondary | ICD-10-CM | POA: Diagnosis not present

## 2018-04-28 DIAGNOSIS — R1011 Right upper quadrant pain: Secondary | ICD-10-CM | POA: Diagnosis not present

## 2018-04-28 DIAGNOSIS — J45909 Unspecified asthma, uncomplicated: Secondary | ICD-10-CM | POA: Diagnosis not present

## 2018-04-28 DIAGNOSIS — Z79899 Other long term (current) drug therapy: Secondary | ICD-10-CM | POA: Diagnosis not present

## 2018-04-28 DIAGNOSIS — R945 Abnormal results of liver function studies: Secondary | ICD-10-CM | POA: Diagnosis not present

## 2018-04-28 DIAGNOSIS — K810 Acute cholecystitis: Secondary | ICD-10-CM | POA: Diagnosis not present

## 2018-04-28 DIAGNOSIS — Z79891 Long term (current) use of opiate analgesic: Secondary | ICD-10-CM | POA: Diagnosis not present

## 2018-04-28 DIAGNOSIS — Z8249 Family history of ischemic heart disease and other diseases of the circulatory system: Secondary | ICD-10-CM | POA: Diagnosis not present

## 2018-04-28 DIAGNOSIS — I10 Essential (primary) hypertension: Secondary | ICD-10-CM | POA: Diagnosis not present

## 2018-04-28 DIAGNOSIS — K8051 Calculus of bile duct without cholangitis or cholecystitis with obstruction: Secondary | ICD-10-CM | POA: Diagnosis not present

## 2018-04-28 DIAGNOSIS — D571 Sickle-cell disease without crisis: Secondary | ICD-10-CM | POA: Diagnosis not present

## 2018-04-28 DIAGNOSIS — K839 Disease of biliary tract, unspecified: Secondary | ICD-10-CM | POA: Diagnosis not present

## 2018-04-28 DIAGNOSIS — K838 Other specified diseases of biliary tract: Secondary | ICD-10-CM | POA: Diagnosis not present

## 2018-04-28 DIAGNOSIS — E1169 Type 2 diabetes mellitus with other specified complication: Secondary | ICD-10-CM | POA: Diagnosis not present

## 2018-04-28 DIAGNOSIS — Z794 Long term (current) use of insulin: Secondary | ICD-10-CM | POA: Diagnosis not present

## 2018-04-28 DIAGNOSIS — Z881 Allergy status to other antibiotic agents status: Secondary | ICD-10-CM | POA: Diagnosis not present

## 2018-04-28 DIAGNOSIS — Z833 Family history of diabetes mellitus: Secondary | ICD-10-CM | POA: Diagnosis not present

## 2018-04-28 DIAGNOSIS — Z888 Allergy status to other drugs, medicaments and biological substances status: Secondary | ICD-10-CM | POA: Diagnosis not present

## 2018-04-28 LAB — HEPATIC FUNCTION PANEL
ALT: 620 U/L — ABNORMAL HIGH (ref 14–54)
AST: 921 U/L — ABNORMAL HIGH (ref 15–41)
Albumin: 3.2 g/dL — ABNORMAL LOW (ref 3.5–5.0)
Alkaline Phosphatase: 429 U/L — ABNORMAL HIGH (ref 38–126)
Bilirubin, Direct: 1.2 mg/dL — ABNORMAL HIGH (ref 0.1–0.5)
Indirect Bilirubin: 1 mg/dL — ABNORMAL HIGH (ref 0.3–0.9)
Total Bilirubin: 2.2 mg/dL — ABNORMAL HIGH (ref 0.3–1.2)
Total Protein: 7.7 g/dL (ref 6.5–8.1)

## 2018-04-28 LAB — COMPREHENSIVE METABOLIC PANEL
ALT: 505 U/L — ABNORMAL HIGH (ref 14–54)
AST: 707 U/L — ABNORMAL HIGH (ref 15–41)
Albumin: 3.5 g/dL (ref 3.5–5.0)
Alkaline Phosphatase: 424 U/L — ABNORMAL HIGH (ref 38–126)
Anion gap: 15 (ref 5–15)
BUN: 15 mg/dL (ref 6–20)
CO2: 21 mmol/L — ABNORMAL LOW (ref 22–32)
Calcium: 9 mg/dL (ref 8.9–10.3)
Chloride: 107 mmol/L (ref 101–111)
Creatinine, Ser: 1.06 mg/dL — ABNORMAL HIGH (ref 0.44–1.00)
GFR calc Af Amer: >60 mL/min (ref >60)
GFR calc non Af Amer: >60 mL/min (ref >60)
Glucose, Bld: 310 mg/dL — ABNORMAL HIGH (ref 65–99)
Potassium: 4.3 mmol/L (ref 3.5–5.1)
Sodium: 143 mmol/L (ref 135–145)
Total Bilirubin: 1.9 mg/dL — ABNORMAL HIGH (ref 0.3–1.2)
Total Protein: 8 g/dL (ref 6.5–8.1)

## 2018-04-28 LAB — BASIC METABOLIC PANEL
Anion gap: 11 (ref 5–15)
BUN: 13 mg/dL (ref 6–20)
CO2: 20 mmol/L — ABNORMAL LOW (ref 22–32)
Calcium: 8.3 mg/dL — ABNORMAL LOW (ref 8.9–10.3)
Chloride: 103 mmol/L (ref 101–111)
Creatinine, Ser: 0.87 mg/dL (ref 0.44–1.00)
GFR calc Af Amer: >60 mL/min (ref >60)
GFR calc non Af Amer: >60 mL/min (ref >60)
Glucose, Bld: 272 mg/dL — ABNORMAL HIGH (ref 65–99)
Potassium: 3.7 mmol/L (ref 3.5–5.1)
Sodium: 134 mmol/L — ABNORMAL LOW (ref 135–145)

## 2018-04-28 LAB — URINALYSIS, ROUTINE W REFLEX MICROSCOPIC
Bacteria, UA: NONE SEEN
Bilirubin Urine: NEGATIVE
Glucose, UA: >=500 mg/dL — AB
Ketones, ur: 20 mg/dL — AB
Leukocytes, UA: NEGATIVE
Nitrite: NEGATIVE
Protein, ur: 100 mg/dL — AB
Specific Gravity, Urine: 1.024 (ref 1.005–1.030)
pH: 5 (ref 5.0–8.0)

## 2018-04-28 LAB — CBC WITH DIFFERENTIAL/PLATELET
Basophils Absolute: 0 10*3/uL (ref 0.0–0.1)
Basophils Absolute: 0 10*3/uL (ref 0.0–0.1)
Basophils Relative: 0 %
Basophils Relative: 0 %
Eosinophils Absolute: 0 10*3/uL (ref 0.0–0.7)
Eosinophils Absolute: 0 10*3/uL (ref 0.0–0.7)
Eosinophils Relative: 0 %
Eosinophils Relative: 0 %
HCT: 31.4 % — ABNORMAL LOW (ref 36.0–46.0)
HCT: 26.1 % — ABNORMAL LOW (ref 36.0–46.0)
Hemoglobin: 10 g/dL — ABNORMAL LOW (ref 12.0–15.0)
Hemoglobin: 8.4 g/dL — ABNORMAL LOW (ref 12.0–15.0)
Lymphocytes Relative: 12 %
Lymphocytes Relative: 19 %
Lymphs Abs: 1.1 10*3/uL (ref 0.7–4.0)
Lymphs Abs: 2.5 10*3/uL (ref 0.7–4.0)
MCH: 21.4 pg — ABNORMAL LOW (ref 26.0–34.0)
MCH: 21.7 pg — ABNORMAL LOW (ref 26.0–34.0)
MCHC: 31.8 g/dL (ref 30.0–36.0)
MCHC: 32.2 g/dL (ref 30.0–36.0)
MCV: 67.1 fL — ABNORMAL LOW (ref 78.0–100.0)
MCV: 67.4 fL — ABNORMAL LOW (ref 78.0–100.0)
Monocytes Absolute: 0.2 10*3/uL (ref 0.1–1.0)
Monocytes Absolute: 0.9 10*3/uL (ref 0.1–1.0)
Monocytes Relative: 2 %
Monocytes Relative: 7 %
Neutro Abs: 7.9 10*3/uL — ABNORMAL HIGH (ref 1.7–7.7)
Neutro Abs: 9.9 10*3/uL — ABNORMAL HIGH (ref 1.7–7.7)
Neutrophils Relative %: 86 %
Neutrophils Relative %: 74 %
Platelets: 398 10*3/uL (ref 150–400)
Platelets: 458 10*3/uL — ABNORMAL HIGH (ref 150–400)
RBC: 4.68 MIL/uL (ref 3.87–5.11)
RBC: 3.87 MIL/uL (ref 3.87–5.11)
RDW: 18.2 % — ABNORMAL HIGH (ref 11.5–15.5)
RDW: 18.1 % — ABNORMAL HIGH (ref 11.5–15.5)
WBC: 9.2 10*3/uL (ref 4.0–10.5)
WBC: 13.3 10*3/uL — ABNORMAL HIGH (ref 4.0–10.5)

## 2018-04-28 LAB — CBG MONITORING, ED
Glucose-Capillary: 262 mg/dL — ABNORMAL HIGH (ref 65–99)
Glucose-Capillary: 233 mg/dL — ABNORMAL HIGH (ref 65–99)
Glucose-Capillary: 215 mg/dL — ABNORMAL HIGH (ref 65–99)

## 2018-04-28 LAB — PROTIME-INR
INR: 1.02
Prothrombin Time: 13.4 seconds (ref 11.4–15.2)

## 2018-04-28 LAB — GLUCOSE, CAPILLARY
Glucose-Capillary: 232 mg/dL — ABNORMAL HIGH (ref 65–99)
Glucose-Capillary: 185 mg/dL — ABNORMAL HIGH (ref 65–99)

## 2018-04-28 LAB — LIPASE, BLOOD: Lipase: 29 U/L (ref 11–51)

## 2018-04-28 MED ORDER — HYDROMORPHONE HCL 1 MG/ML IJ SOLN
2.0000 mg | INTRAMUSCULAR | Status: DC | PRN
  Administered 2018-04-30 – 2018-05-01 (×3): 2 mg via INTRAVENOUS
  Filled 2018-04-28 (×3): qty 2

## 2018-04-28 MED ORDER — SODIUM CHLORIDE 0.9 % IV SOLN
2.0000 g | Freq: Once | INTRAVENOUS | Status: AC
  Administered 2018-04-28: 2 g via INTRAVENOUS
  Filled 2018-04-28: qty 2

## 2018-04-28 MED ORDER — NALOXONE HCL 0.4 MG/ML IJ SOLN: 0.4000 mg | INTRAMUSCULAR | Status: DC | PRN

## 2018-04-28 MED ORDER — INSULIN ASPART 100 UNIT/ML ~~LOC~~ SOLN
0.0000 [IU] | SUBCUTANEOUS | Status: DC
  Administered 2018-04-28: 5 [IU] via SUBCUTANEOUS
  Administered 2018-04-28 (×2): 3 [IU] via SUBCUTANEOUS
  Administered 2018-04-28: 2 [IU] via SUBCUTANEOUS
  Administered 2018-04-28: 3 [IU] via SUBCUTANEOUS
  Administered 2018-04-29 (×2): 1 [IU] via SUBCUTANEOUS
  Filled 2018-04-28 (×3): qty 1

## 2018-04-28 MED ORDER — SODIUM CHLORIDE 0.9% FLUSH: 9.0000 mL | INTRAVENOUS | Status: DC | PRN

## 2018-04-28 MED ORDER — HYDROMORPHONE HCL 2 MG/ML IJ SOLN
2.0000 mg | Freq: Once | INTRAMUSCULAR | Status: DC
  Filled 2018-04-28: qty 1

## 2018-04-28 MED ORDER — ALBUTEROL SULFATE (2.5 MG/3ML) 0.083% IN NEBU: 2.5000 mg | INHALATION_SOLUTION | Freq: Four times a day (QID) | RESPIRATORY_TRACT | Status: DC | PRN

## 2018-04-28 MED ORDER — HYDROMORPHONE HCL 2 MG/ML IJ SOLN
2.0000 mg | INTRAMUSCULAR | Status: DC | PRN
  Administered 2018-04-28 (×3): 2 mg via INTRAVENOUS
  Filled 2018-04-28 (×3): qty 1

## 2018-04-28 MED ORDER — FLUDEOXYGLUCOSE F - 18 (FDG) INJECTION: 5.5000 | Freq: Once | INTRAVENOUS | Status: DC | PRN

## 2018-04-28 MED ORDER — INSULIN GLARGINE 100 UNIT/ML ~~LOC~~ SOLN
10.0000 [IU] | Freq: Every day | SUBCUTANEOUS | Status: DC
  Administered 2018-04-28 – 2018-05-04 (×6): 10 [IU] via SUBCUTANEOUS
  Filled 2018-04-28 (×7): qty 0.1

## 2018-04-28 MED ORDER — ALBUTEROL SULFATE HFA 108 (90 BASE) MCG/ACT IN AERS: 2.0000 | INHALATION_SPRAY | Freq: Four times a day (QID) | RESPIRATORY_TRACT | Status: DC | PRN

## 2018-04-28 MED ORDER — HYDRALAZINE HCL 20 MG/ML IJ SOLN: 10.0000 mg | INTRAMUSCULAR | Status: DC | PRN

## 2018-04-28 MED ORDER — ONDANSETRON HCL 4 MG PO TABS: 4.0000 mg | ORAL_TABLET | Freq: Four times a day (QID) | ORAL | Status: DC | PRN

## 2018-04-28 MED ORDER — ONDANSETRON HCL 4 MG/2ML IJ SOLN: 4.0000 mg | Freq: Four times a day (QID) | INTRAMUSCULAR | Status: DC | PRN

## 2018-04-28 MED ORDER — DIPHENHYDRAMINE HCL 50 MG/ML IJ SOLN
25.0000 mg | Freq: Four times a day (QID) | INTRAMUSCULAR | Status: DC | PRN
  Administered 2018-04-28: 25 mg via INTRAVENOUS
  Filled 2018-04-28: qty 1

## 2018-04-28 MED ORDER — HYDROMORPHONE 1 MG/ML IV SOLN
INTRAVENOUS | Status: DC
  Administered 2018-04-28: 17:00:00 via INTRAVENOUS
  Administered 2018-04-28: 3.1 mg via INTRAVENOUS
  Administered 2018-04-29: 1.5 mg via INTRAVENOUS
  Administered 2018-04-29: 1.6 mg via INTRAVENOUS
  Administered 2018-04-29: 2.1 mg via INTRAVENOUS
  Administered 2018-04-29: 1.5 mg via INTRAVENOUS
  Administered 2018-04-30: 2.1 mg via INTRAVENOUS
  Administered 2018-04-30: 0.3 mg via INTRAVENOUS
  Administered 2018-04-30: 1.2 mg via INTRAVENOUS
  Administered 2018-04-30: 0.6 mg via INTRAVENOUS
  Administered 2018-05-01: 3.9 mg via INTRAVENOUS
  Administered 2018-05-01: 0.8 mg via INTRAVENOUS
  Administered 2018-05-01: 1.2 mg via INTRAVENOUS
  Filled 2018-04-28 (×2): qty 25

## 2018-04-28 MED ORDER — TECHNETIUM TC 99M MEBROFENIN IV KIT
5.5000 | PACK | Freq: Once | INTRAVENOUS | Status: AC | PRN
  Administered 2018-04-28: 5.5 via INTRAVENOUS

## 2018-04-28 MED ORDER — ONDANSETRON HCL 4 MG/2ML IJ SOLN
4.0000 mg | Freq: Four times a day (QID) | INTRAMUSCULAR | Status: DC | PRN
  Administered 2018-04-28: 4 mg via INTRAVENOUS
  Filled 2018-04-28 (×2): qty 2

## 2018-04-28 MED ORDER — HYDROMORPHONE HCL 1 MG/ML IJ SOLN
1.0000 mg | INTRAMUSCULAR | Status: DC | PRN
  Administered 2018-04-28: 1 mg via INTRAVENOUS
  Filled 2018-04-28 (×2): qty 1

## 2018-04-28 MED ORDER — SODIUM CHLORIDE 0.45 % IV SOLN
INTRAVENOUS | Status: AC
  Administered 2018-04-28 – 2018-04-29 (×2): via INTRAVENOUS

## 2018-04-28 MED ORDER — IOPAMIDOL (ISOVUE-300) INJECTION 61%
100.0000 mL | Freq: Once | INTRAVENOUS | Status: AC | PRN
  Administered 2018-04-28: 100 mL via INTRAVENOUS

## 2018-04-28 MED ORDER — IOPAMIDOL (ISOVUE-300) INJECTION 61%
INTRAVENOUS | Status: AC
  Filled 2018-04-28: qty 100

## 2018-04-28 MED ORDER — SODIUM CHLORIDE 0.9 % IV SOLN
25.0000 mg | Freq: Four times a day (QID) | INTRAVENOUS | Status: DC | PRN
  Filled 2018-04-28: qty 0.5

## 2018-04-28 MED ORDER — DIPHENHYDRAMINE-ZINC ACETATE 2-0.1 % EX CREA
TOPICAL_CREAM | Freq: Three times a day (TID) | CUTANEOUS | Status: DC | PRN
  Filled 2018-04-28: qty 28

## 2018-04-28 MED ORDER — SODIUM CHLORIDE 0.9 % IV SOLN
2.0000 g | INTRAVENOUS | Status: DC
  Administered 2018-04-28 – 2018-05-04 (×8): 2 g via INTRAVENOUS
  Filled 2018-04-28 (×4): qty 2
  Filled 2018-04-28: qty 20
  Filled 2018-04-28: qty 2
  Filled 2018-04-28: qty 20
  Filled 2018-04-28: qty 2

## 2018-04-28 NOTE — ED Notes (Signed)
Patient back in ED room from IR

## 2018-04-28 NOTE — ED Notes (Signed)
Patient transported to IR for VQ scan 

## 2018-04-28 NOTE — H&P (Signed)
History and Physical    Dakota Stangl ZDG:[id number] DOB: [birth year] DOA: 04/27/2018  PCP: [facility]  Patient coming from: Home.  Chief Complaint: Worsening abdominal pain.  HPI: Cynthia Rosales is a 37 y.o. female with anemia, hypertension, diabetes mellitus, obesity presents to the ER with complaint of worsening abdominal pain.  Patient had recent partial cholecystectomy following which patient had a readmission due to worsening pain.  Patient was eventually discharged and patient for the last 4 days had severe worsening abdominal pain mostly in the right upper quadrant with persistent nausea vomiting and diarrhea.  Unable to keep in anything.  Unable to take her medications.  ED Course: In the ER patient's LFTs are found to be markedly elevated with CAT scan of the abdomen pelvis showing mild increasing fluid in the gallbladder fossa.  On-call general surgeon Dr. Marcello Moores has been consulted.  Admitted for further management.  Review of Systems: As per HPI, rest all negative.   Past Medical History:  Diagnosis Date  . Abscess of bursa, left elbow 06/2013   Treated with I and D/antibiotics.   . Anemia   . Asthma   . Depression   . History of blood transfusion    "after I had one of my kids"  . Hypertension   . Ovarian cyst   . Renal disorder    kidney stones  . Sickle cell disease ([place])   . Type II diabetes mellitus ([place])     Past Surgical History:  Procedure Laterality Date  . APPENDECTOMY  2013  . CARPAL TUNNEL RELEASE    . CESAREAN SECTION  2000; 2007; 2011  . CHOLECYSTECTOMY N/A 04/03/2018   Procedure: LAPAROSCOPIC CHOLECYSTECTOMY;  Surgeon: Rolm Bookbinder, MD;  Location: [facility];  Service: General;  Laterality: N/A;  . DILATION AND CURETTAGE OF UTERUS    . LAMINECTOMY N/A 12/13/2014   Procedure: CERVICAL LAMINECTOMY FOR INTRADURAL TUMOR;  Surgeon: Charlie Pitter, MD;  Location: [facility];  Service: Neurosurgery;  Laterality: N/A;  posterior  .  LAPAROSCOPY N/A 07/20/2017   Procedure: LAPAROSCOPY OPERATIVE WITH WEDGE RESECTION RIGHT CORNUA AND PARTIAL SALPINGECTOMY;  Surgeon: [name], MD;  Location: [facility];  Service: Gynecology;  Laterality: N/A;  . REDUCTION MAMMAPLASTY Bilateral 1998     reports that she has quit smoking. She quit after 0.00 years of use. She has never used smokeless tobacco. She reports that she drinks alcohol. She reports that she does not use drugs.  Allergies  Allergen Reactions  . Amoxicillin Hives, Rash and Other (See Comments)    PATIENT HAS HAD A PCN REACTION WITH IMMEDIATE RASH, FACIAL/TONGUE/THROAT SWELLING, SOB, OR LIGHTHEADEDNESS WITH HYPOTENSION:  #  #  YES  #  #  Has patient had a PCN reaction causing severe rash involving mucus membranes or skin necrosis: No Has patient had a PCN reaction that required hospitalization: No  Has patient had a PCN reaction occurring within the last 10 years: #  #  #  YES  #  #  #  If all of the above answers are "NO", then may proceed with Cephalosporin use.    . Buprenorphine Hcl Itching and Nausea Only  . Morphine And Related Itching and Nausea Only    Family History  Problem Relation Age of Onset  . Diabetes Mother   . Hypertension Mother   . Breast cancer Maternal Aunt   . Ovarian cancer Maternal Grandmother   . Breast cancer Maternal Aunt   .  Migraines Neg Hx     Prior to Admission medications   Medication Sig Start Date End Date Taking? Authorizing Provider  AMITIZA 24 MCG capsule Take 24 mcg by mouth daily with breakfast.  04/27/18  Yes [provider]  hydrochlorothiazide (HYDRODIURIL) 25 MG tablet Take 25 mg by mouth daily.  04/27/18  Yes [provider]  insulin NPH-regular Human (NOVOLIN 70/30) (70-30) 100 UNIT/ML injection Inject 35-36 Units into the skin See admin instructions. Inject 35 units subcutaneously after breakfast and 36 units after supper   Yes [provider]  metFORMIN (GLUCOPHAGE) 1000 MG tablet Take  1,000 mg by mouth daily with breakfast.  04/27/18  Yes [provider]  ondansetron (ZOFRAN ODT) 4 MG disintegrating tablet Take 1 tablet (4 mg total) by mouth every 8 (eight) hours as needed for nausea or vomiting. 04/19/18  Yes Lorin Glass, PA-C  oxycodone (ROXICODONE) 30 MG immediate release tablet Take 30 mg by mouth every 6 (six) hours as needed for pain.   Yes [provider]  sitaGLIPtin (JANUVIA) 100 MG tablet Take 100 mg by mouth daily.   Yes [provider]  albuterol (PROVENTIL HFA;VENTOLIN HFA) 108 (90 Base) MCG/ACT inhaler Inhale 2 puffs into the lungs every 6 (six) hours as needed for wheezing or shortness of breath.    [provider]  diazepam (VALIUM) 5 MG tablet take 1 tablet BY MOUTH THREE TIMES DAILY AS NEEDED Patient taking differently: Take 5 mg by mouth every 8 (eight) hours as needed for anxiety (pain). take 1 tablet BY MOUTH THREE TIMES DAILY AS NEEDED 04/27/18   Melvenia Beam, MD  hydrochlorothiazide (HYDRODIURIL) 12.5 MG tablet Take 1 tablet (12.5 mg total) by mouth daily. Patient not taking: Reported on 04/27/2018 04/06/18   Roxan Hockey, MD  Prenat w/o A Vit-FeFum-FePo-FA (CONCEPT OB) 130-92.4-1 MG CAPS Take 1 capsule by mouth daily. Patient not taking: Reported on 11/15/2017 08/09/17   [name], MD    Physical Exam: Vitals:   04/27/18 1930 04/27/18 1932 04/28/18 0236  BP: (!) 150/83  139/74  Pulse: (!) 110  (!) 101  Resp: 20  16  SpO2: 100%  100%  Weight:  (!) 140.6 kg (310 lb)   Height:  5\' 4"  (1.626 m)       Constitutional: Moderately built and nourished. Vitals:   04/27/18 1930 04/27/18 1932 04/28/18 0236  BP: (!) 150/83  139/74  Pulse: (!) 110  (!) 101  Resp: 20  16  SpO2: 100%  100%  Weight:  (!) 140.6 kg (310 lb)   Height:  5\' 4"  (1.626 m)    Eyes: Anicteric no pallor. ENMT: No discharge from the ears eyes nose or mouth. Neck: No mass felt.  No neck rigidity but no JVD  appreciated. Respiratory: No rhonchi or crepitations. Cardiovascular: S1-S2 heard no murmurs appreciated. Abdomen: Soft tenderness in the right upper quadrant no guarding or rigidity. Musculoskeletal: No edema. Skin: No rash. Neurologic: Alert awake oriented to time place and person.  Paraplegia. Psychiatric: Appears normal.  Normal affect.   Labs on Admission: I have personally reviewed following labs and imaging studies  CBC: Recent Labs  Lab 04/26/18 1800 04/27/18 2320  WBC 10.7* 9.2  NEUTROABS  --  7.9*  HGB 10.6* 10.0*  HCT 33.7* 31.4*  MCV 66.2* 67.1*  PLT 514* 540   Basic Metabolic Panel: Recent Labs  Lab 04/26/18 1800 04/27/18 2320  NA 133* 143  K 3.7 4.3  CL 99* 107  CO2 21* 21*  GLUCOSE 249* 310*  BUN 13 15  CREATININE 0.98 1.06*  CALCIUM 9.2 9.0   GFR: Estimated Creatinine Clearance: 100.2 mL/min (A) (by C-G formula based on SCr of 1.06 mg/dL (H)). Liver Function Tests: Recent Labs  Lab 04/26/18 1800 04/27/18 2320  AST 516* 707*  ALT 430* 505*  ALKPHOS 336* 424*  BILITOT 1.4* 1.9*  PROT 8.1 8.0  ALBUMIN 3.4* 3.5   Recent Labs  Lab 04/26/18 1800 04/27/18 2320  LIPASE 27 29   No results for input(s): AMMONIA in the last 168 hours. Coagulation Profile: No results for input(s): INR, PROTIME in the last 168 hours. Cardiac Enzymes: No results for input(s): CKTOTAL, CKMB, CKMBINDEX, TROPONINI in the last 168 hours. BNP (last 3 results) No results for input(s): PROBNP in the last 8760 hours. HbA1C: No results for input(s): HGBA1C in the last 72 hours. CBG: No results for input(s): GLUCAP in the last 168 hours. Lipid Profile: No results for input(s): CHOL, HDL, LDLCALC, TRIG, CHOLHDL, LDLDIRECT in the last 72 hours. Thyroid Function Tests: No results for input(s): TSH, T4TOTAL, FREET4, T3FREE, THYROIDAB in the last 72 hours. Anemia Panel: No results for input(s): VITAMINB12, FOLATE, FERRITIN, TIBC, IRON, RETICCTPCT in the last 72  hours. Urine analysis:    Component Value Date/Time   COLORURINE AMBER (A) 04/28/2018 0055   APPEARANCEUR CLEAR 04/28/2018 0055   LABSPEC 1.024 04/28/2018 0055   PHURINE 5.0 04/28/2018 0055   GLUCOSEU >=500 (A) 04/28/2018 0055   HGBUR SMALL (A) 04/28/2018 0055   BILIRUBINUR NEGATIVE 04/28/2018 0055   KETONESUR 20 (A) 04/28/2018 0055   PROTEINUR 100 (A) 04/28/2018 0055   UROBILINOGEN 0.2 05/02/2015 0036   NITRITE NEGATIVE 04/28/2018 0055   LEUKOCYTESUR NEGATIVE 04/28/2018 0055   Sepsis Labs: @LABRCNTIP (procalcitonin:4,lacticidven:4) )No results found for this or any previous visit (from the past 240 hour(s)).   Radiological Exams on Admission: Ct Abdomen Pelvis W Contrast  Result Date: 04/28/2018 CLINICAL DATA:  Acute onset of right upper quadrant abdominal pain, nausea, vomiting and diarrhea. Fever. Recent cholecystectomy. EXAM: CT ABDOMEN AND PELVIS WITH CONTRAST TECHNIQUE: Multidetector CT imaging of the abdomen and pelvis was performed using the standard protocol following bolus administration of intravenous contrast. CONTRAST:  140mL ISOVUE-300 IOPAMIDOL (ISOVUE-300) INJECTION 61% COMPARISON:  CT of the abdomen and pelvis from 04/19/2018 FINDINGS: Lower chest: Minimal right basilar atelectasis is noted. The visualized portions of the mediastinum are unremarkable. Hepatobiliary: Residual fluid and trace air are noted at the gallbladder fossa, more prominent than on the prior study. However, no free fluid is seen tracking within the lower abdomen or pelvis to suggest a large bile leak. There is increased prominence of the intrahepatic biliary ducts, of uncertain significance. Would correlate with LFTs. The liver is otherwise unremarkable in appearance. Pancreas: The pancreas is within normal limits. Spleen: The spleen is unremarkable in appearance. Adrenals/Urinary Tract: The adrenal glands are unremarkable in appearance. The kidneys are within normal limits. There is no evidence of  hydronephrosis. No renal or ureteral stones are identified. No perinephric stranding is seen. Stomach/Bowel: The stomach is unremarkable in appearance. The small bowel is within normal limits. The patient is status post appendectomy. The colon is unremarkable in appearance. Vascular/Lymphatic: The abdominal aorta is unremarkable in appearance. A circumaortic left renal vein is incidentally noted. The inferior vena cava is grossly unremarkable. No retroperitoneal lymphadenopathy is seen. No pelvic sidewall lymphadenopathy is identified. Reproductive: The uterus is mildly distended and unremarkable in appearance. A left-sided uterine fibroid is noted.  No suspicious adnexal masses are seen. Other: No additional soft tissue abnormalities are seen. Musculoskeletal: No acute osseous abnormalities are identified. The visualized musculature is unremarkable in appearance. IMPRESSION: 1. Residual fluid and trace air at the gallbladder fossa, increased from the prior study. However, no free fluid is seen tracking within the lower abdomen or pelvis to suggest a large bile leak. A small bile leak cannot be excluded; if the patient's symptoms persist, HIDA scan could be considered for further evaluation. 2. Increased prominence of the intrahepatic biliary ducts, of uncertain significance. Would correlate with LFTs. 3. Left-sided uterine fibroid noted. Electronically Signed   By: Garald Balding M.D.   On: 04/28/2018 01:41      Assessment/Plan Principal Problem:   Abdominal pain Active Problems:   HTN (hypertension), benign   Diabetes mellitus type 2 in obese [facility])   Acute cholecystitis s/p lap cholecystectomy 04/03/2018   Morbid obesity with body mass index (BMI) of 50.0 to 59.9 in adult (HCC)   Transaminitis   Anemia    1. Abdominal pain with recent cholecystectomy with worsening LFTs -general surgery has been consulted.  Will keep patient n.p.o. pain relief medication antibiotics follow LFTs.  May need GI  consult. 2. Diabetes mellitus type 2 -keep patient on Lantus insulin 10 units with sliding scale coverage.  Patient is n.p.o. 3. Hypertension -since patient is n.p.o. I have kept patient on PRN IV hydralazine. 4. History of sickle cell anemia with worsening pain placed on PCA. 5. Anemia likely from sickle cell disease.  Follow CBC. 6. 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   DVT prophylaxis: SCDs in anticipation of procedure. Code Status: Full code. Family Communication: Family at the bedside. Disposition Plan: Home. Consults called: General surgery. Admission status: Inpatient.   Rise Patience MD [facility] Pager [phone].  If 7PM-7AM, please contact night-coverage [email or web address] Password [facility]  04/28/2018, 5:57 AM

## 2018-04-28 NOTE — Progress Notes (Signed)
Inpatient Diabetes Program Recommendations  AACE/ADA: New Consensus Statement on Inpatient Glycemic Control (2015)  Target Ranges:  Prepandial:   less than 140 mg/dL      Peak postprandial:   less than 180 mg/dL (1-2 hours)      Critically ill patients:  140 - 180 mg/dL   Lab Results  Component Value Date   GLUCAP 215 (H) 04/28/2018   HGBA1C 9.3 (H) 03/27/2014    Review of Glycemic Control  Diabetes history: DM2 Outpatient Diabetes medications: Novolin 70/30 35 units in am and 36 units in pm, metformin 1000 mg QAM Current orders for Inpatient glycemic control: Lantus 10 units QD, Novolog 0-9 units Q4H.  Last HgbA1C - 03/27/2014 - 9.3%.  Inpatient Diabetes Program Recommendations:     Increase Lantus to 25 units QD (Add 1x dose of 15 units now) Change Novolog to 0-15 units Q4H. HgbA1C - need update.  Follow trends.  Thank you. Lorenda Peck, RD, LDN, CDE Inpatient Diabetes Coordinator [phone]

## 2018-04-28 NOTE — ED Notes (Signed)
ED TO INPATIENT HANDOFF REPORT  Name/Age/Gender Cynthia Rosales 37 y.o. female  Code Status    Code Status Orders  (From admission, onward)        Start     Ordered   04/28/18 0557  Full code  Continuous     04/28/18 0557    Code Status History    Date Active Date Inactive Code Status Order ID Comments User Context   04/01/2018 1807 04/06/2018 2251 Full Code [id number]  Bonnell Public, MD Inpatient   07/21/2017 0113 07/21/2017 1632 Full Code [id number]  [name], MD Inpatient   12/18/2014 1611 12/21/2014 1920 Full Code [id number]  Sandi Mariscal, MD Inpatient   12/13/2014 2010 12/18/2014 1611 Full Code [id number]  Charlie Pitter, MD Inpatient   12/11/2014 0021 12/13/2014 2010 Full Code [id number]  Charlie Pitter, MD ED   09/10/2014 1448 09/11/2014 1846 Full Code [id number]  Camprubi-Soms, Patty Sermons, PA-C ED   03/27/2014 1825 03/29/2014 2320 Full Code [id number]  Mendel Corning, MD Inpatient      Home/SNF/Other Home  Chief Complaint Abd pain, Emesis  Level of Care/Admitting Diagnosis ED Disposition    ED Disposition Condition [facility] Area: [facility] [[id number]]  Level of Care: Telemetry [5]  Admit to tele based on following criteria: Monitor QTC interval  Diagnosis: Abdominal pain [650354]  Admitting Physician: Rise Patience [phone]  Attending Physician: Rise Patience [phone]  Estimated length of stay: past midnight tomorrow  Certification:: I certify this patient will need inpatient services for at least 2 midnights  PT Class (Do Not Modify): Inpatient [101]  PT Acc Code (Do Not Modify): Private [1]       Medical History Past Medical History:  Diagnosis Date  . Abscess of bursa, left elbow 06/2013   Treated with I and D/antibiotics.   . Anemia   . Asthma   . Depression   . History of blood transfusion    "after I had one of my kids"  . Hypertension   . Ovarian cyst   . Renal disorder    kidney stones  . Sickle  cell disease ([place])   . Type II diabetes mellitus (HCC)     Allergies Allergies  Allergen Reactions  . Amoxicillin Hives, Rash and Other (See Comments)    PATIENT HAS HAD A PCN REACTION WITH IMMEDIATE RASH, FACIAL/TONGUE/THROAT SWELLING, SOB, OR LIGHTHEADEDNESS WITH HYPOTENSION:  #  #  YES  #  #  Has patient had a PCN reaction causing severe rash involving mucus membranes or skin necrosis: No Has patient had a PCN reaction that required hospitalization: No  Has patient had a PCN reaction occurring within the last 10 years: #  #  #  YES  #  #  #  If all of the above answers are "NO", then may proceed with Cephalosporin use.    . Buprenorphine Hcl Itching and Nausea Only  . Morphine And Related Itching and Nausea Only    IV Location/Drains/Wounds Patient Lines/Drains/Airways Status   Active Line/Drains/Airways    Name:   Placement date:   Placement time:   Site:   Days:   Peripheral IV 04/27/18 Right Hand   04/27/18    -    Hand   1   Incision (Closed) 04/03/18 Abdomen   04/03/18    1010     25   Incision - 5 Ports   04/03/18    -  25   Incision - 6 Ports Abdomen 1: Other (Comment) 2: Upper;Medial 3: Right;Upper 4: Right;Lateral Left;Upper Left;Lower   04/03/18    0840     25          Labs/Imaging Results for orders placed or performed during the hospital encounter of 04/27/18 (from the past 48 hour(s))  Comprehensive metabolic panel     Status: Abnormal   Collection Time: 04/27/18 11:20 PM  Result Value Ref Range   Sodium 143 135 - 145 mmol/L   Potassium 4.3 3.5 - 5.1 mmol/L   Chloride 107 101 - 111 mmol/L   CO2 21 (L) 22 - 32 mmol/L   Glucose, Bld 310 (H) 65 - 99 mg/dL   BUN 15 6 - 20 mg/dL   Creatinine, Ser 1.06 (H) 0.44 - 1.00 mg/dL   Calcium 9.0 8.9 - 10.3 mg/dL   Total Protein 8.0 6.5 - 8.1 g/dL   Albumin 3.5 3.5 - 5.0 g/dL   AST 707 (H) 15 - 41 U/L   ALT 505 (H) 14 - 54 U/L   Alkaline Phosphatase 424 (H) 38 - 126 U/L   Total Bilirubin 1.9 (H) 0.3 - 1.2 mg/dL    GFR calc non Af Amer >60 >60 mL/min   GFR calc Af Amer >60 >60 mL/min    Comment: (NOTE) The eGFR has been calculated using the CKD EPI equation. This calculation has not been validated in all clinical situations. eGFR's persistently <60 mL/min signify possible Chronic Kidney Disease.    Anion gap 15 5 - 15    Comment: Performed at [facility], [place] [address] [place], [place] [address]  Lipase, blood     Status: None   Collection Time: 04/27/18 11:20 PM  Result Value Ref Range   Lipase 29 11 - 51 U/L    Comment: Performed at [facility], [place] [address], [place], [place] [address]  CBC WITH DIFFERENTIAL     Status: Abnormal   Collection Time: 04/27/18 11:20 PM  Result Value Ref Range   WBC 9.2 4.0 - 10.5 K/uL   RBC 4.68 3.87 - 5.11 MIL/uL   Hemoglobin 10.0 (L) 12.0 - 15.0 g/dL   HCT 31.4 (L) 36.0 - 46.0 %   MCV 67.1 (L) 78.0 - 100.0 fL   MCH 21.4 (L) 26.0 - 34.0 pg   MCHC 31.8 30.0 - 36.0 g/dL   RDW 18.2 (H) 11.5 - 15.5 %   Platelets 398 150 - 400 K/uL   Neutrophils Relative % 86 %   Lymphocytes Relative 12 %   Monocytes Relative 2 %   Eosinophils Relative 0 %   Basophils Relative 0 %   Neutro Abs 7.9 (H) 1.7 - 7.7 K/uL   Lymphs Abs 1.1 0.7 - 4.0 K/uL   Monocytes Absolute 0.2 0.1 - 1.0 K/uL   Eosinophils Absolute 0.0 0.0 - 0.7 K/uL   Basophils Absolute 0.0 0.0 - 0.1 K/uL   RBC Morphology TARGET CELLS     Comment: POLYCHROMASIA PRESENT Performed at [facility], [place] [address] [place], [place] [address]   I-Stat beta hCG blood, ED     Status: None   Collection Time: 04/27/18 11:28 PM  Result Value Ref Range   I-stat hCG, quantitative <5.0 <5 mIU/mL   Comment 3            Comment:   GEST. AGE      CONC.  (mIU/mL)   <=1 WEEK  5 - 50     2 WEEKS       50 - 500     3 WEEKS       100 - 10,000     4 WEEKS     1,000 - 30,000        FEMALE AND NON-PREGNANT FEMALE:     LESS THAN 5 mIU/mL   Urinalysis,  Routine w reflex microscopic     Status: Abnormal   Collection Time: 04/28/18 12:55 AM  Result Value Ref Range   Color, Urine AMBER (A) YELLOW    Comment: BIOCHEMICALS MAY BE AFFECTED BY COLOR   APPearance CLEAR CLEAR   Specific Gravity, Urine 1.024 1.005 - 1.030   pH 5.0 5.0 - 8.0   Glucose, UA >=500 (A) NEGATIVE mg/dL   Hgb urine dipstick SMALL (A) NEGATIVE   Bilirubin Urine NEGATIVE NEGATIVE   Ketones, ur 20 (A) NEGATIVE mg/dL   Protein, ur 100 (A) NEGATIVE mg/dL   Nitrite NEGATIVE NEGATIVE   Leukocytes, UA NEGATIVE NEGATIVE   RBC / HPF 0-5 0 - 5 RBC/hpf   WBC, UA 0-5 0 - 5 WBC/hpf   Bacteria, UA NONE SEEN NONE SEEN   Squamous Epithelial / LPF 6-10 0 - 5    Comment: Performed at [facility], [place] [address] [place], [place] [address]  CBG monitoring, ED     Status: Abnormal   Collection Time: 04/28/18  6:32 AM  Result Value Ref Range   Glucose-Capillary 262 (H) 65 - 99 mg/dL  Hepatic function panel     Status: Abnormal   Collection Time: 04/28/18  6:36 AM  Result Value Ref Range   Total Protein 7.7 6.5 - 8.1 g/dL   Albumin 3.2 (L) 3.5 - 5.0 g/dL   AST 921 (H) 15 - 41 U/L   ALT 620 (H) 14 - 54 U/L   Alkaline Phosphatase 429 (H) 38 - 126 U/L   Total Bilirubin 2.2 (H) 0.3 - 1.2 mg/dL   Bilirubin, Direct 1.2 (H) 0.1 - 0.5 mg/dL   Indirect Bilirubin 1.0 (H) 0.3 - 0.9 mg/dL    Comment: Performed at [facility], [place] [address] [place], [place] [address]  CBC WITH DIFFERENTIAL     Status: Abnormal   Collection Time: 04/28/18  6:36 AM  Result Value Ref Range   WBC 13.3 (H) 4.0 - 10.5 K/uL   RBC 3.87 3.87 - 5.11 MIL/uL   Hemoglobin 8.4 (L) 12.0 - 15.0 g/dL   HCT 26.1 (L) 36.0 - 46.0 %   MCV 67.4 (L) 78.0 - 100.0 fL   MCH 21.7 (L) 26.0 - 34.0 pg   MCHC 32.2 30.0 - 36.0 g/dL   RDW 18.1 (H) 11.5 - 15.5 %   Platelets 458 (H) 150 - 400 K/uL   Neutrophils Relative % 74 %   Lymphocytes Relative 19 %   Monocytes Relative 7 %   Eosinophils  Relative 0 %   Basophils Relative 0 %   Neutro Abs 9.9 (H) 1.7 - 7.7 K/uL   Lymphs Abs 2.5 0.7 - 4.0 K/uL   Monocytes Absolute 0.9 0.1 - 1.0 K/uL   Eosinophils Absolute 0.0 0.0 - 0.7 K/uL   Basophils Absolute 0.0 0.0 - 0.1 K/uL   RBC Morphology TARGET CELLS     Comment: POLYCHROMASIA PRESENT Performed at [facility], [place] [address], [place], [place] [address]   Protime-INR     Status: None   Collection Time: 04/28/18  6:36 AM  Result Value Ref Range   Prothrombin Time 13.4 11.4 - 15.2 seconds   INR 1.02     Comment: Performed at [facility], [place] [address], [place], [place] [address]  Basic metabolic panel     Status: Abnormal   Collection Time: 04/28/18  6:36 AM  Result Value Ref Range   Sodium 134 (L) 135 - 145 mmol/L    Comment: DELTA CHECK NOTED REPEATED TO VERIFY    Potassium 3.7 3.5 - 5.1 mmol/L   Chloride 103 101 - 111 mmol/L   CO2 20 (L) 22 - 32 mmol/L   Glucose, Bld 272 (H) 65 - 99 mg/dL   BUN 13 6 - 20 mg/dL   Creatinine, Ser 0.87 0.44 - 1.00 mg/dL   Calcium 8.3 (L) 8.9 - 10.3 mg/dL   GFR calc non Af Amer >60 >60 mL/min   GFR calc Af Amer >60 >60 mL/min    Comment: (NOTE) The eGFR has been calculated using the CKD EPI equation. This calculation has not been validated in all clinical situations. eGFR's persistently <60 mL/min signify possible Chronic Kidney Disease.    Anion gap 11 5 - 15    Comment: Performed at [facility], [place] [address], [place], [place] [address]  CBG monitoring, ED     Status: Abnormal   Collection Time: 04/28/18 10:33 AM  Result Value Ref Range   Glucose-Capillary 233 (H) 65 - 99 mg/dL  CBG monitoring, ED     Status: Abnormal   Collection Time: 04/28/18  2:05 PM  Result Value Ref Range   Glucose-Capillary 215 (H) 65 - 99 mg/dL   Comment 1 Notify RN    Comment 2 Document in Chart    Nm Hepatobiliary Liver Func  Result Date: 04/28/2018 CLINICAL DATA:  37 year old female  with possible biliary leak after cholecystectomy 04/03/2018. Abdominal pain vomiting and fever EXAM: NUCLEAR MEDICINE HEPATOBILIARY IMAGING TECHNIQUE: Sequential images of the abdomen were obtained out to 60 minutes following intravenous administration of radiopharmaceutical. RADIOPHARMACEUTICALS:  5.5 mCi Tc-8m Choletec IV COMPARISON:  None. FINDINGS: Planar imaging was performed after administration of the radiotracer. The blood pool phase is unremarkable. There is uniform uptake of the radiotracer throughout the liver, however, the radiotracer does not excrete into the biliary system after we 2 hours of planar imaging, with persistent uptake throughout liver parenchyma. No GI activity or biliary activity identified after 2 hours. IMPRESSION: Radiotracer uptake is uniform throughout the liver, though is never excreted into the biliary system or GI system, indicating primary liver dysfunction/medical liver disease. Note that the current study cannot rule out a bile leak. Electronically Signed   By: JCorrie MckusickD.O.   On: 04/28/2018 14:50   Ct Abdomen Pelvis W Contrast  Result Date: 04/28/2018 CLINICAL DATA:  Acute onset of right upper quadrant abdominal pain, nausea, vomiting and diarrhea. Fever. Recent cholecystectomy. EXAM: CT ABDOMEN AND PELVIS WITH CONTRAST TECHNIQUE: Multidetector CT imaging of the abdomen and pelvis was performed using the standard protocol following bolus administration of intravenous contrast. CONTRAST:  1056mISOVUE-300 IOPAMIDOL (ISOVUE-300) INJECTION 61% COMPARISON:  CT of the abdomen and pelvis from 04/19/2018 FINDINGS: Lower chest: Minimal right basilar atelectasis is noted. The visualized portions of the mediastinum are unremarkable. Hepatobiliary: Residual fluid and trace air are noted at the gallbladder fossa, more prominent than on the prior study. However, no free fluid is seen tracking within the lower abdomen or pelvis to suggest a large bile leak. There  is increased  prominence of the intrahepatic biliary ducts, of uncertain significance. Would correlate with LFTs. The liver is otherwise unremarkable in appearance. Pancreas: The pancreas is within normal limits. Spleen: The spleen is unremarkable in appearance. Adrenals/Urinary Tract: The adrenal glands are unremarkable in appearance. The kidneys are within normal limits. There is no evidence of hydronephrosis. No renal or ureteral stones are identified. No perinephric stranding is seen. Stomach/Bowel: The stomach is unremarkable in appearance. The small bowel is within normal limits. The patient is status post appendectomy. The colon is unremarkable in appearance. Vascular/Lymphatic: The abdominal aorta is unremarkable in appearance. A circumaortic left renal vein is incidentally noted. The inferior vena cava is grossly unremarkable. No retroperitoneal lymphadenopathy is seen. No pelvic sidewall lymphadenopathy is identified. Reproductive: The uterus is mildly distended and unremarkable in appearance. A left-sided uterine fibroid is noted. No suspicious adnexal masses are seen. Other: No additional soft tissue abnormalities are seen. Musculoskeletal: No acute osseous abnormalities are identified. The visualized musculature is unremarkable in appearance. IMPRESSION: 1. Residual fluid and trace air at the gallbladder fossa, increased from the prior study. However, no free fluid is seen tracking within the lower abdomen or pelvis to suggest a large bile leak. A small bile leak cannot be excluded; if the patient's symptoms persist, HIDA scan could be considered for further evaluation. 2. Increased prominence of the intrahepatic biliary ducts, of uncertain significance. Would correlate with LFTs. 3. Left-sided uterine fibroid noted. Electronically Signed   By: Garald Balding M.D.   On: 04/28/2018 01:41    Pending Labs Unresulted Labs (From admission, onward)   None      Vitals/Pain Today's Vitals   04/28/18 1410 04/28/18  1500 04/28/18 1533 04/28/18 1550  BP: (!) 157/101 130/85    Pulse: 100 95    Resp:  16    SpO2: 100% 100%    Weight:      Height:      PainSc:   10-Worst pain ever 10-Worst pain ever    Isolation Precautions No active isolations  Medications Medications  ondansetron (ZOFRAN) tablet 4 mg (has no administration in time range)    Or  ondansetron (ZOFRAN) injection 4 mg (has no administration in time range)  insulin aspart (novoLOG) injection 0-9 Units (3 Units Subcutaneous Given 04/28/18 1435)  0.45 % sodium chloride infusion ( Intravenous New Bag/Given 04/28/18 0635)  hydrALAZINE (APRESOLINE) injection 10 mg (has no administration in time range)  naloxone [facility]) injection 0.4 mg (has no administration in time range)    And  sodium chloride flush (NS) 0.9 % injection 9 mL (has no administration in time range)  HYDROmorphone (DILAUDID) 1 mg/mL PCA injection ( Intravenous Hold 04/28/18 1411)  insulin glargine (LANTUS) injection 10 Units (10 Units Subcutaneous Given 04/28/18 1454)  HYDROmorphone (DILAUDID) injection 2 mg (2 mg Intravenous Given 04/28/18 1411)  albuterol (PROVENTIL) (2.5 MG/3ML) 0.083% nebulizer solution 2.5 mg (has no administration in time range)  cefTRIAXone (ROCEPHIN) 2 g in sodium chloride 0.9 % 100 mL IVPB (2 g Intravenous New Bag/Given 04/28/18 1435)  diphenhydrAMINE (BENADRYL) injection 25 mg (25 mg Intravenous Given 04/28/18 1556)  sodium chloride 0.9 % bolus 1,000 mL (0 mLs Intravenous Stopped 04/27/18 2103)  LORazepam (ATIVAN) injection 1 mg (1 mg Intravenous Given 04/27/18 2001)  fentaNYL (SUBLIMAZE) injection 25 mcg (25 mcg Intravenous Given 04/27/18 2046)  HYDROmorphone (DILAUDID) injection 1 mg (1 mg Intramuscular Given 04/27/18 2152)  HYDROmorphone (DILAUDID) injection 1 mg (1 mg Intramuscular Given 04/28/18 0102)  iopamidol (ISOVUE-300)  61 % injection 100 mL (100 mLs Intravenous Contrast Given 04/28/18 0119)  aztreonam (AZACTAM) 2 g in sodium chloride 0.9 % 100 mL  IVPB (0 g Intravenous Stopped 04/28/18 1100)  technetium TC 32M mebrofenin (CHOLETEC) injection 5.5 millicurie (5.5 millicuries Intravenous Contrast Given 04/28/18 1100)    Mobility walks with person assist

## 2018-04-28 NOTE — ED Provider Notes (Signed)
Patient was signed out to me by Dr. Vanita Panda to follow-up on CT scan.  Patient with laparoscopic cholecystectomy on May 3.  She had repeat hospitalization several days later for recurrent abdominal pain and elevated LFTs.  Patient had been feeling improved, but in the last few days started having increased pain again.  Once again her LFTs are very elevated, repeat CT scan does show slightly increased fluid collection, etiology still unclear.  Discussed briefly with Dr. Marcello Moores, on-call for general surgery.  Recommends repeat hospitalization by medicine, will see patient in a.m. for further input.   Orpah Greek, MD 04/28/18 [phone]

## 2018-04-28 NOTE — Progress Notes (Signed)
Abdominal pain  Subjective: Patient s/p lap chole on May 3.  She had repeat hospitalization several days later for recurrent abdominal pain and elevated LFTs.  Patient had been improving at home, but in the last few days started having increased pain again.    Objective: Vital signs in last 24 hours: Pulse Rate:  [100-110] 100 (05/30 0603) Resp:  [16-20] 16 (05/30 0603) BP: (139-150)/(74-89) 139/89 (05/30 0603) SpO2:  [95 %-100 %] 95 % (05/30 0603) Weight:  [140.6 kg (310 lb)] 140.6 kg (310 lb) (05/29 1932)    Intake/Output from previous day: 05/29 0701 - 05/30 0700 In: 1281.7 [I.V.:281.3; IV Piggyback:1000.4] Out: -  Intake/Output this shift: Total I/O In: 1281.7 [I.V.:281.3; IV Piggyback:1000.4] Out: -   General appearance: alert and cooperative GI: normal findings: soft, non-tender Incision/Wound: clean, dry, intact  Lab Results:  Results for orders placed or performed during the hospital encounter of 04/27/18 (from the past 24 hour(s))  Comprehensive metabolic panel     Status: Abnormal   Collection Time: 04/27/18 11:20 PM  Result Value Ref Range   Sodium 143 135 - 145 mmol/L   Potassium 4.3 3.5 - 5.1 mmol/L   Chloride 107 101 - 111 mmol/L   CO2 21 (L) 22 - 32 mmol/L   Glucose, Bld 310 (H) 65 - 99 mg/dL   BUN 15 6 - 20 mg/dL   Creatinine, Ser 1.06 (H) 0.44 - 1.00 mg/dL   Calcium 9.0 8.9 - 10.3 mg/dL   Total Protein 8.0 6.5 - 8.1 g/dL   Albumin 3.5 3.5 - 5.0 g/dL   AST 707 (H) 15 - 41 U/L   ALT 505 (H) 14 - 54 U/L   Alkaline Phosphatase 424 (H) 38 - 126 U/L   Total Bilirubin 1.9 (H) 0.3 - 1.2 mg/dL   GFR calc non Af Amer >60 >60 mL/min   GFR calc Af Amer >60 >60 mL/min   Anion gap 15 5 - 15  Lipase, blood     Status: None   Collection Time: 04/27/18 11:20 PM  Result Value Ref Range   Lipase 29 11 - 51 U/L  CBC WITH DIFFERENTIAL     Status: Abnormal   Collection Time: 04/27/18 11:20 PM  Result Value Ref Range   WBC 9.2 4.0 - 10.5 K/uL   RBC 4.68 3.87 - 5.11  MIL/uL   Hemoglobin 10.0 (L) 12.0 - 15.0 g/dL   HCT 31.4 (L) 36.0 - 46.0 %   MCV 67.1 (L) 78.0 - 100.0 fL   MCH 21.4 (L) 26.0 - 34.0 pg   MCHC 31.8 30.0 - 36.0 g/dL   RDW 18.2 (H) 11.5 - 15.5 %   Platelets 398 150 - 400 K/uL   Neutrophils Relative % 86 %   Lymphocytes Relative 12 %   Monocytes Relative 2 %   Eosinophils Relative 0 %   Basophils Relative 0 %   Neutro Abs 7.9 (H) 1.7 - 7.7 K/uL   Lymphs Abs 1.1 0.7 - 4.0 K/uL   Monocytes Absolute 0.2 0.1 - 1.0 K/uL   Eosinophils Absolute 0.0 0.0 - 0.7 K/uL   Basophils Absolute 0.0 0.0 - 0.1 K/uL   RBC Morphology TARGET CELLS   I-Stat beta hCG blood, ED     Status: None   Collection Time: 04/27/18 11:28 PM  Result Value Ref Range   I-stat hCG, quantitative <5.0 <5 mIU/mL   Comment 3          Urinalysis, Routine w reflex microscopic  Status: Abnormal   Collection Time: 04/28/18 12:55 AM  Result Value Ref Range   Color, Urine AMBER (A) YELLOW   APPearance CLEAR CLEAR   Specific Gravity, Urine 1.024 1.005 - 1.030   pH 5.0 5.0 - 8.0   Glucose, UA >=500 (A) NEGATIVE mg/dL   Hgb urine dipstick SMALL (A) NEGATIVE   Bilirubin Urine NEGATIVE NEGATIVE   Ketones, ur 20 (A) NEGATIVE mg/dL   Protein, ur 100 (A) NEGATIVE mg/dL   Nitrite NEGATIVE NEGATIVE   Leukocytes, UA NEGATIVE NEGATIVE   RBC / HPF 0-5 0 - 5 RBC/hpf   WBC, UA 0-5 0 - 5 WBC/hpf   Bacteria, UA NONE SEEN NONE SEEN   Squamous Epithelial / LPF 6-10 0 - 5     Studies/Results Radiology     MEDS, Scheduled . HYDROmorphone   Intravenous Q4H  . insulin aspart  0-9 Units Subcutaneous Q4H  . insulin glargine  10 Units Subcutaneous Daily     Assessment: Abdominal pain CT shows GB fossa fluid collection.  It appears to be a simple fluid collection Cholangiogram was unable to be performed during surgery.  A partial cholecystectomy was performed to avoid injury to the CBD.  Plan: Rec HIDA to eval for bile leak or obstruction.  GI eval would also be warranted due  to persistent elevated transaminates.      LOS: 0 days    Rosario Adie, MD [facility] Surgery, [place]   04/28/2018 6:52 AM

## 2018-04-29 DIAGNOSIS — Z6841 Body Mass Index (BMI) 40.0 and over, adult: Secondary | ICD-10-CM

## 2018-04-29 DIAGNOSIS — I10 Essential (primary) hypertension: Secondary | ICD-10-CM

## 2018-04-29 DIAGNOSIS — E1169 Type 2 diabetes mellitus with other specified complication: Secondary | ICD-10-CM

## 2018-04-29 DIAGNOSIS — R740 Nonspecific elevation of levels of transaminase and lactic acid dehydrogenase [LDH]: Secondary | ICD-10-CM

## 2018-04-29 DIAGNOSIS — E669 Obesity, unspecified: Secondary | ICD-10-CM

## 2018-04-29 DIAGNOSIS — K810 Acute cholecystitis: Secondary | ICD-10-CM

## 2018-04-29 LAB — COMPREHENSIVE METABOLIC PANEL
ALT: 607 U/L — ABNORMAL HIGH (ref 14–54)
AST: 520 U/L — ABNORMAL HIGH (ref 15–41)
Albumin: 3 g/dL — ABNORMAL LOW (ref 3.5–5.0)
Alkaline Phosphatase: 435 U/L — ABNORMAL HIGH (ref 38–126)
Anion gap: 8 (ref 5–15)
BUN: 7 mg/dL (ref 6–20)
CO2: 25 mmol/L (ref 22–32)
Calcium: 8.3 mg/dL — ABNORMAL LOW (ref 8.9–10.3)
Chloride: 103 mmol/L (ref 101–111)
Creatinine, Ser: 0.78 mg/dL (ref 0.44–1.00)
GFR calc Af Amer: >60 mL/min (ref >60)
GFR calc non Af Amer: >60 mL/min (ref >60)
Glucose, Bld: 132 mg/dL — ABNORMAL HIGH (ref 65–99)
Potassium: 3.8 mmol/L (ref 3.5–5.1)
Sodium: 136 mmol/L (ref 135–145)
Total Bilirubin: 3 mg/dL — ABNORMAL HIGH (ref 0.3–1.2)
Total Protein: 7.6 g/dL (ref 6.5–8.1)

## 2018-04-29 LAB — GLUCOSE, CAPILLARY
Glucose-Capillary: 104 mg/dL — ABNORMAL HIGH (ref 65–99)
Glucose-Capillary: 118 mg/dL — ABNORMAL HIGH (ref 65–99)
Glucose-Capillary: 122 mg/dL — ABNORMAL HIGH (ref 65–99)
Glucose-Capillary: 123 mg/dL — ABNORMAL HIGH (ref 65–99)
Glucose-Capillary: 133 mg/dL — ABNORMAL HIGH (ref 65–99)
Glucose-Capillary: 139 mg/dL — ABNORMAL HIGH (ref 65–99)
Glucose-Capillary: 145 mg/dL — ABNORMAL HIGH (ref 65–99)

## 2018-04-29 LAB — CBC
HCT: 30.3 % — ABNORMAL LOW (ref 36.0–46.0)
Hemoglobin: 9.5 g/dL — ABNORMAL LOW (ref 12.0–15.0)
MCH: 21.4 pg — ABNORMAL LOW (ref 26.0–34.0)
MCHC: 31.4 g/dL (ref 30.0–36.0)
MCV: 68.4 fL — ABNORMAL LOW (ref 78.0–100.0)
Platelets: 405 10*3/uL — ABNORMAL HIGH (ref 150–400)
RBC: 4.43 MIL/uL (ref 3.87–5.11)
RDW: 18.3 % — ABNORMAL HIGH (ref 11.5–15.5)
WBC: 9.8 10*3/uL (ref 4.0–10.5)

## 2018-04-29 MED ORDER — SODIUM CHLORIDE 0.9 % IV SOLN
25.0000 mg | Freq: Four times a day (QID) | INTRAVENOUS | Status: DC | PRN
  Filled 2018-04-29: qty 0.5

## 2018-04-29 MED ORDER — DIPHENHYDRAMINE-ZINC ACETATE 2-0.1 % EX CREA
TOPICAL_CREAM | Freq: Three times a day (TID) | CUTANEOUS | Status: DC | PRN
  Administered 2018-05-02: via TOPICAL
  Filled 2018-04-29: qty 28

## 2018-04-29 MED ORDER — SODIUM CHLORIDE 0.45 % IV SOLN: INTRAVENOUS | Status: AC

## 2018-04-29 MED ORDER — IBUPROFEN 200 MG PO TABS
400.0000 mg | ORAL_TABLET | Freq: Four times a day (QID) | ORAL | Status: DC | PRN
  Administered 2018-04-29: 400 mg via ORAL
  Filled 2018-04-29: qty 2

## 2018-04-29 MED ORDER — LIP MEDEX EX OINT
TOPICAL_OINTMENT | CUTANEOUS | Status: AC
  Administered 2018-04-29: 01:00:00
  Filled 2018-04-29: qty 7

## 2018-04-29 MED ORDER — INSULIN ASPART 100 UNIT/ML ~~LOC~~ SOLN
0.0000 [IU] | SUBCUTANEOUS | Status: DC
  Administered 2018-04-29 – 2018-04-30 (×3): 1 [IU] via SUBCUTANEOUS
  Administered 2018-04-30 – 2018-05-01 (×3): 2 [IU] via SUBCUTANEOUS
  Administered 2018-05-01 (×2): 3 [IU] via SUBCUTANEOUS
  Administered 2018-05-01: 2 [IU] via SUBCUTANEOUS
  Administered 2018-05-02: 5 [IU] via SUBCUTANEOUS
  Administered 2018-05-02: 1 [IU] via SUBCUTANEOUS
  Administered 2018-05-02: 2 [IU] via SUBCUTANEOUS
  Administered 2018-05-02 (×2): 3 [IU] via SUBCUTANEOUS
  Administered 2018-05-02: 2 [IU] via SUBCUTANEOUS
  Administered 2018-05-02: 1 [IU] via SUBCUTANEOUS
  Administered 2018-05-03: 2 [IU] via SUBCUTANEOUS
  Administered 2018-05-03 (×2): 5 [IU] via SUBCUTANEOUS
  Administered 2018-05-03: 3 [IU] via SUBCUTANEOUS
  Administered 2018-05-03 – 2018-05-04 (×3): 5 [IU] via SUBCUTANEOUS
  Administered 2018-05-04: 3 [IU] via SUBCUTANEOUS
  Administered 2018-05-04: 2 [IU] via SUBCUTANEOUS

## 2018-04-29 NOTE — Progress Notes (Signed)
[facility] Progress Note     Subjective: CC:  Mild RUQ pain, improved compared to yesterday. Last episode emesis yesterday. Requesting to eat. Discussed results of HIDA with patient.   Objective: Vital signs in last 24 hours: Temp:  [98 F (36.7 C)-98.8 F (37.1 C)] 98.8 F (37.1 C) (05/31 0721) Pulse Rate:  [86-100] 90 (05/31 0725) Resp:  [12-18] 15 (05/31 0724) BP: (72-157)/(54-101) 140/80 (05/31 0725) SpO2:  [98 %-100 %] 100 % (05/31 0724) Last BM Date: 04/27/18  Intake/Output from previous day: 05/30 0701 - 05/31 0700 In: 3027.1 [I.V.:2927.1; IV Piggyback:100] Out: 500 [Urine:500] Intake/Output this shift: No intake/output data recorded.  PE: Gen:  Alert, NAD, pleasant Card:  Regular rate and rhythm, pedal pulses 2+ BL Pulm:  Normal effort, clear to auscultation bilaterally, incisions c/d/i Abd: Soft, obese TTP RUQ, no peritonitis  Skin: warm and dry, no rashes  Psych: A&Ox3   Lab Results:  Recent Labs    04/27/18 2320 04/28/18 0636  WBC 9.2 13.3*  HGB 10.0* 8.4*  HCT 31.4* 26.1*  PLT 398 458*   BMET Recent Labs    04/27/18 2320 04/28/18 0636  NA 143 134*  K 4.3 3.7  CL 107 103  CO2 21* 20*  GLUCOSE 310* 272*  BUN 15 13  CREATININE 1.06* 0.87  CALCIUM 9.0 8.3*   PT/INR Recent Labs    04/28/18 0636  LABPROT 13.4  INR 1.02   CMP     Component Value Date/Time   NA 134 (L) 04/28/2018 0636   NA 140 11/19/2014 0933   K 3.7 04/28/2018 0636   CL 103 04/28/2018 0636   CO2 20 (L) 04/28/2018 0636   GLUCOSE 272 (H) 04/28/2018 0636   BUN 13 04/28/2018 0636   BUN 10 11/19/2014 0933   CREATININE 0.87 04/28/2018 0636   CALCIUM 8.3 (L) 04/28/2018 0636   PROT 7.7 04/28/2018 0636   PROT 6.7 11/19/2014 0933   ALBUMIN 3.2 (L) 04/28/2018 0636   ALBUMIN 3.3 (L) 11/19/2014 0933   AST 921 (H) 04/28/2018 0636   ALT 620 (H) 04/28/2018 0636   ALKPHOS 429 (H) 04/28/2018 0636   BILITOT 2.2 (H) 04/28/2018 0636   GFRNONAA >60 04/28/2018 0636    GFRAA >60 04/28/2018 0636   Lipase     Component Value Date/Time   LIPASE 29 04/27/2018 2320       Studies/Results: Nm Hepatobiliary Liver Func  Result Date: 04/28/2018 CLINICAL DATA:  37 year old female with possible biliary leak after cholecystectomy 04/03/2018. Abdominal pain vomiting and fever EXAM: NUCLEAR MEDICINE HEPATOBILIARY IMAGING TECHNIQUE: Sequential images of the abdomen were obtained out to 60 minutes following intravenous administration of radiopharmaceutical. RADIOPHARMACEUTICALS:  5.5 mCi Tc-66m  Choletec IV COMPARISON:  None. FINDINGS: Planar imaging was performed after administration of the radiotracer. The blood pool phase is unremarkable. There is uniform uptake of the radiotracer throughout the liver, however, the radiotracer does not excrete into the biliary system after we 2 hours of planar imaging, with persistent uptake throughout liver parenchyma. No GI activity or biliary activity identified after 2 hours. IMPRESSION: Radiotracer uptake is uniform throughout the liver, though is never excreted into the biliary system or GI system, indicating primary liver dysfunction/medical liver disease. Note that the current study cannot rule out a bile leak. Electronically Signed   By: Corrie Mckusick D.O.   On: 04/28/2018 14:50   Ct Abdomen Pelvis W Contrast  Result Date: 04/28/2018 CLINICAL DATA:  Acute onset of right upper quadrant abdominal pain, nausea,  vomiting and diarrhea. Fever. Recent cholecystectomy. EXAM: CT ABDOMEN AND PELVIS WITH CONTRAST TECHNIQUE: Multidetector CT imaging of the abdomen and pelvis was performed using the standard protocol following bolus administration of intravenous contrast. CONTRAST:  144mL ISOVUE-300 IOPAMIDOL (ISOVUE-300) INJECTION 61% COMPARISON:  CT of the abdomen and pelvis from 04/19/2018 FINDINGS: Lower chest: Minimal right basilar atelectasis is noted. The visualized portions of the mediastinum are unremarkable. Hepatobiliary: Residual fluid  and trace air are noted at the gallbladder fossa, more prominent than on the prior study. However, no free fluid is seen tracking within the lower abdomen or pelvis to suggest a large bile leak. There is increased prominence of the intrahepatic biliary ducts, of uncertain significance. Would correlate with LFTs. The liver is otherwise unremarkable in appearance. Pancreas: The pancreas is within normal limits. Spleen: The spleen is unremarkable in appearance. Adrenals/Urinary Tract: The adrenal glands are unremarkable in appearance. The kidneys are within normal limits. There is no evidence of hydronephrosis. No renal or ureteral stones are identified. No perinephric stranding is seen. Stomach/Bowel: The stomach is unremarkable in appearance. The small bowel is within normal limits. The patient is status post appendectomy. The colon is unremarkable in appearance. Vascular/Lymphatic: The abdominal aorta is unremarkable in appearance. A circumaortic left renal vein is incidentally noted. The inferior vena cava is grossly unremarkable. No retroperitoneal lymphadenopathy is seen. No pelvic sidewall lymphadenopathy is identified. Reproductive: The uterus is mildly distended and unremarkable in appearance. A left-sided uterine fibroid is noted. No suspicious adnexal masses are seen. Other: No additional soft tissue abnormalities are seen. Musculoskeletal: No acute osseous abnormalities are identified. The visualized musculature is unremarkable in appearance. IMPRESSION: 1. Residual fluid and trace air at the gallbladder fossa, increased from the prior study. However, no free fluid is seen tracking within the lower abdomen or pelvis to suggest a large bile leak. A small bile leak cannot be excluded; if the patient's symptoms persist, HIDA scan could be considered for further evaluation. 2. Increased prominence of the intrahepatic biliary ducts, of uncertain significance. Would correlate with LFTs. 3. Left-sided uterine  fibroid noted. Electronically Signed   By: Garald Balding M.D.   On: 04/28/2018 01:41    Anti-infectives: Anti-infectives (From admission, onward)   Start     Dose/Rate Route Frequency Ordered Stop   04/28/18 1500  cefTRIAXone (ROCEPHIN) 2 g in sodium chloride 0.9 % 100 mL IVPB     2 g 200 mL/hr over 30 Minutes Intravenous Every 24 hours 04/28/18 1055     04/28/18 0930  aztreonam (AZACTAM) 2 g in sodium chloride 0.9 % 100 mL IVPB     2 g 200 mL/hr over 30 Minutes Intravenous  Once 04/28/18 0929 04/28/18 1100     Assessment/Plan Abdominal pain S/p lap chole 5/3 -  Cholangiogram was unable to be performed during surgery.  A partial cholecystectomy was performed to avoid injury to the CBD. -  CT shows GB fossa fluid collection.  It appears to be a simple fluid collection. continue IV abx. Will follow and determine if repeat CT scan to evaluate fluid collection again is necessary. -  HIDA - Radiotracer uptake is uniform throughout the liver but never excreted into biliary system - CBC and LFT's today pending   Plan - recommend GI consult to further evaluate elevated transaminates and above abnormal HIDA results.     LOS: 1 day    [facility] 04/29/2018, 11:32 AM Pager: [phone] Consults: [phone] Mon-Fri 7:00 am-4:30 pm  Sat-Sun 7:00 am-11:30 am

## 2018-04-29 NOTE — Plan of Care (Signed)
  Problem: Pain Managment: Goal: General experience of comfort will improve Outcome: Progressing   

## 2018-04-29 NOTE — Progress Notes (Signed)
Patient ID: Cynthia Rosales, female   DOB: [birth year], 37 y.o.   MRN: [id number] Subjective:  Patient complaining of headache, attributed to hunger (she has not eaten anything for 4 days). She claims her nausea and vomiting has subsided. She still has abdominal pain, especially at the RUQ. She has not had BM but no food for 4 days. She denies any chest pain, no SOB, no fever, no urinary symptom.  Objective:  Vital signs in last 24 hours:  Vitals:   04/29/18 0725 04/29/18 1135 04/29/18 1355 04/29/18 1542  BP: 140/80  127/80   Pulse: 90  87   Resp:  13 16 15   Temp:   98.4 F (36.9 C)   TempSrc:   Oral   SpO2:  99% 100% 100%  Weight:      Height:        Intake/Output from previous day:   Intake/Output Summary (Last 24 hours) at 04/29/2018 1758 Last data filed at 04/29/2018 1600 Gross per 24 hour  Intake 3533.33 ml  Output 500 ml  Net 3033.33 ml    Physical Exam: General: Alert, awake, oriented x3, in no acute distress. Morbidly obese. HEENT: [place]/AT PEERL, EOMI Neck: Trachea midline,  no masses, no thyromegal,y no JVD, no carotid bruit OROPHARYNX:  Moist, No exudate/ erythema/lesions.  Heart: Regular rate and rhythm, without murmurs, rubs, gallops, PMI non-displaced, no heaves or thrills on palpation.  Lungs: Clear to auscultation, no wheezing or rhonchi noted. No increased vocal fremitus resonant to percussion  Abdomen: Soft, nontender, nondistended, positive bowel sounds, no masses no hepatosplenomegaly noted..  Neuro: No focal neurological deficits noted cranial nerves II through XII grossly intact. DTRs 2+ bilaterally upper and lower extremities. Strength 5 out of 5 in bilateral upper and lower extremities. Musculoskeletal: No warm swelling or erythema around joints, no spinal tenderness noted. Psychiatric: Patient alert and oriented x3, good insight and cognition, good recent to remote recall. Lymph node survey: No cervical axillary or inguinal lymphadenopathy noted.  Lab  Results:  Basic Metabolic Panel:    Component Value Date/Time   NA 136 04/29/2018 1113   NA 140 11/19/2014 0933   K 3.8 04/29/2018 1113   CL 103 04/29/2018 1113   CO2 25 04/29/2018 1113   BUN 7 04/29/2018 1113   BUN 10 11/19/2014 0933   CREATININE 0.78 04/29/2018 1113   GLUCOSE 132 (H) 04/29/2018 1113   CALCIUM 8.3 (L) 04/29/2018 1113   CBC:    Component Value Date/Time   WBC 9.8 04/29/2018 1113   HGB 9.5 (L) 04/29/2018 1113   HCT 30.3 (L) 04/29/2018 1113   PLT 405 (H) 04/29/2018 1113   MCV 68.4 (L) 04/29/2018 1113   NEUTROABS 9.9 (H) 04/28/2018 0636   LYMPHSABS 2.5 04/28/2018 0636   MONOABS 0.9 04/28/2018 0636   EOSABS 0.0 04/28/2018 0636   BASOSABS 0.0 04/28/2018 0636    No results found for this or any previous visit (from the past 240 hour(s)).  Studies/Results: Nm Hepatobiliary Liver Func  Result Date: 04/28/2018 CLINICAL DATA:  37 year old female with possible biliary leak after cholecystectomy 04/03/2018. Abdominal pain vomiting and fever EXAM: NUCLEAR MEDICINE HEPATOBILIARY IMAGING TECHNIQUE: Sequential images of the abdomen were obtained out to 60 minutes following intravenous administration of radiopharmaceutical. RADIOPHARMACEUTICALS:  5.5 mCi Tc-100m  Choletec IV COMPARISON:  None. FINDINGS: Planar imaging was performed after administration of the radiotracer. The blood pool phase is unremarkable. There is uniform uptake of the radiotracer throughout the liver, however, the radiotracer does not excrete into the  biliary system after we 2 hours of planar imaging, with persistent uptake throughout liver parenchyma. No GI activity or biliary activity identified after 2 hours. IMPRESSION: Radiotracer uptake is uniform throughout the liver, though is never excreted into the biliary system or GI system, indicating primary liver dysfunction/medical liver disease. Note that the current study cannot rule out a bile leak. Electronically Signed   By: Corrie Mckusick D.O.   On:  04/28/2018 14:50   Ct Abdomen Pelvis W Contrast  Result Date: 04/28/2018 CLINICAL DATA:  Acute onset of right upper quadrant abdominal pain, nausea, vomiting and diarrhea. Fever. Recent cholecystectomy. EXAM: CT ABDOMEN AND PELVIS WITH CONTRAST TECHNIQUE: Multidetector CT imaging of the abdomen and pelvis was performed using the standard protocol following bolus administration of intravenous contrast. CONTRAST:  126mL ISOVUE-300 IOPAMIDOL (ISOVUE-300) INJECTION 61% COMPARISON:  CT of the abdomen and pelvis from 04/19/2018 FINDINGS: Lower chest: Minimal right basilar atelectasis is noted. The visualized portions of the mediastinum are unremarkable. Hepatobiliary: Residual fluid and trace air are noted at the gallbladder fossa, more prominent than on the prior study. However, no free fluid is seen tracking within the lower abdomen or pelvis to suggest a large bile leak. There is increased prominence of the intrahepatic biliary ducts, of uncertain significance. Would correlate with LFTs. The liver is otherwise unremarkable in appearance. Pancreas: The pancreas is within normal limits. Spleen: The spleen is unremarkable in appearance. Adrenals/Urinary Tract: The adrenal glands are unremarkable in appearance. The kidneys are within normal limits. There is no evidence of hydronephrosis. No renal or ureteral stones are identified. No perinephric stranding is seen. Stomach/Bowel: The stomach is unremarkable in appearance. The small bowel is within normal limits. The patient is status post appendectomy. The colon is unremarkable in appearance. Vascular/Lymphatic: The abdominal aorta is unremarkable in appearance. A circumaortic left renal vein is incidentally noted. The inferior vena cava is grossly unremarkable. No retroperitoneal lymphadenopathy is seen. No pelvic sidewall lymphadenopathy is identified. Reproductive: The uterus is mildly distended and unremarkable in appearance. A left-sided uterine fibroid is noted.  No suspicious adnexal masses are seen. Other: No additional soft tissue abnormalities are seen. Musculoskeletal: No acute osseous abnormalities are identified. The visualized musculature is unremarkable in appearance. IMPRESSION: 1. Residual fluid and trace air at the gallbladder fossa, increased from the prior study. However, no free fluid is seen tracking within the lower abdomen or pelvis to suggest a large bile leak. A small bile leak cannot be excluded; if the patient's symptoms persist, HIDA scan could be considered for further evaluation. 2. Increased prominence of the intrahepatic biliary ducts, of uncertain significance. Would correlate with LFTs. 3. Left-sided uterine fibroid noted. Electronically Signed   By: Garald Balding M.D.   On: 04/28/2018 01:41    Medications: Scheduled Meds: . HYDROmorphone   Intravenous Q4H  . insulin aspart  0-9 Units Subcutaneous Q4H  . insulin glargine  10 Units Subcutaneous Daily   Continuous Infusions: . sodium chloride 125 mL/hr at 04/29/18 1245  . cefTRIAXone (ROCEPHIN)  IV Stopped (04/29/18 1611)  . diphenhydrAMINE     PRN Meds:.albuterol, diphenhydrAMINE, diphenhydrAMINE-zinc acetate, hydrALAZINE, HYDROmorphone (DILAUDID) injection, ibuprofen, naloxone **AND** sodium chloride flush, ondansetron **OR** ondansetron (ZOFRAN) IV  Consultants:  General Surgery  Gastroenterology  Procedures:  None  Antibiotics:  Ceftriaxone  Assessment/Plan: Principal Problem:   Abdominal pain Active Problems:   HTN (hypertension), benign   Diabetes mellitus type 2 in obese ([place])   Acute cholecystitis s/p lap cholecystectomy 04/03/2018   Morbid obesity with  body mass index (BMI) of 50.0 to 59.9 in adult ([facility])   Transaminitis   Anemia  1. Abdominal Pain with Elevated LFTs: HIDA Scan and CT Abdomen reviewed by me. General Surgery recommends GI Consult, Patient wants to eat, having headache which she thinks is hunger-induced. GI Consult placed, discussed  with gastroenterology team, patient to have MRCP, ordered. Start patient on liquid diet, advance as tolerated 2. Hb Sickle Cell Disease with crisis: Continue IVF D5 .45% Saline @ 125 mls/hour, continue weight based Dilaudid PCA, IV Toradol 30 mg Q 6 H, Monitor vitals very closely, Re-evaluate pain scale regularly, 2 L of Oxygen by [place]. 3. Type 2 Diabetes Mellitus: Continue Insulin regimen with sliding scale. Monitor CBG 4. Hypertension: Will restart home meds once patient is tolerating PO intake. For now, continue prn IV Hydralazine 5. 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 Chronic pain Syndrome: Continue current medication regimen  Code Status: Full Code Family Communication: N/A Disposition Plan: Not yet ready for discharge  Jimmi Sidener  If 7PM-7AM, please contact night-coverage.  04/29/2018, 5:58 PM  LOS: 1 day

## 2018-04-30 ENCOUNTER — Ambulatory Visit (HOSPITAL_COMMUNITY)
Admit: 2018-04-30 | Discharge: 2018-04-30 | Disposition: A | Discharge status: Home / Self Care | Payer: Medicare Other | Attending: Internal Medicine | Admitting: Internal Medicine

## 2018-04-30 ENCOUNTER — Inpatient Hospital Stay (HOSPITAL_COMMUNITY): Payer: Medicare Other

## 2018-04-30 DIAGNOSIS — G8929 Other chronic pain: Secondary | ICD-10-CM

## 2018-04-30 DIAGNOSIS — R935 Abnormal findings on diagnostic imaging of other abdominal regions, including retroperitoneum: Secondary | ICD-10-CM

## 2018-04-30 DIAGNOSIS — R1011 Right upper quadrant pain: Secondary | ICD-10-CM

## 2018-04-30 LAB — COMPREHENSIVE METABOLIC PANEL
ALT: 495 U/L — ABNORMAL HIGH (ref 14–54)
AST: 364 U/L — ABNORMAL HIGH (ref 15–41)
Albumin: 2.7 g/dL — ABNORMAL LOW (ref 3.5–5.0)
Alkaline Phosphatase: 368 U/L — ABNORMAL HIGH (ref 38–126)
Anion gap: 7 (ref 5–15)
BUN: 6 mg/dL (ref 6–20)
CO2: 27 mmol/L (ref 22–32)
Calcium: 8.1 mg/dL — ABNORMAL LOW (ref 8.9–10.3)
Chloride: 103 mmol/L (ref 101–111)
Creatinine, Ser: 0.72 mg/dL (ref 0.44–1.00)
GFR calc Af Amer: >60 mL/min (ref >60)
GFR calc non Af Amer: >60 mL/min (ref >60)
Glucose, Bld: 122 mg/dL — ABNORMAL HIGH (ref 65–99)
Potassium: 3.4 mmol/L — ABNORMAL LOW (ref 3.5–5.1)
Sodium: 137 mmol/L (ref 135–145)
Total Bilirubin: 1.7 mg/dL — ABNORMAL HIGH (ref 0.3–1.2)
Total Protein: 6.6 g/dL (ref 6.5–8.1)

## 2018-04-30 LAB — GLUCOSE, CAPILLARY
Glucose-Capillary: 127 mg/dL — ABNORMAL HIGH (ref 65–99)
Glucose-Capillary: 137 mg/dL — ABNORMAL HIGH (ref 65–99)
Glucose-Capillary: 151 mg/dL — ABNORMAL HIGH (ref 65–99)
Glucose-Capillary: 157 mg/dL — ABNORMAL HIGH (ref 65–99)
Glucose-Capillary: 183 mg/dL — ABNORMAL HIGH (ref 65–99)
Glucose-Capillary: 234 mg/dL — ABNORMAL HIGH (ref 65–99)

## 2018-04-30 LAB — CBC WITH DIFFERENTIAL/PLATELET
Basophils Absolute: 0.1 10*3/uL (ref 0.0–0.1)
Basophils Relative: 1 %
Eosinophils Absolute: 0.2 10*3/uL (ref 0.0–0.7)
Eosinophils Relative: 2 %
HCT: 28.6 % — ABNORMAL LOW (ref 36.0–46.0)
Hemoglobin: 9 g/dL — ABNORMAL LOW (ref 12.0–15.0)
Lymphocytes Relative: 47 %
Lymphs Abs: 4.1 10*3/uL — ABNORMAL HIGH (ref 0.7–4.0)
MCH: 21.6 pg — ABNORMAL LOW (ref 26.0–34.0)
MCHC: 31.5 g/dL (ref 30.0–36.0)
MCV: 68.6 fL — ABNORMAL LOW (ref 78.0–100.0)
Monocytes Absolute: 0.5 10*3/uL (ref 0.1–1.0)
Monocytes Relative: 5 %
Neutro Abs: 4.1 10*3/uL (ref 1.7–7.7)
Neutrophils Relative %: 45 %
Platelets: 354 10*3/uL (ref 150–400)
RBC: 4.17 MIL/uL (ref 3.87–5.11)
RDW: 18.2 % — ABNORMAL HIGH (ref 11.5–15.5)
WBC: 9 10*3/uL (ref 4.0–10.5)

## 2018-04-30 LAB — LACTATE DEHYDROGENASE: LDH: 336 U/L — ABNORMAL HIGH (ref 98–192)

## 2018-04-30 MED ORDER — GADOBENATE DIMEGLUMINE 529 MG/ML IV SOLN
20.0000 mL | Freq: Once | INTRAVENOUS | Status: AC
  Administered 2018-04-30: 20 mL via INTRAVENOUS

## 2018-04-30 MED ORDER — LORAZEPAM 2 MG/ML IJ SOLN
1.0000 mg | Freq: Once | INTRAMUSCULAR | Status: AC
  Administered 2018-04-30: 1 mg via INTRAVENOUS
  Filled 2018-04-30: qty 1

## 2018-04-30 NOTE — Progress Notes (Signed)
Patient ID: Cynthia Rosales, female   DOB: 05/30/1981, 37 y.o.   MRN: [id number] Subjective:  Patient feels better today, pain is down but still localized to RUQ. No nausea or vomiting since 2 days. No fever. No chest pain, no SOB.  Objective:  Vital signs in last 24 hours:  Vitals:   04/30/18 0424 04/30/18 0816 04/30/18 1348 04/30/18 1727  BP:   133/75   Pulse:   73   Resp: 16 18 18 20   Temp:   98.2 F (36.8 C)   TempSrc:   Oral   SpO2: 99% 98% 97% 100%  Weight:      Height:       Intake/Output from previous day:   Intake/Output Summary (Last 24 hours) at 04/30/2018 1738 Last data filed at 04/30/2018 1349 Gross per 24 hour  Intake 500 ml  Output 1000 ml  Net -500 ml   Physical Exam: General: Alert, awake, oriented x3, in no acute distress.  HEENT: [place]/AT PEERL, EOMI Neck: Trachea midline,  no masses, no thyromegal,y no JVD, no carotid bruit OROPHARYNX:  Moist, No exudate/ erythema/lesions.  Heart: Regular rate and rhythm, without murmurs, rubs, gallops, PMI non-displaced, no heaves or thrills on palpation.  Lungs: Clear to auscultation, no wheezing or rhonchi noted. No increased vocal fremitus resonant to percussion  Abdomen: Soft, nontender, nondistended, positive bowel sounds, no masses no hepatosplenomegaly noted..  Neuro: No focal neurological deficits noted cranial nerves II through XII grossly intact. DTRs 2+ bilaterally upper and lower extremities. Strength 5 out of 5 in bilateral upper and lower extremities. Musculoskeletal: No warm swelling or erythema around joints, no spinal tenderness noted. Psychiatric: Patient alert and oriented x3, good insight and cognition, good recent to remote recall. Lymph node survey: No cervical axillary or inguinal lymphadenopathy noted.  Lab Results:  Basic Metabolic Panel:    Component Value Date/Time   NA 137 04/30/2018 0605   NA 140 11/19/2014 0933   K 3.4 (L) 04/30/2018 0605   CL 103 04/30/2018 0605   CO2 27 04/30/2018 0605   BUN 6 04/30/2018 0605   BUN 10 11/19/2014 0933   CREATININE 0.72 04/30/2018 0605   GLUCOSE 122 (H) 04/30/2018 0605   CALCIUM 8.1 (L) 04/30/2018 0605   CBC:    Component Value Date/Time   WBC 9.0 04/30/2018 0605   HGB 9.0 (L) 04/30/2018 0605   HCT 28.6 (L) 04/30/2018 0605   PLT 354 04/30/2018 0605   MCV 68.6 (L) 04/30/2018 0605   NEUTROABS 4.1 04/30/2018 0605   LYMPHSABS 4.1 (H) 04/30/2018 0605   MONOABS 0.5 04/30/2018 0605   EOSABS 0.2 04/30/2018 0605   BASOSABS 0.1 04/30/2018 0605    No results found for this or any previous visit (from the past 240 hour(s)).  Studies/Results: No results found.  Medications: Scheduled Meds: . HYDROmorphone   Intravenous Q4H  . insulin aspart  0-9 Units Subcutaneous Q4H  . insulin glargine  10 Units Subcutaneous Daily   Continuous Infusions: . cefTRIAXone (ROCEPHIN)  IV 2 g (04/30/18 1734)  . diphenhydrAMINE     PRN Meds:.albuterol, diphenhydrAMINE, diphenhydrAMINE-zinc acetate, hydrALAZINE, HYDROmorphone (DILAUDID) injection, ibuprofen, naloxone **AND** sodium chloride flush, ondansetron **OR** ondansetron (ZOFRAN) IV  Consultants:  General Surgery, Gastroenterology  Procedures:  None  Antibiotics:  Ceftriaxone  Assessment/Plan: Principal Problem:   Abdominal pain Active Problems:   HTN (hypertension), benign   Diabetes mellitus type 2 in obese ([place])   Acute cholecystitis s/p lap cholecystectomy 04/03/2018   Morbid obesity with body mass index (  BMI) of 50.0 to 59.9 in adult [facility])   Transaminitis   Anemia  1. Abdominal Pain with Elevated LFTs: Patient is status post laparoscopic cholecystectomy on 04/01/2018 for acute cholecystitis. Elevated Liver enzyme is slowly trending down. HIDA Scan and CT Abdomen reviewed by me. Patient to have MRCP today, she will have it done at [facility] because of her body habitus. Continue liquid diet, advance as tolerated. Subsequent management depends of MRCP findings and GI/Surgery  recommendation.  2. Hb Sickle Cell Disease with crisis: Continue IVF D5 .45% Saline @ 125 mls/hour, continue weight based Dilaudid PCA, IV Toradol 30 mg Q 6 H, Monitor vitals very closely, Re-evaluate pain scale regularly, 2 L of Oxygen by [place]. 3. Type 2 Diabetes Mellitus: Continue Insulin regimen with sliding scale. Monitor CBG 4. Hypertension: Will continue home meds. continue prn IV Hydralazine prn 5. 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 Chronic pain Syndrome: Continue current medication regimen  Code Status: Full Code Family Communication: N/A Disposition Plan: Not yet ready for discharge  Monike Bragdon  If 7PM-7AM, please contact night-coverage.  04/30/2018, 5:38 PM  LOS: 2 days

## 2018-04-30 NOTE — Progress Notes (Signed)
[facility] Progress Note     Subjective: CC:  Patient feels better.  She denies much pain but likes using her PCA.  She is hungry and wants to eat.  Waiting for GI visit.  They are aware.  MRCP ordered.  Objective: Vital signs in last 24 hours: Temp:  [97.5 F (36.4 C)-98.4 F (36.9 C)] 97.5 F (36.4 C) (06/01 0354) Pulse Rate:  [67-87] 67 (06/01 0354) Resp:  [13-20] 18 (06/01 0816) BP: (127-137)/(66-80) 129/72 (06/01 0354) SpO2:  [92 %-100 %] 98 % (06/01 0816) Last BM Date: 04/27/18  Intake/Output from previous day: 05/31 0701 - 06/01 0700 In: 1006.3 [I.V.:906.3; IV Piggyback:100] Out: 500 [Urine:500] Intake/Output this shift: No intake/output data recorded.  PE: Gen:  Alert, NAD, pleasant Card:  Regular rate and rhythm, pedal pulses 2+ BL Pulm:  Normal effort, clear to auscultation bilaterally, incisions c/d/i Abd: Soft, obese.  Abdomen seems nontender today.  Incision closed.  No guarding.  No peritonitis  Skin: warm and dry, no rashes  Psych: A&Ox3   Lab Results:  Recent Labs    04/29/18 1113 04/30/18 0605  WBC 9.8 9.0  HGB 9.5* 9.0*  HCT 30.3* 28.6*  PLT 405* 354   BMET Recent Labs    04/29/18 1113 04/30/18 0605  NA 136 137  K 3.8 3.4*  CL 103 103  CO2 25 27  GLUCOSE 132* 122*  BUN 7 6  CREATININE 0.78 0.72  CALCIUM 8.3* 8.1*   PT/INR Recent Labs    04/28/18 0636  LABPROT 13.4  INR 1.02   CMP     Component Value Date/Time   NA 137 04/30/2018 0605   NA 140 11/19/2014 0933   K 3.4 (L) 04/30/2018 0605   CL 103 04/30/2018 0605   CO2 27 04/30/2018 0605   GLUCOSE 122 (H) 04/30/2018 0605   BUN 6 04/30/2018 0605   BUN 10 11/19/2014 0933   CREATININE 0.72 04/30/2018 0605   CALCIUM 8.1 (L) 04/30/2018 0605   PROT 6.6 04/30/2018 0605   PROT 6.7 11/19/2014 0933   ALBUMIN 2.7 (L) 04/30/2018 0605   ALBUMIN 3.3 (L) 11/19/2014 0933   AST 364 (H) 04/30/2018 0605   ALT 495 (H) 04/30/2018 0605   ALKPHOS 368 (H) 04/30/2018 0605    BILITOT 1.7 (H) 04/30/2018 0605   GFRNONAA >60 04/30/2018 0605   GFRAA >60 04/30/2018 0605   Lipase     Component Value Date/Time   LIPASE 29 04/27/2018 2320       Studies/Results: Nm Hepatobiliary Liver Func  Result Date: 04/28/2018 CLINICAL DATA:  37 year old female with possible biliary leak after cholecystectomy 04/03/2018. Abdominal pain vomiting and fever EXAM: NUCLEAR MEDICINE HEPATOBILIARY IMAGING TECHNIQUE: Sequential images of the abdomen were obtained out to 60 minutes following intravenous administration of radiopharmaceutical. RADIOPHARMACEUTICALS:  5.5 mCi Tc-21m  Choletec IV COMPARISON:  None. FINDINGS: Planar imaging was performed after administration of the radiotracer. The blood pool phase is unremarkable. There is uniform uptake of the radiotracer throughout the liver, however, the radiotracer does not excrete into the biliary system after we 2 hours of planar imaging, with persistent uptake throughout liver parenchyma. No GI activity or biliary activity identified after 2 hours. IMPRESSION: Radiotracer uptake is uniform throughout the liver, though is never excreted into the biliary system or GI system, indicating primary liver dysfunction/medical liver disease. Note that the current study cannot rule out a bile leak. Electronically Signed   By: Corrie Mckusick D.O.   On: 04/28/2018 14:50  Anti-infectives: Anti-infectives (From admission, onward)   Start     Dose/Rate Route Frequency Ordered Stop   04/28/18 1500  cefTRIAXone (ROCEPHIN) 2 g in sodium chloride 0.9 % 100 mL IVPB     2 g 200 mL/hr over 30 Minutes Intravenous Every 24 hours 04/28/18 1055     04/28/18 0930  aztreonam (AZACTAM) 2 g in sodium chloride 0.9 % 100 mL IVPB     2 g 200 mL/hr over 30 Minutes Intravenous  Once 04/28/18 0929 04/28/18 1100     Assessment/Plan Abdominal pain S/p lap chole 5/3 -  Cholangiogram was unable to be performed during surgery.  A partial cholecystectomy was performed to  avoid injury to the CBD.  -  CT shows GB fossa fluid collection.  It is rather small and appears to be a simple fluid collection as an expected postoperative seroma that usually formed after every cholecystectomy.  - continue IV abx. Will follow and determine if repeat CT scan to evaluate fluid collection again is necessary.  -  HIDA - Radiotracer uptake is uniform throughout the liver but never excreted into biliary system.  Leak is doubtful.  - CBC and LFT's slowly improving.  Plan is for MRCP to better evaluate her liver biliary tract and pancreas.  If that is normal, see if she would benefit from prior endoscopy or gastric emptying study to explain her postoperative nausea vomiting and discomfort.  If not improved, consider repeat scan 5 days from the last 1 to see if fluid collection stable which is reassuring versus larger or gas-filled/infected and would benefit from percutaneous drainage.       LOS: 2 days    [name], M.D., F.A.C.S. Gastrointestinal and Minimally Invasive Surgery [facility] [address] [place], [place] [address] [phone] Main / Paging

## 2018-04-30 NOTE — Consult Note (Addendum)
Consultation  Referring Provider: [facility] hospitalist/ Jegede MD Primary Care Physician:  Medicine, [facility] Primary Gastroenterologist:  none  Reason for Consultation:  Abdominal  pain, elevated LFT's  HPI: Cynthia Rosales is a 37 y.o. female with history of adult onset diabetes mellitus, obesity, hypertension, and sickle cell disease(Patient says she has not had any symptoms in the past 6 to 7 months) Patient was initially admitted 04/01/2018 with acute cholecystitis with abrupt onset of severe right upper quadrant pain nausea and vomiting.  She underwent laparoscopic cholecystectomy on 04/03/2018 per Dr. Viona Gilmore field, [place] not able to be done due to extreme inflammatory changes, she was also stapled across the infundibulum due to extreme inflammation.. Patient says she improved initially after surgery and was getting better for the first couple of weeks and then had recurrence of right upper quadrant pain and vomiting.  She was seen in the ER on 04/18/2018 had CT of the abdomen and pelvis done which showed a fluid collection in the gallbladder fossa measuring 4.1 x 2.7 x 2.8 cm there is also mild scattered intrahepatic ductal dilation noted common bile duct within normal limits.  She was discharged back home and then readmitted on 04/28/2018 with persistent constant right upper quadrant pain that radiates to her back and intermittent nausea and vomiting.  She had not had any fever documented sweats at home.  She describes the pain as being constant steady sharp pain. CT of the abdomen and pelvis showed residual fluid and trace air in the gallbladder fossa more prominent than on the prior CT there is no fluid tracking to suggest bile leak.  There is increased intrahepatic ductal dilation, liver appears normal as does pancreas. HIDA scan was then done which showed no excretion into the biliary tree. Labs at initial presentation on 04/01/2018 T bili 2.7 alk phos 298 AST 679 and ALT of 850.  She  had normal liver tests in August 2018. Today's labs T bili 1.7 alk phos 368 AST 364 and ALT of 495. WBC of 9, hemoglobin 9 hematocrit of 28.6. She is comfortable with a PCA and is on Rocephin.  Nausea has been under control with antiemetics and she has been keeping down clear liquids.   Past Medical History:  Diagnosis Date  . Abscess of bursa, left elbow 06/2013   Treated with I and D/antibiotics.   . Anemia   . Asthma   . Depression   . History of blood transfusion    "after I had one of my kids"  . Hypertension   . Ovarian cyst   . Renal disorder    kidney stones  . Sickle cell disease ([place])   . Type II diabetes mellitus ([place])     Past Surgical History:  Procedure Laterality Date  . APPENDECTOMY  2013  . CARPAL TUNNEL RELEASE    . CESAREAN SECTION  2000; 2007; 2011  . CHOLECYSTECTOMY N/A 04/03/2018   Procedure: LAPAROSCOPIC CHOLECYSTECTOMY;  Surgeon: Rolm Bookbinder, MD;  Location: [facility];  Service: General;  Laterality: N/A;  . DILATION AND CURETTAGE OF UTERUS    . LAMINECTOMY N/A 12/13/2014   Procedure: CERVICAL LAMINECTOMY FOR INTRADURAL TUMOR;  Surgeon: Charlie Pitter, MD;  Location: [facility];  Service: Neurosurgery;  Laterality: N/A;  posterior  . LAPAROSCOPY N/A 07/20/2017   Procedure: LAPAROSCOPY OPERATIVE WITH WEDGE RESECTION RIGHT CORNUA AND PARTIAL SALPINGECTOMY;  Surgeon: [name], MD;  Location: [facility];  Service: Gynecology;  Laterality: N/A;  . REDUCTION  MAMMAPLASTY Bilateral 1998    Prior to Admission medications   Medication Sig Start Date End Date Taking? Authorizing Provider  AMITIZA 24 MCG capsule Take 24 mcg by mouth daily with breakfast.  04/27/18  Yes [provider]  hydrochlorothiazide (HYDRODIURIL) 25 MG tablet Take 25 mg by mouth daily.  04/27/18  Yes [provider]  insulin NPH-regular Human (NOVOLIN 70/30) (70-30) 100 UNIT/ML injection Inject 35-36 Units into the skin See admin instructions. Inject 35 units subcutaneously  after breakfast and 36 units after supper   Yes [provider]  metFORMIN (GLUCOPHAGE) 1000 MG tablet Take 1,000 mg by mouth daily with breakfast.  04/27/18  Yes [provider]  ondansetron (ZOFRAN ODT) 4 MG disintegrating tablet Take 1 tablet (4 mg total) by mouth every 8 (eight) hours as needed for nausea or vomiting. 04/19/18  Yes Lorin Glass, PA-C  oxycodone (ROXICODONE) 30 MG immediate release tablet Take 30 mg by mouth every 6 (six) hours as needed for pain.   Yes [provider]  sitaGLIPtin (JANUVIA) 100 MG tablet Take 100 mg by mouth daily.   Yes [provider]  albuterol (PROVENTIL HFA;VENTOLIN HFA) 108 (90 Base) MCG/ACT inhaler Inhale 2 puffs into the lungs every 6 (six) hours as needed for wheezing or shortness of breath.    [provider]  diazepam (VALIUM) 5 MG tablet take 1 tablet BY MOUTH THREE TIMES DAILY AS NEEDED Patient taking differently: Take 5 mg by mouth every 8 (eight) hours as needed for anxiety (pain). take 1 tablet BY MOUTH THREE TIMES DAILY AS NEEDED 04/27/18   Melvenia Beam, MD  hydrochlorothiazide (HYDRODIURIL) 12.5 MG tablet Take 1 tablet (12.5 mg total) by mouth daily. Patient not taking: Reported on 04/27/2018 04/06/18   Roxan Hockey, MD  Prenat w/o A Vit-FeFum-FePo-FA (CONCEPT OB) 130-92.4-1 MG CAPS Take 1 capsule by mouth daily. Patient not taking: Reported on 11/15/2017 08/09/17   [name], MD    Current Facility-Administered Medications  Medication Dose Route Frequency Provider Last Rate Last Dose  . 0.45 % sodium chloride infusion   Intravenous Continuous Tresa Garter, MD 125 mL/hr at 04/29/18 1245    . albuterol (PROVENTIL) (2.5 MG/3ML) 0.083% nebulizer solution 2.5 mg  2.5 mg Nebulization Q6H PRN Lenis Noon, RPH      . cefTRIAXone (ROCEPHIN) 2 g in sodium chloride 0.9 % 100 mL IVPB  2 g Intravenous Q24H Elwyn Reach, MD   Stopped at 04/29/18 1611  . diphenhydrAMINE (BENADRYL)  25 mg in sodium chloride 0.9 % 50 mL IVPB  25 mg Intravenous Q6H PRN Elwyn Reach, MD      . diphenhydrAMINE-zinc acetate (BENADRYL) 2-0.1 % cream   Topical TID PRN Gala Romney L, MD      . hydrALAZINE (APRESOLINE) injection 10 mg  10 mg Intravenous Q4H PRN Rise Patience, MD      . HYDROmorphone (DILAUDID) 1 mg/mL PCA injection   Intravenous Q4H Rise Patience, MD      . HYDROmorphone (DILAUDID) injection 2 mg  2 mg Intravenous Q2H PRN Rise Patience, MD      . ibuprofen (ADVIL,MOTRIN) tablet 400 mg  400 mg Oral Q6H PRN Angelica Chessman E, MD   400 mg at 04/29/18 1134  . insulin aspart (novoLOG) injection 0-9 Units  0-9 Units Subcutaneous Q4H Elwyn Reach, MD   2 Units at 04/30/18 0422  . insulin glargine (LANTUS) injection 10 Units  10 Units Subcutaneous  Daily Rise Patience, MD   10 Units at 04/29/18 1133  . naloxone [facility]) injection 0.4 mg  0.4 mg Intravenous PRN Rise Patience, MD       And  . sodium chloride flush (NS) 0.9 % injection 9 mL  9 mL Intravenous PRN Rise Patience, MD      . ondansetron [facility]) tablet 4 mg  4 mg Oral Q6H PRN Rise Patience, MD       Or  . ondansetron [facility]) injection 4 mg  4 mg Intravenous Q6H PRN Rise Patience, MD   4 mg at 04/28/18 2148    Allergies as of 04/27/2018 - Review Complete 04/27/2018  Allergen Reaction Noted  . Amoxicillin Hives, Rash, and Other (See Comments) 09/05/2013  . Buprenorphine hcl Itching and Nausea Only 07/11/2015  . Morphine and related Itching and Nausea Only 07/11/2015    Family History  Problem Relation Age of Onset  . Diabetes Mother   . Hypertension Mother   . Breast cancer Maternal Aunt   . Ovarian cancer Maternal Grandmother   . Breast cancer Maternal Aunt   . Migraines Neg Hx     Social History   Socioeconomic History  . Marital status: Single    Spouse name: Not on file  . Number of children: 3  . Years of education: 65  . Highest  education level: Not on file  Occupational History  . Occupation: Unemployed   Social Needs  . Financial resource strain: Not on file  . Food insecurity:    Worry: Not on file    Inability: Not on file  . Transportation needs:    Medical: Not on file    Non-medical: Not on file  Tobacco Use  . Smoking status: Former Smoker    Years: 0.00  . Smokeless tobacco: Never Used  . Tobacco comment: 03/27/2014 "smoked ~ 1 cigarette/day; quit in ~ 2013"  Substance and Sexual Activity  . Alcohol use: Yes    Alcohol/week: 0.0 oz  . Drug use: No  . Sexual activity: Yes    Birth control/protection: None  Lifestyle  . Physical activity:    Days per week: Not on file    Minutes per session: Not on file  . Stress: Not on file  Relationships  . Social connections:    Talks on phone: Not on file    Gets together: Not on file    Attends religious service: Not on file    Active member of club or organization: Not on file    Attends meetings of clubs or organizations: Not on file    Relationship status: Not on file  . Intimate partner violence:    Fear of current or ex partner: Not on file    Emotionally abused: Not on file    Physically abused: Not on file    Forced sexual activity: Not on file  Other Topics Concern  . Not on file  Social History Narrative   Patient lives at home with boyfriend.    Patient has 3 children    Patient does not work    Patient has an 11th grade education    Patient is right handed     Review of Systems: Pertinent positive and negative review of systems were noted in the above HPI section.  All other review of systems was otherwise negative.Marland Kitchen  Physical Exam: Vital signs in last 24 hours: Temp:  [97.5 F (36.4 C)-98.4 F (36.9 C)] 97.5 F (36.4  C) (06/01 0354) Pulse Rate:  [67-87] 67 (06/01 0354) Resp:  [13-20] 18 (06/01 0816) BP: (127-137)/(66-80) 129/72 (06/01 0354) SpO2:  [92 %-100 %] 98 % (06/01 0816) Last BM Date: 04/27/18 General:   Alert,   Well-developed, well-nourished, pleasant and cooperative in NAD Head:  Normocephalic and atraumatic. Eyes:  Sclera clear, no icterus.   Conjunctiva pink. Ears:  Normal auditory acuity. Nose:  No deformity, discharge,  or lesions. Mouth:  No deformity or lesions.   Neck:  Supple; no masses or thyromegaly. Lungs:  Clear throughout to auscultation.   No wheezes, crackles, or rhonchi. Heart:  Regular rate and rhythm; no murmurs, clicks, rubs,  or gallops. Abdomen:  Soft,nontender, BS active,nonpalp mass or hsm.   Rectal:  Deferred  Msk:  Symmetrical without gross deformities. . Pulses:  Normal pulses noted. Extremities:  Without clubbing or edema. Neurologic:  Alert and  oriented x4;  grossly normal neurologically. Skin:  Intact without significant lesions or rashes.. Psych:  Alert and cooperative. Normal mood and affect.  Intake/Output from previous day: 05/31 0701 - 06/01 0700 In: 1006.3 [I.V.:906.3; IV Piggyback:100] Out: 500 [Urine:500] Intake/Output this shift: No intake/output data recorded.  Lab Results: Recent Labs    04/28/18 0636 04/29/18 1113 04/30/18 0605  WBC 13.3* 9.8 9.0  HGB 8.4* 9.5* 9.0*  HCT 26.1* 30.3* 28.6*  PLT 458* 405* 354   BMET Recent Labs    04/28/18 0636 04/29/18 1113 04/30/18 0605  NA 134* 136 137  K 3.7 3.8 3.4*  CL 103 103 103  CO2 20* 25 27  GLUCOSE 272* 132* 122*  BUN '13 7 6  '$ CREATININE 0.87 0.78 0.72  CALCIUM 8.3* 8.3* 8.1*   LFT Recent Labs    04/28/18 0636  04/30/18 0605  PROT 7.7   < > 6.6  ALBUMIN 3.2*   < > 2.7*  AST 921*   < > 364*  ALT 620*   < > 495*  ALKPHOS 429*   < > 368*  BILITOT 2.2*   < > 1.7*  BILIDIR 1.2*  --   --   IBILI 1.0*  --   --    < > = values in this interval not displayed.   PT/INR Recent Labs    04/28/18 0636  LABPROT 13.4  INR 1.02     IMPRESSION:   [age] year old African-American female status post laparoscopic cholecystectomy on 04/01/2018 for acute cholecystitis.  She had a lot of  inflammation around the gallbladder at the time of surgery. Patient improved initially post operatively and then had recurrence of severe right upper quadrant pain nausea and vomiting which has been persistent since 04/18/2018.  She had elevated LFTs preoperatively and has had persistently elevated LFTs since.   Imaging has shown a fluid collection in the gallbladder fossa somewhat more prominent over the last 10 days, no tracking fluid to suggest ongoing bile leak.  Also noted increase in intrahepatic ductal dilation.  Rule out choledocholithiasis, rule out ductal injury  2 adult onset diabetes mellitus 3.  Sickle cell disease 4.  Morbid obesity 5.  Hypertension  Plan; continue Rocephin Have ordered MRCP to be done stat so will be done today.  Discussed situation at length with patient today and she is agreeable to proceed. Further plans pending results of MRCP.  Amy Esterwood  04/30/2018, 9:38 AM   GI ATTENDING  History, laboratories, x-rays, recent operative report reviewed. Patient personally seen and examined. Agree with comprehensive consultation note as outlined above. Asked to  see patient regarding abdominal pain and elevated liver tests. Underwent cholecystectomy earlier this month for acute cholecystitis. Markedly abnormal liver tests at that time. The operative report described possible Mirizzi's-type picture. Unable to obtain cholangiogram. Problems with pain since that time. Persistent abnormalities of liver test. Studies have shown persistent slightly enlarging fluid collection localized to the gallbladder bed. As well mild interval ductal dilation. Concerns would be choledocholithiasis, Mirizzi syndrome, or inadvertent bile duct injury. The next step to address these concerns his MRCP. Because of the patient's body habitus, she will be going to [facility] to have open MRI performed. Fortunately, she does not look ill, is afebrile with normal white blood cell count. Her abdomen is  minimally tender. We will continue to follow.  Docia Chuck. Geri Seminole., M.D. [facility] Division of Gastroenterology

## 2018-05-01 ENCOUNTER — Inpatient Hospital Stay (HOSPITAL_COMMUNITY): Payer: Medicare Other

## 2018-05-01 DIAGNOSIS — R188 Other ascites: Secondary | ICD-10-CM

## 2018-05-01 DIAGNOSIS — K8050 Calculus of bile duct without cholangitis or cholecystitis without obstruction: Secondary | ICD-10-CM

## 2018-05-01 DIAGNOSIS — R1084 Generalized abdominal pain: Secondary | ICD-10-CM

## 2018-05-01 LAB — GLUCOSE, CAPILLARY
Glucose-Capillary: 168 mg/dL — ABNORMAL HIGH (ref 65–99)
Glucose-Capillary: 186 mg/dL — ABNORMAL HIGH (ref 65–99)
Glucose-Capillary: 191 mg/dL — ABNORMAL HIGH (ref 65–99)
Glucose-Capillary: 191 mg/dL — ABNORMAL HIGH (ref 65–99)
Glucose-Capillary: 203 mg/dL — ABNORMAL HIGH (ref 65–99)
Glucose-Capillary: 225 mg/dL — ABNORMAL HIGH (ref 65–99)

## 2018-05-01 LAB — CBC WITH DIFFERENTIAL/PLATELET
Basophils Absolute: 0.1 10*3/uL (ref 0.0–0.1)
Basophils Relative: 1 %
Eosinophils Absolute: 0.2 10*3/uL (ref 0.0–0.7)
Eosinophils Relative: 2 %
HCT: 31.1 % — ABNORMAL LOW (ref 36.0–46.0)
Hemoglobin: 9.9 g/dL — ABNORMAL LOW (ref 12.0–15.0)
Lymphocytes Relative: 39 %
Lymphs Abs: 3.4 10*3/uL (ref 0.7–4.0)
MCH: 21.7 pg — ABNORMAL LOW (ref 26.0–34.0)
MCHC: 31.8 g/dL (ref 30.0–36.0)
MCV: 68.2 fL — ABNORMAL LOW (ref 78.0–100.0)
Monocytes Absolute: 0.4 10*3/uL (ref 0.1–1.0)
Monocytes Relative: 5 %
Neutro Abs: 4.6 10*3/uL (ref 1.7–7.7)
Neutrophils Relative %: 53 %
Platelets: 368 10*3/uL (ref 150–400)
RBC: 4.56 MIL/uL (ref 3.87–5.11)
RDW: 18.4 % — ABNORMAL HIGH (ref 11.5–15.5)
WBC: 8.7 10*3/uL (ref 4.0–10.5)

## 2018-05-01 LAB — COMPREHENSIVE METABOLIC PANEL
ALT: 497 U/L — ABNORMAL HIGH (ref 14–54)
AST: 377 U/L — ABNORMAL HIGH (ref 15–41)
Albumin: 2.9 g/dL — ABNORMAL LOW (ref 3.5–5.0)
Alkaline Phosphatase: 387 U/L — ABNORMAL HIGH (ref 38–126)
Anion gap: 10 (ref 5–15)
BUN: 6 mg/dL (ref 6–20)
CO2: 22 mmol/L (ref 22–32)
Calcium: 8.6 mg/dL — ABNORMAL LOW (ref 8.9–10.3)
Chloride: 104 mmol/L (ref 101–111)
Creatinine, Ser: 0.7 mg/dL (ref 0.44–1.00)
GFR calc Af Amer: >60 mL/min (ref >60)
GFR calc non Af Amer: >60 mL/min (ref >60)
Glucose, Bld: 183 mg/dL — ABNORMAL HIGH (ref 65–99)
Potassium: 3.7 mmol/L (ref 3.5–5.1)
Sodium: 136 mmol/L (ref 135–145)
Total Bilirubin: 1.7 mg/dL — ABNORMAL HIGH (ref 0.3–1.2)
Total Protein: 7 g/dL (ref 6.5–8.1)

## 2018-05-01 LAB — LIPASE, BLOOD: Lipase: 41 U/L (ref 11–51)

## 2018-05-01 MED ORDER — INDOMETHACIN 50 MG RE SUPP
100.0000 mg | Freq: Once | RECTAL | Status: AC
  Administered 2018-05-01: 100 mg via RECTAL
  Filled 2018-05-01: qty 2

## 2018-05-01 MED ORDER — FENTANYL CITRATE (PF) 100 MCG/2ML IJ SOLN
INTRAMUSCULAR | Status: AC
  Filled 2018-05-01: qty 4

## 2018-05-01 MED ORDER — MIDAZOLAM HCL 2 MG/2ML IJ SOLN
INTRAMUSCULAR | Status: AC | PRN
  Administered 2018-05-01 (×3): 1 mg via INTRAVENOUS

## 2018-05-01 MED ORDER — HYDROMORPHONE 1 MG/ML IV SOLN
INTRAVENOUS | Status: DC
  Administered 2018-05-01: 2.8 mg via INTRAVENOUS
  Administered 2018-05-02: 2.1 mg via INTRAVENOUS
  Administered 2018-05-02: 1.4 mg via INTRAVENOUS
  Administered 2018-05-02: 4.2 mg via INTRAVENOUS
  Administered 2018-05-02: 2.1 mg via INTRAVENOUS
  Administered 2018-05-03: 01:00:00 via INTRAVENOUS
  Administered 2018-05-03: 8.4 mg via INTRAVENOUS
  Administered 2018-05-03: 22:00:00 via INTRAVENOUS
  Administered 2018-05-03: 6.3 mg via INTRAVENOUS
  Administered 2018-05-03: 4.9 mg via INTRAVENOUS
  Administered 2018-05-04: 3.5 mg via INTRAVENOUS
  Administered 2018-05-04: 8.4 mg via INTRAVENOUS
  Administered 2018-05-04: 7 mg via INTRAVENOUS
  Filled 2018-05-01 (×2): qty 25

## 2018-05-01 MED ORDER — FENTANYL CITRATE (PF) 100 MCG/2ML IJ SOLN
INTRAMUSCULAR | Status: AC | PRN
  Administered 2018-05-01 (×2): 50 ug via INTRAVENOUS
  Administered 2018-05-01: 25 ug via INTRAVENOUS

## 2018-05-01 MED ORDER — SODIUM CHLORIDE 0.9% FLUSH
5.0000 mL | Freq: Three times a day (TID) | INTRAVENOUS | Status: DC
  Administered 2018-05-01 – 2018-05-04 (×8): 5 mL

## 2018-05-01 MED ORDER — MIDAZOLAM HCL 2 MG/2ML IJ SOLN
INTRAMUSCULAR | Status: AC
  Filled 2018-05-01: qty 4

## 2018-05-01 NOTE — Procedures (Signed)
Pre procedural Dx: Post op fluid collection Post procedural Dx: Same  Technically successful CT guided placed of a 10 Fr drainage catheter placement into the gallbladder fossa yielding 60 cc of bilious appearing fluid.    All aspirated samples sent to the laboratory for analysis (including bilirubin level)  EBL: Minimal  Complications: None immediate  Ronny Bacon, MD Pager #: [phone]

## 2018-05-01 NOTE — Progress Notes (Signed)
Chief Complaint: Patient was seen in consultation today for drainage of gallbladder fossa collection at the request of Dr. Michael Boston  Referring Physician(s):  Dr. Michael Boston  Supervising Physician: Sandi Mariscal  Patient Status: [facility] - In-pt  History of Present Illness: Cynthia Rosales is a 37 y.o. female s/p lap partial chole on 5/3. Pt with persistent abd discomfort and nausea, found to have fluid collection in GB fossa. HIDA equivocal but MRCP yesterday is suspicious for poss bile leak. Given these findings, Dr. Johney Maine has requested IR to perc drain fluid collection. Chart, imaging, labs, meds, allergies reviewed. Pt ate this am about 0800 but has been NPO since then.  Past Medical History:  Diagnosis Date  . Abscess of bursa, left elbow 06/2013   Treated with I and D/antibiotics.   . Anemia   . Asthma   . Depression   . History of blood transfusion    "after I had one of my kids"  . Hypertension   . Ovarian cyst   . Renal disorder    kidney stones  . Sickle cell disease ([place])   . Type II diabetes mellitus ([place])     Past Surgical History:  Procedure Laterality Date  . APPENDECTOMY  2013  . CARPAL TUNNEL RELEASE    . CESAREAN SECTION  2000; 2007; 2011  . CHOLECYSTECTOMY N/A 04/03/2018   Procedure: LAPAROSCOPIC CHOLECYSTECTOMY;  Surgeon: Rolm Bookbinder, MD;  Location: [place];  Service: General;  Laterality: N/A;  . DILATION AND CURETTAGE OF UTERUS    . LAMINECTOMY N/A 12/13/2014   Procedure: CERVICAL LAMINECTOMY FOR INTRADURAL TUMOR;  Surgeon: Charlie Pitter, MD;  Location: [facility];  Service: Neurosurgery;  Laterality: N/A;  posterior  . LAPAROSCOPY N/A 07/20/2017   Procedure: LAPAROSCOPY OPERATIVE WITH WEDGE RESECTION RIGHT CORNUA AND PARTIAL SALPINGECTOMY;  Surgeon: [name], MD;  Location: [facility];  Service: Gynecology;  Laterality: N/A;  . REDUCTION MAMMAPLASTY Bilateral 1998    Allergies: Amoxicillin; Buprenorphine hcl; and Morphine and  related  Medications:  Current Facility-Administered Medications:  .  albuterol (PROVENTIL) (2.5 MG/3ML) 0.083% nebulizer solution 2.5 mg, 2.5 mg, Nebulization, Q6H PRN, Lenis Noon, RPH .  cefTRIAXone (ROCEPHIN) 2 g in sodium chloride 0.9 % 100 mL IVPB, 2 g, Intravenous, Q24H, Elwyn Reach, MD, Stopped at 04/30/18 1814 .  diphenhydrAMINE (BENADRYL) 25 mg in sodium chloride 0.9 % 50 mL IVPB, 25 mg, Intravenous, Q6H PRN, Jonelle Sidle, Mohammad L, MD .  diphenhydrAMINE-zinc acetate (BENADRYL) 2-0.1 % cream, , Topical, TID PRN, Jonelle Sidle, Mohammad L, MD .  hydrALAZINE (APRESOLINE) injection 10 mg, 10 mg, Intravenous, Q4H PRN, Rise Patience, MD .  HYDROmorphone (DILAUDID) 1 mg/mL PCA injection, , Intravenous, Q4H, Rise Patience, MD, Stopped at 04/30/18 1200 .  HYDROmorphone (DILAUDID) injection 2 mg, 2 mg, Intravenous, Q2H PRN, Rise Patience, MD, 2 mg at 04/30/18 1735 .  ibuprofen (ADVIL,MOTRIN) tablet 400 mg, 400 mg, Oral, Q6H PRN, Doreene Burke, Olugbemiga E, MD, 400 mg at 04/29/18 1134 .  insulin aspart (novoLOG) injection 0-9 Units, 0-9 Units, Subcutaneous, Q4H, Gala Romney L, MD, 3 Units at 05/01/18 0800 .  insulin glargine (LANTUS) injection 10 Units, 10 Units, Subcutaneous, Daily, Rise Patience, MD, 10 Units at 05/01/18 1052 .  naloxone [facility]) injection 0.4 mg, 0.4 mg, Intravenous, PRN **AND** sodium chloride flush (NS) 0.9 % injection 9 mL, 9 mL, Intravenous, PRN, Rise Patience, MD .  ondansetron (ZOFRAN) tablet 4 mg, 4 mg, Oral, Q6H PRN **OR**  ondansetron (ZOFRAN) injection 4 mg, 4 mg, Intravenous, Q6H PRN, Rise Patience, MD, 4 mg at 04/28/18 2148    Family History  Problem Relation Age of Onset  . Diabetes Mother   . Hypertension Mother   . Breast cancer Maternal Aunt   . Ovarian cancer Maternal Grandmother   . Breast cancer Maternal Aunt   . Migraines Neg Hx     Social History   Socioeconomic History  . Marital status: Single    Spouse  name: Not on file  . Number of children: 3  . Years of education: 39  . Highest education level: Not on file  Occupational History  . Occupation: Unemployed   Social Needs  . Financial resource strain: Not on file  . Food insecurity:    Worry: Not on file    Inability: Not on file  . Transportation needs:    Medical: Not on file    Non-medical: Not on file  Tobacco Use  . Smoking status: Former Smoker    Years: 0.00  . Smokeless tobacco: Never Used  . Tobacco comment: 03/27/2014 "smoked ~ 1 cigarette/day; quit in ~ 2013"  Substance and Sexual Activity  . Alcohol use: Yes    Alcohol/week: 0.0 oz  . Drug use: No  . Sexual activity: Yes    Birth control/protection: None  Lifestyle  . Physical activity:    Days per week: Not on file    Minutes per session: Not on file  . Stress: Not on file  Relationships  . Social connections:    Talks on phone: Not on file    Gets together: Not on file    Attends religious service: Not on file    Active member of club or organization: Not on file    Attends meetings of clubs or organizations: Not on file    Relationship status: Not on file  Other Topics Concern  . Not on file  Social History Narrative   Patient lives at home with boyfriend.    Patient has 3 children    Patient does not work    Patient has an 11th grade education    Patient is right handed      Review of Systems: A 12 point ROS discussed and pertinent positives are indicated in the HPI above.  All other systems are negative.  Review of Systems  Vital Signs: BP 92/64 (BP Location: Right Arm)   Pulse 72   Temp 98.5 F (36.9 C) (Oral)   Resp 18   Ht 5\' 4"  (1.626 m)   Wt (!) 310 lb (140.6 kg)   LMP 06/18/2017 (Exact Date) Comment: negative beta CHG 04/27/17  SpO2 100%   BMI 53.21 kg/m   Physical Exam  Constitutional: She is oriented to person, place, and time. She appears well-developed. No distress.  HENT:  Head: Normocephalic.  Mouth/Throat: Oropharynx  is clear and moist.  Neck: Normal range of motion. No JVD present.  Cardiovascular: Normal rate, regular rhythm and normal heart sounds.  Pulmonary/Chest: Effort normal and breath sounds normal. No respiratory distress.  Abdominal: Soft. She exhibits no distension. There is no guarding.  Neurological: She is alert and oriented to person, place, and time.  Skin: Skin is warm and dry. She is not diaphoretic.  Psychiatric: She has a normal mood and affect.    Imaging: Nm Hepatobiliary Liver Func  Result Date: 04/28/2018 CLINICAL DATA:  37 year old female with possible biliary leak after cholecystectomy 04/03/2018. Abdominal pain vomiting and fever  EXAM: NUCLEAR MEDICINE HEPATOBILIARY IMAGING TECHNIQUE: Sequential images of the abdomen were obtained out to 60 minutes following intravenous administration of radiopharmaceutical. RADIOPHARMACEUTICALS:  5.5 mCi Tc-62m  Choletec IV COMPARISON:  None. FINDINGS: Planar imaging was performed after administration of the radiotracer. The blood pool phase is unremarkable. There is uniform uptake of the radiotracer throughout the liver, however, the radiotracer does not excrete into the biliary system after we 2 hours of planar imaging, with persistent uptake throughout liver parenchyma. No GI activity or biliary activity identified after 2 hours. IMPRESSION: Radiotracer uptake is uniform throughout the liver, though is never excreted into the biliary system or GI system, indicating primary liver dysfunction/medical liver disease. Note that the current study cannot rule out a bile leak. Electronically Signed   By: Corrie Mckusick D.O.   On: 04/28/2018 14:50   Ct Abdomen Pelvis W Contrast  Result Date: 04/28/2018 CLINICAL DATA:  Acute onset of right upper quadrant abdominal pain, nausea, vomiting and diarrhea. Fever. Recent cholecystectomy. EXAM: CT ABDOMEN AND PELVIS WITH CONTRAST TECHNIQUE: Multidetector CT imaging of the abdomen and pelvis was performed using the  standard protocol following bolus administration of intravenous contrast. CONTRAST:  133mL ISOVUE-300 IOPAMIDOL (ISOVUE-300) INJECTION 61% COMPARISON:  CT of the abdomen and pelvis from 04/19/2018 FINDINGS: Lower chest: Minimal right basilar atelectasis is noted. The visualized portions of the mediastinum are unremarkable. Hepatobiliary: Residual fluid and trace air are noted at the gallbladder fossa, more prominent than on the prior study. However, no free fluid is seen tracking within the lower abdomen or pelvis to suggest a large bile leak. There is increased prominence of the intrahepatic biliary ducts, of uncertain significance. Would correlate with LFTs. The liver is otherwise unremarkable in appearance. Pancreas: The pancreas is within normal limits. Spleen: The spleen is unremarkable in appearance. Adrenals/Urinary Tract: The adrenal glands are unremarkable in appearance. The kidneys are within normal limits. There is no evidence of hydronephrosis. No renal or ureteral stones are identified. No perinephric stranding is seen. Stomach/Bowel: The stomach is unremarkable in appearance. The small bowel is within normal limits. The patient is status post appendectomy. The colon is unremarkable in appearance. Vascular/Lymphatic: The abdominal aorta is unremarkable in appearance. A circumaortic left renal vein is incidentally noted. The inferior vena cava is grossly unremarkable. No retroperitoneal lymphadenopathy is seen. No pelvic sidewall lymphadenopathy is identified. Reproductive: The uterus is mildly distended and unremarkable in appearance. A left-sided uterine fibroid is noted. No suspicious adnexal masses are seen. Other: No additional soft tissue abnormalities are seen. Musculoskeletal: No acute osseous abnormalities are identified. The visualized musculature is unremarkable in appearance. IMPRESSION: 1. Residual fluid and trace air at the gallbladder fossa, increased from the prior study. However, no free  fluid is seen tracking within the lower abdomen or pelvis to suggest a large bile leak. A small bile leak cannot be excluded; if the patient's symptoms persist, HIDA scan could be considered for further evaluation. 2. Increased prominence of the intrahepatic biliary ducts, of uncertain significance. Would correlate with LFTs. 3. Left-sided uterine fibroid noted. Electronically Signed   By: Garald Balding M.D.   On: 04/28/2018 01:41   Ct Abdomen Pelvis W Contrast  Result Date: 04/19/2018 CLINICAL DATA:  Initial evaluation for acute generalized abdominal pain with nausea and vomiting. Recent cholecystectomy. EXAM: CT ABDOMEN AND PELVIS WITH CONTRAST TECHNIQUE: Multidetector CT imaging of the abdomen and pelvis was performed using the standard protocol following bolus administration of intravenous contrast. CONTRAST:  93mL ISOVUE-300 IOPAMIDOL (ISOVUE-300) INJECTION 61%,  174mL OMNIPAQUE IOHEXOL 300 MG/ML SOLN COMPARISON:  Prior CT and ultrasound from 04/01/2018. FINDINGS: Lower chest: Visualized lung bases are clear. Hepatobiliary: Liver demonstrates a normal contrast enhanced appearance. Patient is status post recent cholecystectomy. Small fluid collection with few foci of internal gas present within the gallbladder fossa, measuring approximately 4.1 x 2.7 x 2.8 cm. No significant rim enhancement or associated inflammatory changes. Mild scattered intrahepatic biliary dilatation, likely related post cholecystectomy changes. Common bile duct of normal caliber. Pancreas: Pancreas within normal limits. Spleen: Spleen within normal limits. Adrenals/Urinary Tract: Adrenal glands are normal. Kidneys equal in size with symmetric enhancement. No nephrolithiasis, hydronephrosis, or focal enhancing renal mass. No hydroureter. Bladder within normal limits. Stomach/Bowel: Stomach within normal limits. No evidence for bowel obstruction. Appendix not visualize, consistent with history prior appendectomy. No abnormal wall  thickening, mucosal enhancement, or inflammatory fat stranding seen about the bowels. Vascular/Lymphatic: Normal intravascular enhancement seen throughout the intra-abdominal aorta and its branch vessels. No aneurysm. No adenopathy. Reproductive: Probable fibroid uterus again noted, stable from previous. No other adnexal mass. Other: No free intraperitoneal air. No free fluid. Surgical clips noted within the umbilical region related to cholecystectomy. Soft tissue stranding within the subcutaneous fat of the mid and right ventral abdomen consistent with changes related to scope placement. No loculated collections or other complication from recent procedure. Musculoskeletal: No acute osseus abnormality. No worrisome lytic or blastic osseous lesions. IMPRESSION: 1. Postoperative changes from recent cholecystectomy. 4.1 x 2.7 x 2.8 cm collection within the gallbladder fossa of felt to be most consistent with a benign postoperative seroma. No significant inflammatory changes to suggest abscess or other complication. 2. No other acute intra-abdominal or pelvic process. 3. Fibroid uterus. Electronically Signed   By: Jeannine Boga M.D.   On: 04/19/2018 02:44   Ct Abdomen Pelvis W Contrast  Result Date: 04/01/2018 CLINICAL DATA:  Left-sided abdominal pain with nausea and vomiting. EXAM: CT ABDOMEN AND PELVIS WITH CONTRAST TECHNIQUE: Multidetector CT imaging of the abdomen and pelvis was performed using the standard protocol following bolus administration of intravenous contrast. CONTRAST:  174mL OMNIPAQUE IOHEXOL 300 MG/ML  SOLN COMPARISON:  CT scan dated 06/19/2014 FINDINGS: Lower chest: No acute abnormality. Hepatobiliary: No focal liver abnormality is seen. No gallstones, gallbladder wall thickening, or biliary dilatation. Pancreas: Unremarkable. No pancreatic ductal dilatation or surrounding inflammatory changes. Spleen: Normal in size without focal abnormality. Adrenals/Urinary Tract: Adrenal glands are  unremarkable. Kidneys are normal, without renal calculi, focal lesion, or hydronephrosis. Bladder is unremarkable. Stomach/Bowel: Stomach is within normal limits. Appendix has been removed. No evidence of bowel wall thickening, distention, or inflammatory changes. Vascular/Lymphatic: No significant vascular findings are present. No enlarged abdominal or pelvic lymph nodes. Reproductive: New 4.8 cm mass in left side of the uterus, statistically most likely a fibroid. No adnexal masses. Other: No abdominal wall hernia or abnormality. No abdominopelvic ascites. Musculoskeletal: No acute or significant osseous findings. IMPRESSION: No acute abnormalities of the abdomen or pelvis. New 4.8 cm mass in the left side of the uterus, most likely a fibroid. Electronically Signed   By: Lorriane Shire M.D.   On: 04/01/2018 14:14   Mr 3d Recon At Scanner  Result Date: 04/30/2018 CLINICAL DATA:  37 year old female with history of abnormal liver function tests. Cholelithiasis. EXAM: MRI ABDOMEN WITHOUT AND WITH CONTRAST (INCLUDING MRCP) TECHNIQUE: Multiplanar multisequence MR imaging of the abdomen was performed both before and after the administration of intravenous contrast. Heavily T2-weighted images of the biliary and pancreatic ducts were obtained, and  three-dimensional MRCP images were rendered by post processing. CONTRAST:  67mL MULTIHANCE GADOBENATE DIMEGLUMINE 529 MG/ML IV SOLN COMPARISON:  No prior abdominal MRI. CT the abdomen and pelvis 04/28/2018. FINDINGS: Comment: Portions of the examination are limited by considerable patient respiratory motion. Lower chest: Unremarkable. Hepatobiliary: Status post cholecystectomy. Complex postoperative fluid collection in the gallbladder fossa measuring approximately 4.5 x 5.4 x 5.2 cm (axial image 21 of series 19 and coronal image 29 of series 11) which demonstrates heterogeneous T1 and T2 signal intensity but is generally T1 hypointense and T2 hyperintense with internal areas  of slightly increased T1 hypointensity and decreased T2 intensity. This collection does not demonstrate internal enhancement, and is most compatible with a biloma. No other suspicious hepatic lesions are noted. There multiple filling defects within the common bile duct, compatible with choledocholithiasis. Common bile duct is dilated measuring up to 10 mm in the porta hepatis. There is also some mild intrahepatic biliary ductal dilatation. Pancreas: No pancreatic mass. No pancreatic ductal dilatation noted on MRCP images. No pancreatic or peripancreatic fluid or inflammatory changes. Spleen:  Unremarkable. Adrenals/Urinary Tract: Bilateral kidneys and bilateral adrenal glands are normal in appearance. No hydroureteronephrosis in the visualized portions of the abdomen. Stomach/Bowel: Visualized portions are unremarkable. Vascular/Lymphatic: No aneurysm identified in the visualized abdominal vasculature. No lymphadenopathy noted in the abdomen. Other: No significant volume of ascites noted in the visualized portions of the peritoneal cavity. Musculoskeletal: No aggressive appearing osseous lesions are noted in the visualized portions of the skeleton. IMPRESSION: 1. Choledocholithiasis with mild intra and extrahepatic biliary ductal dilatation concerning for biliary tract obstruction. 2. Persistent postoperative fluid collection in the gallbladder fossa in this patient status post cholecystectomy, concerning for potential biloma. This could be further evaluated with HIDA scan if clinically appropriate. Electronically Signed   By: Vinnie Langton M.D.   On: 04/30/2018 17:54   Mr Abdomen Mrcp Moise Boring Contast  Result Date: 04/30/2018 CLINICAL DATA:  37 year old female with history of abnormal liver function tests. Cholelithiasis. EXAM: MRI ABDOMEN WITHOUT AND WITH CONTRAST (INCLUDING MRCP) TECHNIQUE: Multiplanar multisequence MR imaging of the abdomen was performed both before and after the administration of intravenous  contrast. Heavily T2-weighted images of the biliary and pancreatic ducts were obtained, and three-dimensional MRCP images were rendered by post processing. CONTRAST:  35mL MULTIHANCE GADOBENATE DIMEGLUMINE 529 MG/ML IV SOLN COMPARISON:  No prior abdominal MRI. CT the abdomen and pelvis 04/28/2018. FINDINGS: Comment: Portions of the examination are limited by considerable patient respiratory motion. Lower chest: Unremarkable. Hepatobiliary: Status post cholecystectomy. Complex postoperative fluid collection in the gallbladder fossa measuring approximately 4.5 x 5.4 x 5.2 cm (axial image 21 of series 19 and coronal image 29 of series 11) which demonstrates heterogeneous T1 and T2 signal intensity but is generally T1 hypointense and T2 hyperintense with internal areas of slightly increased T1 hypointensity and decreased T2 intensity. This collection does not demonstrate internal enhancement, and is most compatible with a biloma. No other suspicious hepatic lesions are noted. There multiple filling defects within the common bile duct, compatible with choledocholithiasis. Common bile duct is dilated measuring up to 10 mm in the porta hepatis. There is also some mild intrahepatic biliary ductal dilatation. Pancreas: No pancreatic mass. No pancreatic ductal dilatation noted on MRCP images. No pancreatic or peripancreatic fluid or inflammatory changes. Spleen:  Unremarkable. Adrenals/Urinary Tract: Bilateral kidneys and bilateral adrenal glands are normal in appearance. No hydroureteronephrosis in the visualized portions of the abdomen. Stomach/Bowel: Visualized portions are unremarkable. Vascular/Lymphatic: No aneurysm identified  in the visualized abdominal vasculature. No lymphadenopathy noted in the abdomen. Other: No significant volume of ascites noted in the visualized portions of the peritoneal cavity. Musculoskeletal: No aggressive appearing osseous lesions are noted in the visualized portions of the skeleton.  IMPRESSION: 1. Choledocholithiasis with mild intra and extrahepatic biliary ductal dilatation concerning for biliary tract obstruction. 2. Persistent postoperative fluid collection in the gallbladder fossa in this patient status post cholecystectomy, concerning for potential biloma. This could be further evaluated with HIDA scan if clinically appropriate. Electronically Signed   By: Vinnie Langton M.D.   On: 04/30/2018 17:54   US Abdomen Limited Ruq  Result Date: 04/01/2018 CLINICAL DATA:  Right upper quadrant pain. EXAM: ULTRASOUND ABDOMEN LIMITED RIGHT UPPER QUADRANT COMPARISON:  CT scan dated 04/01/2018 FINDINGS: Gallbladder: Multiple gallstones. No gallbladder wall thickening. Positive sonographic Murphy's sign. Common bile duct: Diameter: 4 mm, normal. Liver: No focal lesion identified. Within normal limits in parenchymal echogenicity. Portal vein is patent on color Doppler imaging with normal direction of blood flow towards the liver. IMPRESSION: Cholelithiasis. Positive sonographic Murphy's sign. Findings suggest acute cholecystitis. Electronically Signed   By: Lorriane Shire M.D.   On: 04/01/2018 16:32    Labs:  CBC: Recent Labs    04/28/18 0636 04/29/18 1113 04/30/18 0605 05/01/18 0530  WBC 13.3* 9.8 9.0 8.7  HGB 8.4* 9.5* 9.0* 9.9*  HCT 26.1* 30.3* 28.6* 31.1*  PLT 458* 405* 354 368    COAGS: Recent Labs    04/01/18 1914 04/28/18 0636  INR 1.06 1.02  APTT 20*  --     BMP: Recent Labs    04/28/18 0636 04/29/18 1113 04/30/18 0605 05/01/18 0530  NA 134* 136 137 136  K 3.7 3.8 3.4* 3.7  CL 103 103 103 104  CO2 20* 25 27 22   GLUCOSE 272* 132* 122* 183*  BUN 13 7 6 6   CALCIUM 8.3* 8.3* 8.1* 8.6*  CREATININE 0.87 0.78 0.72 0.70  GFRNONAA >60 >60 >60 >60  GFRAA >60 >60 >60 >60    LIVER FUNCTION TESTS: Recent Labs    04/28/18 0636 04/29/18 1113 04/30/18 0605 05/01/18 0530  BILITOT 2.2* 3.0* 1.7* 1.7*  AST 921* 520* 364* 377*  ALT 620* 607* 495* 497*   ALKPHOS 429* 435* 368* 387*  PROT 7.7 7.6 6.6 7.0  ALBUMIN 3.2* 3.0* 2.7* 2.9*    TUMOR MARKERS: No results for input(s): AFPTM, CEA, CA199, CHROMGRNA in the last 8760 hours.  Assessment and Plan: S/p lap partial cholecystectomy 5/3 Residual GB fossa fluid collection, possible biloma vs seroma Plan for CT guided perc drain today. Labs reviewed. Risks and benefits discussed with the patient including bleeding, infection, damage to adjacent structures, bowel perforation/fistula connection, and sepsis.  All of the patient's questions were answered, patient is agreeable to proceed. Consent signed and in chart.    Thank you for this interesting consult.  I greatly enjoyed meeting Makhayla Mcmurry and look forward to participating in their care.  A copy of this report was sent to the requesting provider on this date.  Electronically Signed: Ascencion Dike, PA-C 05/01/2018, 11:37 AM   I spent a total of 20 minutes in face to face in clinical consultation, greater than 50% of which was counseling/coordinating care for gb fossa drain

## 2018-05-01 NOTE — Anesthesia Preprocedure Evaluation (Addendum)
Anesthesia Evaluation  Patient identified by MRN, date of birth, ID band Patient awake    Reviewed: Allergy & Precautions, NPO status , Patient's Chart, lab work & pertinent test results  History of Anesthesia Complications Negative for: history of anesthetic complications  Airway Mallampati: I  TM Distance: >3 FB Neck ROM: Full    Dental  (+) Dental Advisory Given   Pulmonary COPD,  COPD inhaler, former smoker,    breath sounds clear to auscultation       Cardiovascular hypertension, Pt. 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  Pt does walk.. Chronic pain: narcotics    GI/Hepatic GERD  Medicated,Elevated LFTs   Endo/Other  diabetes (glu 121), Insulin DependentMorbid obesity  Renal/GU negative Renal ROS     Musculoskeletal   Abdominal (+) + obese,   Peds  Hematology  (+) Blood dyscrasia (Hb 9.9), Sickle cell anemia and anemia ,   Anesthesia Other Findings   Reproductive/Obstetrics                          Anesthesia Physical Anesthesia Plan  ASA: III  Anesthesia Plan: General   Post-op Pain Management:    Induction: Intravenous and Cricoid pressure planned  PONV Risk Score and Plan: 3 and Ondansetron, Dexamethasone and Scopolamine patch - Pre-op  Airway Management Planned: Oral ETT  Additional Equipment:   Intra-op Plan:   Post-operative Plan: Extubation in OR  Informed Consent: I have reviewed the patients History and Physical, chart, labs and discussed the procedure including the risks, benefits and alternatives for the proposed anesthesia with the patient or authorized representative who has indicated his/her understanding and acceptance.   Dental advisory  given  Plan Discussed with: CRNA and Surgeon  Anesthesia Plan Comments: (Plan routine monitors, GETA)       Anesthesia Quick Evaluation

## 2018-05-01 NOTE — Progress Notes (Signed)
Patient ID: Cynthia Rosales, female   DOB: 08-01-1981, 37 y.o.   MRN: [id number] Subjective:  Patient has no new complaint but still having significant pain. She is scheduled for procedure today based on MRCP findings, patient asked questions concerning the procedure and expectations. She has no fever, nausea and vomiting has resolved. No chest pain, no SOB.  Objective:  Vital signs in last 24 hours:  Vitals:   05/01/18 1450 05/01/18 1455 05/01/18 1505 05/01/18 1537  BP: (!) 152/88 140/85 (!) 160/90 (!) 135/94  Pulse: 95 98 96 90  Resp: 20 20 16 18   Temp:    97.9 F (36.6 C)  TempSrc:    Oral  SpO2: 95% 96% 96% 100%  Weight:      Height:        Intake/Output from previous day:   Intake/Output Summary (Last 24 hours) at 05/01/2018 1618 Last data filed at 05/01/2018 1500 Gross per 24 hour  Intake 480 ml  Output 1000 ml  Net -520 ml    Physical Exam: General: Alert, awake, oriented x3, in no acute distress.  HEENT: [place]/AT PEERL, EOMI Neck: Trachea midline,  no masses, no thyromegal,y no JVD, no carotid bruit OROPHARYNX:  Moist, No exudate/ erythema/lesions.  Heart: Regular rate and rhythm, without murmurs, rubs, gallops, PMI non-displaced, no heaves or thrills on palpation.  Lungs: Clear to auscultation, no wheezing or rhonchi noted. No increased vocal fremitus resonant to percussion  Abdomen: Soft, ++ tender RUQ, nondistended, positive bowel sounds, no masses no hepatosplenomegaly noted..  Neuro: No focal neurological deficits noted cranial nerves II through XII grossly intact. DTRs 2+ bilaterally upper and lower extremities. Strength 5 out of 5 in bilateral upper and lower extremities. Musculoskeletal: No warm swelling or erythema around joints, no spinal tenderness noted. Psychiatric: Patient alert and oriented x3, good insight and cognition, good recent to remote recall. Lymph node survey: No cervical axillary or inguinal lymphadenopathy noted.  Lab Results:  Basic Metabolic  Panel:    Component Value Date/Time   NA 136 05/01/2018 0530   NA 140 11/19/2014 0933   K 3.7 05/01/2018 0530   CL 104 05/01/2018 0530   CO2 22 05/01/2018 0530   BUN 6 05/01/2018 0530   BUN 10 11/19/2014 0933   CREATININE 0.70 05/01/2018 0530   GLUCOSE 183 (H) 05/01/2018 0530   CALCIUM 8.6 (L) 05/01/2018 0530   CBC:    Component Value Date/Time   WBC 8.7 05/01/2018 0530   HGB 9.9 (L) 05/01/2018 0530   HCT 31.1 (L) 05/01/2018 0530   PLT 368 05/01/2018 0530   MCV 68.2 (L) 05/01/2018 0530   NEUTROABS 4.6 05/01/2018 0530   LYMPHSABS 3.4 05/01/2018 0530   MONOABS 0.4 05/01/2018 0530   EOSABS 0.2 05/01/2018 0530   BASOSABS 0.1 05/01/2018 0530    No results found for this or any previous visit (from the past 240 hour(s)).  Studies/Results: Mr 3d Recon At Scanner  Result Date: 04/30/2018 CLINICAL DATA:  37 year old female with history of abnormal liver function tests. Cholelithiasis. EXAM: MRI ABDOMEN WITHOUT AND WITH CONTRAST (INCLUDING MRCP) TECHNIQUE: Multiplanar multisequence MR imaging of the abdomen was performed both before and after the administration of intravenous contrast. Heavily T2-weighted images of the biliary and pancreatic ducts were obtained, and three-dimensional MRCP images were rendered by post processing. CONTRAST:  10mL MULTIHANCE GADOBENATE DIMEGLUMINE 529 MG/ML IV SOLN COMPARISON:  No prior abdominal MRI. CT the abdomen and pelvis 04/28/2018. FINDINGS: Comment: Portions of the examination are limited by considerable patient  respiratory motion. Lower chest: Unremarkable. Hepatobiliary: Status post cholecystectomy. Complex postoperative fluid collection in the gallbladder fossa measuring approximately 4.5 x 5.4 x 5.2 cm (axial image 21 of series 19 and coronal image 29 of series 11) which demonstrates heterogeneous T1 and T2 signal intensity but is generally T1 hypointense and T2 hyperintense with internal areas of slightly increased T1 hypointensity and decreased T2  intensity. This collection does not demonstrate internal enhancement, and is most compatible with a biloma. No other suspicious hepatic lesions are noted. There multiple filling defects within the common bile duct, compatible with choledocholithiasis. Common bile duct is dilated measuring up to 10 mm in the porta hepatis. There is also some mild intrahepatic biliary ductal dilatation. Pancreas: No pancreatic mass. No pancreatic ductal dilatation noted on MRCP images. No pancreatic or peripancreatic fluid or inflammatory changes. Spleen:  Unremarkable. Adrenals/Urinary Tract: Bilateral kidneys and bilateral adrenal glands are normal in appearance. No hydroureteronephrosis in the visualized portions of the abdomen. Stomach/Bowel: Visualized portions are unremarkable. Vascular/Lymphatic: No aneurysm identified in the visualized abdominal vasculature. No lymphadenopathy noted in the abdomen. Other: No significant volume of ascites noted in the visualized portions of the peritoneal cavity. Musculoskeletal: No aggressive appearing osseous lesions are noted in the visualized portions of the skeleton. IMPRESSION: 1. Choledocholithiasis with mild intra and extrahepatic biliary ductal dilatation concerning for biliary tract obstruction. 2. Persistent postoperative fluid collection in the gallbladder fossa in this patient status post cholecystectomy, concerning for potential biloma. This could be further evaluated with HIDA scan if clinically appropriate. Electronically Signed   By: Vinnie Langton M.D.   On: 04/30/2018 17:54   Mr Abdomen Mrcp Moise Boring Contast  Result Date: 04/30/2018 CLINICAL DATA:  37 year old female with history of abnormal liver function tests. Cholelithiasis. EXAM: MRI ABDOMEN WITHOUT AND WITH CONTRAST (INCLUDING MRCP) TECHNIQUE: Multiplanar multisequence MR imaging of the abdomen was performed both before and after the administration of intravenous contrast. Heavily T2-weighted images of the biliary and  pancreatic ducts were obtained, and three-dimensional MRCP images were rendered by post processing. CONTRAST:  28mL MULTIHANCE GADOBENATE DIMEGLUMINE 529 MG/ML IV SOLN COMPARISON:  No prior abdominal MRI. CT the abdomen and pelvis 04/28/2018. FINDINGS: Comment: Portions of the examination are limited by considerable patient respiratory motion. Lower chest: Unremarkable. Hepatobiliary: Status post cholecystectomy. Complex postoperative fluid collection in the gallbladder fossa measuring approximately 4.5 x 5.4 x 5.2 cm (axial image 21 of series 19 and coronal image 29 of series 11) which demonstrates heterogeneous T1 and T2 signal intensity but is generally T1 hypointense and T2 hyperintense with internal areas of slightly increased T1 hypointensity and decreased T2 intensity. This collection does not demonstrate internal enhancement, and is most compatible with a biloma. No other suspicious hepatic lesions are noted. There multiple filling defects within the common bile duct, compatible with choledocholithiasis. Common bile duct is dilated measuring up to 10 mm in the porta hepatis. There is also some mild intrahepatic biliary ductal dilatation. Pancreas: No pancreatic mass. No pancreatic ductal dilatation noted on MRCP images. No pancreatic or peripancreatic fluid or inflammatory changes. Spleen:  Unremarkable. Adrenals/Urinary Tract: Bilateral kidneys and bilateral adrenal glands are normal in appearance. No hydroureteronephrosis in the visualized portions of the abdomen. Stomach/Bowel: Visualized portions are unremarkable. Vascular/Lymphatic: No aneurysm identified in the visualized abdominal vasculature. No lymphadenopathy noted in the abdomen. Other: No significant volume of ascites noted in the visualized portions of the peritoneal cavity. Musculoskeletal: No aggressive appearing osseous lesions are noted in the visualized portions of the skeleton.  IMPRESSION: 1. Choledocholithiasis with mild intra and  extrahepatic biliary ductal dilatation concerning for biliary tract obstruction. 2. Persistent postoperative fluid collection in the gallbladder fossa in this patient status post cholecystectomy, concerning for potential biloma. This could be further evaluated with HIDA scan if clinically appropriate. Electronically Signed   By: Vinnie Langton M.D.   On: 04/30/2018 17:54   Ct Image Guided Drainage By Percutaneous Catheter  Result Date: 05/01/2018 INDICATION: Recent cholecystectomy, now with persistent fluid collection within the gallbladder fossa Please perform image guided percutaneous drainage catheter placement for infection source control purposes EXAM: ULTRASOUND AND CT GUIDED PERCUTANEOUS DRAINAGE CATHETER PLACEMENT INTO THE GALLBLADDER FOSSA COMPARISON:  CT abdomen and pelvis -0 04/28/2018; MRCP - 04/30/2018; nuclear medicine HIDA scan - 04/28/2018 MEDICATIONS: The patient is currently admitted to the hospital and receiving intravenous antibiotics. The antibiotics were administered within an appropriate time frame prior to the initiation of the procedure. ANESTHESIA/SEDATION: Moderate (conscious) sedation was employed during this procedure. A total of Versed 4 mg and Fentanyl 200 mcg was administered intravenously. Moderate Sedation Time: 20 minutes. The patient's level of consciousness and vital signs were monitored continuously by radiology nursing throughout the procedure under my direct supervision. CONTRAST:  None COMPLICATIONS: None immediate. PROCEDURE: Informed written consent was obtained from the patient after a discussion of the risks, benefits and alternatives to treatment. The patient was placed supine on the CT gantry and a pre procedural CT was performed re-demonstrating the known abscess/fluid collection within the gallbladder fossa with dominant component measuring approximately 6.0 x 4.9 cm (image 20, series 2). Sonographic evaluation was performed of the right upper abdominal quadrant  adequately demonstrating the location of the gallbladder fossa fluid collection. The procedure was planned. A timeout was performed prior to the initiation of the procedure. The skin overlying the right upper abdomen was prepped and draped in the usual sterile fashion. The overlying soft tissues were anesthetized with 1% lidocaine with epinephrine. Under direct ultrasound guidance, an 18 gauge trocar needle was advanced into the abscess/fluid collection and a short Amplatz super stiff wire was coiled within the collection. Appropriate positioning was confirmed with a limited CT scan. The tract was serially dilated allowing placement of a 10 [place] all-purpose drainage catheter. Appropriate positioning was confirmed with a limited postprocedural CT scan. Approximately 60 cc of bilious appearing non foul smelling fluid was aspirated. The tube was connected to a drainage bag and sutured in place. A dressing was placed. The patient tolerated the procedure well without immediate post procedural complication. IMPRESSION: Successful ultrasound and CT guided placement of a 10 French all purpose drain catheter into the gallbladder fossa fluid collection with aspiration of 60 cc of bilious appearing, non foul smelling fluid. Samples were sent to the laboratory (including for the acquisition of bilirubin levels) as requested by the ordering clinical team. Electronically Signed   By: Sandi Mariscal M.D.   On: 05/01/2018 15:34    Medications: Scheduled Meds: . fentaNYL      . HYDROmorphone   Intravenous Q4H  . indomethacin  100 mg Rectal Once  . insulin aspart  0-9 Units Subcutaneous Q4H  . insulin glargine  10 Units Subcutaneous Daily  . midazolam      . sodium chloride flush  5 mL Intracatheter Q8H   Continuous Infusions: . cefTRIAXone (ROCEPHIN)  IV Stopped (04/30/18 1814)  . diphenhydrAMINE     PRN Meds:.albuterol, diphenhydrAMINE, diphenhydrAMINE-zinc acetate, hydrALAZINE, HYDROmorphone (DILAUDID) injection,  ibuprofen, ondansetron **OR** ondansetron (ZOFRAN) IV  Consultants:  GI/GS  Procedures:  MRCP, CT Guided percutaneous bile drainage, ERCP with stone extraction scheduled for tomorrow 6/3  Antibiotics:  IV Ceftriaxone  Assessment/Plan: Principal Problem:   Abdominal pain Active Problems:   HTN (hypertension), benign   Diabetes mellitus type 2 in obese ([place])   Acute cholecystitis s/p lap cholecystectomy 04/03/2018   Morbid obesity with body mass index (BMI) of 50.0 to 59.9 in adult ([facility])   Transaminitis   Anemia  1. Abdominal Pain with Elevated LFTs from Choledocholithiasis and Biloma: MRCP showed choledocholithiasis and biloma. She is scheduled for percutaneous CT Guided drainage today and possible ERCP for stone extractions tomorrow. Will continue IV antibiotics, adjust Dilaudid via PCA to reflect weigh-based. Continue IVF and other therapies per gastroenterologist and general surgery.  2. Hb Sickle Cell Disease with crisis: Continue IVF D5 .45% Saline @ 125 mls/hour, increase dosage of weight based Dilaudid PCA, continue IV Toradol 30 mg Q 6 H, Monitor vitals very closely, Re-evaluate pain scale regularly, 2 L of Oxygen by [place]. 3. Type 2 Diabetes Mellitus:Continue Insulin regimen with sliding scale. Monitor CBG 4. Hypertension: Will continue home meds. continue prn IV Hydralazine prn 5. 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 Chronic pain Syndrome:Continue current medication regimen  Code Status: Full Code Family Communication: N/A Disposition Plan: Not yet ready for discharge  Evangela Heffler  If 7PM-7AM, please contact night-coverage.  05/01/2018, 4:18 PM  LOS: 3 days

## 2018-05-01 NOTE — Progress Notes (Signed)
[facility] Progress Note     Subjective: CC:  Still with pain.  Likes to PCA.  Lives with chronic pain now.  MRCP done.  Just tried breakfast.  Less nauseated but still uncomfortable.  Objective: Vital signs in last 24 hours: Temp:  [98.1 F (36.7 C)-98.9 F (37.2 C)] 98.5 F (36.9 C) (06/02 0436) Pulse Rate:  [72-93] 72 (06/02 0436) Resp:  [14-22] 20 (06/02 0505) BP: (92-155)/(51-75) 92/64 (06/02 0436) SpO2:  [97 %-100 %] 100 % (06/02 0505) Last BM Date: 04/27/18  Intake/Output from previous day: 06/01 0701 - 06/02 0700 In: 480 [P.O.:480] Out: 1450 [Urine:1450] Intake/Output this shift: No intake/output data recorded.  PE: Gen:  Alert, NAD, pleasant Card:  Regular rate and rhythm, pedal pulses 2+ BL Pulm:  Normal effort, clear to auscultation bilaterally, incisions c/d/i Abd: Soft, obese.  Tenderness to palpation right upper quadrant only.  No real Murphy sign.  Incision closed.  No guarding.  No peritonitis  Skin: warm and dry, no rashes  Psych: A&Ox3   Lab Results:  Recent Labs    04/30/18 0605 05/01/18 0530  WBC 9.0 8.7  HGB 9.0* 9.9*  HCT 28.6* 31.1*  PLT 354 368   BMET Recent Labs    04/30/18 0605 05/01/18 0530  NA 137 136  K 3.4* 3.7  CL 103 104  CO2 27 22  GLUCOSE 122* 183*  BUN 6 6  CREATININE 0.72 0.70  CALCIUM 8.1* 8.6*   PT/INR No results for input(s): LABPROT, INR in the last 72 hours. CMP     Component Value Date/Time   NA 136 05/01/2018 0530   NA 140 11/19/2014 0933   K 3.7 05/01/2018 0530   CL 104 05/01/2018 0530   CO2 22 05/01/2018 0530   GLUCOSE 183 (H) 05/01/2018 0530   BUN 6 05/01/2018 0530   BUN 10 11/19/2014 0933   CREATININE 0.70 05/01/2018 0530   CALCIUM 8.6 (L) 05/01/2018 0530   PROT 7.0 05/01/2018 0530   PROT 6.7 11/19/2014 0933   ALBUMIN 2.9 (L) 05/01/2018 0530   ALBUMIN 3.3 (L) 11/19/2014 0933   AST 377 (H) 05/01/2018 0530   ALT 497 (H) 05/01/2018 0530   ALKPHOS 387 (H) 05/01/2018 0530   BILITOT 1.7 (H) 05/01/2018 0530   GFRNONAA >60 05/01/2018 0530   GFRAA >60 05/01/2018 0530   Lipase     Component Value Date/Time   LIPASE 41 05/01/2018 0530       Studies/Results: Mr 3d Recon At Scanner  Result Date: 04/30/2018 CLINICAL DATA:  37 year old female with history of abnormal liver function tests. Cholelithiasis. EXAM: MRI ABDOMEN WITHOUT AND WITH CONTRAST (INCLUDING MRCP) TECHNIQUE: Multiplanar multisequence MR imaging of the abdomen was performed both before and after the administration of intravenous contrast. Heavily T2-weighted images of the biliary and pancreatic ducts were obtained, and three-dimensional MRCP images were rendered by post processing. CONTRAST:  38mL MULTIHANCE GADOBENATE DIMEGLUMINE 529 MG/ML IV SOLN COMPARISON:  No prior abdominal MRI. CT the abdomen and pelvis 04/28/2018. FINDINGS: Comment: Portions of the examination are limited by considerable patient respiratory motion. Lower chest: Unremarkable. Hepatobiliary: Status post cholecystectomy. Complex postoperative fluid collection in the gallbladder fossa measuring approximately 4.5 x 5.4 x 5.2 cm (axial image 21 of series 19 and coronal image 29 of series 11) which demonstrates heterogeneous T1 and T2 signal intensity but is generally T1 hypointense and T2 hyperintense with internal areas of slightly increased T1 hypointensity and decreased T2 intensity. This collection does not demonstrate internal  enhancement, and is most compatible with a biloma. No other suspicious hepatic lesions are noted. There multiple filling defects within the common bile duct, compatible with choledocholithiasis. Common bile duct is dilated measuring up to 10 mm in the porta hepatis. There is also some mild intrahepatic biliary ductal dilatation. Pancreas: No pancreatic mass. No pancreatic ductal dilatation noted on MRCP images. No pancreatic or peripancreatic fluid or inflammatory changes. Spleen:  Unremarkable. Adrenals/Urinary Tract:  Bilateral kidneys and bilateral adrenal glands are normal in appearance. No hydroureteronephrosis in the visualized portions of the abdomen. Stomach/Bowel: Visualized portions are unremarkable. Vascular/Lymphatic: No aneurysm identified in the visualized abdominal vasculature. No lymphadenopathy noted in the abdomen. Other: No significant volume of ascites noted in the visualized portions of the peritoneal cavity. Musculoskeletal: No aggressive appearing osseous lesions are noted in the visualized portions of the skeleton. IMPRESSION: 1. Choledocholithiasis with mild intra and extrahepatic biliary ductal dilatation concerning for biliary tract obstruction. 2. Persistent postoperative fluid collection in the gallbladder fossa in this patient status post cholecystectomy, concerning for potential biloma. This could be further evaluated with HIDA scan if clinically appropriate. Electronically Signed   By: Vinnie Langton M.D.   On: 04/30/2018 17:54   Mr Abdomen Mrcp Moise Boring Contast  Result Date: 04/30/2018 CLINICAL DATA:  37 year old female with history of abnormal liver function tests. Cholelithiasis. EXAM: MRI ABDOMEN WITHOUT AND WITH CONTRAST (INCLUDING MRCP) TECHNIQUE: Multiplanar multisequence MR imaging of the abdomen was performed both before and after the administration of intravenous contrast. Heavily T2-weighted images of the biliary and pancreatic ducts were obtained, and three-dimensional MRCP images were rendered by post processing. CONTRAST:  78mL MULTIHANCE GADOBENATE DIMEGLUMINE 529 MG/ML IV SOLN COMPARISON:  No prior abdominal MRI. CT the abdomen and pelvis 04/28/2018. FINDINGS: Comment: Portions of the examination are limited by considerable patient respiratory motion. Lower chest: Unremarkable. Hepatobiliary: Status post cholecystectomy. Complex postoperative fluid collection in the gallbladder fossa measuring approximately 4.5 x 5.4 x 5.2 cm (axial image 21 of series 19 and coronal image 29 of  series 11) which demonstrates heterogeneous T1 and T2 signal intensity but is generally T1 hypointense and T2 hyperintense with internal areas of slightly increased T1 hypointensity and decreased T2 intensity. This collection does not demonstrate internal enhancement, and is most compatible with a biloma. No other suspicious hepatic lesions are noted. There multiple filling defects within the common bile duct, compatible with choledocholithiasis. Common bile duct is dilated measuring up to 10 mm in the porta hepatis. There is also some mild intrahepatic biliary ductal dilatation. Pancreas: No pancreatic mass. No pancreatic ductal dilatation noted on MRCP images. No pancreatic or peripancreatic fluid or inflammatory changes. Spleen:  Unremarkable. Adrenals/Urinary Tract: Bilateral kidneys and bilateral adrenal glands are normal in appearance. No hydroureteronephrosis in the visualized portions of the abdomen. Stomach/Bowel: Visualized portions are unremarkable. Vascular/Lymphatic: No aneurysm identified in the visualized abdominal vasculature. No lymphadenopathy noted in the abdomen. Other: No significant volume of ascites noted in the visualized portions of the peritoneal cavity. Musculoskeletal: No aggressive appearing osseous lesions are noted in the visualized portions of the skeleton. IMPRESSION: 1. Choledocholithiasis with mild intra and extrahepatic biliary ductal dilatation concerning for biliary tract obstruction. 2. Persistent postoperative fluid collection in the gallbladder fossa in this patient status post cholecystectomy, concerning for potential biloma. This could be further evaluated with HIDA scan if clinically appropriate. Electronically Signed   By: Vinnie Langton M.D.   On: 04/30/2018 17:54    Anti-infectives: Anti-infectives (From admission, onward)   Start  Dose/Rate Route Frequency Ordered Stop   04/28/18 1500  cefTRIAXone (ROCEPHIN) 2 g in sodium chloride 0.9 % 100 mL IVPB     2  g 200 mL/hr over 30 Minutes Intravenous Every 24 hours 04/28/18 1055     04/28/18 0930  aztreonam (AZACTAM) 2 g in sodium chloride 0.9 % 100 mL IVPB     2 g 200 mL/hr over 30 Minutes Intravenous  Once 04/28/18 0929 04/28/18 1100     Assessment/Plan Abdominal pain S/p lap chole 04/01/18 -  Cholangiogram was unable to be performed during surgery.  A partial cholecystectomy was performed to avoid injury to the CBD.  - GB fossa fluid collection cholecystectomy bed.Marland Kitchen    HIDA scan equivocal due to poor liver uptake.  MRCP done last night can more suspicious for biloma.  Collection not smaller.  Because:  the fluid collection persists   seems more suspicious for biloma by MRCP HIDA scan not able to rule this in or out   liver function tsts still elevated THERFORE would proceed with percutaneous drainage.  See if it can be done later today versus tomorrow.    If that is consistent with seroma then well defer to gastroenterology to see if they have any other explanations for her nausea or vomiting.  Challenge with her chronic pain control.  Will defer to primary service if they wish to consider weaning off PCA.         LOS: 3 days    [name], M.D., F.A.C.S. Gastrointestinal and Minimally Invasive Surgery [facility] [address] [place], [place] [address] [phone] Main / Paging

## 2018-05-01 NOTE — Progress Notes (Addendum)
Patient ID: Cynthia Rosales, female   DOB: 02-22-1981, 37 y.o.   MRN: [id number]     Progress Note   Subjective   Able to eat , nausea resolved - pain about the same - using PCA Worrying about when she might be able to get out of hospital - several events this week with children graduating , birthdays etc   Objective   Vital signs in last 24 hours: Temp:  [98.1 F (36.7 C)-98.9 F (37.2 C)] 98.5 F (36.9 C) (06/02 0436) Pulse Rate:  [72-93] 72 (06/02 0436) Resp:  [14-22] 20 (06/02 0505) BP: (92-155)/(51-75) 92/64 (06/02 0436) SpO2:  [97 %-100 %] 100 % (06/02 0505) Last BM Date: 04/27/18 General:    AA female in NAD Heart:  Regular rate and rhythm; no murmurs Lungs: Respirations even and unlabored, lungs CTA bilaterally Abdomen:  Soft, tender  RUQ and epigastrium,and nondistended. Normal bowel sounds. Extremities:  Without edema. Neurologic:  Alert and oriented,  grossly normal neurologically. Psych:  Cooperative. Normal mood and affect.  Intake/Output from previous day: 06/01 0701 - 06/02 0700 In: 480 [P.O.:480] Out: 1450 [Urine:1450] Intake/Output this shift: No intake/output data recorded.  Lab Results: Recent Labs    04/29/18 1113 04/30/18 0605 05/01/18 0530  WBC 9.8 9.0 8.7  HGB 9.5* 9.0* 9.9*  HCT 30.3* 28.6* 31.1*  PLT 405* 354 368   BMET Recent Labs    04/29/18 1113 04/30/18 0605 05/01/18 0530  NA 136 137 136  K 3.8 3.4* 3.7  CL 103 103 104  CO2 25 27 22   GLUCOSE 132* 122* 183*  BUN 7 6 6   CREATININE 0.78 0.72 0.70  CALCIUM 8.3* 8.1* 8.6*   LFT Recent Labs    05/01/18 0530  PROT 7.0  ALBUMIN 2.9*  AST 377*  ALT 497*  ALKPHOS 387*  BILITOT 1.7*   PT/INR No results for input(s): LABPROT, INR in the last 72 hours.  Studies/Results: Mr 3d Recon At Scanner  Result Date: 04/30/2018 CLINICAL DATA:  37 year old female with history of abnormal liver function tests. Cholelithiasis. EXAM: MRI ABDOMEN WITHOUT AND WITH CONTRAST (INCLUDING MRCP)  TECHNIQUE: Multiplanar multisequence MR imaging of the abdomen was performed both before and after the administration of intravenous contrast. Heavily T2-weighted images of the biliary and pancreatic ducts were obtained, and three-dimensional MRCP images were rendered by post processing. CONTRAST:  50mL MULTIHANCE GADOBENATE DIMEGLUMINE 529 MG/ML IV SOLN COMPARISON:  No prior abdominal MRI. CT the abdomen and pelvis 04/28/2018. FINDINGS: Comment: Portions of the examination are limited by considerable patient respiratory motion. Lower chest: Unremarkable. Hepatobiliary: Status post cholecystectomy. Complex postoperative fluid collection in the gallbladder fossa measuring approximately 4.5 x 5.4 x 5.2 cm (axial image 21 of series 19 and coronal image 29 of series 11) which demonstrates heterogeneous T1 and T2 signal intensity but is generally T1 hypointense and T2 hyperintense with internal areas of slightly increased T1 hypointensity and decreased T2 intensity. This collection does not demonstrate internal enhancement, and is most compatible with a biloma. No other suspicious hepatic lesions are noted. There multiple filling defects within the common bile duct, compatible with choledocholithiasis. Common bile duct is dilated measuring up to 10 mm in the porta hepatis. There is also some mild intrahepatic biliary ductal dilatation. Pancreas: No pancreatic mass. No pancreatic ductal dilatation noted on MRCP images. No pancreatic or peripancreatic fluid or inflammatory changes. Spleen:  Unremarkable. Adrenals/Urinary Tract: Bilateral kidneys and bilateral adrenal glands are normal in appearance. No hydroureteronephrosis in the visualized portions of the  abdomen. Stomach/Bowel: Visualized portions are unremarkable. Vascular/Lymphatic: No aneurysm identified in the visualized abdominal vasculature. No lymphadenopathy noted in the abdomen. Other: No significant volume of ascites noted in the visualized portions of the  peritoneal cavity. Musculoskeletal: No aggressive appearing osseous lesions are noted in the visualized portions of the skeleton. IMPRESSION: 1. Choledocholithiasis with mild intra and extrahepatic biliary ductal dilatation concerning for biliary tract obstruction. 2. Persistent postoperative fluid collection in the gallbladder fossa in this patient status post cholecystectomy, concerning for potential biloma. This could be further evaluated with HIDA scan if clinically appropriate. Electronically Signed   By: Vinnie Langton M.D.   On: 04/30/2018 17:54   Mr Abdomen Mrcp Moise Boring Contast  Result Date: 04/30/2018 CLINICAL DATA:  37 year old female with history of abnormal liver function tests. Cholelithiasis. EXAM: MRI ABDOMEN WITHOUT AND WITH CONTRAST (INCLUDING MRCP) TECHNIQUE: Multiplanar multisequence MR imaging of the abdomen was performed both before and after the administration of intravenous contrast. Heavily T2-weighted images of the biliary and pancreatic ducts were obtained, and three-dimensional MRCP images were rendered by post processing. CONTRAST:  82mL MULTIHANCE GADOBENATE DIMEGLUMINE 529 MG/ML IV SOLN COMPARISON:  No prior abdominal MRI. CT the abdomen and pelvis 04/28/2018. FINDINGS: Comment: Portions of the examination are limited by considerable patient respiratory motion. Lower chest: Unremarkable. Hepatobiliary: Status post cholecystectomy. Complex postoperative fluid collection in the gallbladder fossa measuring approximately 4.5 x 5.4 x 5.2 cm (axial image 21 of series 19 and coronal image 29 of series 11) which demonstrates heterogeneous T1 and T2 signal intensity but is generally T1 hypointense and T2 hyperintense with internal areas of slightly increased T1 hypointensity and decreased T2 intensity. This collection does not demonstrate internal enhancement, and is most compatible with a biloma. No other suspicious hepatic lesions are noted. There multiple filling defects within the common  bile duct, compatible with choledocholithiasis. Common bile duct is dilated measuring up to 10 mm in the porta hepatis. There is also some mild intrahepatic biliary ductal dilatation. Pancreas: No pancreatic mass. No pancreatic ductal dilatation noted on MRCP images. No pancreatic or peripancreatic fluid or inflammatory changes. Spleen:  Unremarkable. Adrenals/Urinary Tract: Bilateral kidneys and bilateral adrenal glands are normal in appearance. No hydroureteronephrosis in the visualized portions of the abdomen. Stomach/Bowel: Visualized portions are unremarkable. Vascular/Lymphatic: No aneurysm identified in the visualized abdominal vasculature. No lymphadenopathy noted in the abdomen. Other: No significant volume of ascites noted in the visualized portions of the peritoneal cavity. Musculoskeletal: No aggressive appearing osseous lesions are noted in the visualized portions of the skeleton. IMPRESSION: 1. Choledocholithiasis with mild intra and extrahepatic biliary ductal dilatation concerning for biliary tract obstruction. 2. Persistent postoperative fluid collection in the gallbladder fossa in this patient status post cholecystectomy, concerning for potential biloma. This could be further evaluated with HIDA scan if clinically appropriate. Electronically Signed   By: Vinnie Langton M.D.   On: 04/30/2018 17:54       Assessment / Plan:     #1 37 yo morbidly obese AA female  S/p lap chole for acute cholecystitis 04/01/2018 readmitted with persistently elevated LFT's ,N/V and recurrent severe RUQ pain.  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  Unclear whether her pain is secondary to CBD stones or  Biloma , or both. #2 Sickle cell disease  #3 AODM  Plan; Pt needs ERCP with stone extraction - will schedule for Monday with Dr Loletha Carrow -Procedure  was discussed in detail with pt and her Mother this morning, including indications  ,risks and benefits and she is agreeable to proceed.  Continue IV ABX  Pt likely will also need the Biloma drained- will defer to surgery regarding timing Contact  Amy Esterwood, P.A.-C               [phone] Principal Problem:   Abdominal pain Active Problems:   HTN (hypertension), benign   Diabetes mellitus type 2 in obese ([place])   Acute cholecystitis s/p lap cholecystectomy 04/03/2018   Morbid obesity with body mass index (BMI) of 50.0 to 59.9 in adult [facility])   Transaminitis   Anemia   LOS: 3 days   Amy Esterwood  05/01/2018, 8:46 AM   GI ATTENDING  Interval history and data, including MRCP personally reviewed. Patient seen and examined. Agree with above comprehensive interval progress note as outlined. MRCP confirms choledocholithiasis. For ERCP tomorrow. Currently arranged with endoscopy and general anesthesia at 8:30 AM with Dr. Loletha Carrow. The patient is higher than baseline risk due to her body habitus. Continue antibiotics. General surgery has decided to drain the enlarging organized fluid collection in the gallbladder fossa. Appears this will be done later today.  Docia Chuck. Geri Seminole., M.D. [facility] Division of Gastroenterology

## 2018-05-01 NOTE — H&P (View-Only) (Signed)
Patient ID: Cynthia Rosales, female   DOB: 18-May-1981, 37 y.o.   MRN: [id number]     Progress Note   Subjective   Able to eat , nausea resolved - pain about the same - using PCA Worrying about when she might be able to get out of hospital - several events this week with children graduating , birthdays etc   Objective   Vital signs in last 24 hours: Temp:  [98.1 F (36.7 C)-98.9 F (37.2 C)] 98.5 F (36.9 C) (06/02 0436) Pulse Rate:  [72-93] 72 (06/02 0436) Resp:  [14-22] 20 (06/02 0505) BP: (92-155)/(51-75) 92/64 (06/02 0436) SpO2:  [97 %-100 %] 100 % (06/02 0505) Last BM Date: 04/27/18 General:    AA female in NAD Heart:  Regular rate and rhythm; no murmurs Lungs: Respirations even and unlabored, lungs CTA bilaterally Abdomen:  Soft, tender  RUQ and epigastrium,and nondistended. Normal bowel sounds. Extremities:  Without edema. Neurologic:  Alert and oriented,  grossly normal neurologically. Psych:  Cooperative. Normal mood and affect.  Intake/Output from previous day: 06/01 0701 - 06/02 0700 In: 480 [P.O.:480] Out: 1450 [Urine:1450] Intake/Output this shift: No intake/output data recorded.  Lab Results: Recent Labs    04/29/18 1113 04/30/18 0605 05/01/18 0530  WBC 9.8 9.0 8.7  HGB 9.5* 9.0* 9.9*  HCT 30.3* 28.6* 31.1*  PLT 405* 354 368   BMET Recent Labs    04/29/18 1113 04/30/18 0605 05/01/18 0530  NA 136 137 136  K 3.8 3.4* 3.7  CL 103 103 104  CO2 25 27 22   GLUCOSE 132* 122* 183*  BUN 7 6 6   CREATININE 0.78 0.72 0.70  CALCIUM 8.3* 8.1* 8.6*   LFT Recent Labs    05/01/18 0530  PROT 7.0  ALBUMIN 2.9*  AST 377*  ALT 497*  ALKPHOS 387*  BILITOT 1.7*   PT/INR No results for input(s): LABPROT, INR in the last 72 hours.  Studies/Results: Mr 3d Recon At Scanner  Result Date: 04/30/2018 CLINICAL DATA:  37 year old female with history of abnormal liver function tests. Cholelithiasis. EXAM: MRI ABDOMEN WITHOUT AND WITH CONTRAST (INCLUDING MRCP)  TECHNIQUE: Multiplanar multisequence MR imaging of the abdomen was performed both before and after the administration of intravenous contrast. Heavily T2-weighted images of the biliary and pancreatic ducts were obtained, and three-dimensional MRCP images were rendered by post processing. CONTRAST:  53mL MULTIHANCE GADOBENATE DIMEGLUMINE 529 MG/ML IV SOLN COMPARISON:  No prior abdominal MRI. CT the abdomen and pelvis 04/28/2018. FINDINGS: Comment: Portions of the examination are limited by considerable patient respiratory motion. Lower chest: Unremarkable. Hepatobiliary: Status post cholecystectomy. Complex postoperative fluid collection in the gallbladder fossa measuring approximately 4.5 x 5.4 x 5.2 cm (axial image 21 of series 19 and coronal image 29 of series 11) which demonstrates heterogeneous T1 and T2 signal intensity but is generally T1 hypointense and T2 hyperintense with internal areas of slightly increased T1 hypointensity and decreased T2 intensity. This collection does not demonstrate internal enhancement, and is most compatible with a biloma. No other suspicious hepatic lesions are noted. There multiple filling defects within the common bile duct, compatible with choledocholithiasis. Common bile duct is dilated measuring up to 10 mm in the porta hepatis. There is also some mild intrahepatic biliary ductal dilatation. Pancreas: No pancreatic mass. No pancreatic ductal dilatation noted on MRCP images. No pancreatic or peripancreatic fluid or inflammatory changes. Spleen:  Unremarkable. Adrenals/Urinary Tract: Bilateral kidneys and bilateral adrenal glands are normal in appearance. No hydroureteronephrosis in the visualized portions of the  abdomen. Stomach/Bowel: Visualized portions are unremarkable. Vascular/Lymphatic: No aneurysm identified in the visualized abdominal vasculature. No lymphadenopathy noted in the abdomen. Other: No significant volume of ascites noted in the visualized portions of the  peritoneal cavity. Musculoskeletal: No aggressive appearing osseous lesions are noted in the visualized portions of the skeleton. IMPRESSION: 1. Choledocholithiasis with mild intra and extrahepatic biliary ductal dilatation concerning for biliary tract obstruction. 2. Persistent postoperative fluid collection in the gallbladder fossa in this patient status post cholecystectomy, concerning for potential biloma. This could be further evaluated with HIDA scan if clinically appropriate. Electronically Signed   By: Vinnie Langton M.D.   On: 04/30/2018 17:54   Mr Abdomen Mrcp Moise Boring Contast  Result Date: 04/30/2018 CLINICAL DATA:  38 year old female with history of abnormal liver function tests. Cholelithiasis. EXAM: MRI ABDOMEN WITHOUT AND WITH CONTRAST (INCLUDING MRCP) TECHNIQUE: Multiplanar multisequence MR imaging of the abdomen was performed both before and after the administration of intravenous contrast. Heavily T2-weighted images of the biliary and pancreatic ducts were obtained, and three-dimensional MRCP images were rendered by post processing. CONTRAST:  78mL MULTIHANCE GADOBENATE DIMEGLUMINE 529 MG/ML IV SOLN COMPARISON:  No prior abdominal MRI. CT the abdomen and pelvis 04/28/2018. FINDINGS: Comment: Portions of the examination are limited by considerable patient respiratory motion. Lower chest: Unremarkable. Hepatobiliary: Status post cholecystectomy. Complex postoperative fluid collection in the gallbladder fossa measuring approximately 4.5 x 5.4 x 5.2 cm (axial image 21 of series 19 and coronal image 29 of series 11) which demonstrates heterogeneous T1 and T2 signal intensity but is generally T1 hypointense and T2 hyperintense with internal areas of slightly increased T1 hypointensity and decreased T2 intensity. This collection does not demonstrate internal enhancement, and is most compatible with a biloma. No other suspicious hepatic lesions are noted. There multiple filling defects within the common  bile duct, compatible with choledocholithiasis. Common bile duct is dilated measuring up to 10 mm in the porta hepatis. There is also some mild intrahepatic biliary ductal dilatation. Pancreas: No pancreatic mass. No pancreatic ductal dilatation noted on MRCP images. No pancreatic or peripancreatic fluid or inflammatory changes. Spleen:  Unremarkable. Adrenals/Urinary Tract: Bilateral kidneys and bilateral adrenal glands are normal in appearance. No hydroureteronephrosis in the visualized portions of the abdomen. Stomach/Bowel: Visualized portions are unremarkable. Vascular/Lymphatic: No aneurysm identified in the visualized abdominal vasculature. No lymphadenopathy noted in the abdomen. Other: No significant volume of ascites noted in the visualized portions of the peritoneal cavity. Musculoskeletal: No aggressive appearing osseous lesions are noted in the visualized portions of the skeleton. IMPRESSION: 1. Choledocholithiasis with mild intra and extrahepatic biliary ductal dilatation concerning for biliary tract obstruction. 2. Persistent postoperative fluid collection in the gallbladder fossa in this patient status post cholecystectomy, concerning for potential biloma. This could be further evaluated with HIDA scan if clinically appropriate. Electronically Signed   By: Vinnie Langton M.D.   On: 04/30/2018 17:54       Assessment / Plan:     #1 37 yo morbidly obese AA female  S/p lap chole for acute cholecystitis 04/01/2018 readmitted with persistently elevated LFT's ,N/V and recurrent severe RUQ pain.  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  Unclear whether her pain is secondary to CBD stones or  Biloma , or both. #2 Sickle cell disease  #3 AODM  Plan; Pt needs ERCP with stone extraction - will schedule for Monday with Dr Loletha Carrow -Procedure  was discussed in detail with pt and her Mother this morning, including indications  ,risks and benefits and she is agreeable to proceed.  Continue IV ABX  Pt likely will also need the Biloma drained- will defer to surgery regarding timing Contact  Amy Esterwood, P.A.-C               [phone] Principal Problem:   Abdominal pain Active Problems:   HTN (hypertension), benign   Diabetes mellitus type 2 in obese ([place])   Acute cholecystitis s/p lap cholecystectomy 04/03/2018   Morbid obesity with body mass index (BMI) of 50.0 to 59.9 in adult [facility])   Transaminitis   Anemia   LOS: 3 days   Amy Esterwood  05/01/2018, 8:46 AM   GI ATTENDING  Interval history and data, including MRCP personally reviewed. Patient seen and examined. Agree with above comprehensive interval progress note as outlined. MRCP confirms choledocholithiasis. For ERCP tomorrow. Currently arranged with endoscopy and general anesthesia at 8:30 AM with Dr. Loletha Carrow. The patient is higher than baseline risk due to her body habitus. Continue antibiotics. General surgery has decided to drain the enlarging organized fluid collection in the gallbladder fossa. Appears this will be done later today.  Docia Chuck. Geri Seminole., M.D. [facility] Division of Gastroenterology

## 2018-05-02 ENCOUNTER — Encounter (HOSPITAL_COMMUNITY)
Admission: EM | Disposition: A | Discharge status: Home / Self Care | Payer: Self-pay | Source: Home / Self Care | Attending: Internal Medicine

## 2018-05-02 ENCOUNTER — Inpatient Hospital Stay (HOSPITAL_COMMUNITY): Payer: Medicare Other | Admitting: Anesthesiology

## 2018-05-02 ENCOUNTER — Inpatient Hospital Stay (HOSPITAL_COMMUNITY): Payer: Medicare Other

## 2018-05-02 DIAGNOSIS — K9189 Other postprocedural complications and disorders of digestive system: Secondary | ICD-10-CM

## 2018-05-02 DIAGNOSIS — K839 Disease of biliary tract, unspecified: Secondary | ICD-10-CM

## 2018-05-02 DIAGNOSIS — K838 Other specified diseases of biliary tract: Secondary | ICD-10-CM

## 2018-05-02 DIAGNOSIS — K8050 Calculus of bile duct without cholangitis or cholecystitis without obstruction: Secondary | ICD-10-CM

## 2018-05-02 DIAGNOSIS — Z9049 Acquired absence of other specified parts of digestive tract: Secondary | ICD-10-CM

## 2018-05-02 HISTORY — PX: BILIARY STENT PLACEMENT: SHX5538

## 2018-05-02 HISTORY — PX: ERCP: SHX5425

## 2018-05-02 HISTORY — PX: SPHINCTEROTOMY: SHX5279

## 2018-05-02 HISTORY — PX: REMOVAL OF STONES: SHX5545

## 2018-05-02 LAB — COMPREHENSIVE METABOLIC PANEL
ALT: 384 U/L — ABNORMAL HIGH (ref 14–54)
AST: 187 U/L — ABNORMAL HIGH (ref 15–41)
Albumin: 2.8 g/dL — ABNORMAL LOW (ref 3.5–5.0)
Alkaline Phosphatase: 362 U/L — ABNORMAL HIGH (ref 38–126)
Anion gap: 11 (ref 5–15)
BUN: 6 mg/dL (ref 6–20)
CO2: 22 mmol/L (ref 22–32)
Calcium: 8.4 mg/dL — ABNORMAL LOW (ref 8.9–10.3)
Chloride: 102 mmol/L (ref 101–111)
Creatinine, Ser: 0.66 mg/dL (ref 0.44–1.00)
GFR calc Af Amer: >60 mL/min (ref >60)
GFR calc non Af Amer: >60 mL/min (ref >60)
Glucose, Bld: 134 mg/dL — ABNORMAL HIGH (ref 65–99)
Potassium: 3.5 mmol/L (ref 3.5–5.1)
Sodium: 135 mmol/L (ref 135–145)
Total Bilirubin: 1.2 mg/dL (ref 0.3–1.2)
Total Protein: 7.1 g/dL (ref 6.5–8.1)

## 2018-05-02 LAB — GLUCOSE, CAPILLARY
Glucose-Capillary: 121 mg/dL — ABNORMAL HIGH (ref 65–99)
Glucose-Capillary: 145 mg/dL — ABNORMAL HIGH (ref 65–99)
Glucose-Capillary: 175 mg/dL — ABNORMAL HIGH (ref 65–99)
Glucose-Capillary: 177 mg/dL — ABNORMAL HIGH (ref 65–99)
Glucose-Capillary: 199 mg/dL — ABNORMAL HIGH (ref 65–99)
Glucose-Capillary: 209 mg/dL — ABNORMAL HIGH (ref 65–99)
Glucose-Capillary: 221 mg/dL — ABNORMAL HIGH (ref 65–99)
Glucose-Capillary: 251 mg/dL — ABNORMAL HIGH (ref 65–99)

## 2018-05-02 LAB — CBC WITH DIFFERENTIAL/PLATELET
Basophils Absolute: 0 10*3/uL (ref 0.0–0.1)
Basophils Relative: 0 %
Eosinophils Absolute: 0.2 10*3/uL (ref 0.0–0.7)
Eosinophils Relative: 2 %
HCT: 29.7 % — ABNORMAL LOW (ref 36.0–46.0)
Hemoglobin: 9.4 g/dL — ABNORMAL LOW (ref 12.0–15.0)
Lymphocytes Relative: 34 %
Lymphs Abs: 3.2 10*3/uL (ref 0.7–4.0)
MCH: 21.7 pg — ABNORMAL LOW (ref 26.0–34.0)
MCHC: 31.6 g/dL (ref 30.0–36.0)
MCV: 68.6 fL — ABNORMAL LOW (ref 78.0–100.0)
Monocytes Absolute: 0.5 10*3/uL (ref 0.1–1.0)
Monocytes Relative: 5 %
Neutro Abs: 5.4 10*3/uL (ref 1.7–7.7)
Neutrophils Relative %: 59 %
Platelets: 315 10*3/uL (ref 150–400)
RBC: 4.33 MIL/uL (ref 3.87–5.11)
RDW: 18.7 % — ABNORMAL HIGH (ref 11.5–15.5)
WBC: 9.3 10*3/uL (ref 4.0–10.5)

## 2018-05-02 SURGERY — ERCP, WITH INTERVENTION IF INDICATED
Anesthesia: General

## 2018-05-02 MED ORDER — SCOPOLAMINE 1 MG/3DAYS TD PT72
MEDICATED_PATCH | TRANSDERMAL | Status: AC
  Filled 2018-05-02: qty 1

## 2018-05-02 MED ORDER — PROMETHAZINE HCL 25 MG/ML IJ SOLN
INTRAMUSCULAR | Status: AC
  Filled 2018-05-02: qty 1

## 2018-05-02 MED ORDER — GLUCAGON HCL RDNA (DIAGNOSTIC) 1 MG IJ SOLR
INTRAMUSCULAR | Status: DC | PRN
  Administered 2018-05-02 (×3): .5 mg via INTRAVENOUS

## 2018-05-02 MED ORDER — LACTATED RINGERS IV SOLN
INTRAVENOUS | Status: AC
  Administered 2018-05-02: 1000 mL via INTRAVENOUS

## 2018-05-02 MED ORDER — SCOPOLAMINE 1 MG/3DAYS TD PT72
MEDICATED_PATCH | TRANSDERMAL | Status: DC | PRN
  Administered 2018-05-02: 1.5 mg via TRANSDERMAL

## 2018-05-02 MED ORDER — DIPHENHYDRAMINE HCL 50 MG/ML IJ SOLN
INTRAMUSCULAR | Status: AC
  Filled 2018-05-02: qty 1

## 2018-05-02 MED ORDER — MEPERIDINE HCL 25 MG/ML IJ SOLN: 6.2500 mg | INTRAMUSCULAR | Status: DC | PRN

## 2018-05-02 MED ORDER — MIDAZOLAM HCL 2 MG/2ML IJ SOLN
INTRAMUSCULAR | Status: AC
  Filled 2018-05-02: qty 2

## 2018-05-02 MED ORDER — MIDAZOLAM HCL 5 MG/5ML IJ SOLN
INTRAMUSCULAR | Status: DC | PRN
  Administered 2018-05-02: 2 mg via INTRAVENOUS

## 2018-05-02 MED ORDER — SODIUM CHLORIDE 0.9 % IV SOLN
INTRAVENOUS | Status: DC | PRN
  Administered 2018-05-02: 40 mL

## 2018-05-02 MED ORDER — FENTANYL CITRATE (PF) 100 MCG/2ML IJ SOLN
INTRAMUSCULAR | Status: AC
  Filled 2018-05-02: qty 2

## 2018-05-02 MED ORDER — PROMETHAZINE HCL 25 MG/ML IJ SOLN
6.2500 mg | INTRAMUSCULAR | Status: DC | PRN
  Administered 2018-05-02: 6.25 mg via INTRAVENOUS

## 2018-05-02 MED ORDER — FENTANYL CITRATE (PF) 100 MCG/2ML IJ SOLN: 25.0000 ug | INTRAMUSCULAR | Status: DC | PRN

## 2018-05-02 MED ORDER — PROMETHAZINE HCL 25 MG/ML IJ SOLN
6.2500 mg | Freq: Once | INTRAMUSCULAR | Status: AC
  Administered 2018-05-02: 6.25 mg via INTRAVENOUS

## 2018-05-02 MED ORDER — MIDAZOLAM HCL 2 MG/2ML IJ SOLN: 0.5000 mg | Freq: Once | INTRAMUSCULAR | Status: DC | PRN

## 2018-05-02 MED ORDER — GLUCAGON HCL RDNA (DIAGNOSTIC) 1 MG IJ SOLR
INTRAMUSCULAR | Status: AC
  Filled 2018-05-02: qty 1

## 2018-05-02 MED ORDER — ONDANSETRON HCL 4 MG/2ML IJ SOLN
INTRAMUSCULAR | Status: DC | PRN
  Administered 2018-05-02: 4 mg via INTRAVENOUS

## 2018-05-02 MED ORDER — DEXAMETHASONE SODIUM PHOSPHATE 10 MG/ML IJ SOLN
INTRAMUSCULAR | Status: DC | PRN
  Administered 2018-05-02: 5 mg via INTRAVENOUS

## 2018-05-02 MED ORDER — CIPROFLOXACIN IN D5W 400 MG/200ML IV SOLN
INTRAVENOUS | Status: AC
  Filled 2018-05-02: qty 200

## 2018-05-02 MED ORDER — DIPHENHYDRAMINE HCL 50 MG/ML IJ SOLN
12.5000 mg | Freq: Once | INTRAMUSCULAR | Status: AC
  Administered 2018-05-02: 12.5 mg via INTRAVENOUS

## 2018-05-02 MED ORDER — LACTATED RINGERS IV SOLN
INTRAVENOUS | Status: DC | PRN
  Administered 2018-05-02: 09:00:00 via INTRAVENOUS

## 2018-05-02 MED ORDER — INDOMETHACIN 50 MG RE SUPP
RECTAL | Status: DC | PRN
  Administered 2018-05-02 (×2): 50 mg via RECTAL

## 2018-05-02 MED ORDER — PROPOFOL 10 MG/ML IV BOLUS
INTRAVENOUS | Status: AC
  Filled 2018-05-02: qty 20

## 2018-05-02 MED ORDER — FENTANYL CITRATE (PF) 250 MCG/5ML IJ SOLN
INTRAMUSCULAR | Status: DC | PRN
  Administered 2018-05-02: 100 ug via INTRAVENOUS
  Administered 2018-05-02 (×2): 50 ug via INTRAVENOUS

## 2018-05-02 MED ORDER — SUGAMMADEX SODIUM 500 MG/5ML IV SOLN
INTRAVENOUS | Status: DC | PRN
  Administered 2018-05-02: 300 mg via INTRAVENOUS

## 2018-05-02 MED ORDER — LIDOCAINE 2% (20 MG/ML) 5 ML SYRINGE
INTRAMUSCULAR | Status: DC | PRN
  Administered 2018-05-02: 75 mg via INTRAVENOUS

## 2018-05-02 MED ORDER — PROPOFOL 10 MG/ML IV BOLUS
INTRAVENOUS | Status: DC | PRN
  Administered 2018-05-02: 150 mg via INTRAVENOUS

## 2018-05-02 MED ORDER — ROCURONIUM BROMIDE 10 MG/ML (PF) SYRINGE
PREFILLED_SYRINGE | INTRAVENOUS | Status: DC | PRN
  Administered 2018-05-02: 10 mg via INTRAVENOUS
  Administered 2018-05-02: 60 mg via INTRAVENOUS
  Administered 2018-05-02: 10 mg via INTRAVENOUS

## 2018-05-02 MED ORDER — CIPROFLOXACIN IN D5W 400 MG/200ML IV SOLN
400.0000 mg | Freq: Once | INTRAVENOUS | Status: AC
  Administered 2018-05-02: 400 mg via INTRAVENOUS

## 2018-05-02 MED ORDER — INDOMETHACIN 50 MG RE SUPP
RECTAL | Status: AC
  Filled 2018-05-02: qty 2

## 2018-05-02 MED ORDER — PHENYLEPHRINE 40 MCG/ML (10ML) SYRINGE FOR IV PUSH (FOR BLOOD PRESSURE SUPPORT)
PREFILLED_SYRINGE | INTRAVENOUS | Status: DC | PRN
  Administered 2018-05-02: 80 ug via INTRAVENOUS

## 2018-05-02 NOTE — Progress Notes (Signed)
[facility] Progress Note  Day of Surgery  Subjective: CC:  C/o back pain. States her nausea is improved compared to in PACU where she had multiple episodes of emesis.  Objective: Vital signs in last 24 hours: Temp:  [97.9 F (36.6 C)-98.5 F (36.9 C)] 98.1 F (36.7 C) (06/03 1320) Pulse Rate:  [80-123] 84 (06/03 1320) Resp:  [12-24] 24 (06/03 1210) BP: (126-189)/(71-137) 160/93 (06/03 1320) SpO2:  [95 %-100 %] 100 % (06/03 1320) Last BM Date: 04/27/18  Intake/Output from previous day: 06/02 0701 - 06/03 0700 In: 10  Out: 1826 [Urine:550; Emesis/NG output:1001; Drains:275] Intake/Output this shift: Total I/O In: 915 [I.V.:715; IV Piggyback:200] Out: 10 [Blood:10]  PE: Gen:  Alert, NAD, resting comfortably in bed. Pulm:  Normal effort Abd: Soft, obese, RUQ drain with bilious fluid in bag - 275 cc/24h Skin: warm and dry, no rashes  Psych: A&Ox3   Lab Results:  Recent Labs    05/01/18 0530 05/02/18 0558  WBC 8.7 9.3  HGB 9.9* 9.4*  HCT 31.1* 29.7*  PLT 368 315   BMET Recent Labs    05/01/18 0530 05/02/18 0558  NA 136 135  K 3.7 3.5  CL 104 102  CO2 22 22  GLUCOSE 183* 134*  BUN 6 6  CREATININE 0.70 0.66  CALCIUM 8.6* 8.4*   PT/INR No results for input(s): LABPROT, INR in the last 72 hours. CMP     Component Value Date/Time   NA 135 05/02/2018 0558   NA 140 11/19/2014 0933   K 3.5 05/02/2018 0558   CL 102 05/02/2018 0558   CO2 22 05/02/2018 0558   GLUCOSE 134 (H) 05/02/2018 0558   BUN 6 05/02/2018 0558   BUN 10 11/19/2014 0933   CREATININE 0.66 05/02/2018 0558   CALCIUM 8.4 (L) 05/02/2018 0558   PROT 7.1 05/02/2018 0558   PROT 6.7 11/19/2014 0933   ALBUMIN 2.8 (L) 05/02/2018 0558   ALBUMIN 3.3 (L) 11/19/2014 0933   AST 187 (H) 05/02/2018 0558   ALT 384 (H) 05/02/2018 0558   ALKPHOS 362 (H) 05/02/2018 0558   BILITOT 1.2 05/02/2018 0558   GFRNONAA >60 05/02/2018 0558   GFRAA >60 05/02/2018 0558   Lipase     Component Value  Date/Time   LIPASE 41 05/01/2018 0530       Studies/Results: Mr 3d Recon At Scanner  Result Date: 04/30/2018 CLINICAL DATA:  37 year old female with history of abnormal liver function tests. Cholelithiasis. EXAM: MRI ABDOMEN WITHOUT AND WITH CONTRAST (INCLUDING MRCP) TECHNIQUE: Multiplanar multisequence MR imaging of the abdomen was performed both before and after the administration of intravenous contrast. Heavily T2-weighted images of the biliary and pancreatic ducts were obtained, and three-dimensional MRCP images were rendered by post processing. CONTRAST:  42mL MULTIHANCE GADOBENATE DIMEGLUMINE 529 MG/ML IV SOLN COMPARISON:  No prior abdominal MRI. CT the abdomen and pelvis 04/28/2018. FINDINGS: Comment: Portions of the examination are limited by considerable patient respiratory motion. Lower chest: Unremarkable. Hepatobiliary: Status post cholecystectomy. Complex postoperative fluid collection in the gallbladder fossa measuring approximately 4.5 x 5.4 x 5.2 cm (axial image 21 of series 19 and coronal image 29 of series 11) which demonstrates heterogeneous T1 and T2 signal intensity but is generally T1 hypointense and T2 hyperintense with internal areas of slightly increased T1 hypointensity and decreased T2 intensity. This collection does not demonstrate internal enhancement, and is most compatible with a biloma. No other suspicious hepatic lesions are noted. There multiple filling defects within the common bile duct,  compatible with choledocholithiasis. Common bile duct is dilated measuring up to 10 mm in the porta hepatis. There is also some mild intrahepatic biliary ductal dilatation. Pancreas: No pancreatic mass. No pancreatic ductal dilatation noted on MRCP images. No pancreatic or peripancreatic fluid or inflammatory changes. Spleen:  Unremarkable. Adrenals/Urinary Tract: Bilateral kidneys and bilateral adrenal glands are normal in appearance. No hydroureteronephrosis in the visualized portions  of the abdomen. Stomach/Bowel: Visualized portions are unremarkable. Vascular/Lymphatic: No aneurysm identified in the visualized abdominal vasculature. No lymphadenopathy noted in the abdomen. Other: No significant volume of ascites noted in the visualized portions of the peritoneal cavity. Musculoskeletal: No aggressive appearing osseous lesions are noted in the visualized portions of the skeleton. IMPRESSION: 1. Choledocholithiasis with mild intra and extrahepatic biliary ductal dilatation concerning for biliary tract obstruction. 2. Persistent postoperative fluid collection in the gallbladder fossa in this patient status post cholecystectomy, concerning for potential biloma. This could be further evaluated with HIDA scan if clinically appropriate. Electronically Signed   By: Vinnie Langton M.D.   On: 04/30/2018 17:54   Dg Ercp With Sphincterotomy  Result Date: 05/02/2018 CLINICAL DATA:  37 year old female with choledocholithiasis EXAM: ERCP TECHNIQUE: Multiple spot images obtained with the fluoroscopic device and submitted for interpretation post-procedure. FLUOROSCOPY TIME:  Fluoroscopy Time:  12 minutes 22 seconds reported Radiation Exposure Index (if provided by the fluoroscopic device): 230 mGy COMPARISON:  MRCP 04/30/2018 FINDINGS: A total of 22 saved images were obtained and submitted for review. The images demonstrate a flexible endoscope in the descending duodenum with wire cannulation of the hepatic ducts followed by cholangiogram. There is moderate intra and extrahepatic biliary ductal dilatation. Multiple filling defects in the common bile duct are consistent with choledocholithiasis. A percutaneous drainage catheter is present in the right upper quadrant. There is evidence of extravasation of contrast material in the region of the surgical clips, likely at the cystic duct remanent. Subsequent images demonstrate sphincterotomy, balloon sweep of the common duct and ultimately placement of a  plastic biliary stent. IMPRESSION: 1. Choledocholithiasis with moderate intra and extrahepatic biliary ductal dilatation. 2. Extravasation of injected contrast material in the gallbladder fossa, likely from the cystic duct remnant. 3. Sphincterotomy, balloon sweep of the common duct and placement of a plastic biliary stent. These images were submitted for radiologic interpretation only. Please see the procedural report for the amount of contrast and the fluoroscopy time utilized. Electronically Signed   By: Jacqulynn Cadet M.D.   On: 05/02/2018 11:05   Mr Abdomen Mrcp Moise Boring Contast  Result Date: 04/30/2018 CLINICAL DATA:  37 year old female with history of abnormal liver function tests. Cholelithiasis. EXAM: MRI ABDOMEN WITHOUT AND WITH CONTRAST (INCLUDING MRCP) TECHNIQUE: Multiplanar multisequence MR imaging of the abdomen was performed both before and after the administration of intravenous contrast. Heavily T2-weighted images of the biliary and pancreatic ducts were obtained, and three-dimensional MRCP images were rendered by post processing. CONTRAST:  44mL MULTIHANCE GADOBENATE DIMEGLUMINE 529 MG/ML IV SOLN COMPARISON:  No prior abdominal MRI. CT the abdomen and pelvis 04/28/2018. FINDINGS: Comment: Portions of the examination are limited by considerable patient respiratory motion. Lower chest: Unremarkable. Hepatobiliary: Status post cholecystectomy. Complex postoperative fluid collection in the gallbladder fossa measuring approximately 4.5 x 5.4 x 5.2 cm (axial image 21 of series 19 and coronal image 29 of series 11) which demonstrates heterogeneous T1 and T2 signal intensity but is generally T1 hypointense and T2 hyperintense with internal areas of slightly increased T1 hypointensity and decreased T2 intensity. This collection does not  demonstrate internal enhancement, and is most compatible with a biloma. No other suspicious hepatic lesions are noted. There multiple filling defects within the common  bile duct, compatible with choledocholithiasis. Common bile duct is dilated measuring up to 10 mm in the porta hepatis. There is also some mild intrahepatic biliary ductal dilatation. Pancreas: No pancreatic mass. No pancreatic ductal dilatation noted on MRCP images. No pancreatic or peripancreatic fluid or inflammatory changes. Spleen:  Unremarkable. Adrenals/Urinary Tract: Bilateral kidneys and bilateral adrenal glands are normal in appearance. No hydroureteronephrosis in the visualized portions of the abdomen. Stomach/Bowel: Visualized portions are unremarkable. Vascular/Lymphatic: No aneurysm identified in the visualized abdominal vasculature. No lymphadenopathy noted in the abdomen. Other: No significant volume of ascites noted in the visualized portions of the peritoneal cavity. Musculoskeletal: No aggressive appearing osseous lesions are noted in the visualized portions of the skeleton. IMPRESSION: 1. Choledocholithiasis with mild intra and extrahepatic biliary ductal dilatation concerning for biliary tract obstruction. 2. Persistent postoperative fluid collection in the gallbladder fossa in this patient status post cholecystectomy, concerning for potential biloma. This could be further evaluated with HIDA scan if clinically appropriate. Electronically Signed   By: Vinnie Langton M.D.   On: 04/30/2018 17:54   Ct Image Guided Drainage By Percutaneous Catheter  Result Date: 05/01/2018 INDICATION: Recent cholecystectomy, now with persistent fluid collection within the gallbladder fossa Please perform image guided percutaneous drainage catheter placement for infection source control purposes EXAM: ULTRASOUND AND CT GUIDED PERCUTANEOUS DRAINAGE CATHETER PLACEMENT INTO THE GALLBLADDER FOSSA COMPARISON:  CT abdomen and pelvis -0 04/28/2018; MRCP - 04/30/2018; nuclear medicine HIDA scan - 04/28/2018 MEDICATIONS: The patient is currently admitted to the hospital and receiving intravenous antibiotics. The antibiotics  were administered within an appropriate time frame prior to the initiation of the procedure. ANESTHESIA/SEDATION: Moderate (conscious) sedation was employed during this procedure. A total of Versed 4 mg and Fentanyl 200 mcg was administered intravenously. Moderate Sedation Time: 20 minutes. The patient's level of consciousness and vital signs were monitored continuously by radiology nursing throughout the procedure under my direct supervision. CONTRAST:  None COMPLICATIONS: None immediate. PROCEDURE: Informed written consent was obtained from the patient after a discussion of the risks, benefits and alternatives to treatment. The patient was placed supine on the CT gantry and a pre procedural CT was performed re-demonstrating the known abscess/fluid collection within the gallbladder fossa with dominant component measuring approximately 6.0 x 4.9 cm (image 20, series 2). Sonographic evaluation was performed of the right upper abdominal quadrant adequately demonstrating the location of the gallbladder fossa fluid collection. The procedure was planned. A timeout was performed prior to the initiation of the procedure. The skin overlying the right upper abdomen was prepped and draped in the usual sterile fashion. The overlying soft tissues were anesthetized with 1% lidocaine with epinephrine. Under direct ultrasound guidance, an 18 gauge trocar needle was advanced into the abscess/fluid collection and a short Amplatz super stiff wire was coiled within the collection. Appropriate positioning was confirmed with a limited CT scan. The tract was serially dilated allowing placement of a 10 [place] all-purpose drainage catheter. Appropriate positioning was confirmed with a limited postprocedural CT scan. Approximately 60 cc of bilious appearing non foul smelling fluid was aspirated. The tube was connected to a drainage bag and sutured in place. A dressing was placed. The patient tolerated the procedure well without immediate  post procedural complication. IMPRESSION: Successful ultrasound and CT guided placement of a 10 French all purpose drain catheter into the gallbladder fossa fluid collection  with aspiration of 60 cc of bilious appearing, non foul smelling fluid. Samples were sent to the laboratory (including for the acquisition of bilirubin levels) as requested by the ordering clinical team. Electronically Signed   By: Sandi Mariscal M.D.   On: 05/01/2018 15:34    Anti-infectives: Anti-infectives (From admission, onward)   Start     Dose/Rate Route Frequency Ordered Stop   05/02/18 0830  ciprofloxacin (CIPRO) IVPB 400 mg     400 mg 200 mL/hr over 60 Minutes Intravenous  Once 05/02/18 0827 05/02/18 0959   04/28/18 1500  cefTRIAXone (ROCEPHIN) 2 g in sodium chloride 0.9 % 100 mL IVPB     2 g 200 mL/hr over 30 Minutes Intravenous Every 24 hours 04/28/18 1055     04/28/18 0930  aztreonam (AZACTAM) 2 g in sodium chloride 0.9 % 100 mL IVPB     2 g 200 mL/hr over 30 Minutes Intravenous  Once 04/28/18 0929 04/28/18 1100     Assessment/Plan Abdominal pain Post-operative bile leak S/p subtotal cholecystectomy 5/3 -  Cholangiogram was unable to be performed during surgery. A partial cholecystectomy was performed to avoid injury to the CBD.  -   5/30 CT scan showed GB fossa fluid collection - appears to be simple fluid, suspect biloma. -   5/31  HIDA not able to definitively r/o post-op leak.  -   6/1  MRCP confirms choledocholithiasis  -   6/2 percutaneous drainage of biloma -   6/3 ERCP significant for CBD stones and stones in GB remnant, bile leak from GB remnant; biliary sphincterectomy and placement of stents in ventral pancreatic duct and CBD by Dr. Pincus Sanes.  -  Follow LFTs/lipase, hopefully will not develop pancreatitis s/p ERCP -  Monitory RUQ drain output -  Mobilize TID and OOB to chair  FEN: clear liquid diet  ID: Rocephin 5/30 >> VTE: SCD's Foley: [place] cath, this should be d/c-ed if possible   Follow up: Dr. Donne Hazel      LOS: 4 days    Jill Alexanders , [facility] 05/02/2018, 1:42 PM Pager: [phone] Consults: [phone] Mon-Fri 7:00 am-4:30 pm Sat-Sun 7:00 am-11:30 am

## 2018-05-02 NOTE — Progress Notes (Signed)
PONV. Bile and blood.  Anesthesia notified.  6.25 phenergan.  Patient feeling better but still nauseas. Will continue to monitor.

## 2018-05-02 NOTE — Anesthesia Postprocedure Evaluation (Signed)
Anesthesia Post Note  Patient: Cynthia Rosales  Procedure(s) Performed: ENDOSCOPIC RETROGRADE CHOLANGIOPANCREATOGRAPHY (ERCP) (N/A ) SPHINCTEROTOMY REMOVAL OF STONES BILIARY STENT PLACEMENT (N/A )     Patient location during evaluation: Endoscopy Anesthesia Type: General Level of consciousness: sedated, oriented and patient cooperative Pain management: pain level controlled Vital Signs Assessment: post-procedure vital signs reviewed and stable Respiratory status: spontaneous breathing, nonlabored ventilation and respiratory function stable Cardiovascular status: blood pressure returned to baseline and stable : nausea and vomiting improved. Anesthetic complications: no    Last Vitals:  Vitals:   05/02/18 1200 05/02/18 1210  BP: (!) 162/137 (!) 166/101  Pulse: 92 85  Resp: (!) 24 (!) 24  Temp:    SpO2: 100% 100%    Last Pain:  Vitals:   05/02/18 1150  TempSrc:   PainSc: 0-No pain                 Morrissa Shein,E. Shrika Milos

## 2018-05-02 NOTE — Progress Notes (Signed)
Referring Physician(s): Gross,S  Supervising Physician: Jacqulynn Cadet  Patient Status:  [facility] - In-pt  Chief Complaint:  Abdominal pain, bile leak  Subjective: Pt s/p ERCP earlier today with stone retrieval, plastic stenting of ventral pancreatic/CBD; nausea resolved; has some back discomfort   Allergies: Amoxicillin; Buprenorphine hcl; and Morphine and related  Medications: Prior to Admission medications   Medication Sig Start Date End Date Taking? Authorizing Provider  AMITIZA 24 MCG capsule Take 24 mcg by mouth daily with breakfast.  04/27/18  Yes [provider]  hydrochlorothiazide (HYDRODIURIL) 25 MG tablet Take 25 mg by mouth daily.  04/27/18  Yes [provider]  insulin NPH-regular Human (NOVOLIN 70/30) (70-30) 100 UNIT/ML injection Inject 35-36 Units into the skin See admin instructions. Inject 35 units subcutaneously after breakfast and 36 units after supper   Yes [provider]  metFORMIN (GLUCOPHAGE) 1000 MG tablet Take 1,000 mg by mouth daily with breakfast.  04/27/18  Yes [provider]  ondansetron (ZOFRAN ODT) 4 MG disintegrating tablet Take 1 tablet (4 mg total) by mouth every 8 (eight) hours as needed for nausea or vomiting. 04/19/18  Yes Lorin Glass, PA-C  oxycodone (ROXICODONE) 30 MG immediate release tablet Take 30 mg by mouth every 6 (six) hours as needed for pain.   Yes [provider]  sitaGLIPtin (JANUVIA) 100 MG tablet Take 100 mg by mouth daily.   Yes [provider]  albuterol (PROVENTIL HFA;VENTOLIN HFA) 108 (90 Base) MCG/ACT inhaler Inhale 2 puffs into the lungs every 6 (six) hours as needed for wheezing or shortness of breath.    [provider]  diazepam (VALIUM) 5 MG tablet take 1 tablet BY MOUTH THREE TIMES DAILY AS NEEDED Patient taking differently: Take 5 mg by mouth every 8 (eight) hours as needed for anxiety (pain). take 1 tablet BY MOUTH THREE TIMES DAILY AS NEEDED  04/27/18   Melvenia Beam, MD  hydrochlorothiazide (HYDRODIURIL) 12.5 MG tablet Take 1 tablet (12.5 mg total) by mouth daily. Patient not taking: Reported on 04/27/2018 04/06/18   Roxan Hockey, MD  Prenat w/o A Vit-FeFum-FePo-FA (CONCEPT OB) 130-92.4-1 MG CAPS Take 1 capsule by mouth daily. Patient not taking: Reported on 11/15/2017 08/09/17   [name], MD     Vital Signs: BP (!) 160/93 (BP Location: Right Arm)   Pulse 84   Temp 98.1 F (36.7 C) (Other (Comment))   Resp (!) 24   Ht 5\' 4"  (1.626 m)   Wt (!) 310 lb (140.6 kg)   LMP 05/02/2018 Comment: negative beta CHG 04/27/17  SpO2 100%   BMI 53.21 kg/m   Physical Exam drowsy but arousable; RUQ drain intact, insertion site ok, mildly tender, output 275+ cc green bile  Imaging: Mr 3d Recon At Scanner  Result Date: 04/30/2018 CLINICAL DATA:  37 year old female with history of abnormal liver function tests. Cholelithiasis. EXAM: MRI ABDOMEN WITHOUT AND WITH CONTRAST (INCLUDING MRCP) TECHNIQUE: Multiplanar multisequence MR imaging of the abdomen was performed both before and after the administration of intravenous contrast. Heavily T2-weighted images of the biliary and pancreatic ducts were obtained, and three-dimensional MRCP images were rendered by post processing. CONTRAST:  12mL MULTIHANCE GADOBENATE DIMEGLUMINE 529 MG/ML IV SOLN COMPARISON:  No prior abdominal MRI. CT the abdomen and pelvis 04/28/2018. FINDINGS: Comment: Portions of the examination are limited by considerable patient respiratory motion. Lower chest: Unremarkable. Hepatobiliary: Status post cholecystectomy. Complex postoperative fluid collection in the gallbladder fossa measuring approximately 4.5 x 5.4 x 5.2  cm (axial image 21 of series 19 and coronal image 29 of series 11) which demonstrates heterogeneous T1 and T2 signal intensity but is generally T1 hypointense and T2 hyperintense with internal areas of slightly increased T1 hypointensity and decreased T2  intensity. This collection does not demonstrate internal enhancement, and is most compatible with a biloma. No other suspicious hepatic lesions are noted. There multiple filling defects within the common bile duct, compatible with choledocholithiasis. Common bile duct is dilated measuring up to 10 mm in the porta hepatis. There is also some mild intrahepatic biliary ductal dilatation. Pancreas: No pancreatic mass. No pancreatic ductal dilatation noted on MRCP images. No pancreatic or peripancreatic fluid or inflammatory changes. Spleen:  Unremarkable. Adrenals/Urinary Tract: Bilateral kidneys and bilateral adrenal glands are normal in appearance. No hydroureteronephrosis in the visualized portions of the abdomen. Stomach/Bowel: Visualized portions are unremarkable. Vascular/Lymphatic: No aneurysm identified in the visualized abdominal vasculature. No lymphadenopathy noted in the abdomen. Other: No significant volume of ascites noted in the visualized portions of the peritoneal cavity. Musculoskeletal: No aggressive appearing osseous lesions are noted in the visualized portions of the skeleton. IMPRESSION: 1. Choledocholithiasis with mild intra and extrahepatic biliary ductal dilatation concerning for biliary tract obstruction. 2. Persistent postoperative fluid collection in the gallbladder fossa in this patient status post cholecystectomy, concerning for potential biloma. This could be further evaluated with HIDA scan if clinically appropriate. Electronically Signed   By: Vinnie Langton M.D.   On: 04/30/2018 17:54   Dg Ercp With Sphincterotomy  Result Date: 05/02/2018 CLINICAL DATA:  37 year old female with choledocholithiasis EXAM: ERCP TECHNIQUE: Multiple spot images obtained with the fluoroscopic device and submitted for interpretation post-procedure. FLUOROSCOPY TIME:  Fluoroscopy Time:  12 minutes 22 seconds reported Radiation Exposure Index (if provided by the fluoroscopic device): 230 mGy COMPARISON:   MRCP 04/30/2018 FINDINGS: A total of 22 saved images were obtained and submitted for review. The images demonstrate a flexible endoscope in the descending duodenum with wire cannulation of the hepatic ducts followed by cholangiogram. There is moderate intra and extrahepatic biliary ductal dilatation. Multiple filling defects in the common bile duct are consistent with choledocholithiasis. A percutaneous drainage catheter is present in the right upper quadrant. There is evidence of extravasation of contrast material in the region of the surgical clips, likely at the cystic duct remanent. Subsequent images demonstrate sphincterotomy, balloon sweep of the common duct and ultimately placement of a plastic biliary stent. IMPRESSION: 1. Choledocholithiasis with moderate intra and extrahepatic biliary ductal dilatation. 2. Extravasation of injected contrast material in the gallbladder fossa, likely from the cystic duct remnant. 3. Sphincterotomy, balloon sweep of the common duct and placement of a plastic biliary stent. These images were submitted for radiologic interpretation only. Please see the procedural report for the amount of contrast and the fluoroscopy time utilized. Electronically Signed   By: Jacqulynn Cadet M.D.   On: 05/02/2018 11:05   Mr Abdomen Mrcp Moise Boring Contast  Result Date: 04/30/2018 CLINICAL DATA:  37 year old female with history of abnormal liver function tests. Cholelithiasis. EXAM: MRI ABDOMEN WITHOUT AND WITH CONTRAST (INCLUDING MRCP) TECHNIQUE: Multiplanar multisequence MR imaging of the abdomen was performed both before and after the administration of intravenous contrast. Heavily T2-weighted images of the biliary and pancreatic ducts were obtained, and three-dimensional MRCP images were rendered by post processing. CONTRAST:  34mL MULTIHANCE GADOBENATE DIMEGLUMINE 529 MG/ML IV SOLN COMPARISON:  No prior abdominal MRI. CT the abdomen and pelvis 04/28/2018. FINDINGS: Comment: Portions of the  examination are limited  by considerable patient respiratory motion. Lower chest: Unremarkable. Hepatobiliary: Status post cholecystectomy. Complex postoperative fluid collection in the gallbladder fossa measuring approximately 4.5 x 5.4 x 5.2 cm (axial image 21 of series 19 and coronal image 29 of series 11) which demonstrates heterogeneous T1 and T2 signal intensity but is generally T1 hypointense and T2 hyperintense with internal areas of slightly increased T1 hypointensity and decreased T2 intensity. This collection does not demonstrate internal enhancement, and is most compatible with a biloma. No other suspicious hepatic lesions are noted. There multiple filling defects within the common bile duct, compatible with choledocholithiasis. Common bile duct is dilated measuring up to 10 mm in the porta hepatis. There is also some mild intrahepatic biliary ductal dilatation. Pancreas: No pancreatic mass. No pancreatic ductal dilatation noted on MRCP images. No pancreatic or peripancreatic fluid or inflammatory changes. Spleen:  Unremarkable. Adrenals/Urinary Tract: Bilateral kidneys and bilateral adrenal glands are normal in appearance. No hydroureteronephrosis in the visualized portions of the abdomen. Stomach/Bowel: Visualized portions are unremarkable. Vascular/Lymphatic: No aneurysm identified in the visualized abdominal vasculature. No lymphadenopathy noted in the abdomen. Other: No significant volume of ascites noted in the visualized portions of the peritoneal cavity. Musculoskeletal: No aggressive appearing osseous lesions are noted in the visualized portions of the skeleton. IMPRESSION: 1. Choledocholithiasis with mild intra and extrahepatic biliary ductal dilatation concerning for biliary tract obstruction. 2. Persistent postoperative fluid collection in the gallbladder fossa in this patient status post cholecystectomy, concerning for potential biloma. This could be further evaluated with HIDA scan if  clinically appropriate. Electronically Signed   By: Vinnie Langton M.D.   On: 04/30/2018 17:54   Ct Image Guided Drainage By Percutaneous Catheter  Result Date: 05/01/2018 INDICATION: Recent cholecystectomy, now with persistent fluid collection within the gallbladder fossa Please perform image guided percutaneous drainage catheter placement for infection source control purposes EXAM: ULTRASOUND AND CT GUIDED PERCUTANEOUS DRAINAGE CATHETER PLACEMENT INTO THE GALLBLADDER FOSSA COMPARISON:  CT abdomen and pelvis -0 04/28/2018; MRCP - 04/30/2018; nuclear medicine HIDA scan - 04/28/2018 MEDICATIONS: The patient is currently admitted to the hospital and receiving intravenous antibiotics. The antibiotics were administered within an appropriate time frame prior to the initiation of the procedure. ANESTHESIA/SEDATION: Moderate (conscious) sedation was employed during this procedure. A total of Versed 4 mg and Fentanyl 200 mcg was administered intravenously. Moderate Sedation Time: 20 minutes. The patient's level of consciousness and vital signs were monitored continuously by radiology nursing throughout the procedure under my direct supervision. CONTRAST:  None COMPLICATIONS: None immediate. PROCEDURE: Informed written consent was obtained from the patient after a discussion of the risks, benefits and alternatives to treatment. The patient was placed supine on the CT gantry and a pre procedural CT was performed re-demonstrating the known abscess/fluid collection within the gallbladder fossa with dominant component measuring approximately 6.0 x 4.9 cm (image 20, series 2). Sonographic evaluation was performed of the right upper abdominal quadrant adequately demonstrating the location of the gallbladder fossa fluid collection. The procedure was planned. A timeout was performed prior to the initiation of the procedure. The skin overlying the right upper abdomen was prepped and draped in the usual sterile fashion. The  overlying soft tissues were anesthetized with 1% lidocaine with epinephrine. Under direct ultrasound guidance, an 18 gauge trocar needle was advanced into the abscess/fluid collection and a short Amplatz super stiff wire was coiled within the collection. Appropriate positioning was confirmed with a limited CT scan. The tract was serially dilated allowing placement of a 10 [place] all-purpose  drainage catheter. Appropriate positioning was confirmed with a limited postprocedural CT scan. Approximately 60 cc of bilious appearing non foul smelling fluid was aspirated. The tube was connected to a drainage bag and sutured in place. A dressing was placed. The patient tolerated the procedure well without immediate post procedural complication. IMPRESSION: Successful ultrasound and CT guided placement of a 10 French all purpose drain catheter into the gallbladder fossa fluid collection with aspiration of 60 cc of bilious appearing, non foul smelling fluid. Samples were sent to the laboratory (including for the acquisition of bilirubin levels) as requested by the ordering clinical team. Electronically Signed   By: Sandi Mariscal M.D.   On: 05/01/2018 15:34    Labs:  CBC: Recent Labs    04/29/18 1113 04/30/18 0605 05/01/18 0530 05/02/18 0558  WBC 9.8 9.0 8.7 9.3  HGB 9.5* 9.0* 9.9* 9.4*  HCT 30.3* 28.6* 31.1* 29.7*  PLT 405* 354 368 315    COAGS: Recent Labs    04/01/18 1914 04/28/18 0636  INR 1.06 1.02  APTT 20*  --     BMP: Recent Labs    04/29/18 1113 04/30/18 0605 05/01/18 0530 05/02/18 0558  NA 136 137 136 135  K 3.8 3.4* 3.7 3.5  CL 103 103 104 102  CO2 25 27 22 22   GLUCOSE 132* 122* 183* 134*  BUN 7 6 6 6   CALCIUM 8.3* 8.1* 8.6* 8.4*  CREATININE 0.78 0.72 0.70 0.66  GFRNONAA >60 >60 >60 >60  GFRAA >60 >60 >60 >60    LIVER FUNCTION TESTS: Recent Labs    04/29/18 1113 04/30/18 0605 05/01/18 0530 05/02/18 0558  BILITOT 3.0* 1.7* 1.7* 1.2  AST 520* 364* 377* 187*  ALT 607*  495* 497* 384*  ALKPHOS 435* 368* 387* 362*  PROT 7.6 6.6 7.0 7.1  ALBUMIN 3.0* 2.7* 2.9* 2.8*    Assessment and Plan: Pt s/p lap partial cholecystectomy 5/3; biloma drain placement 6/2; ERCP with stone retrieval,ventral panc duct/CBD stenting 6/3; afebrile; WBC nl ; hgb 9.4, creat nl; t bili 1.2; bile cx pend; monitor drain output and once minimal obtain f/u CT    Electronically Signed: D. Rowe Robert, PA-C 05/02/2018, 2:51 PM   I spent a total of 15 minutes at the the patient's bedside AND on the patient's hospital floor or unit, greater than 50% of which was counseling/coordinating care for biloma drain    Patient ID: Cynthia Rosales, female   DOB: 1980/12/01, 37 y.o.   MRN: [id number]

## 2018-05-02 NOTE — Progress Notes (Signed)
Patient ID: Cynthia Rosales, female   DOB: 04/23/1981, 37 y.o.   MRN: [id number] Subjective:  Patient seen after ERCP. She had percutaneous drainage of leaked bile yesterday. She is complaining of pain in her back, 10/10. She also complain of nausea but no vomiting. She denies any SOB. No abdominal distension, she also claims her abdominal pain has improved since after drainage  Objective:  Vital signs in last 24 hours:  Vitals:   05/02/18 1150 05/02/18 1200 05/02/18 1210 05/02/18 1320  BP: (!) 165/88 (!) 162/137 (!) 166/101 (!) 160/93  Pulse: 85 92 85 84  Resp: 18 (!) 24 (!) 24   Temp:    98.1 F (36.7 C)  TempSrc:    Other (Comment)  SpO2: 99% 100% 100% 100%  Weight:      Height:        Intake/Output from previous day:   Intake/Output Summary (Last 24 hours) at 05/02/2018 1545 Last data filed at 05/02/2018 1500 Gross per 24 hour  Intake 1645.83 ml  Output 1786 ml  Net -140.17 ml    Physical Exam: General: Alert, awake, oriented x3, in moderate pain distress, obese  HEENT: [place]/AT PEERL, EOMI Neck: Trachea midline,  no masses, no thyromegal,y no JVD, no carotid bruit OROPHARYNX:  Moist, No exudate/ erythema/lesions.  Heart: Regular rate and rhythm, without murmurs, rubs, gallops, PMI non-displaced, no heaves or thrills on palpation.  Lungs: Clear to auscultation, no wheezing or rhonchi noted. No increased vocal fremitus resonant to percussion  Abdomen: Soft, nontender, nondistended, positive bowel sounds, no masses no hepatosplenomegaly noted..  Neuro: No focal neurological deficits noted cranial nerves II through XII grossly intact. DTRs 2+ bilaterally upper and lower extremities. Strength 5 out of 5 in bilateral upper and lower extremities. Musculoskeletal: No warm swelling or erythema around joints, no spinal tenderness noted. Psychiatric: Patient alert and oriented x3, good insight and cognition, good recent to remote recall. Lymph node survey: No cervical axillary or inguinal  lymphadenopathy noted.  Lab Results:  Basic Metabolic Panel:    Component Value Date/Time   NA 135 05/02/2018 0558   NA 140 11/19/2014 0933   K 3.5 05/02/2018 0558   CL 102 05/02/2018 0558   CO2 22 05/02/2018 0558   BUN 6 05/02/2018 0558   BUN 10 11/19/2014 0933   CREATININE 0.66 05/02/2018 0558   GLUCOSE 134 (H) 05/02/2018 0558   CALCIUM 8.4 (L) 05/02/2018 0558   CBC:    Component Value Date/Time   WBC 9.3 05/02/2018 0558   HGB 9.4 (L) 05/02/2018 0558   HCT 29.7 (L) 05/02/2018 0558   PLT 315 05/02/2018 0558   MCV 68.6 (L) 05/02/2018 0558   NEUTROABS 5.4 05/02/2018 0558   LYMPHSABS 3.2 05/02/2018 0558   MONOABS 0.5 05/02/2018 0558   EOSABS 0.2 05/02/2018 0558   BASOSABS 0.0 05/02/2018 0558    Recent Results (from the past 240 hour(s))  Aerobic/Anaerobic Culture (surgical/deep wound)     Status: None (Preliminary result)   Collection Time: 05/01/18 10:00 PM  Result Value Ref Range Status   Specimen Description   Final    GALL BLADDER DRAINAGE Performed at [facility] [address], [place], [place] [address]    Special Requests   Final    Normal Performed at [facility], [place] [address] [place], [place] [address]    Gram Stain   Final    NO WBC SEEN NO ORGANISMS SEEN Performed at [facility], [place] [address],  [place], [place] [address]    Culture PENDING  Incomplete   Report Status PENDING  Incomplete    Studies/Results: Mr 3d Recon At Scanner  Result Date: 04/30/2018 CLINICAL DATA:  37 year old female with history of abnormal liver function tests. Cholelithiasis. EXAM: MRI ABDOMEN WITHOUT AND WITH CONTRAST (INCLUDING MRCP) TECHNIQUE: Multiplanar multisequence MR imaging of the abdomen was performed both before and after the administration of intravenous contrast. Heavily T2-weighted images of the biliary and pancreatic ducts were obtained, and three-dimensional MRCP images were rendered by post processing.  CONTRAST:  74mL MULTIHANCE GADOBENATE DIMEGLUMINE 529 MG/ML IV SOLN COMPARISON:  No prior abdominal MRI. CT the abdomen and pelvis 04/28/2018. FINDINGS: Comment: Portions of the examination are limited by considerable patient respiratory motion. Lower chest: Unremarkable. Hepatobiliary: Status post cholecystectomy. Complex postoperative fluid collection in the gallbladder fossa measuring approximately 4.5 x 5.4 x 5.2 cm (axial image 21 of series 19 and coronal image 29 of series 11) which demonstrates heterogeneous T1 and T2 signal intensity but is generally T1 hypointense and T2 hyperintense with internal areas of slightly increased T1 hypointensity and decreased T2 intensity. This collection does not demonstrate internal enhancement, and is most compatible with a biloma. No other suspicious hepatic lesions are noted. There multiple filling defects within the common bile duct, compatible with choledocholithiasis. Common bile duct is dilated measuring up to 10 mm in the porta hepatis. There is also some mild intrahepatic biliary ductal dilatation. Pancreas: No pancreatic mass. No pancreatic ductal dilatation noted on MRCP images. No pancreatic or peripancreatic fluid or inflammatory changes. Spleen:  Unremarkable. Adrenals/Urinary Tract: Bilateral kidneys and bilateral adrenal glands are normal in appearance. No hydroureteronephrosis in the visualized portions of the abdomen. Stomach/Bowel: Visualized portions are unremarkable. Vascular/Lymphatic: No aneurysm identified in the visualized abdominal vasculature. No lymphadenopathy noted in the abdomen. Other: No significant volume of ascites noted in the visualized portions of the peritoneal cavity. Musculoskeletal: No aggressive appearing osseous lesions are noted in the visualized portions of the skeleton. IMPRESSION: 1. Choledocholithiasis with mild intra and extrahepatic biliary ductal dilatation concerning for biliary tract obstruction. 2. Persistent  postoperative fluid collection in the gallbladder fossa in this patient status post cholecystectomy, concerning for potential biloma. This could be further evaluated with HIDA scan if clinically appropriate. Electronically Signed   By: Vinnie Langton M.D.   On: 04/30/2018 17:54   Dg Ercp With Sphincterotomy  Result Date: 05/02/2018 CLINICAL DATA:  36 year old female with choledocholithiasis EXAM: ERCP TECHNIQUE: Multiple spot images obtained with the fluoroscopic device and submitted for interpretation post-procedure. FLUOROSCOPY TIME:  Fluoroscopy Time:  12 minutes 22 seconds reported Radiation Exposure Index (if provided by the fluoroscopic device): 230 mGy COMPARISON:  MRCP 04/30/2018 FINDINGS: A total of 22 saved images were obtained and submitted for review. The images demonstrate a flexible endoscope in the descending duodenum with wire cannulation of the hepatic ducts followed by cholangiogram. There is moderate intra and extrahepatic biliary ductal dilatation. Multiple filling defects in the common bile duct are consistent with choledocholithiasis. A percutaneous drainage catheter is present in the right upper quadrant. There is evidence of extravasation of contrast material in the region of the surgical clips, likely at the cystic duct remanent. Subsequent images demonstrate sphincterotomy, balloon sweep of the common duct and ultimately placement of a plastic biliary stent. IMPRESSION: 1. Choledocholithiasis with moderate intra and extrahepatic biliary ductal dilatation. 2. Extravasation of injected contrast material in the gallbladder fossa, likely from the cystic duct remnant. 3. Sphincterotomy, balloon sweep of the common duct  and placement of a plastic biliary stent. These images were submitted for radiologic interpretation only. Please see the procedural report for the amount of contrast and the fluoroscopy time utilized. Electronically Signed   By: Jacqulynn Cadet M.D.   On: 05/02/2018 11:05    Mr Abdomen Mrcp Moise Boring Contast  Result Date: 04/30/2018 CLINICAL DATA:  37 year old female with history of abnormal liver function tests. Cholelithiasis. EXAM: MRI ABDOMEN WITHOUT AND WITH CONTRAST (INCLUDING MRCP) TECHNIQUE: Multiplanar multisequence MR imaging of the abdomen was performed both before and after the administration of intravenous contrast. Heavily T2-weighted images of the biliary and pancreatic ducts were obtained, and three-dimensional MRCP images were rendered by post processing. CONTRAST:  66mL MULTIHANCE GADOBENATE DIMEGLUMINE 529 MG/ML IV SOLN COMPARISON:  No prior abdominal MRI. CT the abdomen and pelvis 04/28/2018. FINDINGS: Comment: Portions of the examination are limited by considerable patient respiratory motion. Lower chest: Unremarkable. Hepatobiliary: Status post cholecystectomy. Complex postoperative fluid collection in the gallbladder fossa measuring approximately 4.5 x 5.4 x 5.2 cm (axial image 21 of series 19 and coronal image 29 of series 11) which demonstrates heterogeneous T1 and T2 signal intensity but is generally T1 hypointense and T2 hyperintense with internal areas of slightly increased T1 hypointensity and decreased T2 intensity. This collection does not demonstrate internal enhancement, and is most compatible with a biloma. No other suspicious hepatic lesions are noted. There multiple filling defects within the common bile duct, compatible with choledocholithiasis. Common bile duct is dilated measuring up to 10 mm in the porta hepatis. There is also some mild intrahepatic biliary ductal dilatation. Pancreas: No pancreatic mass. No pancreatic ductal dilatation noted on MRCP images. No pancreatic or peripancreatic fluid or inflammatory changes. Spleen:  Unremarkable. Adrenals/Urinary Tract: Bilateral kidneys and bilateral adrenal glands are normal in appearance. No hydroureteronephrosis in the visualized portions of the abdomen. Stomach/Bowel: Visualized portions are  unremarkable. Vascular/Lymphatic: No aneurysm identified in the visualized abdominal vasculature. No lymphadenopathy noted in the abdomen. Other: No significant volume of ascites noted in the visualized portions of the peritoneal cavity. Musculoskeletal: No aggressive appearing osseous lesions are noted in the visualized portions of the skeleton. IMPRESSION: 1. Choledocholithiasis with mild intra and extrahepatic biliary ductal dilatation concerning for biliary tract obstruction. 2. Persistent postoperative fluid collection in the gallbladder fossa in this patient status post cholecystectomy, concerning for potential biloma. This could be further evaluated with HIDA scan if clinically appropriate. Electronically Signed   By: Vinnie Langton M.D.   On: 04/30/2018 17:54   Ct Image Guided Drainage By Percutaneous Catheter  Result Date: 05/01/2018 INDICATION: Recent cholecystectomy, now with persistent fluid collection within the gallbladder fossa Please perform image guided percutaneous drainage catheter placement for infection source control purposes EXAM: ULTRASOUND AND CT GUIDED PERCUTANEOUS DRAINAGE CATHETER PLACEMENT INTO THE GALLBLADDER FOSSA COMPARISON:  CT abdomen and pelvis -0 04/28/2018; MRCP - 04/30/2018; nuclear medicine HIDA scan - 04/28/2018 MEDICATIONS: The patient is currently admitted to the hospital and receiving intravenous antibiotics. The antibiotics were administered within an appropriate time frame prior to the initiation of the procedure. ANESTHESIA/SEDATION: Moderate (conscious) sedation was employed during this procedure. A total of Versed 4 mg and Fentanyl 200 mcg was administered intravenously. Moderate Sedation Time: 20 minutes. The patient's level of consciousness and vital signs were monitored continuously by radiology nursing throughout the procedure under my direct supervision. CONTRAST:  None COMPLICATIONS: None immediate. PROCEDURE: Informed written consent was obtained from the  patient after a discussion of the risks, benefits and alternatives to treatment.  The patient was placed supine on the CT gantry and a pre procedural CT was performed re-demonstrating the known abscess/fluid collection within the gallbladder fossa with dominant component measuring approximately 6.0 x 4.9 cm (image 20, series 2). Sonographic evaluation was performed of the right upper abdominal quadrant adequately demonstrating the location of the gallbladder fossa fluid collection. The procedure was planned. A timeout was performed prior to the initiation of the procedure. The skin overlying the right upper abdomen was prepped and draped in the usual sterile fashion. The overlying soft tissues were anesthetized with 1% lidocaine with epinephrine. Under direct ultrasound guidance, an 18 gauge trocar needle was advanced into the abscess/fluid collection and a short Amplatz super stiff wire was coiled within the collection. Appropriate positioning was confirmed with a limited CT scan. The tract was serially dilated allowing placement of a 10 [place] all-purpose drainage catheter. Appropriate positioning was confirmed with a limited postprocedural CT scan. Approximately 60 cc of bilious appearing non foul smelling fluid was aspirated. The tube was connected to a drainage bag and sutured in place. A dressing was placed. The patient tolerated the procedure well without immediate post procedural complication. IMPRESSION: Successful ultrasound and CT guided placement of a 10 French all purpose drain catheter into the gallbladder fossa fluid collection with aspiration of 60 cc of bilious appearing, non foul smelling fluid. Samples were sent to the laboratory (including for the acquisition of bilirubin levels) as requested by the ordering clinical team. Electronically Signed   By: Sandi Mariscal M.D.   On: 05/01/2018 15:34    Medications: Scheduled Meds: . HYDROmorphone   Intravenous Q4H  . insulin aspart  0-9 Units  Subcutaneous Q4H  . insulin glargine  10 Units Subcutaneous Daily  . sodium chloride flush  5 mL Intracatheter Q8H   Continuous Infusions: . cefTRIAXone (ROCEPHIN)  IV Stopped (05/01/18 1854)  . diphenhydrAMINE    . lactated ringers 1,000 mL (05/02/18 1255)   PRN Meds:.albuterol, diphenhydrAMINE, diphenhydrAMINE-zinc acetate, hydrALAZINE, HYDROmorphone (DILAUDID) injection, ibuprofen, ondansetron **OR** ondansetron (ZOFRAN) IV  Consultants:  GI/GS  Procedures:  MRCP, CT Guided percutaneous bile drainage, ERCP with stone extraction  Antibiotics:  Ceftriaxone  Assessment/Plan: Principal Problem:   Abdominal pain Active Problems:   HTN (hypertension), benign   Diabetes mellitus type 2 in obese ([place])   Acute cholecystitis s/p lap cholecystectomy 04/03/2018   Morbid obesity with body mass index (BMI) of 50.0 to 59.9 in adult [facility])   Transaminitis   Anemia   Choledocholithiasis   Intra-abdominal fluid collection   Bile leak, postoperative  1. Choledocholithiasis and Biloma: Patient is S/P percutaneous CT Guided drainage of bile and she had ERCP for stone extractions earlier today. ERCP was significant for CBD stones and stones in GB remnant, bile leak from GB remnant; biliary sphincterectomy and placement of stents in ventral pancreatic duct and CBD was performed by Dr. Pincus Sanes. She is complaining of back pain. Will continue IV antibiotics, adjust the dose of Dilaudid in PCA, add clinician assisted doses for pain control. Continue IVF and other therapies per gastroenterologist and general surgery.  2. Hb Sickle Cell Disease with crisis: Continue IVF D5 .45% Saline @ 125 mls/hour, Dilaudid PCA as above, continue IV Toradol 30 mg Q 6 H, Monitor vitals very closely, Re-evaluate pain scale regularly, 2 L of Oxygen by [place]. 3. Type 2 Diabetes Mellitus:Continue Insulin regimen with sliding scale. Monitor CBG 4. Hypertension: Willcontinuehome meds.continue prn IV Hydralazineprn 5. Sickle  Cell Anemia:Hb is stable at baseline, will continue to  monitor 6. Chronic pain Syndrome:Continue current medication regimen  Code Status: Full Code Family Communication: N/A Disposition Plan: Not yet ready for discharge  Aydden Cumpian  If 7PM-7AM, please contact night-coverage.  05/02/2018, 3:45 PM  LOS: 4 days

## 2018-05-02 NOTE — Op Note (Signed)
[facility] Patient Name: Cynthia Rosales Procedure Date: 05/02/2018 MRN: [id number] Attending MD: Estill Cotta. Loletha Carrow , MD Date of Birth: [birth year] CSN: [id number] Age: 37 Admit Type: Inpatient Procedure:                ERCP Indications:              For therapy of bile duct stone(s), Treatment of                            bile leak Providers:                Mallie Mussel L. Loletha Carrow, MD, Burtis Junes, RN, Charolette Child,                            Technician Referring MD:             Neysa Bonito, MD Medicines:                General Anesthesia, Cipro 400 mg IV, Indomethacin                            381 mg PR Complications:            No immediate complications. Estimated Blood Loss:     Estimated blood loss: none. Procedure:                Pre-Anesthesia Assessment:                           - Prior to the procedure, a History and Physical                            was performed, and patient medications and                            allergies were reviewed. The patient's tolerance of                            previous anesthesia was also reviewed. The risks                            and benefits of the procedure and the sedation                            options and risks were discussed with the patient.                            All questions were answered, and informed consent                            was obtained. Prior Anticoagulants: The patient has                            taken no previous anticoagulant or antiplatelet  agents. ASA Grade Assessment: III - A patient with                            severe systemic disease. After reviewing the risks                            and benefits, the patient was deemed in                            satisfactory condition to undergo the procedure.                           After obtaining informed consent, the scope was                            passed under direct vision. Throughout the                procedure, the patient's blood pressure, pulse, and                            oxygen saturations were monitored continuously. The                            [id number]) scope was introduced through                            the mouth, and used to inject contrast into and                            used to inject contrast into the bile duct. The                            ERCP was performed with difficulty due to                            challenging cannulation. The patient tolerated the                            procedure well. Scope In: Scope Out: Findings:      A scout film of the abdomen was obtained. Surgical clips, consistent       with a previous cholecystectomy, and an external biliary drain were seen       in the area of the right upper quadrant of the abdomen. The esophagus       was successfully intubated under direct vision. The scope was advanced       to a normal major papilla in the descending duodenum without detailed       examination of the pharynx, larynx and associated structures, and upper       GI tract. The upper GI tract was grossly normal. Superficial wire       cannulation of the ventral pancreatic duct was inadvertantly       accomplished with the traction (standard) sphincterotome. (Difficult       cannulation of bile duct) 0.035 inch x 260 cm straight Hydra Jagwire  was       passed into the ventral pancreatic duct. Attempts to pass a wire or       cannula into the bile duct alongside the PD stent were unsuccessful. One       4 Fr by 3 cm plastic stent with a single external pigtail and a single       internal flap was placed 3 cm into the ventral pancreatic duct. Clear       fluid flowed through the stent. The stent was in good position. A long       0.025 inch Antonietta Breach was passed into the biliary tree. The tapered       sphincterotome was passed over the guidewire and the bile duct was then       deeply cannulated. Contrast was  injected. I personally interpreted the       bile duct images. There was brisk flow of contrast through the ducts.       Image quality was excellent. Contrast extended to the hepatic ducts. The       common bile duct and gallbladder remnant contained multiple stones (4 in       CBD and 2 in GB remnant), the largest of which was 6 mm in diameter. The       main bile duct was diffusely dilated. The largest diameter was 12 mm. A       cholecystectomy had been performed. Extravasation of contrast       originating from the gallbladder remnant was observed. An 8 mm biliary       sphincterotomy was made with a traction (standard) sphincterotome using       blended current. There was no post-sphincterotomy bleeding. The biliary       tree was swept with a 9-12 mm balloon starting at the bifurcation. All       stones were removed. One 10 Fr by 7 cm plastic stent with a single       external flap and a single internal flap was placed 6 cm into the common       bile duct. Bile flowed through the stent. The stent was in good       position. Indomethacin 100 mg was given via suppository to decrease the       risk of post-ERCP pancreatitis (PEP). The total fluoroscopy exposure       time was 12 minutes and 22 seconds. Impression:               - The entire main bile duct was dilated.                           - A bile leak was found.                           - The patient has had a cholecystectomy.                           - Choledocholithiasis was found. Complete removal                            was accomplished by biliary sphincterotomy and  balloon extraction.                           - One plastic stent was placed into the ventral                            pancreatic duct.                           - A biliary sphincterotomy was performed.                           - The biliary tree was swept.                           - One plastic stent was placed into the common  bile                            duct.                           - Indomethacin given to decrease risk of post-ERCP                            pancreatitis. Moderate Sedation:      GETA Recommendation:           - Clear liquid diet for first 6 hours, then advance                            as tolerated (if no vomiting or increase in upper                            abdominal pain) to low fat diet. Procedure Code(s):        --- Professional ---                           [phone], Endoscopic retrograde                            cholangiopancreatography (ERCP); with placement of                            endoscopic stent into biliary or pancreatic duct,                            including pre- and post-dilation and guide wire                            passage, when performed, including sphincterotomy,                            when performed, each stent                           [id number], 17, Endoscopic retrograde  cholangiopancreatography (ERCP); with placement of                            endoscopic stent into biliary or pancreatic duct,                            including pre- and post-dilation and guide wire                            passage, when performed, including sphincterotomy,                            when performed, each stent                           [id number], Endoscopic retrograde                            cholangiopancreatography (ERCP); with removal of                            calculi/debris from biliary/pancreatic duct(s) Diagnosis Code(s):        --- Professional ---                           K83.8, Other specified diseases of biliary tract                           K83.9, Disease of biliary tract, unspecified                           Z90.49, Acquired absence of other specified parts                            of digestive tract                           K80.50, Calculus of bile duct without cholangitis                            or  cholecystitis without obstruction CPT copyright 2017 [employer]. All rights reserved. The codes documented in this report are preliminary and upon coder review may  be revised to meet current compliance requirements. Henry L. Loletha Carrow, MD 05/02/2018 11:09:21 AM This report has been signed electronically. Number of Addenda: 0

## 2018-05-02 NOTE — Anesthesia Procedure Notes (Signed)
Procedure Name: Intubation Date/Time: 05/02/2018 8:48 AM Performed by: Lollie Sails, CRNA Pre-anesthesia Checklist: Patient identified, Emergency Drugs available, Suction available, Patient being monitored and Timeout performed Patient Re-evaluated:Patient Re-evaluated prior to induction Oxygen Delivery Method: Circle system utilized Preoxygenation: Pre-oxygenation with 100% oxygen Induction Type: IV induction and Cricoid Pressure applied Laryngoscope Size: Mac and 4 Grade View: Grade I Tube type: Oral Tube size: 7.5 mm Number of attempts: 1 Airway Equipment and Method: Stylet Placement Confirmation: ETT inserted through vocal cords under direct vision,  positive ETCO2 and breath sounds checked- equal and bilateral Secured at: 22 cm Tube secured with: Tape Dental Injury: Teeth and Oropharynx as per pre-operative assessment

## 2018-05-02 NOTE — Interval H&P Note (Signed)
History and Physical Interval Note:  05/02/2018 8:37 AM  Cynthia Rosales  has presented today for surgery, with the diagnosis of CBD stones  The various methods of treatment have been discussed with the patient and family. After consideration of risks, benefits and other options for treatment, the patient has consented to  Procedure(s): ENDOSCOPIC RETROGRADE CHOLANGIOPANCREATOGRAPHY (ERCP) (N/A) as a surgical intervention .  The patient's history has been reviewed, patient examined, no change in status, stable for surgery.  I have reviewed the patient's chart and labs.  Questions were answered to the patient's satisfaction.      RUQ drain with dark bile.  Nelida Meuse III

## 2018-05-02 NOTE — Transfer of Care (Signed)
Immediate Anesthesia Transfer of Care Note  Patient: Cynthia Rosales  Procedure(s) Performed: ENDOSCOPIC RETROGRADE CHOLANGIOPANCREATOGRAPHY (ERCP) (N/A )  Patient Location: PACU and Endoscopy Unit  Anesthesia Type:General  Level of Consciousness: awake and patient cooperative  Airway & Oxygen Therapy: Patient Spontanous Breathing and Patient connected to face mask oxygen  Post-op Assessment: Report given to RN and Post -op Vital signs reviewed and stable  Post vital signs: Reviewed and stable  Last Vitals:  Vitals Value Taken Time  BP 159/98 05/02/2018 10:59 AM  Temp 36.7 C 05/02/2018 10:59 AM  Pulse 104 05/02/2018 11:00 AM  Resp 18 05/02/2018 11:00 AM  SpO2 100 % 05/02/2018 11:00 AM  Vitals shown include unvalidated device data.  Last Pain:  Vitals:   05/02/18 1059  TempSrc: Oral  PainSc:       Patients Stated Pain Goal: 1 (62/37/62 8315)  Complications: No apparent anesthesia complications

## 2018-05-03 ENCOUNTER — Encounter (HOSPITAL_COMMUNITY): Payer: Self-pay | Admitting: Gastroenterology

## 2018-05-03 ENCOUNTER — Telehealth: Payer: Self-pay

## 2018-05-03 DIAGNOSIS — Z9689 Presence of other specified functional implants: Secondary | ICD-10-CM

## 2018-05-03 DIAGNOSIS — R945 Abnormal results of liver function studies: Secondary | ICD-10-CM

## 2018-05-03 LAB — COMPREHENSIVE METABOLIC PANEL
ALT: 299 U/L — ABNORMAL HIGH (ref 14–54)
AST: 123 U/L — ABNORMAL HIGH (ref 15–41)
Albumin: 2.8 g/dL — ABNORMAL LOW (ref 3.5–5.0)
Alkaline Phosphatase: 310 U/L — ABNORMAL HIGH (ref 38–126)
Anion gap: 9 (ref 5–15)
BUN: 12 mg/dL (ref 6–20)
CO2: 26 mmol/L (ref 22–32)
Calcium: 8.3 mg/dL — ABNORMAL LOW (ref 8.9–10.3)
Chloride: 100 mmol/L — ABNORMAL LOW (ref 101–111)
Creatinine, Ser: 1.08 mg/dL — ABNORMAL HIGH (ref 0.44–1.00)
GFR calc Af Amer: >60 mL/min (ref >60)
GFR calc non Af Amer: >60 mL/min (ref >60)
Glucose, Bld: 297 mg/dL — ABNORMAL HIGH (ref 65–99)
Potassium: 3.9 mmol/L (ref 3.5–5.1)
Sodium: 135 mmol/L (ref 135–145)
Total Bilirubin: 0.7 mg/dL (ref 0.3–1.2)
Total Protein: 6.6 g/dL (ref 6.5–8.1)

## 2018-05-03 LAB — GLUCOSE, CAPILLARY
Glucose-Capillary: 283 mg/dL — ABNORMAL HIGH (ref 65–99)
Glucose-Capillary: 190 mg/dL — ABNORMAL HIGH (ref 65–99)
Glucose-Capillary: 272 mg/dL — ABNORMAL HIGH (ref 65–99)
Glucose-Capillary: 271 mg/dL — ABNORMAL HIGH (ref 65–99)
Glucose-Capillary: 246 mg/dL — ABNORMAL HIGH (ref 65–99)

## 2018-05-03 MED ORDER — POLYETHYLENE GLYCOL 3350 17 G PO PACK
17.0000 g | PACK | Freq: Every day | ORAL | Status: DC
  Administered 2018-05-03 – 2018-05-04 (×2): 17 g via ORAL
  Filled 2018-05-03 (×2): qty 1

## 2018-05-03 MED ORDER — PHENOL 1.4 % MT LIQD
1.0000 | OROMUCOSAL | Status: DC | PRN
  Filled 2018-05-03: qty 177

## 2018-05-03 MED ORDER — DOCUSATE SODIUM 100 MG PO CAPS
100.0000 mg | ORAL_CAPSULE | Freq: Two times a day (BID) | ORAL | Status: DC
  Administered 2018-05-03 – 2018-05-04 (×3): 100 mg via ORAL
  Filled 2018-05-03 (×3): qty 1

## 2018-05-03 NOTE — Telephone Encounter (Signed)
KUB in Epic the pt will be called a day or two prior to 6/14 to remind her to have xray.

## 2018-05-03 NOTE — Progress Notes (Signed)
Patient ID: Cynthia Rosales, female   DOB: 08-01-1981, 37 y.o.   MRN: [id number] Subjective:  Patient reports feeling better today, she has no new complaint. Her son is graduating from elementary school tomorrow and she wonders if she could be discharged so she can attend. She denies any fever, no nausea or vomiting today, no chest pain, no SOB  Objective:  Vital signs in last 24 hours:  Vitals:   05/03/18 0601 05/03/18 0730 05/03/18 1115 05/03/18 1212  BP: 128/89  (!) 143/101   Pulse: 89  (!) 121   Resp: 18 18 17 17   Temp: 98.2 F (36.8 C)  97.8 F (36.6 C)   TempSrc:   Oral   SpO2: 99% 99% 96% 96%  Weight:      Height:        Intake/Output from previous day:   Intake/Output Summary (Last 24 hours) at 05/03/2018 1704 Last data filed at 05/03/2018 1030 Gross per 24 hour  Intake 250 ml  Output 900 ml  Net -650 ml    Physical Exam: General: Alert, awake, oriented x3, in no acute distress. Obese HEENT: [place]/AT [place], EOMI Neck: Trachea midline,  no masses, no thyromegal,y no JVD, no carotid bruit OROPHARYNX:  Moist, No exudate/ erythema/lesions.  Heart: Regular rate and rhythm, without murmurs, rubs, gallops, PMI non-displaced, no heaves or thrills on palpation.  Lungs: Clear to auscultation, no wheezing or rhonchi noted. No increased vocal fremitus resonant to percussion  Abdomen: Soft, mild RUQ tenderness around biliary drain site, output small amt of bile. Normal bowel sounds nondistended, positive bowel sounds, no masses no hepatosplenomegaly noted..  Neuro: No focal neurological deficits noted cranial nerves II through XII grossly intact. DTRs 2+ bilaterally upper and lower extremities. Strength 5 out of 5 in bilateral upper and lower extremities. Musculoskeletal: No warm swelling or erythema around joints, no spinal tenderness noted. Psychiatric: Patient alert and oriented x3, good insight and cognition, good recent to remote recall. Lymph node survey: No cervical axillary or  inguinal lymphadenopathy noted.  Lab Results:  Basic Metabolic Panel:    Component Value Date/Time   NA 135 05/03/2018 0557   NA 140 11/19/2014 0933   K 3.9 05/03/2018 0557   CL 100 (L) 05/03/2018 0557   CO2 26 05/03/2018 0557   BUN 12 05/03/2018 0557   BUN 10 11/19/2014 0933   CREATININE 1.08 (H) 05/03/2018 0557   GLUCOSE 297 (H) 05/03/2018 0557   CALCIUM 8.3 (L) 05/03/2018 0557   CBC:    Component Value Date/Time   WBC 9.3 05/02/2018 0558   HGB 9.4 (L) 05/02/2018 0558   HCT 29.7 (L) 05/02/2018 0558   PLT 315 05/02/2018 0558   MCV 68.6 (L) 05/02/2018 0558   NEUTROABS 5.4 05/02/2018 0558   LYMPHSABS 3.2 05/02/2018 0558   MONOABS 0.5 05/02/2018 0558   EOSABS 0.2 05/02/2018 0558   BASOSABS 0.0 05/02/2018 0558    Recent Results (from the past 240 hour(s))  Aerobic/Anaerobic Culture (surgical/deep wound)     Status: None (Preliminary result)   Collection Time: 05/01/18 10:00 PM  Result Value Ref Range Status   Specimen Description   Final    GALL BLADDER DRAINAGE Performed at [facility] [address] [place], [place] [address]    Special Requests   Final    Normal Performed at [facility], [place] [address] [place], [place] [address]    Gram Stain NO WBC SEEN NO ORGANISMS SEEN   Final   Culture  Final    NO GROWTH 1 DAY NO ANAEROBES ISOLATED; CULTURE IN PROGRESS FOR 5 DAYS Performed at [facility], [place] [address], [place], [place] [address]    Report Status PENDING  Incomplete    Studies/Results: Dg Ercp With Sphincterotomy  Result Date: 05/02/2018 CLINICAL DATA:  37 year old female with choledocholithiasis EXAM: ERCP TECHNIQUE: Multiple spot images obtained with the fluoroscopic device and submitted for interpretation post-procedure. FLUOROSCOPY TIME:  Fluoroscopy Time:  12 minutes 22 seconds reported Radiation Exposure Index (if provided by the fluoroscopic device): 230 mGy COMPARISON:  MRCP 04/30/2018 FINDINGS:  A total of 22 saved images were obtained and submitted for review. The images demonstrate a flexible endoscope in the descending duodenum with wire cannulation of the hepatic ducts followed by cholangiogram. There is moderate intra and extrahepatic biliary ductal dilatation. Multiple filling defects in the common bile duct are consistent with choledocholithiasis. A percutaneous drainage catheter is present in the right upper quadrant. There is evidence of extravasation of contrast material in the region of the surgical clips, likely at the cystic duct remanent. Subsequent images demonstrate sphincterotomy, balloon sweep of the common duct and ultimately placement of a plastic biliary stent. IMPRESSION: 1. Choledocholithiasis with moderate intra and extrahepatic biliary ductal dilatation. 2. Extravasation of injected contrast material in the gallbladder fossa, likely from the cystic duct remnant. 3. Sphincterotomy, balloon sweep of the common duct and placement of a plastic biliary stent. These images were submitted for radiologic interpretation only. Please see the procedural report for the amount of contrast and the fluoroscopy time utilized. Electronically Signed   By: Jacqulynn Cadet M.D.   On: 05/02/2018 11:05    Medications: Scheduled Meds: . docusate sodium  100 mg Oral BID  . HYDROmorphone   Intravenous Q4H  . insulin aspart  0-9 Units Subcutaneous Q4H  . insulin glargine  10 Units Subcutaneous Daily  . polyethylene glycol  17 g Oral Daily  . sodium chloride flush  5 mL Intracatheter Q8H   Continuous Infusions: . cefTRIAXone (ROCEPHIN)  IV Stopped (05/03/18 1605)  . diphenhydrAMINE     PRN Meds:.albuterol, diphenhydrAMINE, diphenhydrAMINE-zinc acetate, hydrALAZINE, HYDROmorphone (DILAUDID) injection, ibuprofen, ondansetron **OR** ondansetron (ZOFRAN) IV, phenol  Consultants:  GI/GS  Procedures:  MRCP, CT Guided percutaneous bile drainage, ERCP with stone  extraction  Antibiotics:  Ceftriaxone  Assessment/Plan: Principal Problem:   Abdominal pain Active Problems:   HTN (hypertension), benign   Diabetes mellitus type 2 in obese ([place])   Acute cholecystitis s/p lap cholecystectomy 04/03/2018   Morbid obesity with body mass index (BMI) of 50.0 to 59.9 in adult [facility])   Transaminitis   Anemia   Choledocholithiasis   Intra-abdominal fluid collection   Bile leak, postoperative  1. Choledocholithiasis and Biloma: Patient is S/P percutaneous CT Guided drainage of bile and she had ERCP for stone extractions. ERCP was significant for CBD stones and stones in GB remnant, bile leak from GB remnant; biliary sphincterectomy and placement of stents in ventral pancreatic duct and CBD was performed by Dr. Pincus Sanes. Discussed with Dr. Pincus Sanes, patient is stable for discharge home when "medically cleared". Pain is much improved today. Start regular diet, continue IV antibiotics, Continue Dilaudid via PCA, and clinician assisted doses for pain control. Continue IVF and other therapies per gastroenterologist and general surgery. For possible discharge home tomorrow 2. Hb Sickle Cell Disease with crisis: Reduce IVF to 75 mls/hour, Dilaudid PCA as above, continue IV Toradol 30 mg Q 6 H, Monitor vitals very closely, Re-evaluate pain scale  regularly, 2 L of Oxygen by [place]. 3. Type 2 Diabetes Mellitus:Nutritionist's input appreciated. Continue Insulin regimen with sliding scale. Monitor CBG 4. Hypertension: Controlled. Willcontinuehome meds.continue prn IV Hydralazine prn with parameters 5. 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 Chronic pain Syndrome:Continue current medication regimen  Code Status: Full Code Family Communication: N/A Disposition Plan: Not yet ready for discharge  Aking Klabunde  If 7PM-7AM, please contact night-coverage.  05/03/2018, 5:04 PM  LOS: 5 days

## 2018-05-03 NOTE — Telephone Encounter (Signed)
-----   Message from [place], [place] sent at 05/03/2018  3:12 PM EDT ----- Regarding: Please schedule patient for KUB in 10 days Pt needs KUB ordered under Dr. Henrene Pastor in 10 days (so 05/13/18) so we can see if PD stent went out on own or she needs EGD. She will need to be called and told this and if you wouldn't remind maybe calling her again the day before to make sure she has it done and also watch for result to come in. It is very important.  Thanks-JLL

## 2018-05-03 NOTE — Progress Notes (Signed)
[facility] Progress Note  1 Day Post-Op  Subjective: CC-  Patient states that she is feeling much better than yesterday. Abdominal pain improving. No more n/v. Tolerated a regular diet for dinner. No BM since admission.  Objective: Vital signs in last 24 hours: Temp:  [98.1 F (36.7 C)-98.5 F (36.9 C)] 98.2 F (36.8 C) (06/04 0601) Pulse Rate:  [84-123] 89 (06/04 0601) Resp:  [13-24] 18 (06/04 0730) BP: (128-189)/(88-137) 128/89 (06/04 0601) SpO2:  [97 %-100 %] 99 % (06/04 0730) Last BM Date: 04/27/18  Intake/Output from previous day: 06/03 0701 - 06/04 0700 In: 1645.8 [I.V.:1235.8; IV Piggyback:400] Out: 1710 [Urine:1550; Drains:150; Blood:10] Intake/Output this shift: No intake/output data recorded.  PE: Gen:  Alert, NAD, pleasant HEENT: EOM's intact, pupils equal and round Pulm:  effort normal Abd: obese, soft, mild TTP around drain, +BS, drain with minimal bilious fluid in bag > 150cc/24 hours Skin: warm and dry  Lab Results:  Recent Labs    05/01/18 0530 05/02/18 0558  WBC 8.7 9.3  HGB 9.9* 9.4*  HCT 31.1* 29.7*  PLT 368 315   BMET Recent Labs    05/02/18 0558 05/03/18 0557  NA 135 135  K 3.5 3.9  CL 102 100*  CO2 22 26  GLUCOSE 134* 297*  BUN 6 12  CREATININE 0.66 1.08*  CALCIUM 8.4* 8.3*   PT/INR No results for input(s): LABPROT, INR in the last 72 hours. CMP     Component Value Date/Time   NA 135 05/03/2018 0557   NA 140 11/19/2014 0933   K 3.9 05/03/2018 0557   CL 100 (L) 05/03/2018 0557   CO2 26 05/03/2018 0557   GLUCOSE 297 (H) 05/03/2018 0557   BUN 12 05/03/2018 0557   BUN 10 11/19/2014 0933   CREATININE 1.08 (H) 05/03/2018 0557   CALCIUM 8.3 (L) 05/03/2018 0557   PROT 6.6 05/03/2018 0557   PROT 6.7 11/19/2014 0933   ALBUMIN 2.8 (L) 05/03/2018 0557   ALBUMIN 3.3 (L) 11/19/2014 0933   AST 123 (H) 05/03/2018 0557   ALT 299 (H) 05/03/2018 0557   ALKPHOS 310 (H) 05/03/2018 0557   BILITOT 0.7 05/03/2018 0557   GFRNONAA >60 05/03/2018 0557   GFRAA >60 05/03/2018 0557   Lipase     Component Value Date/Time   LIPASE 41 05/01/2018 0530       Studies/Results: Dg Ercp With Sphincterotomy  Result Date: 05/02/2018 CLINICAL DATA:  37 year old female with choledocholithiasis EXAM: ERCP TECHNIQUE: Multiple spot images obtained with the fluoroscopic device and submitted for interpretation post-procedure. FLUOROSCOPY TIME:  Fluoroscopy Time:  12 minutes 22 seconds reported Radiation Exposure Index (if provided by the fluoroscopic device): 230 mGy COMPARISON:  MRCP 04/30/2018 FINDINGS: A total of 22 saved images were obtained and submitted for review. The images demonstrate a flexible endoscope in the descending duodenum with wire cannulation of the hepatic ducts followed by cholangiogram. There is moderate intra and extrahepatic biliary ductal dilatation. Multiple filling defects in the common bile duct are consistent with choledocholithiasis. A percutaneous drainage catheter is present in the right upper quadrant. There is evidence of extravasation of contrast material in the region of the surgical clips, likely at the cystic duct remanent. Subsequent images demonstrate sphincterotomy, balloon sweep of the common duct and ultimately placement of a plastic biliary stent. IMPRESSION: 1. Choledocholithiasis with moderate intra and extrahepatic biliary ductal dilatation. 2. Extravasation of injected contrast material in the gallbladder fossa, likely from the cystic duct remnant. 3. Sphincterotomy, balloon sweep of  the common duct and placement of a plastic biliary stent. These images were submitted for radiologic interpretation only. Please see the procedural report for the amount of contrast and the fluoroscopy time utilized. Electronically Signed   By: Jacqulynn Cadet M.D.   On: 05/02/2018 11:05   Ct Image Guided Drainage By Percutaneous Catheter  Result Date: 05/01/2018 INDICATION: Recent cholecystectomy, now with  persistent fluid collection within the gallbladder fossa Please perform image guided percutaneous drainage catheter placement for infection source control purposes EXAM: ULTRASOUND AND CT GUIDED PERCUTANEOUS DRAINAGE CATHETER PLACEMENT INTO THE GALLBLADDER FOSSA COMPARISON:  CT abdomen and pelvis -0 04/28/2018; MRCP - 04/30/2018; nuclear medicine HIDA scan - 04/28/2018 MEDICATIONS: The patient is currently admitted to the hospital and receiving intravenous antibiotics. The antibiotics were administered within an appropriate time frame prior to the initiation of the procedure. ANESTHESIA/SEDATION: Moderate (conscious) sedation was employed during this procedure. A total of Versed 4 mg and Fentanyl 200 mcg was administered intravenously. Moderate Sedation Time: 20 minutes. The patient's level of consciousness and vital signs were monitored continuously by radiology nursing throughout the procedure under my direct supervision. CONTRAST:  None COMPLICATIONS: None immediate. PROCEDURE: Informed written consent was obtained from the patient after a discussion of the risks, benefits and alternatives to treatment. The patient was placed supine on the CT gantry and a pre procedural CT was performed re-demonstrating the known abscess/fluid collection within the gallbladder fossa with dominant component measuring approximately 6.0 x 4.9 cm (image 20, series 2). Sonographic evaluation was performed of the right upper abdominal quadrant adequately demonstrating the location of the gallbladder fossa fluid collection. The procedure was planned. A timeout was performed prior to the initiation of the procedure. The skin overlying the right upper abdomen was prepped and draped in the usual sterile fashion. The overlying soft tissues were anesthetized with 1% lidocaine with epinephrine. Under direct ultrasound guidance, an 18 gauge trocar needle was advanced into the abscess/fluid collection and a short Amplatz super stiff wire was  coiled within the collection. Appropriate positioning was confirmed with a limited CT scan. The tract was serially dilated allowing placement of a 10 [place] all-purpose drainage catheter. Appropriate positioning was confirmed with a limited postprocedural CT scan. Approximately 60 cc of bilious appearing non foul smelling fluid was aspirated. The tube was connected to a drainage bag and sutured in place. A dressing was placed. The patient tolerated the procedure well without immediate post procedural complication. IMPRESSION: Successful ultrasound and CT guided placement of a 10 French all purpose drain catheter into the gallbladder fossa fluid collection with aspiration of 60 cc of bilious appearing, non foul smelling fluid. Samples were sent to the laboratory (including for the acquisition of bilirubin levels) as requested by the ordering clinical team. Electronically Signed   By: Sandi Mariscal M.D.   On: 05/01/2018 15:34    Anti-infectives: Anti-infectives (From admission, onward)   Start     Dose/Rate Route Frequency Ordered Stop   05/02/18 0830  ciprofloxacin (CIPRO) IVPB 400 mg     400 mg 200 mL/hr over 60 Minutes Intravenous  Once 05/02/18 0827 05/02/18 0959   04/28/18 1500  cefTRIAXone (ROCEPHIN) 2 g in sodium chloride 0.9 % 100 mL IVPB     2 g 200 mL/hr over 30 Minutes Intravenous Every 24 hours 04/28/18 1055     04/28/18 0930  aztreonam (AZACTAM) 2 g in sodium chloride 0.9 % 100 mL IVPB     2 g 200 mL/hr over 30 Minutes Intravenous  Once 04/28/18 0929 04/28/18 1100       Assessment/Plan Chronic narcotic use - takes oxycodone 30mg  q4-6 hours daily at home DM HTN Sickle cell disease  Abdominal pain Post-operative bile leak S/p subtotal cholecystectomy 5/3 -Cholangiogram was unable to be performed during surgery. A partial cholecystectomy was performed to avoid injury to the CBD.  -   5/30 CT scan showed GB fossa fluid collection - appears to be simple fluid, suspect biloma. -    5/31 HIDA not able to definitively r/o post-op leak.  -   6/1 MRCP confirms choledocholithiasis  -   6/2 percutaneous drainage of biloma. Culture NGTD. -   6/3 ERCP significant for CBD stones and stones in GB remnant, bile leak from GB remnant; biliary sphincterectomy and placement of stents in ventral pancreatic duct and CBD by Dr. Pincus Sanes.  -  LFTs trending down, abdominal pain improved, tolerating diet. Add bowel regimen. Encourage OOB/to chair today. Patient will need to be off IV pain medication prior to discharge.  FEN: regular diet, colace/miralax ID: Rocephin 5/30 >> VTE: SCD's, ok for chemical DVT prophylaxis from surgical standpoint Foley: wick Follow up: Dr. Donne Hazel    LOS: 5 days    Wellington [place] , [facility] Surgery 05/03/2018, 9:34 AM Pager: [phone] Consults: [phone] Mon 7:00 am -11:30 AM Tues-Fri 7:00 am-4:30 pm Sat-Sun 7:00 am-11:30 am

## 2018-05-03 NOTE — Progress Notes (Signed)
Referring Physician(s): Gross,S  Supervising Physician: Marybelle Killings  Patient Status:  [facility] - In-pt  Chief Complaint:  Abdominal pain, bile leak  Subjective: Pt feeling better today; more alert/talkative; has some RUQ soreness with drain flushes; denies N/V; currently eating   Allergies: Amoxicillin; Buprenorphine hcl; and Morphine and related  Medications: Prior to Admission medications   Medication Sig Start Date End Date Taking? Authorizing Provider  AMITIZA 24 MCG capsule Take 24 mcg by mouth daily with breakfast.  04/27/18  Yes [provider]  hydrochlorothiazide (HYDRODIURIL) 25 MG tablet Take 25 mg by mouth daily.  04/27/18  Yes [provider]  insulin NPH-regular Human (NOVOLIN 70/30) (70-30) 100 UNIT/ML injection Inject 35-36 Units into the skin See admin instructions. Inject 35 units subcutaneously after breakfast and 36 units after supper   Yes [provider]  metFORMIN (GLUCOPHAGE) 1000 MG tablet Take 1,000 mg by mouth daily with breakfast.  04/27/18  Yes [provider]  ondansetron (ZOFRAN ODT) 4 MG disintegrating tablet Take 1 tablet (4 mg total) by mouth every 8 (eight) hours as needed for nausea or vomiting. 04/19/18  Yes Lorin Glass, PA-C  oxycodone (ROXICODONE) 30 MG immediate release tablet Take 30 mg by mouth every 6 (six) hours as needed for pain.   Yes [provider]  sitaGLIPtin (JANUVIA) 100 MG tablet Take 100 mg by mouth daily.   Yes [provider]  albuterol (PROVENTIL HFA;VENTOLIN HFA) 108 (90 Base) MCG/ACT inhaler Inhale 2 puffs into the lungs every 6 (six) hours as needed for wheezing or shortness of breath.    [provider]  diazepam (VALIUM) 5 MG tablet take 1 tablet BY MOUTH THREE TIMES DAILY AS NEEDED Patient taking differently: Take 5 mg by mouth every 8 (eight) hours as needed for anxiety (pain). take 1 tablet BY MOUTH THREE TIMES DAILY AS NEEDED 04/27/18   Melvenia Beam, MD  hydrochlorothiazide (HYDRODIURIL) 12.5 MG tablet Take 1 tablet (12.5 mg total) by mouth daily. Patient not taking: Reported on 04/27/2018 04/06/18   Roxan Hockey, MD  Prenat w/o A Vit-FeFum-FePo-FA (CONCEPT OB) 130-92.4-1 MG CAPS Take 1 capsule by mouth daily. Patient not taking: Reported on 11/15/2017 08/09/17   [name], MD     Vital Signs: BP 128/89 (BP Location: Right Arm)   Pulse 89   Temp 98.2 F (36.8 C)   Resp 18   Ht 5\' 4"  (1.626 m)   Wt (!) 310 lb (140.6 kg)   LMP 05/02/2018 Comment: negative beta CHG 04/27/17  SpO2 99%   BMI 53.21 kg/m   Physical Exam RUQ drain intact, insertion site ok, mildly tender, output 150 cc green bile  Imaging: Mr 3d Recon At Scanner  Result Date: 04/30/2018 CLINICAL DATA:  37 year old female with history of abnormal liver function tests. Cholelithiasis. EXAM: MRI ABDOMEN WITHOUT AND WITH CONTRAST (INCLUDING MRCP) TECHNIQUE: Multiplanar multisequence MR imaging of the abdomen was performed both before and after the administration of intravenous contrast. Heavily T2-weighted images of the biliary and pancreatic ducts were obtained, and three-dimensional MRCP images were rendered by post processing. CONTRAST:  35mL MULTIHANCE GADOBENATE DIMEGLUMINE 529 MG/ML IV SOLN COMPARISON:  No prior abdominal MRI. CT the abdomen and pelvis 04/28/2018. FINDINGS: Comment: Portions of the examination are limited by considerable patient respiratory motion. Lower chest: Unremarkable. Hepatobiliary: Status post cholecystectomy. Complex postoperative fluid collection in the gallbladder fossa measuring approximately 4.5 x 5.4 x 5.2 cm (axial image 21 of series 19 and coronal  image 29 of series 11) which demonstrates heterogeneous T1 and T2 signal intensity but is generally T1 hypointense and T2 hyperintense with internal areas of slightly increased T1 hypointensity and decreased T2 intensity. This collection does not demonstrate internal enhancement, and is most  compatible with a biloma. No other suspicious hepatic lesions are noted. There multiple filling defects within the common bile duct, compatible with choledocholithiasis. Common bile duct is dilated measuring up to 10 mm in the porta hepatis. There is also some mild intrahepatic biliary ductal dilatation. Pancreas: No pancreatic mass. No pancreatic ductal dilatation noted on MRCP images. No pancreatic or peripancreatic fluid or inflammatory changes. Spleen:  Unremarkable. Adrenals/Urinary Tract: Bilateral kidneys and bilateral adrenal glands are normal in appearance. No hydroureteronephrosis in the visualized portions of the abdomen. Stomach/Bowel: Visualized portions are unremarkable. Vascular/Lymphatic: No aneurysm identified in the visualized abdominal vasculature. No lymphadenopathy noted in the abdomen. Other: No significant volume of ascites noted in the visualized portions of the peritoneal cavity. Musculoskeletal: No aggressive appearing osseous lesions are noted in the visualized portions of the skeleton. IMPRESSION: 1. Choledocholithiasis with mild intra and extrahepatic biliary ductal dilatation concerning for biliary tract obstruction. 2. Persistent postoperative fluid collection in the gallbladder fossa in this patient status post cholecystectomy, concerning for potential biloma. This could be further evaluated with HIDA scan if clinically appropriate. Electronically Signed   By: Vinnie Langton M.D.   On: 04/30/2018 17:54   Dg Ercp With Sphincterotomy  Result Date: 05/02/2018 CLINICAL DATA:  37 year old female with choledocholithiasis EXAM: ERCP TECHNIQUE: Multiple spot images obtained with the fluoroscopic device and submitted for interpretation post-procedure. FLUOROSCOPY TIME:  Fluoroscopy Time:  12 minutes 22 seconds reported Radiation Exposure Index (if provided by the fluoroscopic device): 230 mGy COMPARISON:  MRCP 04/30/2018 FINDINGS: A total of 22 saved images were obtained and submitted for  review. The images demonstrate a flexible endoscope in the descending duodenum with wire cannulation of the hepatic ducts followed by cholangiogram. There is moderate intra and extrahepatic biliary ductal dilatation. Multiple filling defects in the common bile duct are consistent with choledocholithiasis. A percutaneous drainage catheter is present in the right upper quadrant. There is evidence of extravasation of contrast material in the region of the surgical clips, likely at the cystic duct remanent. Subsequent images demonstrate sphincterotomy, balloon sweep of the common duct and ultimately placement of a plastic biliary stent. IMPRESSION: 1. Choledocholithiasis with moderate intra and extrahepatic biliary ductal dilatation. 2. Extravasation of injected contrast material in the gallbladder fossa, likely from the cystic duct remnant. 3. Sphincterotomy, balloon sweep of the common duct and placement of a plastic biliary stent. These images were submitted for radiologic interpretation only. Please see the procedural report for the amount of contrast and the fluoroscopy time utilized. Electronically Signed   By: Jacqulynn Cadet M.D.   On: 05/02/2018 11:05   Mr Abdomen Mrcp Moise Boring Contast  Result Date: 04/30/2018 CLINICAL DATA:  37 year old female with history of abnormal liver function tests. Cholelithiasis. EXAM: MRI ABDOMEN WITHOUT AND WITH CONTRAST (INCLUDING MRCP) TECHNIQUE: Multiplanar multisequence MR imaging of the abdomen was performed both before and after the administration of intravenous contrast. Heavily T2-weighted images of the biliary and pancreatic ducts were obtained, and three-dimensional MRCP images were rendered by post processing. CONTRAST:  45mL MULTIHANCE GADOBENATE DIMEGLUMINE 529 MG/ML IV SOLN COMPARISON:  No prior abdominal MRI. CT the abdomen and pelvis 04/28/2018. FINDINGS: Comment: Portions of the examination are limited by considerable patient respiratory motion. Lower chest:  Unremarkable. Hepatobiliary:  Status post cholecystectomy. Complex postoperative fluid collection in the gallbladder fossa measuring approximately 4.5 x 5.4 x 5.2 cm (axial image 21 of series 19 and coronal image 29 of series 11) which demonstrates heterogeneous T1 and T2 signal intensity but is generally T1 hypointense and T2 hyperintense with internal areas of slightly increased T1 hypointensity and decreased T2 intensity. This collection does not demonstrate internal enhancement, and is most compatible with a biloma. No other suspicious hepatic lesions are noted. There multiple filling defects within the common bile duct, compatible with choledocholithiasis. Common bile duct is dilated measuring up to 10 mm in the porta hepatis. There is also some mild intrahepatic biliary ductal dilatation. Pancreas: No pancreatic mass. No pancreatic ductal dilatation noted on MRCP images. No pancreatic or peripancreatic fluid or inflammatory changes. Spleen:  Unremarkable. Adrenals/Urinary Tract: Bilateral kidneys and bilateral adrenal glands are normal in appearance. No hydroureteronephrosis in the visualized portions of the abdomen. Stomach/Bowel: Visualized portions are unremarkable. Vascular/Lymphatic: No aneurysm identified in the visualized abdominal vasculature. No lymphadenopathy noted in the abdomen. Other: No significant volume of ascites noted in the visualized portions of the peritoneal cavity. Musculoskeletal: No aggressive appearing osseous lesions are noted in the visualized portions of the skeleton. IMPRESSION: 1. Choledocholithiasis with mild intra and extrahepatic biliary ductal dilatation concerning for biliary tract obstruction. 2. Persistent postoperative fluid collection in the gallbladder fossa in this patient status post cholecystectomy, concerning for potential biloma. This could be further evaluated with HIDA scan if clinically appropriate. Electronically Signed   By: Vinnie Langton M.D.   On:  04/30/2018 17:54   Ct Image Guided Drainage By Percutaneous Catheter  Result Date: 05/01/2018 INDICATION: Recent cholecystectomy, now with persistent fluid collection within the gallbladder fossa Please perform image guided percutaneous drainage catheter placement for infection source control purposes EXAM: ULTRASOUND AND CT GUIDED PERCUTANEOUS DRAINAGE CATHETER PLACEMENT INTO THE GALLBLADDER FOSSA COMPARISON:  CT abdomen and pelvis -0 04/28/2018; MRCP - 04/30/2018; nuclear medicine HIDA scan - 04/28/2018 MEDICATIONS: The patient is currently admitted to the hospital and receiving intravenous antibiotics. The antibiotics were administered within an appropriate time frame prior to the initiation of the procedure. ANESTHESIA/SEDATION: Moderate (conscious) sedation was employed during this procedure. A total of Versed 4 mg and Fentanyl 200 mcg was administered intravenously. Moderate Sedation Time: 20 minutes. The patient's level of consciousness and vital signs were monitored continuously by radiology nursing throughout the procedure under my direct supervision. CONTRAST:  None COMPLICATIONS: None immediate. PROCEDURE: Informed written consent was obtained from the patient after a discussion of the risks, benefits and alternatives to treatment. The patient was placed supine on the CT gantry and a pre procedural CT was performed re-demonstrating the known abscess/fluid collection within the gallbladder fossa with dominant component measuring approximately 6.0 x 4.9 cm (image 20, series 2). Sonographic evaluation was performed of the right upper abdominal quadrant adequately demonstrating the location of the gallbladder fossa fluid collection. The procedure was planned. A timeout was performed prior to the initiation of the procedure. The skin overlying the right upper abdomen was prepped and draped in the usual sterile fashion. The overlying soft tissues were anesthetized with 1% lidocaine with epinephrine. Under  direct ultrasound guidance, an 18 gauge trocar needle was advanced into the abscess/fluid collection and a short Amplatz super stiff wire was coiled within the collection. Appropriate positioning was confirmed with a limited CT scan. The tract was serially dilated allowing placement of a 10 [place] all-purpose drainage catheter. Appropriate positioning was confirmed with a limited  postprocedural CT scan. Approximately 60 cc of bilious appearing non foul smelling fluid was aspirated. The tube was connected to a drainage bag and sutured in place. A dressing was placed. The patient tolerated the procedure well without immediate post procedural complication. IMPRESSION: Successful ultrasound and CT guided placement of a 10 French all purpose drain catheter into the gallbladder fossa fluid collection with aspiration of 60 cc of bilious appearing, non foul smelling fluid. Samples were sent to the laboratory (including for the acquisition of bilirubin levels) as requested by the ordering clinical team. Electronically Signed   By: Sandi Mariscal M.D.   On: 05/01/2018 15:34    Labs:  CBC: Recent Labs    04/29/18 1113 04/30/18 0605 05/01/18 0530 05/02/18 0558  WBC 9.8 9.0 8.7 9.3  HGB 9.5* 9.0* 9.9* 9.4*  HCT 30.3* 28.6* 31.1* 29.7*  PLT 405* 354 368 315    COAGS: Recent Labs    04/01/18 1914 04/28/18 0636  INR 1.06 1.02  APTT 20*  --     BMP: Recent Labs    04/30/18 0605 05/01/18 0530 05/02/18 0558 05/03/18 0557  NA 137 136 135 135  K 3.4* 3.7 3.5 3.9  CL 103 104 102 100*  CO2 27 22 22 26   GLUCOSE 122* 183* 134* 297*  BUN 6 6 6 12   CALCIUM 8.1* 8.6* 8.4* 8.3*  CREATININE 0.72 0.70 0.66 1.08*  GFRNONAA >60 >60 >60 >60  GFRAA >60 >60 >60 >60    LIVER FUNCTION TESTS: Recent Labs    04/30/18 0605 05/01/18 0530 05/02/18 0558 05/03/18 0557  BILITOT 1.7* 1.7* 1.2 0.7  AST 364* 377* 187* 123*  ALT 495* 497* 384* 299*  ALKPHOS 368* 387* 362* 310*  PROT 6.6 7.0 7.1 6.6  ALBUMIN  2.7* 2.9* 2.8* 2.8*    Assessment and Plan: Pt s/p lap partial cholecystectomy 5/3; biloma drain placement 6/2; ERCP with stone retrieval,ventral panc duct/CBD stenting 6/3; afebrile; creat 1.08, t bili 0.7, other LFT's decreasing;  bile cx pend; monitor drain output and once minimal obtain f/u CT either as IP or at IR drain clinic if goes home soon      Electronically Signed: D. Rowe Robert, PA-C 05/03/2018, 10:47 AM   I spent a total of 15 minutes at the the patient's bedside AND on the patient's hospital floor or unit, greater than 50% of which was counseling/coordinating care for biloma drain    Patient ID: Cynthia Rosales, female   DOB: Apr 07, 1981, 37 y.o.   MRN: [id number]

## 2018-05-03 NOTE — Progress Notes (Addendum)
Progress Note   Subjective  Chief Complaint: Bile leak and choledocholithiasis  This morning patient reports feeling well, no further nausea, some discomfort around drain site but otherwise doing well.  Discusses that she had a baked potato from [facility] last night and a small piece of cake as it was her son's birthday.  Would like to be placed on a regular diet instead of carb smart.  Describes she has been diabetic since age of 28 and knows what to eat and not to eat.  She would just "like some meat".  Asked if she may be able to go home tomorrow as it is very middle sons graduation from elementary school.   Objective   Vital signs in last 24 hours: Temp:  [98.1 F (36.7 C)-98.5 F (36.9 C)] 98.2 F (36.8 C) (06/04 0601) Pulse Rate:  [84-123] 89 (06/04 0601) Resp:  [13-24] 18 (06/04 0730) BP: (128-189)/(88-137) 128/89 (06/04 0601) SpO2:  [97 %-100 %] 99 % (06/04 0730) Last BM Date: 04/27/18 General:    Obese AA female in NAD Heart:  Regular rate and rhythm; no murmurs Lungs: Respirations even and unlabored, lungs CTA bilaterally Abdomen:  Soft, mild RUQ ttp around biliary drain site, output small amt of green bile and nondistended. Normal bowel sounds. Extremities:  Without edema. Neurologic:  Alert and oriented,  grossly normal neurologically. Psych:  Cooperative. Normal mood and affect.  Intake/Output from previous day: 06/03 0701 - 06/04 0700 In: 1645.8 [I.V.:1235.8; IV Piggyback:400] Out: 2458 [Urine:1550; Drains:150; Blood:10]  Lab Results: Recent Labs    05/01/18 0530 05/02/18 0558  WBC 8.7 9.3  HGB 9.9* 9.4*  HCT 31.1* 29.7*  PLT 368 315   BMET Recent Labs    05/01/18 0530 05/02/18 0558 05/03/18 0557  NA 136 135 135  K 3.7 3.5 3.9  CL 104 102 100*  CO2 22 22 26   GLUCOSE 183* 134* 297*  BUN 6 6 12   CREATININE 0.70 0.66 1.08*  CALCIUM 8.6* 8.4* 8.3*   LFT Recent Labs    05/03/18 0557  PROT 6.6  ALBUMIN 2.8*  AST 123*  ALT 299*  ALKPHOS 310*    BILITOT 0.7   Studies/Results: Dg Ercp With Sphincterotomy  Result Date: 05/02/2018 CLINICAL DATA:  37 year old female with choledocholithiasis EXAM: ERCP TECHNIQUE: Multiple spot images obtained with the fluoroscopic device and submitted for interpretation post-procedure. FLUOROSCOPY TIME:  Fluoroscopy Time:  12 minutes 22 seconds reported Radiation Exposure Index (if provided by the fluoroscopic device): 230 mGy COMPARISON:  MRCP 04/30/2018 FINDINGS: A total of 22 saved images were obtained and submitted for review. The images demonstrate a flexible endoscope in the descending duodenum with wire cannulation of the hepatic ducts followed by cholangiogram. There is moderate intra and extrahepatic biliary ductal dilatation. Multiple filling defects in the common bile duct are consistent with choledocholithiasis. A percutaneous drainage catheter is present in the right upper quadrant. There is evidence of extravasation of contrast material in the region of the surgical clips, likely at the cystic duct remanent. Subsequent images demonstrate sphincterotomy, balloon sweep of the common duct and ultimately placement of a plastic biliary stent. IMPRESSION: 1. Choledocholithiasis with moderate intra and extrahepatic biliary ductal dilatation. 2. Extravasation of injected contrast material in the gallbladder fossa, likely from the cystic duct remnant. 3. Sphincterotomy, balloon sweep of the common duct and placement of a plastic biliary stent. These images were submitted for radiologic interpretation only. Please see the procedural report for the amount of contrast and the fluoroscopy  time utilized. Electronically Signed   By: Jacqulynn Cadet M.D.   On: 05/02/2018 11:05   Ct Image Guided Drainage By Percutaneous Catheter  Result Date: 05/01/2018 INDICATION: Recent cholecystectomy, now with persistent fluid collection within the gallbladder fossa Please perform image guided percutaneous drainage catheter placement  for infection source control purposes EXAM: ULTRASOUND AND CT GUIDED PERCUTANEOUS DRAINAGE CATHETER PLACEMENT INTO THE GALLBLADDER FOSSA COMPARISON:  CT abdomen and pelvis -0 04/28/2018; MRCP - 04/30/2018; nuclear medicine HIDA scan - 04/28/2018 MEDICATIONS: The patient is currently admitted to the hospital and receiving intravenous antibiotics. The antibiotics were administered within an appropriate time frame prior to the initiation of the procedure. ANESTHESIA/SEDATION: Moderate (conscious) sedation was employed during this procedure. A total of Versed 4 mg and Fentanyl 200 mcg was administered intravenously. Moderate Sedation Time: 20 minutes. The patient's level of consciousness and vital signs were monitored continuously by radiology nursing throughout the procedure under my direct supervision. CONTRAST:  None COMPLICATIONS: None immediate. PROCEDURE: Informed written consent was obtained from the patient after a discussion of the risks, benefits and alternatives to treatment. The patient was placed supine on the CT gantry and a pre procedural CT was performed re-demonstrating the known abscess/fluid collection within the gallbladder fossa with dominant component measuring approximately 6.0 x 4.9 cm (image 20, series 2). Sonographic evaluation was performed of the right upper abdominal quadrant adequately demonstrating the location of the gallbladder fossa fluid collection. The procedure was planned. A timeout was performed prior to the initiation of the procedure. The skin overlying the right upper abdomen was prepped and draped in the usual sterile fashion. The overlying soft tissues were anesthetized with 1% lidocaine with epinephrine. Under direct ultrasound guidance, an 18 gauge trocar needle was advanced into the abscess/fluid collection and a short Amplatz super stiff wire was coiled within the collection. Appropriate positioning was confirmed with a limited CT scan. The tract was serially dilated allowing  placement of a 10 [place] all-purpose drainage catheter. Appropriate positioning was confirmed with a limited postprocedural CT scan. Approximately 60 cc of bilious appearing non foul smelling fluid was aspirated. The tube was connected to a drainage bag and sutured in place. A dressing was placed. The patient tolerated the procedure well without immediate post procedural complication. IMPRESSION: Successful ultrasound and CT guided placement of a 10 French all purpose drain catheter into the gallbladder fossa fluid collection with aspiration of 60 cc of bilious appearing, non foul smelling fluid. Samples were sent to the laboratory (including for the acquisition of bilirubin levels) as requested by the ordering clinical team. Electronically Signed   By: Sandi Mariscal M.D.   On: 05/01/2018 15:34       Assessment / Plan:    Assessment: 1.  Elevated LFTs: Trending down 2.  Nausea and vomiting: No further symptoms 3.  Choledocholithiasis and bile leak: Status post ERCP 05/02/2018, with report as above 4. Bile leak Plan: 1.  Changed patient to a regular diet today per her request.  She has been diabetic since age of 33 and seems aware of what foods she needs to eat and not eat. 2.  Continue other supportive measures 3.  Please await any further recommendations from Dr. Loletha Carrow later today.  Thank you for your kind consultation.   LOS: 5 days   Levin Erp  05/03/2018, 9:41 AM  Pager # [phone]  I have discussed the case with the PA, and that is the plan I formulated. I personally interviewed and examined the patient.  She is improved today, less pain and improved LFTs. C/o pain at drain site.  I explained the plan to her and the hospital physician.  KUB in 10 days to see if stent passes on its own.  If not, needs EGD with Dr. Henrene Pastor to remove.  Either way, then needs repeat ERCP and stent removal  With Dr. Henrene Pastor in about 6 weeks.  I discussed the case with him earlier today.  Taheera  can be discharged any time from a GI perspective.  Needs close office follow up with surgery to determine timing of external drain removal.    Nelida Meuse III Office: [phone]

## 2018-05-04 ENCOUNTER — Other Ambulatory Visit: Payer: Self-pay | Admitting: Internal Medicine

## 2018-05-04 LAB — CBC WITH DIFFERENTIAL/PLATELET
Basophils Absolute: 0 10*3/uL (ref 0.0–0.1)
Basophils Relative: 0 %
Eosinophils Absolute: 0.4 10*3/uL (ref 0.0–0.7)
Eosinophils Relative: 4 %
HCT: 27.7 % — ABNORMAL LOW (ref 36.0–46.0)
Hemoglobin: 8.6 g/dL — ABNORMAL LOW (ref 12.0–15.0)
Lymphocytes Relative: 43 %
Lymphs Abs: 4.5 10*3/uL — ABNORMAL HIGH (ref 0.7–4.0)
MCH: 21.7 pg — ABNORMAL LOW (ref 26.0–34.0)
MCHC: 31 g/dL (ref 30.0–36.0)
MCV: 69.9 fL — ABNORMAL LOW (ref 78.0–100.0)
Monocytes Absolute: 0.5 10*3/uL (ref 0.1–1.0)
Monocytes Relative: 5 %
Neutro Abs: 5.1 10*3/uL (ref 1.7–7.7)
Neutrophils Relative %: 48 %
Platelets: 351 10*3/uL (ref 150–400)
RBC: 3.96 MIL/uL (ref 3.87–5.11)
RDW: 19 % — ABNORMAL HIGH (ref 11.5–15.5)
WBC: 10.5 10*3/uL (ref 4.0–10.5)

## 2018-05-04 LAB — COMPREHENSIVE METABOLIC PANEL
ALT: 232 U/L — ABNORMAL HIGH (ref 14–54)
AST: 72 U/L — ABNORMAL HIGH (ref 15–41)
Albumin: 2.8 g/dL — ABNORMAL LOW (ref 3.5–5.0)
Alkaline Phosphatase: 279 U/L — ABNORMAL HIGH (ref 38–126)
Anion gap: 9 (ref 5–15)
BUN: 14 mg/dL (ref 6–20)
CO2: 28 mmol/L (ref 22–32)
Calcium: 8.5 mg/dL — ABNORMAL LOW (ref 8.9–10.3)
Chloride: 100 mmol/L — ABNORMAL LOW (ref 101–111)
Creatinine, Ser: 0.86 mg/dL (ref 0.44–1.00)
GFR calc Af Amer: >60 mL/min (ref >60)
GFR calc non Af Amer: >60 mL/min (ref >60)
Glucose, Bld: 224 mg/dL — ABNORMAL HIGH (ref 65–99)
Potassium: 3.9 mmol/L (ref 3.5–5.1)
Sodium: 137 mmol/L (ref 135–145)
Total Bilirubin: 0.8 mg/dL (ref 0.3–1.2)
Total Protein: 6.9 g/dL (ref 6.5–8.1)

## 2018-05-04 LAB — GLUCOSE, CAPILLARY
Glucose-Capillary: 189 mg/dL — ABNORMAL HIGH (ref 65–99)
Glucose-Capillary: 243 mg/dL — ABNORMAL HIGH (ref 65–99)
Glucose-Capillary: 254 mg/dL — ABNORMAL HIGH (ref 65–99)
Glucose-Capillary: 288 mg/dL — ABNORMAL HIGH (ref 65–99)

## 2018-05-04 MED ORDER — AMOXICILLIN-POT CLAVULANATE 875-125 MG PO TABS: 1.0000 | ORAL_TABLET | Freq: Two times a day (BID) | ORAL | 0 refills | Status: AC

## 2018-05-04 MED ORDER — OXYCODONE HCL 30 MG PO TABS: 30.0000 mg | ORAL_TABLET | Freq: Four times a day (QID) | ORAL | 0 refills | Status: DC | PRN

## 2018-05-04 MED ORDER — ZOLPIDEM TARTRATE 5 MG PO TABS
5.0000 mg | ORAL_TABLET | Freq: Every evening | ORAL | Status: DC | PRN
  Administered 2018-05-04: 5 mg via ORAL
  Filled 2018-05-04: qty 1

## 2018-05-04 MED ORDER — IBUPROFEN 400 MG PO TABS: 800.0000 mg | ORAL_TABLET | Freq: Four times a day (QID) | ORAL | 0 refills | Status: DC | PRN

## 2018-05-04 NOTE — Progress Notes (Signed)
Inpatient Diabetes Program Recommendations  AACE/ADA: New Consensus Statement on Inpatient Glycemic Control (2015)  Target Ranges:  Prepandial:   less than 140 mg/dL      Peak postprandial:   less than 180 mg/dL (1-2 hours)      Critically ill patients:  140 - 180 mg/dL   Lab Results  Component Value Date   GLUCAP 254 (H) 05/04/2018   HGBA1C 9.3 (H) 03/27/2014    Review of Glycemic Control  Blood sugars in 200s. Needs insulin adjustment.  Inpatient Diabetes Program Recommendations:     Increase Lantus to 20 units bid Increase Novolog to 0-15 units Q4H.  Will continue to follow.  Thank you. Lorenda Peck, RD, LDN, CDE Inpatient Diabetes Coordinator [phone]

## 2018-05-04 NOTE — Discharge Instructions (Signed)
Cholelithiasis Cholelithiasis is also called "gallstones." It is a kind of gallbladder disease. The gallbladder is an organ that stores a liquid (bile) that helps you digest fat. Gallstones may not cause symptoms (may be silent gallstones) until they cause a blockage, and then they can cause pain (gallbladder attack). Follow these instructions at home:  Take over-the-counter and prescription medicines only as told by your doctor.  Stay at a healthy weight.  Eat healthy foods. This includes: ? Eating fewer fatty foods, like fried foods. ? Eating fewer refined carbs (refined carbohydrates). Refined carbs are breads and grains that are highly processed, like white bread and white rice. Instead, choose whole grains like whole-wheat bread and brown rice. ? Eating more fiber. Almonds, fresh fruit, and beans are healthy sources of fiber.  Keep all follow-up visits as told by your doctor. This is important. Contact a doctor if:  You have sudden pain in the upper right side of your belly (abdomen). Pain might spread to your right shoulder or your chest. This may be a sign of a gallbladder attack.  You feel sick to your stomach (are nauseous).  You throw up (vomit).  You have been diagnosed with gallstones that have no symptoms and you get: ? Belly pain. ? Discomfort, burning, or fullness in the upper part of your belly (indigestion). Get help right away if:  You have sudden pain in the upper right side of your belly, and it lasts for more than 2 hours.  You have belly pain that lasts for more than 5 hours.  You have a fever or chills.  You keep feeling sick to your stomach or you keep throwing up.  Your skin or the whites of your eyes turn yellow (jaundice).  You have dark-colored pee (urine).  You have light-colored poop (stool). Summary  Cholelithiasis is also called "gallstones."  The gallbladder is an organ that stores a liquid (bile) that helps you digest fat.  Silent  gallstones are gallstones that do not cause symptoms.  A gallbladder attack may cause sudden pain in the upper right side of your belly. Pain might spread to your right shoulder or your chest. If this happens, contact your doctor.  If you have sudden pain in the upper right side of your belly that lasts for more than 2 hours, get help right away. This information is not intended to replace advice given to you by your health care provider. Make sure you discuss any questions you have with your health care provider. Document Released: 05/04/2008 Document Revised: 08/02/2016 Document Reviewed: 08/02/2016 Elsevier Interactive Patient Education  2017 [employer] Sickle Cell Anemia, Adult Sickle cell anemia is a condition in which red blood cells have an abnormal sickle shape. This abnormal shape shortens the cells life span, which results in a lower than normal concentration of red blood cells in the blood. The sickle shape also causes the cells to clump together and block free blood flow through the blood vessels. As a result, the tissues and organs of the body do not receive enough oxygen. Sickle cell anemia causes organ damage and pain and increases the risk of infection. What are the causes? Sickle cell anemia is a genetic disorder. Those who receive two copies of the gene have the condition, and those who receive one copy have the trait. What increases the risk? The sickle cell gene is most common in people whose families originated in [place]. Other areas of the globe where sickle cell trait occurs include the [place],  [place], and the [place]. What are the signs or symptoms?  Pain, especially in the extremities, back, chest, or abdomen (common). The pain may start suddenly or may develop following an illness, especially if there is dehydration. Pain can also occur due to overexertion or exposure to extreme temperature changes.  Frequent severe  bacterial infections, especially certain types of pneumonia and meningitis.  Pain and swelling in the hands and feet.  Decreased activity.  Loss of appetite.  Change in behavior.  Headaches.  Seizures.  Shortness of breath or difficulty breathing.  Vision changes.  Skin ulcers. Those with the trait may not have symptoms or they may have mild symptoms. How is this diagnosed? Sickle cell anemia is diagnosed with blood tests that demonstrate the genetic trait. It is often diagnosed during the newborn period, due to mandatory testing nationwide. A variety of blood tests, X-rays, CT scans, MRI scans, ultrasounds, and lung function tests may also be done to monitor the condition. How is this treated? Sickle cell anemia may be treated with:  Medicines. You may be given pain medicines, antibiotic medicines (to treat and prevent infections) or medicines to increase the production of certain types of hemoglobin.  Fluids.  Oxygen.  Blood transfusions.  Follow these instructions at home:  Drink enough fluid to keep your urine clear or pale yellow. Increase your fluid intake in hot weather and during exercise.  Do not smoke. Smoking lowers oxygen levels in the blood.  Only take over-the-counter or prescription medicines for pain, fever, or discomfort as directed by your health care provider.  Take antibiotics as directed by your health care provider. Make sure you finish them it even if you start to feel better.  Take supplements as directed by your health care provider.  Consider wearing a medical alert bracelet. This tells anyone caring for you in an emergency of your condition.  When traveling, keep your medical information, health care provider's names, and the medicines you take with you at all times.  If you develop a fever, do not take medicines to reduce the fever right away. This could cover up a problem that is developing. Notify your health care provider.  Keep all  follow-up appointments with your health care provider. Sickle cell anemia requires regular medical care. Contact a health care provider if: You have a fever. Get help right away if:  You feel dizzy or faint.  You have new abdominal pain, especially on the left side near the stomach area.  You develop a persistent, often uncomfortable and painful penile erection (priapism). If this is not treated immediately it will lead to impotence.  You have numbness your arms or legs or you have a hard time moving them.  You have a hard time with speech.  You have a fever or persistent symptoms for more than 2-3 days.  You have a fever and your symptoms suddenly get worse.  You have signs or symptoms of infection. These include: ? Chills. ? Abnormal tiredness (lethargy). ? Irritability. ? Poor eating. ? Vomiting.  You develop pain that is not helped with medicine.  You develop shortness of breath.  You have pain in your chest.  You are coughing up pus-like or bloody sputum.  You develop a stiff neck.  Your feet or hands swell or have pain.  Your abdomen appears bloated.  You develop joint pain. This information is not intended to replace advice given to you by your health care provider. Make  sure you discuss any questions you have with your health care provider. Document Released: 02/24/2006 Document Revised: 06/05/2016 Document Reviewed: 06/28/2013 Elsevier Interactive Patient Education  2017 [employer]. Diabetes Mellitus and Exercise Exercising regularly is important for your overall health, especially when you have diabetes (diabetes mellitus). Exercising is not only about losing weight. It has many health benefits, such as increasing muscle strength and bone density and reducing body fat and stress. This leads to improved fitness, flexibility, and endurance, all of which result in better overall health. Exercise has additional benefits for people with diabetes,  including:  Reducing appetite.  Helping to lower and control blood glucose.  Lowering blood pressure.  Helping to control amounts of fatty substances (lipids) in the blood, such as cholesterol and triglycerides.  Helping the body to respond better to insulin (improving insulin sensitivity).  Reducing how much insulin the body needs.  Decreasing the risk for heart disease by: ? Lowering cholesterol and triglyceride levels. ? Increasing the levels of good cholesterol. ? Lowering blood glucose levels.  What is my activity plan? Your health care provider or certified diabetes educator can help you make a plan for the type and frequency of exercise (activity plan) that works for you. Make sure that you:  Do at least 150 minutes of moderate-intensity or vigorous-intensity exercise each week. This could be brisk walking, biking, or water aerobics. ? Do stretching and strength exercises, such as yoga or weightlifting, at least 2 times a week. ? Spread out your activity over at least 3 days of the week.  Get some form of physical activity every day. ? Do not go more than 2 days in a row without some kind of physical activity. ? Avoid being inactive for more than 90 minutes at a time. Take frequent breaks to walk or stretch.  Choose a type of exercise or activity that you enjoy, and set realistic goals.  Start slowly, and gradually increase the intensity of your exercise over time.  What do I need to know about managing my diabetes?  Check your blood glucose before and after exercising. ? If your blood glucose is higher than 240 mg/dL (13.3 mmol/L) before you exercise, check your urine for ketones. If you have ketones in your urine, do not exercise until your blood glucose returns to normal.  Know the symptoms of low blood glucose (hypoglycemia) and how to treat it. Your risk for hypoglycemia increases during and after exercise. Common symptoms of hypoglycemia can  include: ? Hunger. ? Anxiety. ? Sweating and feeling clammy. ? Confusion. ? Dizziness or feeling light-headed. ? Increased heart rate or palpitations. ? Blurry vision. ? Tingling or numbness around the mouth, lips, or tongue. ? Tremors or shakes. ? Irritability.  Keep a rapid-acting carbohydrate snack available before, during, and after exercise to help prevent or treat hypoglycemia.  Avoid injecting insulin into areas of the body that are going to be exercised. For example, avoid injecting insulin into: ? The arms, when playing tennis. ? The legs, when jogging.  Keep records of your exercise habits. Doing this can help you and your health care provider adjust your diabetes management plan as needed. Write down: ? Food that you eat before and after you exercise. ? Blood glucose levels before and after you exercise. ? The type and amount of exercise you have done. ? When your insulin is expected to peak, if you use insulin. Avoid exercising at times when your insulin is peaking.  When you start a new  exercise or activity, work with your health care provider to make sure the activity is safe for you, and to adjust your insulin, medicines, or food intake as needed.  Drink plenty of water while you exercise to prevent dehydration or heat stroke. Drink enough fluid to keep your urine clear or pale yellow. This information is not intended to replace advice given to you by your health care provider. Make sure you discuss any questions you have with your health care provider. Document Released: 02/06/2004 Document Revised: 06/05/2016 Document Reviewed: 04/27/2016 Elsevier Interactive Patient Education  2018 [employer].

## 2018-05-04 NOTE — Progress Notes (Signed)
Went over discharge instructions, drain care and flushing with [employer]back.  Patient verbalized understanding.  Left hospital via w/c with all belongings. Virginia Rochester, RN

## 2018-05-04 NOTE — Progress Notes (Signed)
Referring Physician(s): Gross,S  Supervising Physician: Daryll Brod  Patient Status:  [facility] - In-pt  Chief Complaint:  Abdominal pain, bile leak    Subjective: Pt doing well; no new c/o ; anxious to go home for a graduation   Allergies: Amoxicillin; Buprenorphine hcl; and Morphine and related  Medications: Prior to Admission medications   Medication Sig Start Date End Date Taking? Authorizing Provider  AMITIZA 24 MCG capsule Take 24 mcg by mouth daily with breakfast.  04/27/18  Yes [provider]  hydrochlorothiazide (HYDRODIURIL) 25 MG tablet Take 25 mg by mouth daily.  04/27/18  Yes [provider]  insulin NPH-regular Human (NOVOLIN 70/30) (70-30) 100 UNIT/ML injection Inject 35-36 Units into the skin See admin instructions. Inject 35 units subcutaneously after breakfast and 36 units after supper   Yes [provider]  metFORMIN (GLUCOPHAGE) 1000 MG tablet Take 1,000 mg by mouth daily with breakfast.  04/27/18  Yes [provider]  ondansetron (ZOFRAN ODT) 4 MG disintegrating tablet Take 1 tablet (4 mg total) by mouth every 8 (eight) hours as needed for nausea or vomiting. 04/19/18  Yes Lorin Glass, PA-C  oxycodone (ROXICODONE) 30 MG immediate release tablet Take 30 mg by mouth every 6 (six) hours as needed for pain.   Yes [provider]  sitaGLIPtin (JANUVIA) 100 MG tablet Take 100 mg by mouth daily.   Yes [provider]  albuterol (PROVENTIL HFA;VENTOLIN HFA) 108 (90 Base) MCG/ACT inhaler Inhale 2 puffs into the lungs every 6 (six) hours as needed for wheezing or shortness of breath.    [provider]  diazepam (VALIUM) 5 MG tablet take 1 tablet BY MOUTH THREE TIMES DAILY AS NEEDED Patient taking differently: Take 5 mg by mouth every 8 (eight) hours as needed for anxiety (pain). take 1 tablet BY MOUTH THREE TIMES DAILY AS NEEDED 04/27/18   Melvenia Beam, MD  hydrochlorothiazide (HYDRODIURIL) 12.5 MG  tablet Take 1 tablet (12.5 mg total) by mouth daily. Patient not taking: Reported on 04/27/2018 04/06/18   Roxan Hockey, MD  Prenat w/o A Vit-FeFum-FePo-FA (CONCEPT OB) 130-92.4-1 MG CAPS Take 1 capsule by mouth daily. Patient not taking: Reported on 11/15/2017 08/09/17   [name], MD     Vital Signs: BP 101/87 (BP Location: Right Wrist)   Pulse (!) 102   Temp 98.4 F (36.9 C) (Oral)   Resp 17   Ht 5\' 4"  (1.626 m)   Wt (!) 310 lb (140.6 kg)   LMP 05/02/2018 Comment: negative beta CHG 04/27/17  SpO2 100%   BMI 53.21 kg/m   Physical Exam awake/alert; RUQ drain intact, insertion site ok, mildly tender, diminishing output  Imaging: Mr 3d Recon At Scanner  Result Date: 04/30/2018 CLINICAL DATA:  37 year old female with history of abnormal liver function tests. Cholelithiasis. EXAM: MRI ABDOMEN WITHOUT AND WITH CONTRAST (INCLUDING MRCP) TECHNIQUE: Multiplanar multisequence MR imaging of the abdomen was performed both before and after the administration of intravenous contrast. Heavily T2-weighted images of the biliary and pancreatic ducts were obtained, and three-dimensional MRCP images were rendered by post processing. CONTRAST:  44mL MULTIHANCE GADOBENATE DIMEGLUMINE 529 MG/ML IV SOLN COMPARISON:  No prior abdominal MRI. CT the abdomen and pelvis 04/28/2018. FINDINGS: Comment: Portions of the examination are limited by considerable patient respiratory motion. Lower chest: Unremarkable. Hepatobiliary: Status post cholecystectomy. Complex postoperative fluid collection in the gallbladder fossa measuring approximately 4.5 x 5.4 x 5.2 cm (axial image 21 of series 19 and coronal image  29 of series 11) which demonstrates heterogeneous T1 and T2 signal intensity but is generally T1 hypointense and T2 hyperintense with internal areas of slightly increased T1 hypointensity and decreased T2 intensity. This collection does not demonstrate internal enhancement, and is most compatible with a biloma. No  other suspicious hepatic lesions are noted. There multiple filling defects within the common bile duct, compatible with choledocholithiasis. Common bile duct is dilated measuring up to 10 mm in the porta hepatis. There is also some mild intrahepatic biliary ductal dilatation. Pancreas: No pancreatic mass. No pancreatic ductal dilatation noted on MRCP images. No pancreatic or peripancreatic fluid or inflammatory changes. Spleen:  Unremarkable. Adrenals/Urinary Tract: Bilateral kidneys and bilateral adrenal glands are normal in appearance. No hydroureteronephrosis in the visualized portions of the abdomen. Stomach/Bowel: Visualized portions are unremarkable. Vascular/Lymphatic: No aneurysm identified in the visualized abdominal vasculature. No lymphadenopathy noted in the abdomen. Other: No significant volume of ascites noted in the visualized portions of the peritoneal cavity. Musculoskeletal: No aggressive appearing osseous lesions are noted in the visualized portions of the skeleton. IMPRESSION: 1. Choledocholithiasis with mild intra and extrahepatic biliary ductal dilatation concerning for biliary tract obstruction. 2. Persistent postoperative fluid collection in the gallbladder fossa in this patient status post cholecystectomy, concerning for potential biloma. This could be further evaluated with HIDA scan if clinically appropriate. Electronically Signed   By: Vinnie Langton M.D.   On: 04/30/2018 17:54   Dg Ercp With Sphincterotomy  Result Date: 05/02/2018 CLINICAL DATA:  37 year old female with choledocholithiasis EXAM: ERCP TECHNIQUE: Multiple spot images obtained with the fluoroscopic device and submitted for interpretation post-procedure. FLUOROSCOPY TIME:  Fluoroscopy Time:  12 minutes 22 seconds reported Radiation Exposure Index (if provided by the fluoroscopic device): 230 mGy COMPARISON:  MRCP 04/30/2018 FINDINGS: A total of 22 saved images were obtained and submitted for review. The images  demonstrate a flexible endoscope in the descending duodenum with wire cannulation of the hepatic ducts followed by cholangiogram. There is moderate intra and extrahepatic biliary ductal dilatation. Multiple filling defects in the common bile duct are consistent with choledocholithiasis. A percutaneous drainage catheter is present in the right upper quadrant. There is evidence of extravasation of contrast material in the region of the surgical clips, likely at the cystic duct remanent. Subsequent images demonstrate sphincterotomy, balloon sweep of the common duct and ultimately placement of a plastic biliary stent. IMPRESSION: 1. Choledocholithiasis with moderate intra and extrahepatic biliary ductal dilatation. 2. Extravasation of injected contrast material in the gallbladder fossa, likely from the cystic duct remnant. 3. Sphincterotomy, balloon sweep of the common duct and placement of a plastic biliary stent. These images were submitted for radiologic interpretation only. Please see the procedural report for the amount of contrast and the fluoroscopy time utilized. Electronically Signed   By: Jacqulynn Cadet M.D.   On: 05/02/2018 11:05   Mr Abdomen Mrcp Moise Boring Contast  Result Date: 04/30/2018 CLINICAL DATA:  37 year old female with history of abnormal liver function tests. Cholelithiasis. EXAM: MRI ABDOMEN WITHOUT AND WITH CONTRAST (INCLUDING MRCP) TECHNIQUE: Multiplanar multisequence MR imaging of the abdomen was performed both before and after the administration of intravenous contrast. Heavily T2-weighted images of the biliary and pancreatic ducts were obtained, and three-dimensional MRCP images were rendered by post processing. CONTRAST:  54mL MULTIHANCE GADOBENATE DIMEGLUMINE 529 MG/ML IV SOLN COMPARISON:  No prior abdominal MRI. CT the abdomen and pelvis 04/28/2018. FINDINGS: Comment: Portions of the examination are limited by considerable patient respiratory motion. Lower chest: Unremarkable.  Hepatobiliary: Status  post cholecystectomy. Complex postoperative fluid collection in the gallbladder fossa measuring approximately 4.5 x 5.4 x 5.2 cm (axial image 21 of series 19 and coronal image 29 of series 11) which demonstrates heterogeneous T1 and T2 signal intensity but is generally T1 hypointense and T2 hyperintense with internal areas of slightly increased T1 hypointensity and decreased T2 intensity. This collection does not demonstrate internal enhancement, and is most compatible with a biloma. No other suspicious hepatic lesions are noted. There multiple filling defects within the common bile duct, compatible with choledocholithiasis. Common bile duct is dilated measuring up to 10 mm in the porta hepatis. There is also some mild intrahepatic biliary ductal dilatation. Pancreas: No pancreatic mass. No pancreatic ductal dilatation noted on MRCP images. No pancreatic or peripancreatic fluid or inflammatory changes. Spleen:  Unremarkable. Adrenals/Urinary Tract: Bilateral kidneys and bilateral adrenal glands are normal in appearance. No hydroureteronephrosis in the visualized portions of the abdomen. Stomach/Bowel: Visualized portions are unremarkable. Vascular/Lymphatic: No aneurysm identified in the visualized abdominal vasculature. No lymphadenopathy noted in the abdomen. Other: No significant volume of ascites noted in the visualized portions of the peritoneal cavity. Musculoskeletal: No aggressive appearing osseous lesions are noted in the visualized portions of the skeleton. IMPRESSION: 1. Choledocholithiasis with mild intra and extrahepatic biliary ductal dilatation concerning for biliary tract obstruction. 2. Persistent postoperative fluid collection in the gallbladder fossa in this patient status post cholecystectomy, concerning for potential biloma. This could be further evaluated with HIDA scan if clinically appropriate. Electronically Signed   By: Vinnie Langton M.D.   On: 04/30/2018 17:54    Ct Image Guided Drainage By Percutaneous Catheter  Result Date: 05/01/2018 INDICATION: Recent cholecystectomy, now with persistent fluid collection within the gallbladder fossa Please perform image guided percutaneous drainage catheter placement for infection source control purposes EXAM: ULTRASOUND AND CT GUIDED PERCUTANEOUS DRAINAGE CATHETER PLACEMENT INTO THE GALLBLADDER FOSSA COMPARISON:  CT abdomen and pelvis -0 04/28/2018; MRCP - 04/30/2018; nuclear medicine HIDA scan - 04/28/2018 MEDICATIONS: The patient is currently admitted to the hospital and receiving intravenous antibiotics. The antibiotics were administered within an appropriate time frame prior to the initiation of the procedure. ANESTHESIA/SEDATION: Moderate (conscious) sedation was employed during this procedure. A total of Versed 4 mg and Fentanyl 200 mcg was administered intravenously. Moderate Sedation Time: 20 minutes. The patient's level of consciousness and vital signs were monitored continuously by radiology nursing throughout the procedure under my direct supervision. CONTRAST:  None COMPLICATIONS: None immediate. PROCEDURE: Informed written consent was obtained from the patient after a discussion of the risks, benefits and alternatives to treatment. The patient was placed supine on the CT gantry and a pre procedural CT was performed re-demonstrating the known abscess/fluid collection within the gallbladder fossa with dominant component measuring approximately 6.0 x 4.9 cm (image 20, series 2). Sonographic evaluation was performed of the right upper abdominal quadrant adequately demonstrating the location of the gallbladder fossa fluid collection. The procedure was planned. A timeout was performed prior to the initiation of the procedure. The skin overlying the right upper abdomen was prepped and draped in the usual sterile fashion. The overlying soft tissues were anesthetized with 1% lidocaine with epinephrine. Under direct ultrasound  guidance, an 18 gauge trocar needle was advanced into the abscess/fluid collection and a short Amplatz super stiff wire was coiled within the collection. Appropriate positioning was confirmed with a limited CT scan. The tract was serially dilated allowing placement of a 10 [place] all-purpose drainage catheter. Appropriate positioning was confirmed with a limited postprocedural  CT scan. Approximately 60 cc of bilious appearing non foul smelling fluid was aspirated. The tube was connected to a drainage bag and sutured in place. A dressing was placed. The patient tolerated the procedure well without immediate post procedural complication. IMPRESSION: Successful ultrasound and CT guided placement of a 10 [place] all purpose drain catheter into the gallbladder fossa fluid collection with aspiration of 60 cc of bilious appearing, non foul smelling fluid. Samples were sent to the laboratory (including for the acquisition of bilirubin levels) as requested by the ordering clinical team. Electronically Signed   By: Sandi Mariscal M.D.   On: 05/01/2018 15:34    Labs:  CBC: Recent Labs    04/30/18 0605 05/01/18 0530 05/02/18 0558 05/04/18 0552  WBC 9.0 8.7 9.3 10.5  HGB 9.0* 9.9* 9.4* 8.6*  HCT 28.6* 31.1* 29.7* 27.7*  PLT 354 368 315 351    COAGS: Recent Labs    04/01/18 1914 04/28/18 0636  INR 1.06 1.02  APTT 20*  --     BMP: Recent Labs    05/01/18 0530 05/02/18 0558 05/03/18 0557 05/04/18 0552  NA 136 135 135 137  K 3.7 3.5 3.9 3.9  CL 104 102 100* 100*  CO2 22 22 26 28   GLUCOSE 183* 134* 297* 224*  BUN 6 6 12 14   CALCIUM 8.6* 8.4* 8.3* 8.5*  CREATININE 0.70 0.66 1.08* 0.86  GFRNONAA >60 >60 >60 >60  GFRAA >60 >60 >60 >60    LIVER FUNCTION TESTS: Recent Labs    05/01/18 0530 05/02/18 0558 05/03/18 0557 05/04/18 0552  BILITOT 1.7* 1.2 0.7 0.8  AST 377* 187* 123* 72*  ALT 497* 384* 299* 232*  ALKPHOS 387* 362* 310* 279*  PROT 7.0 7.1 6.6 6.9  ALBUMIN 2.9* 2.8* 2.8* 2.8*     Assessment and Plan: Pt s/p lap partial cholecystectomy 5/3; biloma drain placement 6/2; ERCP with stone retrieval,ventral panc duct/CBD stenting 6/3; afebrile; WBC nl; hgb 8.6(9.4), creat nl; t bili 0.8, other LFT's trending down; bile cx neg to date; as OP recommend once daily irrigation of drain with 5 cc sterile normal saline, output recording and dressing changes every 1 to 2 days.  We will set patient up for follow-up CT in IR drain clinic within the next week.     Electronically Signed: D. Rowe Robert, PA-C 05/04/2018, 10:19 AM   I spent a total of 15 minutes at the the patient's bedside AND on the patient's hospital floor or unit, greater than 50% of which was counseling/coordinating care for gallbladder fossa drain    Patient ID: Cynthia Rosales, female   DOB: Sep 26, 1981, 37 y.o.   MRN: [id number]

## 2018-05-04 NOTE — Discharge Summary (Signed)
Physician Discharge Summary  Cynthia Rosales VQQ:[id number] DOB: [birth year] DOA: 04/27/2018  PCP: [facility]  Admit date: 04/27/2018  Discharge date: 05/04/2018  Discharge Diagnoses:  Principal Problem:   Abdominal pain Active Problems:   HTN (hypertension), benign   Diabetes mellitus type 2 in obese ([place])   Acute cholecystitis s/p lap cholecystectomy 04/03/2018   Morbid obesity with body mass index (BMI) of 50.0 to 59.9 in adult [facility] [place])   Transaminitis   Anemia   Choledocholithiasis   Intra-abdominal fluid collection   Bile leak, postoperative  Discharge Condition: Stable  Disposition:  Follow-up Information    Rolm Bookbinder, MD. Go on 05/11/2018.   Specialty:  General Surgery Why:  Your appointment is 05/11/18 at 4:10PM. Please arrive 30 minutes early to check in and fill out paperwork. Bring photo ID and insurance information. Contact information: [address] [place] [address] [phone]        Kerin Perna, NP. Go in 6 day(s).   Specialty:  Internal Medicine Why:  Appointment made with Juluis Mire for Tuesday 05/10/2018 at 10 am Contact information: [place] [address] [phone]        [facility]. Call.   Specialty:  Gastroenterology Contact information: [address] [place] [address] [phone]         Pt is discharged home in good condition and is to follow up with [facility] this week to have labs evaluated. Daris Aristizabal is instructed to increase activity slowly and balance with rest for the next few days, and use prescribed medication to complete treatment of pain  Diet: Regular Wt Readings from Last 3 Encounters:  04/27/18 (!) 140.6 kg (310 lb)  04/18/18 (!) 140.6 kg (310 lb)  04/05/18 (!) 138.3 kg (304 lb 14.3 oz)   History of present illness:  Cynthia Rosales is a 37 y.o. 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 Patient had recent partial cholecystectomy following which patient had a readmission due to worsening pain. Patient was eventually discharged and patient for the last 4 days had severe worsening abdominal pain mostly in the right upper quadrant with persistent nausea, vomiting and diarrhea. Unable to keep in anything. Unable to take her medications.  ED Course: In the ER patient's LFTs are found to be markedly elevated with CAT scan of the abdomen pelvis showing mild increasing fluid in the gallbladder fossa.  On-call general surgeon Dr. Marcello Moores has been consulted.  Admitted for further management.  Hospital Course:  Patient was admitted initially for worsening abdominal pain from recent cholecystectomy with worsening LFTs and also for sickle cell pain crisis. General Surgery and GI were on consult, MRCP was done which showed "Choledocholithiasis with mild intra and extrahepatic biliary ductal dilatation concerning for biliary tract obstruction. Persistent postoperative fluid collection in the gallbladder fossa in this patient status post cholecystectomy, concerning for potential biloma". Biloma was subsequently drained percutaneously via CT Guide by the surgical team. The Gastroenterologist/IR performed ERCP with stone retrieval, and ventral panc duct/CBD stenting on 6/3. Patient has biliary drainage in situ. She started feeling better after these procedures, LFT trended down nicely to even preadmission level, she had no fever, she remained hemodynamically stable. She started tolerating PO intake well without nausea or vomiting. Her sickle cell crisis was effectively managed with IVF, IV Dilaudid via PCA and IV Toradol, as well as other  adjunct therapies per sickle cell pain management protocols. She was on IV antibiotics throughout admission and discharged home on oral Augmentin for 7 days. She has  follow up appointment with Gastroenterologist and IR for follow-up CT in IR drain clinic within the next week. She will follow up with PCP (appointment made for 05/10/2018 at 10:30 am. She will follow up with general surgery within the next week as well. She did not require any blood transfusion, her Hb remained stable at baseline. Bile Cx was negative for infection.   Patient was discharged home today in a hemodynamically stable condition, prescribed 30 tablets of Oxycodone after discussion with her PCP and Augmentin for 7 days.   Discharge Exam: Vitals:   05/04/18 1051 05/04/18 1226  BP: (!) 151/92   Pulse: (!) 101   Resp: 16 16  Temp: 97.7 F (36.5 C)   SpO2: 96% 96%   Vitals:   05/04/18 0404 05/04/18 0808 05/04/18 1051 05/04/18 1226  BP: 101/87  (!) 151/92   Pulse: (!) 102  (!) 101   Resp: 18 17 16 16   Temp: 98.4 F (36.9 C)  97.7 F (36.5 C)   TempSrc: Oral  Oral   SpO2: 100% 100% 96% 96%  Weight:      Height:        General appearance : Awake, alert, not in any distress. Speech Clear. Not toxic looking, obese HEENT: Atraumatic and Normocephalic, pupils equally reactive to light and accomodation Neck: Supple, no JVD. No cervical lymphadenopathy.  Chest: Good air entry bilaterally, no added sounds  CVS: S1 S2 regular, no murmurs.  Abdomen: Bowel sounds present, mildly tender RUQ, drainage in situ, dressing clean and dry, not distended with no gaurding, rigidity or rebound. Extremities: B/L Lower Ext shows no edema, both legs are warm to touch Neurology: Awake alert, and oriented X 3, CN II-XII intact, Non focal Skin: No Rash  Discharge Instructions  Discharge Instructions    Diet - low sodium heart healthy   Complete by:  As directed    Increase activity slowly   Complete by:  As directed      Allergies as of 05/04/2018      Reactions   Amoxicillin Hives, Rash, Other (See Comments)   PATIENT HAS HAD A PCN REACTION WITH IMMEDIATE RASH, FACIAL/TONGUE/THROAT SWELLING,  SOB, OR LIGHTHEADEDNESS WITH HYPOTENSION:  #  #  YES  #  #  Has patient had a PCN reaction causing severe rash involving mucus membranes or skin necrosis: No Has patient had a PCN reaction that required hospitalization: No  Has patient had a PCN reaction occurring within the last 10 years: #  #  #  YES  #  #  #  If all of the above answers are "NO", then may proceed with Cephalosporin use.   Buprenorphine Hcl Itching, Nausea Only   Morphine And Related Itching, Nausea Only      Medication List    TAKE these medications   albuterol 108 (90 Base) MCG/ACT inhaler Commonly known as:  PROVENTIL HFA;VENTOLIN HFA Inhale 2 puffs into the lungs every 6 (six) hours as needed for wheezing or shortness of breath.   AMITIZA 24 MCG capsule Generic drug:  lubiprostone Take 24 mcg by mouth daily with breakfast.   amoxicillin-clavulanate 875-125 MG tablet Commonly known as:  AUGMENTIN Take 1 tablet by mouth 2 (two) times daily for 7 days.   CONCEPT OB 130-92.4-1 MG Caps Take 1 capsule by mouth daily.   diazepam 5  MG tablet Commonly known as:  VALIUM take 1 tablet BY MOUTH THREE TIMES DAILY AS NEEDED What changed:    how much to take  how to take this  when to take this  reasons to take this  additional instructions   hydrochlorothiazide 12.5 MG tablet Commonly known as:  HYDRODIURIL Take 1 tablet (12.5 mg total) by mouth daily.   hydrochlorothiazide 25 MG tablet Commonly known as:  HYDRODIURIL Take 25 mg by mouth daily.   ibuprofen 400 MG tablet Commonly known as:  ADVIL,MOTRIN Take 2 tablets (800 mg total) by mouth every 6 (six) hours as needed for headache or moderate pain.   metFORMIN 1000 MG tablet Commonly known as:  GLUCOPHAGE Take 1,000 mg by mouth daily with breakfast.   NOVOLIN 70/30 (70-30) 100 UNIT/ML injection Generic drug:  insulin NPH-regular Human Inject 35-36 Units into the skin See admin instructions. Inject 35 units subcutaneously after breakfast and 36  units after supper   ondansetron 4 MG disintegrating tablet Commonly known as:  ZOFRAN ODT Take 1 tablet (4 mg total) by mouth every 8 (eight) hours as needed for nausea or vomiting.   oxycodone 30 MG immediate release tablet Commonly known as:  ROXICODONE Take 1 tablet (30 mg total) by mouth every 6 (six) hours as needed for pain.   sitaGLIPtin 100 MG tablet Commonly known as:  JANUVIA Take 100 mg by mouth daily.       The results of significant diagnostics from this hospitalization (including imaging, microbiology, ancillary and laboratory) are listed below for reference.    Significant Diagnostic Studies: Nm Hepatobiliary Liver Func  Result Date: 04/28/2018 CLINICAL DATA:  37 year old female with possible biliary leak after cholecystectomy 04/03/2018. Abdominal pain vomiting and fever EXAM: NUCLEAR MEDICINE HEPATOBILIARY IMAGING TECHNIQUE: Sequential images of the abdomen were obtained out to 60 minutes following intravenous administration of radiopharmaceutical. RADIOPHARMACEUTICALS:  5.5 mCi Tc-56m  Choletec IV COMPARISON:  None. FINDINGS: Planar imaging was performed after administration of the radiotracer. The blood pool phase is unremarkable. There is uniform uptake of the radiotracer throughout the liver, however, the radiotracer does not excrete into the biliary system after we 2 hours of planar imaging, with persistent uptake throughout liver parenchyma. No GI activity or biliary activity identified after 2 hours. IMPRESSION: Radiotracer uptake is uniform throughout the liver, though is never excreted into the biliary system or GI system, indicating primary liver dysfunction/medical liver disease. Note that the current study cannot rule out a bile leak. Electronically Signed   By: Corrie Mckusick D.O.   On: 04/28/2018 14:50   Ct Abdomen Pelvis W Contrast  Result Date: 04/28/2018 CLINICAL DATA:  Acute onset of right upper quadrant abdominal pain, nausea, vomiting and diarrhea.  Fever. Recent cholecystectomy. EXAM: CT ABDOMEN AND PELVIS WITH CONTRAST TECHNIQUE: Multidetector CT imaging of the abdomen and pelvis was performed using the standard protocol following bolus administration of intravenous contrast. CONTRAST:  170mL ISOVUE-300 IOPAMIDOL (ISOVUE-300) INJECTION 61% COMPARISON:  CT of the abdomen and pelvis from 04/19/2018 FINDINGS: Lower chest: Minimal right basilar atelectasis is noted. The visualized portions of the mediastinum are unremarkable. Hepatobiliary: Residual fluid and trace air are noted at the gallbladder fossa, more prominent than on the prior study. However, no free fluid is seen tracking within the lower abdomen or pelvis to suggest a large bile leak. There is increased prominence of the intrahepatic biliary ducts, of uncertain significance. Would correlate with LFTs. The liver is otherwise unremarkable in appearance. Pancreas: The pancreas is within normal  limits. Spleen: The spleen is unremarkable in appearance. Adrenals/Urinary Tract: The adrenal glands are unremarkable in appearance. The kidneys are within normal limits. There is no evidence of hydronephrosis. No renal or ureteral stones are identified. No perinephric stranding is seen. Stomach/Bowel: The stomach is unremarkable in appearance. The small bowel is within normal limits. The patient is status post appendectomy. The colon is unremarkable in appearance. Vascular/Lymphatic: The abdominal aorta is unremarkable in appearance. A circumaortic left renal vein is incidentally noted. The inferior vena cava is grossly unremarkable. No retroperitoneal lymphadenopathy is seen. No pelvic sidewall lymphadenopathy is identified. Reproductive: The uterus is mildly distended and unremarkable in appearance. A left-sided uterine fibroid is noted. No suspicious adnexal masses are seen. Other: No additional soft tissue abnormalities are seen. Musculoskeletal: No acute osseous abnormalities are identified. The visualized  musculature is unremarkable in appearance. IMPRESSION: 1. Residual fluid and trace air at the gallbladder fossa, increased from the prior study. However, no free fluid is seen tracking within the lower abdomen or pelvis to suggest a large bile leak. A small bile leak cannot be excluded; if the patient's symptoms persist, HIDA scan could be considered for further evaluation. 2. Increased prominence of the intrahepatic biliary ducts, of uncertain significance. Would correlate with LFTs. 3. Left-sided uterine fibroid noted. Electronically Signed   By: Garald Balding M.D.   On: 04/28/2018 01:41   Ct Abdomen Pelvis W Contrast  Result Date: 04/19/2018 CLINICAL DATA:  Initial evaluation for acute generalized abdominal pain with nausea and vomiting. Recent cholecystectomy. EXAM: CT ABDOMEN AND PELVIS WITH CONTRAST TECHNIQUE: Multidetector CT imaging of the abdomen and pelvis was performed using the standard protocol following bolus administration of intravenous contrast. CONTRAST:  59mL ISOVUE-300 IOPAMIDOL (ISOVUE-300) INJECTION 61%, 172mL OMNIPAQUE IOHEXOL 300 MG/ML SOLN COMPARISON:  Prior CT and ultrasound from 04/01/2018. FINDINGS: Lower chest: Visualized lung bases are clear. Hepatobiliary: Liver demonstrates a normal contrast enhanced appearance. Patient is status post recent cholecystectomy. Small fluid collection with few foci of internal gas present within the gallbladder fossa, measuring approximately 4.1 x 2.7 x 2.8 cm. No significant rim enhancement or associated inflammatory changes. Mild scattered intrahepatic biliary dilatation, likely related post cholecystectomy changes. Common bile duct of normal caliber. Pancreas: Pancreas within normal limits. Spleen: Spleen within normal limits. Adrenals/Urinary Tract: Adrenal glands are normal. Kidneys equal in size with symmetric enhancement. No nephrolithiasis, hydronephrosis, or focal enhancing renal mass. No hydroureter. Bladder within normal limits.  Stomach/Bowel: Stomach within normal limits. No evidence for bowel obstruction. Appendix not visualize, consistent with history prior appendectomy. No abnormal wall thickening, mucosal enhancement, or inflammatory fat stranding seen about the bowels. Vascular/Lymphatic: Normal intravascular enhancement seen throughout the intra-abdominal aorta and its branch vessels. No aneurysm. No adenopathy. Reproductive: Probable fibroid uterus again noted, stable from previous. No other adnexal mass. Other: No free intraperitoneal air. No free fluid. Surgical clips noted within the umbilical region related to cholecystectomy. Soft tissue stranding within the subcutaneous fat of the mid and right ventral abdomen consistent with changes related to scope placement. No loculated collections or other complication from recent procedure. Musculoskeletal: No acute osseus abnormality. No worrisome lytic or blastic osseous lesions. IMPRESSION: 1. Postoperative changes from recent cholecystectomy. 4.1 x 2.7 x 2.8 cm collection within the gallbladder fossa of felt to be most consistent with a benign postoperative seroma. No significant inflammatory changes to suggest abscess or other complication. 2. No other acute intra-abdominal or pelvic process. 3. Fibroid uterus. Electronically Signed   By: Pincus Badder.D.  On: 04/19/2018 02:44   Mr 3d Recon At Scanner  Result Date: 04/30/2018 CLINICAL DATA:  37 year old female with history of abnormal liver function tests. Cholelithiasis. EXAM: MRI ABDOMEN WITHOUT AND WITH CONTRAST (INCLUDING MRCP) TECHNIQUE: Multiplanar multisequence MR imaging of the abdomen was performed both before and after the administration of intravenous contrast. Heavily T2-weighted images of the biliary and pancreatic ducts were obtained, and three-dimensional MRCP images were rendered by post processing. CONTRAST:  27mL MULTIHANCE GADOBENATE DIMEGLUMINE 529 MG/ML IV SOLN COMPARISON:  No prior abdominal MRI.  CT the abdomen and pelvis 04/28/2018. FINDINGS: Comment: Portions of the examination are limited by considerable patient respiratory motion. Lower chest: Unremarkable. Hepatobiliary: Status post cholecystectomy. Complex postoperative fluid collection in the gallbladder fossa measuring approximately 4.5 x 5.4 x 5.2 cm (axial image 21 of series 19 and coronal image 29 of series 11) which demonstrates heterogeneous T1 and T2 signal intensity but is generally T1 hypointense and T2 hyperintense with internal areas of slightly increased T1 hypointensity and decreased T2 intensity. This collection does not demonstrate internal enhancement, and is most compatible with a biloma. No other suspicious hepatic lesions are noted. There multiple filling defects within the common bile duct, compatible with choledocholithiasis. Common bile duct is dilated measuring up to 10 mm in the porta hepatis. There is also some mild intrahepatic biliary ductal dilatation. Pancreas: No pancreatic mass. No pancreatic ductal dilatation noted on MRCP images. No pancreatic or peripancreatic fluid or inflammatory changes. Spleen:  Unremarkable. Adrenals/Urinary Tract: Bilateral kidneys and bilateral adrenal glands are normal in appearance. No hydroureteronephrosis in the visualized portions of the abdomen. Stomach/Bowel: Visualized portions are unremarkable. Vascular/Lymphatic: No aneurysm identified in the visualized abdominal vasculature. No lymphadenopathy noted in the abdomen. Other: No significant volume of ascites noted in the visualized portions of the peritoneal cavity. Musculoskeletal: No aggressive appearing osseous lesions are noted in the visualized portions of the skeleton. IMPRESSION: 1. Choledocholithiasis with mild intra and extrahepatic biliary ductal dilatation concerning for biliary tract obstruction. 2. Persistent postoperative fluid collection in the gallbladder fossa in this patient status post cholecystectomy, concerning for  potential biloma. This could be further evaluated with HIDA scan if clinically appropriate. Electronically Signed   By: Vinnie Langton M.D.   On: 04/30/2018 17:54   Dg Ercp With Sphincterotomy  Result Date: 05/02/2018 CLINICAL DATA:  37 year old female with choledocholithiasis EXAM: ERCP TECHNIQUE: Multiple spot images obtained with the fluoroscopic device and submitted for interpretation post-procedure. FLUOROSCOPY TIME:  Fluoroscopy Time:  12 minutes 22 seconds reported Radiation Exposure Index (if provided by the fluoroscopic device): 230 mGy COMPARISON:  MRCP 04/30/2018 FINDINGS: A total of 22 saved images were obtained and submitted for review. The images demonstrate a flexible endoscope in the descending duodenum with wire cannulation of the hepatic ducts followed by cholangiogram. There is moderate intra and extrahepatic biliary ductal dilatation. Multiple filling defects in the common bile duct are consistent with choledocholithiasis. A percutaneous drainage catheter is present in the right upper quadrant. There is evidence of extravasation of contrast material in the region of the surgical clips, likely at the cystic duct remanent. Subsequent images demonstrate sphincterotomy, balloon sweep of the common duct and ultimately placement of a plastic biliary stent. IMPRESSION: 1. Choledocholithiasis with moderate intra and extrahepatic biliary ductal dilatation. 2. Extravasation of injected contrast material in the gallbladder fossa, likely from the cystic duct remnant. 3. Sphincterotomy, balloon sweep of the common duct and placement of a plastic biliary stent. These images were submitted for radiologic interpretation only. Please  see the procedural report for the amount of contrast and the fluoroscopy time utilized. Electronically Signed   By: Jacqulynn Cadet M.D.   On: 05/02/2018 11:05   Mr Abdomen Mrcp Moise Boring Contast  Result Date: 04/30/2018 CLINICAL DATA:  37 year old female with history of  abnormal liver function tests. Cholelithiasis. EXAM: MRI ABDOMEN WITHOUT AND WITH CONTRAST (INCLUDING MRCP) TECHNIQUE: Multiplanar multisequence MR imaging of the abdomen was performed both before and after the administration of intravenous contrast. Heavily T2-weighted images of the biliary and pancreatic ducts were obtained, and three-dimensional MRCP images were rendered by post processing. CONTRAST:  80mL MULTIHANCE GADOBENATE DIMEGLUMINE 529 MG/ML IV SOLN COMPARISON:  No prior abdominal MRI. CT the abdomen and pelvis 04/28/2018. FINDINGS: Comment: Portions of the examination are limited by considerable patient respiratory motion. Lower chest: Unremarkable. Hepatobiliary: Status post cholecystectomy. Complex postoperative fluid collection in the gallbladder fossa measuring approximately 4.5 x 5.4 x 5.2 cm (axial image 21 of series 19 and coronal image 29 of series 11) which demonstrates heterogeneous T1 and T2 signal intensity but is generally T1 hypointense and T2 hyperintense with internal areas of slightly increased T1 hypointensity and decreased T2 intensity. This collection does not demonstrate internal enhancement, and is most compatible with a biloma. No other suspicious hepatic lesions are noted. There multiple filling defects within the common bile duct, compatible with choledocholithiasis. Common bile duct is dilated measuring up to 10 mm in the porta hepatis. There is also some mild intrahepatic biliary ductal dilatation. Pancreas: No pancreatic mass. No pancreatic ductal dilatation noted on MRCP images. No pancreatic or peripancreatic fluid or inflammatory changes. Spleen:  Unremarkable. Adrenals/Urinary Tract: Bilateral kidneys and bilateral adrenal glands are normal in appearance. No hydroureteronephrosis in the visualized portions of the abdomen. Stomach/Bowel: Visualized portions are unremarkable. Vascular/Lymphatic: No aneurysm identified in the visualized abdominal vasculature. No  lymphadenopathy noted in the abdomen. Other: No significant volume of ascites noted in the visualized portions of the peritoneal cavity. Musculoskeletal: No aggressive appearing osseous lesions are noted in the visualized portions of the skeleton. IMPRESSION: 1. Choledocholithiasis with mild intra and extrahepatic biliary ductal dilatation concerning for biliary tract obstruction. 2. Persistent postoperative fluid collection in the gallbladder fossa in this patient status post cholecystectomy, concerning for potential biloma. This could be further evaluated with HIDA scan if clinically appropriate. Electronically Signed   By: Vinnie Langton M.D.   On: 04/30/2018 17:54   Ct Image Guided Drainage By Percutaneous Catheter  Result Date: 05/01/2018 INDICATION: Recent cholecystectomy, now with persistent fluid collection within the gallbladder fossa Please perform image guided percutaneous drainage catheter placement for infection source control purposes EXAM: ULTRASOUND AND CT GUIDED PERCUTANEOUS DRAINAGE CATHETER PLACEMENT INTO THE GALLBLADDER FOSSA COMPARISON:  CT abdomen and pelvis -0 04/28/2018; MRCP - 04/30/2018; nuclear medicine HIDA scan - 04/28/2018 MEDICATIONS: The patient is currently admitted to the hospital and receiving intravenous antibiotics. The antibiotics were administered within an appropriate time frame prior to the initiation of the procedure. ANESTHESIA/SEDATION: Moderate (conscious) sedation was employed during this procedure. A total of Versed 4 mg and Fentanyl 200 mcg was administered intravenously. Moderate Sedation Time: 20 minutes. The patient's level of consciousness and vital signs were monitored continuously by radiology nursing throughout the procedure under my direct supervision. CONTRAST:  None COMPLICATIONS: None immediate. PROCEDURE: Informed written consent was obtained from the patient after a discussion of the risks, benefits and alternatives to treatment. The patient was placed  supine on the CT gantry and a pre procedural CT was performed  re-demonstrating the known abscess/fluid collection within the gallbladder fossa with dominant component measuring approximately 6.0 x 4.9 cm (image 20, series 2). Sonographic evaluation was performed of the right upper abdominal quadrant adequately demonstrating the location of the gallbladder fossa fluid collection. The procedure was planned. A timeout was performed prior to the initiation of the procedure. The skin overlying the right upper abdomen was prepped and draped in the usual sterile fashion. The overlying soft tissues were anesthetized with 1% lidocaine with epinephrine. Under direct ultrasound guidance, an 18 gauge trocar needle was advanced into the abscess/fluid collection and a short Amplatz super stiff wire was coiled within the collection. Appropriate positioning was confirmed with a limited CT scan. The tract was serially dilated allowing placement of a 10 [place] all-purpose drainage catheter. Appropriate positioning was confirmed with a limited postprocedural CT scan. Approximately 60 cc of bilious appearing non foul smelling fluid was aspirated. The tube was connected to a drainage bag and sutured in place. A dressing was placed. The patient tolerated the procedure well without immediate post procedural complication. IMPRESSION: Successful ultrasound and CT guided placement of a 10 French all purpose drain catheter into the gallbladder fossa fluid collection with aspiration of 60 cc of bilious appearing, non foul smelling fluid. Samples were sent to the laboratory (including for the acquisition of bilirubin levels) as requested by the ordering clinical team. Electronically Signed   By: Sandi Mariscal M.D.   On: 05/01/2018 15:34    Microbiology: Recent Results (from the past 240 hour(s))  Aerobic/Anaerobic Culture (surgical/deep wound)     Status: None (Preliminary result)   Collection Time: 05/01/18 10:00 PM  Result Value Ref  Range Status   Specimen Description   Final    GALL BLADDER DRAINAGE Performed at [facility] [address], [place], [place] [address]    Special Requests   Final    Normal Performed at [facility], [place] [address] [place], [place] [address]    Gram Stain NO WBC SEEN NO ORGANISMS SEEN   Final   Culture   Final    NO GROWTH 2 DAYS NO ANAEROBES ISOLATED; CULTURE IN PROGRESS FOR 5 DAYS Performed at [facility] [address], [place], [place] [address]    Report Status PENDING  Incomplete     Labs: Basic Metabolic Panel: Recent Labs  Lab 04/30/18 0605 05/01/18 0530 05/02/18 0558 05/03/18 0557 05/04/18 0552  NA 137 136 135 135 137  K 3.4* 3.7 3.5 3.9 3.9  CL 103 104 102 100* 100*  CO2 27 22 22 26 28   GLUCOSE 122* 183* 134* 297* 224*  BUN 6 6 6 12 14   CREATININE 0.72 0.70 0.66 1.08* 0.86  CALCIUM 8.1* 8.6* 8.4* 8.3* 8.5*   Liver Function Tests: Recent Labs  Lab 04/30/18 0605 05/01/18 0530 05/02/18 0558 05/03/18 0557 05/04/18 0552  AST 364* 377* 187* 123* 72*  ALT 495* 497* 384* 299* 232*  ALKPHOS 368* 387* 362* 310* 279*  BILITOT 1.7* 1.7* 1.2 0.7 0.8  PROT 6.6 7.0 7.1 6.6 6.9  ALBUMIN 2.7* 2.9* 2.8* 2.8* 2.8*   Recent Labs  Lab 04/27/18 2320 05/01/18 0530  LIPASE 29 41   No results for input(s): AMMONIA in the last 168 hours. CBC: Recent Labs  Lab 04/28/18 0636 04/29/18 1113 04/30/18 0605 05/01/18 0530 05/02/18 0558 05/04/18 0552  WBC 13.3* 9.8 9.0 8.7 9.3 10.5  NEUTROABS 9.9*  --  4.1 4.6 5.4 5.1  HGB 8.4* 9.5*  9.0* 9.9* 9.4* 8.6*  HCT 26.1* 30.3* 28.6* 31.1* 29.7* 27.7*  MCV 67.4* 68.4* 68.6* 68.2* 68.6* 69.9*  PLT 458* 405* 354 368 315 351   Cardiac Enzymes: No results for input(s): CKTOTAL, CKMB, CKMBINDEX, TROPONINI in the last 168 hours. BNP: Invalid input(s): POCBNP CBG: Recent Labs  Lab 05/03/18 2007 05/04/18 0003 05/04/18 0403 05/04/18 0738 05/04/18 1146  GLUCAP 246* 288*  243* 189* 254*    Time coordinating discharge: 50 minutes  Signed:  Rickey Sadowski  [facility] 05/04/2018, 3:55 PM

## 2018-05-05 LAB — TOTAL BILIRUBIN, BODY FLUID: Total bilirubin, fluid: 20.8 mg/dL

## 2018-05-06 ENCOUNTER — Telehealth: Payer: Self-pay

## 2018-05-06 NOTE — Telephone Encounter (Signed)
Pt scheduled to see Amy Esterwood PA 06/06/18@10am . Appt letter mailed to pt.

## 2018-05-06 NOTE — Telephone Encounter (Signed)
-----   Message from Irene Shipper, MD sent at 05/05/2018  5:10 PM EDT ----- Regarding: Follow-up appointment with Cynthia Rosales, Please get this patient an office follow-up with Amy in about 4 weeks. Thanks

## 2018-05-07 LAB — AEROBIC/ANAEROBIC CULTURE (SURGICAL/DEEP WOUND)
Culture: NO GROWTH
Gram Stain: NONE SEEN
Special Requests: NORMAL

## 2018-05-07 LAB — AEROBIC/ANAEROBIC CULTURE W GRAM STAIN (SURGICAL/DEEP WOUND)

## 2018-05-09 ENCOUNTER — Encounter (HOSPITAL_COMMUNITY): Payer: Self-pay

## 2018-05-09 ENCOUNTER — Emergency Department (HOSPITAL_COMMUNITY): Payer: Medicare Other

## 2018-05-09 ENCOUNTER — Telehealth: Payer: Self-pay | Admitting: Internal Medicine

## 2018-05-09 ENCOUNTER — Ambulatory Visit
Admission: RE | Admit: 2018-05-09 | Discharge: 2018-05-09 | Disposition: A | Discharge status: Home / Self Care | Payer: Medicare Other | Source: Ambulatory Visit | Attending: Internal Medicine | Admitting: Internal Medicine

## 2018-05-09 ENCOUNTER — Other Ambulatory Visit: Payer: Self-pay

## 2018-05-09 ENCOUNTER — Emergency Department (HOSPITAL_COMMUNITY)
Admission: EM | Admit: 2018-05-09 | Discharge: 2018-05-10 | Disposition: A | Discharge status: Home / Self Care | Payer: Medicare Other | Attending: Emergency Medicine | Admitting: Emergency Medicine

## 2018-05-09 DIAGNOSIS — Z794 Long term (current) use of insulin: Secondary | ICD-10-CM | POA: Diagnosis not present

## 2018-05-09 DIAGNOSIS — I10 Essential (primary) hypertension: Secondary | ICD-10-CM | POA: Insufficient documentation

## 2018-05-09 DIAGNOSIS — Z87891 Personal history of nicotine dependence: Secondary | ICD-10-CM | POA: Diagnosis not present

## 2018-05-09 DIAGNOSIS — E119 Type 2 diabetes mellitus without complications: Secondary | ICD-10-CM | POA: Insufficient documentation

## 2018-05-09 DIAGNOSIS — R1010 Upper abdominal pain, unspecified: Secondary | ICD-10-CM | POA: Insufficient documentation

## 2018-05-09 DIAGNOSIS — Z9689 Presence of other specified functional implants: Secondary | ICD-10-CM

## 2018-05-09 LAB — COMPREHENSIVE METABOLIC PANEL
ALT: 55 U/L — ABNORMAL HIGH (ref 14–54)
AST: 21 U/L (ref 15–41)
Albumin: 3.4 g/dL — ABNORMAL LOW (ref 3.5–5.0)
Alkaline Phosphatase: 196 U/L — ABNORMAL HIGH (ref 38–126)
Anion gap: 11 (ref 5–15)
BUN: 10 mg/dL (ref 6–20)
CO2: 25 mmol/L (ref 22–32)
Calcium: 8.6 mg/dL — ABNORMAL LOW (ref 8.9–10.3)
Chloride: 103 mmol/L (ref 101–111)
Creatinine, Ser: 0.81 mg/dL (ref 0.44–1.00)
GFR calc Af Amer: >60 mL/min (ref >60)
GFR calc non Af Amer: >60 mL/min (ref >60)
Glucose, Bld: 316 mg/dL — ABNORMAL HIGH (ref 65–99)
Potassium: 3.4 mmol/L — ABNORMAL LOW (ref 3.5–5.1)
Sodium: 139 mmol/L (ref 135–145)
Total Bilirubin: 0.7 mg/dL (ref 0.3–1.2)
Total Protein: 8 g/dL (ref 6.5–8.1)

## 2018-05-09 LAB — CBC WITH DIFFERENTIAL/PLATELET
Basophils Absolute: 0 10*3/uL (ref 0.0–0.1)
Basophils Relative: 0 %
Eosinophils Absolute: 0.2 10*3/uL (ref 0.0–0.7)
Eosinophils Relative: 2 %
HCT: 30.1 % — ABNORMAL LOW (ref 36.0–46.0)
Hemoglobin: 9.5 g/dL — ABNORMAL LOW (ref 12.0–15.0)
Lymphocytes Relative: 24 %
Lymphs Abs: 2.6 10*3/uL (ref 0.7–4.0)
MCH: 21.7 pg — ABNORMAL LOW (ref 26.0–34.0)
MCHC: 31.6 g/dL (ref 30.0–36.0)
MCV: 68.7 fL — ABNORMAL LOW (ref 78.0–100.0)
Monocytes Absolute: 0.5 10*3/uL (ref 0.1–1.0)
Monocytes Relative: 5 %
Neutro Abs: 7.4 10*3/uL (ref 1.7–7.7)
Neutrophils Relative %: 69 %
Platelets: 470 10*3/uL — ABNORMAL HIGH (ref 150–400)
RBC: 4.38 MIL/uL (ref 3.87–5.11)
RDW: 18.8 % — ABNORMAL HIGH (ref 11.5–15.5)
WBC: 10.7 10*3/uL — ABNORMAL HIGH (ref 4.0–10.5)

## 2018-05-09 LAB — I-STAT BETA HCG BLOOD, ED (MC, WL, AP ONLY): I-stat hCG, quantitative: <5 m[IU]/mL (ref <5)

## 2018-05-09 LAB — LIPASE, BLOOD: Lipase: 30 U/L (ref 11–51)

## 2018-05-09 MED ORDER — IOPAMIDOL (ISOVUE-300) INJECTION 61%
INTRAVENOUS | Status: AC
  Filled 2018-05-09: qty 100

## 2018-05-09 MED ORDER — ONDANSETRON 4 MG PO TBDP: ORAL_TABLET | ORAL | 0 refills | Status: DC

## 2018-05-09 MED ORDER — SODIUM CHLORIDE 0.9 % IV BOLUS
500.0000 mL | Freq: Once | INTRAVENOUS | Status: AC
  Administered 2018-05-09: 500 mL via INTRAVENOUS

## 2018-05-09 MED ORDER — IOPAMIDOL (ISOVUE-300) INJECTION 61%
100.0000 mL | Freq: Once | INTRAVENOUS | Status: AC | PRN
  Administered 2018-05-09: 100 mL via INTRAVENOUS

## 2018-05-09 MED ORDER — HYDROMORPHONE HCL 1 MG/ML IJ SOLN
0.5000 mg | Freq: Once | INTRAMUSCULAR | Status: AC
  Administered 2018-05-09: 0.5 mg via INTRAVENOUS
  Filled 2018-05-09: qty 1

## 2018-05-09 MED ORDER — HYDROMORPHONE HCL 1 MG/ML IJ SOLN
0.5000 mg | Freq: Once | INTRAMUSCULAR | Status: AC
Start: 2018-05-09 — End: 2018-05-09
  Administered 2018-05-09: 0.5 mg via INTRAVENOUS
  Filled 2018-05-09: qty 1

## 2018-05-09 MED ORDER — ONDANSETRON HCL 4 MG/2ML IJ SOLN
4.0000 mg | Freq: Once | INTRAMUSCULAR | Status: AC
  Administered 2018-05-09: 4 mg via INTRAVENOUS
  Filled 2018-05-09: qty 2

## 2018-05-09 MED ORDER — HYDROMORPHONE HCL 4 MG PO TABS
4.0000 mg | ORAL_TABLET | ORAL | 0 refills | Status: DC | PRN
Start: 2018-05-09 — End: 2018-07-20

## 2018-05-09 NOTE — ED Triage Notes (Signed)
Pt was released from the hospital after having a stent and a drain placed to go to a graduation Pt complains of severe pain at the drain site and said that the clamp popped off earlier Pt was seen at her primary doctor and told to come to the ED

## 2018-05-09 NOTE — ED Provider Notes (Signed)
[facility] DEPT Provider Note   CSN: [id number] Arrival date & time: 05/09/18  1800     History   Chief Complaint Chief Complaint  Patient presents with  . Flank Pain    HPI Cynthia Rosales is a 37 y.o. female.  Patient presents with pain over her drain.  Patient had a cholecystectomy done and then she had persistent fluid collection in the gallbladder fossa.  This was concerning for potential bili [place].  She had a percutaneous drain placed by IR  The history is provided by the patient.  Abdominal Pain   This is a new problem. The current episode started 12 to 24 hours ago. The problem occurs constantly. The problem has not changed since onset.The pain is associated with an unknown factor. The pain is located in the RUQ. The pain is at a severity of 3/10. The pain is mild. Pertinent negatives include anorexia, diarrhea, frequency, hematuria and headaches. Nothing aggravates the symptoms.    Past Medical History:  Diagnosis Date  . Abscess of bursa, left elbow 06/2013   Treated with I and D/antibiotics.   . Anemia   . Asthma   . Depression   . History of blood transfusion    "after I had one of my kids"  . Hypertension   . Ovarian cyst   . Renal disorder    kidney stones  . Sickle cell disease ([place])   . Type II diabetes mellitus [facility])     Patient Active Problem List   Diagnosis Date Noted  . Bile leak, postoperative   . Choledocholithiasis   . Intra-abdominal fluid collection   . Anemia 04/28/2018  . Morbid obesity with body mass index (BMI) of 50.0 to 59.9 in adult ([name]) 04/02/2018  . Mass of uterus 04/02/2018  . Transaminitis 04/02/2018  . Acute cholecystitis s/p lap cholecystectomy 04/03/2018 04/01/2018  . Pregnancy, ectopic, cornual or cervical 07/21/2017  . 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  . Neurogenic bladder 08/23/2015  . Bilateral carpal tunnel syndrome 08/23/2015  . Paraparesis of both lower limbs ([facility]) 07/14/2015  . Ileus,  postoperative ([place]) 12/25/2014  . Sickle cell disease without crisis ([place]) 12/25/2014  . Diabetes mellitus type 2 in obese ([place]) 12/25/2014  . 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[place]) 12/24/2014  . 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 Ileus of unspecified type ([place])   . Atelectasis   . Bowel obstruction ([place])   . Encounter for central line care   . Ileus ([place])   . Intestinal occlusion ([facility])   . 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  . Headache 11/19/2014  . Neck pain 11/19/2014  . Right sided weakness 11/19/2014  . Paresthesias 11/19/2014  . Nightmares 09/11/2014  . HTN (hypertension), benign 03/28/2014  . Nephrolithiasis 03/27/2014  . Diabetes mellitus ([place]) 03/27/2014  . Abdominal pain 03/27/2014    Past Surgical History:  Procedure Laterality Date  . APPENDECTOMY  2013  . BILIARY STENT PLACEMENT N/A 05/02/2018   Procedure: BILIARY STENT PLACEMENT;  Surgeon: Doran Stabler, MD;  [facility];  Service: Gastroenterology;  Laterality: N/A;  . CARPAL TUNNEL RELEASE    . CESAREAN SECTION  2000; 2007; 2011  . CHOLECYSTECTOMY N/A 04/03/2018   Procedure: LAPAROSCOPIC CHOLECYSTECTOMY;  Surgeon: Rolm Bookbinder, MD;  Location: [facility];  Service: General;  Laterality: N/A;  . DILATION AND CURETTAGE OF UTERUS    . ERCP N/A 05/02/2018   Procedure: ENDOSCOPIC RETROGRADE CHOLANGIOPANCREATOGRAPHY (ERCP);  Surgeon: Nelida Meuse  III, MD;  Location: [facility];  Service: Gastroenterology;  Laterality: N/A;  . LAMINECTOMY N/A 12/13/2014   Procedure: CERVICAL LAMINECTOMY FOR INTRADURAL TUMOR;  Surgeon: Charlie Pitter, MD;  Location: [facility];  Service: Neurosurgery;  Laterality: N/A;  posterior  . LAPAROSCOPY N/A 07/20/2017   Procedure: LAPAROSCOPY OPERATIVE WITH WEDGE RESECTION RIGHT CORNUA AND PARTIAL SALPINGECTOMY;  Surgeon: [name], MD;  Location: [facility];  Service: Gynecology;  Laterality: N/A;  . REDUCTION MAMMAPLASTY Bilateral 1998  . REMOVAL OF  STONES  05/02/2018   Procedure: REMOVAL OF STONES;  Surgeon: Doran Stabler, MD;  [facility];  Service: Gastroenterology;;  . [name]  05/02/2018   Procedure: SPHINCTEROTOMY;  Surgeon: Doran Stabler, MD;  [facility];  Service: Gastroenterology;;     OB History    Gravida  5   Para  3   Term  2   Preterm  1   AB      Living  3     SAB      TAB      Ectopic      Multiple      Live Births  3            Home Medications    Prior to Admission medications   Medication Sig Start Date End Date Taking? Authorizing Provider  albuterol (PROVENTIL HFA;VENTOLIN HFA) 108 (90 Base) MCG/ACT inhaler Inhale 2 puffs into the lungs every 6 (six) hours as needed for wheezing or shortness of breath.   Yes [provider]  AMITIZA 24 MCG capsule Take 24 mcg by mouth daily with breakfast.  04/27/18  Yes [provider]  amoxicillin-clavulanate (AUGMENTIN) 875-125 MG tablet Take 1 tablet by mouth 2 (two) times daily for 7 days. 05/04/18 05/11/18 Yes Jegede, Marlena Clipper, MD  diazepam (VALIUM) 5 MG tablet take 1 tablet BY MOUTH THREE TIMES DAILY AS NEEDED Patient taking differently: Take 5 mg by mouth every 8 (eight) hours as needed for anxiety (pain). take 1 tablet BY MOUTH THREE TIMES DAILY AS NEEDED 04/27/18  Yes Melvenia Beam, MD  hydrochlorothiazide (HYDRODIURIL) 25 MG tablet Take 25 mg by mouth daily.  04/27/18  Yes [provider]  ibuprofen (ADVIL,MOTRIN) 400 MG tablet Take 2 tablets (800 mg total) by mouth every 6 (six) hours as needed for headache or moderate pain. 05/04/18  Yes Tresa Garter, MD  insulin NPH-regular Human (NOVOLIN 70/30) (70-30) 100 UNIT/ML injection Inject 35-36 Units into the skin See admin instructions. Inject 35 units subcutaneously after breakfast and 36 units after supper   Yes [provider]  metFORMIN (GLUCOPHAGE) 1000 MG tablet Take 1,000 mg by mouth daily with breakfast.  04/27/18   Yes [provider]  oxycodone (ROXICODONE) 30 MG immediate release tablet Take 1 tablet (30 mg total) by mouth every 6 (six) hours as needed for pain. 05/04/18  Yes Jegede, Olugbemiga E, MD  sitaGLIPtin (JANUVIA) 100 MG tablet Take 100 mg by mouth daily.   Yes [provider]  hydrochlorothiazide (HYDRODIURIL) 12.5 MG tablet Take 1 tablet (12.5 mg total) by mouth daily. Patient not taking: Reported on 04/27/2018 04/06/18   Roxan Hockey, MD  HYDROmorphone (DILAUDID) 4 MG tablet Take 1 tablet (4 mg total) by mouth every 4 (four) hours as needed for severe pain. 05/09/18   Milton Ferguson, MD  ondansetron (ZOFRAN ODT) 4 MG disintegrating tablet 4mg  ODT q4 hours prn nausea/vomit 05/09/18   Milton Ferguson, MD  Prenat w/o A Vit-FeFum-FePo-FA (CONCEPT OB) 130-92.4-1 MG CAPS Take 1 capsule by mouth daily. Patient not taking: Reported on 11/15/2017 08/09/17   [name], MD    Family History Family History  Problem Relation Age of Onset  . Diabetes Mother   . Hypertension Mother   . Breast cancer Maternal Aunt   . Ovarian cancer Maternal Grandmother   . Breast cancer Maternal Aunt   . Migraines Neg Hx     Social History Social History   Tobacco Use  . Smoking status: Former Smoker    Years: 0.00  . Smokeless tobacco: Never Used  . Tobacco comment: 03/27/2014 "smoked ~ 1 cigarette/day; quit in ~ 2013"  Substance Use Topics  . Alcohol use: Yes    Alcohol/week: 0.0 oz  . Drug use: No     Allergies   Amoxicillin; Buprenorphine hcl; and Morphine and related   Review of Systems Review of Systems  Constitutional: Negative for appetite change and fatigue.  HENT: Negative for congestion, ear discharge and sinus pressure.   Eyes: Negative for discharge.  Respiratory: Negative for cough.   Cardiovascular: Negative for chest pain.  Gastrointestinal: Positive for abdominal pain. Negative for anorexia and diarrhea.  Genitourinary: Negative for frequency and hematuria.    Musculoskeletal: Negative for back pain.  Skin: Negative for rash.  Neurological: Negative for seizures and headaches.  Psychiatric/Behavioral: Negative for hallucinations.     Physical Exam Updated Vital Signs BP (!) 148/85 (BP Location: Right Arm)   Pulse 97   Temp 98.6 F (37 C) (Oral)   Resp 17   LMP 05/02/2018 Comment: negative hCG 05/09/18  SpO2 100%   Physical Exam  Constitutional: She is oriented to person, place, and time. She appears well-developed.  HENT:  Head: Normocephalic.  Eyes: Conjunctivae and EOM are normal. No scleral icterus.  Neck: Neck supple. No thyromegaly present.  Cardiovascular: Normal rate and regular rhythm. Exam reveals no gallop and no friction rub.  No murmur heard. Pulmonary/Chest: No stridor. She has no wheezes. She has no rales. She exhibits no tenderness.  Abdominal: She exhibits no distension. There is tenderness. There is no rebound.  Patient has tenderness right over the drain.  When you move the drain around she complains of discomfort.  No obvious infection around the drain.  Musculoskeletal: Normal range of motion. She exhibits no edema.  Lymphadenopathy:    She has no cervical adenopathy.  Neurological: She is oriented to person, place, and time. She exhibits normal muscle tone. Coordination normal.  Skin: No rash noted. No erythema.  Psychiatric: She has a normal mood and affect. Her behavior is normal.     ED Treatments / Results  Labs (all labs ordered are listed, but only abnormal results are displayed) Labs Reviewed  CBC WITH DIFFERENTIAL/PLATELET - Abnormal; Notable for the following components:      Result Value   WBC 10.7 (*)    Hemoglobin 9.5 (*)    HCT 30.1 (*)    MCV 68.7 (*)    MCH 21.7 (*)    RDW 18.8 (*)    Platelets 470 (*)    All other components within normal limits  COMPREHENSIVE METABOLIC PANEL - Abnormal; Notable for the following components:   Potassium 3.4 (*)    Glucose, Bld 316 (*)    Calcium  8.6 (*)    Albumin 3.4 (*)    ALT 55 (*)    Alkaline Phosphatase 196 (*)    All other components within normal  limits  LIPASE, BLOOD  I-STAT BETA HCG BLOOD, ED ([facility], [facility], [facility] ONLY)    EKG None  Radiology Dg Abd 1 View  Result Date: 05/09/2018 CLINICAL DATA:  Right lower quadrant abdominal pain for 3 days. Postoperative cholecystectomy and catheter placement. EXAM: ABDOMEN - 1 VIEW COMPARISON:  CT scan May 01, 2018 FINDINGS: The right upper quadrant pigtail catheter is immediately adjacent to this cholecystectomy clips. The tubing is intact. There is a common bile duct stent identified. No other abnormalities. IMPRESSION: Right upper quadrant pigtail catheter and common bile duct stent as above. Electronically Signed   By: Dorise Bullion III M.D   On: 05/09/2018 17:22   Ct Abdomen Pelvis W Contrast  Result Date: 05/09/2018 CLINICAL DATA:  Patient released from the hospital after having a stent and drain placed go to graduation. Patient presents with severe pain at the drain site and says that the clamp popped off earlier. EXAM: CT ABDOMEN AND PELVIS WITH CONTRAST TECHNIQUE: Multidetector CT imaging of the abdomen and pelvis was performed using the standard protocol following bolus administration of intravenous contrast. CONTRAST:  120mL ISOVUE-300 IOPAMIDOL (ISOVUE-300) INJECTION 61% COMPARISON:  05/01/2018 and 04/28/2018 FINDINGS: Lower chest: Normal size heart. No pericardial effusion. No active pulmonary disease. Hepatobiliary: Percutaneous drain in the gallbladder fossa is identified without reaccumulation of fluid. Common bile duct stent is also present in appropriate position. Pneumobilia from previous intervention is seen. No space-occupying mass of the liver. No abscess or free flowing fluid is identified. Pancreas: Normal Spleen: Normal Adrenals/Urinary Tract: Normal Stomach/Bowel: Stomach is within normal limits. Status post appendectomy no evidence of bowel wall thickening, distention, or  inflammatory changes. Vascular/Lymphatic: Circumaortic left renal vein. Nonaneurysmal abdominal aorta. No adenopathy. Reproductive: Redemonstration of fibroid uterus.  No adnexal mass. Other: No free air nor free fluid. No abnormal fluid collection along the course of the right upper quadrant percutaneous catheter. Musculoskeletal: No acute osseous abnormality. IMPRESSION: 1. No reaccumulation of gallbladder fossa fluid status post percutaneous drainage placement. 2. Indwelling CBD stent is unremarkable in appearance. Pneumobilia from recent intervention is seen. 3. No CT findings for the patient's pain at the drain site. 4. Fibroid uterus. Electronically Signed   By: Ashley Royalty M.D.   On: 05/09/2018 22:51    Procedures Procedures (including critical care time)  Medications Ordered in ED Medications  iopamidol (ISOVUE-300) 61 % injection (has no administration in time range)  HYDROmorphone (DILAUDID) injection 0.5 mg (has no administration in time range)  sodium chloride 0.9 % bolus 500 mL (0 mLs Intravenous Stopped 05/09/18 2138)  HYDROmorphone (DILAUDID) injection 0.5 mg (0.5 mg Intravenous Given 05/09/18 2039)  ondansetron (ZOFRAN) injection 4 mg (4 mg Intravenous Given 05/09/18 2038)  HYDROmorphone (DILAUDID) injection 0.5 mg (0.5 mg Intravenous Given 05/09/18 2144)  iopamidol (ISOVUE-300) 61 % injection 100 mL (100 mLs Intravenous Contrast Given 05/09/18 2214)     Initial Impression / Assessment and Plan / ED Course  I have reviewed the triage vital signs and the nursing notes.  Pertinent labs & imaging results that were available during my care of the patient were reviewed by me and considered in my medical decision making (see chart for details).     Patient's labs indicate that her liver functions are improving.  Her bilirubin has improved.  All her liver enzymes are improved from 5 days ago.  Chemistries show sugar is elevated at 300.  CT scan no fluid in the gallbladder fossa.  Common  bile ducts stent is unremarkable.  Patient  has an appointment to follow-up with Dr. Donne Hazel in 2 days.  She is given nausea medicine and Dilaudid pills for the pain.  She is instructed to make sure she sees Dr. Donne Hazel and return if she has any problems.     I spoke with a bowel gastroenterology.  I discussed all the lab results and CT scan and the findings on exam with the patient.  It was felt the patient was good for discharge and be followed up with surgeon as planned Final Clinical Impressions(s) / ED Diagnoses   Final diagnoses:  Pain of upper abdomen    ED Discharge Orders        Ordered    HYDROmorphone (DILAUDID) 4 MG tablet  Every 4 hours PRN     05/09/18 2321    ondansetron (ZOFRAN ODT) 4 MG disintegrating tablet     05/09/18 2321       Milton Ferguson, MD 05/10/18 0004

## 2018-05-09 NOTE — ED Notes (Signed)
Pt called from the lobby with no response x2 

## 2018-05-09 NOTE — Discharge Instructions (Addendum)
You have an appointment on June 12 at 3:30 PM to see Dr. Donne Hazel.  Tell him that you are in the emergency department on Monday and we did x-rays and blood work and he will review all labs.

## 2018-05-09 NOTE — Telephone Encounter (Signed)
[facility] called regarding pt. Pt had ERCP done and drain placed, drain has stopped putting out secretion and is causing a lot of pain to pt. Pls call Sharyn Lull.

## 2018-05-10 NOTE — Telephone Encounter (Signed)
Pt was seen in the ER yesterday.

## 2018-05-12 ENCOUNTER — Other Ambulatory Visit (HOSPITAL_COMMUNITY): Payer: Self-pay | Admitting: Radiology

## 2018-05-12 ENCOUNTER — Other Ambulatory Visit: Payer: Self-pay | Admitting: General Surgery

## 2018-05-12 DIAGNOSIS — K9189 Other postprocedural complications and disorders of digestive system: Secondary | ICD-10-CM

## 2018-05-12 DIAGNOSIS — K838 Other specified diseases of biliary tract: Secondary | ICD-10-CM

## 2018-05-12 DIAGNOSIS — R188 Other ascites: Secondary | ICD-10-CM

## 2018-05-19 ENCOUNTER — Ambulatory Visit
Admission: RE | Admit: 2018-05-19 | Discharge: 2018-05-19 | Disposition: A | Discharge status: Home / Self Care | Payer: Medicare Other | Source: Ambulatory Visit | Attending: Radiology | Admitting: Radiology

## 2018-05-19 ENCOUNTER — Ambulatory Visit
Admission: RE | Admit: 2018-05-19 | Discharge: 2018-05-19 | Disposition: A | Discharge status: Home / Self Care | Payer: Medicare Other | Source: Ambulatory Visit | Attending: General Surgery | Admitting: General Surgery

## 2018-05-19 ENCOUNTER — Encounter: Payer: Self-pay | Admitting: Radiology

## 2018-05-19 DIAGNOSIS — K838 Other specified diseases of biliary tract: Secondary | ICD-10-CM

## 2018-05-19 DIAGNOSIS — R188 Other ascites: Secondary | ICD-10-CM

## 2018-05-19 DIAGNOSIS — K9189 Other postprocedural complications and disorders of digestive system: Secondary | ICD-10-CM

## 2018-05-19 HISTORY — PX: IR RADIOLOGIST EVAL & MGMT: IMG5224

## 2018-05-19 NOTE — Progress Notes (Signed)
Referring Physician(s): Dr Jeanmarie Hubert  Chief Complaint: The patient is seen in follow up today s/p 05/01/18: Successful ultrasound and CT guided placement of a 10 French all purpose drain catheter into the gallbladder fossa fluid collection   History of present illness:  CT 6/10:  IMPRESSION: 1. No reaccumulation of gallbladder fossa fluid status post percutaneous drainage placement. 2. Indwelling CBD stent is unremarkable in appearance. Pneumobilia from recent intervention is seen. 3. No CT findings for the patient's pain at the drain site. 4. Fibroid uterus.  Today pt is here for recheck and drain injection/possible removal States output is minimal- yellow fluid Flushing daily No bleeding No pain NT No fever; chills No longer on antibiotics Was seen on 6/10 by Dr Donne Hazel    Past Medical History:  Diagnosis Date  . Abscess of bursa, left elbow 06/2013   Treated with I and D/antibiotics.   . Anemia   . Asthma   . Depression   . History of blood transfusion    "after I had one of my kids"  . Hypertension   . Ovarian cyst   . Renal disorder    kidney stones  . Sickle cell disease ([place])   . Type II diabetes mellitus ([place])     Past Surgical History:  Procedure Laterality Date  . APPENDECTOMY  2013  . BILIARY STENT PLACEMENT N/A 05/02/2018   Procedure: BILIARY STENT PLACEMENT;  Surgeon: Doran Stabler, MD;  Location: [facility];  Service: Gastroenterology;  Laterality: N/A;  . CARPAL TUNNEL RELEASE    . CESAREAN SECTION  2000; 2007; 2011  . CHOLECYSTECTOMY N/A 04/03/2018   Procedure: LAPAROSCOPIC CHOLECYSTECTOMY;  Surgeon: Rolm Bookbinder, MD;  Location: [facility];  Service: General;  Laterality: N/A;  . DILATION AND CURETTAGE OF UTERUS    . ERCP N/A 05/02/2018   Procedure: ENDOSCOPIC RETROGRADE CHOLANGIOPANCREATOGRAPHY (ERCP);  Surgeon: Doran Stabler, MD;  Location: [facility];  Service: Gastroenterology;  Laterality: N/A;  . IR RADIOLOGIST EVAL &  MGMT  05/19/2018  . LAMINECTOMY N/A 12/13/2014   Procedure: CERVICAL LAMINECTOMY FOR INTRADURAL TUMOR;  Surgeon: Charlie Pitter, MD;  Location: [facility];  Service: Neurosurgery;  Laterality: N/A;  posterior  . LAPAROSCOPY N/A 07/20/2017   Procedure: LAPAROSCOPY OPERATIVE WITH WEDGE RESECTION RIGHT CORNUA AND PARTIAL SALPINGECTOMY;  Surgeon: [name], MD;  Location: [facility];  Service: Gynecology;  Laterality: N/A;  . REDUCTION MAMMAPLASTY Bilateral 1998  . REMOVAL OF STONES  05/02/2018   Procedure: REMOVAL OF STONES;  Surgeon: Doran Stabler, MD;  Location: [facility];  Service: Gastroenterology;;  . [name]  05/02/2018   Procedure: SPHINCTEROTOMY;  Surgeon: Doran Stabler, MD;  Location: [facility];  Service: Gastroenterology;;    Allergies: Amoxicillin; Buprenorphine hcl; and Morphine and related  Medications: Prior to Admission medications   Medication Sig Start Date End Date Taking? Authorizing Provider  albuterol (PROVENTIL HFA;VENTOLIN HFA) 108 (90 Base) MCG/ACT inhaler Inhale 2 puffs into the lungs every 6 (six) hours as needed for wheezing or shortness of breath.    [provider]  AMITIZA 24 MCG capsule Take 24 mcg by mouth daily with breakfast.  04/27/18   [provider]  diazepam (VALIUM) 5 MG tablet take 1 tablet BY MOUTH THREE TIMES DAILY AS NEEDED Patient taking differently: Take 5 mg by mouth every 8 (eight) hours as needed for anxiety (pain). take 1 tablet BY MOUTH THREE TIMES DAILY AS NEEDED 04/27/18   [name]  B, MD  hydrochlorothiazide (HYDRODIURIL) 12.5 MG tablet Take 1 tablet (12.5 mg total) by mouth daily. Patient not taking: Reported on 04/27/2018 04/06/18   Roxan Hockey, MD  hydrochlorothiazide (HYDRODIURIL) 25 MG tablet Take 25 mg by mouth daily.  04/27/18   [provider]  HYDROmorphone (DILAUDID) 4 MG tablet Take 1 tablet (4 mg total) by mouth every 4 (four) hours as needed for severe pain. 05/09/18   Milton Ferguson, MD  ibuprofen (ADVIL,MOTRIN) 400 MG tablet Take 2 tablets (800 mg total) by mouth every 6 (six) hours as needed for headache or moderate pain. 05/04/18   Tresa Garter, MD  insulin NPH-regular Human (NOVOLIN 70/30) (70-30) 100 UNIT/ML injection Inject 35-36 Units into the skin See admin instructions. Inject 35 units subcutaneously after breakfast and 36 units after supper    [provider]  metFORMIN (GLUCOPHAGE) 1000 MG tablet Take 1,000 mg by mouth daily with breakfast.  04/27/18   [provider]  ondansetron (ZOFRAN ODT) 4 MG disintegrating tablet 4mg  ODT q4 hours prn nausea/vomit 05/09/18   Milton Ferguson, MD  oxycodone (ROXICODONE) 30 MG immediate release tablet Take 1 tablet (30 mg total) by mouth every 6 (six) hours as needed for pain. 05/04/18   Tresa Garter, MD  Prenat w/o A Vit-FeFum-FePo-FA (CONCEPT OB) 130-92.4-1 MG CAPS Take 1 capsule by mouth daily. Patient not taking: Reported on 11/15/2017 08/09/17   [name], MD  sitaGLIPtin (JANUVIA) 100 MG tablet Take 100 mg by mouth daily.    [provider]     Family History  Problem Relation Age of Onset  . Diabetes Mother   . Hypertension Mother   . Breast cancer Maternal Aunt   . Ovarian cancer Maternal Grandmother   . Breast cancer Maternal Aunt   . Migraines Neg Hx     Social History   Socioeconomic History  . Marital status: Single    Spouse name: Not on file  . Number of children: 3  . Years of education: 82  . Highest education level: Not on file  Occupational History  . Occupation: Unemployed   Social Needs  . Financial resource strain: Not on file  . Food insecurity:    Worry: Not on file    Inability: Not on file  . Transportation needs:    Medical: Not on file    Non-medical: Not on file  Tobacco Use  . Smoking status: Former Smoker    Years: 0.00  . Smokeless tobacco: Never Used  . Tobacco comment: 03/27/2014 "smoked ~ 1 cigarette/day; quit in ~ 2013"    Substance and Sexual Activity  . Alcohol use: Yes    Alcohol/week: 0.0 oz  . Drug use: No  . Sexual activity: Yes    Birth control/protection: None  Lifestyle  . Physical activity:    Days per week: Not on file    Minutes per session: Not on file  . Stress: Not on file  Relationships  . Social connections:    Talks on phone: Not on file    Gets together: Not on file    Attends religious service: Not on file    Active member of club or organization: Not on file    Attends meetings of clubs or organizations: Not on file    Relationship status: Not on file  Other Topics Concern  . Not on file  Social History Narrative   Patient lives at home with boyfriend.    Patient has 3 children  Patient does not work    Patient has an 11th grade education    Patient is right handed      Vital Signs: BP 140/80   Pulse 96   Temp 98.3 F (36.8 C)   LMP 05/02/2018 Comment: negative hCG 05/09/18  SpO2 100%   Physical Exam  Abdominal: Soft. Bowel sounds are normal.  Skin: Skin is warm and dry.  drain injection shows no communication to bile ducts; NO leak per Dr Valli Glance removed without difficulty Dressing placed  Vitals reviewed.   Imaging: Ir Radiologist Eval & Mgmt  Result Date: 05/19/2018 Please refer to notes tab for details about interventional procedure. (Op Note)   Labs:  CBC: Recent Labs    05/01/18 0530 05/02/18 0558 05/04/18 0552 05/09/18 2008  WBC 8.7 9.3 10.5 10.7*  HGB 9.9* 9.4* 8.6* 9.5*  HCT 31.1* 29.7* 27.7* 30.1*  PLT 368 315 351 470*    COAGS: Recent Labs    04/01/18 1914 04/28/18 0636  INR 1.06 1.02  APTT 20*  --     BMP: Recent Labs    05/02/18 0558 05/03/18 0557 05/04/18 0552 05/09/18 2008  NA 135 135 137 139  K 3.5 3.9 3.9 3.4*  CL 102 100* 100* 103  CO2 22 26 28 25   GLUCOSE 134* 297* 224* 316*  BUN 6 12 14 10   CALCIUM 8.4* 8.3* 8.5* 8.6*  CREATININE 0.66 1.08* 0.86 0.81  GFRNONAA >60 >60 >60 >60  GFRAA >60 >60 >60  >60    LIVER FUNCTION TESTS: Recent Labs    05/02/18 0558 05/03/18 0557 05/04/18 0552 05/09/18 2008  BILITOT 1.2 0.7 0.8 0.7  AST 187* 123* 72* 21  ALT 384* 299* 232* 55*  ALKPHOS 362* 310* 279* 196*  PROT 7.1 6.6 6.9 8.0  ALBUMIN 2.8* 2.8* 2.8* 3.4*    Assessment:  GB fossa collection post cholecystectomy 04/03/18 Biliary stent placed 05/02/18 GB fossa collection drain placed 6/2 in IR Drain removed today after drain injection revealed no leak Pt tolerated well  Signed: Shivani Barrantes A, PA-C 05/19/2018, 1:43 PM   Please refer to Dr. Barbie Banner attestation of this note for management and plan.

## 2018-06-06 ENCOUNTER — Ambulatory Visit: Payer: Self-pay | Admitting: Physician Assistant

## 2018-06-17 ENCOUNTER — Ambulatory Visit: Payer: Self-pay | Admitting: Physician Assistant

## 2018-07-07 ENCOUNTER — Encounter: Payer: Self-pay | Admitting: Physician Assistant

## 2018-07-07 ENCOUNTER — Other Ambulatory Visit (INDEPENDENT_AMBULATORY_CARE_PROVIDER_SITE_OTHER): Payer: Medicare Other

## 2018-07-07 ENCOUNTER — Ambulatory Visit (INDEPENDENT_AMBULATORY_CARE_PROVIDER_SITE_OTHER): Payer: Medicare Other | Admitting: Physician Assistant

## 2018-07-07 ENCOUNTER — Encounter (INDEPENDENT_AMBULATORY_CARE_PROVIDER_SITE_OTHER): Payer: Self-pay

## 2018-07-07 VITALS — BP 110/76 | HR 100 | Ht 63.0 in | Wt 314.0 lb

## 2018-07-07 DIAGNOSIS — T85590A Other mechanical complication of bile duct prosthesis, initial encounter: Secondary | ICD-10-CM

## 2018-07-07 DIAGNOSIS — R112 Nausea with vomiting, unspecified: Secondary | ICD-10-CM

## 2018-07-07 DIAGNOSIS — K838 Other specified diseases of biliary tract: Secondary | ICD-10-CM

## 2018-07-07 DIAGNOSIS — K9189 Other postprocedural complications and disorders of digestive system: Secondary | ICD-10-CM

## 2018-07-07 DIAGNOSIS — K8050 Calculus of bile duct without cholangitis or cholecystitis without obstruction: Secondary | ICD-10-CM

## 2018-07-07 DIAGNOSIS — R197 Diarrhea, unspecified: Secondary | ICD-10-CM | POA: Diagnosis not present

## 2018-07-07 DIAGNOSIS — R1011 Right upper quadrant pain: Secondary | ICD-10-CM | POA: Diagnosis not present

## 2018-07-07 LAB — COMPREHENSIVE METABOLIC PANEL
ALT: 7 U/L (ref 0–35)
AST: 9 U/L (ref 0–37)
Albumin: 3.7 g/dL (ref 3.5–5.2)
Alkaline Phosphatase: 88 U/L (ref 39–117)
BUN: 14 mg/dL (ref 6–23)
CO2: 27 mEq/L (ref 19–32)
Calcium: 9.4 mg/dL (ref 8.4–10.5)
Chloride: 102 mEq/L (ref 96–112)
Creatinine, Ser: 0.9 mg/dL (ref 0.40–1.20)
GFR: 89.46 mL/min (ref >60.00)
Glucose, Bld: 227 mg/dL — ABNORMAL HIGH (ref 70–99)
Potassium: 4.3 mEq/L (ref 3.5–5.1)
Sodium: 137 mEq/L (ref 135–145)
Total Bilirubin: 0.3 mg/dL (ref 0.2–1.2)
Total Protein: 8.1 g/dL (ref 6.0–8.3)

## 2018-07-07 LAB — CBC WITH DIFFERENTIAL/PLATELET
Basophils Absolute: 0.1 10*3/uL (ref 0.0–0.1)
Basophils Relative: 0.8 % (ref 0.0–3.0)
Eosinophils Absolute: 0 10*3/uL (ref 0.0–0.7)
Eosinophils Relative: 0.5 % (ref 0.0–5.0)
HCT: 29.6 % — ABNORMAL LOW (ref 36.0–46.0)
Hemoglobin: 9.4 g/dL — ABNORMAL LOW (ref 12.0–15.0)
Lymphocytes Relative: 30.2 % (ref 12.0–46.0)
Lymphs Abs: 2.7 10*3/uL (ref 0.7–4.0)
MCHC: 31.7 g/dL (ref 30.0–36.0)
MCV: 64.3 fl — ABNORMAL LOW (ref 78.0–100.0)
Monocytes Absolute: 0.4 10*3/uL (ref 0.1–1.0)
Monocytes Relative: 4.3 % (ref 3.0–12.0)
Neutro Abs: 5.7 10*3/uL (ref 1.4–7.7)
Neutrophils Relative %: 64.2 % (ref 43.0–77.0)
Platelets: 495 10*3/uL — ABNORMAL HIGH (ref 150.0–400.0)
RBC: 4.6 Mil/uL (ref 3.87–5.11)
RDW: 21.2 % — ABNORMAL HIGH (ref 11.5–15.5)
WBC: 8.9 10*3/uL (ref 4.0–10.5)

## 2018-07-07 MED ORDER — CHOLESTYRAMINE 4 G PO PACK: 4.0000 g | PACK | Freq: Three times a day (TID) | ORAL | 11 refills | Status: DC

## 2018-07-07 NOTE — Progress Notes (Signed)
Subjective:    Patient ID: Cynthia Rosales, female    DOB: Feb 27, 1981, 37 y.o.   MRN: [id number]  HPI Cynthia Rosales is a pleasant 37 year old white female, known to Dr. Henrene Pastor from recent hospitalization who comes in today for follow-up. 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 Patient underwent cholecystectomy 04/03/2018 for symptomatic cholelithiasis.  She was readmitted on 04/30/2018 with acute abdominal pain and elevated LFTs. Work-up including MRCP with finding of multiple common bile duct stones and a biloma measuring 4.5 x 5.4 cm. She ultimately underwent ERCP with Dr. Loletha Carrow on 05/02/2018.  She had balloon extraction of the common bile duct stones, sphincterotomy, she had a stent placed in the ventral pancreatic duct and had a 10 [place] by 7 cm common bile duct stent placed as well.  She was noted to have a bile leak at the time of ERCP. She had IR drainage of the biloma done during her hospitalization and was discharged home with drainage tube. She had follow-up appointment here on 06/06/2018 but did not show. Her notes she was seen outpatient by IR on 05/19/2018 with drain removal. Last imaging study was done on 05/09/2018-percutaneous drain in the gallbladder fossa was noted without reaccumulation of fluid, common bile duct stent present in appropriate position, pneumobilia from prior intervention was seen no abscess or free-flowing fluid and pancreatic duct stent not present.  Patient says she has been doing okay since hospitalization.  She has developed problems with diarrhea.  Again she has been incontinent of stool over the past couple of years but says now she is having 2-3 very loose bowel movements per day which is very different than usual.  Her appetite is still not good.  She denies any vomiting.  No fever or  chills.  She has also been having intermittent sharp stabbing right upper quadrant pain which she says comes and goes throughout the day and is transient.  This seems to be associated with a wave of nausea which is short-lived.  Patient is narcotic dependent, on Dilaudid and Roxicodone.  She has a previous prescription for Amitiza which she is not taking.  Review of Systems Pertinent positive and negative review of systems were noted in the above HPI section.  All other review of systems was otherwise negative.  Outpatient Encounter Medications as of 07/07/2018  Medication Sig  . albuterol (PROVENTIL HFA;VENTOLIN HFA) 108 (90 Base) MCG/ACT inhaler Inhale 2 puffs into the lungs every 6 (six) hours as needed for wheezing or shortness of breath.  . AMITIZA 24 MCG capsule Take 24 mcg by mouth daily with breakfast.   . diazepam (VALIUM) 5 MG tablet take 1 tablet BY MOUTH THREE TIMES DAILY AS NEEDED (Patient taking differently: Take 5 mg by mouth every 8 (eight) hours as needed for anxiety (pain). take 1 tablet BY MOUTH THREE TIMES DAILY AS NEEDED)  . hydrochlorothiazide (HYDRODIURIL) 25 MG tablet Take 25 mg by mouth daily.   Marland Kitchen HYDROmorphone (DILAUDID) 4 MG tablet Take 1 tablet (4 mg total) by mouth every 4 (four) hours as needed for severe pain.  Marland Kitchen ibuprofen (ADVIL,MOTRIN) 400 MG tablet Take 2 tablets (800 mg total) by mouth every 6 (six) hours as needed for headache or moderate pain.  Marland Kitchen insulin NPH-regular Human (NOVOLIN 70/30) (70-30) 100 UNIT/ML injection Inject 35-36 Units into the skin See admin instructions.  Inject 35 units subcutaneously after breakfast and 36 units after supper  . metFORMIN (GLUCOPHAGE) 1000 MG tablet Take 1,000 mg by mouth daily with breakfast.   . oxycodone (ROXICODONE) 30 MG immediate release tablet Take 1 tablet (30 mg total) by mouth every 6 (six) hours as needed for pain.  . sitaGLIPtin (JANUVIA) 100 MG tablet Take 100 mg by mouth daily.  . cholestyramine (QUESTRAN) 4 g  packet Take 1 packet (4 g total) by mouth 3 (three) times daily with meals.  . [DISCONTINUED] hydrochlorothiazide (HYDRODIURIL) 12.5 MG tablet Take 1 tablet (12.5 mg total) by mouth daily. (Patient not taking: Reported on 04/27/2018)  . [DISCONTINUED] ondansetron (ZOFRAN ODT) 4 MG disintegrating tablet 34m ODT q4 hours prn nausea/vomit  . [DISCONTINUED] Prenat w/o A Vit-FeFum-FePo-FA (CONCEPT OB) 130-92.4-1 MG CAPS Take 1 capsule by mouth daily. (Patient not taking: Reported on 11/15/2017)   No facility-administered encounter medications on file as of 07/07/2018.    Allergies  Allergen Reactions  . Amoxicillin Hives, Rash and Other (See Comments)    PATIENT HAS HAD A PCN REACTION WITH IMMEDIATE RASH, FACIAL/TONGUE/THROAT SWELLING, SOB, OR LIGHTHEADEDNESS WITH HYPOTENSION:  #  #  YES  #  #  Has patient had a PCN reaction causing severe rash involving mucus membranes or skin necrosis: No Has patient had a PCN reaction that required hospitalization: No  Has patient had a PCN reaction occurring within the last 10 years: #  #  #  YES  #  #  #  If all of the above answers are "NO", then may proceed with Cephalosporin use.    . Buprenorphine Hcl Itching and Nausea Only  . Morphine And Related Itching and Nausea Only   Patient Active Problem List   Diagnosis Date Noted  . Bile leak, postoperative   . Choledocholithiasis   . Intra-abdominal fluid collection   . Anemia 04/28/2018  . Morbid obesity with body mass index (BMI) of 50.0 to 59.9 in adult ([facility] 04/02/2018  . Mass of uterus 04/02/2018  . Transaminitis 04/02/2018  . Acute cholecystitis s/p lap cholecystectomy 04/03/2018 04/01/2018  . Pregnancy, ectopic, cornual or cervical 07/21/2017  . 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  . Neurogenic bladder 08/23/2015  . Bilateral carpal tunnel syndrome 08/23/2015  . Paraparesis of both lower limbs ([facility] 07/14/2015  . Ileus, postoperative ([facility] 12/25/2014  . Sickle cell disease without crisis ([facility] 12/25/2014   . Diabetes mellitus type 2 in obese ([facility] 12/25/2014  . 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[facility] 12/24/2014  . 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[facility] 12/24/2014  . Ileus of unspecified type ([facility]   . Atelectasis   . Bowel obstruction ([facility]   . Encounter for central line care   . Ileus ([facility]   . Intestinal occlusion ([facility])   . 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  . Headache 11/19/2014  . Neck pain 11/19/2014  . Right sided weakness 11/19/2014  . Paresthesias 11/19/2014  . Nightmares 09/11/2014  . HTN (hypertension), benign 03/28/2014  . Nephrolithiasis 03/27/2014  . Diabetes mellitus ([facility] 03/27/2014  . Abdominal pain 03/27/2014   Social History   Socioeconomic History  . Marital status: Single    Spouse name: Not on file  . Number of children: 3  . Years of education: 110 . Highest education level: Not on file  Occupational History  . Occupation: Unemployed   Social Needs  . Financial resource strain: Not on file  . Food insecurity:    Worry: Not on file  Inability: Not on file  . Transportation needs:    Medical: Not on file    Non-medical: Not on file  Tobacco Use  . Smoking status: Former Smoker    Years: 0.00  . Smokeless tobacco: Never Used  . Tobacco comment: 03/27/2014 "smoked ~ 1 cigarette/day; quit in ~ 2013"  Substance and Sexual Activity  . Alcohol use: Never    Alcohol/week: 0.0 standard drinks    Frequency: Never  . Drug use: No  . Sexual activity: Yes    Birth control/protection: None  Lifestyle  . Physical activity:    Days per week: Not on file    Minutes per session: Not on file  . Stress: Not on file  Relationships  . Social connections:    Talks on phone: Not on file    Gets together: Not on file    Attends religious service: Not on file    Active member of club or organization: Not on file    Attends meetings of clubs or organizations: Not on file    Relationship status: Not on file  . Intimate partner violence:    Fear of  current or ex partner: Not on file    Emotionally abused: Not on file    Physically abused: Not on file    Forced sexual activity: Not on file  Other Topics Concern  . Not on file  Social History Narrative   Patient lives at home with boyfriend.    Patient has 3 children    Patient does not work    Patient has an 11th grade education    Patient is right handed     Ms. Stepter's family history includes Breast cancer in her maternal aunt and maternal aunt; Diabetes in her mother; Hypertension in her mother; Ovarian cancer in her maternal grandmother.      Objective:    Vitals:   07/07/18 1027  BP: 110/76  Pulse: 100    Physical Exam; well-developed African-American female in no acute distress, pleasant, height 5 foot 3, weight 314, BMI 55.6.  She ambulates with a cane.  HEENT; nontraumatic normocephalic EOMI PERRLA sclera anicteric, Oropharynx ;benign.  Cardiovascular; regular rate and rhythm with S1-S2 no murmur rub or gallop, Pulmonary; clear bilaterally Abdomen ;morbidly obese, soft bowel sounds are present some mild tenderness in the right upper quadrant there is no guarding or rebound no mass or hepatosplenomegaly.  Rectal ;exam not done, Extremities; no clubbing cyanosis or edema skin warm dry,  neuro psych; alert and oriented, grossly nonfocal mood and affect appropriate       Assessment & Plan:   #30 37 year old African-American female status post cholecystectomy 0/01/4096, complicated by readmission 04/30/2018 with choledocholithiasis and bile leak with biloma. Patient is status post ERCP with stone extraction sphincterotomy and stent placement for bile leak on 05/02/2018. Also status post percutaneous drainage of biloma-drainage tube was removed on 05/19/2018  Patient continues to have transient sharp right upper quadrant pain associated with nausea for the past several weeks and decrease in appetite. Etiology not clear  #2 narcotic dependence #3.  Sickle cell disease #4.   Morbid obesity #5.  Adult onset diabetes mellitus  #6 hypertension #7.  History of spinal cord tumor with partial paralysis and chronic bowel and bladder incontinence #8 new diarrhea post cholecystectomy- likely bile salt induced  Plan; CBC with differential, C met, Will schedule for CT of the abdomen and pelvis with contrast Start Questran 4 g once daily at least 2 hours away  from other meds  Patient needs EGD with stent removal with Dr. Henrene Pastor, to be scheduled at the hospital.  Will discuss with Dr. Henrene Pastor regarding timing, scheduling.. The patient is aware we will be calling her with that appointment.    Jorja Empie S Oskar Cretella PA-C 07/07/2018   Cc: Medicine, [facility]*

## 2018-07-07 NOTE — Progress Notes (Signed)
SHOULD BE ERCP WITH GENERAL ANESTHESIA (not EGD). Please set up during my upcoming hospital week, either hospital (not on a Monday). Thanks

## 2018-07-07 NOTE — Patient Instructions (Addendum)
Your provider has requested that you go to the basement level for lab work before leaving today. Press "B" on the elevator. The lab is located at the first door on the left as you exit the elevator.  We sent a prescription to [facility]. 1. Questran 4 gm packets.  Take this medication 2 hours away from other medications.    You have been scheduled for a CT scan of the abdomen and pelvis at [facility] ([address]---this is in the same building as [facility]).   You are scheduled on Friday 07-15-18 at 3:30 PM. You should arrive 15 minutes prior to your appointment time for registration. Please follow the written instructions below on the day of your exam:  WARNING: IF YOU ARE ALLERGIC TO IODINE/X-RAY DYE, PLEASE NOTIFY RADIOLOGY IMMEDIATELY AT [phone]! YOU WILL BE GIVEN A 13 HOUR PREMEDICATION PREP.  1) Do not eat  anything after 11:30 a m (4 hours prior to your test) 2) You have been given 2 bottles of oral contrast to drink. The solution may taste better if refrigerated, but do NOT add ice or any other liquid to this solution. Shake well before drinking.    Drink 1 bottle of contrast @ 1;30 PM (2 hours prior to your exam)  Drink 1 bottle of contrast @ 2:30 PM  (1 hour prior to your exam)  You may take any medications as prescribed with a small amount of water except for the following: Metformin, Glucophage, Glucovance, Avandamet, Riomet, Fortamet, Actoplus Met, Janumet, Glumetza or Metaglip. The above medications must be held the day of the exam AND 48 hours after the exam.  The purpose of you drinking the oral contrast is to aid in the visualization of your intestinal tract. The contrast solution may cause some diarrhea. Before your exam is started, you will be given a small amount of fluid to drink. Depending on your individual set of symptoms, you may also receive an intravenous injection of x-ray contrast/dye. Plan on being at [facility] for 30  minutes or long, depending on the type of exam you are having performed.  If you have any questions regarding your exam or if you need to reschedule, you may call the CT department at [phone] between the hours of 8:00 am and 5:00 pm, Monday-Friday.  ________________________________________________________________________

## 2018-07-07 NOTE — H&P (View-Only) (Signed)
 Subjective:    Patient ID: Cynthia Rosales, female    DOB: 10/10/1981, 37 y.o.   MRN: [id number]  HPI Cynthia Rosales is a pleasant 37-year-old white female, known to Dr. Perry from recent hospitalization who comes in today for follow-up. 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 Patient underwent cholecystectomy 04/03/2018 for symptomatic cholelithiasis.  She was readmitted on 04/30/2018 with acute abdominal pain and elevated LFTs. Work-up including MRCP with finding of multiple common bile duct stones and a biloma measuring 4.5 x 5.4 cm. She ultimately underwent ERCP with Dr. Danis on 05/02/2018.  She had balloon extraction of the common bile duct stones, sphincterotomy, she had a stent placed in the ventral pancreatic duct and had a 10 French by 7 cm common bile duct stent placed as well.  She was noted to have a bile leak at the time of ERCP. She had IR drainage of the biloma done during her hospitalization and was discharged home with drainage tube. She had follow-up appointment here on 06/06/2018 but did not show. Her notes she was seen outpatient by IR on 05/19/2018 with drain removal. Last imaging study was done on 05/09/2018-percutaneous drain in the gallbladder fossa was noted without reaccumulation of fluid, common bile duct stent present in appropriate position, pneumobilia from prior intervention was seen no abscess or free-flowing fluid and pancreatic duct stent not present.  Patient says she has been doing okay since hospitalization.  She has developed problems with diarrhea.  Again she has been incontinent of stool over the past couple of years but says now she is having 2-3 very loose bowel movements per day which is very different than usual.  Her appetite is still not good.  She denies any vomiting.  No fever or  chills.  She has also been having intermittent sharp stabbing right upper quadrant pain which she says comes and goes throughout the day and is transient.  This seems to be associated with a wave of nausea which is short-lived.  Patient is narcotic dependent, on Dilaudid and Roxicodone.  She has a previous prescription for Amitiza which she is not taking.  Review of Systems Pertinent positive and negative review of systems were noted in the above HPI section.  All other review of systems was otherwise negative.  Outpatient Encounter Medications as of 07/07/2018  Medication Sig  . albuterol (PROVENTIL HFA;VENTOLIN HFA) 108 (90 Base) MCG/ACT inhaler Inhale 2 puffs into the lungs every 6 (six) hours as needed for wheezing or shortness of breath.  . AMITIZA 24 MCG capsule Take 24 mcg by mouth daily with breakfast.   . diazepam (VALIUM) 5 MG tablet take 1 tablet BY MOUTH THREE TIMES DAILY AS NEEDED (Patient taking differently: Take 5 mg by mouth every 8 (eight) hours as needed for anxiety (pain). take 1 tablet BY MOUTH THREE TIMES DAILY AS NEEDED)  . hydrochlorothiazide (HYDRODIURIL) 25 MG tablet Take 25 mg by mouth daily.   . HYDROmorphone (DILAUDID) 4 MG tablet Take 1 tablet (4 mg total) by mouth every 4 (four) hours as needed for severe pain.  . ibuprofen (ADVIL,MOTRIN) 400 MG tablet Take 2 tablets (800 mg total) by mouth every 6 (six) hours as needed for headache or moderate pain.  . insulin NPH-regular Human (NOVOLIN 70/30) (70-30) 100 UNIT/ML injection Inject 35-36 Units into the skin See admin instructions.   Inject 35 units subcutaneously after breakfast and 36 units after supper  . metFORMIN (GLUCOPHAGE) 1000 MG tablet Take 1,000 mg by mouth daily with breakfast.   . oxycodone (ROXICODONE) 30 MG immediate release tablet Take 1 tablet (30 mg total) by mouth every 6 (six) hours as needed for pain.  . sitaGLIPtin (JANUVIA) 100 MG tablet Take 100 mg by mouth daily.  . cholestyramine (QUESTRAN) 4 g  packet Take 1 packet (4 g total) by mouth 3 (three) times daily with meals.  . [DISCONTINUED] hydrochlorothiazide (HYDRODIURIL) 12.5 MG tablet Take 1 tablet (12.5 mg total) by mouth daily. (Patient not taking: Reported on 04/27/2018)  . [DISCONTINUED] ondansetron (ZOFRAN ODT) 4 MG disintegrating tablet 4mg ODT q4 hours prn nausea/vomit  . [DISCONTINUED] Prenat w/o A Vit-FeFum-FePo-FA (CONCEPT OB) 130-92.4-1 MG CAPS Take 1 capsule by mouth daily. (Patient not taking: Reported on 11/15/2017)   No facility-administered encounter medications on file as of 07/07/2018.    Allergies  Allergen Reactions  . Amoxicillin Hives, Rash and Other (See Comments)    PATIENT HAS HAD A PCN REACTION WITH IMMEDIATE RASH, FACIAL/TONGUE/THROAT SWELLING, SOB, OR LIGHTHEADEDNESS WITH HYPOTENSION:  #  #  YES  #  #  Has patient had a PCN reaction causing severe rash involving mucus membranes or skin necrosis: No Has patient had a PCN reaction that required hospitalization: No  Has patient had a PCN reaction occurring within the last 10 years: #  #  #  YES  #  #  #  If all of the above answers are "NO", then may proceed with Cephalosporin use.    . Buprenorphine Hcl Itching and Nausea Only  . Morphine And Related Itching and Nausea Only   Patient Active Problem List   Diagnosis Date Noted  . Bile leak, postoperative   . Choledocholithiasis   . Intra-abdominal fluid collection   . Anemia 04/28/2018  . Morbid obesity with body mass index (BMI) of 50.0 to 59.9 in adult (HCC) 04/02/2018  . Mass of uterus 04/02/2018  . Transaminitis 04/02/2018  . Acute cholecystitis s/p lap cholecystectomy 04/03/2018 04/01/2018  . Pregnancy, ectopic, cornual or cervical 07/21/2017  . 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  . Neurogenic bladder 08/23/2015  . Bilateral carpal tunnel syndrome 08/23/2015  . Paraparesis of both lower limbs (HCC) 07/14/2015  . Ileus, postoperative (HCC) 12/25/2014  . Sickle cell disease without crisis (HCC) 12/25/2014   . Diabetes mellitus type 2 in obese (HCC) 12/25/2014  . 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HCC) 12/24/2014  . 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  . Ileus of unspecified type (HCC)   . Atelectasis   . Bowel obstruction (HCC)   . Encounter for central line care   . Ileus (HCC)   . Intestinal occlusion (HCC)   . 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  . Headache 11/19/2014  . Neck pain 11/19/2014  . Right sided weakness 11/19/2014  . Paresthesias 11/19/2014  . Nightmares 09/11/2014  . HTN (hypertension), benign 03/28/2014  . Nephrolithiasis 03/27/2014  . Diabetes mellitus (HCC) 03/27/2014  . Abdominal pain 03/27/2014   Social History   Socioeconomic History  . Marital status: Single    Spouse name: Not on file  . Number of children: 3  . Years of education: 11  . Highest education level: Not on file  Occupational History  . Occupation: Unemployed   Social Needs  . Financial resource strain: Not on file  . Food insecurity:    Worry: Not on file      Inability: Not on file  . Transportation needs:    Medical: Not on file    Non-medical: Not on file  Tobacco Use  . Smoking status: Former Smoker    Years: 0.00  . Smokeless tobacco: Never Used  . Tobacco comment: 03/27/2014 "smoked ~ 1 cigarette/day; quit in ~ 2013"  Substance and Sexual Activity  . Alcohol use: Never    Alcohol/week: 0.0 standard drinks    Frequency: Never  . Drug use: No  . Sexual activity: Yes    Birth control/protection: None  Lifestyle  . Physical activity:    Days per week: Not on file    Minutes per session: Not on file  . Stress: Not on file  Relationships  . Social connections:    Talks on phone: Not on file    Gets together: Not on file    Attends religious service: Not on file    Active member of club or organization: Not on file    Attends meetings of clubs or organizations: Not on file    Relationship status: Not on file  . Intimate partner violence:    Fear of  current or ex partner: Not on file    Emotionally abused: Not on file    Physically abused: Not on file    Forced sexual activity: Not on file  Other Topics Concern  . Not on file  Social History Narrative   Patient lives at home with boyfriend.    Patient has 3 children    Patient does not work    Patient has an 11th grade education    Patient is right handed     Ms. Eduardo's family history includes Breast cancer in her maternal aunt and maternal aunt; Diabetes in her mother; Hypertension in her mother; Ovarian cancer in her maternal grandmother.      Objective:    Vitals:   07/07/18 1027  BP: 110/76  Pulse: 100    Physical Exam; well-developed African-American female in no acute distress, pleasant, height 5 foot 3, weight 314, BMI 55.6.  She ambulates with a cane.  HEENT; nontraumatic normocephalic EOMI PERRLA sclera anicteric, Oropharynx ;benign.  Cardiovascular; regular rate and rhythm with S1-S2 no murmur rub or gallop, Pulmonary; clear bilaterally Abdomen ;morbidly obese, soft bowel sounds are present some mild tenderness in the right upper quadrant there is no guarding or rebound no mass or hepatosplenomegaly.  Rectal ;exam not done, Extremities; no clubbing cyanosis or edema skin warm dry,  neuro psych; alert and oriented, grossly nonfocal mood and affect appropriate       Assessment & Plan:   #1 37-year-old African-American female status post cholecystectomy 04/03/2018, complicated by readmission 04/30/2018 with choledocholithiasis and bile leak with biloma. Patient is status post ERCP with stone extraction sphincterotomy and stent placement for bile leak on 05/02/2018. Also status post percutaneous drainage of biloma-drainage tube was removed on 05/19/2018  Patient continues to have transient sharp right upper quadrant pain associated with nausea for the past several weeks and decrease in appetite. Etiology not clear  #2 narcotic dependence #3.  Sickle cell disease #4.   Morbid obesity #5.  Adult onset diabetes mellitus  #6 hypertension #7.  History of spinal cord tumor with partial paralysis and chronic bowel and bladder incontinence #8 new diarrhea post cholecystectomy- likely bile salt induced  Plan; CBC with differential, C met, Will schedule for CT of the abdomen and pelvis with contrast Start Questran 4 g once daily at least 2 hours away   from other meds  Patient needs EGD with stent removal with Dr. Henrene Pastor, to be scheduled at the hospital.  Will discuss with Dr. Henrene Pastor regarding timing, scheduling.. The patient is aware we will be calling her with that appointment.    Amy S Esterwood PA-C 07/07/2018   Cc: Medicine, [facility]

## 2018-07-08 ENCOUNTER — Other Ambulatory Visit: Payer: Self-pay

## 2018-07-08 DIAGNOSIS — K9189 Other postprocedural complications and disorders of digestive system: Principal | ICD-10-CM

## 2018-07-08 DIAGNOSIS — K838 Other specified diseases of biliary tract: Secondary | ICD-10-CM

## 2018-07-15 ENCOUNTER — Ambulatory Visit (INDEPENDENT_AMBULATORY_CARE_PROVIDER_SITE_OTHER)
Admission: RE | Admit: 2018-07-15 | Discharge: 2018-07-15 | Disposition: A | Discharge status: Home / Self Care | Payer: Medicare Other | Source: Ambulatory Visit | Attending: Physician Assistant | Admitting: Physician Assistant

## 2018-07-15 DIAGNOSIS — R1011 Right upper quadrant pain: Secondary | ICD-10-CM

## 2018-07-15 DIAGNOSIS — R197 Diarrhea, unspecified: Secondary | ICD-10-CM

## 2018-07-15 DIAGNOSIS — R112 Nausea with vomiting, unspecified: Secondary | ICD-10-CM

## 2018-07-15 MED ORDER — IOPAMIDOL (ISOVUE-300) INJECTION 61%
100.0000 mL | Freq: Once | INTRAVENOUS | Status: AC | PRN
  Administered 2018-07-15: 100 mL via INTRAVENOUS

## 2018-07-25 ENCOUNTER — Other Ambulatory Visit: Payer: Self-pay | Admitting: Neurosurgery

## 2018-07-25 DIAGNOSIS — C719 Malignant neoplasm of brain, unspecified: Secondary | ICD-10-CM

## 2018-07-26 ENCOUNTER — Ambulatory Visit (HOSPITAL_COMMUNITY): Payer: Medicare Other

## 2018-07-26 ENCOUNTER — Encounter (HOSPITAL_COMMUNITY)
Admission: RE | Disposition: A | Discharge status: Home / Self Care | Payer: Self-pay | Source: Ambulatory Visit | Attending: Internal Medicine

## 2018-07-26 ENCOUNTER — Other Ambulatory Visit: Payer: Self-pay

## 2018-07-26 ENCOUNTER — Encounter (HOSPITAL_COMMUNITY): Payer: Self-pay

## 2018-07-26 ENCOUNTER — Ambulatory Visit (HOSPITAL_COMMUNITY)
Admission: RE | Admit: 2018-07-26 | Discharge: 2018-07-26 | Disposition: A | Discharge status: Home / Self Care | Payer: Medicare Other | Source: Ambulatory Visit | Attending: Internal Medicine | Admitting: Internal Medicine

## 2018-07-26 ENCOUNTER — Ambulatory Visit (HOSPITAL_COMMUNITY): Payer: Medicare Other | Admitting: Certified Registered Nurse Anesthetist

## 2018-07-26 DIAGNOSIS — Z87891 Personal history of nicotine dependence: Secondary | ICD-10-CM | POA: Diagnosis not present

## 2018-07-26 DIAGNOSIS — K838 Other specified diseases of biliary tract: Secondary | ICD-10-CM

## 2018-07-26 DIAGNOSIS — Z794 Long term (current) use of insulin: Secondary | ICD-10-CM | POA: Diagnosis not present

## 2018-07-26 DIAGNOSIS — I10 Essential (primary) hypertension: Secondary | ICD-10-CM | POA: Diagnosis not present

## 2018-07-26 DIAGNOSIS — Z4659 Encounter for fitting and adjustment of other gastrointestinal appliance and device: Secondary | ICD-10-CM | POA: Insufficient documentation

## 2018-07-26 DIAGNOSIS — G8220 Paraplegia, unspecified: Secondary | ICD-10-CM | POA: Diagnosis not present

## 2018-07-26 DIAGNOSIS — K9189 Other postprocedural complications and disorders of digestive system: Secondary | ICD-10-CM

## 2018-07-26 DIAGNOSIS — Z6841 Body Mass Index (BMI) 40.0 and over, adult: Secondary | ICD-10-CM | POA: Insufficient documentation

## 2018-07-26 DIAGNOSIS — E119 Type 2 diabetes mellitus without complications: Secondary | ICD-10-CM | POA: Insufficient documentation

## 2018-07-26 DIAGNOSIS — F1120 Opioid dependence, uncomplicated: Secondary | ICD-10-CM | POA: Diagnosis not present

## 2018-07-26 DIAGNOSIS — R197 Diarrhea, unspecified: Secondary | ICD-10-CM | POA: Diagnosis not present

## 2018-07-26 DIAGNOSIS — D571 Sickle-cell disease without crisis: Secondary | ICD-10-CM | POA: Diagnosis not present

## 2018-07-26 DIAGNOSIS — Z79899 Other long term (current) drug therapy: Secondary | ICD-10-CM | POA: Insufficient documentation

## 2018-07-26 DIAGNOSIS — N319 Neuromuscular dysfunction of bladder, unspecified: Secondary | ICD-10-CM | POA: Diagnosis not present

## 2018-07-26 DIAGNOSIS — Z9049 Acquired absence of other specified parts of digestive tract: Secondary | ICD-10-CM | POA: Diagnosis not present

## 2018-07-26 DIAGNOSIS — K592 Neurogenic bowel, not elsewhere classified: Secondary | ICD-10-CM | POA: Diagnosis not present

## 2018-07-26 DIAGNOSIS — Z791 Long term (current) use of non-steroidal anti-inflammatories (NSAID): Secondary | ICD-10-CM | POA: Diagnosis not present

## 2018-07-26 HISTORY — PX: ERCP: SHX5425

## 2018-07-26 HISTORY — PX: STENT REMOVAL: SHX6421

## 2018-07-26 LAB — GLUCOSE, CAPILLARY: Glucose-Capillary: 156 mg/dL — ABNORMAL HIGH (ref 70–99)

## 2018-07-26 SURGERY — ERCP, WITH INTERVENTION IF INDICATED
Anesthesia: General

## 2018-07-26 MED ORDER — METOCLOPRAMIDE HCL 5 MG/ML IJ SOLN
10.0000 mg | Freq: Once | INTRAMUSCULAR | Status: AC
  Administered 2018-07-26: 10 mg via INTRAVENOUS

## 2018-07-26 MED ORDER — MIDAZOLAM HCL 5 MG/5ML IJ SOLN
INTRAMUSCULAR | Status: DC | PRN
  Administered 2018-07-26: 2 mg via INTRAVENOUS

## 2018-07-26 MED ORDER — SUGAMMADEX SODIUM 500 MG/5ML IV SOLN
INTRAVENOUS | Status: DC | PRN
  Administered 2018-07-26: 400 mg via INTRAVENOUS

## 2018-07-26 MED ORDER — INDOMETHACIN 50 MG RE SUPP: 100.0000 mg | Freq: Once | RECTAL | Status: DC

## 2018-07-26 MED ORDER — ONDANSETRON HCL 4 MG/2ML IJ SOLN
INTRAMUSCULAR | Status: DC | PRN
  Administered 2018-07-26: 4 mg via INTRAVENOUS

## 2018-07-26 MED ORDER — PROMETHAZINE HCL 25 MG/ML IJ SOLN: 6.2500 mg | INTRAMUSCULAR | Status: DC | PRN

## 2018-07-26 MED ORDER — FENTANYL CITRATE (PF) 100 MCG/2ML IJ SOLN
INTRAMUSCULAR | Status: DC | PRN
  Administered 2018-07-26: 100 ug via INTRAVENOUS

## 2018-07-26 MED ORDER — LIDOCAINE 2% (20 MG/ML) 5 ML SYRINGE
INTRAMUSCULAR | Status: DC | PRN
  Administered 2018-07-26: 100 mg via INTRAVENOUS

## 2018-07-26 MED ORDER — CIPROFLOXACIN IN D5W 400 MG/200ML IV SOLN
INTRAVENOUS | Status: AC
  Filled 2018-07-26: qty 200

## 2018-07-26 MED ORDER — INDOMETHACIN 50 MG RE SUPP
RECTAL | Status: DC | PRN
  Administered 2018-07-26: 100 mg via RECTAL

## 2018-07-26 MED ORDER — ESMOLOL HCL 100 MG/10ML IV SOLN
INTRAVENOUS | Status: DC | PRN
  Administered 2018-07-26: 40 mg via INTRAVENOUS

## 2018-07-26 MED ORDER — SODIUM CHLORIDE 0.9 % IV SOLN: INTRAVENOUS | Status: DC

## 2018-07-26 MED ORDER — ROCURONIUM BROMIDE 100 MG/10ML IV SOLN
INTRAVENOUS | Status: DC | PRN
  Administered 2018-07-26: 60 mg via INTRAVENOUS

## 2018-07-26 MED ORDER — PROPOFOL 10 MG/ML IV BOLUS
INTRAVENOUS | Status: DC | PRN
  Administered 2018-07-26: 200 mg via INTRAVENOUS

## 2018-07-26 MED ORDER — CIPROFLOXACIN IN D5W 400 MG/200ML IV SOLN
400.0000 mg | Freq: Once | INTRAVENOUS | Status: AC
  Administered 2018-07-26: 400 mg via INTRAVENOUS

## 2018-07-26 MED ORDER — METOCLOPRAMIDE HCL 5 MG/ML IJ SOLN
INTRAMUSCULAR | Status: AC
  Filled 2018-07-26: qty 2

## 2018-07-26 MED ORDER — GLUCAGON HCL RDNA (DIAGNOSTIC) 1 MG IJ SOLR
INTRAMUSCULAR | Status: AC
  Filled 2018-07-26: qty 1

## 2018-07-26 MED ORDER — MENTHOL 3 MG MT LOZG
1.0000 | LOZENGE | OROMUCOSAL | Status: DC | PRN
  Filled 2018-07-26: qty 9

## 2018-07-26 MED ORDER — LACTATED RINGERS IV SOLN
INTRAVENOUS | Status: DC
  Administered 2018-07-26: 1000 mL via INTRAVENOUS

## 2018-07-26 MED ORDER — FENTANYL CITRATE (PF) 100 MCG/2ML IJ SOLN
INTRAMUSCULAR | Status: AC
  Filled 2018-07-26: qty 2

## 2018-07-26 MED ORDER — INDOMETHACIN 50 MG RE SUPP
RECTAL | Status: AC
  Filled 2018-07-26: qty 2

## 2018-07-26 MED ORDER — ONDANSETRON HCL 4 MG/2ML IJ SOLN
INTRAMUSCULAR | Status: AC
  Filled 2018-07-26: qty 2

## 2018-07-26 MED ORDER — ONDANSETRON HCL 4 MG/2ML IJ SOLN
4.0000 mg | Freq: Once | INTRAMUSCULAR | Status: AC
  Administered 2018-07-26: 4 mg via INTRAVENOUS

## 2018-07-26 MED ORDER — MIDAZOLAM HCL 2 MG/2ML IJ SOLN
INTRAMUSCULAR | Status: AC
  Filled 2018-07-26: qty 2

## 2018-07-26 MED ORDER — DEXAMETHASONE SODIUM PHOSPHATE 10 MG/ML IJ SOLN
INTRAMUSCULAR | Status: DC | PRN
  Administered 2018-07-26: 10 mg via INTRAVENOUS

## 2018-07-26 NOTE — Discharge Instructions (Signed)
YOU HAD AN ENDOSCOPIC PROCEDURE TODAY: Refer to the procedure report and other information in the discharge instructions given to you for any specific questions about what was found during the examination. If this information does not answer your questions, please call [facility] office at [phone] to clarify.  ° °YOU SHOULD EXPECT: Some feelings of bloating in the abdomen. Passage of more gas than usual. Walking can help get rid of the air that was put into your GI tract during the procedure and reduce the bloating. If you had a lower endoscopy (such as a colonoscopy or flexible sigmoidoscopy) you may notice spotting of blood in your stool or on the toilet paper. Some abdominal soreness may be present for a day or two, also. ° °DIET: Your first meal following the procedure should be a light meal and then it is ok to progress to your normal diet. A half-sandwich or bowl of soup is an example of a good first meal. Heavy or fried foods are harder to digest and may make you feel nauseous or bloated. Drink plenty of fluids but you should avoid alcoholic beverages for 24 hours. If you had a esophageal dilation, please see attached instructions for diet.   ° °ACTIVITY: Your care partner should take you home directly after the procedure. You should plan to take it easy, moving slowly for the rest of the day. You can resume normal activity the day after the procedure however YOU SHOULD NOT DRIVE, use power tools, machinery or perform tasks that involve climbing or major physical exertion for 24 hours (because of the sedation medicines used during the test).  ° °SYMPTOMS TO REPORT IMMEDIATELY: °A gastroenterologist can be reached at any hour. Please call [phone]  for any of the following symptoms:  °Following lower endoscopy (colonoscopy, flexible sigmoidoscopy) °Excessive amounts of blood in the stool  °Significant tenderness, worsening of abdominal pains  °Swelling of the abdomen that is new, acute  °Fever of 100° or  higher  °Following upper endoscopy (EGD, EUS, ERCP, esophageal dilation) °Vomiting of blood or coffee ground material  °New, significant abdominal pain  °New, significant chest pain or pain under the shoulder blades  °Painful or persistently difficult swallowing  °New shortness of breath  °Black, tarry-looking or red, bloody stools ° °FOLLOW UP:  °If any biopsies were taken you will be contacted by phone or by letter within the next 1-3 weeks. Call [phone]  if you have not heard about the biopsies in 3 weeks.  °Please also call with any specific questions about appointments or follow up tests. ° °

## 2018-07-26 NOTE — Anesthesia Procedure Notes (Signed)
Procedure Name: Intubation Date/Time: 07/26/2018 12:36 PM Performed by: [place] Jahmeek Shirk C, CRNA Pre-anesthesia Checklist: Patient identified, Emergency Drugs available, Suction available and Patient being monitored Patient Re-evaluated:Patient Re-evaluated prior to induction Oxygen Delivery Method: Circle system utilized Preoxygenation: Pre-oxygenation with 100% oxygen Induction Type: IV induction Ventilation: Mask ventilation without difficulty Laryngoscope Size: Mac and 3 Grade View: Grade I Tube type: Oral Tube size: 7.5 mm Number of attempts: 1 Airway Equipment and Method: Stylet and Oral airway Placement Confirmation: ETT inserted through vocal cords under direct vision,  positive ETCO2 and breath sounds checked- equal and bilateral Tube secured with: Tape Dental Injury: Teeth and Oropharynx as per pre-operative assessment

## 2018-07-26 NOTE — Anesthesia Preprocedure Evaluation (Signed)
Anesthesia Evaluation  Patient identified by MRN, date of birth, ID band Patient awake    Reviewed: Allergy & Precautions, NPO status , Patient's Chart, lab work & pertinent test results  Airway Mallampati: II  TM Distance: >3 FB Neck ROM: Full    Dental no notable dental hx.    Pulmonary neg pulmonary ROS, former smoker,    Pulmonary exam normal breath sounds clear to auscultation       Cardiovascular hypertension, Normal cardiovascular exam Rhythm:Regular Rate:Normal     Neuro/Psych negative psych ROS   GI/Hepatic negative GI ROS, Neg liver ROS,   Endo/Other  diabetesMorbid obesity  Renal/GU negative Renal ROS  negative genitourinary   Musculoskeletal negative musculoskeletal ROS (+)   Abdominal   Peds negative pediatric ROS (+)  Hematology Sickle cell dz   Anesthesia Other Findings   Reproductive/Obstetrics negative OB ROS                             Anesthesia Physical Anesthesia Plan  ASA: III  Anesthesia Plan: General   Post-op Pain Management:    Induction: Intravenous  PONV Risk Score and Plan: 3 and Ondansetron, Dexamethasone and Treatment may vary due to age or medical condition  Airway Management Planned: Oral ETT  Additional Equipment:   Intra-op Plan:   Post-operative Plan: Extubation in OR  Informed Consent: I have reviewed the patients History and Physical, chart, labs and discussed the procedure including the risks, benefits and alternatives for the proposed anesthesia with the patient or authorized representative who has indicated his/her understanding and acceptance.   Dental advisory given  Plan Discussed with: CRNA and Surgeon  Anesthesia Plan Comments:         Anesthesia Quick Evaluation

## 2018-07-26 NOTE — Transfer of Care (Signed)
Immediate Anesthesia Transfer of Care Note  Patient: Cynthia Rosales  Procedure(s) Performed: ENDOSCOPIC RETROGRADE CHOLANGIOPANCREATOGRAPHY (ERCP) (N/A ) STENT REMOVAL  Patient Location: PACU and Endoscopy Unit  Anesthesia Type:General  Level of Consciousness: awake, alert  and oriented  Airway & Oxygen Therapy: Patient Spontanous Breathing and Patient connected to face mask oxygen  Post-op Assessment: Report given to RN and Post -op Vital signs reviewed and stable  Post vital signs: Reviewed and stable  Last Vitals:  Vitals Value Taken Time  BP 123/84 07/26/2018  2:16 PM  Temp    Pulse 90 07/26/2018  2:17 PM  Resp 14 07/26/2018  2:17 PM  SpO2 100 % 07/26/2018  2:17 PM  Vitals shown include unvalidated device data.  Last Pain:  Vitals:   07/26/18 1123  TempSrc: Oral  PainSc: 0-No pain         Complications: No apparent anesthesia complications

## 2018-07-26 NOTE — Progress Notes (Signed)
Dr. Kalman Shan notified of patient's vomiting and nausea in recovery. Patient complaining of severe pain in her throat as well. Per his instructions, I gave zofran, reglan, and throat lozenges which did provide some relief to her symptoms. I continued to observe her in recovery for an additional 30 minutes at which point I thought it was okay to send her home with Dr. Blanch Media number if the pain does not improve in the next few days.

## 2018-07-26 NOTE — Anesthesia Postprocedure Evaluation (Signed)
Anesthesia Post Note  Patient: Cynthia Rosales  Procedure(s) Performed: ENDOSCOPIC RETROGRADE CHOLANGIOPANCREATOGRAPHY (ERCP) (N/A ) STENT REMOVAL     Patient location during evaluation: PACU Anesthesia Type: General Level of consciousness: awake and alert Pain management: pain level controlled Vital Signs Assessment: post-procedure vital signs reviewed and stable Respiratory status: spontaneous breathing, nonlabored ventilation, respiratory function stable and patient connected to nasal cannula oxygen Cardiovascular status: blood pressure returned to baseline and stable Postop Assessment: no apparent nausea or vomiting Anesthetic complications: no    Last Vitals:  Vitals:   07/26/18 1416 07/26/18 1420  BP: 123/84 (!) 145/85  Pulse: 92 90  Resp: 16 18  Temp: 36.6 C   SpO2: 100% 100%    Last Pain:  Vitals:   07/26/18 1420  TempSrc:   PainSc: 0-No pain                 Donae Kueker S

## 2018-07-26 NOTE — Op Note (Signed)
[facility] Patient Name: Cynthia Rosales Procedure Date: 07/26/2018 MRN: [id number] Attending MD: Docia Chuck. Henrene Pastor , MD Date of Birth: 02-Jun-1981 CSN: [id number] Age: 37 Admit Type: Outpatient Procedure:                ERCP, with biliary stent removal Indications:              Follow-up of bile leak. Status post ERCP with                            sphincterotomy, stone extraction, and biliary stent                            placement for bile leak and early June 2019 with                            Dr. Loletha Carrow Providers:                Docia Chuck. Henrene Pastor, MD, Vista Lawman, RN, Cletis Athens,                            Technician, Stephanie [place] CRNA Referring MD:             [facility] Medicines:                General Anesthesia Complications:            No immediate complications. Estimated Blood Loss:     Estimated blood loss: none. Procedure:                Pre-Anesthesia Assessment:                           - Prior to the procedure, a History and Physical                            was performed, and patient medications and                            allergies were reviewed. The patient's tolerance of                            previous anesthesia was also reviewed. The risks                            and benefits of the procedure and the sedation                            options and risks were discussed with the patient.                            All questions were answered, and informed consent                            was obtained. Prior Anticoagulants: The patient has  taken no previous anticoagulant or antiplatelet                            agents. ASA Grade Assessment: II - A patient with                            mild systemic disease. After reviewing the risks                            and benefits, the patient was deemed in                            satisfactory condition to undergo the procedure.                            After obtaining informed consent, the scope was                            passed under direct vision. Throughout the                            procedure, the patient's blood pressure, pulse, and                            oxygen saturations were monitored continuously. The                            [id number]-[id number] ([id number]) Olympus ERCP was introduced                            through the mouth, and used to inject contrast into                            and used to inject contrast into the bile duct. The                            ERCP was accomplished without difficulty. The                            patient tolerated the procedure well. Scope In: Scope Out: Findings:      The esophagus was successfully intubated under direct vision. The scope       was advanced to a normal major papilla in the descending duodenum       without detailed examination of the pharynx, larynx and associated       structures, and upper GI tract. The upper GI tract was grossly normal.       One stent was removed from the biliary tree using a snare. The bile duct       was deeply cannulated with the sphincterotome. Contrast was injected. I       personally interpreted the bile duct images. There was appropriate flow       of contrast through the ducts. Image quality was excellent. Contrast       extended to the entire biliary tree. Opacification of  the entire biliary       tree was successful. The entire opacified area was normal. The entire       biliary tree showed no extravasation of contrast or stones present. A       cholecystectomy had been performed. The pancreatic duct was not injected       during this procedure. Impression:               - The patient has had a cholecystectomy. Otherwise                            normal biliary tree without evidence of residual                            leak or filling.                           - One stent was removed from the biliary  tree. Moderate Sedation:      none Recommendation:           - Resume previous diet.                           - Continue present medications.                           - Return to the care of your primary provider. GI                            follow-up as needed Procedure Code(s):        --- Professional ---                           [id number], Endoscopic retrograde                            cholangiopancreatography (ERCP); with removal of                            foreign body(s) or stent(s) from biliary/pancreatic                            duct(s) Diagnosis Code(s):        --- Professional ---                           Z90.49, Acquired absence of other specified parts                            of digestive tract                           Z46.59, Encounter for fitting and adjustment of                            other gastrointestinal appliance and device  K83.8, Other specified diseases of biliary tract CPT copyright 2017 [employer]. All rights reserved. The codes documented in this report are preliminary and upon coder review may  be revised to meet current compliance requirements. Docia Chuck. Henrene Pastor, MD 07/26/2018 2:09:07 PM This report has been signed electronically. Number of Addenda: 0

## 2018-07-26 NOTE — Interval H&P Note (Signed)
History and Physical Interval Note:  07/26/2018 12:24 PM  Cynthia Rosales  has presented today for surgery, with the diagnosis of bile leak postop  The various methods of treatment have been discussed with the patient and family. After consideration of risks, benefits and other options for treatment, the patient has consented to  Procedure(s) with comments: ENDOSCOPIC RETROGRADE CHOLANGIOPANCREATOGRAPHY (ERCP) (N/A) - stent removal as a surgical intervention .  The patient's history has been reviewed, patient examined, no change in status, stable for surgery.  I have reviewed the patient's chart and labs.  Questions were answered to the patient's satisfaction.    No interval issues since recent office visit. She does mention some occasional mild pain. No fevers. No jaundice or dark urine. Her exam is benign save obesity. Now for ERCP with stent removal and interrogation of the bile duct to rule out residual filling defects or persistent bile leak.The nature of the procedure, as well as the risks, benefits, and alternatives were carefully and thoroughly reviewed with the patient. Ample time for discussion and questions allowed. The patient understood, was satisfied, and agreed to proceed.   Scarlette Shorts

## 2018-07-27 ENCOUNTER — Encounter (HOSPITAL_COMMUNITY): Payer: Self-pay | Admitting: Internal Medicine

## 2018-08-07 ENCOUNTER — Inpatient Hospital Stay
Admission: RE | Admit: 2018-08-07 | Discharge: 2018-08-07 | Disposition: A | Discharge status: Home / Self Care | Payer: Self-pay | Source: Ambulatory Visit | Attending: Neurosurgery | Admitting: Neurosurgery

## 2018-08-10 ENCOUNTER — Ambulatory Visit: Payer: Medicaid Other | Admitting: Adult Health

## 2018-08-10 ENCOUNTER — Encounter: Payer: Self-pay | Admitting: Adult Health

## 2018-08-16 ENCOUNTER — Ambulatory Visit
Admission: RE | Admit: 2018-08-16 | Discharge: 2018-08-16 | Disposition: A | Discharge status: Home / Self Care | Payer: Medicare Other | Source: Ambulatory Visit | Attending: Neurosurgery | Admitting: Neurosurgery

## 2018-08-16 DIAGNOSIS — C719 Malignant neoplasm of brain, unspecified: Secondary | ICD-10-CM

## 2018-08-22 ENCOUNTER — Other Ambulatory Visit: Payer: Self-pay

## 2018-08-22 ENCOUNTER — Encounter (HOSPITAL_COMMUNITY): Payer: Self-pay

## 2018-08-22 ENCOUNTER — Emergency Department (HOSPITAL_COMMUNITY)
Admission: EM | Admit: 2018-08-22 | Discharge: 2018-08-23 | Disposition: A | Discharge status: Home / Self Care | Payer: Medicare Other | Attending: Emergency Medicine | Admitting: Emergency Medicine

## 2018-08-22 ENCOUNTER — Emergency Department (HOSPITAL_COMMUNITY): Payer: Medicare Other

## 2018-08-22 DIAGNOSIS — J45909 Unspecified asthma, uncomplicated: Secondary | ICD-10-CM | POA: Diagnosis not present

## 2018-08-22 DIAGNOSIS — E119 Type 2 diabetes mellitus without complications: Secondary | ICD-10-CM | POA: Diagnosis not present

## 2018-08-22 DIAGNOSIS — I10 Essential (primary) hypertension: Secondary | ICD-10-CM | POA: Insufficient documentation

## 2018-08-22 DIAGNOSIS — M791 Myalgia, unspecified site: Secondary | ICD-10-CM | POA: Insufficient documentation

## 2018-08-22 DIAGNOSIS — Z794 Long term (current) use of insulin: Secondary | ICD-10-CM | POA: Insufficient documentation

## 2018-08-22 DIAGNOSIS — Z87891 Personal history of nicotine dependence: Secondary | ICD-10-CM | POA: Insufficient documentation

## 2018-08-22 DIAGNOSIS — Z7984 Long term (current) use of oral hypoglycemic drugs: Secondary | ICD-10-CM | POA: Diagnosis not present

## 2018-08-22 DIAGNOSIS — Z79899 Other long term (current) drug therapy: Secondary | ICD-10-CM | POA: Diagnosis not present

## 2018-08-22 DIAGNOSIS — D5700 Hb-SS disease with crisis, unspecified: Secondary | ICD-10-CM | POA: Diagnosis not present

## 2018-08-22 LAB — COMPREHENSIVE METABOLIC PANEL
ALT: 13 U/L (ref 0–44)
AST: 16 U/L (ref 15–41)
Albumin: 3.1 g/dL — ABNORMAL LOW (ref 3.5–5.0)
Alkaline Phosphatase: 84 U/L (ref 38–126)
Anion gap: 12 (ref 5–15)
BUN: 12 mg/dL (ref 6–20)
CO2: 25 mmol/L (ref 22–32)
Calcium: 8.8 mg/dL — ABNORMAL LOW (ref 8.9–10.3)
Chloride: 98 mmol/L (ref 98–111)
Creatinine, Ser: 0.9 mg/dL (ref 0.44–1.00)
GFR calc Af Amer: >60 mL/min (ref >60)
GFR calc non Af Amer: >60 mL/min (ref >60)
Glucose, Bld: 143 mg/dL — ABNORMAL HIGH (ref 70–99)
Potassium: 3.9 mmol/L (ref 3.5–5.1)
Sodium: 135 mmol/L (ref 135–145)
Total Bilirubin: 0.2 mg/dL — ABNORMAL LOW (ref 0.3–1.2)
Total Protein: 7.1 g/dL (ref 6.5–8.1)

## 2018-08-22 LAB — URINALYSIS, ROUTINE W REFLEX MICROSCOPIC
Bilirubin Urine: NEGATIVE
Glucose, UA: NEGATIVE mg/dL
Ketones, ur: NEGATIVE mg/dL
Leukocytes, UA: NEGATIVE
Nitrite: NEGATIVE
Protein, ur: NEGATIVE mg/dL
Specific Gravity, Urine: 1.015 (ref 1.005–1.030)
pH: 5 (ref 5.0–8.0)

## 2018-08-22 LAB — CBC WITH DIFFERENTIAL/PLATELET
Basophils Absolute: 0.1 10*3/uL (ref 0.0–0.1)
Basophils Relative: 1 %
Eosinophils Absolute: 0.1 10*3/uL (ref 0.0–0.7)
Eosinophils Relative: 1 %
HCT: 30.8 % — ABNORMAL LOW (ref 36.0–46.0)
Hemoglobin: 9.1 g/dL — ABNORMAL LOW (ref 12.0–15.0)
Lymphocytes Relative: 34 %
Lymphs Abs: 2.6 10*3/uL (ref 0.7–4.0)
MCH: 19.5 pg — ABNORMAL LOW (ref 26.0–34.0)
MCHC: 29.5 g/dL — ABNORMAL LOW (ref 30.0–36.0)
MCV: 66.1 fL — ABNORMAL LOW (ref 78.0–100.0)
Monocytes Absolute: 0.4 10*3/uL (ref 0.1–1.0)
Monocytes Relative: 5 %
Neutro Abs: 4.5 10*3/uL (ref 1.7–7.7)
Neutrophils Relative %: 59 %
Platelets: 468 10*3/uL — ABNORMAL HIGH (ref 150–400)
RBC: 4.66 MIL/uL (ref 3.87–5.11)
RDW: 20.3 % — ABNORMAL HIGH (ref 11.5–15.5)
WBC: 7.7 10*3/uL (ref 4.0–10.5)

## 2018-08-22 LAB — RETICULOCYTES
RBC.: 4.66 MIL/uL (ref 3.87–5.11)
Retic Count, Absolute: 46.6 10*3/uL (ref 19.0–186.0)
Retic Ct Pct: 1 % (ref 0.4–3.1)

## 2018-08-22 LAB — I-STAT BETA HCG BLOOD, ED (MC, WL, AP ONLY): I-stat hCG, quantitative: <5 m[IU]/mL (ref <5)

## 2018-08-22 MED ORDER — HYDROMORPHONE HCL 1 MG/ML IJ SOLN: 2.0000 mg | INTRAMUSCULAR | Status: AC

## 2018-08-22 MED ORDER — TRAMADOL HCL 50 MG PO TABS
50.0000 mg | ORAL_TABLET | Freq: Once | ORAL | Status: AC
  Administered 2018-08-22: 50 mg via ORAL
  Filled 2018-08-22: qty 1

## 2018-08-22 MED ORDER — SODIUM CHLORIDE 0.45 % IV SOLN
INTRAVENOUS | Status: DC
  Administered 2018-08-22: 17:00:00 via INTRAVENOUS

## 2018-08-22 MED ORDER — DIPHENHYDRAMINE HCL 50 MG/ML IJ SOLN
25.0000 mg | Freq: Once | INTRAMUSCULAR | Status: AC
  Administered 2018-08-22: 25 mg via INTRAVENOUS
  Filled 2018-08-22: qty 1

## 2018-08-22 MED ORDER — HYDROMORPHONE HCL 1 MG/ML IJ SOLN
2.0000 mg | INTRAMUSCULAR | Status: AC
  Filled 2018-08-22: qty 2

## 2018-08-22 MED ORDER — HYDROMORPHONE HCL 1 MG/ML IJ SOLN
2.0000 mg | INTRAMUSCULAR | Status: AC
  Administered 2018-08-22: 2 mg via INTRAVENOUS

## 2018-08-22 MED ORDER — ONDANSETRON HCL 4 MG/2ML IJ SOLN
4.0000 mg | INTRAMUSCULAR | Status: DC | PRN
  Filled 2018-08-22: qty 2

## 2018-08-22 MED ORDER — OXYCODONE-ACETAMINOPHEN 5-325 MG PO TABS
1.0000 | ORAL_TABLET | ORAL | Status: DC | PRN
  Administered 2018-08-22: 1 via ORAL
  Filled 2018-08-22: qty 1

## 2018-08-22 MED ORDER — ETODOLAC 300 MG PO CAPS: 300.0000 mg | ORAL_CAPSULE | Freq: Three times a day (TID) | ORAL | 0 refills | Status: DC | PRN

## 2018-08-22 MED ORDER — KETOROLAC TROMETHAMINE 30 MG/ML IJ SOLN
30.0000 mg | INTRAMUSCULAR | Status: AC
  Administered 2018-08-22: 30 mg via INTRAVENOUS
  Filled 2018-08-22: qty 1

## 2018-08-22 MED ORDER — HYDROMORPHONE HCL 1 MG/ML IJ SOLN: 0.5000 mg | Freq: Once | INTRAMUSCULAR | Status: DC

## 2018-08-22 NOTE — ED Provider Notes (Signed)
[facility] EMERGENCY DEPARTMENT Provider Note   CSN: [id number] Arrival date & time: 08/22/18  1245     History   Chief Complaint Chief Complaint  Patient presents with  . Sickle Cell Pain Crisis    HPI Cynthia Rosales is a 37 y.o. female.  HPI Patient presents to the emergency room for evaluation of diffuse muscle pain.  Patient states she feels like it is her sickle cell.  She has a history of sickle cell disease but does not frequently have pain crises.  Her last attack was about 8 months ago.  Patient has multiple other medical problems including paralysis associated with a spinal tumor.  Patient states her body is aching all over including her arms legs fingers.  She felt feverish earlier and took a temperature and it was 101.  She denies any coughing or dysuria.  No vomiting or diarrhea.  She has not noticed any rashes or sores.  Past Medical History:  Diagnosis Date  . Abscess of bursa, left elbow 06/2013   Treated with I and D/antibiotics.   . Anemia   . Asthma   . Depression   . History of blood transfusion    "after I had one of my kids"  . Hypertension   . Ovarian cyst   . Renal disorder    kidney stones  . Type II diabetes mellitus [facility])     Patient Active Problem List   Diagnosis Date Noted  . Bile leak, postoperative   . Choledocholithiasis   . Intra-abdominal fluid collection   . Anemia 04/28/2018  . Morbid obesity with body mass index (BMI) of 50.0 to 59.9 in adult ([place]) 04/02/2018  . Mass of uterus 04/02/2018  . Transaminitis 04/02/2018  . Acute cholecystitis s/p lap cholecystectomy 04/03/2018 04/01/2018  . Pregnancy, ectopic, cornual or cervical 07/21/2017  . 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  . Neurogenic bladder 08/23/2015  . Bilateral carpal tunnel syndrome 08/23/2015  . Paraparesis of both lower limbs ([place]) 07/14/2015  . Ileus, postoperative ([place]) 12/25/2014  . Sickle cell disease without crisis ([place]) 12/25/2014  . Diabetes  mellitus type 2 in obese ([place]) 12/25/2014  . 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[place]) 12/24/2014  . 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 Ileus of unspecified type ([place])   . Atelectasis   . Bowel obstruction ([place])   . Encounter for central line care   . Ileus ([place])   . Intestinal occlusion ([facility])   . 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  . Headache 11/19/2014  . Neck pain 11/19/2014  . Right sided weakness 11/19/2014  . Paresthesias 11/19/2014  . Nightmares 09/11/2014  . HTN (hypertension), benign 03/28/2014  . Nephrolithiasis 03/27/2014  . Diabetes mellitus ([place]) 03/27/2014  . Abdominal pain 03/27/2014    Past Surgical History:  Procedure Laterality Date  . APPENDECTOMY  2013  . BILIARY STENT PLACEMENT N/A 05/02/2018   Procedure: BILIARY STENT PLACEMENT;  Surgeon: Doran Stabler, MD;  [facility]: [facility];  Service: Gastroenterology;  Laterality: N/A;  . CARPAL TUNNEL RELEASE    . CESAREAN SECTION  2000; 2007; 2011  . CHOLECYSTECTOMY N/A 04/03/2018   Procedure: LAPAROSCOPIC CHOLECYSTECTOMY;  Surgeon: Rolm Bookbinder, MD;  Location: [facility];  Service: General;  Laterality: N/A;  . DILATION AND CURETTAGE OF UTERUS    . ERCP N/A 05/02/2018   Procedure: ENDOSCOPIC RETROGRADE CHOLANGIOPANCREATOGRAPHY (ERCP);  Surgeon: Doran Stabler, MD;  Location: [facility];  Service: Gastroenterology;  Laterality: N/A;  .  ERCP N/A 07/26/2018   Procedure: ENDOSCOPIC RETROGRADE CHOLANGIOPANCREATOGRAPHY (ERCP);  Surgeon: Irene Shipper, MD;  Location: [facility];  Service: Endoscopy;  Laterality: N/A;  stent removal  . IR RADIOLOGIST EVAL & MGMT  05/19/2018  . LAMINECTOMY N/A 12/13/2014   Procedure: CERVICAL LAMINECTOMY FOR INTRADURAL TUMOR;  Surgeon: Charlie Pitter, MD;  Location: [facility];  Service: Neurosurgery;  Laterality: N/A;  posterior  . LAPAROSCOPY N/A 07/20/2017   Procedure: LAPAROSCOPY OPERATIVE WITH WEDGE RESECTION RIGHT CORNUA AND PARTIAL SALPINGECTOMY;   Surgeon: [name], MD;  Location: [facility];  Service: Gynecology;  Laterality: N/A;  . REDUCTION MAMMAPLASTY Bilateral 1998  . REMOVAL OF STONES  05/02/2018   Procedure: REMOVAL OF STONES;  Surgeon: Doran Stabler, MD;  [facility]: [facility];  Service: Gastroenterology;;  . [name]  05/02/2018   Procedure: SPHINCTEROTOMY;  Surgeon: Doran Stabler, MD;  Location: [facility];  Service: Gastroenterology;;  . Lavell Islam REMOVAL  07/26/2018   Procedure: STENT REMOVAL;  Surgeon: Irene Shipper, MD;  Location: [facility];  Service: Endoscopy;;     OB History    Gravida  5   Para  3   Term  2   Preterm  1   AB      Living  3     SAB      TAB      Ectopic      Multiple      Live Births  3            Home Medications    Prior to Admission medications   Medication Sig Start Date End Date Taking? Authorizing Provider  albuterol (PROVENTIL HFA;VENTOLIN HFA) 108 (90 Base) MCG/ACT inhaler Inhale 2 puffs into the lungs every 6 (six) hours as needed for wheezing or shortness of breath.   Yes [provider]  AMITIZA 24 MCG capsule Take 24 mcg by mouth daily as needed for constipation.  04/27/18  Yes [provider]  cholestyramine (QUESTRAN) 4 g packet Take 1 packet (4 g total) by mouth 3 (three) times daily with meals. 07/07/18  Yes Esterwood, Amy S, PA-C  diazepam (VALIUM) 5 MG tablet take 1 tablet BY MOUTH THREE TIMES DAILY AS NEEDED Patient taking differently: Take 5 mg by mouth every 8 (eight) hours as needed for anxiety (pain). take 1 tablet BY MOUTH THREE TIMES DAILY AS NEEDED 04/27/18  Yes Melvenia Beam, MD  hydrochlorothiazide (HYDRODIURIL) 25 MG tablet Take 25 mg by mouth daily.  04/27/18  Yes [provider]  ibuprofen (ADVIL,MOTRIN) 800 MG tablet Take 800 mg by mouth every 8 (eight) hours as needed for mild pain or moderate pain.   Yes [provider]  insulin NPH-regular Human (NOVOLIN 70/30) (70-30) 100 UNIT/ML injection  Inject 34-36 Units into the skin See admin instructions. Inject 34 units SQ in the morning, inject 36 units SQ in the afternoon and 36 units after supper   Yes [provider]  oxycodone (ROXICODONE) 30 MG immediate release tablet Take 1 tablet (30 mg total) by mouth every 6 (six) hours as needed for pain. 05/04/18  Yes Jegede, Olugbemiga E, MD  sitaGLIPtin (JANUVIA) 100 MG tablet Take 100 mg by mouth daily.   Yes [provider]    Family History Family History  Problem Relation Age of Onset  . Diabetes Mother   . Hypertension Mother   . Breast cancer Maternal Aunt   . Ovarian cancer Maternal Grandmother   .  Breast cancer Maternal Aunt   . Migraines Neg Hx     Social History Social History   Tobacco Use  . Smoking status: Former Smoker    Years: 0.00  . Smokeless tobacco: Never Used  . Tobacco comment: 03/27/2014 "smoked ~ 1 cigarette/day; quit in ~ 2013"  Substance Use Topics  . Alcohol use: Never    Alcohol/week: 0.0 standard drinks    Frequency: Never  . Drug use: No     Allergies   Amoxicillin; Buprenorphine hcl; and Morphine and related   Review of Systems Review of Systems  All other systems reviewed and are negative.    Physical Exam Updated Vital Signs BP 131/90   Pulse 88   Temp 98.6 F (37 C) (Oral)   Resp 16   SpO2 96%   Physical Exam  Constitutional: No distress.  Overweight  HENT:  Head: Normocephalic and atraumatic.  Right Ear: External ear normal.  Left Ear: External ear normal.  Eyes: Conjunctivae are normal. Right eye exhibits no discharge. Left eye exhibits no discharge. No scleral icterus.  Neck: Neck supple. No tracheal deviation present.  Cardiovascular: Normal rate, regular rhythm and intact distal pulses.  Pulmonary/Chest: Effort normal and breath sounds normal. No stridor. No respiratory distress. She has no wheezes. She has no rales.  Abdominal: Soft. Bowel sounds are normal. She exhibits no distension. There is no  tenderness. There is no rebound and no guarding.  Musculoskeletal: She exhibits no edema or tenderness.  Neurological: She is alert. No cranial nerve deficit (no facial droop, extraocular movements intact, no slurred speech) or sensory deficit. She exhibits normal muscle tone. She displays no seizure activity. Coordination normal.  Patient is unable to move her lower extremities, weakness in the right upper extremity compared to the left (this is chronic per the patient)  Skin: Skin is warm and dry. No rash noted. She is not diaphoretic.  Psychiatric: She has a normal mood and affect.  Nursing note and vitals reviewed.    ED Treatments / Results  Labs (all labs ordered are listed, but only abnormal results are displayed) Labs Reviewed  COMPREHENSIVE METABOLIC PANEL - Abnormal; Notable for the following components:      Result Value   Glucose, Bld 143 (*)    Calcium 8.8 (*)    Albumin 3.1 (*)    Total Bilirubin 0.2 (*)    All other components within normal limits  CBC WITH DIFFERENTIAL/PLATELET - Abnormal; Notable for the following components:   Hemoglobin 9.1 (*)    HCT 30.8 (*)    MCV 66.1 (*)    MCH 19.5 (*)    MCHC 29.5 (*)    RDW 20.3 (*)    Platelets 468 (*)    All other components within normal limits  URINALYSIS, ROUTINE W REFLEX MICROSCOPIC - Abnormal; Notable for the following components:   APPearance HAZY (*)    Hgb urine dipstick MODERATE (*)    Bacteria, UA RARE (*)    All other components within normal limits  RETICULOCYTES  SICKLE CELL SCREEN  I-STAT BETA HCG BLOOD, ED ([facility], [facility], [facility] ONLY)    EKG None  Radiology Dg Chest 2 View  Result Date: 08/22/2018 CLINICAL DATA:  Back pain and leg pain bilaterally for 4 days EXAM: CHEST - 2 VIEW COMPARISON:  05/11/2016 FINDINGS: Normal heart size, mediastinal contours, and pulmonary vascularity. Lungs clear. No pleural effusion or pneumothorax. Bones unremarkable. IMPRESSION: Normal exam. Electronically Signed   By: Lavonia Dana  M.D.   On: 08/22/2018 17:05    Procedures Procedures (including critical care time)  Medications Ordered in ED Medications  oxyCODONE-acetaminophen (PERCOCET/ROXICET) 5-325 MG per tablet 1 tablet (1 tablet Oral Given 08/22/18 1257)  0.45 % sodium chloride infusion ( Intravenous New Bag/Given 08/22/18 1710)  ondansetron (ZOFRAN) injection 4 mg (has no administration in time range)  HYDROmorphone (DILAUDID) injection 2 mg (has no administration in time range)    Or  HYDROmorphone (DILAUDID) injection 2 mg (has no administration in time range)  HYDROmorphone (DILAUDID) injection 2 mg (has no administration in time range)    Or  HYDROmorphone (DILAUDID) injection 2 mg (has no administration in time range)  ketorolac (TORADOL) 30 MG/ML injection 30 mg (30 mg Intravenous Given 08/22/18 1710)  diphenhydrAMINE (BENADRYL) injection 25 mg (25 mg Intravenous Given 08/22/18 1712)  HYDROmorphone (DILAUDID) injection 2 mg (2 mg Intravenous Given 08/22/18 1718)    Or  HYDROmorphone (DILAUDID) injection 2 mg ( Subcutaneous See Alternative 08/22/18 1718)  HYDROmorphone (DILAUDID) injection 2 mg (2 mg Intravenous Given 08/22/18 1941)    Or  HYDROmorphone (DILAUDID) injection 2 mg ( Subcutaneous See Alternative 08/22/18 1941)     Initial Impression / Assessment and Plan / ED Course  I have reviewed the triage vital signs and the nursing notes.  Pertinent labs & imaging results that were available during my care of the patient were reviewed by me and considered in my medical decision making (see chart for details).  Clinical Course as of Aug 22 2232  Mon Aug 22, 2018  1615 Old labs reviewed.  In 2014, pt has 0% Hgb S.  She states she has history of sickle cell disease.  She does not see a sickle cell doctor   [JK]  1700 Pt was able to walk into the bed while in the ED   [JK]    Clinical Course User Index [JK] Dorie Rank, MD    She presented to the emergency with complaints of diffuse pain.   Patient's laboratory tests are reassuring.  She does not have an elevated reticulocyte count.  records indicate that she may not actually have sickle cell disease.  She does not have any signs of infection.  I discussed this with the patient.  She states she has been told twice in the past she has sickle cell disease.  I recommended she follow up with a PCP or sickle cell doctor. Pt was treated with pain meds.  She had some improvement although still having pain.  At this time there does not appear to be any evidence of an acute emergency medical condition and the patient appears stable for discharge with appropriate outpatient follow up.   Final Clinical Impressions(s) / ED Diagnoses   Final diagnoses:  Myalgia    ED Discharge Orders    None       Dorie Rank, MD 08/22/18 2236

## 2018-08-22 NOTE — ED Triage Notes (Signed)
Pt from home with ems for a sickle cell pain crisis, states her last crisis was about 8 months ago. Pt endorsing generalized pain all over. 10/10. Pt a.o upon arrival.   BP 137/77 HR 95 98% room air CBG 143

## 2018-08-22 NOTE — Discharge Instructions (Signed)
Take the medications as prescribed, follow-up with a primary care doctor to follow-up in the sickle cell tests

## 2018-08-22 NOTE — ED Notes (Signed)
[facility] called to transport pt home 

## 2018-08-22 NOTE — ED Notes (Signed)
Pt ambulated from the wheelchair to the bed with minimal assistance and no difficulty noted. RN notified

## 2018-08-22 NOTE — ED Notes (Signed)
Pt st's no relief from pain med. 

## 2018-08-22 NOTE — ED Notes (Signed)
Pt up to bedside commode to obtain urine specimen.  Pt tolerated well

## 2018-08-22 NOTE — ED Notes (Signed)
Pt c/o pain all over x's 4 days.  St's muscles feel tight

## 2018-08-23 LAB — SICKLE CELL SCREEN: Sickle Cell Screen: NEGATIVE

## 2018-08-27 ENCOUNTER — Ambulatory Visit
Admission: RE | Admit: 2018-08-27 | Discharge: 2018-08-27 | Disposition: A | Discharge status: Home / Self Care | Payer: Medicare Other | Source: Ambulatory Visit | Attending: Neurosurgery | Admitting: Neurosurgery

## 2018-08-27 MED ORDER — GADOBENATE DIMEGLUMINE 529 MG/ML IV SOLN
20.0000 mL | Freq: Once | INTRAVENOUS | Status: AC | PRN
  Administered 2018-08-27: 20 mL via INTRAVENOUS

## 2018-09-02 ENCOUNTER — Other Ambulatory Visit: Payer: Self-pay | Admitting: Neurology

## 2018-09-02 DIAGNOSIS — R252 Cramp and spasm: Secondary | ICD-10-CM

## 2018-10-05 ENCOUNTER — Other Ambulatory Visit: Payer: Self-pay | Admitting: Neurology

## 2018-10-05 DIAGNOSIS — R252 Cramp and spasm: Secondary | ICD-10-CM

## 2018-10-05 MED ORDER — DIAZEPAM 5 MG PO TABS: 5.0000 mg | ORAL_TABLET | Freq: Three times a day (TID) | ORAL | 4 refills | Status: DC | PRN

## 2018-10-05 NOTE — Telephone Encounter (Signed)
[place] drug registry checked by [place].

## 2018-10-05 NOTE — Telephone Encounter (Signed)
Dr. Jaynee Eagles aware of pt's request for Valium. Last refill 09/02/18 per [place] drug registry.

## 2018-10-05 NOTE — Telephone Encounter (Signed)
Pt has scheduled a f/u appointment, she has accepted 1st available 12-19-2018 and is on wait list.  Pt is asking for a refill on her diazepam (VALIUM) 5 MG tablet [place], [place] [phone] (Phone) [phone] (Fax)   Please call if this can not be done

## 2018-10-10 NOTE — Telephone Encounter (Signed)
Pt requesting a call stating she is needing her medication sent to [facility]

## 2018-10-10 NOTE — Telephone Encounter (Signed)
Called [facility] and spoke with [facility]. Canceled Valium 5 mg prescription there since pt has requested to fill it at another pharmacy. [place] stated that pt not been able to fill at [facility] location.

## 2018-10-10 NOTE — Addendum Note (Signed)
Addended by: Gildardo Griffes on: 10/10/2018 02:55 PM   Modules accepted: Orders

## 2018-10-11 MED ORDER — DIAZEPAM 5 MG PO TABS: 5.0000 mg | ORAL_TABLET | Freq: Three times a day (TID) | ORAL | 4 refills | Status: DC | PRN

## 2018-12-19 ENCOUNTER — Encounter: Payer: Self-pay | Admitting: Neurology

## 2018-12-19 ENCOUNTER — Encounter

## 2018-12-19 ENCOUNTER — Ambulatory Visit (INDEPENDENT_AMBULATORY_CARE_PROVIDER_SITE_OTHER): Payer: Medicare Other | Admitting: Neurology

## 2018-12-19 VITALS — BP 129/77 | HR 102 | Ht 64.0 in | Wt 342.0 lb

## 2018-12-19 DIAGNOSIS — R531 Weakness: Secondary | ICD-10-CM

## 2018-12-19 DIAGNOSIS — R252 Cramp and spasm: Secondary | ICD-10-CM | POA: Diagnosis not present

## 2018-12-19 DIAGNOSIS — G8250 Quadriplegia, unspecified: Secondary | ICD-10-CM

## 2018-12-19 DIAGNOSIS — R269 Unspecified abnormalities of gait and mobility: Secondary | ICD-10-CM

## 2018-12-19 DIAGNOSIS — S14104S Unspecified injury at C4 level of cervical spinal cord, sequela: Secondary | ICD-10-CM | POA: Diagnosis not present

## 2018-12-19 MED ORDER — DIAZEPAM 5 MG PO TABS: ORAL_TABLET | ORAL | 3 refills | Status: DC
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
## 2019-01-04 ENCOUNTER — Ambulatory Visit: Payer: Medicare Other | Admitting: Physical Therapy

## 2019-04-05 IMAGING — CR DG ABDOMEN 1V
3 series · 3 of 3 positions shown · non-contrast
Comparison: CT scan May 01, 2018

ADDENDUM:
There was a call from Dr. [REDACTED] asking whether the
"pancreatic duct stent" is visualized on this exam.

There is a biliary stent in place. However, this biliary stent
appears to be within the common bile duct. A common bile duct stent
was placed on May 02, 2018. A separate pancreatic duct stent is not
visualized. Recommend clinical correlation.
These results will be called to the ordering clinician or
representative by the Radiologist Assistant, and communication
documented in the PACS or zVision Dashboard.
CLINICAL DATA: Right lower quadrant abdominal pain for 3 days.
Postoperative cholecystectomy and catheter placement.
EXAM:
ABDOMEN - 1 VIEW

[w abdomen upright (1 of 3)]
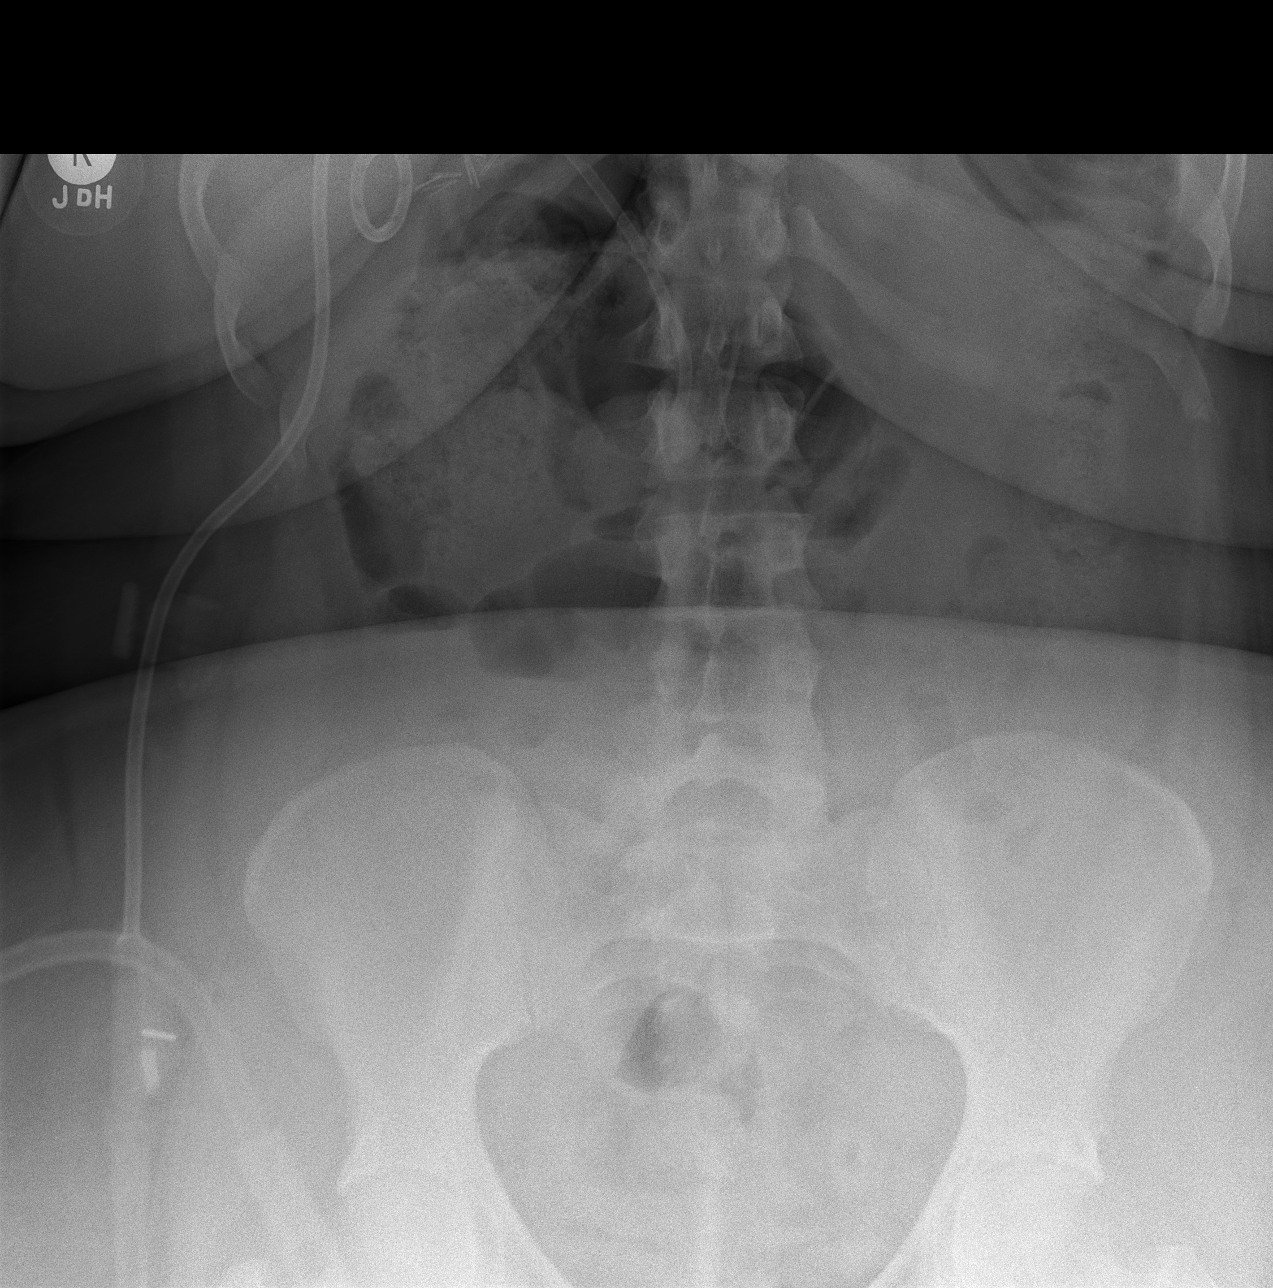

[w abdomen upright (2 of 3)]
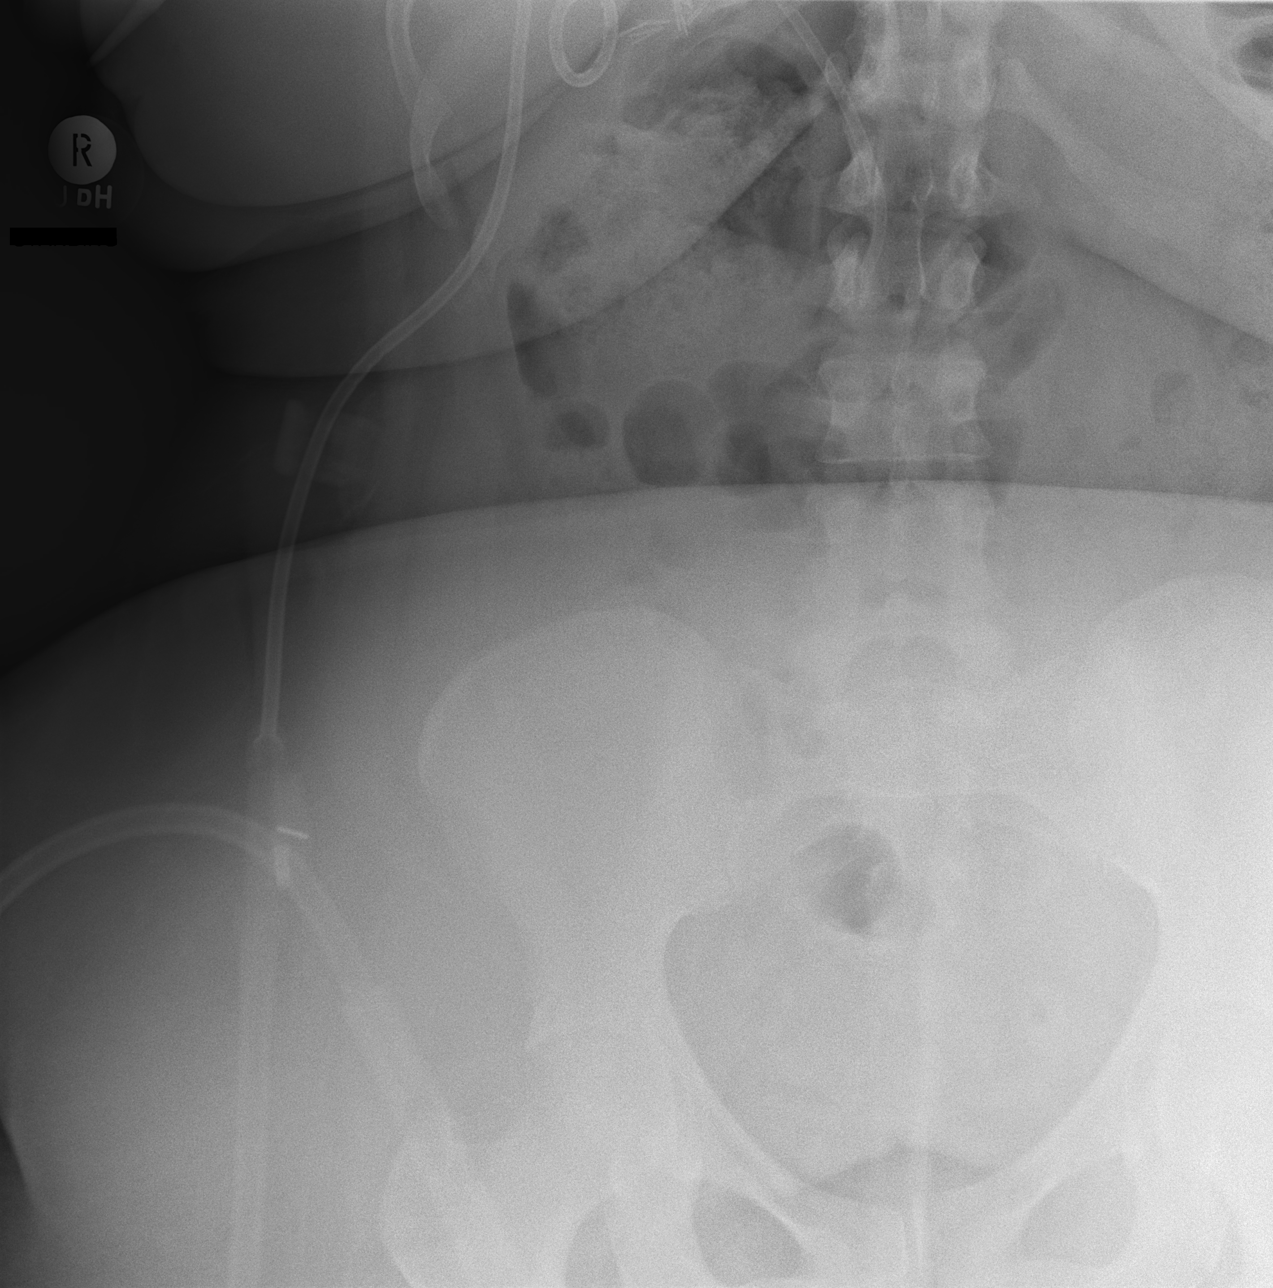

[w abdomen upright (3 of 3)]
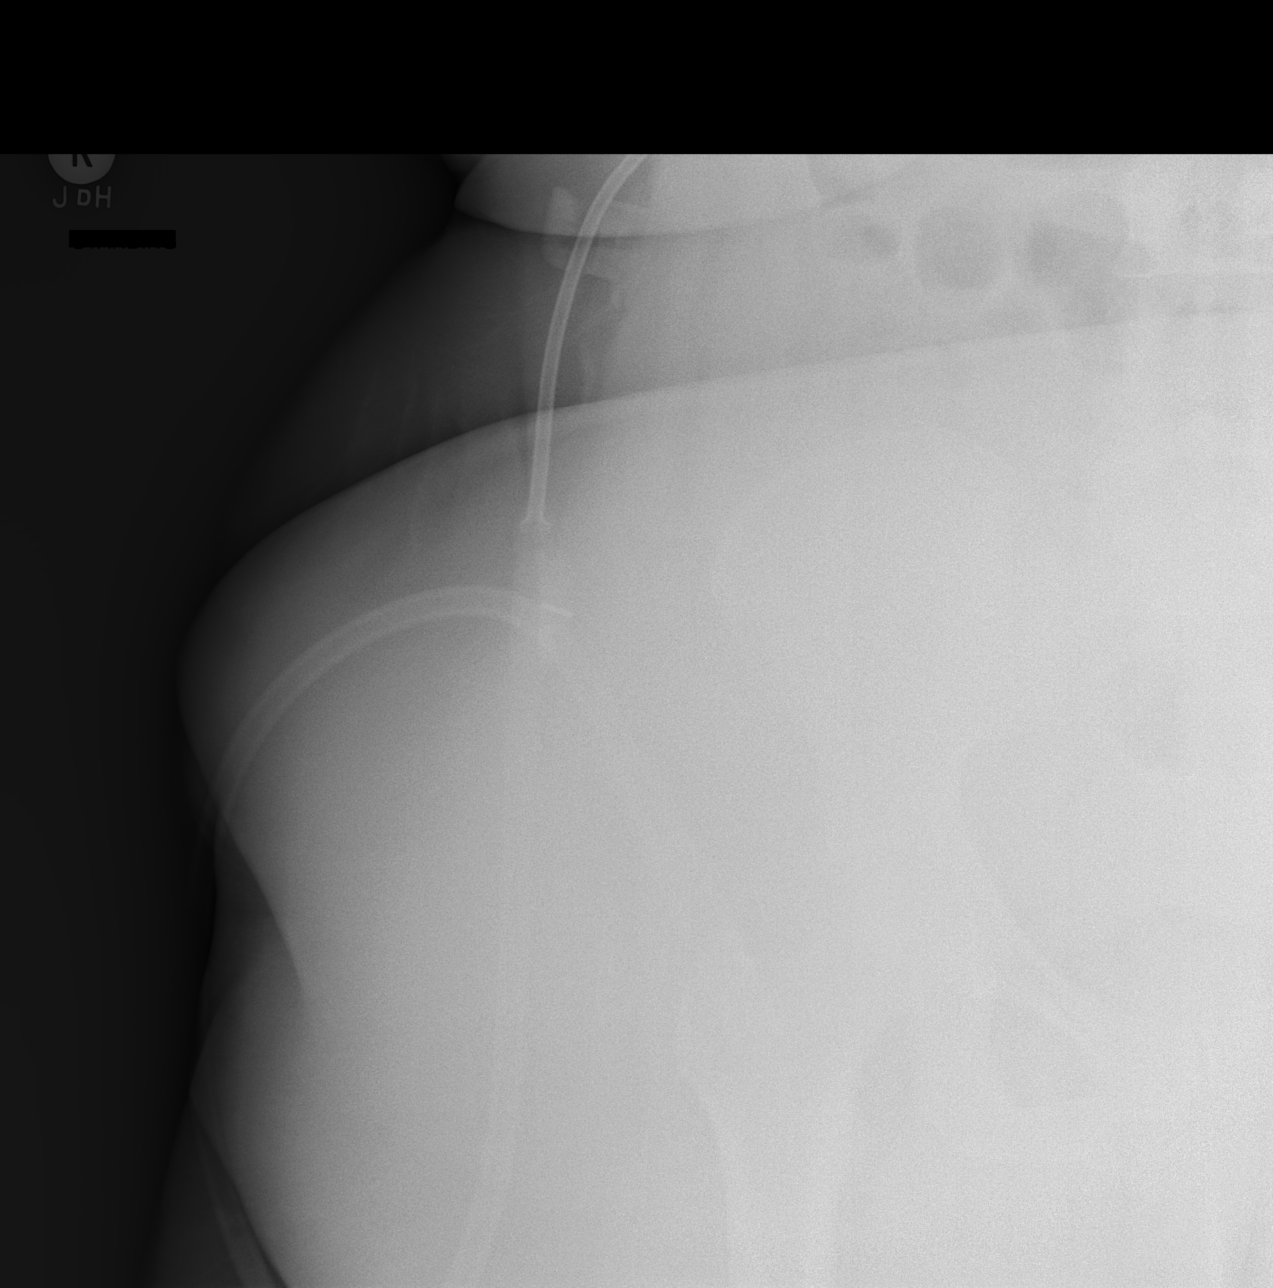

[3 of 3 positions shown; findings below may reference images not displayed]

FINDINGS: The right upper quadrant pigtail catheter is immediately adjacent to
this cholecystectomy clips. The tubing is intact. There is a common
bile duct stent identified. No other abnormalities.
IMPRESSION: Right upper quadrant pigtail catheter and common bile duct stent as
above.

## 2019-04-05 IMAGING — CT CT ABD-PELV W/ CM
2 of 5 series · 16 of 46 positions shown, 18 images · IV contrast (ISOVUE)
Comparison: 05/01/2018 and 04/28/2018

ADDENDUM:
No pancreatic ductal dilatation is noted.
CLINICAL DATA: Patient released from the hospital after having a
stent and drain placed go to graduation. Patient presents with
severe pain at the drain site and says that the clamp popped off
earlier.

EXAM:
CT ABDOMEN AND PELVIS WITH CONTRAST
TECHNIQUE: Multidetector CT imaging of the abdomen and pelvis was performed
using the standard protocol following bolus administration of
intravenous contrast.
CONTRAST:  100mL IRGFB2-7HH IOPAMIDOL (IRGFB2-7HH) INJECTION 61%

[Series 2: axial st · axial · 0.92mm/px · z∈[+1254,+1669]mm · 13 of 97 slices shown, 15 images]
[im 7/97  soft-tissue]
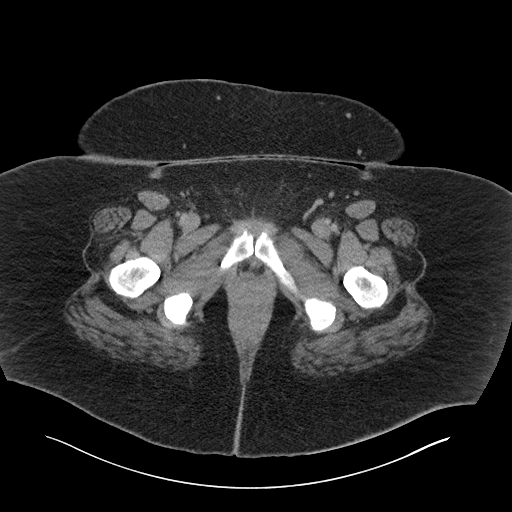
[im 7/97  bone]
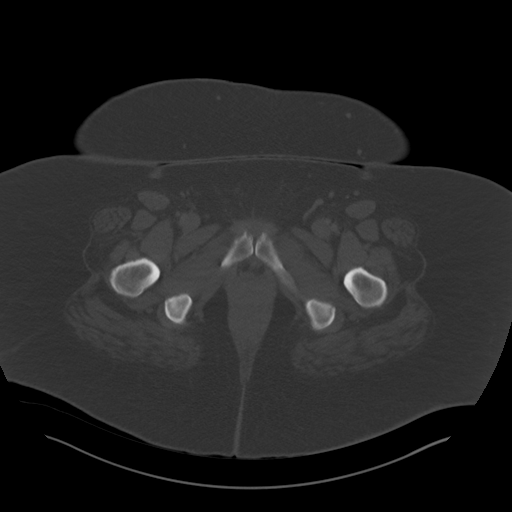
[im 13/97  soft-tissue]
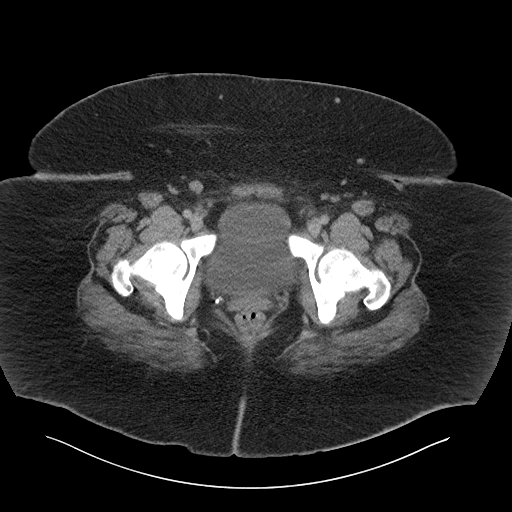
[im 20/97  soft-tissue]
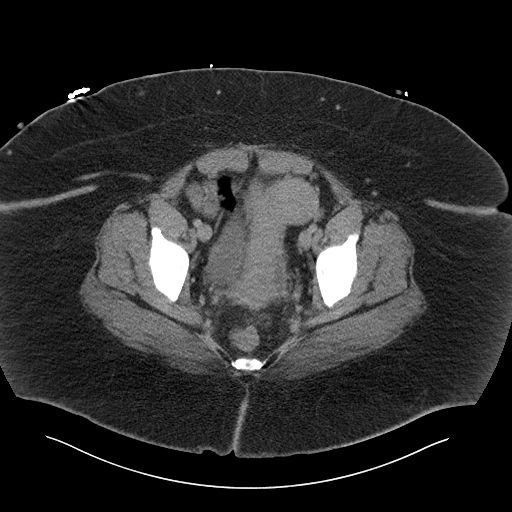
[im 26/97  soft-tissue]
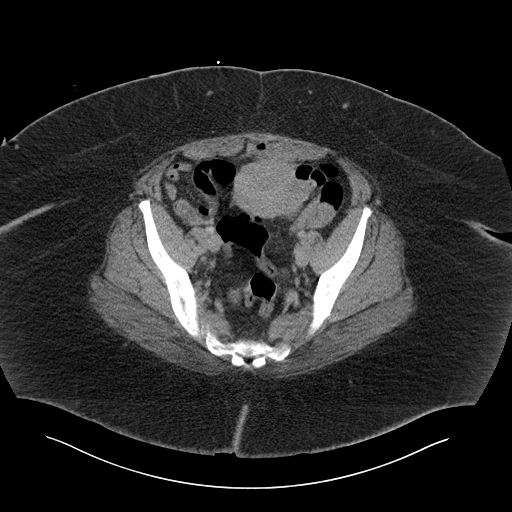
[im 33/97  soft-tissue]
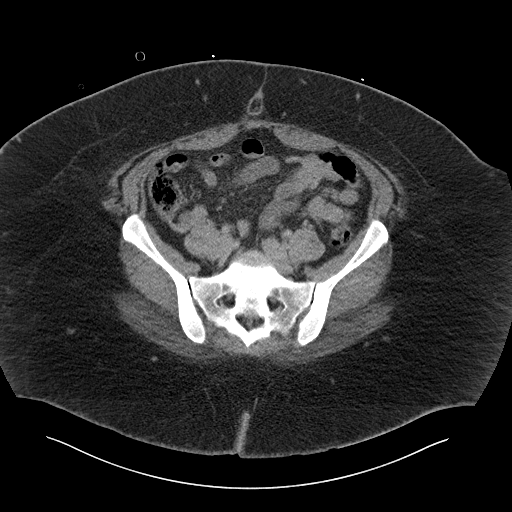
[im 39/97  soft-tissue]
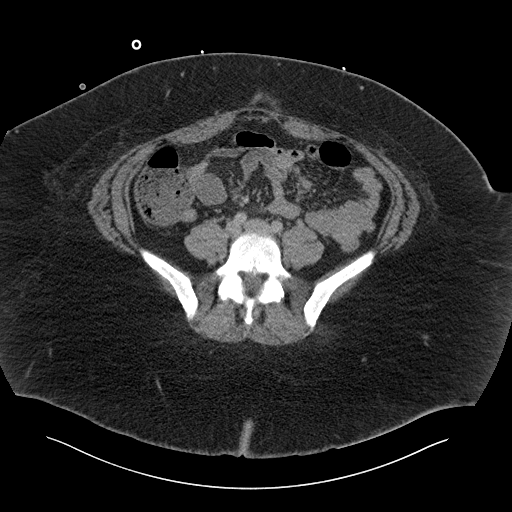
[im 52/97  soft-tissue]
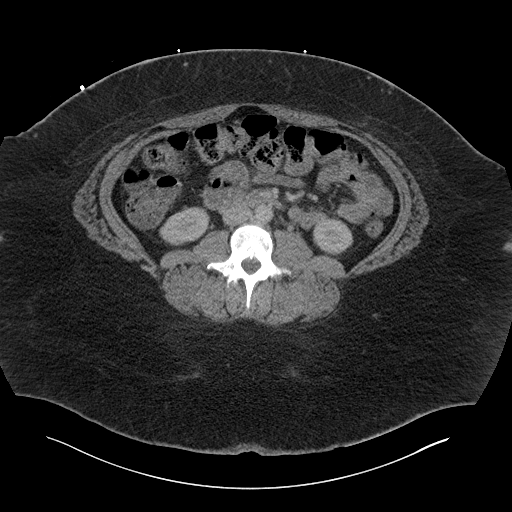
[im 58/97  soft-tissue]
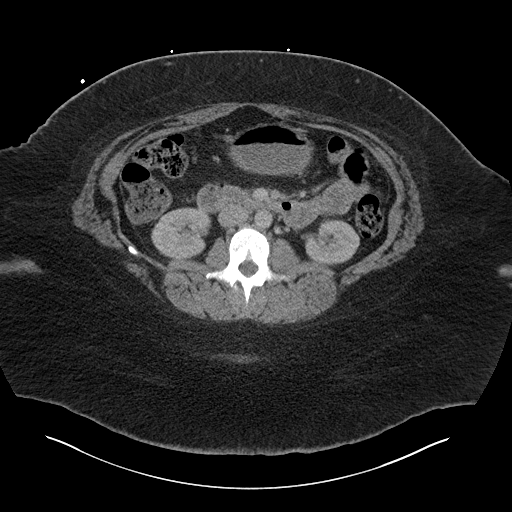
[im 65/97  soft-tissue]
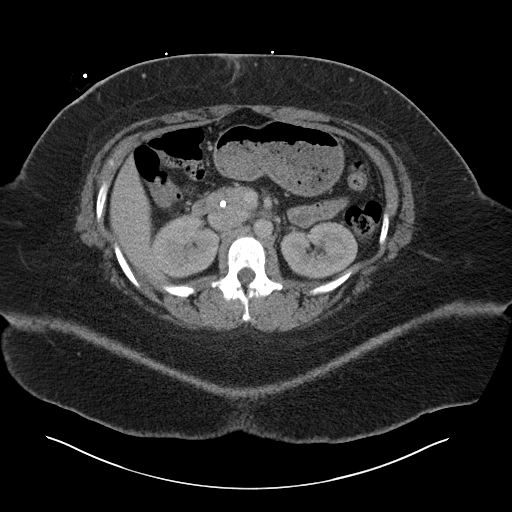
[im 65/97  bone]
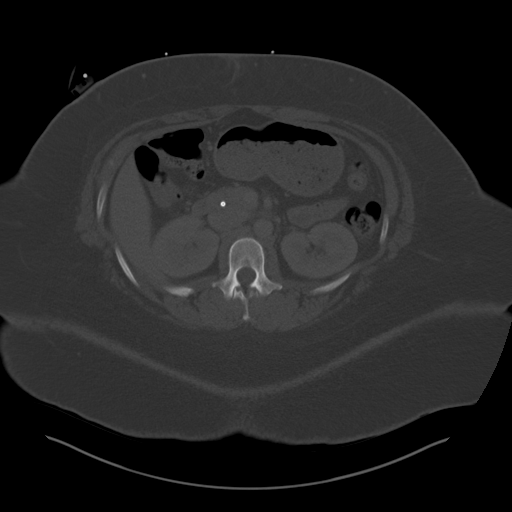
[im 71/97  soft-tissue]
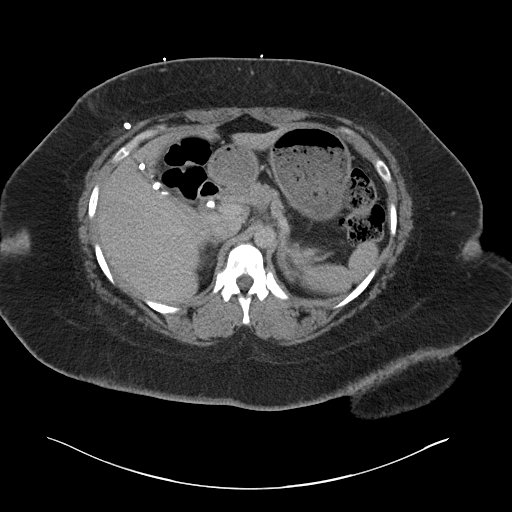
[im 77/97  soft-tissue]
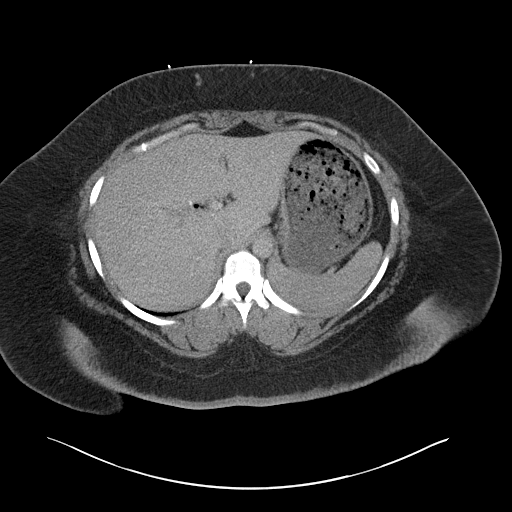
[im 84/97  soft-tissue]
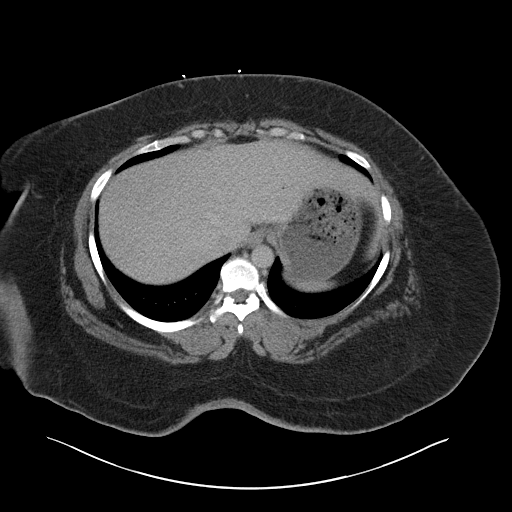
[im 90/97  soft-tissue]
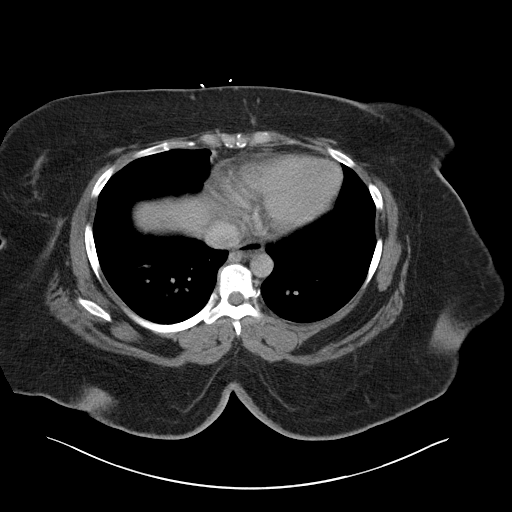

[Series 5: coronal st · coronal · 0.92mm/px · 3 of 121 slices shown]
[im 41/121  soft-tissue]
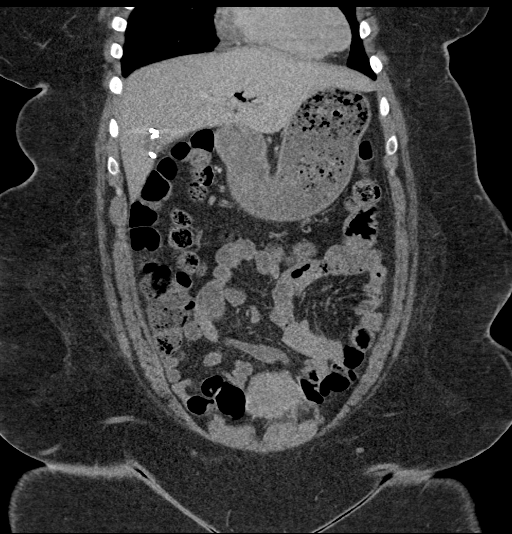
[im 54/121  soft-tissue]
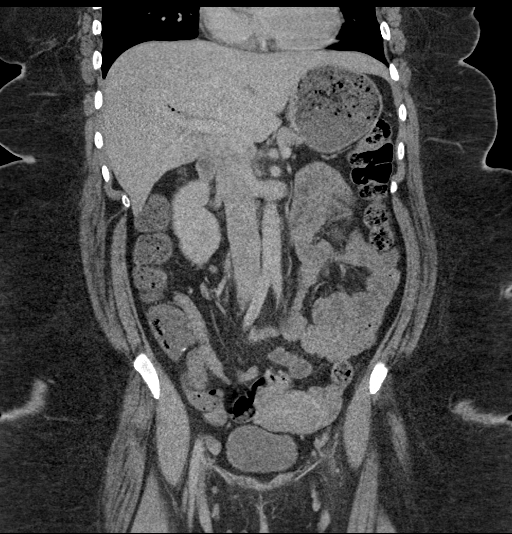
[im 67/121  soft-tissue]
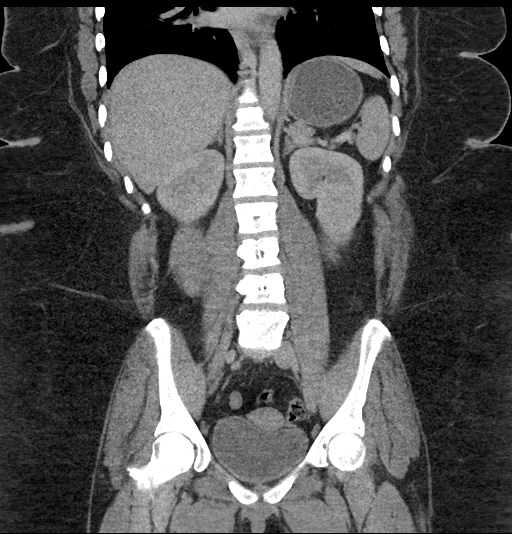

[16 of 46 positions shown; findings below may reference images not displayed]

FINDINGS: Lower chest: Normal size heart. No pericardial effusion. No active
pulmonary disease.

Hepatobiliary: Percutaneous drain in the gallbladder fossa is
identified without reaccumulation of fluid. Common bile duct stent
is also present in appropriate position. Pneumobilia from previous
intervention is seen. No space-occupying mass of the liver. No
abscess or free flowing fluid is identified.

Pancreas: Normal

Spleen: Normal

Adrenals/Urinary Tract: Normal

Stomach/Bowel: Stomach is within normal limits. Status post
appendectomy no evidence of bowel wall thickening, distention, or
inflammatory changes.

Vascular/Lymphatic: Circumaortic left renal vein. Nonaneurysmal
abdominal aorta. No adenopathy.

Reproductive: Redemonstration of fibroid uterus.  No adnexal mass.

Other: No free air nor free fluid. No abnormal fluid collection
along the course of the right upper quadrant percutaneous catheter.

Musculoskeletal: No acute osseous abnormality.
IMPRESSION: 1. No reaccumulation of gallbladder fossa fluid status post
percutaneous drainage placement.
2. Indwelling CBD stent is unremarkable in appearance. Pneumobilia
from recent intervention is seen.
3. No CT findings for the patient's pain at the drain site.
4. Fibroid uterus.

## 2019-05-26 ENCOUNTER — Other Ambulatory Visit: Payer: Self-pay | Admitting: Neurology

## 2019-05-26 DIAGNOSIS — S14104S Unspecified injury at C4 level of cervical spinal cord, sequela: Secondary | ICD-10-CM

## 2019-05-26 DIAGNOSIS — R252 Cramp and spasm: Secondary | ICD-10-CM

## 2019-08-11 ENCOUNTER — Emergency Department (HOSPITAL_COMMUNITY): Payer: Medicare Other

## 2019-08-11 ENCOUNTER — Other Ambulatory Visit: Payer: Self-pay

## 2019-08-11 ENCOUNTER — Emergency Department (HOSPITAL_COMMUNITY)
Admission: EM | Admit: 2019-08-11 | Discharge: 2019-08-12 | Disposition: A | Discharge status: Home / Self Care | Payer: Medicare Other | Attending: Emergency Medicine | Admitting: Emergency Medicine

## 2019-08-11 ENCOUNTER — Encounter (HOSPITAL_COMMUNITY): Payer: Self-pay | Admitting: Emergency Medicine

## 2019-08-11 DIAGNOSIS — Z87891 Personal history of nicotine dependence: Secondary | ICD-10-CM | POA: Insufficient documentation

## 2019-08-11 DIAGNOSIS — Z794 Long term (current) use of insulin: Secondary | ICD-10-CM | POA: Diagnosis not present

## 2019-08-11 DIAGNOSIS — J45909 Unspecified asthma, uncomplicated: Secondary | ICD-10-CM | POA: Insufficient documentation

## 2019-08-11 DIAGNOSIS — M549 Dorsalgia, unspecified: Secondary | ICD-10-CM

## 2019-08-11 DIAGNOSIS — R9389 Abnormal findings on diagnostic imaging of other specified body structures: Secondary | ICD-10-CM

## 2019-08-11 DIAGNOSIS — M25551 Pain in right hip: Secondary | ICD-10-CM | POA: Diagnosis present

## 2019-08-11 DIAGNOSIS — N939 Abnormal uterine and vaginal bleeding, unspecified: Secondary | ICD-10-CM

## 2019-08-11 DIAGNOSIS — N3001 Acute cystitis with hematuria: Secondary | ICD-10-CM | POA: Diagnosis not present

## 2019-08-11 DIAGNOSIS — R1030 Lower abdominal pain, unspecified: Secondary | ICD-10-CM | POA: Insufficient documentation

## 2019-08-11 DIAGNOSIS — E119 Type 2 diabetes mellitus without complications: Secondary | ICD-10-CM | POA: Diagnosis not present

## 2019-08-11 DIAGNOSIS — R935 Abnormal findings on diagnostic imaging of other abdominal regions, including retroperitoneum: Secondary | ICD-10-CM | POA: Insufficient documentation

## 2019-08-11 DIAGNOSIS — I10 Essential (primary) hypertension: Secondary | ICD-10-CM | POA: Insufficient documentation

## 2019-08-11 DIAGNOSIS — G894 Chronic pain syndrome: Secondary | ICD-10-CM | POA: Insufficient documentation

## 2019-08-11 DIAGNOSIS — N938 Other specified abnormal uterine and vaginal bleeding: Secondary | ICD-10-CM | POA: Diagnosis not present

## 2019-08-11 LAB — CBC
HCT: 31.7 % — ABNORMAL LOW (ref 36.0–46.0)
Hemoglobin: 9.8 g/dL — ABNORMAL LOW (ref 12.0–15.0)
MCH: 20.5 pg — ABNORMAL LOW (ref 26.0–34.0)
MCHC: 30.9 g/dL (ref 30.0–36.0)
MCV: 66.2 fL — ABNORMAL LOW (ref 80.0–100.0)
Platelets: 434 10*3/uL — ABNORMAL HIGH (ref 150–400)
RBC: 4.79 MIL/uL (ref 3.87–5.11)
RDW: 20 % — ABNORMAL HIGH (ref 11.5–15.5)
WBC: 8.7 10*3/uL (ref 4.0–10.5)
nRBC: 0 % (ref 0.0–0.2)

## 2019-08-11 LAB — URINALYSIS, ROUTINE W REFLEX MICROSCOPIC
Bilirubin Urine: NEGATIVE
Glucose, UA: NEGATIVE mg/dL
Ketones, ur: NEGATIVE mg/dL
Nitrite: POSITIVE — AB
Protein, ur: 30 mg/dL — AB
RBC / HPF: >50 RBC/hpf — ABNORMAL HIGH (ref 0–5)
Specific Gravity, Urine: >1.046 — ABNORMAL HIGH (ref 1.005–1.030)
WBC, UA: >50 WBC/hpf — ABNORMAL HIGH (ref 0–5)
pH: 6 (ref 5.0–8.0)

## 2019-08-11 LAB — I-STAT BETA HCG BLOOD, ED (MC, WL, AP ONLY): I-stat hCG, quantitative: <5 m[IU]/mL (ref <5)

## 2019-08-11 LAB — COMPREHENSIVE METABOLIC PANEL
ALT: 21 U/L (ref 0–44)
AST: 16 U/L (ref 15–41)
Albumin: 3.1 g/dL — ABNORMAL LOW (ref 3.5–5.0)
Alkaline Phosphatase: 87 U/L (ref 38–126)
Anion gap: 13 (ref 5–15)
BUN: 10 mg/dL (ref 6–20)
CO2: 24 mmol/L (ref 22–32)
Calcium: 9.2 mg/dL (ref 8.9–10.3)
Chloride: 102 mmol/L (ref 98–111)
Creatinine, Ser: 0.89 mg/dL (ref 0.44–1.00)
GFR calc Af Amer: >60 mL/min (ref >60)
GFR calc non Af Amer: >60 mL/min (ref >60)
Glucose, Bld: 156 mg/dL — ABNORMAL HIGH (ref 70–99)
Potassium: 3.8 mmol/L (ref 3.5–5.1)
Sodium: 139 mmol/L (ref 135–145)
Total Bilirubin: 0.5 mg/dL (ref 0.3–1.2)
Total Protein: 7.5 g/dL (ref 6.5–8.1)

## 2019-08-11 LAB — LIPASE, BLOOD: Lipase: 22 U/L (ref 11–51)

## 2019-08-11 MED ORDER — HYDROMORPHONE HCL 1 MG/ML IJ SOLN
1.0000 mg | Freq: Once | INTRAMUSCULAR | Status: AC
  Administered 2019-08-11: 1 mg via INTRAVENOUS
  Filled 2019-08-11: qty 1

## 2019-08-11 MED ORDER — IOHEXOL 300 MG/ML  SOLN
100.0000 mL | Freq: Once | INTRAMUSCULAR | Status: AC | PRN
  Administered 2019-08-11: 100 mL via INTRAVENOUS

## 2019-08-11 MED ORDER — ONDANSETRON 4 MG PO TBDP: 4.0000 mg | ORAL_TABLET | Freq: Three times a day (TID) | ORAL | 0 refills | Status: DC | PRN

## 2019-08-11 MED ORDER — CEPHALEXIN 500 MG PO CAPS: 500.0000 mg | ORAL_CAPSULE | Freq: Four times a day (QID) | ORAL | 0 refills | Status: AC

## 2019-08-11 MED ORDER — ONDANSETRON HCL 4 MG/2ML IJ SOLN
4.0000 mg | Freq: Once | INTRAMUSCULAR | Status: AC
  Administered 2019-08-11: 4 mg via INTRAVENOUS
  Filled 2019-08-11: qty 2

## 2019-08-11 MED ORDER — FENTANYL CITRATE (PF) 100 MCG/2ML IJ SOLN
100.0000 ug | Freq: Once | INTRAMUSCULAR | Status: AC
  Administered 2019-08-11: 100 ug via INTRAVENOUS
  Filled 2019-08-11: qty 2

## 2019-08-11 MED ORDER — KETOROLAC TROMETHAMINE 30 MG/ML IJ SOLN
30.0000 mg | Freq: Once | INTRAMUSCULAR | Status: DC
  Filled 2019-08-11: qty 1

## 2019-08-11 MED ORDER — SODIUM CHLORIDE 0.9 % IV SOLN
1.0000 g | Freq: Once | INTRAVENOUS | Status: AC
  Administered 2019-08-11: 1 g via INTRAVENOUS
  Filled 2019-08-11: qty 10

## 2019-08-11 MED ORDER — SODIUM CHLORIDE 0.9% FLUSH
3.0000 mL | Freq: Once | INTRAVENOUS | Status: AC
  Administered 2019-08-11: 3 mL via INTRAVENOUS

## 2019-08-11 NOTE — ED Notes (Signed)
Urine culture collected with urine analysis earlier. Main Lab confirmed receipt.

## 2019-08-11 NOTE — Discharge Instructions (Addendum)
Please take all of your antibiotics until finished!   Take your antibiotics with food.  Common side effects of antibiotics include nausea, vomiting, abdominal discomfort, and diarrhea. You may help offset some of this with probiotics which you can buy or get in yogurt. Do not eat  or take the probiotics until 2 hours after your antibiotic.    Some studies suggest that certain antibiotics can reduce the efficacy of certain oral contraceptive pills (birth control), so please use additional contraceptives (condoms or other barrier method) while you are taking the antibiotics and for an additional 5 to 7 days afterwards if you are a female on these medications.  You can take Zofran as needed for nausea and vomiting.  Let this medicine dissolve under your tongue and wait around 10 to 15 minutes before having anything to eat or drink to give this medication time to work.  Continue to take your home medications as prescribed.  Drink plenty of fluids and get plenty of rest.  Follow-up with your OB/GYN for reevaluation of your abnormal ultrasound which suggested adenomyosis of the uterus.  Return to the emergency department immediately for any concerning signs or symptoms develop such as high fevers, persistent vomiting, loss of consciousness, or severe worsening pain.

## 2019-08-11 NOTE — ED Triage Notes (Signed)
Pt states she fell a month ago and has been terying to deal  With it having abd  Pain and  Having left sided pain , states she spoke to dr but did not see  Them,  States  hert period is on and bleeding heavy

## 2019-08-11 NOTE — ED Provider Notes (Signed)
[facility] EMERGENCY DEPARTMENT Provider Note   CSN: [id number] Arrival date & time: 08/11/19  1217     History   Chief Complaint Chief Complaint  Patient presents with  . Fall  . Abdominal Pain  . Vaginal Bleeding    HPI Cynthia Rosales is a 38 y.o. female with history of C4 spinal cord injury, spastic tetraplegia, morbid obesity, type 2 diabetes mellitus, hypertension, chronic pain syndrome, postoperative ileus, ependymoma of spinal cord presents for evaluation of acute onset, persistent and progressively worsening right hip pain for 3 weeks as well as lower abdominal pain for several weeks.  She reports that she is "numb from the waist down "chronically ever since her ependymoma removal surgery in 2016.  She typically uses an assistive device to help her ambulate and she was using her rolling walker to walk to the bathroom when her legs gave out which they are prone to doing at baseline.  She reports that she landed on her left hip and since then has had sharp shooting pains radiating down from the left hip into her back and down the left lower extremity.  She also reports sharp lower abdominal pain and upper abdominal pain that has been progressively worsening over the last few months into the last couple of weeks.  She notes nausea but no vomiting.  Bowel movements have been normal but she is incontinent of urine and stool at baseline.  She states for the last 3 menstrual cycles her bleeding has been irregular and more heavy.  She began a menstrual cycle yesterday which was earlier than expected and reports that she has had significantly more bleeding than usual in her diaper.  She has been taking an increased dose of her chronic narcotics at home with relief of symptoms.    The history is provided by the patient.    Past Medical History:  Diagnosis Date  . Abscess of bursa, left elbow 06/2013   Treated with I and D/antibiotics.   . Anemia   . Asthma   .  Depression   . History of blood transfusion    "after I had one of my kids"  . Hypertension   . Ovarian cyst   . Renal disorder    kidney stones  . Type II diabetes mellitus [facility])     Patient Active Problem List   Diagnosis Date Noted  . Bile leak, postoperative   . Choledocholithiasis   . Intra-abdominal fluid collection   . Anemia 04/28/2018  . Morbid obesity with body mass index (BMI) of 50.0 to 59.9 in adult ([place]) 04/02/2018  . Mass of uterus 04/02/2018  . Transaminitis 04/02/2018  . Acute cholecystitis s/p lap cholecystectomy 04/03/2018 04/01/2018  . Pregnancy, ectopic, cornual or cervical 07/21/2017  . 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  . Neurogenic bladder 08/23/2015  . Bilateral carpal tunnel syndrome 08/23/2015  . Paraparesis of both lower limbs ([place]) 07/14/2015  . Ileus, postoperative ([place]) 12/25/2014  . Diabetes mellitus type 2 in obese ([place]) 12/25/2014  . Ependymoma of spinal cord ([place]) 12/24/2014  . 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[place]) 12/24/2014  . 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[place]) 12/24/2014  . Ileus of unspecified type ([place])   . Atelectasis   . Bowel obstruction ([place])   . Encounter for central line care   . Ileus ([place])   . Intestinal occlusion ([facility])   . 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  . Headache 11/19/2014  . Neck pain 11/19/2014  . Right sided weakness 11/19/2014  . Paresthesias 11/19/2014  .  Nightmares 09/11/2014  . HTN (hypertension), benign 03/28/2014  . Nephrolithiasis 03/27/2014  . Diabetes mellitus ([place]) 03/27/2014  . Abdominal pain 03/27/2014    Past Surgical History:  Procedure Laterality Date  . APPENDECTOMY  2013  . BILIARY STENT PLACEMENT N/A 05/02/2018   Procedure: BILIARY STENT PLACEMENT;  Surgeon: Doran Stabler, MD;  [facility]: [facility];  Service: Gastroenterology;  Laterality: N/A;  . CARPAL TUNNEL RELEASE    . CESAREAN SECTION  2000; 2007; 2011  . CHOLECYSTECTOMY N/A 04/03/2018   Procedure: LAPAROSCOPIC CHOLECYSTECTOMY;  Surgeon: Rolm Bookbinder, MD;   Location: [facility];  Service: General;  Laterality: N/A;  . DILATION AND CURETTAGE OF UTERUS    . ERCP N/A 05/02/2018   Procedure: ENDOSCOPIC RETROGRADE CHOLANGIOPANCREATOGRAPHY (ERCP);  Surgeon: Doran Stabler, MD;  Location: [facility];  Service: Gastroenterology;  Laterality: N/A;  . ERCP N/A 07/26/2018   Procedure: ENDOSCOPIC RETROGRADE CHOLANGIOPANCREATOGRAPHY (ERCP);  Surgeon: Irene Shipper, MD;  Location: [facility];  Service: Endoscopy;  Laterality: N/A;  stent removal  . IR RADIOLOGIST EVAL & MGMT  05/19/2018  . LAMINECTOMY N/A 12/13/2014   Procedure: CERVICAL LAMINECTOMY FOR INTRADURAL TUMOR;  Surgeon: Charlie Pitter, MD;  Location: [facility];  Service: Neurosurgery;  Laterality: N/A;  posterior  . LAPAROSCOPY N/A 07/20/2017   Procedure: LAPAROSCOPY OPERATIVE WITH WEDGE RESECTION RIGHT CORNUA AND PARTIAL SALPINGECTOMY;  Surgeon: [name], MD;  Location: [facility];  Service: Gynecology;  Laterality: N/A;  . REDUCTION MAMMAPLASTY Bilateral 1998  . REMOVAL OF STONES  05/02/2018   Procedure: REMOVAL OF STONES;  Surgeon: Doran Stabler, MD;  [facility]: [facility];  Service: Gastroenterology;;  . [name]  05/02/2018   Procedure: SPHINCTEROTOMY;  Surgeon: Doran Stabler, MD;  [facility];  Service: Gastroenterology;;  . Lavell Islam REMOVAL  07/26/2018   Procedure: STENT REMOVAL;  Surgeon: Irene Shipper, MD;  [facility];  Service: Endoscopy;;     OB History    Gravida  5   Para  3   Term  2   Preterm  1   AB      Living  3     SAB      TAB      Ectopic      Multiple      Live Births  3            Home Medications    Prior to Admission medications   Medication Sig Start Date End Date Taking? Authorizing Provider  albuterol (PROVENTIL HFA;VENTOLIN HFA) 108 (90 Base) MCG/ACT inhaler Inhale 2 puffs into the lungs every 6 (six) hours as needed for wheezing or shortness of breath.    [provider]  cephALEXin (KEFLEX) 500 MG  capsule Take 1 capsule (500 mg total) by mouth 4 (four) times daily for 7 days. 08/11/19 08/18/19  Marnita Poirier A, PA-C  diazepam (VALIUM) 5 MG tablet TAKE 1 TABLET BY MOUTH TWICE DAILY AS NEEDED FOR SPASTICITY. 05/29/19   Melvenia Beam, MD  etodolac (LODINE) 300 MG capsule Take 1 capsule (300 mg total) by mouth 3 (three) times daily as needed. Patient not taking: Reported on 12/19/2018 08/22/18   Dorie Rank, MD  hydrochlorothiazide (HYDRODIURIL) 25 MG tablet Take 25 mg by mouth daily.  04/27/18   [provider]  ibuprofen (ADVIL,MOTRIN) 800 MG tablet Take 800 mg by mouth every 8 (eight) hours as needed for mild pain or moderate pain.    [provider]  insulin NPH-regular Human (NOVOLIN 70/30) (70-30) 100 UNIT/ML injection Inject 34-36 Units into the skin See admin instructions. Inject 34 units SQ in the morning, inject 36 units SQ in the afternoon and 36 units after supper    [provider]  ondansetron (ZOFRAN ODT) 4 MG disintegrating tablet Take 1 tablet (4 mg total) by mouth every 8 (eight) hours as needed for nausea or vomiting. 08/11/19   Rodell Perna A, PA-C  oxycodone (ROXICODONE) 30 MG immediate release tablet Take 1 tablet (30 mg total) by mouth every 6 (six) hours as needed for pain. 05/04/18   Tresa Garter, MD  sitaGLIPtin (JANUVIA) 100 MG tablet Take 100 mg by mouth daily.    [provider]    Family History Family History  Problem Relation Age of Onset  . Diabetes Mother   . Hypertension Mother   . Breast cancer Maternal Aunt   . Ovarian cancer Maternal Grandmother   . Breast cancer Maternal Aunt   . Migraines Neg Hx     Social History Social History   Tobacco Use  . Smoking status: Former Smoker    Years: 0.00  . Smokeless tobacco: Never Used  . Tobacco comment: 03/27/2014 "smoked ~ 1 cigarette/day; quit in ~ 2013"  Substance Use Topics  . Alcohol use: Never    Alcohol/week: 0.0 standard drinks    Frequency: Never  . Drug use:  No     Allergies   Amoxicillin, Buprenorphine hcl, and Morphine and related   Review of Systems Review of Systems  Constitutional: Negative for chills and fever.  Respiratory: Negative for choking and shortness of breath.   Gastrointestinal: Positive for abdominal pain and nausea. Negative for vomiting.  Musculoskeletal: Positive for arthralgias and back pain.  Neurological: Positive for weakness (baseline, chronic and unchanged).  All other systems reviewed and are negative.    Physical Exam Updated Vital Signs BP 123/72   Pulse 95   Temp 98.6 F (37 C) (Oral)   Resp 20   SpO2 99%   Physical Exam Vitals signs and nursing note reviewed.  Constitutional:      General: She is not in acute distress.    Appearance: She is well-developed. She is obese.  HENT:     Head: Normocephalic and atraumatic.  Eyes:     General:        Right eye: No discharge.        Left eye: No discharge.     Conjunctiva/sclera: Conjunctivae normal.  Neck:     Vascular: No JVD.     Trachea: No tracheal deviation.  Cardiovascular:     Rate and Rhythm: Normal rate.     Heart sounds: Normal heart sounds.     Comments: 2+ radial and DP/PT pulses bilaterally, Homans sign absent bilaterally, no lower extremity edema, no palpable cords, compartments are soft  Pulmonary:     Effort: Pulmonary effort is normal.     Breath sounds: Normal breath sounds.  Abdominal:     General: A surgical scar is present. Bowel sounds are normal. There is no distension.     Palpations: Abdomen is soft.     Tenderness: There is generalized abdominal tenderness. There is no guarding or rebound.  Musculoskeletal:     Comments: Tenderness to palpation of the lateral and anterior aspect of the left hip.  Limited range of motion due to body habitus and chronic C4 spinal injury.  Skin:    General: Skin is warm and  dry.     Findings: No erythema.  Neurological:     Mental Status: She is alert.     Comments: Weakness of  the upper and lower extremities bilaterally, at baseline per the patient.  Altered sensation to light touch of the bilateral lower extremities which patient also states is chronic and unchanged  Psychiatric:        Behavior: Behavior normal.      ED Treatments / Results  Labs (all labs ordered are listed, but only abnormal results are displayed) Labs Reviewed  COMPREHENSIVE METABOLIC PANEL - Abnormal; Notable for the following components:      Result Value   Glucose, Bld 156 (*)    Albumin 3.1 (*)    All other components within normal limits  CBC - Abnormal; Notable for the following components:   Hemoglobin 9.8 (*)    HCT 31.7 (*)    MCV 66.2 (*)    MCH 20.5 (*)    RDW 20.0 (*)    Platelets 434 (*)    All other components within normal limits  URINALYSIS, ROUTINE W REFLEX MICROSCOPIC - Abnormal; Notable for the following components:   APPearance HAZY (*)    Specific Gravity, Urine >1.046 (*)    Hgb urine dipstick LARGE (*)    Protein, ur 30 (*)    Nitrite POSITIVE (*)    Leukocytes,Ua TRACE (*)    RBC / HPF >50 (*)    WBC, UA >50 (*)    Bacteria, UA RARE (*)    All other components within normal limits  URINE CULTURE  LIPASE, BLOOD  I-STAT BETA HCG BLOOD, ED ([facility], [facility], [facility] ONLY)    EKG None  Radiology Ct Abdomen Pelvis W Contrast  Result Date: 08/11/2019 CLINICAL DATA:  Patient with left-sided abdominal pain status post fall. EXAM: CT ABDOMEN AND PELVIS WITH CONTRAST TECHNIQUE: Multidetector CT imaging of the abdomen and pelvis was performed using the standard protocol following bolus administration of intravenous contrast. CONTRAST:  11mL OMNIPAQUE IOHEXOL 300 MG/ML  SOLN COMPARISON:  CT abdomen pelvis 07/15/2018 FINDINGS: Lower chest: Normal heart size. Lung bases are clear. No pleural effusion. Hepatobiliary: Liver is normal in size and contour. No focal hepatic lesion is identified. Gallbladder surgically absent. Mild central intrahepatic biliary ductal dilatation.  Pancreas: Unremarkable Spleen: Unremarkable Adrenals/Urinary Tract: Normal adrenal glands. Kidneys enhance symmetrically with contrast. No hydronephrosis. Urinary bladder is unremarkable. Stomach/Bowel: Sigmoid colonic diverticulosis. No CT evidence for acute diverticulitis. No abnormal bowel wall thickening or evidence for bowel obstruction. No free fluid or free intraperitoneal air. Normal morphology of the stomach. Vascular/Lymphatic: Normal caliber abdominal aorta. No retroperitoneal lymphadenopathy. Reproductive: Enlarged fibroid uterus. Other: None. Musculoskeletal: Lumbar spine degenerative changes. No aggressive or acute appearing osseous lesions. IMPRESSION: No acute process within the abdomen or pelvis. Electronically Signed   By: Lovey Newcomer M.D.   On: 08/11/2019 21:31   Ct L-spine No Charge  Result Date: 08/11/2019 CLINICAL DATA:  38 year old female with fall, left side abdominal pain. EXAM: CT LUMBAR SPINE WITH CONTRAST TECHNIQUE: Technique: Multiplanar CT images of the lumbar spine were reconstructed from contemporary CT of the Abdomen and Pelvis. CONTRAST:  No additional COMPARISON:  CT Abdomen and Pelvis today reported separately. Lumbar MRI 09/03/2015. FINDINGS: Segmentation: Normal, the same numbering system on the 2016 MRI. Alignment: Stable lumbar lordosis. Vertebrae: No acute osseous abnormality identified. Intact visible sacrum and SI joints. Paraspinal and other soft tissues: Abdominal and pelvic viscera are reported separately today. Paraspinal soft tissues are within  normal limits. Large body habitus. Disc levels: T11-T12: Mild disc bulge suspected with no significant stenosis. T12-L1:  Negative. L1-L2:  Mild disc bulge suspected with no significant stenosis. L2-L3:  Mild facet hypertrophy.  No stenosis. L3-L4:  Mild facet hypertrophy.  No stenosis. L4-L5: Minor disc bulge with mild endplate spurring. Mild to moderate facet hypertrophy greater on the right. No stenosis. L5-S1: Mild to  moderate facet hypertrophy, greater on the right with right side vacuum facet. Mild disc bulge and endplate spurring, with a right paracentral posterior component which appears increased since 2016 (series 9, image 107). This effaces the descending right S1 nerve roots in the lateral recess. No spinal stenosis. Mild to moderate right greater than left L5 foraminal stenosis appears increased. IMPRESSION: 1.  No acute osseous abnormality in the lumbar spine. 2. Suspect progressed L5-S1 degeneration since the 2016 MRI. Advanced facet arthropathy on the right, and rightward disc degeneration. Query right L5 and/or S1 radiculitis. There is also mild to moderate left L5 foraminal stenosis. 3. Relatively mild lumbar spine degeneration elsewhere. Mild to moderate facet hypertrophy at L5-S1 is also greater on the right. 4.  CT Chest, Abdomen, and Pelvis today are reported separately. Electronically Signed   By: Genevie Ann M.D.   On: 08/11/2019 21:48   US Pelvic Complete W Transvaginal And Torsion R/o  Result Date: 08/11/2019 CLINICAL DATA:  Heavy vaginal bleeding. EXAM: TRANSABDOMINAL AND TRANSVAGINAL ULTRASOUND OF PELVIS DOPPLER ULTRASOUND OF OVARIES TECHNIQUE: Both transabdominal and transvaginal ultrasound examinations of the pelvis were performed. Transabdominal technique was performed for global imaging of the pelvis including uterus, ovaries, adnexal regions, and pelvic cul-de-sac. It was necessary to proceed with endovaginal exam following the transabdominal exam to visualize the endometrium. Color and duplex Doppler ultrasound was utilized to evaluate blood flow to the ovaries. COMPARISON:  None. FINDINGS: Uterus Measurements: 15.4 x 6.0 x 10.8 cm = volume: 520 mL. 5.7 cm anterior fibroid identified, but not well seen due to technical factors. Heterogeneous echotexture raises the question of adenomyosis. Endometrium Thickness: Only visualized transabdominally with 5 mm stripe thickness period. No focal abnormality  visualized. Right ovary Measurements: Not visualized period Left ovary Measurements: 2.8 x 1.3 x 1.4 cm = volume: 2.7 mL. Only visualized transabdominally Pulsed Doppler evaluation of the left ovary demonstrates normal low-resistance arterial and venous waveforms. Other findings No abnormal free fluid. IMPRESSION: 1. Nonvisualization of the right ovary on transabdominal and endovaginal scanning. 2. Globular heterogeneous uterus raises the question of adenomyosis. Probable anterior fibroid approaching 6 cm but not well demonstrated due to difficult visualization secondary to technical factors/body habitus. 3. Endometrial stripe thickness of 5 mm trans abdominally. Electronically Signed   By: Misty Stanley M.D.   On: 08/11/2019 18:50   Dg Hip Unilat With Pelvis 2-3 Views Left  Result Date: 08/11/2019 CLINICAL DATA:  Acute LEFT hip pain following fall 3 weeks ago. Initial encounter. EXAM: DG HIP (WITH OR WITHOUT PELVIS) 2-3V LEFT COMPARISON:  11/15/2017 FINDINGS: No acute fracture, subluxation or dislocation. Mild joint space narrowing in both hips noted. No suspicious focal bony lesions are present. IMPRESSION: 1. No acute abnormality 2. Mild bilateral hip joint space narrowing. Electronically Signed   By: Margarette Canada M.D.   On: 08/11/2019 19:01    Procedures Procedures (including critical care time)  Medications Ordered in ED Medications  ketorolac (TORADOL) 30 MG/ML injection 30 mg (30 mg Intravenous Not Given 08/11/19 2049)  sodium chloride flush (NS) 0.9 % injection 3 mL (3 mLs Intravenous Given 08/11/19  1901)  HYDROmorphone (DILAUDID) injection 1 mg (1 mg Intravenous Given 08/11/19 1900)  ondansetron (ZOFRAN) injection 4 mg (4 mg Intravenous Given 08/11/19 1900)  iohexol (OMNIPAQUE) 300 MG/ML solution 100 mL (100 mLs Intravenous Contrast Given 08/11/19 2012)  fentaNYL (SUBLIMAZE) injection 100 mcg (100 mcg Intravenous Given 08/11/19 2131)  cefTRIAXone (ROCEPHIN) 1 g in sodium chloride 0.9 % 100 mL  IVPB (1 g Intravenous New Bag/Given 08/11/19 2258)     Initial Impression / Assessment and Plan / ED Course  I have reviewed the triage vital signs and the nursing notes.  Pertinent labs & imaging results that were available during my care of the patient were reviewed by me and considered in my medical decision making (see chart for details).  Patient presenting for evaluation of right hip pain after mechanical fall 3 weeks ago, chronic low back pain, abdominal pain and abnormal vaginal bleeding for the last 3 months intermittently.  She is afebrile, initially mildly tachycardic and hypertensive with resolution on subsequent reevaluation's.  She is nontoxic in appearance.  She has chronic neurologic deficits secondary to C4 spinal cord injury but states she is at her neurologic baseline.   Lab work today shows no leukocytosis, stable anemia, no metabolic derangements, no renal insufficiency.  Her UA obtained via in and out cath does suggest UTI, will culture.  Will give IV Rocephin which she has tolerated in the ED previously.  Pelvic ultrasound shows evidence of possible adenomyosis and anterior fibroid but no evidence of concerning pelvic mass or ovarian torsion.  Imaging of the abdomen pelvis and CT L-spine show no acute surgical abdominal pathology or concerning lumbar spine injury.  Doubt cauda equina syndrome. Radiographs of the hip show no acute osseous abnormality.  She was given pain medicine in the ED.  On reevaluation resting comfortably in no apparent distress.  Serial abdominal examinations remain benign and she is tolerating p.o. fluids in the ED without difficulty.  Recommend follow-up with OB/GYN for reevaluation of her dysfunctional uterine bleeding.  Will treat with outpatient course of Keflex for her UTI.  Discussed strict ED return precautions. Patient verbalized understanding of and agreement with plan and is safe for discharge home at this time.   Final Clinical Impressions(s) /  ED Diagnoses   Final diagnoses:  Back pain  Acute cystitis with hematuria  Abnormal pelvic ultrasound  DUB (dysfunctional uterine bleeding)    ED Discharge Orders         Ordered    cephALEXin (KEFLEX) 500 MG capsule  4 times daily     08/11/19 2305    ondansetron (ZOFRAN ODT) 4 MG disintegrating tablet  Every 8 hours PRN     08/11/19 2305           Renita Papa, PA-C 08/11/19 2356    Virgel Manifold, MD 08/13/19 1418

## 2019-08-11 NOTE — ED Notes (Signed)
Pt transported to CT ?

## 2019-08-11 NOTE — ED Notes (Signed)
Pt refused toradol stating medication will do nothing for her pain. She states staff is doing nothing for her pain. PA [facility] informed.

## 2019-08-14 LAB — URINE CULTURE: Culture: >=100000 — AB

## 2019-08-15 ENCOUNTER — Telehealth: Payer: Self-pay | Admitting: *Deleted

## 2019-08-15 NOTE — Telephone Encounter (Signed)
Post ED Visit - Positive Culture Follow-up  Culture report reviewed by antimicrobial stewardship pharmacist: [place] Team []  [address], Pharm.D. []  Heide Guile, Pharm.D., BCPS AQ-ID []  Parks Neptune, Pharm.D., BCPS []  Alycia Rossetti, Pharm.D., BCPS []  [place], Pharm.D., BCPS, AAHIVP []  Legrand Como, Pharm.D., BCPS, AAHIVP []  Salome Arnt, PharmD, BCPS []  Johnnette Gourd, PharmD, BCPS []  Hughes Better, PharmD, BCPS []  Leeroy Cha, PharmD []  Laqueta Linden, PharmD, BCPS []  Albertina Parr, PharmD Agnes Lawrence, PharmD  [place] Team []  Leodis Sias, PharmD []  Lindell Spar, PharmD []  Royetta Asal, PharmD []  Graylin Shiver, Rph []  Rema Fendt) Glennon Mac, PharmD []  Arlyn Dunning, PharmD []  Netta Cedars, PharmD []  Dia Sitter, PharmD []  Leone Haven, PharmD []  Gretta Arab, PharmD []  Theodis Shove, PharmD []  Peggyann Juba, PharmD []  Reuel Boom, PharmD   Positive urine culture Treated with Cephalexin, organism sensitive to the same and no further patient follow-up is required at this time.  Harlon Flor [facility] 08/15/2019, 9:43 AM

## 2019-09-08 ENCOUNTER — Other Ambulatory Visit: Payer: Self-pay | Admitting: Neurosurgery

## 2019-09-08 DIAGNOSIS — C719 Malignant neoplasm of brain, unspecified: Secondary | ICD-10-CM

## 2019-09-30 ENCOUNTER — Ambulatory Visit
Admission: RE | Admit: 2019-09-30 | Discharge: 2019-09-30 | Disposition: A | Discharge status: Home / Self Care | Payer: Medicare HMO | Source: Ambulatory Visit | Attending: Neurosurgery | Admitting: Neurosurgery

## 2019-09-30 ENCOUNTER — Other Ambulatory Visit: Payer: Self-pay

## 2019-09-30 DIAGNOSIS — C719 Malignant neoplasm of brain, unspecified: Secondary | ICD-10-CM

## 2019-09-30 MED ORDER — GADOBENATE DIMEGLUMINE 529 MG/ML IV SOLN
20.0000 mL | Freq: Once | INTRAVENOUS | Status: AC | PRN
  Administered 2019-09-30: 20 mL via INTRAVENOUS

## 2019-11-01 ENCOUNTER — Other Ambulatory Visit: Payer: Self-pay | Admitting: Neurology

## 2019-11-01 DIAGNOSIS — R252 Cramp and spasm: Secondary | ICD-10-CM

## 2019-11-01 DIAGNOSIS — S14104S Unspecified injury at C4 level of cervical spinal cord, sequela: Secondary | ICD-10-CM

## 2019-12-04 ENCOUNTER — Other Ambulatory Visit: Payer: Self-pay | Admitting: Neurology

## 2019-12-04 DIAGNOSIS — S14104S Unspecified injury at C4 level of cervical spinal cord, sequela: Secondary | ICD-10-CM

## 2019-12-04 DIAGNOSIS — R252 Cramp and spasm: Secondary | ICD-10-CM

## 2020-04-04 ENCOUNTER — Other Ambulatory Visit: Payer: Self-pay

## 2020-04-04 ENCOUNTER — Encounter (HOSPITAL_COMMUNITY): Payer: Self-pay | Admitting: Emergency Medicine

## 2020-04-04 ENCOUNTER — Emergency Department (HOSPITAL_COMMUNITY)
Admission: EM | Admit: 2020-04-04 | Discharge: 2020-04-04 | Disposition: A | Discharge status: Home / Self Care | Payer: Medicare HMO | Attending: Emergency Medicine | Admitting: Emergency Medicine

## 2020-04-04 DIAGNOSIS — Z5321 Procedure and treatment not carried out due to patient leaving prior to being seen by health care provider: Secondary | ICD-10-CM | POA: Insufficient documentation

## 2020-04-04 DIAGNOSIS — M7918 Myalgia, other site: Secondary | ICD-10-CM | POA: Insufficient documentation

## 2020-04-04 LAB — CBC WITH DIFFERENTIAL/PLATELET
Abs Immature Granulocytes: 0.02 10*3/uL (ref 0.00–0.07)
Basophils Absolute: 0 10*3/uL (ref 0.0–0.1)
Basophils Relative: 1 %
Eosinophils Absolute: 0.1 10*3/uL (ref 0.0–0.5)
Eosinophils Relative: 1 %
HCT: 28.9 % — ABNORMAL LOW (ref 36.0–46.0)
Hemoglobin: 8.4 g/dL — ABNORMAL LOW (ref 12.0–15.0)
Immature Granulocytes: 0 %
Lymphocytes Relative: 35 %
Lymphs Abs: 2.9 10*3/uL (ref 0.7–4.0)
MCH: 18.3 pg — ABNORMAL LOW (ref 26.0–34.0)
MCHC: 29.1 g/dL — ABNORMAL LOW (ref 30.0–36.0)
MCV: 62.8 fL — ABNORMAL LOW (ref 80.0–100.0)
Monocytes Absolute: 0.4 10*3/uL (ref 0.1–1.0)
Monocytes Relative: 5 %
Neutro Abs: 4.8 10*3/uL (ref 1.7–7.7)
Neutrophils Relative %: 58 %
Platelets: 524 10*3/uL — ABNORMAL HIGH (ref 150–400)
RBC: 4.6 MIL/uL (ref 3.87–5.11)
RDW: 21.1 % — ABNORMAL HIGH (ref 11.5–15.5)
WBC: 8.2 10*3/uL (ref 4.0–10.5)
nRBC: 0 % (ref 0.0–0.2)

## 2020-04-04 LAB — COMPREHENSIVE METABOLIC PANEL
ALT: 12 U/L (ref 0–44)
AST: 13 U/L — ABNORMAL LOW (ref 15–41)
Albumin: 3 g/dL — ABNORMAL LOW (ref 3.5–5.0)
Alkaline Phosphatase: 71 U/L (ref 38–126)
Anion gap: 10 (ref 5–15)
BUN: 11 mg/dL (ref 6–20)
CO2: 26 mmol/L (ref 22–32)
Calcium: 8.4 mg/dL — ABNORMAL LOW (ref 8.9–10.3)
Chloride: 102 mmol/L (ref 98–111)
Creatinine, Ser: 0.84 mg/dL (ref 0.44–1.00)
GFR calc Af Amer: >60 mL/min (ref >60)
GFR calc non Af Amer: >60 mL/min (ref >60)
Glucose, Bld: 132 mg/dL — ABNORMAL HIGH (ref 70–99)
Potassium: 3.3 mmol/L — ABNORMAL LOW (ref 3.5–5.1)
Sodium: 138 mmol/L (ref 135–145)
Total Bilirubin: 0.2 mg/dL — ABNORMAL LOW (ref 0.3–1.2)
Total Protein: 7.2 g/dL (ref 6.5–8.1)

## 2020-04-04 LAB — RETICULOCYTES
Immature Retic Fract: 22.9 % — ABNORMAL HIGH (ref 2.3–15.9)
RBC.: 4.63 MIL/uL (ref 3.87–5.11)
Retic Count, Absolute: 58.3 10*3/uL (ref 19.0–186.0)
Retic Ct Pct: 1.3 % (ref 0.4–3.1)

## 2020-04-04 NOTE — ED Notes (Signed)
Pt decided she did not want to be seen again. Ambulated to vehicle with assistance. Stable in condition.

## 2020-04-04 NOTE — ED Triage Notes (Signed)
Per EMS, patient from home, c/o pain all over x2 weeks. Hx sickle cell.

## 2020-04-08 ENCOUNTER — Emergency Department (HOSPITAL_COMMUNITY)
Admission: EM | Admit: 2020-04-08 | Discharge: 2020-04-08 | Disposition: A | Discharge status: Home / Self Care | Payer: Medicare HMO | Attending: Emergency Medicine | Admitting: Emergency Medicine

## 2020-04-08 ENCOUNTER — Other Ambulatory Visit: Payer: Self-pay

## 2020-04-08 ENCOUNTER — Emergency Department (HOSPITAL_COMMUNITY): Payer: Medicare HMO

## 2020-04-08 ENCOUNTER — Encounter (HOSPITAL_COMMUNITY): Payer: Self-pay

## 2020-04-08 DIAGNOSIS — E119 Type 2 diabetes mellitus without complications: Secondary | ICD-10-CM | POA: Insufficient documentation

## 2020-04-08 DIAGNOSIS — Z20822 Contact with and (suspected) exposure to covid-19: Secondary | ICD-10-CM | POA: Insufficient documentation

## 2020-04-08 DIAGNOSIS — I10 Essential (primary) hypertension: Secondary | ICD-10-CM | POA: Insufficient documentation

## 2020-04-08 DIAGNOSIS — Z87891 Personal history of nicotine dependence: Secondary | ICD-10-CM | POA: Diagnosis not present

## 2020-04-08 DIAGNOSIS — M25512 Pain in left shoulder: Secondary | ICD-10-CM | POA: Insufficient documentation

## 2020-04-08 DIAGNOSIS — Z7984 Long term (current) use of oral hypoglycemic drugs: Secondary | ICD-10-CM | POA: Diagnosis not present

## 2020-04-08 LAB — COMPREHENSIVE METABOLIC PANEL
ALT: 12 U/L (ref 0–44)
AST: 13 U/L — ABNORMAL LOW (ref 15–41)
Albumin: 3.3 g/dL — ABNORMAL LOW (ref 3.5–5.0)
Alkaline Phosphatase: 79 U/L (ref 38–126)
Anion gap: 11 (ref 5–15)
BUN: 15 mg/dL (ref 6–20)
CO2: 27 mmol/L (ref 22–32)
Calcium: 8.7 mg/dL — ABNORMAL LOW (ref 8.9–10.3)
Chloride: 96 mmol/L — ABNORMAL LOW (ref 98–111)
Creatinine, Ser: 1.14 mg/dL — ABNORMAL HIGH (ref 0.44–1.00)
GFR calc Af Amer: >60 mL/min (ref >60)
GFR calc non Af Amer: 60 mL/min — ABNORMAL LOW (ref >60)
Glucose, Bld: 183 mg/dL — ABNORMAL HIGH (ref 70–99)
Potassium: 3.2 mmol/L — ABNORMAL LOW (ref 3.5–5.1)
Sodium: 134 mmol/L — ABNORMAL LOW (ref 135–145)
Total Bilirubin: 0.6 mg/dL (ref 0.3–1.2)
Total Protein: 7.9 g/dL (ref 6.5–8.1)

## 2020-04-08 LAB — CBC WITH DIFFERENTIAL/PLATELET
Abs Immature Granulocytes: 0.04 10*3/uL (ref 0.00–0.07)
Basophils Absolute: 0 10*3/uL (ref 0.0–0.1)
Basophils Relative: 0 %
Eosinophils Absolute: 0.1 10*3/uL (ref 0.0–0.5)
Eosinophils Relative: 1 %
HCT: 29.2 % — ABNORMAL LOW (ref 36.0–46.0)
Hemoglobin: 8.6 g/dL — ABNORMAL LOW (ref 12.0–15.0)
Immature Granulocytes: 0 %
Lymphocytes Relative: 34 %
Lymphs Abs: 3.9 10*3/uL (ref 0.7–4.0)
MCH: 18.4 pg — ABNORMAL LOW (ref 26.0–34.0)
MCHC: 29.5 g/dL — ABNORMAL LOW (ref 30.0–36.0)
MCV: 62.5 fL — ABNORMAL LOW (ref 80.0–100.0)
Monocytes Absolute: 0.6 10*3/uL (ref 0.1–1.0)
Monocytes Relative: 5 %
Neutro Abs: 6.7 10*3/uL (ref 1.7–7.7)
Neutrophils Relative %: 60 %
Platelets: 483 10*3/uL — ABNORMAL HIGH (ref 150–400)
RBC: 4.67 MIL/uL (ref 3.87–5.11)
RDW: 20.7 % — ABNORMAL HIGH (ref 11.5–15.5)
WBC: 11.4 10*3/uL — ABNORMAL HIGH (ref 4.0–10.5)
nRBC: 0 % (ref 0.0–0.2)

## 2020-04-08 LAB — RETICULOCYTES
Immature Retic Fract: 32 % — ABNORMAL HIGH (ref 2.3–15.9)
RBC.: 4.73 MIL/uL (ref 3.87–5.11)
Retic Count, Absolute: 56.3 10*3/uL (ref 19.0–186.0)
Retic Ct Pct: 1.2 % (ref 0.4–3.1)

## 2020-04-08 LAB — SARS CORONAVIRUS 2 BY RT PCR (HOSPITAL ORDER, PERFORMED IN ~~LOC~~ HOSPITAL LAB): SARS Coronavirus 2: NEGATIVE

## 2020-04-08 MED ORDER — ONDANSETRON HCL 4 MG/2ML IJ SOLN
4.0000 mg | Freq: Once | INTRAMUSCULAR | Status: AC
  Administered 2020-04-08: 4 mg via INTRAVENOUS
  Filled 2020-04-08: qty 2

## 2020-04-08 MED ORDER — HYDROMORPHONE HCL 1 MG/ML IJ SOLN
1.0000 mg | Freq: Once | INTRAMUSCULAR | Status: AC
  Administered 2020-04-08: 1 mg via INTRAVENOUS
  Filled 2020-04-08: qty 1

## 2020-04-08 MED ORDER — SODIUM CHLORIDE 0.9 % IV BOLUS
1000.0000 mL | Freq: Once | INTRAVENOUS | Status: AC
  Administered 2020-04-08: 08:00:00 1000 mL via INTRAVENOUS

## 2020-04-08 MED ORDER — DIPHENHYDRAMINE HCL 25 MG PO CAPS
25.0000 mg | ORAL_CAPSULE | Freq: Once | ORAL | Status: AC
  Administered 2020-04-08: 25 mg via ORAL
  Filled 2020-04-08: qty 1

## 2020-04-08 MED ORDER — HEPARIN SOD (PORK) LOCK FLUSH 100 UNIT/ML IV SOLN: 500.0000 [IU] | Freq: Once | INTRAVENOUS | Status: DC

## 2020-04-08 MED ORDER — HYDROMORPHONE HCL 2 MG/ML IJ SOLN
2.0000 mg | Freq: Once | INTRAMUSCULAR | Status: AC
  Administered 2020-04-08: 2 mg via INTRAVENOUS
  Filled 2020-04-08: qty 1

## 2020-04-08 NOTE — ED Notes (Signed)
An After Visit Summary was printed and given to the patient. Discharge instructions given and no further questions at this time.  

## 2020-04-08 NOTE — ED Provider Notes (Signed)
[facility] DEPT Provider Note   CSN: [id number] Arrival date & time: 04/08/20  0154     History Chief Complaint  Patient presents with  . Sickle Cell Pain Crisis    Cynthia Rosales is a 39 y.o. female.  The history is provided by the patient.  Sickle Cell Pain Crisis Location:  Diffuse Severity:  Mild Onset quality:  Gradual Similar to previous crisis episodes: yes   Timing:  Constant Progression:  Unchanged Chronicity:  Recurrent Context: not change in medication and not infection   Relieved by:  Nothing Worsened by:  Nothing Ineffective treatments:  Prescription drugs and OTC medications Associated symptoms: no chest pain, no cough, no fatigue, no fever, no shortness of breath, no sore throat, no vomiting and no wheezing   Associated symptoms comment:  Left shoulder pain/shin pain after fall yesterday as well       Past Medical History:  Diagnosis Date  . Abscess of bursa, left elbow 06/2013   Treated with I and D/antibiotics.   . Anemia   . Asthma   . Depression   . History of blood transfusion    "after I had one of my kids"  . Hypertension   . Ovarian cyst   . Renal disorder    kidney stones  . Type II diabetes mellitus [facility])     Patient Active Problem List   Diagnosis Date Noted  . Bile leak, postoperative   . Choledocholithiasis   . Intra-abdominal fluid collection   . Anemia 04/28/2018  . Morbid obesity with body mass index (BMI) of 50.0 to 59.9 in adult ([place]) 04/02/2018  . Mass of uterus 04/02/2018  . Transaminitis 04/02/2018  . Acute cholecystitis s/p lap cholecystectomy 04/03/2018 04/01/2018  . Pregnancy, ectopic, cornual or cervical 07/21/2017  . 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  . Neurogenic bladder 08/23/2015  . Bilateral carpal tunnel syndrome 08/23/2015  . Paraparesis of both lower limbs ([place]) 07/14/2015  . Ileus, postoperative ([place]) 12/25/2014  . Diabetes mellitus type 2 in obese ([place]) 12/25/2014  .  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[place]) 12/24/2014  . 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 Ileus of unspecified type ([place])   . Atelectasis   . Bowel obstruction ([place])   . Encounter for central line care   . Ileus ([place])   . Intestinal occlusion ([facility])   . 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  . Headache 11/19/2014  . Neck pain 11/19/2014  . Right sided weakness 11/19/2014  . Paresthesias 11/19/2014  . Nightmares 09/11/2014  . HTN (hypertension), benign 03/28/2014  . Nephrolithiasis 03/27/2014  . Diabetes mellitus ([place]) 03/27/2014  . Abdominal pain 03/27/2014    Past Surgical History:  Procedure Laterality Date  . APPENDECTOMY  2013  . BILIARY STENT PLACEMENT N/A 05/02/2018   Procedure: BILIARY STENT PLACEMENT;  Surgeon: Doran Stabler, MD;  Location: [facility];  Service: Gastroenterology;  Laterality: N/A;  . CARPAL TUNNEL RELEASE    . CESAREAN SECTION  2000; 2007; 2011  . CHOLECYSTECTOMY N/A 04/03/2018   Procedure: LAPAROSCOPIC CHOLECYSTECTOMY;  Surgeon: Rolm Bookbinder, MD;  Location: [facility];  Service: General;  Laterality: N/A;  . DILATION AND CURETTAGE OF UTERUS    . ERCP N/A 05/02/2018   Procedure: ENDOSCOPIC RETROGRADE CHOLANGIOPANCREATOGRAPHY (ERCP);  Surgeon: Doran Stabler, MD;  Location: [facility];  Service: Gastroenterology;  Laterality: N/A;  . ERCP N/A 07/26/2018   Procedure: ENDOSCOPIC RETROGRADE CHOLANGIOPANCREATOGRAPHY (ERCP);  Surgeon: Irene Shipper, MD;  Location: [facility];  Service: Endoscopy;  Laterality: N/A;  stent removal  . IR RADIOLOGIST EVAL & MGMT  05/19/2018  . LAMINECTOMY N/A 12/13/2014   Procedure: CERVICAL LAMINECTOMY FOR INTRADURAL TUMOR;  Surgeon: Charlie Pitter, MD;  Location: [facility];  Service: Neurosurgery;  Laterality: N/A;  posterior  . LAPAROSCOPY N/A 07/20/2017   Procedure: LAPAROSCOPY OPERATIVE WITH WEDGE RESECTION RIGHT CORNUA AND PARTIAL SALPINGECTOMY;  Surgeon: [name], MD;  Location: [facility];   Service: Gynecology;  Laterality: N/A;  . REDUCTION MAMMAPLASTY Bilateral 1998  . REMOVAL OF STONES  05/02/2018   Procedure: REMOVAL OF STONES;  Surgeon: Doran Stabler, MD;  Location: [facility];  Service: Gastroenterology;;  . [name]  05/02/2018   Procedure: SPHINCTEROTOMY;  Surgeon: Doran Stabler, MD;  Location: [facility];  Service: Gastroenterology;;  . Lavell Islam REMOVAL  07/26/2018   Procedure: STENT REMOVAL;  Surgeon: Irene Shipper, MD;  Location: [facility];  Service: Endoscopy;;     OB History    Gravida  5   Para  3   Term  2   Preterm  1   AB      Living  3     SAB      TAB      Ectopic      Multiple      Live Births  3           Family History  Problem Relation Age of Onset  . Diabetes Mother   . Hypertension Mother   . Breast cancer Maternal Aunt   . Ovarian cancer Maternal Grandmother   . Breast cancer Maternal Aunt   . Migraines Neg Hx     Social History   Tobacco Use  . Smoking status: Former Smoker    Years: 0.00  . Smokeless tobacco: Never Used  . Tobacco comment: 03/27/2014 "smoked ~ 1 cigarette/day; quit in ~ 2013"  Substance Use Topics  . Alcohol use: Never    Alcohol/week: 0.0 standard drinks  . Drug use: No    Home Medications Prior to Admission medications   Medication Sig Start Date End Date Taking? Authorizing Provider  hydrochlorothiazide (HYDRODIURIL) 25 MG tablet Take 25 mg by mouth daily.  04/27/18  Yes [provider]  NOVOLOG MIX 70/30 FLEXPEN (70-30) 100 UNIT/ML FlexPen Inject 35-36 Units into the skin See admin instructions. 35 units after breakfast 36 units after dinner 02/28/20  Yes [provider]  oxycodone (ROXICODONE) 30 MG immediate release tablet Take 1 tablet (30 mg total) by mouth every 6 (six) hours as needed for pain. 05/04/18  Yes Jegede, Olugbemiga E, MD  sitaGLIPtin (JANUVIA) 100 MG tablet Take 100 mg by mouth daily.   Yes [provider]  diazepam (VALIUM) 5 MG  tablet TAKE 1 TABLET BY MOUTH TWICE DAILY AS NEEDED FOR SPASTICITY. Patient not taking: Reported on 04/08/2020 12/04/19   Melvenia Beam, MD  etodolac (LODINE) 300 MG capsule Take 1 capsule (300 mg total) by mouth 3 (three) times daily as needed. Patient not taking: Reported on 12/19/2018 08/22/18   Dorie Rank, MD  ondansetron (ZOFRAN ODT) 4 MG disintegrating tablet Take 1 tablet (4 mg total) by mouth every 8 (eight) hours as needed for nausea or vomiting. Patient not taking: Reported on 04/08/2020 08/11/19   Rodell Perna A, PA-C    Allergies    Amoxicillin, Buprenorphine hcl, and Morphine and related  Review of Systems   Review of Systems  Constitutional:  Negative for chills, fatigue and fever.  HENT: Negative for ear pain and sore throat.   Eyes: Negative for pain and visual disturbance.  Respiratory: Negative for cough, shortness of breath and wheezing.   Cardiovascular: Negative for chest pain and palpitations.  Gastrointestinal: Negative for abdominal pain and vomiting.  Genitourinary: Negative for dysuria and hematuria.  Musculoskeletal: Positive for myalgias. Negative for arthralgias and back pain.  Skin: Negative for color change and rash.  Neurological: Negative for seizures and syncope.  All other systems reviewed and are negative.   Physical Exam Updated Vital Signs  ED Triage Vitals  Enc Vitals Group     BP 04/08/20 0252 (!) 163/93     Pulse Rate 04/08/20 0252 94     Resp 04/08/20 0252 18     Temp 04/08/20 0252 98.7 F (37.1 C)     Temp Source 04/08/20 0252 Oral     SpO2 04/08/20 0252 100 %     Weight 04/08/20 0253 300 lb (136.1 kg)     Height 04/08/20 0253 5\' 4"  (1.626 m)     Head Circumference --      Peak Flow --      Pain Score 04/08/20 0253 10     Pain Loc --      Pain Edu? --      Excl. in [place]? --     Physical Exam Vitals and nursing note reviewed.  Constitutional:      General: She is not in acute distress.    Appearance: She is well-developed. She is  not ill-appearing.  HENT:     Head: Normocephalic and atraumatic.     Nose: Nose normal.     Mouth/Throat:     Mouth: Mucous membranes are moist.  Eyes:     Extraocular Movements: Extraocular movements intact.     Conjunctiva/sclera: Conjunctivae normal.     Pupils: Pupils are equal, round, and reactive to light.  Cardiovascular:     Rate and Rhythm: Normal rate and regular rhythm.     Heart sounds: No murmur.  Pulmonary:     Effort: Pulmonary effort is normal. No respiratory distress.     Breath sounds: Normal breath sounds.  Abdominal:     Palpations: Abdomen is soft.     Tenderness: There is no abdominal tenderness.  Musculoskeletal:        General: Tenderness (left shoulder/shin) present. Normal range of motion.     Cervical back: Normal range of motion and neck supple. No tenderness.     Right lower leg: No edema.     Left lower leg: No edema.  Skin:    General: Skin is warm and dry.     Capillary Refill: Capillary refill takes less than 2 seconds.  Neurological:     General: No focal deficit present.     Mental Status: She is alert.     ED Results / Procedures / Treatments   Labs (all labs ordered are listed, but only abnormal results are displayed) Labs Reviewed  COMPREHENSIVE METABOLIC PANEL - Abnormal; Notable for the following components:      Result Value   Sodium 134 (*)    Potassium 3.2 (*)    Chloride 96 (*)    Glucose, Bld 183 (*)    Creatinine, Ser 1.14 (*)    Calcium 8.7 (*)    Albumin 3.3 (*)    AST 13 (*)    GFR calc non Af Amer 60 (*)    All other  components within normal limits  CBC WITH DIFFERENTIAL/PLATELET - Abnormal; Notable for the following components:   WBC 11.4 (*)    Hemoglobin 8.6 (*)    HCT 29.2 (*)    MCV 62.5 (*)    MCH 18.4 (*)    MCHC 29.5 (*)    RDW 20.7 (*)    Platelets 483 (*)    All other components within normal limits  RETICULOCYTES - Abnormal; Notable for the following components:   Immature Retic Fract 32.0 (*)      All other components within normal limits  SARS CORONAVIRUS 2 BY RT PCR (HOSPITAL ORDER, [facility])    EKG None  Radiology DG Shoulder 1 View Left  Result Date: 04/08/2020 CLINICAL DATA:  Shoulder pain, fell, sickle cell disease EXAM: LEFT SHOULDER COMPARISON:  01/11/2018 FINDINGS: Frontal and transscapular views of the left shoulder demonstrate no fractures. Alignment is anatomic. Joint spaces are well preserved. IMPRESSION: 1. Unremarkable left shoulder. Electronically Signed   By: Randa Ngo M.D.   On: 04/08/2020 03:42   DG Tibia/Fibula Left  Result Date: 04/08/2020 CLINICAL DATA:  Sickle cell disease, fell, left lower leg pain EXAM: LEFT TIBIA AND FIBULA - 2 VIEW COMPARISON:  None. FINDINGS: Frontal and lateral views of the left tibia and fibula demonstrate no fractures. Alignment is anatomic. No acute fractures. Joint spaces of the left knee and ankle are well preserved. IMPRESSION: 1. Unremarkable left tibia and fibula. Electronically Signed   By: Randa Ngo M.D.   On: 04/08/2020 03:44    Procedures Procedures (including critical care time)  Medications Ordered in ED Medications  sodium chloride 0.9 % bolus 1,000 mL (1,000 mLs Intravenous New Bag/Given 04/08/20 0746)  HYDROmorphone (DILAUDID) injection 1 mg (1 mg Intravenous Given 04/08/20 0746)  ondansetron (ZOFRAN) injection 4 mg (4 mg Intravenous Given 04/08/20 0747)  diphenhydrAMINE (BENADRYL) capsule 25 mg (25 mg Oral Given 04/08/20 0747)  HYDROmorphone (DILAUDID) injection 1 mg (1 mg Intravenous Given 04/08/20 0909)  HYDROmorphone (DILAUDID) injection 2 mg (2 mg Intravenous Given 04/08/20 1041)    ED Course  I have reviewed the triage vital signs and the nursing notes.  Pertinent labs & imaging results that were available during my care of the patient were reviewed by me and considered in my medical decision making (see chart for details).    MDM Rules/Calculators/A&P                       Cynthia Rosales is a 39 year old female with history of sickle cell who presents to the ED with diffuse pain.  Patient with also focal pain in the left shoulder and left shin after fall yesterday.  Has had pain crisis type symptoms of the past week.  Home medications have not helped.  No infectious symptoms.  Normal vitals.  No fever.  Hemoglobin unremarkable.  Lab work overall unremarkable.  No significant anemia, electrolyte abnormality, kidney injury.  X-rays of the left tibia and left shoulder are unremarkable.  We will place the patient in a sling for comfort.  Likely contusions of both shoulder and left leg.  Will give IV fluids, IV Dilaudid, IV Benadryl, IV Zofran and reevaluate.  Patient felt better after IV pain medications.  Of note after talking with sickle cell team about possibly further pain control it appears that patient may not actually have a diagnosis of sickle cell.  She actually just appears generally confused about what her diagnosis may  be.  We will have her follow-up with her primary care doctor and discharged in ED in good condition.  Patient felt improved and discharged.  This chart was dictated using voice recognition software.  Despite best efforts to proofread,  errors can occur which can change the documentation meaning.    Final Clinical Impression(s) / ED Diagnoses Final diagnoses:  Acute pain of left shoulder    Rx / DC Orders ED Discharge Orders    None       Lennice Sites, DO 04/08/20 1158

## 2020-04-08 NOTE — Discharge Instructions (Addendum)
Follow-up with your primary care doctor to further discuss need for clarification on sickle cell disease status.  Follow-up with them as well for your shoulder pain.  Recommend Tylenol Motrin at home.

## 2020-04-08 NOTE — Progress Notes (Signed)
Orthopedic Tech Progress Note Patient Details:  Cynthia Rosales 1981-06-16 [id number]  Ortho Devices Type of Ortho Device: Sling immobilizer Ortho Device/Splint Interventions: Application   Post Interventions Patient Tolerated: Well Instructions Provided: Care of device   [name] 04/08/2020, 8:55 AM

## 2020-04-08 NOTE — ED Triage Notes (Signed)
Pt c/o sickle cell pain all over the body. Sts this is her normal pain. Fall today and left shoulder and shin pain.

## 2020-05-01 ENCOUNTER — Other Ambulatory Visit: Payer: Self-pay | Admitting: Neurology

## 2020-05-01 DIAGNOSIS — S14104S Unspecified injury at C4 level of cervical spinal cord, sequela: Secondary | ICD-10-CM

## 2020-05-01 DIAGNOSIS — R252 Cramp and spasm: Secondary | ICD-10-CM

## 2020-06-01 ENCOUNTER — Encounter (HOSPITAL_COMMUNITY): Payer: Self-pay | Admitting: Emergency Medicine

## 2020-06-01 ENCOUNTER — Other Ambulatory Visit: Payer: Self-pay

## 2020-06-01 ENCOUNTER — Emergency Department (HOSPITAL_COMMUNITY)
Admission: EM | Admit: 2020-06-01 | Discharge: 2020-06-01 | Disposition: A | Discharge status: Home / Self Care | Payer: Medicare HMO | Attending: Emergency Medicine | Admitting: Emergency Medicine

## 2020-06-01 ENCOUNTER — Other Ambulatory Visit: Payer: Self-pay | Admitting: Family Medicine

## 2020-06-01 DIAGNOSIS — M791 Myalgia, unspecified site: Secondary | ICD-10-CM | POA: Diagnosis not present

## 2020-06-01 DIAGNOSIS — Z955 Presence of coronary angioplasty implant and graft: Secondary | ICD-10-CM | POA: Insufficient documentation

## 2020-06-01 DIAGNOSIS — Z87891 Personal history of nicotine dependence: Secondary | ICD-10-CM | POA: Diagnosis not present

## 2020-06-01 DIAGNOSIS — E1140 Type 2 diabetes mellitus with diabetic neuropathy, unspecified: Secondary | ICD-10-CM | POA: Diagnosis not present

## 2020-06-01 DIAGNOSIS — M2550 Pain in unspecified joint: Secondary | ICD-10-CM | POA: Insufficient documentation

## 2020-06-01 DIAGNOSIS — G8929 Other chronic pain: Secondary | ICD-10-CM

## 2020-06-01 DIAGNOSIS — E119 Type 2 diabetes mellitus without complications: Secondary | ICD-10-CM | POA: Insufficient documentation

## 2020-06-01 DIAGNOSIS — Z7984 Long term (current) use of oral hypoglycemic drugs: Secondary | ICD-10-CM | POA: Insufficient documentation

## 2020-06-01 DIAGNOSIS — M545 Low back pain: Secondary | ICD-10-CM | POA: Diagnosis not present

## 2020-06-01 DIAGNOSIS — Z760 Encounter for issue of repeat prescription: Secondary | ICD-10-CM

## 2020-06-01 DIAGNOSIS — D571 Sickle-cell disease without crisis: Secondary | ICD-10-CM | POA: Diagnosis not present

## 2020-06-01 DIAGNOSIS — I10 Essential (primary) hypertension: Secondary | ICD-10-CM | POA: Insufficient documentation

## 2020-06-01 DIAGNOSIS — D5700 Hb-SS disease with crisis, unspecified: Secondary | ICD-10-CM

## 2020-06-01 DIAGNOSIS — J45909 Unspecified asthma, uncomplicated: Secondary | ICD-10-CM | POA: Diagnosis not present

## 2020-06-01 MED ORDER — HYDROMORPHONE HCL 2 MG/ML IJ SOLN
2.0000 mg | Freq: Once | INTRAMUSCULAR | Status: AC
  Administered 2020-06-01: 2 mg via SUBCUTANEOUS
  Filled 2020-06-01: qty 1

## 2020-06-01 MED ORDER — OXYCODONE HCL 30 MG PO TABS: 30.0000 mg | ORAL_TABLET | Freq: Four times a day (QID) | ORAL | 0 refills | Status: DC | PRN

## 2020-06-01 MED ORDER — OXYCODONE HCL 5 MG PO TABS
15.0000 mg | ORAL_TABLET | Freq: Once | ORAL | Status: AC
  Administered 2020-06-01: 15 mg via ORAL
  Filled 2020-06-01: qty 3

## 2020-06-01 NOTE — Discharge Instructions (Addendum)
Please ensure you take the medications that were prescribed today. We hope that the pain gets better with scheduled medications.  Return to the ER immediately if your symptoms get worse.

## 2020-06-01 NOTE — Progress Notes (Signed)
  Patient Cynthia Rosales and Sickle Cell Care     Cynthia Rosales is a 39 year old female with a medical history significant for sickle cell disease, chronic pain syndrome, opiate dependence and tolerance is a patient of Dr. Julious Oka. Patient ran out of prescription medications 2 days prior and is now experiencing sickle cell pain crisis. Patient has generalized pain rated 10/10. She is very tearful. Patient advised to continue treatment plan per ER physician. Her medication will be sent to the pharmacy. Reviewed PDMP substance reporting system prior to prescribing opiate medications. No inconsistencies noted.   Meds ordered this encounter  Medications  . oxycodone (ROXICODONE) 30 MG immediate release tablet    Sig: Take 1 tablet (30 mg total) by mouth every 6 (six) hours as needed for pain.    Dispense:  60 tablet    Refill:  0    Order Specific Question:   Supervising Provider    Answer:   Tresa Garter [[id number]]   Donia Pounds  APRN, MSN, FNP-C [facility] [address] [place], [place] [address] [phone]

## 2020-06-01 NOTE — ED Notes (Signed)
Patient calling friend for transport. Call bell in reach to call for assistance to wheelchair when ride arrives.

## 2020-06-01 NOTE — ED Notes (Signed)
[place] NP at bedside.

## 2020-06-01 NOTE — ED Triage Notes (Signed)
Patient here from home reporting that she needs med refill of oxycodone 30mg . States that she has a hx of sickle cell disease and rarely has crisis. States that she talked to Dr Jonelle Sidle and he is "out of town".

## 2020-06-01 NOTE — Progress Notes (Signed)
Daleah Coulson is a 39 year old female with a medical history significant for sickle cell disease, chronic pain syndrome, opiate dependence and tolerance presented to the emergency department complaining of increased pain.  Pain is characterized as generalized, constant, and throbbing.  Patient states that she ran out of prescription medications 2 days prior to presenting.  She attempted to contact her PCPs office.  Office is closed and PCP is out of the country.  Notified by ER physician that patient is in need of home medications.  Reviewed PDMP, patient last filled prescription on 05/01/2020 for a total of 150 pills / 30 days.  Patient will continue treatment plan in ER.  Will send prescription to pharmacy for a total of 10 days.  Donia Pounds  APRN, MSN, FNP-C [facility] [address] [place], [place] [address] [phone]

## 2020-06-01 NOTE — ED Notes (Signed)
Patient assisted to car with wheelchair.

## 2020-06-02 DIAGNOSIS — I10 Essential (primary) hypertension: Secondary | ICD-10-CM | POA: Diagnosis not present

## 2020-06-02 DIAGNOSIS — E119 Type 2 diabetes mellitus without complications: Secondary | ICD-10-CM | POA: Diagnosis not present

## 2020-06-02 DIAGNOSIS — M545 Low back pain: Secondary | ICD-10-CM | POA: Diagnosis not present

## 2020-06-02 DIAGNOSIS — E1140 Type 2 diabetes mellitus with diabetic neuropathy, unspecified: Secondary | ICD-10-CM | POA: Diagnosis not present

## 2020-06-02 NOTE — ED Provider Notes (Signed)
[facility] DEPT Provider Note   CSN: [id number] Arrival date & time: 06/01/20  1213     History Chief Complaint  Patient presents with  . Sickle Cell Pain Crisis  . Medication Refill    Cynthia Rosales is a 39 y.o. female.  HPI    39 year old female comes in a chief complaint of sickle cell pain.  Patient has history of sickle cell anemia and reports that she has undergone some injuries recently.  She was supposed to get her medications refilled, but when she went to the pharmacy she was informed that there were no refills available.  Unfortunately [place] and unable to prescribe the medications.  The nurse practitioner had discussed the case with sickle cell team here and patient was advised to come to the ER for bridging purposes.  Patient reports that she is always in pain and takes 30 mg of oxycodone to 4 hours.  She has additional pain medications as well.  Last dose was more than 24 hours ago.  Past Medical History:  Diagnosis Date  . Abscess of bursa, left elbow 06/2013   Treated with I and D/antibiotics.   . Anemia   . Asthma   . Depression   . History of blood transfusion    "after I had one of my kids"  . Hypertension   . Ovarian cyst   . Renal disorder    kidney stones  . Type II diabetes mellitus [facility])     Patient Active Problem List   Diagnosis Date Noted  . Bile leak, postoperative   . Choledocholithiasis   . Intra-abdominal fluid collection   . Anemia 04/28/2018  . Morbid obesity with body mass index (BMI) of 50.0 to 59.9 in adult ([place]) 04/02/2018  . Mass of uterus 04/02/2018  . Transaminitis 04/02/2018  . Acute cholecystitis s/p lap cholecystectomy 04/03/2018 04/01/2018  . Pregnancy, ectopic, cornual or cervical 07/21/2017  . 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  . Neurogenic bladder 08/23/2015  . Bilateral carpal tunnel syndrome 08/23/2015  . Paraparesis of both lower limbs ([place]) 07/14/2015  . Ileus,  postoperative ([place]) 12/25/2014  . Diabetes mellitus type 2 in obese ([place]) 12/25/2014  . 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[place]) 12/24/2014  . 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 Ileus of unspecified type ([place])   . Atelectasis   . Bowel obstruction ([place])   . Encounter for central line care   . Ileus ([place])   . Intestinal occlusion ([facility])   . 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  . Headache 11/19/2014  . Neck pain 11/19/2014  . Right sided weakness 11/19/2014  . Paresthesias 11/19/2014  . Nightmares 09/11/2014  . HTN (hypertension), benign 03/28/2014  . Nephrolithiasis 03/27/2014  . Diabetes mellitus ([place]) 03/27/2014  . Abdominal pain 03/27/2014    Past Surgical History:  Procedure Laterality Date  . APPENDECTOMY  2013  . BILIARY STENT PLACEMENT N/A 05/02/2018   Procedure: BILIARY STENT PLACEMENT;  Surgeon: Doran Stabler, MD;  [facility];  Service: Gastroenterology;  Laterality: N/A;  . CARPAL TUNNEL RELEASE    . CESAREAN SECTION  2000; 2007; 2011  . CHOLECYSTECTOMY N/A 04/03/2018   Procedure: LAPAROSCOPIC CHOLECYSTECTOMY;  Surgeon: Rolm Bookbinder, MD;  Location: [facility];  Service: General;  Laterality: N/A;  . DILATION AND CURETTAGE OF UTERUS    . ERCP N/A 05/02/2018   Procedure: ENDOSCOPIC RETROGRADE CHOLANGIOPANCREATOGRAPHY (ERCP);  Surgeon: Doran Stabler, MD;  [facility];  Service: Gastroenterology;  Laterality: N/A;  . ERCP N/A 07/26/2018   Procedure: ENDOSCOPIC RETROGRADE CHOLANGIOPANCREATOGRAPHY (ERCP);  Surgeon: Irene Shipper, MD;  Location[facility];  Service: Endoscopy;  Laterality: N/A;  stent removal  . IR RADIOLOGIST EVAL & MGMT  05/19/2018  . LAMINECTOMY N/A 12/13/2014   Procedure: CERVICAL LAMINECTOMY FOR INTRADURAL TUMOR;  Surgeon: Charlie Pitter, MD;  Location: [facility];  Service: Neurosurgery;  Laterality: N/A;  posterior  . LAPAROSCOPY N/A 07/20/2017   Procedure: LAPAROSCOPY OPERATIVE WITH WEDGE RESECTION  RIGHT CORNUA AND PARTIAL SALPINGECTOMY;  Surgeon: [name], MD;  Location: [facility];  Service: Gynecology;  Laterality: N/A;  . REDUCTION MAMMAPLASTY Bilateral 1998  . REMOVAL OF STONES  05/02/2018   Procedure: REMOVAL OF STONES;  Surgeon: Doran Stabler, MD;  [facility];  Service: Gastroenterology;;  . [name]  05/02/2018   Procedure: SPHINCTEROTOMY;  Surgeon: Doran Stabler, MD;  [facility];  Service: Gastroenterology;;  . Lavell Islam REMOVAL  07/26/2018   Procedure: STENT REMOVAL;  Surgeon: Irene Shipper, MD;  [facility];  Service: Endoscopy;;     OB History    Gravida  5   Para  3   Term  2   Preterm  1   AB      Living  3     SAB      TAB      Ectopic      Multiple      Live Births  3           Family History  Problem Relation Age of Onset  . Diabetes Mother   . Hypertension Mother   . Breast cancer Maternal Aunt   . Ovarian cancer Maternal Grandmother   . Breast cancer Maternal Aunt   . Migraines Neg Hx     Social History   Tobacco Use  . Smoking status: Former Smoker    Years: 0.00  . Smokeless tobacco: Never Used  . Tobacco comment: 03/27/2014 "smoked ~ 1 cigarette/day; quit in ~ 2013"  Vaping Use  . Vaping Use: Never used  Substance Use Topics  . Alcohol use: Never    Alcohol/week: 0.0 standard drinks  . Drug use: No    Home Medications Prior to Admission medications   Medication Sig Start Date End Date Taking? Authorizing Provider  diazepam (VALIUM) 5 MG tablet TAKE 1 TABLET BY MOUTH TWICE DAILY AS NEEDED FOR SPASTICITY. Patient not taking: Reported on 04/08/2020 12/04/19   Melvenia Beam, MD  etodolac (LODINE) 300 MG capsule Take 1 capsule (300 mg total) by mouth 3 (three) times daily as needed. Patient not taking: Reported on 12/19/2018 08/22/18   Dorie Rank, MD  hydrochlorothiazide (HYDRODIURIL) 25 MG tablet Take 25 mg by mouth daily.  04/27/18   [provider]  NOVOLOG MIX 70/30  FLEXPEN (70-30) 100 UNIT/ML FlexPen Inject 35-36 Units into the skin See admin instructions. 35 units after breakfast 36 units after dinner 02/28/20   [provider]  ondansetron (ZOFRAN ODT) 4 MG disintegrating tablet Take 1 tablet (4 mg total) by mouth every 8 (eight) hours as needed for nausea or vomiting. Patient not taking: Reported on 04/08/2020 08/11/19   Rodell Perna A, PA-C  oxycodone (ROXICODONE) 30 MG immediate release tablet Take 1 tablet (30 mg total) by mouth every 6 (six) hours as needed for pain. 06/01/20   Dorena Dew, FNP  sitaGLIPtin (JANUVIA) 100 MG tablet  Take 100 mg by mouth daily.    [provider]    Allergies    Amoxicillin, Buprenorphine hcl, and Morphine and related  Review of Systems   Review of Systems  Constitutional: Positive for activity change.  Musculoskeletal: Positive for arthralgias and myalgias.    Physical Exam Updated Vital Signs BP (!) 142/105   Pulse (!) 112   Temp 99.1 F (37.3 C) (Oral)   Resp 19   Ht 5\' 4"  (1.626 m)   Wt (!) 140.6 kg   SpO2 100%   BMI 53.21 kg/m   Physical Exam Vitals and nursing note reviewed.  Constitutional:      Appearance: She is well-developed.  HENT:     Head: Normocephalic and atraumatic.  Cardiovascular:     Rate and Rhythm: Normal rate.  Pulmonary:     Effort: Pulmonary effort is normal.  Abdominal:     General: Bowel sounds are normal.  Musculoskeletal:     Cervical back: Normal range of motion and neck supple.  Skin:    General: Skin is warm and dry.  Neurological:     Mental Status: She is alert and oriented to person, place, and time.     ED Results / Procedures / Treatments   Labs (all labs ordered are listed, but only abnormal results are displayed) Labs Reviewed - No data to display  EKG None  Radiology No results found.  Procedures Procedures (including critical care time)  Medications Ordered in ED Medications  HYDROmorphone (DILAUDID) injection 2 mg  (2 mg Subcutaneous Given 06/01/20 1356)  HYDROmorphone (DILAUDID) injection 2 mg (2 mg Subcutaneous Given 06/01/20 1554)  oxyCODONE (Oxy IR/ROXICODONE) immediate release tablet 15 mg (15 mg Oral Given 06/01/20 1553)    ED Course  I have reviewed the triage vital signs and the nursing notes.  Pertinent labs & imaging results that were available during my care of the patient were reviewed by me and considered in my medical decision making (see chart for details).    MDM Rules/Calculators/A&P                          39 year old comes in w/ chief complaint of worsening chronic pain.  I discussed case with sickle cell team and they have assessed the patient and sent for her prescription for bridging purposes.  Strict ER return precautions have been discussed, and patient is agreeing with the plan and is comfortable with the workup done and the recommendations from the ER.   Final Clinical Impression(s) / ED Diagnoses Final diagnoses:  Encounter for medication refill  Sickle cell anemia with pain ([place])  Other chronic pain    Rx / DC Orders ED Discharge Orders    None       Varney Biles, MD 06/02/20 1828

## 2020-06-03 DIAGNOSIS — E119 Type 2 diabetes mellitus without complications: Secondary | ICD-10-CM | POA: Diagnosis not present

## 2020-06-03 DIAGNOSIS — E1140 Type 2 diabetes mellitus with diabetic neuropathy, unspecified: Secondary | ICD-10-CM | POA: Diagnosis not present

## 2020-06-03 DIAGNOSIS — M545 Low back pain: Secondary | ICD-10-CM | POA: Diagnosis not present

## 2020-06-03 DIAGNOSIS — I10 Essential (primary) hypertension: Secondary | ICD-10-CM | POA: Diagnosis not present

## 2020-06-04 DIAGNOSIS — I10 Essential (primary) hypertension: Secondary | ICD-10-CM | POA: Diagnosis not present

## 2020-06-04 DIAGNOSIS — M545 Low back pain: Secondary | ICD-10-CM | POA: Diagnosis not present

## 2020-06-04 DIAGNOSIS — E119 Type 2 diabetes mellitus without complications: Secondary | ICD-10-CM | POA: Diagnosis not present

## 2020-06-04 DIAGNOSIS — E1140 Type 2 diabetes mellitus with diabetic neuropathy, unspecified: Secondary | ICD-10-CM | POA: Diagnosis not present

## 2020-06-05 DIAGNOSIS — I10 Essential (primary) hypertension: Secondary | ICD-10-CM | POA: Diagnosis not present

## 2020-06-05 DIAGNOSIS — M545 Low back pain: Secondary | ICD-10-CM | POA: Diagnosis not present

## 2020-06-05 DIAGNOSIS — M25512 Pain in left shoulder: Secondary | ICD-10-CM | POA: Diagnosis not present

## 2020-06-05 DIAGNOSIS — E1140 Type 2 diabetes mellitus with diabetic neuropathy, unspecified: Secondary | ICD-10-CM | POA: Diagnosis not present

## 2020-06-05 DIAGNOSIS — E119 Type 2 diabetes mellitus without complications: Secondary | ICD-10-CM | POA: Diagnosis not present

## 2020-06-06 ENCOUNTER — Other Ambulatory Visit: Payer: Self-pay | Admitting: Neurology

## 2020-06-06 DIAGNOSIS — R252 Cramp and spasm: Secondary | ICD-10-CM

## 2020-06-06 DIAGNOSIS — S14104S Unspecified injury at C4 level of cervical spinal cord, sequela: Secondary | ICD-10-CM

## 2020-06-06 DIAGNOSIS — E119 Type 2 diabetes mellitus without complications: Secondary | ICD-10-CM | POA: Diagnosis not present

## 2020-06-06 DIAGNOSIS — M545 Low back pain: Secondary | ICD-10-CM | POA: Diagnosis not present

## 2020-06-06 DIAGNOSIS — E1140 Type 2 diabetes mellitus with diabetic neuropathy, unspecified: Secondary | ICD-10-CM | POA: Diagnosis not present

## 2020-06-06 DIAGNOSIS — I10 Essential (primary) hypertension: Secondary | ICD-10-CM | POA: Diagnosis not present

## 2020-06-07 DIAGNOSIS — E119 Type 2 diabetes mellitus without complications: Secondary | ICD-10-CM | POA: Diagnosis not present

## 2020-06-07 DIAGNOSIS — R159 Full incontinence of feces: Secondary | ICD-10-CM | POA: Diagnosis not present

## 2020-06-07 DIAGNOSIS — R32 Unspecified urinary incontinence: Secondary | ICD-10-CM | POA: Diagnosis not present

## 2020-06-07 DIAGNOSIS — E1140 Type 2 diabetes mellitus with diabetic neuropathy, unspecified: Secondary | ICD-10-CM | POA: Diagnosis not present

## 2020-06-07 DIAGNOSIS — M545 Low back pain: Secondary | ICD-10-CM | POA: Diagnosis not present

## 2020-06-07 DIAGNOSIS — I10 Essential (primary) hypertension: Secondary | ICD-10-CM | POA: Diagnosis not present

## 2020-06-08 DIAGNOSIS — E119 Type 2 diabetes mellitus without complications: Secondary | ICD-10-CM | POA: Diagnosis not present

## 2020-06-08 DIAGNOSIS — I10 Essential (primary) hypertension: Secondary | ICD-10-CM | POA: Diagnosis not present

## 2020-06-08 DIAGNOSIS — M545 Low back pain: Secondary | ICD-10-CM | POA: Diagnosis not present

## 2020-06-08 DIAGNOSIS — E1140 Type 2 diabetes mellitus with diabetic neuropathy, unspecified: Secondary | ICD-10-CM | POA: Diagnosis not present

## 2020-06-09 DIAGNOSIS — E1140 Type 2 diabetes mellitus with diabetic neuropathy, unspecified: Secondary | ICD-10-CM | POA: Diagnosis not present

## 2020-06-09 DIAGNOSIS — I10 Essential (primary) hypertension: Secondary | ICD-10-CM | POA: Diagnosis not present

## 2020-06-09 DIAGNOSIS — E119 Type 2 diabetes mellitus without complications: Secondary | ICD-10-CM | POA: Diagnosis not present

## 2020-06-09 DIAGNOSIS — M545 Low back pain: Secondary | ICD-10-CM | POA: Diagnosis not present

## 2020-06-10 DIAGNOSIS — M545 Low back pain: Secondary | ICD-10-CM | POA: Diagnosis not present

## 2020-06-10 DIAGNOSIS — E119 Type 2 diabetes mellitus without complications: Secondary | ICD-10-CM | POA: Diagnosis not present

## 2020-06-10 DIAGNOSIS — E1140 Type 2 diabetes mellitus with diabetic neuropathy, unspecified: Secondary | ICD-10-CM | POA: Diagnosis not present

## 2020-06-10 DIAGNOSIS — I10 Essential (primary) hypertension: Secondary | ICD-10-CM | POA: Diagnosis not present

## 2020-06-11 DIAGNOSIS — E119 Type 2 diabetes mellitus without complications: Secondary | ICD-10-CM | POA: Diagnosis not present

## 2020-06-11 DIAGNOSIS — M545 Low back pain: Secondary | ICD-10-CM | POA: Diagnosis not present

## 2020-06-11 DIAGNOSIS — I10 Essential (primary) hypertension: Secondary | ICD-10-CM | POA: Diagnosis not present

## 2020-06-11 DIAGNOSIS — E1140 Type 2 diabetes mellitus with diabetic neuropathy, unspecified: Secondary | ICD-10-CM | POA: Diagnosis not present

## 2020-06-12 DIAGNOSIS — I10 Essential (primary) hypertension: Secondary | ICD-10-CM | POA: Diagnosis not present

## 2020-06-12 DIAGNOSIS — E1140 Type 2 diabetes mellitus with diabetic neuropathy, unspecified: Secondary | ICD-10-CM | POA: Diagnosis not present

## 2020-06-12 DIAGNOSIS — M545 Low back pain: Secondary | ICD-10-CM | POA: Diagnosis not present

## 2020-06-12 DIAGNOSIS — E119 Type 2 diabetes mellitus without complications: Secondary | ICD-10-CM | POA: Diagnosis not present

## 2020-06-13 DIAGNOSIS — I10 Essential (primary) hypertension: Secondary | ICD-10-CM | POA: Diagnosis not present

## 2020-06-13 DIAGNOSIS — E1140 Type 2 diabetes mellitus with diabetic neuropathy, unspecified: Secondary | ICD-10-CM | POA: Diagnosis not present

## 2020-06-13 DIAGNOSIS — E119 Type 2 diabetes mellitus without complications: Secondary | ICD-10-CM | POA: Diagnosis not present

## 2020-06-13 DIAGNOSIS — M545 Low back pain: Secondary | ICD-10-CM | POA: Diagnosis not present

## 2020-06-14 DIAGNOSIS — E1140 Type 2 diabetes mellitus with diabetic neuropathy, unspecified: Secondary | ICD-10-CM | POA: Diagnosis not present

## 2020-06-14 DIAGNOSIS — M545 Low back pain: Secondary | ICD-10-CM | POA: Diagnosis not present

## 2020-06-14 DIAGNOSIS — I10 Essential (primary) hypertension: Secondary | ICD-10-CM | POA: Diagnosis not present

## 2020-06-14 DIAGNOSIS — E119 Type 2 diabetes mellitus without complications: Secondary | ICD-10-CM | POA: Diagnosis not present

## 2020-06-15 DIAGNOSIS — M545 Low back pain: Secondary | ICD-10-CM | POA: Diagnosis not present

## 2020-06-15 DIAGNOSIS — E1140 Type 2 diabetes mellitus with diabetic neuropathy, unspecified: Secondary | ICD-10-CM | POA: Diagnosis not present

## 2020-06-15 DIAGNOSIS — I10 Essential (primary) hypertension: Secondary | ICD-10-CM | POA: Diagnosis not present

## 2020-06-15 DIAGNOSIS — E119 Type 2 diabetes mellitus without complications: Secondary | ICD-10-CM | POA: Diagnosis not present

## 2020-06-16 DIAGNOSIS — E1140 Type 2 diabetes mellitus with diabetic neuropathy, unspecified: Secondary | ICD-10-CM | POA: Diagnosis not present

## 2020-06-16 DIAGNOSIS — E119 Type 2 diabetes mellitus without complications: Secondary | ICD-10-CM | POA: Diagnosis not present

## 2020-06-16 DIAGNOSIS — I10 Essential (primary) hypertension: Secondary | ICD-10-CM | POA: Diagnosis not present

## 2020-06-16 DIAGNOSIS — M545 Low back pain: Secondary | ICD-10-CM | POA: Diagnosis not present

## 2020-06-17 DIAGNOSIS — E1140 Type 2 diabetes mellitus with diabetic neuropathy, unspecified: Secondary | ICD-10-CM | POA: Diagnosis not present

## 2020-06-17 DIAGNOSIS — M545 Low back pain: Secondary | ICD-10-CM | POA: Diagnosis not present

## 2020-06-17 DIAGNOSIS — I10 Essential (primary) hypertension: Secondary | ICD-10-CM | POA: Diagnosis not present

## 2020-06-17 DIAGNOSIS — E119 Type 2 diabetes mellitus without complications: Secondary | ICD-10-CM | POA: Diagnosis not present

## 2020-06-17 DIAGNOSIS — M25512 Pain in left shoulder: Secondary | ICD-10-CM | POA: Diagnosis not present

## 2020-06-18 DIAGNOSIS — E1140 Type 2 diabetes mellitus with diabetic neuropathy, unspecified: Secondary | ICD-10-CM | POA: Diagnosis not present

## 2020-06-18 DIAGNOSIS — M545 Low back pain: Secondary | ICD-10-CM | POA: Diagnosis not present

## 2020-06-18 DIAGNOSIS — I10 Essential (primary) hypertension: Secondary | ICD-10-CM | POA: Diagnosis not present

## 2020-06-18 DIAGNOSIS — E119 Type 2 diabetes mellitus without complications: Secondary | ICD-10-CM | POA: Diagnosis not present

## 2020-06-19 DIAGNOSIS — E119 Type 2 diabetes mellitus without complications: Secondary | ICD-10-CM | POA: Diagnosis not present

## 2020-06-19 DIAGNOSIS — I10 Essential (primary) hypertension: Secondary | ICD-10-CM | POA: Diagnosis not present

## 2020-06-19 DIAGNOSIS — M545 Low back pain: Secondary | ICD-10-CM | POA: Diagnosis not present

## 2020-06-19 DIAGNOSIS — E1140 Type 2 diabetes mellitus with diabetic neuropathy, unspecified: Secondary | ICD-10-CM | POA: Diagnosis not present

## 2020-06-20 DIAGNOSIS — M545 Low back pain: Secondary | ICD-10-CM | POA: Diagnosis not present

## 2020-06-20 DIAGNOSIS — I10 Essential (primary) hypertension: Secondary | ICD-10-CM | POA: Diagnosis not present

## 2020-06-20 DIAGNOSIS — E119 Type 2 diabetes mellitus without complications: Secondary | ICD-10-CM | POA: Diagnosis not present

## 2020-06-20 DIAGNOSIS — E1140 Type 2 diabetes mellitus with diabetic neuropathy, unspecified: Secondary | ICD-10-CM | POA: Diagnosis not present

## 2020-06-21 DIAGNOSIS — E1140 Type 2 diabetes mellitus with diabetic neuropathy, unspecified: Secondary | ICD-10-CM | POA: Diagnosis not present

## 2020-06-21 DIAGNOSIS — E119 Type 2 diabetes mellitus without complications: Secondary | ICD-10-CM | POA: Diagnosis not present

## 2020-06-21 DIAGNOSIS — I10 Essential (primary) hypertension: Secondary | ICD-10-CM | POA: Diagnosis not present

## 2020-06-21 DIAGNOSIS — M545 Low back pain: Secondary | ICD-10-CM | POA: Diagnosis not present

## 2020-06-26 DIAGNOSIS — G894 Chronic pain syndrome: Secondary | ICD-10-CM | POA: Diagnosis not present

## 2020-06-26 DIAGNOSIS — M25512 Pain in left shoulder: Secondary | ICD-10-CM | POA: Diagnosis not present

## 2020-06-26 DIAGNOSIS — M7502 Adhesive capsulitis of left shoulder: Secondary | ICD-10-CM | POA: Diagnosis not present

## 2020-07-03 DIAGNOSIS — Z794 Long term (current) use of insulin: Secondary | ICD-10-CM | POA: Diagnosis not present

## 2020-07-03 DIAGNOSIS — Z6841 Body Mass Index (BMI) 40.0 and over, adult: Secondary | ICD-10-CM | POA: Diagnosis not present

## 2020-07-03 DIAGNOSIS — E1142 Type 2 diabetes mellitus with diabetic polyneuropathy: Secondary | ICD-10-CM | POA: Diagnosis not present

## 2020-07-03 DIAGNOSIS — G8929 Other chronic pain: Secondary | ICD-10-CM | POA: Diagnosis not present

## 2020-07-03 DIAGNOSIS — I10 Essential (primary) hypertension: Secondary | ICD-10-CM | POA: Diagnosis not present

## 2020-07-03 DIAGNOSIS — R69 Illness, unspecified: Secondary | ICD-10-CM | POA: Diagnosis not present

## 2020-07-03 DIAGNOSIS — R32 Unspecified urinary incontinence: Secondary | ICD-10-CM | POA: Diagnosis not present

## 2020-07-03 DIAGNOSIS — K5190 Ulcerative colitis, unspecified, without complications: Secondary | ICD-10-CM | POA: Diagnosis not present

## 2020-07-03 DIAGNOSIS — E1151 Type 2 diabetes mellitus with diabetic peripheral angiopathy without gangrene: Secondary | ICD-10-CM | POA: Diagnosis not present

## 2020-07-20 DIAGNOSIS — M545 Low back pain: Secondary | ICD-10-CM | POA: Diagnosis not present

## 2020-07-20 DIAGNOSIS — E1140 Type 2 diabetes mellitus with diabetic neuropathy, unspecified: Secondary | ICD-10-CM | POA: Diagnosis not present

## 2020-07-20 DIAGNOSIS — I10 Essential (primary) hypertension: Secondary | ICD-10-CM | POA: Diagnosis not present

## 2020-07-20 DIAGNOSIS — E119 Type 2 diabetes mellitus without complications: Secondary | ICD-10-CM | POA: Diagnosis not present

## 2020-07-21 DIAGNOSIS — E1140 Type 2 diabetes mellitus with diabetic neuropathy, unspecified: Secondary | ICD-10-CM | POA: Diagnosis not present

## 2020-07-21 DIAGNOSIS — E119 Type 2 diabetes mellitus without complications: Secondary | ICD-10-CM | POA: Diagnosis not present

## 2020-07-21 DIAGNOSIS — I10 Essential (primary) hypertension: Secondary | ICD-10-CM | POA: Diagnosis not present

## 2020-07-21 DIAGNOSIS — M545 Low back pain: Secondary | ICD-10-CM | POA: Diagnosis not present

## 2020-07-22 DIAGNOSIS — M545 Low back pain: Secondary | ICD-10-CM | POA: Diagnosis not present

## 2020-07-22 DIAGNOSIS — I10 Essential (primary) hypertension: Secondary | ICD-10-CM | POA: Diagnosis not present

## 2020-07-22 DIAGNOSIS — E1140 Type 2 diabetes mellitus with diabetic neuropathy, unspecified: Secondary | ICD-10-CM | POA: Diagnosis not present

## 2020-07-22 DIAGNOSIS — E119 Type 2 diabetes mellitus without complications: Secondary | ICD-10-CM | POA: Diagnosis not present

## 2020-07-23 DIAGNOSIS — M545 Low back pain: Secondary | ICD-10-CM | POA: Diagnosis not present

## 2020-07-23 DIAGNOSIS — E1140 Type 2 diabetes mellitus with diabetic neuropathy, unspecified: Secondary | ICD-10-CM | POA: Diagnosis not present

## 2020-07-23 DIAGNOSIS — E119 Type 2 diabetes mellitus without complications: Secondary | ICD-10-CM | POA: Diagnosis not present

## 2020-07-23 DIAGNOSIS — I10 Essential (primary) hypertension: Secondary | ICD-10-CM | POA: Diagnosis not present

## 2020-07-24 DIAGNOSIS — E119 Type 2 diabetes mellitus without complications: Secondary | ICD-10-CM | POA: Diagnosis not present

## 2020-07-24 DIAGNOSIS — M545 Low back pain: Secondary | ICD-10-CM | POA: Diagnosis not present

## 2020-07-24 DIAGNOSIS — E1140 Type 2 diabetes mellitus with diabetic neuropathy, unspecified: Secondary | ICD-10-CM | POA: Diagnosis not present

## 2020-07-24 DIAGNOSIS — I10 Essential (primary) hypertension: Secondary | ICD-10-CM | POA: Diagnosis not present

## 2020-07-25 DIAGNOSIS — I10 Essential (primary) hypertension: Secondary | ICD-10-CM | POA: Diagnosis not present

## 2020-07-25 DIAGNOSIS — E1140 Type 2 diabetes mellitus with diabetic neuropathy, unspecified: Secondary | ICD-10-CM | POA: Diagnosis not present

## 2020-07-25 DIAGNOSIS — M545 Low back pain: Secondary | ICD-10-CM | POA: Diagnosis not present

## 2020-07-25 DIAGNOSIS — E119 Type 2 diabetes mellitus without complications: Secondary | ICD-10-CM | POA: Diagnosis not present

## 2020-07-26 DIAGNOSIS — E1140 Type 2 diabetes mellitus with diabetic neuropathy, unspecified: Secondary | ICD-10-CM | POA: Diagnosis not present

## 2020-07-26 DIAGNOSIS — I10 Essential (primary) hypertension: Secondary | ICD-10-CM | POA: Diagnosis not present

## 2020-07-26 DIAGNOSIS — E119 Type 2 diabetes mellitus without complications: Secondary | ICD-10-CM | POA: Diagnosis not present

## 2020-07-26 DIAGNOSIS — M545 Low back pain: Secondary | ICD-10-CM | POA: Diagnosis not present

## 2020-07-27 DIAGNOSIS — I10 Essential (primary) hypertension: Secondary | ICD-10-CM | POA: Diagnosis not present

## 2020-07-27 DIAGNOSIS — E119 Type 2 diabetes mellitus without complications: Secondary | ICD-10-CM | POA: Diagnosis not present

## 2020-07-27 DIAGNOSIS — E1140 Type 2 diabetes mellitus with diabetic neuropathy, unspecified: Secondary | ICD-10-CM | POA: Diagnosis not present

## 2020-07-27 DIAGNOSIS — M545 Low back pain: Secondary | ICD-10-CM | POA: Diagnosis not present

## 2020-07-28 DIAGNOSIS — M545 Low back pain: Secondary | ICD-10-CM | POA: Diagnosis not present

## 2020-07-28 DIAGNOSIS — E119 Type 2 diabetes mellitus without complications: Secondary | ICD-10-CM | POA: Diagnosis not present

## 2020-07-28 DIAGNOSIS — I10 Essential (primary) hypertension: Secondary | ICD-10-CM | POA: Diagnosis not present

## 2020-07-28 DIAGNOSIS — E1140 Type 2 diabetes mellitus with diabetic neuropathy, unspecified: Secondary | ICD-10-CM | POA: Diagnosis not present

## 2020-07-29 DIAGNOSIS — E119 Type 2 diabetes mellitus without complications: Secondary | ICD-10-CM | POA: Diagnosis not present

## 2020-07-29 DIAGNOSIS — E1140 Type 2 diabetes mellitus with diabetic neuropathy, unspecified: Secondary | ICD-10-CM | POA: Diagnosis not present

## 2020-07-29 DIAGNOSIS — I10 Essential (primary) hypertension: Secondary | ICD-10-CM | POA: Diagnosis not present

## 2020-07-29 DIAGNOSIS — M545 Low back pain: Secondary | ICD-10-CM | POA: Diagnosis not present

## 2020-07-30 DIAGNOSIS — I10 Essential (primary) hypertension: Secondary | ICD-10-CM | POA: Diagnosis not present

## 2020-07-30 DIAGNOSIS — M545 Low back pain: Secondary | ICD-10-CM | POA: Diagnosis not present

## 2020-07-30 DIAGNOSIS — E1140 Type 2 diabetes mellitus with diabetic neuropathy, unspecified: Secondary | ICD-10-CM | POA: Diagnosis not present

## 2020-07-30 DIAGNOSIS — E119 Type 2 diabetes mellitus without complications: Secondary | ICD-10-CM | POA: Diagnosis not present

## 2020-07-31 DIAGNOSIS — E119 Type 2 diabetes mellitus without complications: Secondary | ICD-10-CM | POA: Diagnosis not present

## 2020-07-31 DIAGNOSIS — I10 Essential (primary) hypertension: Secondary | ICD-10-CM | POA: Diagnosis not present

## 2020-07-31 DIAGNOSIS — E1140 Type 2 diabetes mellitus with diabetic neuropathy, unspecified: Secondary | ICD-10-CM | POA: Diagnosis not present

## 2020-07-31 DIAGNOSIS — M545 Low back pain: Secondary | ICD-10-CM | POA: Diagnosis not present

## 2020-08-01 DIAGNOSIS — E1140 Type 2 diabetes mellitus with diabetic neuropathy, unspecified: Secondary | ICD-10-CM | POA: Diagnosis not present

## 2020-08-01 DIAGNOSIS — I10 Essential (primary) hypertension: Secondary | ICD-10-CM | POA: Diagnosis not present

## 2020-08-01 DIAGNOSIS — E119 Type 2 diabetes mellitus without complications: Secondary | ICD-10-CM | POA: Diagnosis not present

## 2020-08-01 DIAGNOSIS — M545 Low back pain: Secondary | ICD-10-CM | POA: Diagnosis not present

## 2020-08-02 DIAGNOSIS — E1140 Type 2 diabetes mellitus with diabetic neuropathy, unspecified: Secondary | ICD-10-CM | POA: Diagnosis not present

## 2020-08-02 DIAGNOSIS — M545 Low back pain: Secondary | ICD-10-CM | POA: Diagnosis not present

## 2020-08-02 DIAGNOSIS — I10 Essential (primary) hypertension: Secondary | ICD-10-CM | POA: Diagnosis not present

## 2020-08-02 DIAGNOSIS — E119 Type 2 diabetes mellitus without complications: Secondary | ICD-10-CM | POA: Diagnosis not present

## 2020-08-03 DIAGNOSIS — E1140 Type 2 diabetes mellitus with diabetic neuropathy, unspecified: Secondary | ICD-10-CM | POA: Diagnosis not present

## 2020-08-03 DIAGNOSIS — E119 Type 2 diabetes mellitus without complications: Secondary | ICD-10-CM | POA: Diagnosis not present

## 2020-08-03 DIAGNOSIS — I10 Essential (primary) hypertension: Secondary | ICD-10-CM | POA: Diagnosis not present

## 2020-08-04 DIAGNOSIS — E119 Type 2 diabetes mellitus without complications: Secondary | ICD-10-CM | POA: Diagnosis not present

## 2020-08-04 DIAGNOSIS — E1140 Type 2 diabetes mellitus with diabetic neuropathy, unspecified: Secondary | ICD-10-CM | POA: Diagnosis not present

## 2020-08-04 DIAGNOSIS — I10 Essential (primary) hypertension: Secondary | ICD-10-CM | POA: Diagnosis not present

## 2020-08-05 DIAGNOSIS — I10 Essential (primary) hypertension: Secondary | ICD-10-CM | POA: Diagnosis not present

## 2020-08-05 DIAGNOSIS — E119 Type 2 diabetes mellitus without complications: Secondary | ICD-10-CM | POA: Diagnosis not present

## 2020-08-05 DIAGNOSIS — E1140 Type 2 diabetes mellitus with diabetic neuropathy, unspecified: Secondary | ICD-10-CM | POA: Diagnosis not present

## 2020-08-06 DIAGNOSIS — E119 Type 2 diabetes mellitus without complications: Secondary | ICD-10-CM | POA: Diagnosis not present

## 2020-08-06 DIAGNOSIS — G894 Chronic pain syndrome: Secondary | ICD-10-CM | POA: Diagnosis not present

## 2020-08-06 DIAGNOSIS — S4992XD Unspecified injury of left shoulder and upper arm, subsequent encounter: Secondary | ICD-10-CM | POA: Diagnosis not present

## 2020-08-06 DIAGNOSIS — I10 Essential (primary) hypertension: Secondary | ICD-10-CM | POA: Diagnosis not present

## 2020-08-06 DIAGNOSIS — E1140 Type 2 diabetes mellitus with diabetic neuropathy, unspecified: Secondary | ICD-10-CM | POA: Diagnosis not present

## 2020-08-06 DIAGNOSIS — R2243 Localized swelling, mass and lump, lower limb, bilateral: Secondary | ICD-10-CM | POA: Diagnosis not present

## 2020-08-07 DIAGNOSIS — E119 Type 2 diabetes mellitus without complications: Secondary | ICD-10-CM | POA: Diagnosis not present

## 2020-08-07 DIAGNOSIS — I10 Essential (primary) hypertension: Secondary | ICD-10-CM | POA: Diagnosis not present

## 2020-08-07 DIAGNOSIS — E1140 Type 2 diabetes mellitus with diabetic neuropathy, unspecified: Secondary | ICD-10-CM | POA: Diagnosis not present

## 2020-08-08 DIAGNOSIS — E119 Type 2 diabetes mellitus without complications: Secondary | ICD-10-CM | POA: Diagnosis not present

## 2020-08-08 DIAGNOSIS — E1140 Type 2 diabetes mellitus with diabetic neuropathy, unspecified: Secondary | ICD-10-CM | POA: Diagnosis not present

## 2020-08-08 DIAGNOSIS — I10 Essential (primary) hypertension: Secondary | ICD-10-CM | POA: Diagnosis not present

## 2020-08-09 DIAGNOSIS — E119 Type 2 diabetes mellitus without complications: Secondary | ICD-10-CM | POA: Diagnosis not present

## 2020-08-09 DIAGNOSIS — I10 Essential (primary) hypertension: Secondary | ICD-10-CM | POA: Diagnosis not present

## 2020-08-09 DIAGNOSIS — E1140 Type 2 diabetes mellitus with diabetic neuropathy, unspecified: Secondary | ICD-10-CM | POA: Diagnosis not present

## 2020-08-10 DIAGNOSIS — E119 Type 2 diabetes mellitus without complications: Secondary | ICD-10-CM | POA: Diagnosis not present

## 2020-08-10 DIAGNOSIS — I10 Essential (primary) hypertension: Secondary | ICD-10-CM | POA: Diagnosis not present

## 2020-08-10 DIAGNOSIS — E1140 Type 2 diabetes mellitus with diabetic neuropathy, unspecified: Secondary | ICD-10-CM | POA: Diagnosis not present

## 2020-08-11 DIAGNOSIS — E1140 Type 2 diabetes mellitus with diabetic neuropathy, unspecified: Secondary | ICD-10-CM | POA: Diagnosis not present

## 2020-08-11 DIAGNOSIS — E119 Type 2 diabetes mellitus without complications: Secondary | ICD-10-CM | POA: Diagnosis not present

## 2020-08-11 DIAGNOSIS — I10 Essential (primary) hypertension: Secondary | ICD-10-CM | POA: Diagnosis not present

## 2020-08-12 DIAGNOSIS — I10 Essential (primary) hypertension: Secondary | ICD-10-CM | POA: Diagnosis not present

## 2020-08-12 DIAGNOSIS — E1140 Type 2 diabetes mellitus with diabetic neuropathy, unspecified: Secondary | ICD-10-CM | POA: Diagnosis not present

## 2020-08-12 DIAGNOSIS — E119 Type 2 diabetes mellitus without complications: Secondary | ICD-10-CM | POA: Diagnosis not present

## 2020-08-13 DIAGNOSIS — E1140 Type 2 diabetes mellitus with diabetic neuropathy, unspecified: Secondary | ICD-10-CM | POA: Diagnosis not present

## 2020-08-13 DIAGNOSIS — E119 Type 2 diabetes mellitus without complications: Secondary | ICD-10-CM | POA: Diagnosis not present

## 2020-08-13 DIAGNOSIS — I10 Essential (primary) hypertension: Secondary | ICD-10-CM | POA: Diagnosis not present

## 2020-08-14 DIAGNOSIS — E1140 Type 2 diabetes mellitus with diabetic neuropathy, unspecified: Secondary | ICD-10-CM | POA: Diagnosis not present

## 2020-08-14 DIAGNOSIS — E119 Type 2 diabetes mellitus without complications: Secondary | ICD-10-CM | POA: Diagnosis not present

## 2020-08-14 DIAGNOSIS — I10 Essential (primary) hypertension: Secondary | ICD-10-CM | POA: Diagnosis not present

## 2020-08-15 DIAGNOSIS — I10 Essential (primary) hypertension: Secondary | ICD-10-CM | POA: Diagnosis not present

## 2020-08-15 DIAGNOSIS — E1140 Type 2 diabetes mellitus with diabetic neuropathy, unspecified: Secondary | ICD-10-CM | POA: Diagnosis not present

## 2020-08-15 DIAGNOSIS — E119 Type 2 diabetes mellitus without complications: Secondary | ICD-10-CM | POA: Diagnosis not present

## 2020-08-16 DIAGNOSIS — E119 Type 2 diabetes mellitus without complications: Secondary | ICD-10-CM | POA: Diagnosis not present

## 2020-08-16 DIAGNOSIS — E1140 Type 2 diabetes mellitus with diabetic neuropathy, unspecified: Secondary | ICD-10-CM | POA: Diagnosis not present

## 2020-08-16 DIAGNOSIS — I10 Essential (primary) hypertension: Secondary | ICD-10-CM | POA: Diagnosis not present

## 2020-08-17 DIAGNOSIS — I10 Essential (primary) hypertension: Secondary | ICD-10-CM | POA: Diagnosis not present

## 2020-08-17 DIAGNOSIS — E119 Type 2 diabetes mellitus without complications: Secondary | ICD-10-CM | POA: Diagnosis not present

## 2020-08-17 DIAGNOSIS — E1140 Type 2 diabetes mellitus with diabetic neuropathy, unspecified: Secondary | ICD-10-CM | POA: Diagnosis not present

## 2020-08-18 DIAGNOSIS — E1140 Type 2 diabetes mellitus with diabetic neuropathy, unspecified: Secondary | ICD-10-CM | POA: Diagnosis not present

## 2020-08-18 DIAGNOSIS — E119 Type 2 diabetes mellitus without complications: Secondary | ICD-10-CM | POA: Diagnosis not present

## 2020-08-18 DIAGNOSIS — I10 Essential (primary) hypertension: Secondary | ICD-10-CM | POA: Diagnosis not present

## 2020-08-19 DIAGNOSIS — I10 Essential (primary) hypertension: Secondary | ICD-10-CM | POA: Diagnosis not present

## 2020-08-19 DIAGNOSIS — E1140 Type 2 diabetes mellitus with diabetic neuropathy, unspecified: Secondary | ICD-10-CM | POA: Diagnosis not present

## 2020-08-19 DIAGNOSIS — E119 Type 2 diabetes mellitus without complications: Secondary | ICD-10-CM | POA: Diagnosis not present

## 2020-08-20 DIAGNOSIS — I10 Essential (primary) hypertension: Secondary | ICD-10-CM | POA: Diagnosis not present

## 2020-08-20 DIAGNOSIS — E119 Type 2 diabetes mellitus without complications: Secondary | ICD-10-CM | POA: Diagnosis not present

## 2020-08-20 DIAGNOSIS — E1140 Type 2 diabetes mellitus with diabetic neuropathy, unspecified: Secondary | ICD-10-CM | POA: Diagnosis not present

## 2020-08-21 DIAGNOSIS — E119 Type 2 diabetes mellitus without complications: Secondary | ICD-10-CM | POA: Diagnosis not present

## 2020-08-21 DIAGNOSIS — I10 Essential (primary) hypertension: Secondary | ICD-10-CM | POA: Diagnosis not present

## 2020-08-21 DIAGNOSIS — E1140 Type 2 diabetes mellitus with diabetic neuropathy, unspecified: Secondary | ICD-10-CM | POA: Diagnosis not present

## 2020-08-22 DIAGNOSIS — E1140 Type 2 diabetes mellitus with diabetic neuropathy, unspecified: Secondary | ICD-10-CM | POA: Diagnosis not present

## 2020-08-22 DIAGNOSIS — I10 Essential (primary) hypertension: Secondary | ICD-10-CM | POA: Diagnosis not present

## 2020-08-22 DIAGNOSIS — E119 Type 2 diabetes mellitus without complications: Secondary | ICD-10-CM | POA: Diagnosis not present

## 2020-08-23 DIAGNOSIS — E1140 Type 2 diabetes mellitus with diabetic neuropathy, unspecified: Secondary | ICD-10-CM | POA: Diagnosis not present

## 2020-08-23 DIAGNOSIS — I10 Essential (primary) hypertension: Secondary | ICD-10-CM | POA: Diagnosis not present

## 2020-08-23 DIAGNOSIS — E119 Type 2 diabetes mellitus without complications: Secondary | ICD-10-CM | POA: Diagnosis not present

## 2020-08-24 DIAGNOSIS — I10 Essential (primary) hypertension: Secondary | ICD-10-CM | POA: Diagnosis not present

## 2020-08-24 DIAGNOSIS — E1140 Type 2 diabetes mellitus with diabetic neuropathy, unspecified: Secondary | ICD-10-CM | POA: Diagnosis not present

## 2020-08-24 DIAGNOSIS — E119 Type 2 diabetes mellitus without complications: Secondary | ICD-10-CM | POA: Diagnosis not present

## 2020-08-25 DIAGNOSIS — E1140 Type 2 diabetes mellitus with diabetic neuropathy, unspecified: Secondary | ICD-10-CM | POA: Diagnosis not present

## 2020-08-25 DIAGNOSIS — E119 Type 2 diabetes mellitus without complications: Secondary | ICD-10-CM | POA: Diagnosis not present

## 2020-08-25 DIAGNOSIS — I10 Essential (primary) hypertension: Secondary | ICD-10-CM | POA: Diagnosis not present

## 2020-08-26 DIAGNOSIS — I10 Essential (primary) hypertension: Secondary | ICD-10-CM | POA: Diagnosis not present

## 2020-08-26 DIAGNOSIS — E119 Type 2 diabetes mellitus without complications: Secondary | ICD-10-CM | POA: Diagnosis not present

## 2020-08-26 DIAGNOSIS — E1140 Type 2 diabetes mellitus with diabetic neuropathy, unspecified: Secondary | ICD-10-CM | POA: Diagnosis not present

## 2020-08-27 DIAGNOSIS — E119 Type 2 diabetes mellitus without complications: Secondary | ICD-10-CM | POA: Diagnosis not present

## 2020-08-27 DIAGNOSIS — E1140 Type 2 diabetes mellitus with diabetic neuropathy, unspecified: Secondary | ICD-10-CM | POA: Diagnosis not present

## 2020-08-27 DIAGNOSIS — I10 Essential (primary) hypertension: Secondary | ICD-10-CM | POA: Diagnosis not present

## 2020-08-28 DIAGNOSIS — E1140 Type 2 diabetes mellitus with diabetic neuropathy, unspecified: Secondary | ICD-10-CM | POA: Diagnosis not present

## 2020-08-28 DIAGNOSIS — I10 Essential (primary) hypertension: Secondary | ICD-10-CM | POA: Diagnosis not present

## 2020-08-28 DIAGNOSIS — E119 Type 2 diabetes mellitus without complications: Secondary | ICD-10-CM | POA: Diagnosis not present

## 2020-08-29 DIAGNOSIS — E1140 Type 2 diabetes mellitus with diabetic neuropathy, unspecified: Secondary | ICD-10-CM | POA: Diagnosis not present

## 2020-08-29 DIAGNOSIS — I10 Essential (primary) hypertension: Secondary | ICD-10-CM | POA: Diagnosis not present

## 2020-08-29 DIAGNOSIS — E119 Type 2 diabetes mellitus without complications: Secondary | ICD-10-CM | POA: Diagnosis not present

## 2020-08-30 DIAGNOSIS — M7502 Adhesive capsulitis of left shoulder: Secondary | ICD-10-CM | POA: Diagnosis not present

## 2020-08-30 DIAGNOSIS — I10 Essential (primary) hypertension: Secondary | ICD-10-CM | POA: Diagnosis not present

## 2020-08-30 DIAGNOSIS — E1140 Type 2 diabetes mellitus with diabetic neuropathy, unspecified: Secondary | ICD-10-CM | POA: Diagnosis not present

## 2020-08-30 DIAGNOSIS — E119 Type 2 diabetes mellitus without complications: Secondary | ICD-10-CM | POA: Diagnosis not present

## 2020-08-30 DIAGNOSIS — G894 Chronic pain syndrome: Secondary | ICD-10-CM | POA: Diagnosis not present

## 2020-08-31 DIAGNOSIS — I10 Essential (primary) hypertension: Secondary | ICD-10-CM | POA: Diagnosis not present

## 2020-08-31 DIAGNOSIS — E119 Type 2 diabetes mellitus without complications: Secondary | ICD-10-CM | POA: Diagnosis not present

## 2020-08-31 DIAGNOSIS — E1140 Type 2 diabetes mellitus with diabetic neuropathy, unspecified: Secondary | ICD-10-CM | POA: Diagnosis not present

## 2020-09-01 DIAGNOSIS — I10 Essential (primary) hypertension: Secondary | ICD-10-CM | POA: Diagnosis not present

## 2020-09-01 DIAGNOSIS — E119 Type 2 diabetes mellitus without complications: Secondary | ICD-10-CM | POA: Diagnosis not present

## 2020-09-01 DIAGNOSIS — E1140 Type 2 diabetes mellitus with diabetic neuropathy, unspecified: Secondary | ICD-10-CM | POA: Diagnosis not present

## 2020-09-02 DIAGNOSIS — E1140 Type 2 diabetes mellitus with diabetic neuropathy, unspecified: Secondary | ICD-10-CM | POA: Diagnosis not present

## 2020-09-02 DIAGNOSIS — E119 Type 2 diabetes mellitus without complications: Secondary | ICD-10-CM | POA: Diagnosis not present

## 2020-09-02 DIAGNOSIS — I10 Essential (primary) hypertension: Secondary | ICD-10-CM | POA: Diagnosis not present

## 2020-09-03 DIAGNOSIS — I10 Essential (primary) hypertension: Secondary | ICD-10-CM | POA: Diagnosis not present

## 2020-09-03 DIAGNOSIS — E119 Type 2 diabetes mellitus without complications: Secondary | ICD-10-CM | POA: Diagnosis not present

## 2020-09-03 DIAGNOSIS — E1140 Type 2 diabetes mellitus with diabetic neuropathy, unspecified: Secondary | ICD-10-CM | POA: Diagnosis not present

## 2020-09-04 DIAGNOSIS — E1140 Type 2 diabetes mellitus with diabetic neuropathy, unspecified: Secondary | ICD-10-CM | POA: Diagnosis not present

## 2020-09-04 DIAGNOSIS — E119 Type 2 diabetes mellitus without complications: Secondary | ICD-10-CM | POA: Diagnosis not present

## 2020-09-04 DIAGNOSIS — I10 Essential (primary) hypertension: Secondary | ICD-10-CM | POA: Diagnosis not present

## 2020-09-05 DIAGNOSIS — I10 Essential (primary) hypertension: Secondary | ICD-10-CM | POA: Diagnosis not present

## 2020-09-05 DIAGNOSIS — E119 Type 2 diabetes mellitus without complications: Secondary | ICD-10-CM | POA: Diagnosis not present

## 2020-09-05 DIAGNOSIS — E1140 Type 2 diabetes mellitus with diabetic neuropathy, unspecified: Secondary | ICD-10-CM | POA: Diagnosis not present

## 2020-09-06 DIAGNOSIS — I10 Essential (primary) hypertension: Secondary | ICD-10-CM | POA: Diagnosis not present

## 2020-09-06 DIAGNOSIS — N939 Abnormal uterine and vaginal bleeding, unspecified: Secondary | ICD-10-CM | POA: Diagnosis not present

## 2020-09-06 DIAGNOSIS — Z539 Procedure and treatment not carried out, unspecified reason: Secondary | ICD-10-CM | POA: Diagnosis not present

## 2020-09-06 DIAGNOSIS — E1140 Type 2 diabetes mellitus with diabetic neuropathy, unspecified: Secondary | ICD-10-CM | POA: Diagnosis not present

## 2020-09-06 DIAGNOSIS — E119 Type 2 diabetes mellitus without complications: Secondary | ICD-10-CM | POA: Diagnosis not present

## 2020-09-07 DIAGNOSIS — E1140 Type 2 diabetes mellitus with diabetic neuropathy, unspecified: Secondary | ICD-10-CM | POA: Diagnosis not present

## 2020-09-07 DIAGNOSIS — E119 Type 2 diabetes mellitus without complications: Secondary | ICD-10-CM | POA: Diagnosis not present

## 2020-09-07 DIAGNOSIS — I10 Essential (primary) hypertension: Secondary | ICD-10-CM | POA: Diagnosis not present

## 2020-09-08 DIAGNOSIS — E1140 Type 2 diabetes mellitus with diabetic neuropathy, unspecified: Secondary | ICD-10-CM | POA: Diagnosis not present

## 2020-09-08 DIAGNOSIS — E119 Type 2 diabetes mellitus without complications: Secondary | ICD-10-CM | POA: Diagnosis not present

## 2020-09-08 DIAGNOSIS — I10 Essential (primary) hypertension: Secondary | ICD-10-CM | POA: Diagnosis not present

## 2020-09-09 DIAGNOSIS — E1140 Type 2 diabetes mellitus with diabetic neuropathy, unspecified: Secondary | ICD-10-CM | POA: Diagnosis not present

## 2020-09-09 DIAGNOSIS — I10 Essential (primary) hypertension: Secondary | ICD-10-CM | POA: Diagnosis not present

## 2020-09-09 DIAGNOSIS — E119 Type 2 diabetes mellitus without complications: Secondary | ICD-10-CM | POA: Diagnosis not present

## 2020-09-10 DIAGNOSIS — E1140 Type 2 diabetes mellitus with diabetic neuropathy, unspecified: Secondary | ICD-10-CM | POA: Diagnosis not present

## 2020-09-10 DIAGNOSIS — E119 Type 2 diabetes mellitus without complications: Secondary | ICD-10-CM | POA: Diagnosis not present

## 2020-09-10 DIAGNOSIS — I10 Essential (primary) hypertension: Secondary | ICD-10-CM | POA: Diagnosis not present

## 2020-09-11 DIAGNOSIS — I10 Essential (primary) hypertension: Secondary | ICD-10-CM | POA: Diagnosis not present

## 2020-09-11 DIAGNOSIS — E119 Type 2 diabetes mellitus without complications: Secondary | ICD-10-CM | POA: Diagnosis not present

## 2020-09-11 DIAGNOSIS — E1140 Type 2 diabetes mellitus with diabetic neuropathy, unspecified: Secondary | ICD-10-CM | POA: Diagnosis not present

## 2020-09-12 DIAGNOSIS — E119 Type 2 diabetes mellitus without complications: Secondary | ICD-10-CM | POA: Diagnosis not present

## 2020-09-12 DIAGNOSIS — E1140 Type 2 diabetes mellitus with diabetic neuropathy, unspecified: Secondary | ICD-10-CM | POA: Diagnosis not present

## 2020-09-12 DIAGNOSIS — I10 Essential (primary) hypertension: Secondary | ICD-10-CM | POA: Diagnosis not present

## 2020-09-13 DIAGNOSIS — E119 Type 2 diabetes mellitus without complications: Secondary | ICD-10-CM | POA: Diagnosis not present

## 2020-09-13 DIAGNOSIS — E1140 Type 2 diabetes mellitus with diabetic neuropathy, unspecified: Secondary | ICD-10-CM | POA: Diagnosis not present

## 2020-09-13 DIAGNOSIS — I10 Essential (primary) hypertension: Secondary | ICD-10-CM | POA: Diagnosis not present

## 2020-09-14 DIAGNOSIS — I10 Essential (primary) hypertension: Secondary | ICD-10-CM | POA: Diagnosis not present

## 2020-09-14 DIAGNOSIS — E119 Type 2 diabetes mellitus without complications: Secondary | ICD-10-CM | POA: Diagnosis not present

## 2020-09-14 DIAGNOSIS — E1140 Type 2 diabetes mellitus with diabetic neuropathy, unspecified: Secondary | ICD-10-CM | POA: Diagnosis not present

## 2020-09-15 DIAGNOSIS — E119 Type 2 diabetes mellitus without complications: Secondary | ICD-10-CM | POA: Diagnosis not present

## 2020-09-15 DIAGNOSIS — I10 Essential (primary) hypertension: Secondary | ICD-10-CM | POA: Diagnosis not present

## 2020-09-15 DIAGNOSIS — E1140 Type 2 diabetes mellitus with diabetic neuropathy, unspecified: Secondary | ICD-10-CM | POA: Diagnosis not present

## 2020-09-16 DIAGNOSIS — I10 Essential (primary) hypertension: Secondary | ICD-10-CM | POA: Diagnosis not present

## 2020-09-16 DIAGNOSIS — E1140 Type 2 diabetes mellitus with diabetic neuropathy, unspecified: Secondary | ICD-10-CM | POA: Diagnosis not present

## 2020-09-16 DIAGNOSIS — E119 Type 2 diabetes mellitus without complications: Secondary | ICD-10-CM | POA: Diagnosis not present

## 2020-09-17 DIAGNOSIS — E119 Type 2 diabetes mellitus without complications: Secondary | ICD-10-CM | POA: Diagnosis not present

## 2020-09-17 DIAGNOSIS — E1140 Type 2 diabetes mellitus with diabetic neuropathy, unspecified: Secondary | ICD-10-CM | POA: Diagnosis not present

## 2020-09-17 DIAGNOSIS — I10 Essential (primary) hypertension: Secondary | ICD-10-CM | POA: Diagnosis not present

## 2020-09-18 DIAGNOSIS — E1140 Type 2 diabetes mellitus with diabetic neuropathy, unspecified: Secondary | ICD-10-CM | POA: Diagnosis not present

## 2020-09-18 DIAGNOSIS — E119 Type 2 diabetes mellitus without complications: Secondary | ICD-10-CM | POA: Diagnosis not present

## 2020-09-18 DIAGNOSIS — I10 Essential (primary) hypertension: Secondary | ICD-10-CM | POA: Diagnosis not present

## 2020-09-19 DIAGNOSIS — E119 Type 2 diabetes mellitus without complications: Secondary | ICD-10-CM | POA: Diagnosis not present

## 2020-09-19 DIAGNOSIS — I10 Essential (primary) hypertension: Secondary | ICD-10-CM | POA: Diagnosis not present

## 2020-09-19 DIAGNOSIS — E1140 Type 2 diabetes mellitus with diabetic neuropathy, unspecified: Secondary | ICD-10-CM | POA: Diagnosis not present

## 2020-09-20 DIAGNOSIS — E119 Type 2 diabetes mellitus without complications: Secondary | ICD-10-CM | POA: Diagnosis not present

## 2020-09-20 DIAGNOSIS — I10 Essential (primary) hypertension: Secondary | ICD-10-CM | POA: Diagnosis not present

## 2020-09-20 DIAGNOSIS — E1140 Type 2 diabetes mellitus with diabetic neuropathy, unspecified: Secondary | ICD-10-CM | POA: Diagnosis not present

## 2020-09-21 DIAGNOSIS — E119 Type 2 diabetes mellitus without complications: Secondary | ICD-10-CM | POA: Diagnosis not present

## 2020-09-21 DIAGNOSIS — I10 Essential (primary) hypertension: Secondary | ICD-10-CM | POA: Diagnosis not present

## 2020-09-21 DIAGNOSIS — E1140 Type 2 diabetes mellitus with diabetic neuropathy, unspecified: Secondary | ICD-10-CM | POA: Diagnosis not present

## 2020-09-22 DIAGNOSIS — I10 Essential (primary) hypertension: Secondary | ICD-10-CM | POA: Diagnosis not present

## 2020-09-22 DIAGNOSIS — E119 Type 2 diabetes mellitus without complications: Secondary | ICD-10-CM | POA: Diagnosis not present

## 2020-09-22 DIAGNOSIS — E1140 Type 2 diabetes mellitus with diabetic neuropathy, unspecified: Secondary | ICD-10-CM | POA: Diagnosis not present

## 2020-09-23 DIAGNOSIS — E1140 Type 2 diabetes mellitus with diabetic neuropathy, unspecified: Secondary | ICD-10-CM | POA: Diagnosis not present

## 2020-09-23 DIAGNOSIS — E119 Type 2 diabetes mellitus without complications: Secondary | ICD-10-CM | POA: Diagnosis not present

## 2020-09-23 DIAGNOSIS — I10 Essential (primary) hypertension: Secondary | ICD-10-CM | POA: Diagnosis not present

## 2020-09-24 DIAGNOSIS — E1140 Type 2 diabetes mellitus with diabetic neuropathy, unspecified: Secondary | ICD-10-CM | POA: Diagnosis not present

## 2020-09-24 DIAGNOSIS — E119 Type 2 diabetes mellitus without complications: Secondary | ICD-10-CM | POA: Diagnosis not present

## 2020-09-24 DIAGNOSIS — I10 Essential (primary) hypertension: Secondary | ICD-10-CM | POA: Diagnosis not present

## 2020-09-25 DIAGNOSIS — I10 Essential (primary) hypertension: Secondary | ICD-10-CM | POA: Diagnosis not present

## 2020-09-25 DIAGNOSIS — N938 Other specified abnormal uterine and vaginal bleeding: Secondary | ICD-10-CM | POA: Diagnosis not present

## 2020-09-25 DIAGNOSIS — E1140 Type 2 diabetes mellitus with diabetic neuropathy, unspecified: Secondary | ICD-10-CM | POA: Diagnosis not present

## 2020-09-25 DIAGNOSIS — E119 Type 2 diabetes mellitus without complications: Secondary | ICD-10-CM | POA: Diagnosis not present

## 2020-09-25 DIAGNOSIS — D251 Intramural leiomyoma of uterus: Secondary | ICD-10-CM | POA: Diagnosis not present

## 2020-09-26 DIAGNOSIS — E1140 Type 2 diabetes mellitus with diabetic neuropathy, unspecified: Secondary | ICD-10-CM | POA: Diagnosis not present

## 2020-09-26 DIAGNOSIS — E119 Type 2 diabetes mellitus without complications: Secondary | ICD-10-CM | POA: Diagnosis not present

## 2020-09-26 DIAGNOSIS — I10 Essential (primary) hypertension: Secondary | ICD-10-CM | POA: Diagnosis not present

## 2020-09-27 DIAGNOSIS — E1140 Type 2 diabetes mellitus with diabetic neuropathy, unspecified: Secondary | ICD-10-CM | POA: Diagnosis not present

## 2020-09-27 DIAGNOSIS — I10 Essential (primary) hypertension: Secondary | ICD-10-CM | POA: Diagnosis not present

## 2020-09-27 DIAGNOSIS — E119 Type 2 diabetes mellitus without complications: Secondary | ICD-10-CM | POA: Diagnosis not present

## 2020-09-28 DIAGNOSIS — I10 Essential (primary) hypertension: Secondary | ICD-10-CM | POA: Diagnosis not present

## 2020-09-28 DIAGNOSIS — E1140 Type 2 diabetes mellitus with diabetic neuropathy, unspecified: Secondary | ICD-10-CM | POA: Diagnosis not present

## 2020-09-28 DIAGNOSIS — E119 Type 2 diabetes mellitus without complications: Secondary | ICD-10-CM | POA: Diagnosis not present

## 2020-09-29 DIAGNOSIS — E1140 Type 2 diabetes mellitus with diabetic neuropathy, unspecified: Secondary | ICD-10-CM | POA: Diagnosis not present

## 2020-09-29 DIAGNOSIS — I10 Essential (primary) hypertension: Secondary | ICD-10-CM | POA: Diagnosis not present

## 2020-09-29 DIAGNOSIS — E119 Type 2 diabetes mellitus without complications: Secondary | ICD-10-CM | POA: Diagnosis not present

## 2020-09-30 DIAGNOSIS — I10 Essential (primary) hypertension: Secondary | ICD-10-CM | POA: Diagnosis not present

## 2020-09-30 DIAGNOSIS — E1140 Type 2 diabetes mellitus with diabetic neuropathy, unspecified: Secondary | ICD-10-CM | POA: Diagnosis not present

## 2020-09-30 DIAGNOSIS — E119 Type 2 diabetes mellitus without complications: Secondary | ICD-10-CM | POA: Diagnosis not present

## 2020-10-01 ENCOUNTER — Emergency Department (HOSPITAL_COMMUNITY): Payer: Medicare HMO

## 2020-10-01 ENCOUNTER — Emergency Department (HOSPITAL_COMMUNITY)
Admission: EM | Admit: 2020-10-01 | Discharge: 2020-10-02 | Disposition: A | Discharge status: Home / Self Care | Payer: Medicare HMO | Attending: Emergency Medicine | Admitting: Emergency Medicine

## 2020-10-01 ENCOUNTER — Encounter (HOSPITAL_COMMUNITY): Payer: Self-pay

## 2020-10-01 DIAGNOSIS — I10 Essential (primary) hypertension: Secondary | ICD-10-CM | POA: Insufficient documentation

## 2020-10-01 DIAGNOSIS — Z86018 Personal history of other benign neoplasm: Secondary | ICD-10-CM | POA: Diagnosis not present

## 2020-10-01 DIAGNOSIS — R52 Pain, unspecified: Secondary | ICD-10-CM

## 2020-10-01 DIAGNOSIS — Z87891 Personal history of nicotine dependence: Secondary | ICD-10-CM | POA: Diagnosis not present

## 2020-10-01 DIAGNOSIS — E1140 Type 2 diabetes mellitus with diabetic neuropathy, unspecified: Secondary | ICD-10-CM | POA: Diagnosis not present

## 2020-10-01 DIAGNOSIS — R102 Pelvic and perineal pain: Secondary | ICD-10-CM | POA: Diagnosis not present

## 2020-10-01 DIAGNOSIS — J45909 Unspecified asthma, uncomplicated: Secondary | ICD-10-CM | POA: Insufficient documentation

## 2020-10-01 DIAGNOSIS — Z794 Long term (current) use of insulin: Secondary | ICD-10-CM | POA: Diagnosis not present

## 2020-10-01 DIAGNOSIS — Z79899 Other long term (current) drug therapy: Secondary | ICD-10-CM | POA: Insufficient documentation

## 2020-10-01 DIAGNOSIS — E119 Type 2 diabetes mellitus without complications: Secondary | ICD-10-CM | POA: Diagnosis not present

## 2020-10-01 DIAGNOSIS — D259 Leiomyoma of uterus, unspecified: Secondary | ICD-10-CM | POA: Diagnosis not present

## 2020-10-01 LAB — BASIC METABOLIC PANEL
Anion gap: 9 (ref 5–15)
BUN: 18 mg/dL (ref 6–20)
CO2: 23 mmol/L (ref 22–32)
Calcium: 8.7 mg/dL — ABNORMAL LOW (ref 8.9–10.3)
Chloride: 102 mmol/L (ref 98–111)
Creatinine, Ser: 0.91 mg/dL (ref 0.44–1.00)
GFR, Estimated: >60 mL/min (ref >60)
Glucose, Bld: 230 mg/dL — ABNORMAL HIGH (ref 70–99)
Potassium: 3.2 mmol/L — ABNORMAL LOW (ref 3.5–5.1)
Sodium: 134 mmol/L — ABNORMAL LOW (ref 135–145)

## 2020-10-01 LAB — I-STAT BETA HCG BLOOD, ED (MC, WL, AP ONLY): I-stat hCG, quantitative: <5 m[IU]/mL (ref <5)

## 2020-10-01 MED ORDER — ONDANSETRON HCL 4 MG/2ML IJ SOLN
4.0000 mg | Freq: Once | INTRAMUSCULAR | Status: AC
  Administered 2020-10-01: 4 mg via INTRAVENOUS
  Filled 2020-10-01: qty 2

## 2020-10-01 MED ORDER — IOHEXOL 300 MG/ML  SOLN
100.0000 mL | Freq: Once | INTRAMUSCULAR | Status: AC | PRN
  Administered 2020-10-02: 100 mL via INTRAVENOUS

## 2020-10-01 MED ORDER — DIPHENHYDRAMINE HCL 50 MG/ML IJ SOLN
25.0000 mg | Freq: Once | INTRAMUSCULAR | Status: AC
  Administered 2020-10-01: 25 mg via INTRAVENOUS
  Filled 2020-10-01: qty 1

## 2020-10-01 MED ORDER — HYDROMORPHONE HCL 1 MG/ML IJ SOLN
1.0000 mg | Freq: Once | INTRAMUSCULAR | Status: AC
  Administered 2020-10-01: 1 mg via INTRAVENOUS
  Filled 2020-10-01: qty 1

## 2020-10-01 NOTE — ED Triage Notes (Signed)
Pt arrived via [facility] from home. Pt has uterine fibroids and was supposed to be having a hysterectomy in two weeks. Pt complains of sudden onset very intense tearing abdominal pain tonight. Pt says the pain is across her entire lower abdomen and in her buttocks. Pt reports her left shoulder is broken.   EMS gave 200 mcg fentanyl that did not touch her pain.   Hx: Sickle cell, HTN, diabetes  20G RAC  Vitals from EMS:  BP: 127/70 HR: 98 O2: 100%

## 2020-10-01 NOTE — ED Provider Notes (Signed)
[facility] DEPT Provider Note: [name], MD, FACEP  CSN: [id number] MRN: [id number] ARRIVAL: 10/01/20 at [place]: RESA/RESA   CHIEF COMPLAINT  Abdominal Pain   HISTORY OF PRESENT ILLNESS  10/01/20 10:59 PM Cynthia Rosales is a 39 y.o. female with with a history of fibroids scheduled for [facility] and hysteroscopy with possible Myosure fibroidectomy 10/22/2020.  She tells me she also may be getting a total hysterectomy.  She states the [facility] is for "cancer cells" but I do not have access to any pathology report to support this.  She has been having pelvic pain for about a month.  This evening, about an hour prior to arrival, she was getting up from the toilet and felt the sudden onset of severe, tearing pain in her lower abdomen.  She describes the pain as feeling like her uterus is falling out.  Pain is worse with movement or palpation.  She denies any associated vaginal bleeding.  She was given 200 mcg of fentanyl by EMS prior to arrival without relief.  She also told EMS she has history of sickle cell disease but she has had multiple hemoglobin electrophoreses showing no evidence of hemoglobin S.  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  She has been noted to move both upper extremities but is weak in the lower extremities; this is consistent with documented history.  She also states she has a left shoulder fracture but in fact she has been treated recently for adhesive capsulitis of the left shoulder.   Past Medical History:  Diagnosis Date  . Abscess of bursa, left elbow 06/2013   Treated with I and D/antibiotics.   . Anemia   . Asthma   . Depression   . History of blood transfusion    "after I had one of my kids"  . Hypertension   . Ovarian cyst   . Renal disorder    kidney stones  . Type II diabetes mellitus ([place])     Past Surgical History:  Procedure Laterality Date   . APPENDECTOMY  2013  . BILIARY STENT PLACEMENT N/A 05/02/2018   Procedure: BILIARY STENT PLACEMENT;  Surgeon: Doran Stabler, MD;  [facility];  Service: Gastroenterology;  Laterality: N/A;  . CARPAL TUNNEL RELEASE    . CESAREAN SECTION  2000; 2007; 2011  . CHOLECYSTECTOMY N/A 04/03/2018   Procedure: LAPAROSCOPIC CHOLECYSTECTOMY;  Surgeon: Rolm Bookbinder, MD;  Location: [facility];  Service: General;  Laterality: N/A;  . DILATION AND CURETTAGE OF UTERUS    . ERCP N/A 05/02/2018   Procedure: ENDOSCOPIC RETROGRADE CHOLANGIOPANCREATOGRAPHY (ERCP);  Surgeon: Doran Stabler, MD;  Location[facility];  Service: Gastroenterology;  Laterality: N/A;  . ERCP N/A 07/26/2018   Procedure: ENDOSCOPIC RETROGRADE CHOLANGIOPANCREATOGRAPHY (ERCP);  Surgeon: Irene Shipper, MD;  Location[facility];  Service: Endoscopy;  Laterality: N/A;  stent removal  . IR RADIOLOGIST EVAL & MGMT  05/19/2018  . LAMINECTOMY N/A 12/13/2014   Procedure: CERVICAL LAMINECTOMY FOR INTRADURAL TUMOR;  Surgeon: Charlie Pitter, MD;  Location: [facility];  Service: Neurosurgery;  Laterality: N/A;  posterior  . LAPAROSCOPY N/A 07/20/2017   Procedure: LAPAROSCOPY OPERATIVE WITH WEDGE RESECTION RIGHT CORNUA AND PARTIAL SALPINGECTOMY;  Surgeon: [name], MD;  Location: [facility];  Service: Gynecology;  Laterality: N/A;  . REDUCTION MAMMAPLASTY Bilateral 1998  . REMOVAL OF STONES  05/02/2018   Procedure:  REMOVAL OF STONES;  Surgeon: Doran Stabler, MD;  Location[facility];  Service: Gastroenterology;;  . [name]  05/02/2018   Procedure: [name];  Surgeon: Doran Stabler, MD;  Location: [facility];  Service: Gastroenterology;;  . Lavell Islam REMOVAL  07/26/2018   Procedure: STENT REMOVAL;  Surgeon: Irene Shipper, MD;  [facility];  Service: Endoscopy;;    Family History  Problem Relation Age of Onset  . Diabetes Mother   . Hypertension Mother   . Breast cancer Maternal Aunt   . Ovarian  cancer Maternal Grandmother   . Breast cancer Maternal Aunt   . Migraines Neg Hx     Social History   Tobacco Use  . Smoking status: Former Smoker    Years: 0.00  . Smokeless tobacco: Never Used  . Tobacco comment: 03/27/2014 "smoked ~ 1 cigarette/day; quit in ~ 2013"  Vaping Use  . Vaping Use: Never used  Substance Use Topics  . Alcohol use: Never    Alcohol/week: 0.0 standard drinks  . Drug use: No    Prior to Admission medications   Medication Sig Start Date End Date Taking? Authorizing Provider  diazepam (VALIUM) 5 MG tablet Take 5 mg by mouth 2 (two) times daily as needed for muscle spasms.   Yes [provider]  hydrochlorothiazide (HYDRODIURIL) 25 MG tablet Take 25 mg by mouth daily.  04/27/18  Yes [provider]  NOVOLOG MIX 70/30 FLEXPEN (70-30) 100 UNIT/ML FlexPen Inject 36 Units into the skin in the morning, at noon, and at bedtime.  02/28/20  Yes [provider]  oxycodone (ROXICODONE) 30 MG immediate release tablet Take 1 tablet (30 mg total) by mouth every 6 (six) hours as needed for pain. Patient taking differently: Take 45 mg by mouth every 4 (four) hours as needed for pain.  06/01/20  Yes Dorena Dew, FNP  sitaGLIPtin (JANUVIA) 100 MG tablet Take 100 mg by mouth daily.   Yes [provider]    Allergies Amoxicillin, Buprenorphine hcl, and Morphine and related   REVIEW OF SYSTEMS  Negative except as noted here or in the History of Present Illness.   PHYSICAL EXAMINATION  Initial Vital Signs Blood pressure (!) 147/97, pulse (!) 107, temperature 97.9 F (36.6 C), temperature source Oral, resp. rate 17, SpO2 99 %.  Examination General: Well-developed, well-nourished female in no acute distress; appearance consistent with age of record HENT: normocephalic; atraumatic Eyes: pupils equal, round and reactive to light; extraocular muscles intact Neck: supple Heart: regular rate and rhythm Lungs: clear to auscultation  bilaterally Abdomen: soft; nondistended; lower abdominal tenderness; bowel sounds present Extremities: No deformity; pulses normal; trace edema of lower legs; tenderness of left shoulder with limited exam of left upper arm due to pain Neurologic: Awake, alert and oriented; paraparesis; no facial droop Skin: Warm and dry Psychiatric: Grimacing   RESULTS  Summary of this visit's results, reviewed and interpreted by myself:   EKG Interpretation  Date/Time:    Ventricular Rate:    PR Interval:    QRS Duration:   QT Interval:    QTC Calculation:   R Axis:     Text Interpretation:        Laboratory Studies: Results for orders placed or performed during the hospital encounter of 10/01/20 (from the past 24 hour(s))  Basic metabolic panel     Status: Abnormal   Collection Time: 10/01/20 10:40 PM  Result Value Ref Range   Sodium 134 (L) 135 - 145  mmol/L   Potassium 3.2 (L) 3.5 - 5.1 mmol/L   Chloride 102 98 - 111 mmol/L   CO2 23 22 - 32 mmol/L   Glucose, Bld 230 (H) 70 - 99 mg/dL   BUN 18 6 - 20 mg/dL   Creatinine, Ser 0.91 0.44 - 1.00 mg/dL   Calcium 8.7 (L) 8.9 - 10.3 mg/dL   GFR, Estimated >60 >60 mL/min   Anion gap 9 5 - 15  CBC with Differential/Platelet     Status: Abnormal   Collection Time: 10/01/20 10:40 PM  Result Value Ref Range   WBC 12.1 (H) 4.0 - 10.5 K/uL   RBC 4.59 3.87 - 5.11 MIL/uL   Hemoglobin 8.4 (L) 12.0 - 15.0 g/dL   HCT 28.6 (L) 36 - 46 %   MCV 62.3 (L) 80.0 - 100.0 fL   MCH 18.3 (L) 26.0 - 34.0 pg   MCHC 29.4 (L) 30.0 - 36.0 g/dL   RDW 20.6 (H) 11.5 - 15.5 %   Platelets 627 (H) 150 - 400 K/uL   nRBC 0.0 0.0 - 0.2 %   Neutrophils Relative % 55 %   Neutro Abs 6.6 1.7 - 7.7 K/uL   Lymphocytes Relative 39 %   Lymphs Abs 4.8 (H) 0.7 - 4.0 K/uL   Monocytes Relative 5 %   Monocytes Absolute 0.6 0.1 - 1.0 K/uL   Eosinophils Relative 1 %   Eosinophils Absolute 0.1 0.0 - 0.5 K/uL   Basophils Relative 0 %   Basophils Absolute 0.1 0.0 - 0.1 K/uL    Immature Granulocytes 0 %   Abs Immature Granulocytes 0.05 0.00 - 0.07 K/uL   Ovalocytes PRESENT   I-Stat Beta hCG blood, ED ([facility], [facility], [facility] only)     Status: None   Collection Time: 10/01/20 11:06 PM  Result Value Ref Range   I-stat hCG, quantitative <5.0 <5 mIU/mL   Comment 3          Urinalysis, Routine w reflex microscopic Urine, Clean Catch     Status: Abnormal   Collection Time: 10/02/20  5:00 AM  Result Value Ref Range   Color, Urine YELLOW YELLOW   APPearance HAZY (A) CLEAR   Specific Gravity, Urine >1.046 (H) 1.005 - 1.030   pH 5.0 5.0 - 8.0   Glucose, UA 50 (A) NEGATIVE mg/dL   Hgb urine dipstick SMALL (A) NEGATIVE   Bilirubin Urine NEGATIVE NEGATIVE   Ketones, ur NEGATIVE NEGATIVE mg/dL   Protein, ur NEGATIVE NEGATIVE mg/dL   Nitrite NEGATIVE NEGATIVE   Leukocytes,Ua NEGATIVE NEGATIVE   RBC / HPF 0-5 0 - 5 RBC/hpf   WBC, UA 0-5 0 - 5 WBC/hpf   Bacteria, UA RARE (A) NONE SEEN   Squamous Epithelial / LPF 6-10 0 - 5   Imaging Studies: US PELVIS (TRANSABDOMINAL ONLY)  Result Date: 10/02/2020 CLINICAL DATA:  Acute pelvic pain EXAM: TRANSABDOMINAL ULTRASOUND OF PELVIS DOPPLER ULTRASOUND OF OVARIES TECHNIQUE: Transabdominal ultrasound examination of the pelvis was performed including evaluation of the uterus, ovaries, adnexal regions, and pelvic cul-de-sac. Color and duplex Doppler ultrasound was utilized to evaluate blood flow to the ovaries. COMPARISON:  None. FINDINGS: Uterus Measurements: 17.1 x 7.2 x 10.8 cm = volume: 696 mL. 8 cm left fundal fibroid. Heterogeneous echotexture throughout the myometrium. Endometrium Thickness: Not well visualized. Right ovary Measurements: 4.3 x 2.8 x 3.3 cm = volume: 21 mL. Normal appearance/no adnexal mass. Left ovary Measurements: 4.5 x 2.1 x 3.2 cm = volume: 16 mL. Normal appearance/no adnexal mass.  Pulsed Doppler evaluation demonstrates normal low-resistance arterial and venous waveforms in both ovaries. Other: No free fluid. IMPRESSION:  Fibroid uterus. No acute findings. Electronically Signed   By: Rolm Baptise M.D.   On: 10/02/2020 02:47   CT ABDOMEN PELVIS W CONTRAST  Result Date: 10/02/2020 CLINICAL DATA:  Lower abdominal pain, buttock pain EXAM: CT ABDOMEN AND PELVIS WITH CONTRAST TECHNIQUE: Multidetector CT imaging of the abdomen and pelvis was performed using the standard protocol following bolus administration of intravenous contrast. CONTRAST:  121mL OMNIPAQUE IOHEXOL 300 MG/ML  SOLN COMPARISON:  08/11/2019 FINDINGS: Lower chest: No acute abnormality. Hepatobiliary: No focal liver abnormality is seen. Status post cholecystectomy. No biliary dilatation. Pancreas: No focal abnormality or ductal dilatation. Spleen: No focal abnormality.  Normal size. Adrenals/Urinary Tract: No adrenal abnormality. No focal renal abnormality. No stones or hydronephrosis. Urinary bladder is unremarkable. Stomach/Bowel: Prior appendectomy. Stomach, large and small bowel grossly unremarkable. Vascular/Lymphatic: No evidence of aneurysm or adenopathy. Reproductive: Enlarged fibroid uterus.  No adnexal mass. Other: No free fluid or free air. Musculoskeletal: No acute bony abnormality. IMPRESSION: Enlarged fibroid uterus. No acute findings in the abdomen or pelvis. Electronically Signed   By: Rolm Baptise M.D.   On: 10/02/2020 01:16   US ABDOMINAL PELVIC ART/VENT FLOW DOPPLER  Result Date: 10/02/2020 CLINICAL DATA:  Acute pelvic pain EXAM: TRANSABDOMINAL ULTRASOUND OF PELVIS DOPPLER ULTRASOUND OF OVARIES TECHNIQUE: Transabdominal ultrasound examination of the pelvis was performed including evaluation of the uterus, ovaries, adnexal regions, and pelvic cul-de-sac. Color and duplex Doppler ultrasound was utilized to evaluate blood flow to the ovaries. COMPARISON:  None. FINDINGS: Uterus Measurements: 17.1 x 7.2 x 10.8 cm = volume: 696 mL. 8 cm left fundal fibroid. Heterogeneous echotexture throughout the myometrium. Endometrium Thickness: Not well visualized.  Right ovary Measurements: 4.3 x 2.8 x 3.3 cm = volume: 21 mL. Normal appearance/no adnexal mass. Left ovary Measurements: 4.5 x 2.1 x 3.2 cm = volume: 16 mL. Normal appearance/no adnexal mass. Pulsed Doppler evaluation demonstrates normal low-resistance arterial and venous waveforms in both ovaries. Other: No free fluid. IMPRESSION: Fibroid uterus. No acute findings. Electronically Signed   By: Rolm Baptise M.D.   On: 10/02/2020 02:47    ED COURSE and MDM  Nursing notes, initial and subsequent vitals signs, including pulse oximetry, reviewed and interpreted by myself.  Vitals:   10/02/20 0130 10/02/20 0200 10/02/20 0230 10/02/20 0618  BP: (!) 137/92 128/69 125/73 121/73  Pulse: 87 98 90 95  Resp: 18 20 19 18   Temp:      TempSrc:      SpO2: 93% 100% 100% 100%   Medications  sodium chloride (PF) 0.9 % injection (has no administration in time range)  ondansetron (ZOFRAN) injection 4 mg (4 mg Intravenous Given 10/01/20 2257)  HYDROmorphone (DILAUDID) injection 1 mg (1 mg Intravenous Given 10/01/20 2258)  diphenhydrAMINE (BENADRYL) injection 25 mg (25 mg Intravenous Given 10/01/20 2257)  iohexol (OMNIPAQUE) 300 MG/ML solution 100 mL (100 mLs Intravenous Contrast Given 10/02/20 0058)  HYDROmorphone (DILAUDID) injection 1 mg (1 mg Intravenous Given 10/02/20 0157)  potassium chloride SA (KLOR-CON) CR tablet 40 mEq (40 mEq Oral Given 10/02/20 0156)  oxyCODONE (Oxy IR/ROXICODONE) immediate release tablet 30 mg (30 mg Oral Given 10/02/20 0324)  HYDROmorphone (DILAUDID) injection 1 mg (1 mg Intravenous Given 10/02/20 0622)   11:23 PM Patient states she has not gotten relief from an additional milligram of Dilaudid IV but she appears more calm and is no longer moaning.  1:32 AM No CT  findings to explain patient's pain.  Will obtain pelvic ultrasound and Dopplers.  2:37 AM PMPAware consulted.  Patient receives 160, 30 mg oxycodone tablets monthly.  The patient's microcytic anemia is chronic and not  significantly changed from prior studies.  3:10 AM The patient CT and ultrasound studies have been unremarkable apart from known fibroids.  The patient's pain could be due to urinary tract infection but so far she has not been able to provide a specimen.  She was assisted to the bathroom but missed the specimen collection "hat".  Will hydrate and attempt recollection.   6:03 AM The patient's urinalysis is negative for urinary tract infection.  The patient CT and ultrasound show no significant pathology apart from the known uterine fibroids.  An involuting/ischemic fibroid could cause significant pain.  The treatment for such would be analgesia and, as noted above, the patient is already on 120 mg of oxycodone daily.  I am not comfortable adding any narcotics onto the large quantity she is already scheduled.  I sent a message to her OB/GYN, Dr. Cheri Fowler, advising him of her visit and our findings.   PROCEDURES  Procedures   ED DIAGNOSES     ICD-10-CM   1. Pelvic pain in female  R10.2   2. Pain  R52 US PELVIS (TRANSABDOMINAL ONLY)    US PELVIS (TRANSABDOMINAL ONLY)    CANCELED: US PELVIC COMPLETE WITH TRANSVAGINAL  3. History of uterine fibroid  Z86.018        Brynnan Rodenbaugh, Jenny Reichmann, MD 10/02/20 [phone]

## 2020-10-02 ENCOUNTER — Encounter (HOSPITAL_COMMUNITY): Payer: Self-pay

## 2020-10-02 ENCOUNTER — Emergency Department (HOSPITAL_COMMUNITY): Payer: Medicare HMO

## 2020-10-02 DIAGNOSIS — E119 Type 2 diabetes mellitus without complications: Secondary | ICD-10-CM | POA: Diagnosis not present

## 2020-10-02 DIAGNOSIS — E1140 Type 2 diabetes mellitus with diabetic neuropathy, unspecified: Secondary | ICD-10-CM | POA: Diagnosis not present

## 2020-10-02 DIAGNOSIS — R279 Unspecified lack of coordination: Secondary | ICD-10-CM | POA: Diagnosis not present

## 2020-10-02 DIAGNOSIS — R102 Pelvic and perineal pain: Secondary | ICD-10-CM | POA: Diagnosis not present

## 2020-10-02 DIAGNOSIS — Z743 Need for continuous supervision: Secondary | ICD-10-CM | POA: Diagnosis not present

## 2020-10-02 DIAGNOSIS — R5381 Other malaise: Secondary | ICD-10-CM | POA: Diagnosis not present

## 2020-10-02 DIAGNOSIS — D259 Leiomyoma of uterus, unspecified: Secondary | ICD-10-CM | POA: Diagnosis not present

## 2020-10-02 DIAGNOSIS — I10 Essential (primary) hypertension: Secondary | ICD-10-CM | POA: Diagnosis not present

## 2020-10-02 LAB — URINALYSIS, ROUTINE W REFLEX MICROSCOPIC
Bilirubin Urine: NEGATIVE
Glucose, UA: 50 mg/dL — AB
Ketones, ur: NEGATIVE mg/dL
Leukocytes,Ua: NEGATIVE
Nitrite: NEGATIVE
Protein, ur: NEGATIVE mg/dL
Specific Gravity, Urine: >1.046 — ABNORMAL HIGH (ref 1.005–1.030)
pH: 5 (ref 5.0–8.0)

## 2020-10-02 LAB — CBC WITH DIFFERENTIAL/PLATELET
Abs Immature Granulocytes: 0.05 10*3/uL (ref 0.00–0.07)
Basophils Absolute: 0.1 10*3/uL (ref 0.0–0.1)
Basophils Relative: 0 %
Eosinophils Absolute: 0.1 10*3/uL (ref 0.0–0.5)
Eosinophils Relative: 1 %
HCT: 28.6 % — ABNORMAL LOW (ref 36.0–46.0)
Hemoglobin: 8.4 g/dL — ABNORMAL LOW (ref 12.0–15.0)
Immature Granulocytes: 0 %
Lymphocytes Relative: 39 %
Lymphs Abs: 4.8 10*3/uL — ABNORMAL HIGH (ref 0.7–4.0)
MCH: 18.3 pg — ABNORMAL LOW (ref 26.0–34.0)
MCHC: 29.4 g/dL — ABNORMAL LOW (ref 30.0–36.0)
MCV: 62.3 fL — ABNORMAL LOW (ref 80.0–100.0)
Monocytes Absolute: 0.6 10*3/uL (ref 0.1–1.0)
Monocytes Relative: 5 %
Neutro Abs: 6.6 10*3/uL (ref 1.7–7.7)
Neutrophils Relative %: 55 %
Platelets: 627 10*3/uL — ABNORMAL HIGH (ref 150–400)
RBC: 4.59 MIL/uL (ref 3.87–5.11)
RDW: 20.6 % — ABNORMAL HIGH (ref 11.5–15.5)
WBC: 12.1 10*3/uL — ABNORMAL HIGH (ref 4.0–10.5)
nRBC: 0 % (ref 0.0–0.2)

## 2020-10-02 MED ORDER — HYDROMORPHONE HCL 1 MG/ML IJ SOLN
1.0000 mg | Freq: Once | INTRAMUSCULAR | Status: AC
  Administered 2020-10-02: 1 mg via INTRAVENOUS
  Filled 2020-10-02: qty 1

## 2020-10-02 MED ORDER — POTASSIUM CHLORIDE CRYS ER 20 MEQ PO TBCR
40.0000 meq | EXTENDED_RELEASE_TABLET | Freq: Once | ORAL | Status: AC
  Administered 2020-10-02: 40 meq via ORAL
  Filled 2020-10-02: qty 2

## 2020-10-02 MED ORDER — OXYCODONE HCL 5 MG PO TABS
30.0000 mg | ORAL_TABLET | Freq: Once | ORAL | Status: AC
  Administered 2020-10-02: 30 mg via ORAL
  Filled 2020-10-02: qty 6

## 2020-10-02 MED ORDER — SODIUM CHLORIDE (PF) 0.9 % IJ SOLN
INTRAMUSCULAR | Status: AC
  Filled 2020-10-02: qty 50

## 2020-10-02 NOTE — ED Notes (Signed)
Patient assisted to the restroom with wheelchair, unable to obtain urine sample due to missing the hat for collection. EDP made aware.

## 2020-10-02 NOTE — Discharge Instructions (Signed)
Your CT scan and ultrasound studies did not show any significant pathology apart from the fibroids which you were already aware of.  There was no evidence of a tumor, infection, ovarian cyst or ovarian torsion.  You are already on a large dose of oxycodone and it is not appropriate to add additional narcotics in the emergency department.  Please contact your pain management physician if your pain is not adequately managed.  I have sent a note to Dr. Willis Modena advising him of our findings.

## 2020-10-02 NOTE — ED Notes (Signed)
This nurse attempted to help patient to the restroom, patient refused stating she cannot urinate at this time. Cup of water provided and this nurse asked patient in about 30 minutes to attempt

## 2020-10-02 NOTE — ED Notes (Signed)
Patient in bed next to patient began yelling during their stay. This distruption caused patient became upset and started yelling and cursing "I need my fu**ing nurse right now, I am in pain and I shouldn't have to put up with this sh*t" This nurse entered the room and asked patient why she was upset and asked to stop yelling and using inappropriate language. Patient was tearful and states "I am in pain and she is yelling, I want my pain medication now" This nurse reminded patient that at this time the doctor has not ordered anything else for pain until we can check a urine sample but that the roommate has left the room and should not disturb her any longer.

## 2020-10-02 NOTE — ED Notes (Signed)
Patient again asking for pain medication, this nurse explained that the doctor is aware but is waiting to check a urine sample to make sure we can identify exactly what is causing her pain. Patient states she never yelled or cursed at staff earlier and feels like she is being denied pain medication due to that situation. This nurse ensured patient that we have given her pain medication and are trying to rule out all potential causes for pain, that the prior situation is no reason that we would not provide the best care possible. Patient verbalized understanding and will attempt to provide a sample.

## 2020-10-02 NOTE — ED Notes (Addendum)
Around 0230 this Probation officer, and other staff members, and [employer] were dealing with a patient in adjacent bed. Patient in [place] [facility] began yelling, screaming, and using profanity at staff members and at [facility]. While this Probation officer and other staff members were attempting to calm the situation, this patient began also screaming with the other patient. This patient screamed "where the fu** is my nurse. I have been waiting for hours for my fu**ing nurse. I am in pain. I shouldn't have to put up with this Sh**. more than one time. Staff instructed  patient that the cursing was not necessary due to having another verbally combative patient beside her. Patient was informed that cursing at staff members would not be tolerated. Patient then later tried to apologize to staff but denied any claims of cursing at staff members. Pt stated "I never cussed at anyone. I did not say that."

## 2020-10-02 NOTE — ED Notes (Addendum)
Patient states due to her medical history she normally gets transported home via [place]. [facility] called for transport.

## 2020-10-03 DIAGNOSIS — I10 Essential (primary) hypertension: Secondary | ICD-10-CM | POA: Diagnosis not present

## 2020-10-03 DIAGNOSIS — E119 Type 2 diabetes mellitus without complications: Secondary | ICD-10-CM | POA: Diagnosis not present

## 2020-10-03 DIAGNOSIS — E1140 Type 2 diabetes mellitus with diabetic neuropathy, unspecified: Secondary | ICD-10-CM | POA: Diagnosis not present

## 2020-10-04 DIAGNOSIS — E1140 Type 2 diabetes mellitus with diabetic neuropathy, unspecified: Secondary | ICD-10-CM | POA: Diagnosis not present

## 2020-10-04 DIAGNOSIS — I10 Essential (primary) hypertension: Secondary | ICD-10-CM | POA: Diagnosis not present

## 2020-10-04 DIAGNOSIS — E119 Type 2 diabetes mellitus without complications: Secondary | ICD-10-CM | POA: Diagnosis not present

## 2020-10-05 DIAGNOSIS — E1140 Type 2 diabetes mellitus with diabetic neuropathy, unspecified: Secondary | ICD-10-CM | POA: Diagnosis not present

## 2020-10-05 DIAGNOSIS — I10 Essential (primary) hypertension: Secondary | ICD-10-CM | POA: Diagnosis not present

## 2020-10-05 DIAGNOSIS — E119 Type 2 diabetes mellitus without complications: Secondary | ICD-10-CM | POA: Diagnosis not present

## 2020-10-06 DIAGNOSIS — E119 Type 2 diabetes mellitus without complications: Secondary | ICD-10-CM | POA: Diagnosis not present

## 2020-10-06 DIAGNOSIS — I10 Essential (primary) hypertension: Secondary | ICD-10-CM | POA: Diagnosis not present

## 2020-10-06 DIAGNOSIS — E1140 Type 2 diabetes mellitus with diabetic neuropathy, unspecified: Secondary | ICD-10-CM | POA: Diagnosis not present

## 2020-10-07 DIAGNOSIS — E119 Type 2 diabetes mellitus without complications: Secondary | ICD-10-CM | POA: Diagnosis not present

## 2020-10-07 DIAGNOSIS — I10 Essential (primary) hypertension: Secondary | ICD-10-CM | POA: Diagnosis not present

## 2020-10-07 DIAGNOSIS — E1140 Type 2 diabetes mellitus with diabetic neuropathy, unspecified: Secondary | ICD-10-CM | POA: Diagnosis not present

## 2020-10-08 DIAGNOSIS — E119 Type 2 diabetes mellitus without complications: Secondary | ICD-10-CM | POA: Diagnosis not present

## 2020-10-08 DIAGNOSIS — E1140 Type 2 diabetes mellitus with diabetic neuropathy, unspecified: Secondary | ICD-10-CM | POA: Diagnosis not present

## 2020-10-08 DIAGNOSIS — I10 Essential (primary) hypertension: Secondary | ICD-10-CM | POA: Diagnosis not present

## 2020-10-09 DIAGNOSIS — E1140 Type 2 diabetes mellitus with diabetic neuropathy, unspecified: Secondary | ICD-10-CM | POA: Diagnosis not present

## 2020-10-09 DIAGNOSIS — E119 Type 2 diabetes mellitus without complications: Secondary | ICD-10-CM | POA: Diagnosis not present

## 2020-10-09 DIAGNOSIS — I10 Essential (primary) hypertension: Secondary | ICD-10-CM | POA: Diagnosis not present

## 2020-10-10 DIAGNOSIS — E1140 Type 2 diabetes mellitus with diabetic neuropathy, unspecified: Secondary | ICD-10-CM | POA: Diagnosis not present

## 2020-10-10 DIAGNOSIS — I10 Essential (primary) hypertension: Secondary | ICD-10-CM | POA: Diagnosis not present

## 2020-10-10 DIAGNOSIS — E119 Type 2 diabetes mellitus without complications: Secondary | ICD-10-CM | POA: Diagnosis not present

## 2020-10-11 DIAGNOSIS — I10 Essential (primary) hypertension: Secondary | ICD-10-CM | POA: Diagnosis not present

## 2020-10-11 DIAGNOSIS — E1140 Type 2 diabetes mellitus with diabetic neuropathy, unspecified: Secondary | ICD-10-CM | POA: Diagnosis not present

## 2020-10-11 DIAGNOSIS — E119 Type 2 diabetes mellitus without complications: Secondary | ICD-10-CM | POA: Diagnosis not present

## 2020-10-12 DIAGNOSIS — E1140 Type 2 diabetes mellitus with diabetic neuropathy, unspecified: Secondary | ICD-10-CM | POA: Diagnosis not present

## 2020-10-12 DIAGNOSIS — E119 Type 2 diabetes mellitus without complications: Secondary | ICD-10-CM | POA: Diagnosis not present

## 2020-10-12 DIAGNOSIS — I10 Essential (primary) hypertension: Secondary | ICD-10-CM | POA: Diagnosis not present

## 2020-10-13 DIAGNOSIS — E1140 Type 2 diabetes mellitus with diabetic neuropathy, unspecified: Secondary | ICD-10-CM | POA: Diagnosis not present

## 2020-10-13 DIAGNOSIS — I10 Essential (primary) hypertension: Secondary | ICD-10-CM | POA: Diagnosis not present

## 2020-10-13 DIAGNOSIS — E119 Type 2 diabetes mellitus without complications: Secondary | ICD-10-CM | POA: Diagnosis not present

## 2020-10-14 DIAGNOSIS — I10 Essential (primary) hypertension: Secondary | ICD-10-CM | POA: Diagnosis not present

## 2020-10-14 DIAGNOSIS — E1140 Type 2 diabetes mellitus with diabetic neuropathy, unspecified: Secondary | ICD-10-CM | POA: Diagnosis not present

## 2020-10-14 DIAGNOSIS — E119 Type 2 diabetes mellitus without complications: Secondary | ICD-10-CM | POA: Diagnosis not present

## 2020-10-15 DIAGNOSIS — I10 Essential (primary) hypertension: Secondary | ICD-10-CM | POA: Diagnosis not present

## 2020-10-15 DIAGNOSIS — E119 Type 2 diabetes mellitus without complications: Secondary | ICD-10-CM | POA: Diagnosis not present

## 2020-10-15 DIAGNOSIS — E1140 Type 2 diabetes mellitus with diabetic neuropathy, unspecified: Secondary | ICD-10-CM | POA: Diagnosis not present

## 2020-10-16 DIAGNOSIS — E1140 Type 2 diabetes mellitus with diabetic neuropathy, unspecified: Secondary | ICD-10-CM | POA: Diagnosis not present

## 2020-10-16 DIAGNOSIS — E119 Type 2 diabetes mellitus without complications: Secondary | ICD-10-CM | POA: Diagnosis not present

## 2020-10-16 DIAGNOSIS — I10 Essential (primary) hypertension: Secondary | ICD-10-CM | POA: Diagnosis not present

## 2020-10-17 DIAGNOSIS — I10 Essential (primary) hypertension: Secondary | ICD-10-CM | POA: Diagnosis not present

## 2020-10-17 DIAGNOSIS — E1140 Type 2 diabetes mellitus with diabetic neuropathy, unspecified: Secondary | ICD-10-CM | POA: Diagnosis not present

## 2020-10-17 DIAGNOSIS — E119 Type 2 diabetes mellitus without complications: Secondary | ICD-10-CM | POA: Diagnosis not present

## 2020-10-18 ENCOUNTER — Other Ambulatory Visit (HOSPITAL_COMMUNITY)
Admission: RE | Admit: 2020-10-18 | Discharge: 2020-10-18 | Disposition: A | Discharge status: Home / Self Care | Payer: Medicare HMO | Source: Ambulatory Visit | Attending: Obstetrics and Gynecology | Admitting: Obstetrics and Gynecology

## 2020-10-18 DIAGNOSIS — Z20822 Contact with and (suspected) exposure to covid-19: Secondary | ICD-10-CM | POA: Diagnosis not present

## 2020-10-18 DIAGNOSIS — I10 Essential (primary) hypertension: Secondary | ICD-10-CM | POA: Diagnosis not present

## 2020-10-18 DIAGNOSIS — Z01812 Encounter for preprocedural laboratory examination: Secondary | ICD-10-CM | POA: Diagnosis not present

## 2020-10-18 DIAGNOSIS — E1140 Type 2 diabetes mellitus with diabetic neuropathy, unspecified: Secondary | ICD-10-CM | POA: Diagnosis not present

## 2020-10-18 DIAGNOSIS — E119 Type 2 diabetes mellitus without complications: Secondary | ICD-10-CM | POA: Diagnosis not present

## 2020-10-18 LAB — SARS CORONAVIRUS 2 (TAT 6-24 HRS): SARS Coronavirus 2: NEGATIVE

## 2020-10-19 DIAGNOSIS — E119 Type 2 diabetes mellitus without complications: Secondary | ICD-10-CM | POA: Diagnosis not present

## 2020-10-19 DIAGNOSIS — E1140 Type 2 diabetes mellitus with diabetic neuropathy, unspecified: Secondary | ICD-10-CM | POA: Diagnosis not present

## 2020-10-19 DIAGNOSIS — I10 Essential (primary) hypertension: Secondary | ICD-10-CM | POA: Diagnosis not present

## 2020-10-20 DIAGNOSIS — E1140 Type 2 diabetes mellitus with diabetic neuropathy, unspecified: Secondary | ICD-10-CM | POA: Diagnosis not present

## 2020-10-20 DIAGNOSIS — I10 Essential (primary) hypertension: Secondary | ICD-10-CM | POA: Diagnosis not present

## 2020-10-20 DIAGNOSIS — E119 Type 2 diabetes mellitus without complications: Secondary | ICD-10-CM | POA: Diagnosis not present

## 2020-10-21 ENCOUNTER — Encounter (HOSPITAL_COMMUNITY): Payer: Self-pay | Admitting: Obstetrics and Gynecology

## 2020-10-21 DIAGNOSIS — E119 Type 2 diabetes mellitus without complications: Secondary | ICD-10-CM | POA: Diagnosis not present

## 2020-10-21 DIAGNOSIS — E1140 Type 2 diabetes mellitus with diabetic neuropathy, unspecified: Secondary | ICD-10-CM | POA: Diagnosis not present

## 2020-10-21 DIAGNOSIS — I10 Essential (primary) hypertension: Secondary | ICD-10-CM | POA: Diagnosis not present

## 2020-10-21 NOTE — Progress Notes (Signed)
Spoke with pt for pre-op call. Pt denies cardiac history. Pt states she is treated for HTN and Type 1 diabetes. Pt states that her fasting blood sugar is usually around 108. She doesn't know what or when her most recent A1C is.  Instructed pt to take 70% of her Novolog 70/30 this evening (she will take 25 units) and take 50% in the AM (she will take 18 units). She voiced understanding. Instructed her to check her blood sugar in the AM.  If blood sugar is 70 or below, treat with 1/2 cup of clear juice (apple or cranberry) and recheck blood sugar 15 minutes after drinking juice. Instructed pt to let her nurse know on arrival that she had low blood sugar and drank juice.Instructed pt not to take her Januvia day of surgery. Pt states she has Sickle Cell Anemia, sees Dr. Doreene Burke.   Covid test done 10/18/20 and it's negative.  Pt states she's been in quarantine since the test was done and understands that she stays in quarantine until she comes to the hospital tomorrow.  Chart sent to Anesthesia PA for review.

## 2020-10-21 NOTE — Anesthesia Preprocedure Evaluation (Addendum)
Anesthesia Evaluation  Patient identified by MRN, date of birth, ID band Patient awake    Reviewed: Allergy & Precautions, NPO status , Patient's Chart, lab work & pertinent test results  Airway Mallampati: III  TM Distance: >3 FB Neck ROM: Full    Dental no notable dental hx. 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  Pt does walk.. Chronic pain: narcotics    GI/Hepatic negative GI ROS, Neg liver ROS,   Endo/Other  diabetes, Type 2, Oral Hypoglycemic Agents, Insulin DependentMorbid obesity  Renal/GU negative Renal ROS  negative genitourinary   Musculoskeletal  (+) Arthritis , S/p C3-6 laminectomy   Abdominal   Peds  Hematology negative hematology ROS (+) Sickle cell anemia , Hgb 8.4   Anesthesia Other Findings AUB  Reproductive/Obstetrics                           Anesthesia Physical Anesthesia Plan  ASA: III  Anesthesia Plan: General   Post-op Pain Management:    Induction: Intravenous  PONV Risk Score and Plan: 3 and Midazolam, Dexamethasone and Ondansetron  Airway Management Planned: Oral ETT and LMA  Additional Equipment:   Intra-op Plan:   Post-operative Plan: Extubation in OR  Informed Consent: I have reviewed the patients History and Physical, chart, labs and discussed the procedure including the risks, benefits and alternatives for the proposed anesthesia with the patient or authorized representative who has  indicated his/her understanding and acceptance.     Dental advisory given  Plan Discussed with: CRNA  Anesthesia Plan Comments: (PAT note written 10/21/2020 by Myra Gianotti, PA-C. )       Anesthesia Quick Evaluation

## 2020-10-21 NOTE — Progress Notes (Signed)
Anesthesia Chart Review: Cynthia Rosales   Case: [id number] Date/Time: 10/22/20 0715   Procedure: DILATATION & CURETTAGE/HYSTEROSCOPY WITH POSSIBL MYOSURE (N/A )   Anesthesia type: Choice   Pre-op diagnosis: abnormal uterine bleeding   [facility] 08 / [facility]   Surgeons: Cheri Fowler, MD      DISCUSSION: Patient is a 39 year old female scheduled for the above procedure.  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   Reported history of sickle cell anemia with ED visit 04/08/20 and 06/01/20. Of note, in reviewing her ED records, there is mention of a negative sickle cell screen on 08/22/18 and Hgb S Quant 0.0 in 2014 (09/05/13) but that she reported being told twice in the past that she does have sickle cell disease. She is treated for chronic pain. Reportedly sickle cell disease is followed by Angelica Chessman, MD, but last notation seen is from 04/2018.   No recent A1c seen. She reported home fasting CBGs ~ 108.  10/18/20 presurgical COVID-19 test was negative. She is a same day work-up, so she will get labs and anesthesia team evaluation on the day of surgery. Her last known EKG is > 1 year ago. Her HGB has ranged from 8.4-8.6 since 04/04/2020, so will place an order for "Sample to Blood Bank", but will defer to surgeon and/or anesthesiologist if T&S warranted based on day of surgery lab results.     VS:   Wt Readings from Last 3 Encounters:  06/01/20 (!) 140.6 kg  04/08/20 136.1 kg  12/19/18 (!) 155.1 kg   BP Readings from Last 3 Encounters:  10/02/20 121/73  06/01/20 (!) 142/105  04/08/20 (!) 142/75   Pulse Readings from Last 3 Encounters:  10/02/20 95  06/01/20 (!) 112  04/08/20 97      PROVIDERS: Kerin Perna, NP is listed as PCP   LABS: For day of surgery. Currently, last lab results include: Lab Results  Component Value Date   WBC 12.1 (H) 10/01/2020   HGB 8.4 (L) 10/01/2020   HCT 28.6 (L) 10/01/2020   PLT 627 (H) 10/01/2020   GLUCOSE 230 (H) 10/01/2020   ALT 12 04/08/2020   AST 13 (L) 04/08/2020   NA 134 (L) 10/01/2020   K 3.2 (L) 10/01/2020   CL 102 10/01/2020   CREATININE 0.91 10/01/2020   BUN 18 10/01/2020   CO2 23 10/01/2020     IMAGES: CT abd/pelvis 10/02/20: IMPRESSION: Enlarged fibroid uterus. No acute findings in the abdomen or pelvis.   EKG: Last EKG seen from 04/19/18 showed NSR.   CV: N/A   Past Medical History:  Diagnosis Date  . Abscess of bursa, left elbow 06/2013   Treated with I and D/antibiotics.   . Anemia   . Arthritis   . Asthma   . Depression   . History of blood transfusion    "after I had one of my kids"  . History of kidney stones   . Hypertension   . Ovarian cyst   . Schizophrenia ([place])   . Sickle cell anemia (HCC)   . Type II diabetes mellitus ([place])     Past Surgical History:  Procedure Laterality Date  . APPENDECTOMY  2013  . BILIARY STENT PLACEMENT N/A 05/02/2018   Procedure: BILIARY STENT PLACEMENT;  Surgeon: Loletha Carrow,  Kirke Corin, MD;  Location: [facility];  Service: Gastroenterology;  Laterality: N/A;  . CARPAL TUNNEL RELEASE    . CESAREAN SECTION  2000; 2007; 2011  . CHOLECYSTECTOMY N/A 04/03/2018   Procedure: LAPAROSCOPIC CHOLECYSTECTOMY;  Surgeon: Rolm Bookbinder, MD;  Location: [facility];  Service: General;  Laterality: N/A;  . DILATION AND CURETTAGE OF UTERUS    . ERCP N/A 05/02/2018   Procedure: ENDOSCOPIC RETROGRADE CHOLANGIOPANCREATOGRAPHY (ERCP);  Surgeon: Doran Stabler, MD;  Location: [facility];  Service: Gastroenterology;  Laterality: N/A;  . ERCP N/A 07/26/2018   Procedure: ENDOSCOPIC RETROGRADE CHOLANGIOPANCREATOGRAPHY (ERCP);  Surgeon: Irene Shipper, MD;  Location: [facility];  Service: Endoscopy;  Laterality: N/A;  stent removal  . IR RADIOLOGIST EVAL & MGMT  05/19/2018  . LAMINECTOMY N/A 12/13/2014   Procedure: CERVICAL LAMINECTOMY FOR INTRADURAL TUMOR;  Surgeon: Charlie Pitter, MD;  Location: [facility];  Service: Neurosurgery;  Laterality: N/A;  posterior  . LAPAROSCOPY N/A 07/20/2017   Procedure: LAPAROSCOPY OPERATIVE WITH WEDGE RESECTION RIGHT CORNUA AND PARTIAL SALPINGECTOMY;  Surgeon: [name], MD;  Location: [facility];  Service: Gynecology;  Laterality: N/A;  . REDUCTION MAMMAPLASTY Bilateral 1998  . REMOVAL OF STONES  05/02/2018   Procedure: REMOVAL OF STONES;  Surgeon: Doran Stabler, MD;  Location: [facility];  Service: Gastroenterology;;  . [name]  05/02/2018   Procedure: SPHINCTEROTOMY;  Surgeon: Doran Stabler, MD;  [facility];  Service: Gastroenterology;;  . Lavell Islam REMOVAL  07/26/2018   Procedure: STENT REMOVAL;  Surgeon: Irene Shipper, MD;  Location: [facility];  Service: Endoscopy;;    MEDICATIONS: No current facility-administered medications for this encounter.   . diazepam (VALIUM) 5 MG tablet  . hydrochlorothiazide (HYDRODIURIL) 25 MG tablet  . NOVOLOG MIX 70/30 FLEXPEN (70-30) 100 UNIT/ML FlexPen  . oxycodone (ROXICODONE) 30 MG immediate release tablet  . sitaGLIPtin (JANUVIA) 100 MG tablet    Myra Gianotti, PA-C Surgical Short Stay/Anesthesiology [facility] Phone [phone] [facility] Phone [phone] 10/21/2020 4:51 PM

## 2020-10-21 NOTE — H&P (Signed)
Cynthia Rosales is an 39 y.o. female. She was seen by one of our NPs in June as a new patient, referred for heavy, painful menses.  Over the past several months, menses are heavier with clots, more painful, closer together and more irregular.  Ultrasound in our office with 5 and 10 cm myomas, endometrium difficult to evaluate due to myomas.  Our NP was unable to even adequately visualize the cervix for endometrial sampling.  She is interested in hysterectomy, but we have discussed the importance of evaluating the endometrium for hyperplasia/neoplasia prior to doing hysterectomy.  Pertinent Gynecological History: OB History: G5, P3023   Menstrual History: No LMP recorded. (Menstrual status: Irregular Periods).    Past Medical History:  Diagnosis Date  . Abscess of bursa, left elbow 06/2013   Treated with I and D/antibiotics.   . Anemia   . Arthritis   . Asthma   . Depression   . History of blood transfusion    "after I had one of my kids"  . History of kidney stones   . Hypertension   . Ovarian cyst   . Schizophrenia ([place])   . Sickle cell anemia (HCC)   . Type II diabetes mellitus ([place])     Past Surgical History:  Procedure Laterality Date  . APPENDECTOMY  2013  . BILIARY STENT PLACEMENT N/A 05/02/2018   Procedure: BILIARY STENT PLACEMENT;  Surgeon: Doran Stabler, MD;  [facility];  Service: Gastroenterology;  Laterality: N/A;  . CARPAL TUNNEL RELEASE    . CESAREAN SECTION  2000; 2007; 2011  . CHOLECYSTECTOMY N/A 04/03/2018   Procedure: LAPAROSCOPIC CHOLECYSTECTOMY;  Surgeon: Rolm Bookbinder, MD;  Location: [facility];  Service: General;  Laterality: N/A;  . DILATION AND CURETTAGE OF UTERUS    . ERCP N/A 05/02/2018   Procedure: ENDOSCOPIC RETROGRADE CHOLANGIOPANCREATOGRAPHY (ERCP);  Surgeon: Doran Stabler, MD;  Location: [facility];  Service: Gastroenterology;  Laterality: N/A;  . ERCP N/A 07/26/2018   Procedure: ENDOSCOPIC RETROGRADE CHOLANGIOPANCREATOGRAPHY  (ERCP);  Surgeon: Irene Shipper, MD;  Location: [facility];  Service: Endoscopy;  Laterality: N/A;  stent removal  . IR RADIOLOGIST EVAL & MGMT  05/19/2018  . LAMINECTOMY N/A 12/13/2014   Procedure: CERVICAL LAMINECTOMY FOR INTRADURAL TUMOR;  Surgeon: Charlie Pitter, MD;  Location: [facility];  Service: Neurosurgery;  Laterality: N/A;  posterior  . LAPAROSCOPY N/A 07/20/2017   Procedure: LAPAROSCOPY OPERATIVE WITH WEDGE RESECTION RIGHT CORNUA AND PARTIAL SALPINGECTOMY;  Surgeon: [name], MD;  Location: [facility];  Service: Gynecology;  Laterality: N/A;  . REDUCTION MAMMAPLASTY Bilateral 1998  . REMOVAL OF STONES  05/02/2018   Procedure: REMOVAL OF STONES;  Surgeon: Doran Stabler, MD;  Location: [facility];  Service: Gastroenterology;;  . [name]  05/02/2018   Procedure: SPHINCTEROTOMY;  Surgeon: Doran Stabler, MD;  Location: [facility];  Service: Gastroenterology;;  . Lavell Islam REMOVAL  07/26/2018   Procedure: STENT REMOVAL;  Surgeon: Irene Shipper, MD;  [facility];  Service: Endoscopy;;    Family History  Problem Relation Age of Onset  . Diabetes Mother   . Hypertension Mother   . Breast cancer Maternal Aunt   . Ovarian cancer Maternal Grandmother   . Breast cancer Maternal Aunt   . Migraines Neg Hx     Social History:  reports that she has quit smoking. She quit after 0.00 years of use. She has never used smokeless tobacco. She reports that she does not  drink alcohol and does not use drugs.  Allergies:  Allergies  Allergen Reactions  . Amoxicillin Hives, Rash and Other (See Comments)    PATIENT HAS HAD A PCN REACTION WITH IMMEDIATE RASH, FACIAL/TONGUE/THROAT SWELLING, SOB, OR LIGHTHEADEDNESS WITH HYPOTENSION:  #  #  YES  #  #  Has patient had a PCN reaction causing severe rash involving mucus membranes or skin necrosis: No Has patient had a PCN reaction that required hospitalization: No  Has patient had a PCN reaction occurring within the last 10 years:  #  #  #  YES  #  #  #  If all of the above answers are "NO", then may proceed with Cephalosporin use.    . Buprenorphine Hcl Itching and Nausea Only  . Morphine And Related Itching and Nausea Only    No medications prior to admission.    Review of Systems  Respiratory: Negative.   Cardiovascular: Negative.     There were no vitals taken for this visit. Physical Exam Constitutional:      Appearance: Normal appearance.  Cardiovascular:     Rate and Rhythm: Normal rate and regular rhythm.  Pulmonary:     Effort: Pulmonary effort is normal. No respiratory distress.  Abdominal:     General: There is no distension.     Palpations: Abdomen is soft. There is no mass.  Genitourinary:    Comments: Uterus slightly enlarged and irregular No adnexal mass Neurological:     Mental Status: She is alert.     No results found for this or any previous visit (from the past 24 hour(s)).  No results found.  Assessment/Plan: Menorrhagia, dysmenorrhea, 2 large uterine myomas, unable to evaluate the endometrium in the office.  All medical and surgical options have been discussed, she is interested in definitive surgical, but have discussed the importance of evaluating her endometrium first.  Surgical procedure, risks, alternatives, chances of relieving symptoms discussed, questions answered.  Will admit for EUA, hysteroscopy, D&C, possible myosure resection if submucosal myoma  Blane Ohara Julena Barbour 10/21/2020, 5:05 PM

## 2020-10-22 ENCOUNTER — Ambulatory Visit (HOSPITAL_COMMUNITY)
Admission: RE | Admit: 2020-10-22 | Discharge: 2020-10-22 | Disposition: A | Discharge status: Home / Self Care | Payer: Medicare HMO | Attending: Obstetrics and Gynecology | Admitting: Obstetrics and Gynecology

## 2020-10-22 ENCOUNTER — Other Ambulatory Visit: Payer: Self-pay

## 2020-10-22 ENCOUNTER — Encounter (HOSPITAL_COMMUNITY)
Admission: RE | Disposition: A | Discharge status: Home / Self Care | Payer: Self-pay | Source: Home / Self Care | Attending: Obstetrics and Gynecology

## 2020-10-22 ENCOUNTER — Ambulatory Visit (HOSPITAL_COMMUNITY): Payer: Medicare HMO | Admitting: Anesthesiology

## 2020-10-22 ENCOUNTER — Encounter (HOSPITAL_COMMUNITY): Payer: Self-pay | Admitting: Obstetrics and Gynecology

## 2020-10-22 DIAGNOSIS — Z7984 Long term (current) use of oral hypoglycemic drugs: Secondary | ICD-10-CM | POA: Insufficient documentation

## 2020-10-22 DIAGNOSIS — Z794 Long term (current) use of insulin: Secondary | ICD-10-CM | POA: Diagnosis not present

## 2020-10-22 DIAGNOSIS — N939 Abnormal uterine and vaginal bleeding, unspecified: Secondary | ICD-10-CM | POA: Diagnosis not present

## 2020-10-22 DIAGNOSIS — D251 Intramural leiomyoma of uterus: Secondary | ICD-10-CM | POA: Diagnosis not present

## 2020-10-22 DIAGNOSIS — N946 Dysmenorrhea, unspecified: Secondary | ICD-10-CM | POA: Insufficient documentation

## 2020-10-22 DIAGNOSIS — E119 Type 2 diabetes mellitus without complications: Secondary | ICD-10-CM | POA: Diagnosis not present

## 2020-10-22 DIAGNOSIS — Z87891 Personal history of nicotine dependence: Secondary | ICD-10-CM | POA: Insufficient documentation

## 2020-10-22 DIAGNOSIS — Z880 Allergy status to penicillin: Secondary | ICD-10-CM | POA: Diagnosis not present

## 2020-10-22 DIAGNOSIS — N840 Polyp of corpus uteri: Secondary | ICD-10-CM | POA: Insufficient documentation

## 2020-10-22 DIAGNOSIS — Z79899 Other long term (current) drug therapy: Secondary | ICD-10-CM | POA: Diagnosis not present

## 2020-10-22 DIAGNOSIS — E1140 Type 2 diabetes mellitus with diabetic neuropathy, unspecified: Secondary | ICD-10-CM | POA: Diagnosis not present

## 2020-10-22 DIAGNOSIS — R69 Illness, unspecified: Secondary | ICD-10-CM | POA: Diagnosis not present

## 2020-10-22 DIAGNOSIS — I10 Essential (primary) hypertension: Secondary | ICD-10-CM | POA: Diagnosis not present

## 2020-10-22 DIAGNOSIS — Z885 Allergy status to narcotic agent status: Secondary | ICD-10-CM | POA: Diagnosis not present

## 2020-10-22 HISTORY — PX: DILATATION & CURETTAGE/HYSTEROSCOPY WITH MYOSURE: SHX6511

## 2020-10-22 HISTORY — DX: Sickle-cell disease without crisis: D57.1

## 2020-10-22 HISTORY — DX: Schizophrenia, unspecified: F20.9

## 2020-10-22 HISTORY — DX: Personal history of urinary calculi: Z87.442

## 2020-10-22 HISTORY — DX: Unspecified osteoarthritis, unspecified site: M19.90

## 2020-10-22 LAB — COMPREHENSIVE METABOLIC PANEL
ALT: 12 U/L (ref 0–44)
AST: 11 U/L — ABNORMAL LOW (ref 15–41)
Albumin: 3.1 g/dL — ABNORMAL LOW (ref 3.5–5.0)
Alkaline Phosphatase: 78 U/L (ref 38–126)
Anion gap: 8 (ref 5–15)
BUN: 15 mg/dL (ref 6–20)
CO2: 27 mmol/L (ref 22–32)
Calcium: 8.6 mg/dL — ABNORMAL LOW (ref 8.9–10.3)
Chloride: 102 mmol/L (ref 98–111)
Creatinine, Ser: 0.93 mg/dL (ref 0.44–1.00)
GFR, Estimated: >60 mL/min (ref >60)
Glucose, Bld: 209 mg/dL — ABNORMAL HIGH (ref 70–99)
Potassium: 3.6 mmol/L (ref 3.5–5.1)
Sodium: 137 mmol/L (ref 135–145)
Total Bilirubin: 0.4 mg/dL (ref 0.3–1.2)
Total Protein: 6.9 g/dL (ref 6.5–8.1)

## 2020-10-22 LAB — GLUCOSE, CAPILLARY
Glucose-Capillary: 215 mg/dL — ABNORMAL HIGH (ref 70–99)
Glucose-Capillary: 195 mg/dL — ABNORMAL HIGH (ref 70–99)

## 2020-10-22 LAB — CBC
HCT: 26.3 % — ABNORMAL LOW (ref 36.0–46.0)
Hemoglobin: 7.5 g/dL — ABNORMAL LOW (ref 12.0–15.0)
MCH: 18.2 pg — ABNORMAL LOW (ref 26.0–34.0)
MCHC: 28.5 g/dL — ABNORMAL LOW (ref 30.0–36.0)
MCV: 63.8 fL — ABNORMAL LOW (ref 80.0–100.0)
Platelets: 484 10*3/uL — ABNORMAL HIGH (ref 150–400)
RBC: 4.12 MIL/uL (ref 3.87–5.11)
RDW: 20.7 % — ABNORMAL HIGH (ref 11.5–15.5)
WBC: 9.3 10*3/uL (ref 4.0–10.5)
nRBC: 0 % (ref 0.0–0.2)

## 2020-10-22 LAB — POCT PREGNANCY, URINE: Preg Test, Ur: NEGATIVE

## 2020-10-22 LAB — SAMPLE TO BLOOD BANK

## 2020-10-22 SURGERY — DILATATION & CURETTAGE/HYSTEROSCOPY WITH MYOSURE
Anesthesia: General | Site: Vagina

## 2020-10-22 MED ORDER — SUCCINYLCHOLINE CHLORIDE 200 MG/10ML IV SOSY
PREFILLED_SYRINGE | INTRAVENOUS | Status: DC | PRN
  Administered 2020-10-22: 200 mg via INTRAVENOUS

## 2020-10-22 MED ORDER — SODIUM CHLORIDE 0.9 % IR SOLN
Status: DC | PRN
  Administered 2020-10-22: 3000 mL

## 2020-10-22 MED ORDER — ONDANSETRON HCL 4 MG/2ML IJ SOLN
INTRAMUSCULAR | Status: DC | PRN
  Administered 2020-10-22: 4 mg via INTRAVENOUS

## 2020-10-22 MED ORDER — LIDOCAINE HCL 2 % IJ SOLN
INTRAMUSCULAR | Status: AC
  Filled 2020-10-22: qty 20

## 2020-10-22 MED ORDER — POVIDONE-IODINE 10 % EX SWAB: 2.0000 "application " | Freq: Once | CUTANEOUS | Status: DC

## 2020-10-22 MED ORDER — DEXAMETHASONE SODIUM PHOSPHATE 10 MG/ML IJ SOLN
INTRAMUSCULAR | Status: DC | PRN
  Administered 2020-10-22: 5 mg via INTRAVENOUS

## 2020-10-22 MED ORDER — PROPOFOL 10 MG/ML IV BOLUS
INTRAVENOUS | Status: DC | PRN
  Administered 2020-10-22: 50 mg via INTRAVENOUS
  Administered 2020-10-22: 200 mg via INTRAVENOUS

## 2020-10-22 MED ORDER — SUGAMMADEX SODIUM 500 MG/5ML IV SOLN
INTRAVENOUS | Status: AC
  Filled 2020-10-22: qty 5

## 2020-10-22 MED ORDER — CHLORHEXIDINE GLUCONATE 0.12 % MT SOLN
15.0000 mL | Freq: Once | OROMUCOSAL | Status: AC
  Administered 2020-10-22: 15 mL via OROMUCOSAL
  Filled 2020-10-22: qty 15

## 2020-10-22 MED ORDER — LIDOCAINE HCL (PF) 2 % IJ SOLN
INTRAMUSCULAR | Status: AC
  Filled 2020-10-22: qty 5

## 2020-10-22 MED ORDER — LIDOCAINE 2% (20 MG/ML) 5 ML SYRINGE
INTRAMUSCULAR | Status: DC | PRN
  Administered 2020-10-22: 100 mg via INTRAVENOUS

## 2020-10-22 MED ORDER — MIDAZOLAM HCL 2 MG/2ML IJ SOLN
INTRAMUSCULAR | Status: DC | PRN
  Administered 2020-10-22: 2 mg via INTRAVENOUS

## 2020-10-22 MED ORDER — SUCCINYLCHOLINE CHLORIDE 200 MG/10ML IV SOSY
PREFILLED_SYRINGE | INTRAVENOUS | Status: AC
  Filled 2020-10-22: qty 10

## 2020-10-22 MED ORDER — FENTANYL CITRATE (PF) 250 MCG/5ML IJ SOLN
INTRAMUSCULAR | Status: AC
  Filled 2020-10-22: qty 5

## 2020-10-22 MED ORDER — ROCURONIUM BROMIDE 10 MG/ML (PF) SYRINGE
PREFILLED_SYRINGE | INTRAVENOUS | Status: DC | PRN
  Administered 2020-10-22: 40 mg via INTRAVENOUS
  Administered 2020-10-22: 5 mg via INTRAVENOUS

## 2020-10-22 MED ORDER — ROCURONIUM BROMIDE 10 MG/ML (PF) SYRINGE
PREFILLED_SYRINGE | INTRAVENOUS | Status: AC
  Filled 2020-10-22: qty 10

## 2020-10-22 MED ORDER — LIDOCAINE HCL 2 % IJ SOLN
INTRAMUSCULAR | Status: DC | PRN
  Administered 2020-10-22: 16 mL

## 2020-10-22 MED ORDER — SUGAMMADEX SODIUM 200 MG/2ML IV SOLN
INTRAVENOUS | Status: DC | PRN
  Administered 2020-10-22: 100 mg via INTRAVENOUS
  Administered 2020-10-22: 200 mg via INTRAVENOUS
  Administered 2020-10-22: 100 mg via INTRAVENOUS

## 2020-10-22 MED ORDER — MIDAZOLAM HCL 2 MG/2ML IJ SOLN
INTRAMUSCULAR | Status: AC
  Filled 2020-10-22: qty 2

## 2020-10-22 MED ORDER — ACETAMINOPHEN 500 MG PO TABS
1000.0000 mg | ORAL_TABLET | Freq: Once | ORAL | Status: AC
  Administered 2020-10-22: 1000 mg via ORAL
  Filled 2020-10-22: qty 2

## 2020-10-22 MED ORDER — FENTANYL CITRATE (PF) 250 MCG/5ML IJ SOLN
INTRAMUSCULAR | Status: DC | PRN
  Administered 2020-10-22: 50 ug via INTRAVENOUS
  Administered 2020-10-22: 100 ug via INTRAVENOUS

## 2020-10-22 MED ORDER — OXYCODONE HCL 5 MG PO TABS
ORAL_TABLET | ORAL | Status: AC
  Filled 2020-10-22: qty 6

## 2020-10-22 MED ORDER — FENTANYL CITRATE (PF) 100 MCG/2ML IJ SOLN
25.0000 ug | INTRAMUSCULAR | Status: DC | PRN
  Administered 2020-10-22: 50 ug via INTRAVENOUS
  Administered 2020-10-22: 100 ug via INTRAVENOUS

## 2020-10-22 MED ORDER — LACTATED RINGERS IV SOLN: INTRAVENOUS | Status: DC

## 2020-10-22 MED ORDER — PROPOFOL 10 MG/ML IV BOLUS
INTRAVENOUS | Status: AC
  Filled 2020-10-22: qty 40

## 2020-10-22 MED ORDER — FENTANYL CITRATE (PF) 100 MCG/2ML IJ SOLN
INTRAMUSCULAR | Status: AC
  Filled 2020-10-22: qty 2

## 2020-10-22 MED ORDER — DEXAMETHASONE SODIUM PHOSPHATE 10 MG/ML IJ SOLN
INTRAMUSCULAR | Status: AC
  Filled 2020-10-22: qty 1

## 2020-10-22 MED ORDER — OXYCODONE HCL 5 MG PO TABS: 30.0000 mg | ORAL_TABLET | Freq: Once | ORAL | Status: DC

## 2020-10-22 MED ORDER — OXYCODONE HCL 5 MG PO TABS
30.0000 mg | ORAL_TABLET | Freq: Once | ORAL | Status: AC
  Administered 2020-10-22: 30 mg via ORAL

## 2020-10-22 MED ORDER — ORAL CARE MOUTH RINSE: 15.0000 mL | Freq: Once | OROMUCOSAL | Status: AC

## 2020-10-22 MED ORDER — ONDANSETRON HCL 4 MG/2ML IJ SOLN
INTRAMUSCULAR | Status: AC
  Filled 2020-10-22: qty 2

## 2020-10-22 SURGICAL SUPPLY — 15 items
CATH ROBINSON RED A/P 16FR (CATHETERS) ×2 IMPLANT
DEVICE MYOSURE LITE (MISCELLANEOUS) ×2 IMPLANT
DEVICE MYOSURE REACH (MISCELLANEOUS) IMPLANT
GLOVE BIOGEL PI IND STRL 7.0 (GLOVE) ×1 IMPLANT
GLOVE BIOGEL PI INDICATOR 7.0 (GLOVE) ×1
GLOVE ORTHO TXT STRL SZ7.5 (GLOVE) ×4 IMPLANT
GOWN STRL REUS W/ TWL LRG LVL3 (GOWN DISPOSABLE) ×2 IMPLANT
GOWN STRL REUS W/TWL LRG LVL3 (GOWN DISPOSABLE) ×2
KIT PROCEDURE FLUENT (KITS) ×2 IMPLANT
KIT TURNOVER KIT B (KITS) ×2 IMPLANT
PACK VAGINAL MINOR WOMEN LF (CUSTOM PROCEDURE TRAY) ×2 IMPLANT
PAD OB MATERNITY 4.3X12.25 (PERSONAL CARE ITEMS) ×2 IMPLANT
SEAL ROD LENS SCOPE MYOSURE (ABLATOR) ×2 IMPLANT
TOWEL GREEN STERILE FF (TOWEL DISPOSABLE) ×4 IMPLANT
UNDERPAD 30X36 HEAVY ABSORB (UNDERPADS AND DIAPERS) ×2 IMPLANT

## 2020-10-22 NOTE — Discharge Instructions (Signed)
Routine instructions for hysteroscopy 

## 2020-10-22 NOTE — Transfer of Care (Signed)
Immediate Anesthesia Transfer of Care Note  Patient: Cynthia Rosales  Procedure(s) Performed: DILATATION & CURETTAGE/HYSTEROSCOPY WITH MYOSURE (N/A Vagina )  Patient Location: PACU  Anesthesia Type:General  Level of Consciousness: awake, alert , oriented and drowsy  Airway & Oxygen Therapy: Patient Spontanous Breathing and Patient connected to face mask oxygen  Post-op Assessment: Report given to RN and Post -op Vital signs reviewed and stable  Post vital signs: Reviewed and stable  Last Vitals:  Vitals Value Taken Time  BP 135/87 10/22/20 0842  Temp 36.6 C 10/22/20 0842  Pulse 92 10/22/20 0843  Resp 15 10/22/20 0843  SpO2 100 % 10/22/20 0843  Vitals shown include unvalidated device data.  Last Pain:  Vitals:   10/22/20 0655  TempSrc:   PainSc: 10-Worst pain ever      Patients Stated Pain Goal: 3 (79/39/03 0092)  Complications: No complications documented.

## 2020-10-22 NOTE — Op Note (Signed)
Preoperative diagnosis: Abnormal uterine bleeding Postoperative diagnosis: Same Procedure: Hysteroscopy with Myosure resection of polyp, D&C Surgeon: Cheri Fowler M.D. Anesthesia: Gen, paracervical block Findings: She had a normal endometrial cavity with a small fundal endometrial polyp Estimated blood loss: 50cc Fluid deficit: Through the hysteroscope fluid deficit was about 150 cc Specimens: Endometrial resection sent for routine pathology Complications: None  Procedure in detail: The patient was taken to the operating room and placed in the dorsosupine position. General anesthesia was induced. She was placed in mobile stirrups and legs were elevated. Perineum and vagina were prepped and draped in the usual sterile fashion and bladder drained with a red Robinson catheter. A speculum was inserted in the vagina and the anterior lip of the cervix was grasped with a single-tooth tenaculum. Paracervical block was performed with a total of 16 cc of 2% plain lidocaine. The uterus then sounded to 12 cm. The cervix was gradually dilated to a size 19 dilator without difficulty. The Myosure hysteroscope was inserted and good visualization was achieved.  The cervix was long and the cavity was deep. The endometrial cavity was normal except for a small polyp.  The Myosure Lite was inserted and the polyp was completely resected, as well as some small pieces of endometrial tissue.  The endometrial cavity was now normal, no other visible lesions.  The hysteroscope was removed. Curettage was performed with removal of minimal tissue. The single-tooth tenaculum was removed from the cervix and bleeding was controlled with pressure. All instruments were then removed from the vagina. The patient tolerated the procedure well and was taken to the recovery in stable condition. Counts were correct and she had PAS hose on throughout the procedure.

## 2020-10-22 NOTE — Anesthesia Procedure Notes (Signed)
Procedure Name: Intubation Date/Time: 10/22/2020 7:46 AM Performed by: Rande Brunt, CRNA Pre-anesthesia Checklist: Patient identified, Emergency Drugs available, Suction available and Patient being monitored Patient Re-evaluated:Patient Re-evaluated prior to induction Oxygen Delivery Method: Circle System Utilized Preoxygenation: Pre-oxygenation with 100% oxygen Induction Type: IV induction Ventilation: Mask ventilation without difficulty Laryngoscope Size: Mac and 4 Grade View: Grade II Tube type: Oral Tube size: 7.0 mm Number of attempts: 1 Airway Equipment and Method: Stylet and Oral airway Placement Confirmation: ETT inserted through vocal cords under direct vision,  positive ETCO2 and breath sounds checked- equal and bilateral Secured at: 22 cm Tube secured with: Tape Dental Injury: Teeth and Oropharynx as per pre-operative assessment

## 2020-10-22 NOTE — Interval H&P Note (Signed)
History and Physical Interval Note:  10/22/2020 7:19 AM  Cynthia Rosales  has presented today for surgery, with the diagnosis of abnormal uterine bleeding.  The various methods of treatment have been discussed with the patient and family. After consideration of risks, benefits and other options for treatment, the patient has consented to  Procedure(s): [place] (N/A) as a surgical intervention.  The patient's history has been reviewed, patient examined, no change in status, stable for surgery.  I have reviewed the patient's chart and labs.  Questions were answered to the patient's satisfaction.     Blane Ohara Naylene Foell

## 2020-10-23 ENCOUNTER — Encounter (HOSPITAL_COMMUNITY): Payer: Self-pay | Admitting: Obstetrics and Gynecology

## 2020-10-23 DIAGNOSIS — E1140 Type 2 diabetes mellitus with diabetic neuropathy, unspecified: Secondary | ICD-10-CM | POA: Diagnosis not present

## 2020-10-23 DIAGNOSIS — E119 Type 2 diabetes mellitus without complications: Secondary | ICD-10-CM | POA: Diagnosis not present

## 2020-10-23 DIAGNOSIS — I10 Essential (primary) hypertension: Secondary | ICD-10-CM | POA: Diagnosis not present

## 2020-10-23 LAB — SURGICAL PATHOLOGY

## 2020-10-23 NOTE — Anesthesia Postprocedure Evaluation (Signed)
Anesthesia Post Note  Patient: Cynthia Rosales  Procedure(s) Performed: DILATATION & CURETTAGE/HYSTEROSCOPY WITH MYOSURE (N/A Vagina )     Patient location during evaluation: PACU Anesthesia Type: General Level of consciousness: awake and alert Pain management: pain level controlled Vital Signs Assessment: post-procedure vital signs reviewed and stable Respiratory status: spontaneous breathing, nonlabored ventilation, respiratory function stable and patient connected to nasal cannula oxygen Cardiovascular status: blood pressure returned to baseline and stable Postop Assessment: no apparent nausea or vomiting Anesthetic complications: no   No complications documented.  Last Vitals:  Vitals:   10/22/20 0845 10/22/20 0907  BP: 137/85 133/78  Pulse:  89  Resp:  18  Temp:    SpO2:  99%    Last Pain:  Vitals:   10/22/20 0907  TempSrc:   PainSc: Asleep                 Raffaele Derise L Zanita Millman

## 2020-10-24 DIAGNOSIS — E1140 Type 2 diabetes mellitus with diabetic neuropathy, unspecified: Secondary | ICD-10-CM | POA: Diagnosis not present

## 2020-10-24 DIAGNOSIS — E119 Type 2 diabetes mellitus without complications: Secondary | ICD-10-CM | POA: Diagnosis not present

## 2020-10-24 DIAGNOSIS — I10 Essential (primary) hypertension: Secondary | ICD-10-CM | POA: Diagnosis not present

## 2020-10-25 DIAGNOSIS — E1140 Type 2 diabetes mellitus with diabetic neuropathy, unspecified: Secondary | ICD-10-CM | POA: Diagnosis not present

## 2020-10-25 DIAGNOSIS — E119 Type 2 diabetes mellitus without complications: Secondary | ICD-10-CM | POA: Diagnosis not present

## 2020-10-25 DIAGNOSIS — I10 Essential (primary) hypertension: Secondary | ICD-10-CM | POA: Diagnosis not present

## 2020-11-15 DIAGNOSIS — I10 Essential (primary) hypertension: Secondary | ICD-10-CM | POA: Diagnosis not present

## 2020-11-15 DIAGNOSIS — E119 Type 2 diabetes mellitus without complications: Secondary | ICD-10-CM | POA: Diagnosis not present

## 2020-11-15 DIAGNOSIS — E1140 Type 2 diabetes mellitus with diabetic neuropathy, unspecified: Secondary | ICD-10-CM | POA: Diagnosis not present

## 2020-11-16 DIAGNOSIS — I10 Essential (primary) hypertension: Secondary | ICD-10-CM | POA: Diagnosis not present

## 2020-11-16 DIAGNOSIS — E1140 Type 2 diabetes mellitus with diabetic neuropathy, unspecified: Secondary | ICD-10-CM | POA: Diagnosis not present

## 2020-11-16 DIAGNOSIS — E119 Type 2 diabetes mellitus without complications: Secondary | ICD-10-CM | POA: Diagnosis not present

## 2020-11-17 DIAGNOSIS — I10 Essential (primary) hypertension: Secondary | ICD-10-CM | POA: Diagnosis not present

## 2020-11-17 DIAGNOSIS — E119 Type 2 diabetes mellitus without complications: Secondary | ICD-10-CM | POA: Diagnosis not present

## 2020-11-17 DIAGNOSIS — E1140 Type 2 diabetes mellitus with diabetic neuropathy, unspecified: Secondary | ICD-10-CM | POA: Diagnosis not present

## 2020-11-18 DIAGNOSIS — E1140 Type 2 diabetes mellitus with diabetic neuropathy, unspecified: Secondary | ICD-10-CM | POA: Diagnosis not present

## 2020-11-18 DIAGNOSIS — I10 Essential (primary) hypertension: Secondary | ICD-10-CM | POA: Diagnosis not present

## 2020-11-18 DIAGNOSIS — E119 Type 2 diabetes mellitus without complications: Secondary | ICD-10-CM | POA: Diagnosis not present

## 2020-11-19 DIAGNOSIS — E1140 Type 2 diabetes mellitus with diabetic neuropathy, unspecified: Secondary | ICD-10-CM | POA: Diagnosis not present

## 2020-11-19 DIAGNOSIS — E119 Type 2 diabetes mellitus without complications: Secondary | ICD-10-CM | POA: Diagnosis not present

## 2020-11-19 DIAGNOSIS — I10 Essential (primary) hypertension: Secondary | ICD-10-CM | POA: Diagnosis not present

## 2020-11-20 DIAGNOSIS — E1140 Type 2 diabetes mellitus with diabetic neuropathy, unspecified: Secondary | ICD-10-CM | POA: Diagnosis not present

## 2020-11-20 DIAGNOSIS — E119 Type 2 diabetes mellitus without complications: Secondary | ICD-10-CM | POA: Diagnosis not present

## 2020-11-20 DIAGNOSIS — I10 Essential (primary) hypertension: Secondary | ICD-10-CM | POA: Diagnosis not present

## 2020-11-21 DIAGNOSIS — I10 Essential (primary) hypertension: Secondary | ICD-10-CM | POA: Diagnosis not present

## 2020-11-21 DIAGNOSIS — E1140 Type 2 diabetes mellitus with diabetic neuropathy, unspecified: Secondary | ICD-10-CM | POA: Diagnosis not present

## 2020-11-21 DIAGNOSIS — E119 Type 2 diabetes mellitus without complications: Secondary | ICD-10-CM | POA: Diagnosis not present

## 2020-11-22 DIAGNOSIS — E119 Type 2 diabetes mellitus without complications: Secondary | ICD-10-CM | POA: Diagnosis not present

## 2020-11-22 DIAGNOSIS — I10 Essential (primary) hypertension: Secondary | ICD-10-CM | POA: Diagnosis not present

## 2020-11-22 DIAGNOSIS — E1140 Type 2 diabetes mellitus with diabetic neuropathy, unspecified: Secondary | ICD-10-CM | POA: Diagnosis not present

## 2020-11-23 DIAGNOSIS — E119 Type 2 diabetes mellitus without complications: Secondary | ICD-10-CM | POA: Diagnosis not present

## 2020-11-23 DIAGNOSIS — I10 Essential (primary) hypertension: Secondary | ICD-10-CM | POA: Diagnosis not present

## 2020-11-23 DIAGNOSIS — E1140 Type 2 diabetes mellitus with diabetic neuropathy, unspecified: Secondary | ICD-10-CM | POA: Diagnosis not present

## 2020-11-24 DIAGNOSIS — E119 Type 2 diabetes mellitus without complications: Secondary | ICD-10-CM | POA: Diagnosis not present

## 2020-11-24 DIAGNOSIS — E1140 Type 2 diabetes mellitus with diabetic neuropathy, unspecified: Secondary | ICD-10-CM | POA: Diagnosis not present

## 2020-11-24 DIAGNOSIS — I10 Essential (primary) hypertension: Secondary | ICD-10-CM | POA: Diagnosis not present

## 2020-11-25 DIAGNOSIS — E1140 Type 2 diabetes mellitus with diabetic neuropathy, unspecified: Secondary | ICD-10-CM | POA: Diagnosis not present

## 2020-11-25 DIAGNOSIS — E119 Type 2 diabetes mellitus without complications: Secondary | ICD-10-CM | POA: Diagnosis not present

## 2020-11-25 DIAGNOSIS — I10 Essential (primary) hypertension: Secondary | ICD-10-CM | POA: Diagnosis not present

## 2020-11-26 DIAGNOSIS — E1140 Type 2 diabetes mellitus with diabetic neuropathy, unspecified: Secondary | ICD-10-CM | POA: Diagnosis not present

## 2020-11-26 DIAGNOSIS — E119 Type 2 diabetes mellitus without complications: Secondary | ICD-10-CM | POA: Diagnosis not present

## 2020-11-26 DIAGNOSIS — I10 Essential (primary) hypertension: Secondary | ICD-10-CM | POA: Diagnosis not present

## 2020-11-26 NOTE — Progress Notes (Signed)
[id number] [place], [place] - [address] [place] [address] Phone: [phone] Fax: [phone]      Your procedure is scheduled on January 4  Report to [facility] Main Entrance "A" at 0530 A.M., and check in at the Admitting office.  Call this number if you have problems the morning of surgery:  [phone]  Call [phone] if you have any questions prior to your surgery date Monday-Friday 8am-4pm    Remember:  Do not eat after midnight the night before your surgery  You may drink clear liquids until 0430 am the morning of your surgery.   Clear liquids allowed are: Water, Non-Citrus Juices (without pulp), Carbonated Beverages, Clear Tea, Black Coffee Only, and Gatorade    Take these medicines the morning of surgery with A SIP OF WATER  diazepam (VALIUM)  If needed oxycodone (ROXICODONE) if needed for pain  As of today, STOP taking any Aspirin (unless otherwise instructed by your surgeon) Aleve, Naproxen, Ibuprofen, Motrin, Advil, Goody's, BC's, all herbal medications, fish oil, and all vitamins.   WHAT DO I DO ABOUT MY DIABETES MEDICATION?   Marland Kitchen Do not take oral diabetes medicines (pills) the morning of surgery. sitaGLIPtin (JANUVIA)  . THE NIGHT BEFORE SURGERY, take ____18_______ units of _NOVOLOG MIX 70/30 FLEXPEN (70-30) __________insulin.       . THE MORNING OF SURGERY, take _____18________ units of __NOVOLOG MIX 70/30 FLEXPEN (70-30) ________insulin.  . The day of surgery, do not take other diabetes injectables, including Byetta (exenatide), Bydureon (exenatide ER), Victoza (liraglutide), or Trulicity (dulaglutide).  . If your CBG is greater than 220 mg/dL, you may take  of your sliding scale (correction) dose of insulin.   HOW TO MANAGE YOUR DIABETES BEFORE AND AFTER SURGERY  Why is it important to control my blood sugar before and after surgery? . Improving blood sugar levels before and after surgery helps healing and  can limit problems. . A way of improving blood sugar control is eating a healthy diet by: o  Eating less sugar and carbohydrates o  Increasing activity/exercise o  Talking with your doctor about reaching your blood sugar goals . High blood sugars (greater than 180 mg/dL) can raise your risk of infections and slow your recovery, so you will need to focus on controlling your diabetes during the weeks before surgery. . Make sure that the doctor who takes care of your diabetes knows about your planned surgery including the date and location.  How do I manage my blood sugar before surgery? . Check your blood sugar at least 4 times a day, starting 2 days before surgery, to make sure that the level is not too high or low. . Check your blood sugar the morning of your surgery when you wake up and every 2 hours until you get to the Short Stay unit. o If your blood sugar is less than 70 mg/dL, you will need to treat for low blood sugar: - Do not take insulin. - Treat a low blood sugar (less than 70 mg/dL) with  cup of clear juice (cranberry or apple), 4 glucose tablets, OR glucose gel. - Recheck blood sugar in 15 minutes after treatment (to make sure it is greater than 70 mg/dL). If your blood sugar is not greater than 70 mg/dL on recheck, call [phone] for further instructions. . Report your blood sugar to the short stay nurse when you get to Short Stay.  . If you are admitted to the hospital after surgery: o  Your blood sugar will be checked by the staff and you will probably be given insulin after surgery (instead of oral diabetes medicines) to make sure you have good blood sugar levels. o The goal for blood sugar control after surgery is 80-180 mg/dL.                      Do not wear jewelry, make up, or nail polish            Do not wear lotions, powders, perfumes, or deodorant.            Do not shave 48 hours prior to surgery.             Do not bring valuables to the hospital.             [facility] is not responsible for any belongings or valuables.  Do NOT Smoke (Tobacco/Vaping) or drink Alcohol 24 hours prior to your procedure If you use a CPAP at night, you may bring all equipment for your overnight stay.   Contacts, glasses, dentures or bridgework may not be worn into surgery.      For patients admitted to the hospital, discharge time will be determined by your treatment team.   Patients discharged the day of surgery will not be allowed to drive home, and someone needs to stay with them for 24 hours.    Special instructions:   [facility]- Preparing For Surgery  Before surgery, you can play an important role. Because skin is not sterile, your skin needs to be as free of germs as possible. You can reduce the number of germs on your skin by washing with CHG (chlorahexidine gluconate) Soap before surgery.  CHG is an antiseptic cleaner which kills germs and bonds with the skin to continue killing germs even after washing.    Oral Hygiene is also important to reduce your risk of infection.  Remember - BRUSH YOUR TEETH THE MORNING OF SURGERY WITH YOUR REGULAR TOOTHPASTE  Please do not use if you have an allergy to CHG or antibacterial soaps. If your skin becomes reddened/irritated stop using the CHG.  Do not shave (including legs and underarms) for at least 48 hours prior to first CHG shower. It is OK to shave your face.  Please follow these instructions carefully.   1. Shower the NIGHT BEFORE SURGERY and the MORNING OF SURGERY with CHG Soap.   2. If you chose to wash your hair, wash your hair first as usual with your normal shampoo.  3. After you shampoo, rinse your hair and body thoroughly to remove the shampoo.  4. Use CHG as you would any other liquid soap. You can apply CHG directly to the skin and wash gently with a scrungie or a clean washcloth.   5. Apply the CHG Soap to your body ONLY FROM THE NECK DOWN.  Do not use on open wounds or open sores. Avoid contact  with your eyes, ears, mouth and genitals (private parts). Wash Face and genitals (private parts)  with your normal soap.   6. Wash thoroughly, paying special attention to the area where your surgery will be performed.  7. Thoroughly rinse your body with warm water from the neck down.  8. DO NOT shower/wash with your normal soap after using and rinsing off the CHG Soap.  9. Pat yourself dry with a CLEAN TOWEL.  10. Wear CLEAN PAJAMAS to bed the night before surgery  11. Place CLEAN  SHEETS on your bed the night of your first shower and DO NOT SLEEP WITH PETS.   Day of Surgery: Wear Clean/Comfortable clothing the morning of surgery Do not apply any deodorants/lotions.   Remember to brush your teeth WITH YOUR REGULAR TOOTHPASTE.   Please read over the following fact sheets that you were given.

## 2020-11-27 ENCOUNTER — Inpatient Hospital Stay (HOSPITAL_COMMUNITY)
Admission: RE | Admit: 2020-11-27 | Discharge: 2020-11-27 | Disposition: A | Discharge status: Home / Self Care | Payer: Medicare HMO | Source: Ambulatory Visit

## 2020-11-27 DIAGNOSIS — E119 Type 2 diabetes mellitus without complications: Secondary | ICD-10-CM | POA: Diagnosis not present

## 2020-11-27 DIAGNOSIS — Z01812 Encounter for preprocedural laboratory examination: Secondary | ICD-10-CM | POA: Diagnosis not present

## 2020-11-27 DIAGNOSIS — I10 Essential (primary) hypertension: Secondary | ICD-10-CM | POA: Diagnosis not present

## 2020-11-27 DIAGNOSIS — E1140 Type 2 diabetes mellitus with diabetic neuropathy, unspecified: Secondary | ICD-10-CM | POA: Diagnosis not present

## 2020-11-27 DIAGNOSIS — Z0189 Encounter for other specified special examinations: Secondary | ICD-10-CM | POA: Diagnosis not present

## 2020-11-27 NOTE — Progress Notes (Signed)
Called patient to find out if still coming to her appt.  No answer, left a message.

## 2020-11-28 DIAGNOSIS — I10 Essential (primary) hypertension: Secondary | ICD-10-CM | POA: Diagnosis not present

## 2020-11-28 DIAGNOSIS — E1140 Type 2 diabetes mellitus with diabetic neuropathy, unspecified: Secondary | ICD-10-CM | POA: Diagnosis not present

## 2020-11-28 DIAGNOSIS — E119 Type 2 diabetes mellitus without complications: Secondary | ICD-10-CM | POA: Diagnosis not present

## 2020-11-29 DIAGNOSIS — I10 Essential (primary) hypertension: Secondary | ICD-10-CM | POA: Diagnosis not present

## 2020-11-29 DIAGNOSIS — E119 Type 2 diabetes mellitus without complications: Secondary | ICD-10-CM | POA: Diagnosis not present

## 2020-11-29 DIAGNOSIS — E1140 Type 2 diabetes mellitus with diabetic neuropathy, unspecified: Secondary | ICD-10-CM | POA: Diagnosis not present

## 2020-11-30 DIAGNOSIS — E1140 Type 2 diabetes mellitus with diabetic neuropathy, unspecified: Secondary | ICD-10-CM | POA: Diagnosis not present

## 2020-11-30 DIAGNOSIS — I10 Essential (primary) hypertension: Secondary | ICD-10-CM | POA: Diagnosis not present

## 2020-11-30 DIAGNOSIS — E119 Type 2 diabetes mellitus without complications: Secondary | ICD-10-CM | POA: Diagnosis not present

## 2020-12-01 DIAGNOSIS — E1140 Type 2 diabetes mellitus with diabetic neuropathy, unspecified: Secondary | ICD-10-CM | POA: Diagnosis not present

## 2020-12-01 DIAGNOSIS — I10 Essential (primary) hypertension: Secondary | ICD-10-CM | POA: Diagnosis not present

## 2020-12-01 DIAGNOSIS — E119 Type 2 diabetes mellitus without complications: Secondary | ICD-10-CM | POA: Diagnosis not present

## 2020-12-02 ENCOUNTER — Encounter (HOSPITAL_COMMUNITY): Payer: Self-pay | Admitting: Obstetrics and Gynecology

## 2020-12-02 ENCOUNTER — Other Ambulatory Visit (HOSPITAL_COMMUNITY)
Admission: RE | Admit: 2020-12-02 | Discharge: 2020-12-02 | Disposition: A | Discharge status: Home / Self Care | Payer: Medicare HMO | Source: Ambulatory Visit | Attending: Obstetrics and Gynecology | Admitting: Obstetrics and Gynecology

## 2020-12-02 DIAGNOSIS — Z01812 Encounter for preprocedural laboratory examination: Secondary | ICD-10-CM | POA: Insufficient documentation

## 2020-12-02 DIAGNOSIS — Z20822 Contact with and (suspected) exposure to covid-19: Secondary | ICD-10-CM | POA: Insufficient documentation

## 2020-12-02 DIAGNOSIS — I10 Essential (primary) hypertension: Secondary | ICD-10-CM | POA: Diagnosis not present

## 2020-12-02 DIAGNOSIS — E119 Type 2 diabetes mellitus without complications: Secondary | ICD-10-CM | POA: Diagnosis not present

## 2020-12-02 DIAGNOSIS — E1140 Type 2 diabetes mellitus with diabetic neuropathy, unspecified: Secondary | ICD-10-CM | POA: Diagnosis not present

## 2020-12-02 MED ORDER — DEXTROSE 5 % IV SOLN
3.0000 g | INTRAVENOUS | Status: DC
  Filled 2020-12-02: qty 3000

## 2020-12-02 NOTE — Anesthesia Preprocedure Evaluation (Addendum)
Anesthesia Evaluation  Patient identified by MRN, date of birth, ID band Patient awake    Reviewed: Allergy & Precautions, NPO status , Patient's Chart, lab work & pertinent test results  History of Anesthesia Complications Negative for: history of anesthetic complications  Airway Mallampati: II  TM Distance: >3 FB Neck ROM: Full    Dental  (+) Dental Advisory Given   Pulmonary former smoker,  12/02/2020 SARS coronavirus NEG   breath sounds clear to auscultation       Cardiovascular hypertension, Pt. on medications (-) angina Rhythm:Regular Rate:Normal     Neuro/Psych  Headaches, Anxiety Depression Schizophrenia    GI/Hepatic negative GI ROS, Neg liver ROS,   Endo/Other  diabetes, Insulin DependentMorbid obesity  Renal/GU negative Renal ROS     Musculoskeletal  (+) Arthritis ,   Abdominal (+) + obese,   Peds  Hematology  (+) Blood dyscrasia (Hb 8.5), Sickle cell anemia and anemia , Narcotic dependent sickle cell anemia   Anesthesia Other Findings   Reproductive/Obstetrics                            Anesthesia Physical Anesthesia Plan  ASA: III  Anesthesia Plan: General   Post-op Pain Management:    Induction: Intravenous  PONV Risk Score and Plan: 3 and Ondansetron, Dexamethasone and Scopolamine patch - Pre-op  Airway Management Planned: Oral ETT  Additional Equipment: None  Intra-op Plan:   Post-operative Plan: Extubation in OR  Informed Consent: I have reviewed the patients History and Physical, chart, labs and discussed the procedure including the risks, benefits and alternatives for the proposed anesthesia with the patient or authorized representative who has indicated his/her understanding and acceptance.     Dental advisory given  Plan Discussed with: CRNA and Surgeon  Anesthesia Plan Comments:        Anesthesia Quick Evaluation

## 2020-12-02 NOTE — Progress Notes (Signed)
PCP:  Larena Sox, MD Cardiologist:  Denies  EKG:  10/22/20 CXR:  N/A ECHO:  Denies Stress Test:  Denies Cardiac Cath:  Denies  Fasting Blood Sugar-  N/A Checks Blood Sugar__N/A_ times a day  OSA/CPAP:  No  ASA/Blood Thinners:  No  Covid test 12/03/19  Anesthesia Review:  No  Patient denies shortness of breath, fever, cough, and chest pain at PAT appointment.  Patient verbalized understanding of instructions provided today at the PAT appointment.  Patient asked to review instructions at home and day of surgery.

## 2020-12-02 NOTE — H&P (Signed)
Cynthia Rosales is an 40 y.o. female. She was seen as a new patient by our NP in June.  She has long hx heavy menses, past 4 months is bleeding q 2-4 wks x5d, heavy x2-3d with cramps and clots.  Pelvic u/s from October with 5.8 and 10 cm myomas.  We were unable to get endometrial sampling in the office, so on 11-23 had hysteroscopy, D&C, myosure resection of benign endometrial polyp and benign endometrium.  She wants definitive treatment for her symptomatic myomas.  Pertinent Gynecological History: Last pap: normal Date: January 2020 OB History: G5, P2123 LTCS x 3   Menstrual History: No LMP recorded. (Menstrual status: Irregular Periods).    Past Medical History:  Diagnosis Date  . Abscess of bursa, left elbow 06/2013   Treated with I and D/antibiotics.   . Anemia   . Arthritis   . Asthma   . Depression   . History of blood transfusion    "after I had one of my kids"  . History of kidney stones   . Hypertension   . Ovarian cyst   . Schizophrenia ([place])   . Sickle cell anemia ([facility])   . Type II diabetes mellitus ([place])     Past Surgical History:  Procedure Laterality Date  . APPENDECTOMY  2013  . BILIARY STENT PLACEMENT N/A 05/02/2018   Procedure: BILIARY STENT PLACEMENT;  Surgeon: Doran Stabler, MD;  Location: [facility];  Service: Gastroenterology;  Laterality: N/A;  . CARPAL TUNNEL RELEASE    . CESAREAN SECTION  2000; 2007; 2011  . CHOLECYSTECTOMY N/A 04/03/2018   Procedure: LAPAROSCOPIC CHOLECYSTECTOMY;  Surgeon: Rolm Bookbinder, MD;  Location: [facility];  Service: General;  Laterality: N/A;  . DILATATION & CURETTAGE/HYSTEROSCOPY WITH MYOSURE N/A 10/22/2020   Procedure: [place];  Surgeon: Cheri Fowler, MD;  Location: [facility];  Service: Gynecology;  Laterality: N/A;  . DILATION AND CURETTAGE OF UTERUS    . ERCP N/A 05/02/2018   Procedure: ENDOSCOPIC RETROGRADE CHOLANGIOPANCREATOGRAPHY (ERCP);  Surgeon: Doran Stabler, MD;   Location: [facility];  Service: Gastroenterology;  Laterality: N/A;  . ERCP N/A 07/26/2018   Procedure: ENDOSCOPIC RETROGRADE CHOLANGIOPANCREATOGRAPHY (ERCP);  Surgeon: Irene Shipper, MD;  Location: [facility];  Service: Endoscopy;  Laterality: N/A;  stent removal  . IR RADIOLOGIST EVAL & MGMT  05/19/2018  . LAMINECTOMY N/A 12/13/2014   Procedure: CERVICAL LAMINECTOMY FOR INTRADURAL TUMOR;  Surgeon: Charlie Pitter, MD;  Location: [facility];  Service: Neurosurgery;  Laterality: N/A;  posterior  . LAPAROSCOPY N/A 07/20/2017   Procedure: LAPAROSCOPY OPERATIVE WITH WEDGE RESECTION RIGHT CORNUA AND PARTIAL SALPINGECTOMY;  Surgeon: [name], MD;  Location: [facility];  Service: Gynecology;  Laterality: N/A;  . REDUCTION MAMMAPLASTY Bilateral 1998  . REMOVAL OF STONES  05/02/2018   Procedure: REMOVAL OF STONES;  Surgeon: Doran Stabler, MD;  Location: [facility];  Service: Gastroenterology;;  . [name]  05/02/2018   Procedure: SPHINCTEROTOMY;  Surgeon: Doran Stabler, MD;  Location: [facility];  Service: Gastroenterology;;  . Lavell Islam REMOVAL  07/26/2018   Procedure: STENT REMOVAL;  Surgeon: Irene Shipper, MD;  Location: [facility];  Service: Endoscopy;;    Family History  Problem Relation Age of Onset  . Diabetes Mother   . Hypertension Mother   . Breast cancer Maternal Aunt   . Ovarian cancer Maternal Grandmother   . Breast cancer Maternal Aunt   . Migraines Neg Hx  Social History:  reports that she has quit smoking. She quit after 0.00 years of use. She has never used smokeless tobacco. She reports that she does not drink alcohol and does not use drugs.  Allergies:  Allergies  Allergen Reactions  . Amoxicillin Hives, Rash and Other (See Comments)    PATIENT HAS HAD A PCN REACTION WITH IMMEDIATE RASH, FACIAL/TONGUE/THROAT SWELLING, SOB, OR LIGHTHEADEDNESS WITH HYPOTENSION:  #  #  YES  #  #  Has patient had a PCN reaction causing severe rash involving mucus membranes or  skin necrosis: No Has patient had a PCN reaction that required hospitalization: No  Has patient had a PCN reaction occurring within the last 10 years: #  #  #  YES  #  #  #  If all of the above answers are "NO", then may proceed with Cephalosporin use.    . Buprenorphine Hcl Itching and Nausea Only  . Morphine And Related Itching and Nausea Only    No medications prior to admission.    Review of Systems  Respiratory: Negative.   Cardiovascular: Negative.     There were no vitals taken for this visit. Physical Exam Constitutional:      Appearance: She is obese.  Cardiovascular:     Rate and Rhythm: Normal rate and regular rhythm.     Heart sounds: Normal heart sounds.  Pulmonary:     Effort: Pulmonary effort is normal. No respiratory distress.     Breath sounds: Normal breath sounds.  Abdominal:     General: There is no distension.     Palpations: Abdomen is soft. There is no mass.     Tenderness: There is no abdominal tenderness.  Genitourinary:    Comments: Uterus slightly enlarged and irregular Musculoskeletal:     Cervical back: Normal range of motion and neck supple.  Neurological:     Mental Status: She is alert.     No results found for this or any previous visit (from the past 24 hour(s)).  No results found.  Assessment/Plan: Symptomatic large myomas, benign endometrial pathology.  All medical and surgical options have been discussed.  Surgical procedure, risks, alternatives, chances of relieving her symptoms have all been discussed, questions answered.  She wants ovaries removed due to FH ovarian cancer.  Will admit for TLH/BSO, possible cystoscopy, small chance of having to do TAH discussed.  Leighton Roach Chozen Latulippe 12/02/2020, 5:21 PM

## 2020-12-03 ENCOUNTER — Inpatient Hospital Stay (HOSPITAL_COMMUNITY)
Admission: AD | Admit: 2020-12-03 | Discharge: 2020-12-06 | DRG: 742 | Disposition: A | Discharge status: Home / Self Care | Payer: Medicare HMO | Attending: Obstetrics and Gynecology | Admitting: Obstetrics and Gynecology

## 2020-12-03 ENCOUNTER — Encounter (HOSPITAL_COMMUNITY): Payer: Self-pay | Admitting: Obstetrics and Gynecology

## 2020-12-03 ENCOUNTER — Other Ambulatory Visit: Payer: Self-pay

## 2020-12-03 ENCOUNTER — Encounter (HOSPITAL_COMMUNITY)
Admission: AD | Disposition: A | Discharge status: Home / Self Care | Payer: Self-pay | Source: Home / Self Care | Attending: Obstetrics and Gynecology

## 2020-12-03 ENCOUNTER — Ambulatory Visit (HOSPITAL_COMMUNITY): Payer: Medicare HMO | Admitting: Certified Registered Nurse Anesthetist

## 2020-12-03 DIAGNOSIS — D62 Acute posthemorrhagic anemia: Secondary | ICD-10-CM | POA: Diagnosis not present

## 2020-12-03 DIAGNOSIS — Z87442 Personal history of urinary calculi: Secondary | ICD-10-CM | POA: Diagnosis not present

## 2020-12-03 DIAGNOSIS — N856 Intrauterine synechiae: Secondary | ICD-10-CM | POA: Diagnosis present

## 2020-12-03 DIAGNOSIS — F32A Depression, unspecified: Secondary | ICD-10-CM | POA: Diagnosis present

## 2020-12-03 DIAGNOSIS — N938 Other specified abnormal uterine and vaginal bleeding: Secondary | ICD-10-CM | POA: Diagnosis not present

## 2020-12-03 DIAGNOSIS — J45909 Unspecified asthma, uncomplicated: Secondary | ICD-10-CM | POA: Diagnosis present

## 2020-12-03 DIAGNOSIS — N838 Other noninflammatory disorders of ovary, fallopian tube and broad ligament: Secondary | ICD-10-CM | POA: Diagnosis not present

## 2020-12-03 DIAGNOSIS — X58XXXA Exposure to other specified factors, initial encounter: Secondary | ICD-10-CM | POA: Diagnosis not present

## 2020-12-03 DIAGNOSIS — Z87891 Personal history of nicotine dependence: Secondary | ICD-10-CM

## 2020-12-03 DIAGNOSIS — S36409A Unspecified injury of unspecified part of small intestine, initial encounter: Secondary | ICD-10-CM | POA: Diagnosis present

## 2020-12-03 DIAGNOSIS — D252 Subserosal leiomyoma of uterus: Secondary | ICD-10-CM | POA: Diagnosis not present

## 2020-12-03 DIAGNOSIS — I10 Essential (primary) hypertension: Secondary | ICD-10-CM | POA: Diagnosis present

## 2020-12-03 DIAGNOSIS — D259 Leiomyoma of uterus, unspecified: Principal | ICD-10-CM | POA: Diagnosis present

## 2020-12-03 DIAGNOSIS — Z794 Long term (current) use of insulin: Secondary | ICD-10-CM | POA: Diagnosis not present

## 2020-12-03 DIAGNOSIS — G8928 Other chronic postprocedural pain: Secondary | ICD-10-CM | POA: Diagnosis not present

## 2020-12-03 DIAGNOSIS — Z6841 Body Mass Index (BMI) 40.0 and over, adult: Secondary | ICD-10-CM

## 2020-12-03 DIAGNOSIS — S36439A Laceration of unspecified part of small intestine, initial encounter: Secondary | ICD-10-CM | POA: Diagnosis not present

## 2020-12-03 DIAGNOSIS — Z79891 Long term (current) use of opiate analgesic: Secondary | ICD-10-CM | POA: Diagnosis not present

## 2020-12-03 DIAGNOSIS — N800 Endometriosis of uterus: Secondary | ICD-10-CM | POA: Diagnosis not present

## 2020-12-03 DIAGNOSIS — D649 Anemia, unspecified: Secondary | ICD-10-CM | POA: Diagnosis not present

## 2020-12-03 DIAGNOSIS — K660 Peritoneal adhesions (postprocedural) (postinfection): Secondary | ICD-10-CM | POA: Diagnosis present

## 2020-12-03 DIAGNOSIS — Z833 Family history of diabetes mellitus: Secondary | ICD-10-CM | POA: Diagnosis not present

## 2020-12-03 DIAGNOSIS — E119 Type 2 diabetes mellitus without complications: Secondary | ICD-10-CM | POA: Diagnosis not present

## 2020-12-03 DIAGNOSIS — Z8041 Family history of malignant neoplasm of ovary: Secondary | ICD-10-CM

## 2020-12-03 DIAGNOSIS — Z20822 Contact with and (suspected) exposure to covid-19: Secondary | ICD-10-CM | POA: Diagnosis present

## 2020-12-03 DIAGNOSIS — Z8249 Family history of ischemic heart disease and other diseases of the circulatory system: Secondary | ICD-10-CM

## 2020-12-03 DIAGNOSIS — D571 Sickle-cell disease without crisis: Secondary | ICD-10-CM | POA: Diagnosis present

## 2020-12-03 DIAGNOSIS — Z5331 Laparoscopic surgical procedure converted to open procedure: Secondary | ICD-10-CM

## 2020-12-03 DIAGNOSIS — Z803 Family history of malignant neoplasm of breast: Secondary | ICD-10-CM | POA: Diagnosis not present

## 2020-12-03 DIAGNOSIS — D251 Intramural leiomyoma of uterus: Secondary | ICD-10-CM | POA: Diagnosis not present

## 2020-12-03 DIAGNOSIS — Z98891 History of uterine scar from previous surgery: Secondary | ICD-10-CM

## 2020-12-03 DIAGNOSIS — Z90710 Acquired absence of both cervix and uterus: Secondary | ICD-10-CM | POA: Diagnosis present

## 2020-12-03 DIAGNOSIS — S36408A Unspecified injury of other part of small intestine, initial encounter: Secondary | ICD-10-CM | POA: Diagnosis not present

## 2020-12-03 HISTORY — PX: BOWEL RESECTION: SHX1257

## 2020-12-03 HISTORY — PX: TOTAL LAPAROSCOPIC HYSTERECTOMY WITH SALPINGECTOMY: SHX6742

## 2020-12-03 LAB — CBC
HCT: 30.1 % — ABNORMAL LOW (ref 36.0–46.0)
Hemoglobin: 8.5 g/dL — ABNORMAL LOW (ref 12.0–15.0)
MCH: 18.2 pg — ABNORMAL LOW (ref 26.0–34.0)
MCHC: 28.2 g/dL — ABNORMAL LOW (ref 30.0–36.0)
MCV: 64.3 fL — ABNORMAL LOW (ref 80.0–100.0)
Platelets: 452 10*3/uL — ABNORMAL HIGH (ref 150–400)
RBC: 4.68 MIL/uL (ref 3.87–5.11)
RDW: 22.5 % — ABNORMAL HIGH (ref 11.5–15.5)
WBC: 11.1 10*3/uL — ABNORMAL HIGH (ref 4.0–10.5)
nRBC: 0 % (ref 0.0–0.2)

## 2020-12-03 LAB — GLUCOSE, CAPILLARY
Glucose-Capillary: 124 mg/dL — ABNORMAL HIGH (ref 70–99)
Glucose-Capillary: 196 mg/dL — ABNORMAL HIGH (ref 70–99)
Glucose-Capillary: 204 mg/dL — ABNORMAL HIGH (ref 70–99)
Glucose-Capillary: 215 mg/dL — ABNORMAL HIGH (ref 70–99)
Glucose-Capillary: 257 mg/dL — ABNORMAL HIGH (ref 70–99)
Glucose-Capillary: 276 mg/dL — ABNORMAL HIGH (ref 70–99)
Glucose-Capillary: 65 mg/dL — ABNORMAL LOW (ref 70–99)

## 2020-12-03 LAB — SARS CORONAVIRUS 2 (TAT 6-24 HRS): SARS Coronavirus 2: NEGATIVE

## 2020-12-03 LAB — PREPARE RBC (CROSSMATCH)

## 2020-12-03 LAB — BASIC METABOLIC PANEL
Anion gap: 9 (ref 5–15)
BUN: 12 mg/dL (ref 6–20)
CO2: 27 mmol/L (ref 22–32)
Calcium: 8.6 mg/dL — ABNORMAL LOW (ref 8.9–10.3)
Chloride: 101 mmol/L (ref 98–111)
Creatinine, Ser: 0.84 mg/dL (ref 0.44–1.00)
GFR, Estimated: >60 mL/min (ref >60)
Glucose, Bld: 78 mg/dL (ref 70–99)
Potassium: 3.3 mmol/L — ABNORMAL LOW (ref 3.5–5.1)
Sodium: 137 mmol/L (ref 135–145)

## 2020-12-03 LAB — HEMOGLOBIN AND HEMATOCRIT, BLOOD
HCT: 29.8 % — ABNORMAL LOW (ref 36.0–46.0)
Hemoglobin: 8.9 g/dL — ABNORMAL LOW (ref 12.0–15.0)

## 2020-12-03 LAB — POCT PREGNANCY, URINE: Preg Test, Ur: NEGATIVE

## 2020-12-03 SURGERY — HYSTERECTOMY, TOTAL, LAPAROSCOPIC, WITH SALPINGECTOMY
Anesthesia: General

## 2020-12-03 MED ORDER — LACTATED RINGERS IV SOLN: INTRAVENOUS | Status: DC

## 2020-12-03 MED ORDER — ONDANSETRON HCL 4 MG/2ML IJ SOLN
INTRAMUSCULAR | Status: AC
  Filled 2020-12-03: qty 2

## 2020-12-03 MED ORDER — SODIUM CHLORIDE 0.9% IV SOLUTION: Freq: Once | INTRAVENOUS | Status: DC

## 2020-12-03 MED ORDER — KETOROLAC TROMETHAMINE 30 MG/ML IJ SOLN
30.0000 mg | Freq: Once | INTRAMUSCULAR | Status: AC
  Administered 2020-12-03: 30 mg via INTRAVENOUS

## 2020-12-03 MED ORDER — ONDANSETRON HCL 4 MG/2ML IJ SOLN: 4.0000 mg | Freq: Four times a day (QID) | INTRAMUSCULAR | Status: DC | PRN

## 2020-12-03 MED ORDER — ACETAMINOPHEN 500 MG PO TABS
1000.0000 mg | ORAL_TABLET | Freq: Four times a day (QID) | ORAL | Status: DC
  Administered 2020-12-03 – 2020-12-06 (×10): 1000 mg via ORAL
  Filled 2020-12-03 (×11): qty 2

## 2020-12-03 MED ORDER — ORAL CARE MOUTH RINSE: 15.0000 mL | Freq: Once | OROMUCOSAL | Status: AC

## 2020-12-03 MED ORDER — OXYCODONE HCL 5 MG PO TABS
5.0000 mg | ORAL_TABLET | ORAL | Status: DC | PRN
  Administered 2020-12-03: 5 mg via ORAL
  Administered 2020-12-04: 10 mg via ORAL
  Filled 2020-12-03 (×3): qty 1

## 2020-12-03 MED ORDER — PHENYLEPHRINE 40 MCG/ML (10ML) SYRINGE FOR IV PUSH (FOR BLOOD PRESSURE SUPPORT)
PREFILLED_SYRINGE | INTRAVENOUS | Status: AC
  Filled 2020-12-03: qty 10

## 2020-12-03 MED ORDER — SIMETHICONE 80 MG PO CHEW
80.0000 mg | CHEWABLE_TABLET | Freq: Four times a day (QID) | ORAL | Status: DC | PRN
  Administered 2020-12-03: 80 mg via ORAL
  Filled 2020-12-03: qty 1

## 2020-12-03 MED ORDER — MIDAZOLAM HCL 2 MG/2ML IJ SOLN: 0.5000 mg | Freq: Once | INTRAMUSCULAR | Status: DC | PRN

## 2020-12-03 MED ORDER — KETOROLAC TROMETHAMINE 30 MG/ML IJ SOLN
30.0000 mg | Freq: Four times a day (QID) | INTRAMUSCULAR | Status: AC
  Administered 2020-12-03 – 2020-12-04 (×3): 30 mg via INTRAVENOUS
  Filled 2020-12-03 (×4): qty 1

## 2020-12-03 MED ORDER — ALBUMIN HUMAN 5 % IV SOLN: INTRAVENOUS | Status: DC | PRN

## 2020-12-03 MED ORDER — CHLORHEXIDINE GLUCONATE 0.12 % MT SOLN: 15.0000 mL | Freq: Once | OROMUCOSAL | Status: AC

## 2020-12-03 MED ORDER — DEXTROSE-NACL 5-0.45 % IV SOLN: INTRAVENOUS | Status: DC

## 2020-12-03 MED ORDER — GABAPENTIN 300 MG PO CAPS
300.0000 mg | ORAL_CAPSULE | Freq: Three times a day (TID) | ORAL | Status: DC
  Administered 2020-12-03 (×2): 300 mg via ORAL
  Filled 2020-12-03 (×7): qty 1

## 2020-12-03 MED ORDER — POVIDONE-IODINE 10 % EX SWAB
2.0000 "application " | Freq: Once | CUTANEOUS | Status: AC
  Administered 2020-12-03: 2 via TOPICAL

## 2020-12-03 MED ORDER — DEXMEDETOMIDINE (PRECEDEX) IN NS 20 MCG/5ML (4 MCG/ML) IV SYRINGE
PREFILLED_SYRINGE | INTRAVENOUS | Status: AC
  Filled 2020-12-03: qty 5

## 2020-12-03 MED ORDER — FENTANYL CITRATE (PF) 250 MCG/5ML IJ SOLN
INTRAMUSCULAR | Status: AC
  Filled 2020-12-03: qty 5

## 2020-12-03 MED ORDER — BUPIVACAINE HCL (PF) 0.25 % IJ SOLN
INTRAMUSCULAR | Status: AC
  Filled 2020-12-03: qty 30

## 2020-12-03 MED ORDER — OXYCODONE HCL 5 MG PO TABS: 5.0000 mg | ORAL_TABLET | Freq: Once | ORAL | Status: DC | PRN

## 2020-12-03 MED ORDER — BISACODYL 10 MG RE SUPP: 10.0000 mg | Freq: Every day | RECTAL | Status: DC | PRN

## 2020-12-03 MED ORDER — FENTANYL CITRATE (PF) 100 MCG/2ML IJ SOLN
INTRAMUSCULAR | Status: DC | PRN
  Administered 2020-12-03 (×10): 50 ug via INTRAVENOUS

## 2020-12-03 MED ORDER — IBUPROFEN 800 MG PO TABS
800.0000 mg | ORAL_TABLET | Freq: Four times a day (QID) | ORAL | Status: DC
  Administered 2020-12-05 – 2020-12-06 (×5): 800 mg via ORAL
  Filled 2020-12-03 (×6): qty 1

## 2020-12-03 MED ORDER — HYDROMORPHONE HCL 1 MG/ML IJ SOLN
INTRAMUSCULAR | Status: AC
  Filled 2020-12-03: qty 1

## 2020-12-03 MED ORDER — HYDROCHLOROTHIAZIDE 25 MG PO TABS
25.0000 mg | ORAL_TABLET | Freq: Every day | ORAL | Status: DC
  Administered 2020-12-03 – 2020-12-06 (×4): 25 mg via ORAL
  Filled 2020-12-03 (×4): qty 1

## 2020-12-03 MED ORDER — DEXTROSE 5 % IV SOLN
INTRAVENOUS | Status: DC | PRN
  Administered 2020-12-03: 3 g via INTRAVENOUS

## 2020-12-03 MED ORDER — MIDAZOLAM HCL 2 MG/2ML IJ SOLN
INTRAMUSCULAR | Status: AC
  Filled 2020-12-03: qty 2

## 2020-12-03 MED ORDER — LACTATED RINGERS IV SOLN: INTRAVENOUS | Status: DC | PRN

## 2020-12-03 MED ORDER — ROCURONIUM BROMIDE 10 MG/ML (PF) SYRINGE
PREFILLED_SYRINGE | INTRAVENOUS | Status: AC
  Filled 2020-12-03: qty 20

## 2020-12-03 MED ORDER — NALOXONE HCL 0.4 MG/ML IJ SOLN: 0.4000 mg | INTRAMUSCULAR | Status: DC | PRN

## 2020-12-03 MED ORDER — PROPOFOL 10 MG/ML IV BOLUS
INTRAVENOUS | Status: AC
  Filled 2020-12-03: qty 20

## 2020-12-03 MED ORDER — DIAZEPAM 5 MG PO TABS
5.0000 mg | ORAL_TABLET | Freq: Two times a day (BID) | ORAL | Status: DC | PRN
  Administered 2020-12-04: 5 mg via ORAL
  Filled 2020-12-03: qty 1

## 2020-12-03 MED ORDER — SCOPOLAMINE 1 MG/3DAYS TD PT72
1.0000 | MEDICATED_PATCH | TRANSDERMAL | Status: DC
  Administered 2020-12-03: 1.5 mg via TRANSDERMAL
  Filled 2020-12-03: qty 1

## 2020-12-03 MED ORDER — DEXTROSE 50 % IV SOLN
25.0000 mL | Freq: Once | INTRAVENOUS | Status: AC
  Filled 2020-12-03: qty 50

## 2020-12-03 MED ORDER — KETAMINE HCL 50 MG/5ML IJ SOSY
PREFILLED_SYRINGE | INTRAMUSCULAR | Status: AC
  Filled 2020-12-03: qty 5

## 2020-12-03 MED ORDER — SUGAMMADEX SODIUM 200 MG/2ML IV SOLN
INTRAVENOUS | Status: DC | PRN
  Administered 2020-12-03: 300 mg via INTRAVENOUS

## 2020-12-03 MED ORDER — MIDAZOLAM HCL 5 MG/5ML IJ SOLN
INTRAMUSCULAR | Status: DC | PRN
  Administered 2020-12-03: 2 mg via INTRAVENOUS

## 2020-12-03 MED ORDER — BUPIVACAINE HCL (PF) 0.25 % IJ SOLN
INTRAMUSCULAR | Status: DC | PRN
  Administered 2020-12-03: 10 mL

## 2020-12-03 MED ORDER — CHLORHEXIDINE GLUCONATE 0.12 % MT SOLN
OROMUCOSAL | Status: AC
  Administered 2020-12-03: 15 mL via OROMUCOSAL
  Filled 2020-12-03: qty 15

## 2020-12-03 MED ORDER — HYDROMORPHONE HCL 1 MG/ML IJ SOLN
0.2500 mg | INTRAMUSCULAR | Status: DC | PRN
  Administered 2020-12-03 (×4): 0.5 mg via INTRAVENOUS

## 2020-12-03 MED ORDER — PROPOFOL 10 MG/ML IV BOLUS
INTRAVENOUS | Status: DC | PRN
  Administered 2020-12-03: 200 mg via INTRAVENOUS
  Administered 2020-12-03: 50 mg via INTRAVENOUS

## 2020-12-03 MED ORDER — HYDROMORPHONE 1 MG/ML IV SOLN
INTRAVENOUS | Status: AC
  Filled 2020-12-03: qty 30

## 2020-12-03 MED ORDER — LIDOCAINE 2% (20 MG/ML) 5 ML SYRINGE
INTRAMUSCULAR | Status: DC | PRN
  Administered 2020-12-03: 20 mg via INTRAVENOUS

## 2020-12-03 MED ORDER — DEXAMETHASONE SODIUM PHOSPHATE 10 MG/ML IJ SOLN
INTRAMUSCULAR | Status: AC
  Filled 2020-12-03: qty 1

## 2020-12-03 MED ORDER — ONDANSETRON HCL 4 MG PO TABS: 4.0000 mg | ORAL_TABLET | Freq: Four times a day (QID) | ORAL | Status: DC | PRN

## 2020-12-03 MED ORDER — KETAMINE HCL 10 MG/ML IJ SOLN
INTRAMUSCULAR | Status: DC | PRN
  Administered 2020-12-03: 50 mg via INTRAVENOUS
  Administered 2020-12-03 (×3): 10 mg via INTRAVENOUS

## 2020-12-03 MED ORDER — DEXMEDETOMIDINE (PRECEDEX) IN NS 20 MCG/5ML (4 MCG/ML) IV SYRINGE
PREFILLED_SYRINGE | INTRAVENOUS | Status: DC | PRN
  Administered 2020-12-03: 8 ug via INTRAVENOUS
  Administered 2020-12-03: 12 ug via INTRAVENOUS

## 2020-12-03 MED ORDER — PROMETHAZINE HCL 25 MG/ML IJ SOLN
6.2500 mg | INTRAMUSCULAR | Status: DC | PRN
Start: 2020-12-03 — End: 2020-12-03

## 2020-12-03 MED ORDER — ACETAMINOPHEN 500 MG PO TABS
1000.0000 mg | ORAL_TABLET | Freq: Once | ORAL | Status: AC
  Administered 2020-12-03: 1000 mg via ORAL
  Filled 2020-12-03: qty 2

## 2020-12-03 MED ORDER — DEXTROSE 50 % IV SOLN
INTRAVENOUS | Status: AC
  Administered 2020-12-03: 25 mL via INTRAVENOUS
  Filled 2020-12-03: qty 50

## 2020-12-03 MED ORDER — DEXAMETHASONE SODIUM PHOSPHATE 10 MG/ML IJ SOLN
INTRAMUSCULAR | Status: DC | PRN
  Administered 2020-12-03: 8 mg via INTRAVENOUS

## 2020-12-03 MED ORDER — ALUM & MAG HYDROXIDE-SIMETH 200-200-20 MG/5ML PO SUSP: 30.0000 mL | ORAL | Status: DC | PRN

## 2020-12-03 MED ORDER — OXYCODONE HCL 5 MG/5ML PO SOLN
5.0000 mg | Freq: Once | ORAL | Status: DC | PRN
Start: 2020-12-03 — End: 2020-12-03

## 2020-12-03 MED ORDER — KETOROLAC TROMETHAMINE 30 MG/ML IJ SOLN
INTRAMUSCULAR | Status: AC
  Filled 2020-12-03: qty 1

## 2020-12-03 MED ORDER — HYDROMORPHONE 1 MG/ML IV SOLN
INTRAVENOUS | Status: DC
Start: 2020-12-03 — End: 2020-12-04
  Administered 2020-12-03: 30 mg via INTRAVENOUS
  Administered 2020-12-03: 1.7 mg via INTRAVENOUS
  Administered 2020-12-04: 0 mg via INTRAVENOUS

## 2020-12-03 MED ORDER — INSULIN ASPART 100 UNIT/ML ~~LOC~~ SOLN
0.0000 [IU] | SUBCUTANEOUS | Status: DC
  Administered 2020-12-03 (×2): 11 [IU] via SUBCUTANEOUS
  Administered 2020-12-04: 3 [IU] via SUBCUTANEOUS
  Administered 2020-12-04: 11 [IU] via SUBCUTANEOUS
  Administered 2020-12-04: 4 [IU] via SUBCUTANEOUS
  Administered 2020-12-04: 3 [IU] via SUBCUTANEOUS
  Administered 2020-12-04 – 2020-12-05 (×2): 4 [IU] via SUBCUTANEOUS
  Administered 2020-12-05: 7 [IU] via SUBCUTANEOUS
  Administered 2020-12-05: 15 [IU] via SUBCUTANEOUS
  Administered 2020-12-06: 4 [IU] via SUBCUTANEOUS
  Administered 2020-12-06: 3 [IU] via SUBCUTANEOUS

## 2020-12-03 MED ORDER — SODIUM CHLORIDE 0.9% FLUSH: 9.0000 mL | INTRAVENOUS | Status: DC | PRN

## 2020-12-03 MED ORDER — SODIUM CHLORIDE 0.9 % IR SOLN
Status: DC | PRN
  Administered 2020-12-03: 3000 mL

## 2020-12-03 MED ORDER — ONDANSETRON HCL 4 MG/2ML IJ SOLN
INTRAMUSCULAR | Status: DC | PRN
  Administered 2020-12-03: 4 mg via INTRAVENOUS

## 2020-12-03 MED ORDER — SODIUM CHLORIDE 0.9 % IV SOLN: INTRAVENOUS | Status: DC | PRN

## 2020-12-03 MED ORDER — MENTHOL 3 MG MT LOZG: 1.0000 | LOZENGE | OROMUCOSAL | Status: DC | PRN

## 2020-12-03 MED ORDER — LIDOCAINE 2% (20 MG/ML) 5 ML SYRINGE
INTRAMUSCULAR | Status: AC
  Filled 2020-12-03: qty 5

## 2020-12-03 MED ORDER — MEPERIDINE HCL 25 MG/ML IJ SOLN
6.2500 mg | INTRAMUSCULAR | Status: DC | PRN
Start: 2020-12-03 — End: 2020-12-03

## 2020-12-03 MED ORDER — ROCURONIUM BROMIDE 10 MG/ML (PF) SYRINGE
PREFILLED_SYRINGE | INTRAVENOUS | Status: DC | PRN
  Administered 2020-12-03: 15 mg via INTRAVENOUS
  Administered 2020-12-03: 30 mg via INTRAVENOUS
  Administered 2020-12-03: 10 mg via INTRAVENOUS
  Administered 2020-12-03: 15 mg via INTRAVENOUS
  Administered 2020-12-03: 20 mg via INTRAVENOUS
  Administered 2020-12-03: 60 mg via INTRAVENOUS
  Administered 2020-12-03: 15 mg via INTRAVENOUS

## 2020-12-03 MED ORDER — DIPHENHYDRAMINE HCL 12.5 MG/5ML PO ELIX: 12.5000 mg | ORAL_SOLUTION | Freq: Four times a day (QID) | ORAL | Status: DC | PRN

## 2020-12-03 MED ORDER — ENOXAPARIN SODIUM 40 MG/0.4ML ~~LOC~~ SOLN
40.0000 mg | SUBCUTANEOUS | Status: DC
  Administered 2020-12-04 – 2020-12-06 (×3): 40 mg via SUBCUTANEOUS
  Filled 2020-12-03 (×3): qty 0.4

## 2020-12-03 MED ORDER — DIPHENHYDRAMINE HCL 50 MG/ML IJ SOLN: 12.5000 mg | Freq: Four times a day (QID) | INTRAMUSCULAR | Status: DC | PRN

## 2020-12-03 SURGICAL SUPPLY — 49 items
BENZOIN TINCTURE PRP APPL 2/3 (GAUZE/BANDAGES/DRESSINGS) ×4 IMPLANT
COVER MAYO STAND STRL (DRAPES) ×4 IMPLANT
DERMABOND ADVANCED (GAUZE/BANDAGES/DRESSINGS) ×1
DERMABOND ADVANCED .7 DNX12 (GAUZE/BANDAGES/DRESSINGS) ×3 IMPLANT
DRSG OPSITE POSTOP 4X10 (GAUZE/BANDAGES/DRESSINGS) ×4 IMPLANT
DURAPREP 26ML APPLICATOR (WOUND CARE) ×4 IMPLANT
FILTER SMOKE EVAC LAPAROSHD (FILTER) ×4 IMPLANT
GLOVE BIO SURGEON STRL SZ8 (GLOVE) ×4 IMPLANT
GLOVE BIOGEL PI IND STRL 7.0 (GLOVE) ×6 IMPLANT
GLOVE BIOGEL PI INDICATOR 7.0 (GLOVE) ×2
GLOVE ORTHO TXT STRL SZ7.5 (GLOVE) ×8 IMPLANT
GOWN STRL REUS W/ TWL LRG LVL3 (GOWN DISPOSABLE) ×6 IMPLANT
GOWN STRL REUS W/ TWL XL LVL3 (GOWN DISPOSABLE) ×6 IMPLANT
GOWN STRL REUS W/TWL LRG LVL3 (GOWN DISPOSABLE) ×2
GOWN STRL REUS W/TWL XL LVL3 (GOWN DISPOSABLE) ×2
HIBICLENS CHG 4% 4OZ BTL (MISCELLANEOUS) ×4 IMPLANT
KIT TURNOVER KIT B (KITS) ×4 IMPLANT
NS IRRIG 1000ML POUR BTL (IV SOLUTION) ×4 IMPLANT
OCCLUDER COLPOPNEUMO (BALLOONS) ×4 IMPLANT
PACK LAVH (CUSTOM PROCEDURE TRAY) ×4 IMPLANT
PACK TRENDGUARD 450 HYBRID PRO (MISCELLANEOUS) ×3 IMPLANT
PROTECTOR NERVE ULNAR (MISCELLANEOUS) ×8 IMPLANT
RELOAD PROXIMATE 75MM BLUE (ENDOMECHANICALS) ×8 IMPLANT
SET CYSTO W/LG BORE CLAMP LF (SET/KITS/TRAYS/PACK) ×4 IMPLANT
SET IRRIG TUBING LAPAROSCOPIC (IRRIGATION / IRRIGATOR) ×4 IMPLANT
SET TUBE SMOKE EVAC HIGH FLOW (TUBING) ×4 IMPLANT
SHEARS FOC LG CVD HARMONIC 17C (MISCELLANEOUS) ×4 IMPLANT
SLEEVE ENDOPATH XCEL 5M (ENDOMECHANICALS) ×4 IMPLANT
STAPLER GUN LINEAR PROX 60 (STAPLE) ×4 IMPLANT
STAPLER PROXIMATE 75MM BLUE (STAPLE) ×4 IMPLANT
STAPLER SKIN 35 WIDE (STAPLE) ×4 IMPLANT
SUT SILK 2 0 SH CR/8 (SUTURE) ×4 IMPLANT
SUT VIC AB 0 CT1 27 (SUTURE) ×1
SUT VIC AB 0 CT1 27XBRD ANBCTR (SUTURE) ×3 IMPLANT
SUT VIC AB 2-0 CT1 36 (SUTURE) ×4 IMPLANT
SUT VICRYL 0 UR6 27IN ABS (SUTURE) ×8 IMPLANT
SUT VICRYL 4-0 PS2 18IN ABS (SUTURE) ×4 IMPLANT
SYR 10ML LL (SYRINGE) ×4 IMPLANT
SYR 50ML LL SCALE MARK (SYRINGE) ×4 IMPLANT
TAPE STRIPS DRAPE STRL (GAUZE/BANDAGES/DRESSINGS) ×4 IMPLANT
TIP UTERINE 6.7X8CM BLUE DISP (MISCELLANEOUS) ×4 IMPLANT
TOWEL GREEN STERILE FF (TOWEL DISPOSABLE) ×4 IMPLANT
TRAY FOLEY W/BAG SLVR 14FR (SET/KITS/TRAYS/PACK) ×4 IMPLANT
TRENDGUARD 450 HYBRID PRO PACK (MISCELLANEOUS) ×4
TROCAR BALLN 12MMX100 BLUNT (TROCAR) ×4 IMPLANT
TROCAR XCEL DIL TIP R 11M (ENDOMECHANICALS) ×4 IMPLANT
TROCAR XCEL NON-BLD 5MMX100MML (ENDOMECHANICALS) ×4 IMPLANT
UNDERPAD 30X36 HEAVY ABSORB (UNDERPADS AND DIAPERS) ×4 IMPLANT
WARMER LAPAROSCOPE (MISCELLANEOUS) ×4 IMPLANT

## 2020-12-03 NOTE — Anesthesia Procedure Notes (Signed)
Procedure Name: Intubation Date/Time: 12/03/2020 7:49 AM Performed by: Inda Coke, CRNA Pre-anesthesia Checklist: Patient identified, Emergency Drugs available, Suction available and Patient being monitored Patient Re-evaluated:Patient Re-evaluated prior to induction Oxygen Delivery Method: Circle System Utilized Preoxygenation: Pre-oxygenation with 100% oxygen Induction Type: IV induction Ventilation: Mask ventilation without difficulty and Oral airway inserted - appropriate to patient size Laryngoscope Size: Mac and 4 Grade View: Grade I Tube type: Oral Tube size: 7.5 mm Number of attempts: 1 Airway Equipment and Method: Stylet and Oral airway Placement Confirmation: ETT inserted through vocal cords under direct vision,  positive ETCO2 and breath sounds checked- equal and bilateral Secured at: 22 cm Tube secured with: Tape Dental Injury: Teeth and Oropharynx as per pre-operative assessment

## 2020-12-03 NOTE — Transfer of Care (Signed)
Immediate Anesthesia Transfer of Care Note  Patient: Cynthia Rosales  Procedure(s) Performed: ATTEMPTED  LAPAROSCOPIC, OPEN ABDOMINAL HYSTERECTOMY WITH BILATERAL SALPINGO-OOPHERECTOMY AND LYSIS OF ADHESIONS (Bilateral ) SMALL BOWEL RESECTION  Patient Location: PACU  Anesthesia Type:General  Level of Consciousness: awake and alert   Airway & Oxygen Therapy: Patient Spontanous Breathing and Patient connected to face mask oxygen  Post-op Assessment: Report given to RN and Post -op Vital signs reviewed and stable  Post vital signs: Reviewed and stable  Last Vitals:  Vitals Value Taken Time  BP 168/89 12/03/20 1225  Temp    Pulse 110 12/03/20 1233  Resp 23 12/03/20 1233  SpO2 100 % 12/03/20 1233  Vitals shown include unvalidated device data.  Last Pain:  Vitals:   12/03/20 0610  PainSc: 10-Worst pain ever      Patients Stated Pain Goal: 2 (12/03/20 0610)  Complications: No complications documented.

## 2020-12-03 NOTE — Progress Notes (Signed)
Addendum to H&P Update She wants ovaries removed, will be doing BSO, not just salpingectomy.  Also discussed possibility of abdominal hysterectomy.

## 2020-12-03 NOTE — Progress Notes (Signed)
Post-op check Doing ok, having some pain but medication helps, some nausea, no emesis, tolerating PO liquids Afeb, VSS  Adequate clear urine Abd- soft, tender, dressings C/D/I  Discussed surgery, findings, bowel injury and surgical consultation and resection/repair.  Continue current care, continue PCA overnight

## 2020-12-03 NOTE — Anesthesia Postprocedure Evaluation (Signed)
Anesthesia Post Note  Patient: Cynthia Rosales  Procedure(s) Performed: ATTEMPTED  LAPAROSCOPIC, OPEN ABDOMINAL HYSTERECTOMY WITH BILATERAL SALPINGO-OOPHERECTOMY AND LYSIS OF ADHESIONS (Bilateral ) SMALL BOWEL RESECTION     Patient location during evaluation: PACU Anesthesia Type: General Level of consciousness: awake and alert, patient cooperative and oriented Pain management: satisfactory to patient Vital Signs Assessment: post-procedure vital signs reviewed and stable Respiratory status: spontaneous breathing, nonlabored ventilation and respiratory function stable Cardiovascular status: blood pressure returned to baseline and stable Postop Assessment: no apparent nausea or vomiting Anesthetic complications: no   No complications documented.  Last Vitals:  Vitals:   12/03/20 1225 12/03/20 1255  BP: (!) 168/89 (!) 159/85  Pulse: (!) 105 (!) 105  Resp: 11 13  Temp: 36.5 C   SpO2: 100% 100%    Last Pain:  Vitals:   12/03/20 1249  PainSc: 7                  Trinda Harlacher,E. [name]

## 2020-12-03 NOTE — Interval H&P Note (Signed)
History and Physical Interval Note:  12/03/2020 7:25 AM  Cynthia Rosales  has presented today for surgery, with the diagnosis of symptomatic myomas, pain and anemia.  The various methods of treatment have been discussed with the patient and family. After consideration of risks, benefits and other options for treatment, the patient has consented to  Procedure(s): TOTAL LAPAROSCOPIC HYSTERECTOMY WITH SALPINGECTOMY (Bilateral) CYSTOSCOPY (N/A) as a surgical intervention.  The patient's history has been reviewed, patient examined, no change in status, stable for surgery.  I have reviewed the patient's chart and labs.  Questions were answered to the patient's satisfaction.     Leighton Roach Marrion Accomando

## 2020-12-03 NOTE — Op Note (Signed)
Preoperative diagnosis: small bowel injury  Postoperative diagnosis: same   Procedure: small bowel resection with anatomosis  Surgeon: Feliciana Rossetti, M.D.  Co surgeon: Lavina Hamman, M.D.  Anesthesia: general  Indications for procedure: Cynthia Rosales is a 40 y.o. year old female undergoing lap converted to open hysterectomy. Upon entrance of the pfannenstiel incision a loop of intestine was encountered and I was called for intraoperative consult.  Description of procedure: The rectus muscles were separated an a small loop of small intestine which appeared to be densely adhered to the abdominal wall and had a full thickness defect. Sharp dissection was used to free the loop of intestine away from the abdominal wall and off the superior wall of the uterus. The abdominal wall had some adhesions of the omentum that were lysed with cautery. The intestine had a 5 cm portion that had multiple serosal defects and a 2 cm full thickness defect. Decision was made to resect this portion of the intestine.  A GIA 75 mm stapler was used to divide the intestine proximal and distal to the area. The mesentery was taken with 2-0 silk stick ties. The small intestine was removed. A side to side functional end to end anastomosis was created with a GIA 75 mm blue load stapler and the defect was closed with a 60 mm TA stapler. 2 2-0 silks were used to imbricate the staple line at bleeding sites. The anastomosis appeared patent. The intestine was reduced into the abdominal cavity.  Please see Dr. Eligha Bridegroom note for the remainder of the procedure.  Findings: dense adhesions of small intestine to the lower abdominal wall, full thickness defect of small intestine with multiple serosal defects in a 5 cm portion of small intestine, intact anastomosis  Specimen: small intestine  Implant: none   Blood loss: 30 ml  Local anesthesia: none  Complications: none  Feliciana Rossetti, M.D. General, Bariatric, &  Minimally Invasive Surgery [facility]

## 2020-12-03 NOTE — Op Note (Addendum)
Preoperative diagnosis: Symptomatic myomatous uterus Postoperative diagnosis: Same, injury to small bowel Procedure: Attempted laparoscopy, Total abdominal hysterectomy, bilateral salpingo-oophorectomy, repair of small bowel injury by Dr. Kieth Brightly Surgeon: Cheri Fowler M.D. Assistant: Carlynn Purl, D.O. Anesthesia: General Findings: She had dense adhesions of the uterus, omentum, and small bowel to the anterior abdominal wall, precluding laparoscopic hysterectomy.  Upon entering the abdominal cavity, injury to the small bowel was noticed and Dr. Kieth Brightly was consulted for repair.  The uterus was enlarged and irregular with at least 2 large myomas, tubes and ovaries appeared normal.  One unit of PRBC transfused intraop for pre-existing anemia with acute intraoperative blood loss.  Assistance from Dr. Terri Piedra was most definitely necessary due to the patient's obesity and the difficult nature of this case Estimated blood loss: [id number] Complications: Small bowel injury  Procedure in detail:  The patient was taken to the operating room and placed in the dorsosupine position in low stirrups. General anestheiai was induced.  It took some time to position the patient and get a second functioning IV. Abdomen, perineum and vagina were prepped and draped in the usual sterile fashion.  I then placed an 8 mm RUMI into the uterus using the small metal Koh ring, and a foley catheter was placed.  I then changed gown and gloves.  Infraumbilical skin was infiltrated with 0.25% Marcaine, a 3-4 cm horizontal incision was made. Fascia was identified and entered sharply and incision extended, peritoneum was entered.  There did appear to be omentum adherent to the anterior abdominal wall by palpation.  A pursestring suture of 0 Vicryl was placed around the fascia, a Hasson trocar with balloon was inserted and the abdomen insufflated.  With careful inspection there appeared to be significant adhesions of bowel and omentum to  the anterior abdominal wall, making a laparoscopic approach impossible.  The Hasson cannula was removed and the pursestring suture was tied.  I then attempted to enter her abdomen via a standard Pfannenstiel incision through her previous scar.  She had significant adhesions in the abdominal wall from her 3 previous c-sections.  In the process of attempting to carefully enter the peritoneal cavity, it became apparent that small bowel was adherent at the entry point into the peritoneum and some injury to this small bowel was apparent.  General surgery was consulted and Dr. Kieth Brightly arrived for evaluation and repair of this injury-see his op note.  While I was awaiting his arrival, there was a defect palpated in the infraumbilical fascia where the pursestring suture had been tied.  This defect was closed with running 0-Vicryl with adequate closure.  The infraumbilical skin incision was closed with running subcuticular 4-0 Vicryl.  Once Dr. Kieth Brightly had addressed the bowel injury, he helped make sure we had adequate intraabdominal access and then he scrubbed out.  Bowels were packed out of the pelvis. Visualization was difficult at times as bowels kept prolapsing past the packs.  The anterior uterus was densely adherent to the anterior abdominal wall.  With difficulty and careful dissection, I gradually freed the uterus from the abdominal wall.  Uterine cornu were grasped with large Kelly clamps. Round ligaments were taken down with electrocautery. On the left side a window was made in an avascular portion of the broad ligament, the ovary and tube was elevated and the infundibulopelvic ligament was taken down with Harmonic scalpel.  We were initially unable to do the same on the right side as the tube and ovary were stuck in the pelvis. On  the left side, I was able to continue using the Harmonic scalpel to take down adhesions and free the bladder flap across the anterior portion of the cervix, then take down the  uterine artery and cardinal ligament.  On the right side, the round ligament was taken down with harmonic scalpel, and this then allowed [place] to take down adhesions and the uteroovarian ligament.  Bladder flap was completed from the right side and bladder pushed well inferior.  Uterine artery and cardinal ligament were then also taken down with harmonic scalpel. Zeppelin clamps and harmonic scalpel were then used to continue coming down the sides of the cervix, eventually taking the vaginal angles on each side and the uterus and cervix with were removed using harmonic scalpel.  Vaginal cuff was then closed with 2 figure 8 sutures of #1 Chromic. I was then able to grasp the right tube and ovary and elevate them, taking down all attachments and infundibulopelvic ligament with harmonic scalpel.  Once the tube and ovary were removed, bleeding of the IP ligament was identified with difficulty and controlled with figure 8 #1 Chromic.  All pedicles were inspected and found to be hemostatic. The pelvis was irrigated and found to be hemostatic. The lap sponges were removed from the abdomen. Peritoneum was identified and closed with running 2-0 Vicryl with difficult visualization. Fascia was closed in a running fashion starting at both ends and meeting in the middle with 0 Vicryl. Subcutaneous tissue was irrigated and made hemostatic with Bovie. Subcutaneous tissue was then closed with running 2-0 plain gut suture. Skins incision was then closed with staples followed by sterile dressing. Patient tolerated procedure well and was taken to the PACU in stable condition. Counts were correct, she had PAS hose on throughout the procedure, she received appropriate antibiotic prophylaxis.

## 2020-12-04 ENCOUNTER — Encounter (HOSPITAL_COMMUNITY): Payer: Self-pay | Admitting: Obstetrics and Gynecology

## 2020-12-04 LAB — TYPE AND SCREEN
ABO/RH(D): O POS
Antibody Screen: NEGATIVE
Unit division: 0

## 2020-12-04 LAB — CBC
HCT: 27.5 % — ABNORMAL LOW (ref 36.0–46.0)
Hemoglobin: 8.2 g/dL — ABNORMAL LOW (ref 12.0–15.0)
MCH: 19.4 pg — ABNORMAL LOW (ref 26.0–34.0)
MCHC: 29.8 g/dL — ABNORMAL LOW (ref 30.0–36.0)
MCV: 65 fL — ABNORMAL LOW (ref 80.0–100.0)
Platelets: 399 10*3/uL (ref 150–400)
RBC: 4.23 MIL/uL (ref 3.87–5.11)
RDW: 23.9 % — ABNORMAL HIGH (ref 11.5–15.5)
WBC: 15.2 10*3/uL — ABNORMAL HIGH (ref 4.0–10.5)
nRBC: 0 % (ref 0.0–0.2)

## 2020-12-04 LAB — SURGICAL PATHOLOGY

## 2020-12-04 LAB — BPAM RBC
Blood Product Expiration Date: 202202082359
ISSUE DATE / TIME: 202201041144
Unit Type and Rh: 5100

## 2020-12-04 LAB — GLUCOSE, CAPILLARY
Glucose-Capillary: 109 mg/dL — ABNORMAL HIGH (ref 70–99)
Glucose-Capillary: 133 mg/dL — ABNORMAL HIGH (ref 70–99)
Glucose-Capillary: 134 mg/dL — ABNORMAL HIGH (ref 70–99)
Glucose-Capillary: 186 mg/dL — ABNORMAL HIGH (ref 70–99)

## 2020-12-04 MED ORDER — OXYCODONE HCL 5 MG PO TABS
30.0000 mg | ORAL_TABLET | ORAL | Status: DC | PRN
  Administered 2020-12-04 – 2020-12-06 (×6): 30 mg via ORAL
  Filled 2020-12-04 (×6): qty 6

## 2020-12-04 NOTE — Plan of Care (Signed)
  Problem: Education: Goal: Knowledge of General Education information will improve Description: Including pain rating scale, medication(s)/side effects and non-pharmacologic comfort measures Outcome: Progressing   Problem: Health Behavior/Discharge Planning: Goal: Ability to manage health-related needs will improve Outcome: Progressing   Problem: Clinical Measurements: Goal: Ability to maintain clinical measurements within normal limits will improve Outcome: Progressing Goal: Will remain free from infection Outcome: Progressing Goal: Diagnostic test results will improve Outcome: Progressing Goal: Respiratory complications will improve Outcome: Progressing Goal: Cardiovascular complication will be avoided Outcome: Progressing   Problem: Activity: Goal: Risk for activity intolerance will decrease Outcome: Progressing   Problem: Nutrition: Goal: Adequate nutrition will be maintained Outcome: Progressing   Problem: Coping: Goal: Level of anxiety will decrease Outcome: Progressing   Problem: Elimination: Goal: Will not experience complications related to bowel motility Outcome: Progressing Goal: Will not experience complications related to urinary retention Outcome: Progressing   Problem: Pain Managment: Goal: General experience of comfort will improve Outcome: Progressing   Problem: Safety: Goal: Ability to remain free from injury will improve Outcome: Progressing   Problem: Skin Integrity: Goal: Risk for impaired skin integrity will decrease Outcome: Progressing   Problem: Education: Goal: Knowledge of the prescribed therapeutic regimen will improve Outcome: Progressing Goal: Understanding of sexual limitations or changes related to disease process or condition will improve Outcome: Progressing Goal: Individualized Educational Video(s) Outcome: Progressing   Problem: Self-Concept: Goal: Communication of feelings regarding changes in body function or  appearance will improve Outcome: Progressing   Problem: Skin Integrity: Goal: Demonstration of wound healing without infection will improve Outcome: Progressing   

## 2020-12-04 NOTE — Progress Notes (Signed)
S: nauseated this morning, +flatus O: BP 128/75 (BP Location: Right Arm)   Pulse 83   Temp 98.8 F (37.1 C) (Oral)   Resp 18   Ht 5\' 3"  (1.6 m)   Wt (!) 140.6 kg   SpO2 100%   BMI 54.91 kg/m  Gen: NAD Neuro: AOx4 Abd: soft, nondistended  A/P POD 1 SBR during hysterectomy -continue clear liquids due to nausea

## 2020-12-04 NOTE — Progress Notes (Signed)
She is having significant pain, still some nausea but better She says at home she takes 45 mg oxycodone every 4 hours for chronic pain, and this just takes the edge off her pain-does not relieve it Discussed the usual amount of oxycodone we use postoperatively Will increase her oxycodone to 30 mg po q 4 hrs prn and see how she does

## 2020-12-04 NOTE — Progress Notes (Signed)
1 Day Post-Op Procedure(s) (LRB): ATTEMPTED  LAPAROSCOPIC, OPEN ABDOMINAL HYSTERECTOMY WITH BILATERAL SALPINGO-OOPHERECTOMY AND LYSIS OF ADHESIONS (Bilateral) SMALL BOWEL RESECTION  Subjective: Patient reports nausea and incisional pain.    Objective: I have reviewed patient's vital signs, intake and output and labs.  General: alert GI: soft, ND, tender, dressings C/D/I   CBC    Component Value Date/Time   WBC 15.2 (H) 12/04/2020 0110   RBC 4.23 12/04/2020 0110   HGB 8.2 (L) 12/04/2020 0110   HCT 27.5 (L) 12/04/2020 0110   PLT 399 12/04/2020 0110   MCV 65.0 (L) 12/04/2020 0110   MCH 19.4 (L) 12/04/2020 0110   MCHC 29.8 (L) 12/04/2020 0110   RDW 23.9 (H) 12/04/2020 0110   LYMPHSABS 4.8 (H) 10/01/2020 2240   MONOABS 0.6 10/01/2020 2240   EOSABS 0.1 10/01/2020 2240   BASOSABS 0.1 10/01/2020 2240    Assessment: s/p Procedure(s): ATTEMPTED  LAPAROSCOPIC, OPEN ABDOMINAL HYSTERECTOMY WITH BILATERAL SALPINGO-OOPHERECTOMY AND LYSIS OF ADHESIONS (Bilateral) SMALL BOWEL RESECTION: stable and progressing well  Plan: Advance diet Encourage ambulation Advance to PO medication Discontinue IV fluids  LOS: 1 day    Zenaida Niece 12/04/2020, 8:26 AM

## 2020-12-04 NOTE — Progress Notes (Signed)
PCA discontinued. 20 mL of Hydromorphone wasted and witness by Lelon Mast Mphoka RN.

## 2020-12-04 NOTE — Progress Notes (Signed)
Paged [facility] PA for medication orders.  Awaiting call back.

## 2020-12-05 LAB — GLUCOSE, CAPILLARY
Glucose-Capillary: 180 mg/dL — ABNORMAL HIGH (ref 70–99)
Glucose-Capillary: 221 mg/dL — ABNORMAL HIGH (ref 70–99)
Glucose-Capillary: 247 mg/dL — ABNORMAL HIGH (ref 70–99)
Glucose-Capillary: 286 mg/dL — ABNORMAL HIGH (ref 70–99)
Glucose-Capillary: 229 mg/dL — ABNORMAL HIGH (ref 70–99)
Glucose-Capillary: 347 mg/dL — ABNORMAL HIGH (ref 70–99)

## 2020-12-05 MED ORDER — HYDROMORPHONE HCL 1 MG/ML IJ SOLN
1.0000 mg | INTRAMUSCULAR | Status: DC | PRN
  Administered 2020-12-05 (×3): 1 mg via INTRAVENOUS
  Filled 2020-12-05 (×3): qty 1

## 2020-12-05 MED ORDER — DOCUSATE SODIUM 100 MG PO CAPS
100.0000 mg | ORAL_CAPSULE | Freq: Two times a day (BID) | ORAL | Status: DC
  Administered 2020-12-05 – 2020-12-06 (×3): 100 mg via ORAL
  Filled 2020-12-05 (×3): qty 1

## 2020-12-05 MED ORDER — INSULIN ASPART PROT & ASPART (70-30 MIX) 100 UNIT/ML ~~LOC~~ SUSP
36.0000 [IU] | Freq: Three times a day (TID) | SUBCUTANEOUS | Status: DC
  Administered 2020-12-05 – 2020-12-06 (×3): 36 [IU] via SUBCUTANEOUS
  Filled 2020-12-05: qty 10

## 2020-12-05 MED ORDER — POLYETHYLENE GLYCOL 3350 17 G PO PACK
17.0000 g | PACK | Freq: Every day | ORAL | Status: DC
  Administered 2020-12-05: 17 g via ORAL
  Filled 2020-12-05 (×2): qty 1

## 2020-12-05 NOTE — Progress Notes (Signed)
[facility] Progress Note  2 Days Post-Op  Subjective: CC-  Sitting up in bed eating breakfast. Having some more abdominal pain today. Pain medication does help take the edge off. Nausea resolved. Passing flatus, no BM. Tolerating carb mod diet.  Objective: Vital signs in last 24 hours: Temp:  [97.6 F (36.4 C)-98.4 F (36.9 C)] 98.1 F (36.7 C) (01/06 0500) Pulse Rate:  [98-101] 98 (01/06 0500) Resp:  [18-20] 18 (01/06 0500) BP: (115-127)/(70-82) 115/71 (01/06 0500) SpO2:  [99 %-100 %] 99 % (01/06 0500)    Intake/Output from previous day: 01/05 0701 - 01/06 0700 In: 100 [P.O.:100] Out: -  Intake/Output this shift: No intake/output data recorded.  PE: Gen:  Alert, NAD, pleasant Pulm:  rate and effort normal Abd: obese, soft, appropriately tender, honeycomb dressing over pfannenstiel incision Skin: warm and dry  Lab Results:  Recent Labs    12/03/20 0556 12/03/20 1830 12/04/20 0110  WBC 11.1*  --  15.2*  HGB 8.5* 8.9* 8.2*  HCT 30.1* 29.8* 27.5*  PLT 452*  --  399   BMET Recent Labs    12/03/20 0556  NA 137  K 3.3*  CL 101  CO2 27  GLUCOSE 78  BUN 12  CREATININE 0.84  CALCIUM 8.6*   PT/INR No results for input(s): LABPROT, INR in the last 72 hours. CMP     Component Value Date/Time   NA 137 12/03/2020 0556   NA 140 11/19/2014 0933   K 3.3 (L) 12/03/2020 0556   CL 101 12/03/2020 0556   CO2 27 12/03/2020 0556   GLUCOSE 78 12/03/2020 0556   BUN 12 12/03/2020 0556   BUN 10 11/19/2014 0933   CREATININE 0.84 12/03/2020 0556   CALCIUM 8.6 (L) 12/03/2020 0556   PROT 6.9 10/22/2020 0708   PROT 6.7 11/19/2014 0933   ALBUMIN 3.1 (L) 10/22/2020 0708   ALBUMIN 3.3 (L) 11/19/2014 0933   AST 11 (L) 10/22/2020 0708   ALT 12 10/22/2020 0708   ALKPHOS 78 10/22/2020 0708   BILITOT 0.4 10/22/2020 0708   GFRNONAA >60 12/03/2020 0556   GFRAA >60 04/08/2020 0552   Lipase     Component Value Date/Time   LIPASE 22 08/11/2019 1243        Studies/Results: No results found.  Anti-infectives: Anti-infectives (From admission, onward)   Start     Dose/Rate Route Frequency Ordered Stop   12/03/20 0715  ceFAZolin (ANCEF) 3 g in dextrose 5 % 50 mL IVPB  Status:  Discontinued        3 g 100 mL/hr over 30 Minutes Intravenous On call to O.R. 12/02/20 1126 12/03/20 1435       Assessment/Plan DM HTN Sickle cell anemia Chronic narcotic use   Attempted laparoscopy, Total abdominal hysterectomy, bilateral salpingo-oophorectomy 1/4 Dr. Jackelyn Knife  Small bowel injury S/p small bowel resection with anastomosis 1/4 Dr. Sheliah Hatch - Nausea improved and bowel function returning. Patient was already advanced to CM diet. Add bowel regimen (miralax, colace). Ok for discharge once tolerating diet and pain controlled on oral regimen. Follow up with [facility] on AVS.  ID - ancef periop FEN - CM diet VTE - SCDs, lovenox Foley - none Follow up - Dr. Sheliah Hatch, Dr. Jackelyn Knife   LOS: 2 days    Franne Forts, [facility] Surgery 12/05/2020, 8:32 AM Please see Amion for pager number during day hours 7:00am-4:30pm

## 2020-12-05 NOTE — Plan of Care (Signed)
  Problem: Education: Goal: Knowledge of General Education information will improve Description: Including pain rating scale, medication(s)/side effects and non-pharmacologic comfort measures Outcome: Progressing   Problem: Health Behavior/Discharge Planning: Goal: Ability to manage health-related needs will improve Outcome: Progressing   Problem: Clinical Measurements: Goal: Ability to maintain clinical measurements within normal limits will improve Outcome: Progressing Goal: Will remain free from infection Outcome: Progressing Goal: Diagnostic test results will improve Outcome: Progressing Goal: Respiratory complications will improve Outcome: Progressing Goal: Cardiovascular complication will be avoided Outcome: Progressing   Problem: Activity: Goal: Risk for activity intolerance will decrease Outcome: Progressing   Problem: Nutrition: Goal: Adequate nutrition will be maintained Outcome: Progressing   Problem: Coping: Goal: Level of anxiety will decrease Outcome: Progressing   Problem: Elimination: Goal: Will not experience complications related to bowel motility Outcome: Progressing Goal: Will not experience complications related to urinary retention Outcome: Progressing   Problem: Pain Managment: Goal: General experience of comfort will improve Outcome: Progressing   Problem: Safety: Goal: Ability to remain free from injury will improve Outcome: Progressing   Problem: Skin Integrity: Goal: Risk for impaired skin integrity will decrease Outcome: Progressing   Problem: Education: Goal: Knowledge of the prescribed therapeutic regimen will improve Outcome: Progressing Goal: Understanding of sexual limitations or changes related to disease process or condition will improve Outcome: Progressing Goal: Individualized Educational Video(s) Outcome: Progressing   Problem: Self-Concept: Goal: Communication of feelings regarding changes in body function or  appearance will improve Outcome: Progressing   Problem: Skin Integrity: Goal: Demonstration of wound healing without infection will improve Outcome: Progressing   

## 2020-12-05 NOTE — Progress Notes (Signed)
2 Days Post-Op Procedure(s) (LRB): ATTEMPTED  LAPAROSCOPIC, OPEN ABDOMINAL HYSTERECTOMY WITH BILATERAL SALPINGO-OOPHERECTOMY AND LYSIS OF ADHESIONS (Bilateral) SMALL BOWEL RESECTION  Subjective: Patient reports incisional pain, tolerating PO, + flatus and no problems voiding.    Objective: I have reviewed patient's vital signs, intake and output, medications, labs and pathology.  General: alert GI: soft, mildly tender, dressings C/D/I  Assessment: s/p Procedure(s): ATTEMPTED  LAPAROSCOPIC, OPEN ABDOMINAL HYSTERECTOMY WITH BILATERAL SALPINGO-OOPHERECTOMY AND LYSIS OF ADHESIONS (Bilateral) SMALL BOWEL RESECTION: progressing well. Still in pain but improved, improved PO intake  Plan: Encourage ambulation, will add IV Dilaudid prn when high dose oxycodone not working well enough.  Will restart her Novolog 70/30 with lunch.  Will keep here today, plan for discharge tomorrow  LOS: 2 days    Zenaida Niece 12/05/2020, 7:16 AM

## 2020-12-06 LAB — GLUCOSE, CAPILLARY
Glucose-Capillary: 111 mg/dL — ABNORMAL HIGH (ref 70–99)
Glucose-Capillary: 113 mg/dL — ABNORMAL HIGH (ref 70–99)
Glucose-Capillary: 116 mg/dL — ABNORMAL HIGH (ref 70–99)
Glucose-Capillary: 132 mg/dL — ABNORMAL HIGH (ref 70–99)
Glucose-Capillary: 151 mg/dL — ABNORMAL HIGH (ref 70–99)
Glucose-Capillary: 157 mg/dL — ABNORMAL HIGH (ref 70–99)
Glucose-Capillary: 53 mg/dL — ABNORMAL LOW (ref 70–99)

## 2020-12-06 NOTE — Progress Notes (Signed)
3 Days Post-Op      Subjective: Patient is doing well this a.m. is being discharged today by Dr. Willis Modena.  She is tolerating diet, and will follow-up in his office for staple removal next week.  Objective: Vital signs in last 24 hours: Temp:  [97.7 F (36.5 C)-97.9 F (36.6 C)] 97.8 F (36.6 C) (01/07 0403) Pulse Rate:  [88-101] 91 (01/07 0403) Resp:  [17-18] 18 (01/07 0403) BP: (113-124)/(66-84) 113/75 (01/07 0403) SpO2:  [94 %-100 %] 94 % (01/07 0403)  660 p.o. Voided x2 No BM recorded Afebrile, vital signs are stable. No labs/imaging Intake/Output from previous day: 01/06 0701 - 01/07 0700 In: 660 [P.O.:660] Out: -  Intake/Output this shift: No intake/output data recorded.  General appearance: alert, cooperative and no distress Resp: clear to auscultation bilaterally and anterior Abd:  Soft, tolerating PO's.  Passing flatus no BM so far.  Incision looks fine.  Lab Results:  Recent Labs    12/03/20 1830 12/04/20 0110  WBC  --  15.2*  HGB 8.9* 8.2*  HCT 29.8* 27.5*  PLT  --  399    BMET No results for input(s): NA, K, CL, CO2, GLUCOSE, BUN, CREATININE, CALCIUM in the last 72 hours. PT/INR No results for input(s): LABPROT, INR in the last 72 hours.  No results for input(s): AST, ALT, ALKPHOS, BILITOT, PROT, ALBUMIN in the last 168 hours.   Lipase     Component Value Date/Time   LIPASE 22 08/11/2019 1243     Medications: . acetaminophen  1,000 mg Oral Q6H  . docusate sodium  100 mg Oral BID  . enoxaparin (LOVENOX) injection  40 mg Subcutaneous Q24H  . gabapentin  300 mg Oral TID  . hydrochlorothiazide  25 mg Oral Daily  . ibuprofen  800 mg Oral Q6H  . insulin aspart  0-20 Units Subcutaneous Q4H  . insulin aspart protamine- aspart  36 Units Subcutaneous TID AC  . polyethylene glycol  17 g Oral Daily   Anti-infectives (From admission, onward)   Start     Dose/Rate Route Frequency Ordered Stop   12/03/20 0715  ceFAZolin (ANCEF) 3 g in dextrose 5 %  50 mL IVPB  Status:  Discontinued        3 g 100 mL/hr over 30 Minutes Intravenous On call to O.R. 12/02/20 1126 12/03/20 1435     Assessment/Plan DM HTN Sickle cell anemia Chronic narcotic use   Attempted laparoscopy,Total abdominal hysterectomy, bilateral salpingo-oophorectomy 1/4 Dr. Willis Modena  Small bowel injury S/p small bowel resection with anastomosis 1/4 Dr. Kieth Brightly POD #3 - Nausea improved and bowel function returning. Patient was already advanced to CM diet. Add bowel regimen (miralax, colace). Ok for discharge once tolerating diet and pain controlled on oral regimen. Follow up with [facility] on AVS.  ID - ancef periop FEN - CM diet VTE - SCDs, lovenox Foley - none Follow up -Dr. Willis Modena  Plan: Patient for discharge today follow-up with Dr.Meisinger.  Please call if we can be of further assistance.         LOS: 3 days    Cynthia Rosales 12/06/2020 Please see Amion

## 2020-12-06 NOTE — Discharge Instructions (Signed)
Routine instructions for abdominal hysterectomy 

## 2020-12-06 NOTE — Care Management Important Message (Signed)
Important Message  Patient Details  Name: Cynthia Rosales MRN: [id number] Date of Birth: 09/23/1981   Medicare Important Message Given:  Yes     Gar Glance 12/06/2020, 1:51 PM

## 2020-12-06 NOTE — Progress Notes (Signed)
Cynthia Rosales to be D/C'd  per MD order. Discussed with the patient and all questions fully answered.  VSS, Skin clean, dry and intact without evidence of skin break down, no evidence of skin tears noted.  IV catheter discontinued intact. Site without signs and symptoms of complications. Dressing and pressure applied.  An After Visit Summary was printed and given to the patient. Patient received prescription.  D/c education completed with patient/family including follow up instructions, medication list, d/c activities limitations if indicated, with other d/c instructions as indicated by MD - patient able to verbalize understanding, all questions fully answered.   Patient instructed to return to ED, call 911, or call MD for any changes in condition.   Patient to be escorted via [name], and D/C home via private auto.

## 2020-12-06 NOTE — Plan of Care (Signed)
  Problem: Education: °Goal: Knowledge of General Education information will improve °Description: Including pain rating scale, medication(s)/side effects and non-pharmacologic comfort measures °Outcome: Completed/Met °  °Problem: Health Behavior/Discharge Planning: °Goal: Ability to manage health-related needs will improve °Outcome: Completed/Met °  °Problem: Clinical Measurements: °Goal: Ability to maintain clinical measurements within normal limits will improve °Outcome: Completed/Met °Goal: Will remain free from infection °Outcome: Completed/Met °Goal: Diagnostic test results will improve °Outcome: Completed/Met °Goal: Respiratory complications will improve °Outcome: Completed/Met °Goal: Cardiovascular complication will be avoided °Outcome: Completed/Met °  °Problem: Activity: °Goal: Risk for activity intolerance will decrease °Outcome: Completed/Met °  °Problem: Nutrition: °Goal: Adequate nutrition will be maintained °Outcome: Completed/Met °  °Problem: Coping: °Goal: Level of anxiety will decrease °Outcome: Completed/Met °  °Problem: Elimination: °Goal: Will not experience complications related to bowel motility °Outcome: Completed/Met °Goal: Will not experience complications related to urinary retention °Outcome: Completed/Met °  °Problem: Pain Managment: °Goal: General experience of comfort will improve °Outcome: Completed/Met °  °Problem: Safety: °Goal: Ability to remain free from injury will improve °Outcome: Completed/Met °  °Problem: Skin Integrity: °Goal: Risk for impaired skin integrity will decrease °Outcome: Completed/Met °  °Problem: Education: °Goal: Knowledge of the prescribed therapeutic regimen will improve °Outcome: Completed/Met °Goal: Understanding of sexual limitations or changes related to disease process or condition will improve °Outcome: Completed/Met °Goal: Individualized Educational Video(s) °Outcome: Completed/Met °  °Problem: Self-Concept: °Goal: Communication of feelings regarding  changes in body function or appearance will improve °Outcome: Completed/Met °  °Problem: Skin Integrity: °Goal: Demonstration of wound healing without infection will improve °Outcome: Completed/Met °  °

## 2020-12-06 NOTE — Progress Notes (Signed)
3 Days Post-Op Procedure(s) (LRB): ATTEMPTED  LAPAROSCOPIC, OPEN ABDOMINAL HYSTERECTOMY WITH BILATERAL SALPINGO-OOPHERECTOMY AND LYSIS OF ADHESIONS (Bilateral) SMALL BOWEL RESECTION  Subjective: Patient reports incisional pain, tolerating PO, + flatus and no problems voiding.  Pain improved today  Objective: I have reviewed patient's vital signs.  General: alert GI: soft, mild tenderness, incisions intact  Assessment: s/p Procedure(s): ATTEMPTED  LAPAROSCOPIC, OPEN ABDOMINAL HYSTERECTOMY WITH BILATERAL SALPINGO-OOPHERECTOMY AND LYSIS OF ADHESIONS (Bilateral) SMALL BOWEL RESECTION: progressing well  Plan: Discharge home  LOS: 3 days    Clarene Duke 12/06/2020, 7:47 AM

## 2020-12-07 DIAGNOSIS — E119 Type 2 diabetes mellitus without complications: Secondary | ICD-10-CM | POA: Diagnosis not present

## 2020-12-07 DIAGNOSIS — I10 Essential (primary) hypertension: Secondary | ICD-10-CM | POA: Diagnosis not present

## 2020-12-07 DIAGNOSIS — E1140 Type 2 diabetes mellitus with diabetic neuropathy, unspecified: Secondary | ICD-10-CM | POA: Diagnosis not present

## 2020-12-07 NOTE — Discharge Summary (Signed)
Physician Discharge Summary  Patient ID: Cynthia Rosales MRN: [id number] DOB/AGE: Sep 10, 1981 40 y.o.  Admit date: 12/03/2020 Discharge date: 12/06/2020  Admission Diagnoses:  Symptomatic myomatous uterus  Discharge Diagnoses: Same, small bowel injury Active Problems:   Uterine leiomyoma   S/P abdominal hysterectomy   Discharged Condition: good  Hospital Course: Admitted and underwent attempted laparoscopic hysterectomy-too many adhesions.  Had TAH/BSO, small bowel resection for incidental injury.  Post-op had issues with pain control due to h/o chronic pain and significant opioid use, but met milestones and stable for discharge on POD #3  Consults: general surgery  Discharge Exam: Blood pressure 113/75, pulse 91, temperature 97.8 F (36.6 C), resp. rate 18, height _0  (1.6 m), weight (!) 140.6 kg, SpO2 94 %. General appearance: alert  Disposition: Discharge disposition: 01-Home or Self Care       Discharge Instructions    Call MD for:  extreme fatigue   Complete by: As directed    Call MD for:  persistant dizziness or light-headedness   Complete by: As directed    Call MD for:  persistant nausea and vomiting   Complete by: As directed    Call MD for:  redness, tenderness, or signs of infection (pain, swelling, redness, odor or green/yellow discharge around incision site)   Complete by: As directed    Call MD for:  severe uncontrolled pain   Complete by: As directed    Call MD for:  temperature >100.4   Complete by: As directed    Diet - low sodium heart healthy   Complete by: As directed    Increase activity slowly   Complete by: As directed    Lifting restrictions   Complete by: As directed    10 lbs   Sexual Activity Restrictions   Complete by: As directed    Pelvic rest     Allergies as of 12/06/2020      Reactions   Amoxicillin Hives, Rash, Other (See Comments)   PATIENT HAS HAD A PCN REACTION WITH IMMEDIATE RASH, FACIAL/TONGUE/THROAT SWELLING, SOB, OR  LIGHTHEADEDNESS WITH HYPOTENSION:  #  #  YES  #  #  Has patient had a PCN reaction causing severe rash involving mucus membranes or skin necrosis: No Has patient had a PCN reaction that required hospitalization: No  Has patient had a PCN reaction occurring within the last 10 years: #  #  #  YES  #  #  #  If all of the above answers are "NO", then may proceed with Cephalosporin use.   Buprenorphine Hcl Itching, Nausea Only   Morphine And Related Itching, Nausea Only      Medication List    TAKE these medications   diazepam 5 MG tablet Commonly known as: VALIUM Take 5 mg by mouth 2 (two) times daily as needed for muscle spasms.   hydrochlorothiazide 25 MG tablet Commonly known as: HYDRODIURIL Take 25 mg by mouth daily.   NovoLOG Mix 70/30 FlexPen (70-30) 100 UNIT/ML FlexPen Generic drug: insulin aspart protamine - aspart Inject 36 Units into the skin in the morning, at noon, and at bedtime.   oxycodone 30 MG immediate release tablet Commonly known as: ROXICODONE Take 1 tablet (30 mg total) by mouth every 6 (six) hours as needed for pain. What changed:   how much to take  when to take this   sitaGLIPtin 100 MG tablet Commonly known as: JANUVIA Take 100 mg by mouth daily.       Follow-up Information  Amere Bricco, MD. Schedule an appointment as soon as possible for a visit in 4 day(s).   Specialty: Obstetrics and Gynecology Why: for staple removal Contact information: [address] [place] [address] [phone]               Signed: Blane Ohara Brenyn Petrey 12/07/2020, 9:15 AM

## 2020-12-08 DIAGNOSIS — E119 Type 2 diabetes mellitus without complications: Secondary | ICD-10-CM | POA: Diagnosis not present

## 2020-12-08 DIAGNOSIS — I10 Essential (primary) hypertension: Secondary | ICD-10-CM | POA: Diagnosis not present

## 2020-12-08 DIAGNOSIS — E1140 Type 2 diabetes mellitus with diabetic neuropathy, unspecified: Secondary | ICD-10-CM | POA: Diagnosis not present

## 2020-12-09 ENCOUNTER — Telehealth: Payer: Self-pay

## 2020-12-09 DIAGNOSIS — E1140 Type 2 diabetes mellitus with diabetic neuropathy, unspecified: Secondary | ICD-10-CM | POA: Diagnosis not present

## 2020-12-09 DIAGNOSIS — E119 Type 2 diabetes mellitus without complications: Secondary | ICD-10-CM | POA: Diagnosis not present

## 2020-12-09 DIAGNOSIS — I10 Essential (primary) hypertension: Secondary | ICD-10-CM | POA: Diagnosis not present

## 2020-12-09 NOTE — Telephone Encounter (Signed)
Transition Care Management Follow-up Telephone Call Date of discharge and from where: 12/06/2020, [facility]  Call placed to patient, she said that she has her post op appointment scheduled with OB/GYN  12/11/2020.  She also noted that Juluis Mire, NP is no longer her PCP, she follows up with Dr Jonelle Sidle.  Juluis Mire, NP removed as PCP in [place],

## 2020-12-10 DIAGNOSIS — I10 Essential (primary) hypertension: Secondary | ICD-10-CM | POA: Diagnosis not present

## 2020-12-10 DIAGNOSIS — E1140 Type 2 diabetes mellitus with diabetic neuropathy, unspecified: Secondary | ICD-10-CM | POA: Diagnosis not present

## 2020-12-10 DIAGNOSIS — E119 Type 2 diabetes mellitus without complications: Secondary | ICD-10-CM | POA: Diagnosis not present

## 2020-12-11 DIAGNOSIS — I10 Essential (primary) hypertension: Secondary | ICD-10-CM | POA: Diagnosis not present

## 2020-12-11 DIAGNOSIS — E1140 Type 2 diabetes mellitus with diabetic neuropathy, unspecified: Secondary | ICD-10-CM | POA: Diagnosis not present

## 2020-12-11 DIAGNOSIS — E119 Type 2 diabetes mellitus without complications: Secondary | ICD-10-CM | POA: Diagnosis not present

## 2020-12-12 DIAGNOSIS — I10 Essential (primary) hypertension: Secondary | ICD-10-CM | POA: Diagnosis not present

## 2020-12-12 DIAGNOSIS — E119 Type 2 diabetes mellitus without complications: Secondary | ICD-10-CM | POA: Diagnosis not present

## 2020-12-12 DIAGNOSIS — E1140 Type 2 diabetes mellitus with diabetic neuropathy, unspecified: Secondary | ICD-10-CM | POA: Diagnosis not present

## 2020-12-13 DIAGNOSIS — E119 Type 2 diabetes mellitus without complications: Secondary | ICD-10-CM | POA: Diagnosis not present

## 2020-12-13 DIAGNOSIS — I10 Essential (primary) hypertension: Secondary | ICD-10-CM | POA: Diagnosis not present

## 2020-12-13 DIAGNOSIS — E1140 Type 2 diabetes mellitus with diabetic neuropathy, unspecified: Secondary | ICD-10-CM | POA: Diagnosis not present

## 2020-12-14 DIAGNOSIS — E119 Type 2 diabetes mellitus without complications: Secondary | ICD-10-CM | POA: Diagnosis not present

## 2020-12-14 DIAGNOSIS — I10 Essential (primary) hypertension: Secondary | ICD-10-CM | POA: Diagnosis not present

## 2020-12-14 DIAGNOSIS — E1140 Type 2 diabetes mellitus with diabetic neuropathy, unspecified: Secondary | ICD-10-CM | POA: Diagnosis not present

## 2020-12-15 DIAGNOSIS — E1140 Type 2 diabetes mellitus with diabetic neuropathy, unspecified: Secondary | ICD-10-CM | POA: Diagnosis not present

## 2020-12-15 DIAGNOSIS — E119 Type 2 diabetes mellitus without complications: Secondary | ICD-10-CM | POA: Diagnosis not present

## 2020-12-15 DIAGNOSIS — I10 Essential (primary) hypertension: Secondary | ICD-10-CM | POA: Diagnosis not present

## 2020-12-16 DIAGNOSIS — E119 Type 2 diabetes mellitus without complications: Secondary | ICD-10-CM | POA: Diagnosis not present

## 2020-12-16 DIAGNOSIS — I10 Essential (primary) hypertension: Secondary | ICD-10-CM | POA: Diagnosis not present

## 2020-12-16 DIAGNOSIS — E1140 Type 2 diabetes mellitus with diabetic neuropathy, unspecified: Secondary | ICD-10-CM | POA: Diagnosis not present

## 2020-12-17 DIAGNOSIS — E119 Type 2 diabetes mellitus without complications: Secondary | ICD-10-CM | POA: Diagnosis not present

## 2020-12-17 DIAGNOSIS — E1140 Type 2 diabetes mellitus with diabetic neuropathy, unspecified: Secondary | ICD-10-CM | POA: Diagnosis not present

## 2020-12-17 DIAGNOSIS — I10 Essential (primary) hypertension: Secondary | ICD-10-CM | POA: Diagnosis not present

## 2020-12-18 DIAGNOSIS — E119 Type 2 diabetes mellitus without complications: Secondary | ICD-10-CM | POA: Diagnosis not present

## 2020-12-18 DIAGNOSIS — E1140 Type 2 diabetes mellitus with diabetic neuropathy, unspecified: Secondary | ICD-10-CM | POA: Diagnosis not present

## 2020-12-18 DIAGNOSIS — I10 Essential (primary) hypertension: Secondary | ICD-10-CM | POA: Diagnosis not present

## 2020-12-19 DIAGNOSIS — E119 Type 2 diabetes mellitus without complications: Secondary | ICD-10-CM | POA: Diagnosis not present

## 2020-12-19 DIAGNOSIS — I10 Essential (primary) hypertension: Secondary | ICD-10-CM | POA: Diagnosis not present

## 2020-12-19 DIAGNOSIS — E1140 Type 2 diabetes mellitus with diabetic neuropathy, unspecified: Secondary | ICD-10-CM | POA: Diagnosis not present

## 2020-12-20 DIAGNOSIS — E119 Type 2 diabetes mellitus without complications: Secondary | ICD-10-CM | POA: Diagnosis not present

## 2020-12-20 DIAGNOSIS — I10 Essential (primary) hypertension: Secondary | ICD-10-CM | POA: Diagnosis not present

## 2020-12-20 DIAGNOSIS — E1140 Type 2 diabetes mellitus with diabetic neuropathy, unspecified: Secondary | ICD-10-CM | POA: Diagnosis not present

## 2020-12-21 DIAGNOSIS — E119 Type 2 diabetes mellitus without complications: Secondary | ICD-10-CM | POA: Diagnosis not present

## 2020-12-21 DIAGNOSIS — E1140 Type 2 diabetes mellitus with diabetic neuropathy, unspecified: Secondary | ICD-10-CM | POA: Diagnosis not present

## 2020-12-21 DIAGNOSIS — I10 Essential (primary) hypertension: Secondary | ICD-10-CM | POA: Diagnosis not present

## 2020-12-22 DIAGNOSIS — E1140 Type 2 diabetes mellitus with diabetic neuropathy, unspecified: Secondary | ICD-10-CM | POA: Diagnosis not present

## 2020-12-22 DIAGNOSIS — I10 Essential (primary) hypertension: Secondary | ICD-10-CM | POA: Diagnosis not present

## 2020-12-22 DIAGNOSIS — E119 Type 2 diabetes mellitus without complications: Secondary | ICD-10-CM | POA: Diagnosis not present

## 2020-12-23 DIAGNOSIS — I10 Essential (primary) hypertension: Secondary | ICD-10-CM | POA: Diagnosis not present

## 2020-12-23 DIAGNOSIS — E1140 Type 2 diabetes mellitus with diabetic neuropathy, unspecified: Secondary | ICD-10-CM | POA: Diagnosis not present

## 2020-12-23 DIAGNOSIS — E119 Type 2 diabetes mellitus without complications: Secondary | ICD-10-CM | POA: Diagnosis not present

## 2020-12-24 DIAGNOSIS — I10 Essential (primary) hypertension: Secondary | ICD-10-CM | POA: Diagnosis not present

## 2020-12-24 DIAGNOSIS — E119 Type 2 diabetes mellitus without complications: Secondary | ICD-10-CM | POA: Diagnosis not present

## 2020-12-24 DIAGNOSIS — E1140 Type 2 diabetes mellitus with diabetic neuropathy, unspecified: Secondary | ICD-10-CM | POA: Diagnosis not present

## 2020-12-25 DIAGNOSIS — E1140 Type 2 diabetes mellitus with diabetic neuropathy, unspecified: Secondary | ICD-10-CM | POA: Diagnosis not present

## 2020-12-25 DIAGNOSIS — I10 Essential (primary) hypertension: Secondary | ICD-10-CM | POA: Diagnosis not present

## 2020-12-25 DIAGNOSIS — E119 Type 2 diabetes mellitus without complications: Secondary | ICD-10-CM | POA: Diagnosis not present

## 2020-12-26 DIAGNOSIS — E119 Type 2 diabetes mellitus without complications: Secondary | ICD-10-CM | POA: Diagnosis not present

## 2020-12-26 DIAGNOSIS — I10 Essential (primary) hypertension: Secondary | ICD-10-CM | POA: Diagnosis not present

## 2020-12-26 DIAGNOSIS — E1140 Type 2 diabetes mellitus with diabetic neuropathy, unspecified: Secondary | ICD-10-CM | POA: Diagnosis not present

## 2020-12-27 DIAGNOSIS — I10 Essential (primary) hypertension: Secondary | ICD-10-CM | POA: Diagnosis not present

## 2020-12-27 DIAGNOSIS — E1140 Type 2 diabetes mellitus with diabetic neuropathy, unspecified: Secondary | ICD-10-CM | POA: Diagnosis not present

## 2020-12-27 DIAGNOSIS — E119 Type 2 diabetes mellitus without complications: Secondary | ICD-10-CM | POA: Diagnosis not present

## 2020-12-30 DIAGNOSIS — E119 Type 2 diabetes mellitus without complications: Secondary | ICD-10-CM | POA: Diagnosis not present

## 2020-12-30 DIAGNOSIS — E1140 Type 2 diabetes mellitus with diabetic neuropathy, unspecified: Secondary | ICD-10-CM | POA: Diagnosis not present

## 2020-12-30 DIAGNOSIS — Z4889 Encounter for other specified surgical aftercare: Secondary | ICD-10-CM | POA: Diagnosis not present

## 2020-12-30 DIAGNOSIS — I10 Essential (primary) hypertension: Secondary | ICD-10-CM | POA: Diagnosis not present

## 2020-12-30 DIAGNOSIS — D571 Sickle-cell disease without crisis: Secondary | ICD-10-CM | POA: Diagnosis not present

## 2020-12-30 DIAGNOSIS — G894 Chronic pain syndrome: Secondary | ICD-10-CM | POA: Diagnosis not present

## 2021-01-01 DIAGNOSIS — E1140 Type 2 diabetes mellitus with diabetic neuropathy, unspecified: Secondary | ICD-10-CM | POA: Diagnosis not present

## 2021-01-01 DIAGNOSIS — I10 Essential (primary) hypertension: Secondary | ICD-10-CM | POA: Diagnosis not present

## 2021-01-01 DIAGNOSIS — E119 Type 2 diabetes mellitus without complications: Secondary | ICD-10-CM | POA: Diagnosis not present

## 2021-01-02 DIAGNOSIS — E119 Type 2 diabetes mellitus without complications: Secondary | ICD-10-CM | POA: Diagnosis not present

## 2021-01-02 DIAGNOSIS — I10 Essential (primary) hypertension: Secondary | ICD-10-CM | POA: Diagnosis not present

## 2021-01-02 DIAGNOSIS — E1140 Type 2 diabetes mellitus with diabetic neuropathy, unspecified: Secondary | ICD-10-CM | POA: Diagnosis not present

## 2021-01-03 DIAGNOSIS — E119 Type 2 diabetes mellitus without complications: Secondary | ICD-10-CM | POA: Diagnosis not present

## 2021-01-03 DIAGNOSIS — I10 Essential (primary) hypertension: Secondary | ICD-10-CM | POA: Diagnosis not present

## 2021-01-03 DIAGNOSIS — E1140 Type 2 diabetes mellitus with diabetic neuropathy, unspecified: Secondary | ICD-10-CM | POA: Diagnosis not present

## 2021-01-06 DIAGNOSIS — E119 Type 2 diabetes mellitus without complications: Secondary | ICD-10-CM | POA: Diagnosis not present

## 2021-01-06 DIAGNOSIS — E1140 Type 2 diabetes mellitus with diabetic neuropathy, unspecified: Secondary | ICD-10-CM | POA: Diagnosis not present

## 2021-01-06 DIAGNOSIS — I10 Essential (primary) hypertension: Secondary | ICD-10-CM | POA: Diagnosis not present

## 2021-01-07 DIAGNOSIS — E1140 Type 2 diabetes mellitus with diabetic neuropathy, unspecified: Secondary | ICD-10-CM | POA: Diagnosis not present

## 2021-01-07 DIAGNOSIS — E119 Type 2 diabetes mellitus without complications: Secondary | ICD-10-CM | POA: Diagnosis not present

## 2021-01-07 DIAGNOSIS — I10 Essential (primary) hypertension: Secondary | ICD-10-CM | POA: Diagnosis not present

## 2021-01-08 DIAGNOSIS — E1140 Type 2 diabetes mellitus with diabetic neuropathy, unspecified: Secondary | ICD-10-CM | POA: Diagnosis not present

## 2021-01-08 DIAGNOSIS — E119 Type 2 diabetes mellitus without complications: Secondary | ICD-10-CM | POA: Diagnosis not present

## 2021-01-08 DIAGNOSIS — I10 Essential (primary) hypertension: Secondary | ICD-10-CM | POA: Diagnosis not present

## 2021-01-09 DIAGNOSIS — E119 Type 2 diabetes mellitus without complications: Secondary | ICD-10-CM | POA: Diagnosis not present

## 2021-01-09 DIAGNOSIS — E1140 Type 2 diabetes mellitus with diabetic neuropathy, unspecified: Secondary | ICD-10-CM | POA: Diagnosis not present

## 2021-01-09 DIAGNOSIS — I10 Essential (primary) hypertension: Secondary | ICD-10-CM | POA: Diagnosis not present

## 2021-01-10 DIAGNOSIS — I10 Essential (primary) hypertension: Secondary | ICD-10-CM | POA: Diagnosis not present

## 2021-01-10 DIAGNOSIS — E1140 Type 2 diabetes mellitus with diabetic neuropathy, unspecified: Secondary | ICD-10-CM | POA: Diagnosis not present

## 2021-01-10 DIAGNOSIS — E119 Type 2 diabetes mellitus without complications: Secondary | ICD-10-CM | POA: Diagnosis not present

## 2021-01-11 DIAGNOSIS — E1140 Type 2 diabetes mellitus with diabetic neuropathy, unspecified: Secondary | ICD-10-CM | POA: Diagnosis not present

## 2021-01-11 DIAGNOSIS — I10 Essential (primary) hypertension: Secondary | ICD-10-CM | POA: Diagnosis not present

## 2021-01-11 DIAGNOSIS — E119 Type 2 diabetes mellitus without complications: Secondary | ICD-10-CM | POA: Diagnosis not present

## 2021-01-12 DIAGNOSIS — I10 Essential (primary) hypertension: Secondary | ICD-10-CM | POA: Diagnosis not present

## 2021-01-12 DIAGNOSIS — E1140 Type 2 diabetes mellitus with diabetic neuropathy, unspecified: Secondary | ICD-10-CM | POA: Diagnosis not present

## 2021-01-12 DIAGNOSIS — E119 Type 2 diabetes mellitus without complications: Secondary | ICD-10-CM | POA: Diagnosis not present

## 2021-01-13 DIAGNOSIS — I10 Essential (primary) hypertension: Secondary | ICD-10-CM | POA: Diagnosis not present

## 2021-01-13 DIAGNOSIS — E1140 Type 2 diabetes mellitus with diabetic neuropathy, unspecified: Secondary | ICD-10-CM | POA: Diagnosis not present

## 2021-01-13 DIAGNOSIS — E119 Type 2 diabetes mellitus without complications: Secondary | ICD-10-CM | POA: Diagnosis not present

## 2021-01-14 DIAGNOSIS — E1140 Type 2 diabetes mellitus with diabetic neuropathy, unspecified: Secondary | ICD-10-CM | POA: Diagnosis not present

## 2021-01-14 DIAGNOSIS — I10 Essential (primary) hypertension: Secondary | ICD-10-CM | POA: Diagnosis not present

## 2021-01-14 DIAGNOSIS — E119 Type 2 diabetes mellitus without complications: Secondary | ICD-10-CM | POA: Diagnosis not present

## 2021-01-15 DIAGNOSIS — I10 Essential (primary) hypertension: Secondary | ICD-10-CM | POA: Diagnosis not present

## 2021-01-15 DIAGNOSIS — E119 Type 2 diabetes mellitus without complications: Secondary | ICD-10-CM | POA: Diagnosis not present

## 2021-01-15 DIAGNOSIS — E1140 Type 2 diabetes mellitus with diabetic neuropathy, unspecified: Secondary | ICD-10-CM | POA: Diagnosis not present

## 2021-01-16 DIAGNOSIS — E119 Type 2 diabetes mellitus without complications: Secondary | ICD-10-CM | POA: Diagnosis not present

## 2021-01-16 DIAGNOSIS — I10 Essential (primary) hypertension: Secondary | ICD-10-CM | POA: Diagnosis not present

## 2021-01-16 DIAGNOSIS — E1140 Type 2 diabetes mellitus with diabetic neuropathy, unspecified: Secondary | ICD-10-CM | POA: Diagnosis not present

## 2021-01-17 DIAGNOSIS — E1140 Type 2 diabetes mellitus with diabetic neuropathy, unspecified: Secondary | ICD-10-CM | POA: Diagnosis not present

## 2021-01-17 DIAGNOSIS — I10 Essential (primary) hypertension: Secondary | ICD-10-CM | POA: Diagnosis not present

## 2021-01-17 DIAGNOSIS — E119 Type 2 diabetes mellitus without complications: Secondary | ICD-10-CM | POA: Diagnosis not present

## 2021-01-18 DIAGNOSIS — I10 Essential (primary) hypertension: Secondary | ICD-10-CM | POA: Diagnosis not present

## 2021-01-18 DIAGNOSIS — E119 Type 2 diabetes mellitus without complications: Secondary | ICD-10-CM | POA: Diagnosis not present

## 2021-01-18 DIAGNOSIS — E1140 Type 2 diabetes mellitus with diabetic neuropathy, unspecified: Secondary | ICD-10-CM | POA: Diagnosis not present

## 2021-01-19 DIAGNOSIS — E1140 Type 2 diabetes mellitus with diabetic neuropathy, unspecified: Secondary | ICD-10-CM | POA: Diagnosis not present

## 2021-01-19 DIAGNOSIS — E119 Type 2 diabetes mellitus without complications: Secondary | ICD-10-CM | POA: Diagnosis not present

## 2021-01-19 DIAGNOSIS — I10 Essential (primary) hypertension: Secondary | ICD-10-CM | POA: Diagnosis not present

## 2021-01-20 DIAGNOSIS — E1140 Type 2 diabetes mellitus with diabetic neuropathy, unspecified: Secondary | ICD-10-CM | POA: Diagnosis not present

## 2021-01-20 DIAGNOSIS — I10 Essential (primary) hypertension: Secondary | ICD-10-CM | POA: Diagnosis not present

## 2021-01-20 DIAGNOSIS — E119 Type 2 diabetes mellitus without complications: Secondary | ICD-10-CM | POA: Diagnosis not present

## 2021-01-21 DIAGNOSIS — E1140 Type 2 diabetes mellitus with diabetic neuropathy, unspecified: Secondary | ICD-10-CM | POA: Diagnosis not present

## 2021-01-21 DIAGNOSIS — E119 Type 2 diabetes mellitus without complications: Secondary | ICD-10-CM | POA: Diagnosis not present

## 2021-01-21 DIAGNOSIS — I10 Essential (primary) hypertension: Secondary | ICD-10-CM | POA: Diagnosis not present

## 2021-01-22 DIAGNOSIS — E1140 Type 2 diabetes mellitus with diabetic neuropathy, unspecified: Secondary | ICD-10-CM | POA: Diagnosis not present

## 2021-01-22 DIAGNOSIS — E119 Type 2 diabetes mellitus without complications: Secondary | ICD-10-CM | POA: Diagnosis not present

## 2021-01-22 DIAGNOSIS — I10 Essential (primary) hypertension: Secondary | ICD-10-CM | POA: Diagnosis not present

## 2021-01-23 DIAGNOSIS — I10 Essential (primary) hypertension: Secondary | ICD-10-CM | POA: Diagnosis not present

## 2021-01-23 DIAGNOSIS — E1140 Type 2 diabetes mellitus with diabetic neuropathy, unspecified: Secondary | ICD-10-CM | POA: Diagnosis not present

## 2021-01-23 DIAGNOSIS — E119 Type 2 diabetes mellitus without complications: Secondary | ICD-10-CM | POA: Diagnosis not present

## 2021-01-24 DIAGNOSIS — E1140 Type 2 diabetes mellitus with diabetic neuropathy, unspecified: Secondary | ICD-10-CM | POA: Diagnosis not present

## 2021-01-24 DIAGNOSIS — I10 Essential (primary) hypertension: Secondary | ICD-10-CM | POA: Diagnosis not present

## 2021-01-24 DIAGNOSIS — E119 Type 2 diabetes mellitus without complications: Secondary | ICD-10-CM | POA: Diagnosis not present

## 2021-01-25 DIAGNOSIS — E119 Type 2 diabetes mellitus without complications: Secondary | ICD-10-CM | POA: Diagnosis not present

## 2021-01-25 DIAGNOSIS — I10 Essential (primary) hypertension: Secondary | ICD-10-CM | POA: Diagnosis not present

## 2021-01-25 DIAGNOSIS — E1140 Type 2 diabetes mellitus with diabetic neuropathy, unspecified: Secondary | ICD-10-CM | POA: Diagnosis not present

## 2021-01-26 DIAGNOSIS — E1140 Type 2 diabetes mellitus with diabetic neuropathy, unspecified: Secondary | ICD-10-CM | POA: Diagnosis not present

## 2021-01-26 DIAGNOSIS — I10 Essential (primary) hypertension: Secondary | ICD-10-CM | POA: Diagnosis not present

## 2021-01-26 DIAGNOSIS — E119 Type 2 diabetes mellitus without complications: Secondary | ICD-10-CM | POA: Diagnosis not present

## 2021-01-27 DIAGNOSIS — E1140 Type 2 diabetes mellitus with diabetic neuropathy, unspecified: Secondary | ICD-10-CM | POA: Diagnosis not present

## 2021-01-27 DIAGNOSIS — E119 Type 2 diabetes mellitus without complications: Secondary | ICD-10-CM | POA: Diagnosis not present

## 2021-01-27 DIAGNOSIS — I10 Essential (primary) hypertension: Secondary | ICD-10-CM | POA: Diagnosis not present

## 2021-01-28 DIAGNOSIS — E119 Type 2 diabetes mellitus without complications: Secondary | ICD-10-CM | POA: Diagnosis not present

## 2021-01-28 DIAGNOSIS — I10 Essential (primary) hypertension: Secondary | ICD-10-CM | POA: Diagnosis not present

## 2021-01-28 DIAGNOSIS — E1140 Type 2 diabetes mellitus with diabetic neuropathy, unspecified: Secondary | ICD-10-CM | POA: Diagnosis not present

## 2021-01-29 DIAGNOSIS — E1140 Type 2 diabetes mellitus with diabetic neuropathy, unspecified: Secondary | ICD-10-CM | POA: Diagnosis not present

## 2021-01-29 DIAGNOSIS — E119 Type 2 diabetes mellitus without complications: Secondary | ICD-10-CM | POA: Diagnosis not present

## 2021-01-29 DIAGNOSIS — I10 Essential (primary) hypertension: Secondary | ICD-10-CM | POA: Diagnosis not present

## 2021-01-30 DIAGNOSIS — E119 Type 2 diabetes mellitus without complications: Secondary | ICD-10-CM | POA: Diagnosis not present

## 2021-01-30 DIAGNOSIS — E1140 Type 2 diabetes mellitus with diabetic neuropathy, unspecified: Secondary | ICD-10-CM | POA: Diagnosis not present

## 2021-01-30 DIAGNOSIS — I10 Essential (primary) hypertension: Secondary | ICD-10-CM | POA: Diagnosis not present

## 2021-01-31 DIAGNOSIS — E119 Type 2 diabetes mellitus without complications: Secondary | ICD-10-CM | POA: Diagnosis not present

## 2021-01-31 DIAGNOSIS — I10 Essential (primary) hypertension: Secondary | ICD-10-CM | POA: Diagnosis not present

## 2021-01-31 DIAGNOSIS — E1140 Type 2 diabetes mellitus with diabetic neuropathy, unspecified: Secondary | ICD-10-CM | POA: Diagnosis not present

## 2021-02-01 DIAGNOSIS — I10 Essential (primary) hypertension: Secondary | ICD-10-CM | POA: Diagnosis not present

## 2021-02-01 DIAGNOSIS — E119 Type 2 diabetes mellitus without complications: Secondary | ICD-10-CM | POA: Diagnosis not present

## 2021-02-01 DIAGNOSIS — E1140 Type 2 diabetes mellitus with diabetic neuropathy, unspecified: Secondary | ICD-10-CM | POA: Diagnosis not present

## 2021-02-02 DIAGNOSIS — E1140 Type 2 diabetes mellitus with diabetic neuropathy, unspecified: Secondary | ICD-10-CM | POA: Diagnosis not present

## 2021-02-02 DIAGNOSIS — E119 Type 2 diabetes mellitus without complications: Secondary | ICD-10-CM | POA: Diagnosis not present

## 2021-02-02 DIAGNOSIS — I10 Essential (primary) hypertension: Secondary | ICD-10-CM | POA: Diagnosis not present

## 2021-02-03 DIAGNOSIS — E119 Type 2 diabetes mellitus without complications: Secondary | ICD-10-CM | POA: Diagnosis not present

## 2021-02-03 DIAGNOSIS — I10 Essential (primary) hypertension: Secondary | ICD-10-CM | POA: Diagnosis not present

## 2021-02-03 DIAGNOSIS — E1140 Type 2 diabetes mellitus with diabetic neuropathy, unspecified: Secondary | ICD-10-CM | POA: Diagnosis not present

## 2021-02-04 DIAGNOSIS — E1140 Type 2 diabetes mellitus with diabetic neuropathy, unspecified: Secondary | ICD-10-CM | POA: Diagnosis not present

## 2021-02-04 DIAGNOSIS — I10 Essential (primary) hypertension: Secondary | ICD-10-CM | POA: Diagnosis not present

## 2021-02-04 DIAGNOSIS — E119 Type 2 diabetes mellitus without complications: Secondary | ICD-10-CM | POA: Diagnosis not present

## 2021-02-05 DIAGNOSIS — E119 Type 2 diabetes mellitus without complications: Secondary | ICD-10-CM | POA: Diagnosis not present

## 2021-02-05 DIAGNOSIS — I10 Essential (primary) hypertension: Secondary | ICD-10-CM | POA: Diagnosis not present

## 2021-02-05 DIAGNOSIS — E1140 Type 2 diabetes mellitus with diabetic neuropathy, unspecified: Secondary | ICD-10-CM | POA: Diagnosis not present

## 2021-02-06 DIAGNOSIS — I10 Essential (primary) hypertension: Secondary | ICD-10-CM | POA: Diagnosis not present

## 2021-02-06 DIAGNOSIS — E1140 Type 2 diabetes mellitus with diabetic neuropathy, unspecified: Secondary | ICD-10-CM | POA: Diagnosis not present

## 2021-02-06 DIAGNOSIS — E119 Type 2 diabetes mellitus without complications: Secondary | ICD-10-CM | POA: Diagnosis not present

## 2021-02-07 DIAGNOSIS — E119 Type 2 diabetes mellitus without complications: Secondary | ICD-10-CM | POA: Diagnosis not present

## 2021-02-07 DIAGNOSIS — I10 Essential (primary) hypertension: Secondary | ICD-10-CM | POA: Diagnosis not present

## 2021-02-07 DIAGNOSIS — E1140 Type 2 diabetes mellitus with diabetic neuropathy, unspecified: Secondary | ICD-10-CM | POA: Diagnosis not present

## 2021-02-08 DIAGNOSIS — E1140 Type 2 diabetes mellitus with diabetic neuropathy, unspecified: Secondary | ICD-10-CM | POA: Diagnosis not present

## 2021-02-08 DIAGNOSIS — I10 Essential (primary) hypertension: Secondary | ICD-10-CM | POA: Diagnosis not present

## 2021-02-08 DIAGNOSIS — E119 Type 2 diabetes mellitus without complications: Secondary | ICD-10-CM | POA: Diagnosis not present

## 2021-02-09 DIAGNOSIS — E119 Type 2 diabetes mellitus without complications: Secondary | ICD-10-CM | POA: Diagnosis not present

## 2021-02-09 DIAGNOSIS — E1140 Type 2 diabetes mellitus with diabetic neuropathy, unspecified: Secondary | ICD-10-CM | POA: Diagnosis not present

## 2021-02-09 DIAGNOSIS — I10 Essential (primary) hypertension: Secondary | ICD-10-CM | POA: Diagnosis not present

## 2021-02-10 DIAGNOSIS — I10 Essential (primary) hypertension: Secondary | ICD-10-CM | POA: Diagnosis not present

## 2021-02-10 DIAGNOSIS — E1140 Type 2 diabetes mellitus with diabetic neuropathy, unspecified: Secondary | ICD-10-CM | POA: Diagnosis not present

## 2021-02-10 DIAGNOSIS — E119 Type 2 diabetes mellitus without complications: Secondary | ICD-10-CM | POA: Diagnosis not present

## 2021-02-11 DIAGNOSIS — I10 Essential (primary) hypertension: Secondary | ICD-10-CM | POA: Diagnosis not present

## 2021-02-11 DIAGNOSIS — E1140 Type 2 diabetes mellitus with diabetic neuropathy, unspecified: Secondary | ICD-10-CM | POA: Diagnosis not present

## 2021-02-11 DIAGNOSIS — E119 Type 2 diabetes mellitus without complications: Secondary | ICD-10-CM | POA: Diagnosis not present

## 2021-02-12 DIAGNOSIS — E1140 Type 2 diabetes mellitus with diabetic neuropathy, unspecified: Secondary | ICD-10-CM | POA: Diagnosis not present

## 2021-02-12 DIAGNOSIS — I10 Essential (primary) hypertension: Secondary | ICD-10-CM | POA: Diagnosis not present

## 2021-02-12 DIAGNOSIS — E119 Type 2 diabetes mellitus without complications: Secondary | ICD-10-CM | POA: Diagnosis not present

## 2021-02-13 DIAGNOSIS — E1140 Type 2 diabetes mellitus with diabetic neuropathy, unspecified: Secondary | ICD-10-CM | POA: Diagnosis not present

## 2021-02-13 DIAGNOSIS — I10 Essential (primary) hypertension: Secondary | ICD-10-CM | POA: Diagnosis not present

## 2021-02-13 DIAGNOSIS — E119 Type 2 diabetes mellitus without complications: Secondary | ICD-10-CM | POA: Diagnosis not present

## 2021-02-14 DIAGNOSIS — E1140 Type 2 diabetes mellitus with diabetic neuropathy, unspecified: Secondary | ICD-10-CM | POA: Diagnosis not present

## 2021-02-14 DIAGNOSIS — I10 Essential (primary) hypertension: Secondary | ICD-10-CM | POA: Diagnosis not present

## 2021-02-14 DIAGNOSIS — E119 Type 2 diabetes mellitus without complications: Secondary | ICD-10-CM | POA: Diagnosis not present

## 2021-02-15 DIAGNOSIS — E119 Type 2 diabetes mellitus without complications: Secondary | ICD-10-CM | POA: Diagnosis not present

## 2021-02-15 DIAGNOSIS — E1140 Type 2 diabetes mellitus with diabetic neuropathy, unspecified: Secondary | ICD-10-CM | POA: Diagnosis not present

## 2021-02-15 DIAGNOSIS — I10 Essential (primary) hypertension: Secondary | ICD-10-CM | POA: Diagnosis not present

## 2021-02-16 DIAGNOSIS — I10 Essential (primary) hypertension: Secondary | ICD-10-CM | POA: Diagnosis not present

## 2021-02-16 DIAGNOSIS — E119 Type 2 diabetes mellitus without complications: Secondary | ICD-10-CM | POA: Diagnosis not present

## 2021-02-16 DIAGNOSIS — E1140 Type 2 diabetes mellitus with diabetic neuropathy, unspecified: Secondary | ICD-10-CM | POA: Diagnosis not present

## 2021-02-17 DIAGNOSIS — I10 Essential (primary) hypertension: Secondary | ICD-10-CM | POA: Diagnosis not present

## 2021-02-17 DIAGNOSIS — E1140 Type 2 diabetes mellitus with diabetic neuropathy, unspecified: Secondary | ICD-10-CM | POA: Diagnosis not present

## 2021-02-17 DIAGNOSIS — E119 Type 2 diabetes mellitus without complications: Secondary | ICD-10-CM | POA: Diagnosis not present

## 2021-02-18 DIAGNOSIS — E1140 Type 2 diabetes mellitus with diabetic neuropathy, unspecified: Secondary | ICD-10-CM | POA: Diagnosis not present

## 2021-02-18 DIAGNOSIS — I10 Essential (primary) hypertension: Secondary | ICD-10-CM | POA: Diagnosis not present

## 2021-02-18 DIAGNOSIS — E119 Type 2 diabetes mellitus without complications: Secondary | ICD-10-CM | POA: Diagnosis not present

## 2021-02-19 DIAGNOSIS — I10 Essential (primary) hypertension: Secondary | ICD-10-CM | POA: Diagnosis not present

## 2021-02-19 DIAGNOSIS — E1140 Type 2 diabetes mellitus with diabetic neuropathy, unspecified: Secondary | ICD-10-CM | POA: Diagnosis not present

## 2021-02-19 DIAGNOSIS — E119 Type 2 diabetes mellitus without complications: Secondary | ICD-10-CM | POA: Diagnosis not present

## 2021-02-20 DIAGNOSIS — E1140 Type 2 diabetes mellitus with diabetic neuropathy, unspecified: Secondary | ICD-10-CM | POA: Diagnosis not present

## 2021-02-20 DIAGNOSIS — I10 Essential (primary) hypertension: Secondary | ICD-10-CM | POA: Diagnosis not present

## 2021-02-20 DIAGNOSIS — E119 Type 2 diabetes mellitus without complications: Secondary | ICD-10-CM | POA: Diagnosis not present

## 2021-02-21 DIAGNOSIS — E119 Type 2 diabetes mellitus without complications: Secondary | ICD-10-CM | POA: Diagnosis not present

## 2021-02-21 DIAGNOSIS — I10 Essential (primary) hypertension: Secondary | ICD-10-CM | POA: Diagnosis not present

## 2021-02-21 DIAGNOSIS — E1140 Type 2 diabetes mellitus with diabetic neuropathy, unspecified: Secondary | ICD-10-CM | POA: Diagnosis not present

## 2021-02-22 DIAGNOSIS — E119 Type 2 diabetes mellitus without complications: Secondary | ICD-10-CM | POA: Diagnosis not present

## 2021-02-22 DIAGNOSIS — I10 Essential (primary) hypertension: Secondary | ICD-10-CM | POA: Diagnosis not present

## 2021-02-22 DIAGNOSIS — E1140 Type 2 diabetes mellitus with diabetic neuropathy, unspecified: Secondary | ICD-10-CM | POA: Diagnosis not present

## 2021-02-23 DIAGNOSIS — E119 Type 2 diabetes mellitus without complications: Secondary | ICD-10-CM | POA: Diagnosis not present

## 2021-02-23 DIAGNOSIS — I10 Essential (primary) hypertension: Secondary | ICD-10-CM | POA: Diagnosis not present

## 2021-02-23 DIAGNOSIS — E1140 Type 2 diabetes mellitus with diabetic neuropathy, unspecified: Secondary | ICD-10-CM | POA: Diagnosis not present

## 2021-02-24 DIAGNOSIS — E119 Type 2 diabetes mellitus without complications: Secondary | ICD-10-CM | POA: Diagnosis not present

## 2021-02-24 DIAGNOSIS — I10 Essential (primary) hypertension: Secondary | ICD-10-CM | POA: Diagnosis not present

## 2021-02-24 DIAGNOSIS — E1140 Type 2 diabetes mellitus with diabetic neuropathy, unspecified: Secondary | ICD-10-CM | POA: Diagnosis not present

## 2021-02-25 DIAGNOSIS — I10 Essential (primary) hypertension: Secondary | ICD-10-CM | POA: Diagnosis not present

## 2021-02-25 DIAGNOSIS — E119 Type 2 diabetes mellitus without complications: Secondary | ICD-10-CM | POA: Diagnosis not present

## 2021-02-25 DIAGNOSIS — E1140 Type 2 diabetes mellitus with diabetic neuropathy, unspecified: Secondary | ICD-10-CM | POA: Diagnosis not present

## 2021-02-26 DIAGNOSIS — I10 Essential (primary) hypertension: Secondary | ICD-10-CM | POA: Diagnosis not present

## 2021-02-26 DIAGNOSIS — E119 Type 2 diabetes mellitus without complications: Secondary | ICD-10-CM | POA: Diagnosis not present

## 2021-02-26 DIAGNOSIS — E1140 Type 2 diabetes mellitus with diabetic neuropathy, unspecified: Secondary | ICD-10-CM | POA: Diagnosis not present

## 2021-02-27 DIAGNOSIS — I10 Essential (primary) hypertension: Secondary | ICD-10-CM | POA: Diagnosis not present

## 2021-02-27 DIAGNOSIS — E1140 Type 2 diabetes mellitus with diabetic neuropathy, unspecified: Secondary | ICD-10-CM | POA: Diagnosis not present

## 2021-02-27 DIAGNOSIS — E119 Type 2 diabetes mellitus without complications: Secondary | ICD-10-CM | POA: Diagnosis not present

## 2021-02-28 DIAGNOSIS — E1140 Type 2 diabetes mellitus with diabetic neuropathy, unspecified: Secondary | ICD-10-CM | POA: Diagnosis not present

## 2021-02-28 DIAGNOSIS — I10 Essential (primary) hypertension: Secondary | ICD-10-CM | POA: Diagnosis not present

## 2021-02-28 DIAGNOSIS — E119 Type 2 diabetes mellitus without complications: Secondary | ICD-10-CM | POA: Diagnosis not present

## 2021-03-01 DIAGNOSIS — E119 Type 2 diabetes mellitus without complications: Secondary | ICD-10-CM | POA: Diagnosis not present

## 2021-03-01 DIAGNOSIS — I10 Essential (primary) hypertension: Secondary | ICD-10-CM | POA: Diagnosis not present

## 2021-03-01 DIAGNOSIS — E1140 Type 2 diabetes mellitus with diabetic neuropathy, unspecified: Secondary | ICD-10-CM | POA: Diagnosis not present

## 2021-03-02 DIAGNOSIS — I10 Essential (primary) hypertension: Secondary | ICD-10-CM | POA: Diagnosis not present

## 2021-03-02 DIAGNOSIS — E1140 Type 2 diabetes mellitus with diabetic neuropathy, unspecified: Secondary | ICD-10-CM | POA: Diagnosis not present

## 2021-03-02 DIAGNOSIS — E119 Type 2 diabetes mellitus without complications: Secondary | ICD-10-CM | POA: Diagnosis not present

## 2021-03-03 DIAGNOSIS — I10 Essential (primary) hypertension: Secondary | ICD-10-CM | POA: Diagnosis not present

## 2021-03-03 DIAGNOSIS — E1140 Type 2 diabetes mellitus with diabetic neuropathy, unspecified: Secondary | ICD-10-CM | POA: Diagnosis not present

## 2021-03-03 DIAGNOSIS — E119 Type 2 diabetes mellitus without complications: Secondary | ICD-10-CM | POA: Diagnosis not present

## 2021-03-05 DIAGNOSIS — E119 Type 2 diabetes mellitus without complications: Secondary | ICD-10-CM | POA: Diagnosis not present

## 2021-03-05 DIAGNOSIS — E1140 Type 2 diabetes mellitus with diabetic neuropathy, unspecified: Secondary | ICD-10-CM | POA: Diagnosis not present

## 2021-03-05 DIAGNOSIS — I10 Essential (primary) hypertension: Secondary | ICD-10-CM | POA: Diagnosis not present

## 2021-03-06 DIAGNOSIS — E119 Type 2 diabetes mellitus without complications: Secondary | ICD-10-CM | POA: Diagnosis not present

## 2021-03-06 DIAGNOSIS — E1140 Type 2 diabetes mellitus with diabetic neuropathy, unspecified: Secondary | ICD-10-CM | POA: Diagnosis not present

## 2021-03-06 DIAGNOSIS — I10 Essential (primary) hypertension: Secondary | ICD-10-CM | POA: Diagnosis not present

## 2021-03-07 DIAGNOSIS — I10 Essential (primary) hypertension: Secondary | ICD-10-CM | POA: Diagnosis not present

## 2021-03-07 DIAGNOSIS — E1140 Type 2 diabetes mellitus with diabetic neuropathy, unspecified: Secondary | ICD-10-CM | POA: Diagnosis not present

## 2021-03-07 DIAGNOSIS — E119 Type 2 diabetes mellitus without complications: Secondary | ICD-10-CM | POA: Diagnosis not present

## 2021-03-08 DIAGNOSIS — I10 Essential (primary) hypertension: Secondary | ICD-10-CM | POA: Diagnosis not present

## 2021-03-08 DIAGNOSIS — E1140 Type 2 diabetes mellitus with diabetic neuropathy, unspecified: Secondary | ICD-10-CM | POA: Diagnosis not present

## 2021-03-08 DIAGNOSIS — E119 Type 2 diabetes mellitus without complications: Secondary | ICD-10-CM | POA: Diagnosis not present

## 2021-03-09 DIAGNOSIS — E119 Type 2 diabetes mellitus without complications: Secondary | ICD-10-CM | POA: Diagnosis not present

## 2021-03-09 DIAGNOSIS — I10 Essential (primary) hypertension: Secondary | ICD-10-CM | POA: Diagnosis not present

## 2021-03-09 DIAGNOSIS — E1140 Type 2 diabetes mellitus with diabetic neuropathy, unspecified: Secondary | ICD-10-CM | POA: Diagnosis not present

## 2021-03-10 DIAGNOSIS — I10 Essential (primary) hypertension: Secondary | ICD-10-CM | POA: Diagnosis not present

## 2021-03-10 DIAGNOSIS — E119 Type 2 diabetes mellitus without complications: Secondary | ICD-10-CM | POA: Diagnosis not present

## 2021-03-10 DIAGNOSIS — E1140 Type 2 diabetes mellitus with diabetic neuropathy, unspecified: Secondary | ICD-10-CM | POA: Diagnosis not present

## 2021-03-11 DIAGNOSIS — E1140 Type 2 diabetes mellitus with diabetic neuropathy, unspecified: Secondary | ICD-10-CM | POA: Diagnosis not present

## 2021-03-11 DIAGNOSIS — E119 Type 2 diabetes mellitus without complications: Secondary | ICD-10-CM | POA: Diagnosis not present

## 2021-03-11 DIAGNOSIS — I10 Essential (primary) hypertension: Secondary | ICD-10-CM | POA: Diagnosis not present

## 2021-03-13 DIAGNOSIS — E119 Type 2 diabetes mellitus without complications: Secondary | ICD-10-CM | POA: Diagnosis not present

## 2021-03-13 DIAGNOSIS — I10 Essential (primary) hypertension: Secondary | ICD-10-CM | POA: Diagnosis not present

## 2021-03-13 DIAGNOSIS — E1140 Type 2 diabetes mellitus with diabetic neuropathy, unspecified: Secondary | ICD-10-CM | POA: Diagnosis not present

## 2021-03-14 DIAGNOSIS — E1140 Type 2 diabetes mellitus with diabetic neuropathy, unspecified: Secondary | ICD-10-CM | POA: Diagnosis not present

## 2021-03-14 DIAGNOSIS — I10 Essential (primary) hypertension: Secondary | ICD-10-CM | POA: Diagnosis not present

## 2021-03-14 DIAGNOSIS — E119 Type 2 diabetes mellitus without complications: Secondary | ICD-10-CM | POA: Diagnosis not present

## 2021-03-15 DIAGNOSIS — E1140 Type 2 diabetes mellitus with diabetic neuropathy, unspecified: Secondary | ICD-10-CM | POA: Diagnosis not present

## 2021-03-15 DIAGNOSIS — I10 Essential (primary) hypertension: Secondary | ICD-10-CM | POA: Diagnosis not present

## 2021-03-15 DIAGNOSIS — E119 Type 2 diabetes mellitus without complications: Secondary | ICD-10-CM | POA: Diagnosis not present

## 2021-03-16 DIAGNOSIS — I10 Essential (primary) hypertension: Secondary | ICD-10-CM | POA: Diagnosis not present

## 2021-03-16 DIAGNOSIS — E119 Type 2 diabetes mellitus without complications: Secondary | ICD-10-CM | POA: Diagnosis not present

## 2021-03-16 DIAGNOSIS — E1140 Type 2 diabetes mellitus with diabetic neuropathy, unspecified: Secondary | ICD-10-CM | POA: Diagnosis not present

## 2021-03-17 DIAGNOSIS — I10 Essential (primary) hypertension: Secondary | ICD-10-CM | POA: Diagnosis not present

## 2021-03-17 DIAGNOSIS — E1140 Type 2 diabetes mellitus with diabetic neuropathy, unspecified: Secondary | ICD-10-CM | POA: Diagnosis not present

## 2021-03-17 DIAGNOSIS — E119 Type 2 diabetes mellitus without complications: Secondary | ICD-10-CM | POA: Diagnosis not present

## 2021-03-18 DIAGNOSIS — I10 Essential (primary) hypertension: Secondary | ICD-10-CM | POA: Diagnosis not present

## 2021-03-18 DIAGNOSIS — E119 Type 2 diabetes mellitus without complications: Secondary | ICD-10-CM | POA: Diagnosis not present

## 2021-03-18 DIAGNOSIS — E1140 Type 2 diabetes mellitus with diabetic neuropathy, unspecified: Secondary | ICD-10-CM | POA: Diagnosis not present

## 2021-03-19 DIAGNOSIS — E1140 Type 2 diabetes mellitus with diabetic neuropathy, unspecified: Secondary | ICD-10-CM | POA: Diagnosis not present

## 2021-03-19 DIAGNOSIS — E119 Type 2 diabetes mellitus without complications: Secondary | ICD-10-CM | POA: Diagnosis not present

## 2021-03-19 DIAGNOSIS — I10 Essential (primary) hypertension: Secondary | ICD-10-CM | POA: Diagnosis not present

## 2021-03-20 DIAGNOSIS — E1140 Type 2 diabetes mellitus with diabetic neuropathy, unspecified: Secondary | ICD-10-CM | POA: Diagnosis not present

## 2021-03-20 DIAGNOSIS — E119 Type 2 diabetes mellitus without complications: Secondary | ICD-10-CM | POA: Diagnosis not present

## 2021-03-20 DIAGNOSIS — I10 Essential (primary) hypertension: Secondary | ICD-10-CM | POA: Diagnosis not present

## 2021-03-21 DIAGNOSIS — E119 Type 2 diabetes mellitus without complications: Secondary | ICD-10-CM | POA: Diagnosis not present

## 2021-03-21 DIAGNOSIS — I10 Essential (primary) hypertension: Secondary | ICD-10-CM | POA: Diagnosis not present

## 2021-03-21 DIAGNOSIS — E1140 Type 2 diabetes mellitus with diabetic neuropathy, unspecified: Secondary | ICD-10-CM | POA: Diagnosis not present

## 2021-03-22 DIAGNOSIS — I10 Essential (primary) hypertension: Secondary | ICD-10-CM | POA: Diagnosis not present

## 2021-03-22 DIAGNOSIS — E1140 Type 2 diabetes mellitus with diabetic neuropathy, unspecified: Secondary | ICD-10-CM | POA: Diagnosis not present

## 2021-03-22 DIAGNOSIS — E119 Type 2 diabetes mellitus without complications: Secondary | ICD-10-CM | POA: Diagnosis not present

## 2021-03-23 DIAGNOSIS — E1140 Type 2 diabetes mellitus with diabetic neuropathy, unspecified: Secondary | ICD-10-CM | POA: Diagnosis not present

## 2021-03-23 DIAGNOSIS — E119 Type 2 diabetes mellitus without complications: Secondary | ICD-10-CM | POA: Diagnosis not present

## 2021-03-23 DIAGNOSIS — I10 Essential (primary) hypertension: Secondary | ICD-10-CM | POA: Diagnosis not present

## 2021-03-24 DIAGNOSIS — E1140 Type 2 diabetes mellitus with diabetic neuropathy, unspecified: Secondary | ICD-10-CM | POA: Diagnosis not present

## 2021-03-24 DIAGNOSIS — I10 Essential (primary) hypertension: Secondary | ICD-10-CM | POA: Diagnosis not present

## 2021-03-24 DIAGNOSIS — E119 Type 2 diabetes mellitus without complications: Secondary | ICD-10-CM | POA: Diagnosis not present

## 2021-03-25 DIAGNOSIS — E119 Type 2 diabetes mellitus without complications: Secondary | ICD-10-CM | POA: Diagnosis not present

## 2021-03-25 DIAGNOSIS — I10 Essential (primary) hypertension: Secondary | ICD-10-CM | POA: Diagnosis not present

## 2021-03-25 DIAGNOSIS — E1140 Type 2 diabetes mellitus with diabetic neuropathy, unspecified: Secondary | ICD-10-CM | POA: Diagnosis not present

## 2021-03-26 DIAGNOSIS — E1140 Type 2 diabetes mellitus with diabetic neuropathy, unspecified: Secondary | ICD-10-CM | POA: Diagnosis not present

## 2021-03-26 DIAGNOSIS — I10 Essential (primary) hypertension: Secondary | ICD-10-CM | POA: Diagnosis not present

## 2021-03-26 DIAGNOSIS — E119 Type 2 diabetes mellitus without complications: Secondary | ICD-10-CM | POA: Diagnosis not present

## 2021-03-27 DIAGNOSIS — I10 Essential (primary) hypertension: Secondary | ICD-10-CM | POA: Diagnosis not present

## 2021-03-27 DIAGNOSIS — E119 Type 2 diabetes mellitus without complications: Secondary | ICD-10-CM | POA: Diagnosis not present

## 2021-03-27 DIAGNOSIS — E1140 Type 2 diabetes mellitus with diabetic neuropathy, unspecified: Secondary | ICD-10-CM | POA: Diagnosis not present

## 2021-03-28 DIAGNOSIS — E119 Type 2 diabetes mellitus without complications: Secondary | ICD-10-CM | POA: Diagnosis not present

## 2021-03-28 DIAGNOSIS — I10 Essential (primary) hypertension: Secondary | ICD-10-CM | POA: Diagnosis not present

## 2021-03-28 DIAGNOSIS — E1140 Type 2 diabetes mellitus with diabetic neuropathy, unspecified: Secondary | ICD-10-CM | POA: Diagnosis not present

## 2021-03-29 DIAGNOSIS — I10 Essential (primary) hypertension: Secondary | ICD-10-CM | POA: Diagnosis not present

## 2021-03-29 DIAGNOSIS — E1140 Type 2 diabetes mellitus with diabetic neuropathy, unspecified: Secondary | ICD-10-CM | POA: Diagnosis not present

## 2021-03-29 DIAGNOSIS — E119 Type 2 diabetes mellitus without complications: Secondary | ICD-10-CM | POA: Diagnosis not present

## 2021-03-30 DIAGNOSIS — I10 Essential (primary) hypertension: Secondary | ICD-10-CM | POA: Diagnosis not present

## 2021-03-30 DIAGNOSIS — E119 Type 2 diabetes mellitus without complications: Secondary | ICD-10-CM | POA: Diagnosis not present

## 2021-03-30 DIAGNOSIS — E1140 Type 2 diabetes mellitus with diabetic neuropathy, unspecified: Secondary | ICD-10-CM | POA: Diagnosis not present

## 2021-03-31 DIAGNOSIS — I10 Essential (primary) hypertension: Secondary | ICD-10-CM | POA: Diagnosis not present

## 2021-03-31 DIAGNOSIS — E1140 Type 2 diabetes mellitus with diabetic neuropathy, unspecified: Secondary | ICD-10-CM | POA: Diagnosis not present

## 2021-03-31 DIAGNOSIS — E119 Type 2 diabetes mellitus without complications: Secondary | ICD-10-CM | POA: Diagnosis not present

## 2021-04-01 DIAGNOSIS — I10 Essential (primary) hypertension: Secondary | ICD-10-CM | POA: Diagnosis not present

## 2021-04-01 DIAGNOSIS — E119 Type 2 diabetes mellitus without complications: Secondary | ICD-10-CM | POA: Diagnosis not present

## 2021-04-01 DIAGNOSIS — E1140 Type 2 diabetes mellitus with diabetic neuropathy, unspecified: Secondary | ICD-10-CM | POA: Diagnosis not present

## 2021-04-02 DIAGNOSIS — I10 Essential (primary) hypertension: Secondary | ICD-10-CM | POA: Diagnosis not present

## 2021-04-02 DIAGNOSIS — E119 Type 2 diabetes mellitus without complications: Secondary | ICD-10-CM | POA: Diagnosis not present

## 2021-04-02 DIAGNOSIS — E1140 Type 2 diabetes mellitus with diabetic neuropathy, unspecified: Secondary | ICD-10-CM | POA: Diagnosis not present

## 2021-04-03 DIAGNOSIS — E1140 Type 2 diabetes mellitus with diabetic neuropathy, unspecified: Secondary | ICD-10-CM | POA: Diagnosis not present

## 2021-04-03 DIAGNOSIS — I10 Essential (primary) hypertension: Secondary | ICD-10-CM | POA: Diagnosis not present

## 2021-04-03 DIAGNOSIS — E119 Type 2 diabetes mellitus without complications: Secondary | ICD-10-CM | POA: Diagnosis not present

## 2021-04-04 DIAGNOSIS — I10 Essential (primary) hypertension: Secondary | ICD-10-CM | POA: Diagnosis not present

## 2021-04-04 DIAGNOSIS — E1140 Type 2 diabetes mellitus with diabetic neuropathy, unspecified: Secondary | ICD-10-CM | POA: Diagnosis not present

## 2021-04-04 DIAGNOSIS — E119 Type 2 diabetes mellitus without complications: Secondary | ICD-10-CM | POA: Diagnosis not present

## 2021-04-05 DIAGNOSIS — E1140 Type 2 diabetes mellitus with diabetic neuropathy, unspecified: Secondary | ICD-10-CM | POA: Diagnosis not present

## 2021-04-05 DIAGNOSIS — E119 Type 2 diabetes mellitus without complications: Secondary | ICD-10-CM | POA: Diagnosis not present

## 2021-04-05 DIAGNOSIS — I10 Essential (primary) hypertension: Secondary | ICD-10-CM | POA: Diagnosis not present

## 2021-04-06 DIAGNOSIS — E1140 Type 2 diabetes mellitus with diabetic neuropathy, unspecified: Secondary | ICD-10-CM | POA: Diagnosis not present

## 2021-04-06 DIAGNOSIS — E119 Type 2 diabetes mellitus without complications: Secondary | ICD-10-CM | POA: Diagnosis not present

## 2021-04-06 DIAGNOSIS — I10 Essential (primary) hypertension: Secondary | ICD-10-CM | POA: Diagnosis not present

## 2021-04-07 DIAGNOSIS — I10 Essential (primary) hypertension: Secondary | ICD-10-CM | POA: Diagnosis not present

## 2021-04-07 DIAGNOSIS — E119 Type 2 diabetes mellitus without complications: Secondary | ICD-10-CM | POA: Diagnosis not present

## 2021-04-07 DIAGNOSIS — E1140 Type 2 diabetes mellitus with diabetic neuropathy, unspecified: Secondary | ICD-10-CM | POA: Diagnosis not present

## 2021-04-08 DIAGNOSIS — E1140 Type 2 diabetes mellitus with diabetic neuropathy, unspecified: Secondary | ICD-10-CM | POA: Diagnosis not present

## 2021-04-08 DIAGNOSIS — E119 Type 2 diabetes mellitus without complications: Secondary | ICD-10-CM | POA: Diagnosis not present

## 2021-04-08 DIAGNOSIS — I10 Essential (primary) hypertension: Secondary | ICD-10-CM | POA: Diagnosis not present

## 2021-04-09 DIAGNOSIS — E1140 Type 2 diabetes mellitus with diabetic neuropathy, unspecified: Secondary | ICD-10-CM | POA: Diagnosis not present

## 2021-04-09 DIAGNOSIS — I10 Essential (primary) hypertension: Secondary | ICD-10-CM | POA: Diagnosis not present

## 2021-04-09 DIAGNOSIS — E119 Type 2 diabetes mellitus without complications: Secondary | ICD-10-CM | POA: Diagnosis not present

## 2021-04-10 DIAGNOSIS — E1140 Type 2 diabetes mellitus with diabetic neuropathy, unspecified: Secondary | ICD-10-CM | POA: Diagnosis not present

## 2021-04-10 DIAGNOSIS — E119 Type 2 diabetes mellitus without complications: Secondary | ICD-10-CM | POA: Diagnosis not present

## 2021-04-10 DIAGNOSIS — I10 Essential (primary) hypertension: Secondary | ICD-10-CM | POA: Diagnosis not present

## 2021-04-11 DIAGNOSIS — E1140 Type 2 diabetes mellitus with diabetic neuropathy, unspecified: Secondary | ICD-10-CM | POA: Diagnosis not present

## 2021-04-11 DIAGNOSIS — E119 Type 2 diabetes mellitus without complications: Secondary | ICD-10-CM | POA: Diagnosis not present

## 2021-04-11 DIAGNOSIS — I10 Essential (primary) hypertension: Secondary | ICD-10-CM | POA: Diagnosis not present

## 2021-04-12 DIAGNOSIS — E119 Type 2 diabetes mellitus without complications: Secondary | ICD-10-CM | POA: Diagnosis not present

## 2021-04-12 DIAGNOSIS — I10 Essential (primary) hypertension: Secondary | ICD-10-CM | POA: Diagnosis not present

## 2021-04-12 DIAGNOSIS — E1140 Type 2 diabetes mellitus with diabetic neuropathy, unspecified: Secondary | ICD-10-CM | POA: Diagnosis not present

## 2021-04-13 DIAGNOSIS — E1140 Type 2 diabetes mellitus with diabetic neuropathy, unspecified: Secondary | ICD-10-CM | POA: Diagnosis not present

## 2021-04-13 DIAGNOSIS — I10 Essential (primary) hypertension: Secondary | ICD-10-CM | POA: Diagnosis not present

## 2021-04-13 DIAGNOSIS — E119 Type 2 diabetes mellitus without complications: Secondary | ICD-10-CM | POA: Diagnosis not present

## 2021-04-14 DIAGNOSIS — I10 Essential (primary) hypertension: Secondary | ICD-10-CM | POA: Diagnosis not present

## 2021-04-14 DIAGNOSIS — E119 Type 2 diabetes mellitus without complications: Secondary | ICD-10-CM | POA: Diagnosis not present

## 2021-04-14 DIAGNOSIS — E1140 Type 2 diabetes mellitus with diabetic neuropathy, unspecified: Secondary | ICD-10-CM | POA: Diagnosis not present

## 2021-04-15 DIAGNOSIS — E119 Type 2 diabetes mellitus without complications: Secondary | ICD-10-CM | POA: Diagnosis not present

## 2021-04-15 DIAGNOSIS — E1140 Type 2 diabetes mellitus with diabetic neuropathy, unspecified: Secondary | ICD-10-CM | POA: Diagnosis not present

## 2021-04-15 DIAGNOSIS — I10 Essential (primary) hypertension: Secondary | ICD-10-CM | POA: Diagnosis not present

## 2021-04-17 DIAGNOSIS — E1140 Type 2 diabetes mellitus with diabetic neuropathy, unspecified: Secondary | ICD-10-CM | POA: Diagnosis not present

## 2021-04-17 DIAGNOSIS — E119 Type 2 diabetes mellitus without complications: Secondary | ICD-10-CM | POA: Diagnosis not present

## 2021-04-17 DIAGNOSIS — I10 Essential (primary) hypertension: Secondary | ICD-10-CM | POA: Diagnosis not present

## 2021-04-18 DIAGNOSIS — E119 Type 2 diabetes mellitus without complications: Secondary | ICD-10-CM | POA: Diagnosis not present

## 2021-04-18 DIAGNOSIS — E1140 Type 2 diabetes mellitus with diabetic neuropathy, unspecified: Secondary | ICD-10-CM | POA: Diagnosis not present

## 2021-04-18 DIAGNOSIS — I10 Essential (primary) hypertension: Secondary | ICD-10-CM | POA: Diagnosis not present

## 2021-04-19 DIAGNOSIS — E1140 Type 2 diabetes mellitus with diabetic neuropathy, unspecified: Secondary | ICD-10-CM | POA: Diagnosis not present

## 2021-04-19 DIAGNOSIS — I10 Essential (primary) hypertension: Secondary | ICD-10-CM | POA: Diagnosis not present

## 2021-04-19 DIAGNOSIS — E119 Type 2 diabetes mellitus without complications: Secondary | ICD-10-CM | POA: Diagnosis not present

## 2021-04-20 DIAGNOSIS — I10 Essential (primary) hypertension: Secondary | ICD-10-CM | POA: Diagnosis not present

## 2021-04-20 DIAGNOSIS — E1140 Type 2 diabetes mellitus with diabetic neuropathy, unspecified: Secondary | ICD-10-CM | POA: Diagnosis not present

## 2021-04-20 DIAGNOSIS — E119 Type 2 diabetes mellitus without complications: Secondary | ICD-10-CM | POA: Diagnosis not present

## 2021-04-21 DIAGNOSIS — I10 Essential (primary) hypertension: Secondary | ICD-10-CM | POA: Diagnosis not present

## 2021-04-21 DIAGNOSIS — E119 Type 2 diabetes mellitus without complications: Secondary | ICD-10-CM | POA: Diagnosis not present

## 2021-04-21 DIAGNOSIS — E1140 Type 2 diabetes mellitus with diabetic neuropathy, unspecified: Secondary | ICD-10-CM | POA: Diagnosis not present

## 2021-04-22 DIAGNOSIS — E1140 Type 2 diabetes mellitus with diabetic neuropathy, unspecified: Secondary | ICD-10-CM | POA: Diagnosis not present

## 2021-04-22 DIAGNOSIS — I10 Essential (primary) hypertension: Secondary | ICD-10-CM | POA: Diagnosis not present

## 2021-04-22 DIAGNOSIS — E119 Type 2 diabetes mellitus without complications: Secondary | ICD-10-CM | POA: Diagnosis not present

## 2021-04-23 DIAGNOSIS — E1140 Type 2 diabetes mellitus with diabetic neuropathy, unspecified: Secondary | ICD-10-CM | POA: Diagnosis not present

## 2021-04-23 DIAGNOSIS — E119 Type 2 diabetes mellitus without complications: Secondary | ICD-10-CM | POA: Diagnosis not present

## 2021-04-23 DIAGNOSIS — I10 Essential (primary) hypertension: Secondary | ICD-10-CM | POA: Diagnosis not present

## 2021-04-24 DIAGNOSIS — E119 Type 2 diabetes mellitus without complications: Secondary | ICD-10-CM | POA: Diagnosis not present

## 2021-04-24 DIAGNOSIS — I10 Essential (primary) hypertension: Secondary | ICD-10-CM | POA: Diagnosis not present

## 2021-04-24 DIAGNOSIS — E1140 Type 2 diabetes mellitus with diabetic neuropathy, unspecified: Secondary | ICD-10-CM | POA: Diagnosis not present

## 2021-04-25 DIAGNOSIS — E1140 Type 2 diabetes mellitus with diabetic neuropathy, unspecified: Secondary | ICD-10-CM | POA: Diagnosis not present

## 2021-04-25 DIAGNOSIS — E119 Type 2 diabetes mellitus without complications: Secondary | ICD-10-CM | POA: Diagnosis not present

## 2021-04-25 DIAGNOSIS — I10 Essential (primary) hypertension: Secondary | ICD-10-CM | POA: Diagnosis not present

## 2021-05-20 ENCOUNTER — Other Ambulatory Visit: Payer: Self-pay | Admitting: *Deleted

## 2021-05-20 NOTE — Patient Outreach (Signed)
[place] [facility]) Care Management  05/20/2021  VELERA LANSDALE [birth year] [id number]   Referral Date: 6/17 Referral Source: Insurance Referral Reason: Chronic care management Insurance: [place] attempt #1, successful however member state she is out of town and unable to talk.  Request call back in a couple days.  Plan: RN CM will send outreach letter and follow up on Thursday as requested.  Valente David, [place], MSN [place] [phone]

## 2021-05-22 ENCOUNTER — Other Ambulatory Visit: Payer: Self-pay | Admitting: *Deleted

## 2021-05-22 ENCOUNTER — Encounter: Payer: Self-pay | Admitting: *Deleted

## 2021-05-22 NOTE — Patient Outreach (Signed)
[place] [facility]) Care Management  [place]  05/23/2021   Cynthia Rosales [birth year] [id number]   Referral Date: 6/17 Referral Source: Insurance Referral Reason: Chronic care management Insurance: [place] attempt #2, successful.  Identity verified.  This care manager introduced self and stated purpose of call.  [facility] care management services explained.    Social: Report living with children and significant other.  Has home aides that are in the home 7 days a week, 3 hours M-F and 1.5 hours on the weekends.  Has lower extremity paralysis, uses walker sometimes, mostly in wheelchair.  Conditions: Per chart, history of HTN, DM, Spinal cord injury, neurogenic bladder, and hysterectomy.  Medications: Reviewed with member, state she is taking as instructed.  Appointments: Last seen by PCP in January, state next visit in July.  Would like to restart outpatient PT sessions, will discuss with provider.  Encounter Medications:  Outpatient Encounter Medications as of 05/22/2021  Medication Sig   diazepam (VALIUM) 5 MG tablet Take 5 mg by mouth 2 (two) times daily as needed for muscle spasms.   hydrochlorothiazide (HYDRODIURIL) 25 MG tablet Take 25 mg by mouth daily.    NOVOLOG MIX 70/30 FLEXPEN (70-30) 100 UNIT/ML FlexPen Inject 36 Units into the skin in the morning, at noon, and at bedtime.    oxycodone (ROXICODONE) 30 MG immediate release tablet Take 1 tablet (30 mg total) by mouth every 6 (six) hours as needed for pain. (Patient taking differently: Take 45 mg by mouth every 4 (four) hours as needed for pain.)   sitaGLIPtin (JANUVIA) 100 MG tablet Take 100 mg by mouth daily.   No facility-administered encounter medications on file as of 05/22/2021.    Functional Status:  In your present state of health, do you have any difficulty performing the following activities: 12/05/2020 12/03/2020  Hearing? N N  Vision? N N  Difficulty concentrating or making decisions?  N N  Walking or climbing stairs? N Y  Dressing or bathing? N N  Doing errands, shopping? N -  Some recent data might be hidden    Fall/Depression Screening: Fall Risk  05/22/2021 01/13/2016 01/03/2016  Falls in the past year? 1 Yes Yes  Number falls in past yr: 0 2 or more 2 or more  Injury with Fall? 0 - Yes  Risk Factor Category  - High Fall Risk High Fall Risk  Risk for fall due to : History of fall(s) - -  Follow up Falls evaluation completed;Education provided - -   PHQ 2/9 Scores 05/22/2021 08/09/2017 01/13/2016 01/03/2016 08/23/2015  PHQ - 2 Score 0 0 1 0 6  PHQ- 9 Score - 0 - - 16    Assessment:   Care Plan Care Plan : Hypertension (Adult)  Updates made by Valente David, RN since 05/23/2021 12:00 AM     Problem: Hypertension (Hypertension)      Goal: Hypertension Monitored   Start Date: 05/22/2021  Expected End Date: 06/22/2021  Priority: High  Note:   Evidence-based guidance:  Promote initial use of ambulatory blood pressure measurements (for 3 days) to rule out "white-coat" effect; identify masked hypertension and presence or absence of nocturnal "dipping" of blood pressure.   Encourage continued use of home blood pressure monitoring and recording in blood pressure log; include symptoms of hypotension or potential medication side effects in log.  Review blood pressure measurements taken inside and outside of the provider office; establish baseline and monitor trends; compare to target ranges or patient  goal.  Share overall cardiovascular risk with patient; encourage changes to lifestyle risk factors, including alcohol consumption, smoking, inadequate exercise, poor dietary habits and stress.   Notes:     Task: Identify and Monitor Blood Pressure Elevation   Due Date: 06/22/2021  Note:   Care Management Activities:    - depression screen reviewed - home or ambulatory blood pressure monitoring encouraged    Notes:     Problem: Disease Progression (Hypertension)       Goal: Disease Progression Prevented or Minimized   Start Date: 05/22/2021  Expected End Date: 08/21/2021  Priority: Medium  Note:   Evidence-based guidance:  Tailor lifestyle advice to individual; review progress regularly; give frequent encouragement and respond positively to incremental successes.  Assess for and promote awareness of worsening disease or development of comorbidity.  Prepare patient for laboratory and diagnostic exams based on risk and presentation.  Prepare patient for use of pharmacologic therapy that may include diuretic, beta-blocker, beta-blocker/thiazide combination, angiotensin-converting enzyme inhibitor, renin-angiotensin blocker or calcium-channel blocker.  Expect periodic adjustments to pharmacologic therapy; manage side effects.  Promote a healthy diet that includes primarily plant-based foods, such as fruits, vegetables, whole grains, beans and legumes, low-fat dairy and lean meats.   Consider moderate reduction in sodium intake by avoiding the addition of salt to prepared foods and limiting processed meats, canned soup, frozen meals and salty snacks.   Promote a regular, daily exercise goal of 150 minutes per week of moderate exercise based on tolerance, ability and patient choice; consider referral to physical therapist, community wellness and/or activity program.  Encourage the avoidance of no more than 2 hours per day of sedentary activity, such as recreational screen time.  Review sources of stress; explore current coping strategies and encourage use of mindfulness, yoga, meditation or exercise to manage stress.   Notes:     Task: Alleviate Barriers to Hypertension Treatment   Due Date: 08/21/2021  Note:   Care Management Activities:    - healthy diet promoted - healthy family lifestyle promoted - reduction in sedentary activities encouraged    Notes:       Goals Addressed             This Visit's Progress    THN - Lifestyle  Change-Hypertension       Timeframe:  Long-Range Goal Priority:  Medium Start Date:            6/23                 Expected End Date:    9/23                   Follow Up Date 06/11/2021   Barriers: Knowledge    - ask questions to understand - have a family meeting to talk about healthy habits - learn about high blood pressure    Why is this important?   The changes that you are asked to make may be hard to do.  This is especially true when the changes are life-long.  Knowing why it is important to you is the first step.  Working on the change with your family or support person helps you not feel alone.  Reward yourself and family or support person when goals are met. This can be an activity you choose like bowling, hiking, biking, swimming or shooting hoops.     Notes:   6/23 - Discussed increasing activity in effort to help better manage HTN.  Will call insurance  to inquire about gym membership.  This RNCM called PCP office to request new order for outpatient PT in effort to improve strength      [facility] - Track and Manage My Blood Pressure-Hypertension       Timeframe:  Short-Term Goal Priority:  High Start Date:       6/23                      Expected End Date:           7/23  Barriers: Health Behaviors             Follow Up Date 06/11/2021    - check blood pressure 3 times per week - write blood pressure results in a log or diary    Why is this important?   You won't feel high blood pressure, but it can still hurt your blood vessels.  High blood pressure can cause heart or kidney problems. It can also cause a stroke.  Making lifestyle changes like losing a little weight or eating less salt will help.  Checking your blood pressure at home and at different times of the day can help to control blood pressure.  If the doctor prescribes medicine remember to take it the way the doctor ordered.  Call the office if you cannot afford the medicine or if there are questions  about it.     Notes:   6/23 - Encouraged to start BP log and share trends with PCP.  Will send [facility] calendar with logs          Plan:  Follow-up: Patient agrees to Care Plan and Follow-up. Follow-up in 3 week(s)   Valente David, RN, MSN [place] [phone]

## 2021-05-24 DIAGNOSIS — G894 Chronic pain syndrome: Secondary | ICD-10-CM | POA: Diagnosis not present

## 2021-05-25 DIAGNOSIS — G894 Chronic pain syndrome: Secondary | ICD-10-CM | POA: Diagnosis not present

## 2021-05-26 DIAGNOSIS — G894 Chronic pain syndrome: Secondary | ICD-10-CM | POA: Diagnosis not present

## 2021-05-27 DIAGNOSIS — G894 Chronic pain syndrome: Secondary | ICD-10-CM | POA: Diagnosis not present

## 2021-05-28 DIAGNOSIS — G894 Chronic pain syndrome: Secondary | ICD-10-CM | POA: Diagnosis not present

## 2021-05-29 DIAGNOSIS — G894 Chronic pain syndrome: Secondary | ICD-10-CM | POA: Diagnosis not present

## 2021-05-30 DIAGNOSIS — G894 Chronic pain syndrome: Secondary | ICD-10-CM | POA: Diagnosis not present

## 2021-05-31 DIAGNOSIS — G894 Chronic pain syndrome: Secondary | ICD-10-CM | POA: Diagnosis not present

## 2021-06-01 DIAGNOSIS — G894 Chronic pain syndrome: Secondary | ICD-10-CM | POA: Diagnosis not present

## 2021-06-02 DIAGNOSIS — G894 Chronic pain syndrome: Secondary | ICD-10-CM | POA: Diagnosis not present

## 2021-06-03 DIAGNOSIS — G894 Chronic pain syndrome: Secondary | ICD-10-CM | POA: Diagnosis not present

## 2021-06-04 DIAGNOSIS — G894 Chronic pain syndrome: Secondary | ICD-10-CM | POA: Diagnosis not present

## 2021-06-05 DIAGNOSIS — G894 Chronic pain syndrome: Secondary | ICD-10-CM | POA: Diagnosis not present

## 2021-06-06 DIAGNOSIS — G894 Chronic pain syndrome: Secondary | ICD-10-CM | POA: Diagnosis not present

## 2021-06-07 DIAGNOSIS — G894 Chronic pain syndrome: Secondary | ICD-10-CM | POA: Diagnosis not present

## 2021-06-08 DIAGNOSIS — G894 Chronic pain syndrome: Secondary | ICD-10-CM | POA: Diagnosis not present

## 2021-06-09 DIAGNOSIS — G894 Chronic pain syndrome: Secondary | ICD-10-CM | POA: Diagnosis not present

## 2021-06-10 DIAGNOSIS — G894 Chronic pain syndrome: Secondary | ICD-10-CM | POA: Diagnosis not present

## 2021-06-11 ENCOUNTER — Other Ambulatory Visit: Payer: Self-pay | Admitting: *Deleted

## 2021-06-11 DIAGNOSIS — G894 Chronic pain syndrome: Secondary | ICD-10-CM | POA: Diagnosis not present

## 2021-06-11 NOTE — Patient Outreach (Signed)
[place] [facility]) Care Management  06/11/2021  Cynthia Rosales [birth year] [id number]   Outgoing call placed to member, state she has been doing well.  Admit she has not had the chance to review education information due to several things going on personally but will do so.  Denies any urgent concerns, encouraged to contact this care manager with questions.  Agrees to follow up within the next 2 months.    Goals Addressed             This Visit's Progress    THN - Lifestyle Change-Hypertension   On track    Timeframe:  Long-Range Goal Priority:  Medium Start Date:            6/23                 Expected End Date:    9/23                   Follow Up Date 06/11/2021   Barriers: Knowledge    - ask questions to understand - have a family meeting to talk about healthy habits - learn about high blood pressure    Why is this important?   The changes that you are asked to make may be hard to do.  This is especially true when the changes are life-long.  Knowing why it is important to you is the first step.  Working on the change with your family or support person helps you not feel alone.  Reward yourself and family or support person when goals are met. This can be an activity you choose like bowling, hiking, biking, swimming or shooting hoops.     Notes:   6/23 - Discussed increasing activity in effort to help better manage HTN.  Will call insurance to inquire about gym membership.  This RNCM called PCP office to request new order for outpatient PT in effort to improve strength  7/13 - Report she is still working on her lifestyle changes, advised again to call insurance company regarding gym/wellness benefits.  Will call PCP office to schedule follow up appointment in effort to restart PT sessions.      THN - Track and Manage My Blood Pressure-Hypertension   On track    Timeframe:  Short-Term Goal Priority:  High Start Date:       6/23                       Expected End Date:           7/23  Barriers: Health Behaviors             Follow Up Date 06/11/2021    - check blood pressure 3 times per week - write blood pressure results in a log or diary    Why is this important?   You won't feel high blood pressure, but it can still hurt your blood vessels.  High blood pressure can cause heart or kidney problems. It can also cause a stroke.  Making lifestyle changes like losing a little weight or eating less salt will help.  Checking your blood pressure at home and at different times of the day can help to control blood pressure.  If the doctor prescribes medicine remember to take it the way the doctor ordered.  Call the office if you cannot afford the medicine or if there are questions about it.     Notes:   6/23 - Encouraged  to start BP log and share trends with PCP.  Will send [facility] calendar with logs  7/13 - Confirms she received HTN education and logs in the mail, will review and start daily monitoring         Valente David, Therapist, sports, MSN [place] Manager [phone]

## 2021-06-12 DIAGNOSIS — G894 Chronic pain syndrome: Secondary | ICD-10-CM | POA: Diagnosis not present

## 2021-06-13 DIAGNOSIS — G894 Chronic pain syndrome: Secondary | ICD-10-CM | POA: Diagnosis not present

## 2021-06-14 DIAGNOSIS — G894 Chronic pain syndrome: Secondary | ICD-10-CM | POA: Diagnosis not present

## 2021-06-15 DIAGNOSIS — G894 Chronic pain syndrome: Secondary | ICD-10-CM | POA: Diagnosis not present

## 2021-06-16 DIAGNOSIS — G894 Chronic pain syndrome: Secondary | ICD-10-CM | POA: Diagnosis not present

## 2021-06-17 DIAGNOSIS — G894 Chronic pain syndrome: Secondary | ICD-10-CM | POA: Diagnosis not present

## 2021-06-18 DIAGNOSIS — G894 Chronic pain syndrome: Secondary | ICD-10-CM | POA: Diagnosis not present

## 2021-06-19 DIAGNOSIS — G894 Chronic pain syndrome: Secondary | ICD-10-CM | POA: Diagnosis not present

## 2021-06-20 DIAGNOSIS — G894 Chronic pain syndrome: Secondary | ICD-10-CM | POA: Diagnosis not present

## 2021-06-21 DIAGNOSIS — G894 Chronic pain syndrome: Secondary | ICD-10-CM | POA: Diagnosis not present

## 2021-06-22 DIAGNOSIS — G894 Chronic pain syndrome: Secondary | ICD-10-CM | POA: Diagnosis not present

## 2021-06-23 DIAGNOSIS — G894 Chronic pain syndrome: Secondary | ICD-10-CM | POA: Diagnosis not present

## 2021-06-24 DIAGNOSIS — G894 Chronic pain syndrome: Secondary | ICD-10-CM | POA: Diagnosis not present

## 2021-06-25 DIAGNOSIS — G894 Chronic pain syndrome: Secondary | ICD-10-CM | POA: Diagnosis not present

## 2021-06-26 DIAGNOSIS — G894 Chronic pain syndrome: Secondary | ICD-10-CM | POA: Diagnosis not present

## 2021-06-27 DIAGNOSIS — G894 Chronic pain syndrome: Secondary | ICD-10-CM | POA: Diagnosis not present

## 2021-06-28 DIAGNOSIS — G894 Chronic pain syndrome: Secondary | ICD-10-CM | POA: Diagnosis not present

## 2021-06-29 DIAGNOSIS — G894 Chronic pain syndrome: Secondary | ICD-10-CM | POA: Diagnosis not present

## 2021-06-30 DIAGNOSIS — G894 Chronic pain syndrome: Secondary | ICD-10-CM | POA: Diagnosis not present

## 2021-07-01 DIAGNOSIS — G894 Chronic pain syndrome: Secondary | ICD-10-CM | POA: Diagnosis not present

## 2021-07-02 DIAGNOSIS — G894 Chronic pain syndrome: Secondary | ICD-10-CM | POA: Diagnosis not present

## 2021-07-03 DIAGNOSIS — G894 Chronic pain syndrome: Secondary | ICD-10-CM | POA: Diagnosis not present

## 2021-07-04 DIAGNOSIS — G894 Chronic pain syndrome: Secondary | ICD-10-CM | POA: Diagnosis not present

## 2021-07-05 DIAGNOSIS — G894 Chronic pain syndrome: Secondary | ICD-10-CM | POA: Diagnosis not present

## 2021-07-06 DIAGNOSIS — G894 Chronic pain syndrome: Secondary | ICD-10-CM | POA: Diagnosis not present

## 2021-07-07 DIAGNOSIS — G894 Chronic pain syndrome: Secondary | ICD-10-CM | POA: Diagnosis not present

## 2021-07-08 DIAGNOSIS — G894 Chronic pain syndrome: Secondary | ICD-10-CM | POA: Diagnosis not present

## 2021-07-09 DIAGNOSIS — G894 Chronic pain syndrome: Secondary | ICD-10-CM | POA: Diagnosis not present

## 2021-07-10 ENCOUNTER — Other Ambulatory Visit: Payer: Self-pay

## 2021-07-10 ENCOUNTER — Encounter (HOSPITAL_COMMUNITY): Payer: Self-pay

## 2021-07-10 ENCOUNTER — Emergency Department (HOSPITAL_COMMUNITY)
Admission: EM | Admit: 2021-07-10 | Discharge: 2021-07-10 | Disposition: A | Discharge status: Home / Self Care | Payer: Medicare HMO | Attending: Emergency Medicine | Admitting: Emergency Medicine

## 2021-07-10 ENCOUNTER — Emergency Department (HOSPITAL_COMMUNITY): Payer: Medicare HMO

## 2021-07-10 DIAGNOSIS — I700 Atherosclerosis of aorta: Secondary | ICD-10-CM | POA: Diagnosis not present

## 2021-07-10 DIAGNOSIS — R509 Fever, unspecified: Secondary | ICD-10-CM | POA: Insufficient documentation

## 2021-07-10 DIAGNOSIS — R112 Nausea with vomiting, unspecified: Secondary | ICD-10-CM | POA: Diagnosis not present

## 2021-07-10 DIAGNOSIS — D5700 Hb-SS disease with crisis, unspecified: Secondary | ICD-10-CM | POA: Insufficient documentation

## 2021-07-10 DIAGNOSIS — R61 Generalized hyperhidrosis: Secondary | ICD-10-CM | POA: Insufficient documentation

## 2021-07-10 DIAGNOSIS — R0902 Hypoxemia: Secondary | ICD-10-CM | POA: Diagnosis not present

## 2021-07-10 DIAGNOSIS — R5381 Other malaise: Secondary | ICD-10-CM | POA: Diagnosis not present

## 2021-07-10 DIAGNOSIS — M2550 Pain in unspecified joint: Secondary | ICD-10-CM | POA: Diagnosis not present

## 2021-07-10 DIAGNOSIS — E669 Obesity, unspecified: Secondary | ICD-10-CM | POA: Diagnosis not present

## 2021-07-10 DIAGNOSIS — E1165 Type 2 diabetes mellitus with hyperglycemia: Secondary | ICD-10-CM | POA: Insufficient documentation

## 2021-07-10 DIAGNOSIS — Z79899 Other long term (current) drug therapy: Secondary | ICD-10-CM | POA: Diagnosis not present

## 2021-07-10 DIAGNOSIS — J45909 Unspecified asthma, uncomplicated: Secondary | ICD-10-CM | POA: Insufficient documentation

## 2021-07-10 DIAGNOSIS — R059 Cough, unspecified: Secondary | ICD-10-CM | POA: Insufficient documentation

## 2021-07-10 DIAGNOSIS — R110 Nausea: Secondary | ICD-10-CM | POA: Diagnosis not present

## 2021-07-10 DIAGNOSIS — Z87891 Personal history of nicotine dependence: Secondary | ICD-10-CM | POA: Diagnosis not present

## 2021-07-10 DIAGNOSIS — R197 Diarrhea, unspecified: Secondary | ICD-10-CM | POA: Insufficient documentation

## 2021-07-10 DIAGNOSIS — Z743 Need for continuous supervision: Secondary | ICD-10-CM | POA: Diagnosis not present

## 2021-07-10 DIAGNOSIS — R001 Bradycardia, unspecified: Secondary | ICD-10-CM | POA: Diagnosis not present

## 2021-07-10 DIAGNOSIS — I10 Essential (primary) hypertension: Secondary | ICD-10-CM | POA: Diagnosis not present

## 2021-07-10 DIAGNOSIS — G894 Chronic pain syndrome: Secondary | ICD-10-CM | POA: Diagnosis not present

## 2021-07-10 LAB — CBC WITH DIFFERENTIAL/PLATELET
Abs Immature Granulocytes: 0.06 10*3/uL (ref 0.00–0.07)
Basophils Absolute: 0 10*3/uL (ref 0.0–0.1)
Basophils Relative: 0 %
Eosinophils Absolute: 0.1 10*3/uL (ref 0.0–0.5)
Eosinophils Relative: 1 %
HCT: 34.8 % — ABNORMAL LOW (ref 36.0–46.0)
Hemoglobin: 10.6 g/dL — ABNORMAL LOW (ref 12.0–15.0)
Immature Granulocytes: 1 %
Lymphocytes Relative: 21 %
Lymphs Abs: 2.4 10*3/uL (ref 0.7–4.0)
MCH: 20.5 pg — ABNORMAL LOW (ref 26.0–34.0)
MCHC: 30.5 g/dL (ref 30.0–36.0)
MCV: 67.3 fL — ABNORMAL LOW (ref 80.0–100.0)
Monocytes Absolute: 0.4 10*3/uL (ref 0.1–1.0)
Monocytes Relative: 3 %
Neutro Abs: 8.2 10*3/uL — ABNORMAL HIGH (ref 1.7–7.7)
Neutrophils Relative %: 74 %
Platelets: 414 10*3/uL — ABNORMAL HIGH (ref 150–400)
RBC: 5.17 MIL/uL — ABNORMAL HIGH (ref 3.87–5.11)
RDW: 22 % — ABNORMAL HIGH (ref 11.5–15.5)
WBC: 11.1 10*3/uL — ABNORMAL HIGH (ref 4.0–10.5)
nRBC: 0 % (ref 0.0–0.2)

## 2021-07-10 LAB — URINALYSIS, ROUTINE W REFLEX MICROSCOPIC
Bilirubin Urine: NEGATIVE
Glucose, UA: 150 mg/dL — AB
Hgb urine dipstick: NEGATIVE
Ketones, ur: 20 mg/dL — AB
Leukocytes,Ua: NEGATIVE
Nitrite: NEGATIVE
Protein, ur: 30 mg/dL — AB
Specific Gravity, Urine: >1.046 — ABNORMAL HIGH (ref 1.005–1.030)
pH: 6 (ref 5.0–8.0)

## 2021-07-10 LAB — COMPREHENSIVE METABOLIC PANEL
ALT: 14 U/L (ref 0–44)
AST: 14 U/L — ABNORMAL LOW (ref 15–41)
Albumin: 3.4 g/dL — ABNORMAL LOW (ref 3.5–5.0)
Alkaline Phosphatase: 103 U/L (ref 38–126)
Anion gap: 9 (ref 5–15)
BUN: 11 mg/dL (ref 6–20)
CO2: 26 mmol/L (ref 22–32)
Calcium: 9.1 mg/dL (ref 8.9–10.3)
Chloride: 102 mmol/L (ref 98–111)
Creatinine, Ser: 0.77 mg/dL (ref 0.44–1.00)
GFR, Estimated: >60 mL/min (ref >60)
Glucose, Bld: 243 mg/dL — ABNORMAL HIGH (ref 70–99)
Potassium: 3.7 mmol/L (ref 3.5–5.1)
Sodium: 137 mmol/L (ref 135–145)
Total Bilirubin: 0.9 mg/dL (ref 0.3–1.2)
Total Protein: 8 g/dL (ref 6.5–8.1)

## 2021-07-10 LAB — RETICULOCYTES
Immature Retic Fract: 23 % — ABNORMAL HIGH (ref 2.3–15.9)
RBC.: 5.18 MIL/uL — ABNORMAL HIGH (ref 3.87–5.11)
Retic Count, Absolute: 74.1 10*3/uL (ref 19.0–186.0)
Retic Ct Pct: 1.4 % (ref 0.4–3.1)

## 2021-07-10 LAB — LIPASE, BLOOD: Lipase: 28 U/L (ref 11–51)

## 2021-07-10 MED ORDER — ONDANSETRON 4 MG PO TBDP: 4.0000 mg | ORAL_TABLET | Freq: Three times a day (TID) | ORAL | 0 refills | Status: DC | PRN

## 2021-07-10 MED ORDER — HYDROMORPHONE HCL 2 MG/ML IJ SOLN
2.0000 mg | INTRAMUSCULAR | Status: AC
  Administered 2021-07-10: 2 mg via INTRAVENOUS
  Filled 2021-07-10: qty 1

## 2021-07-10 MED ORDER — SODIUM CHLORIDE 0.45 % IV SOLN: INTRAVENOUS | Status: DC

## 2021-07-10 MED ORDER — KETOROLAC TROMETHAMINE 30 MG/ML IJ SOLN
30.0000 mg | Freq: Once | INTRAMUSCULAR | Status: AC
  Administered 2021-07-10: 30 mg via INTRAVENOUS
  Filled 2021-07-10: qty 1

## 2021-07-10 MED ORDER — ONDANSETRON HCL 4 MG/2ML IJ SOLN: 4.0000 mg | INTRAMUSCULAR | Status: DC | PRN

## 2021-07-10 MED ORDER — IOHEXOL 350 MG/ML SOLN
100.0000 mL | Freq: Once | INTRAVENOUS | Status: AC | PRN
  Administered 2021-07-10: 100 mL via INTRAVENOUS

## 2021-07-10 NOTE — ED Provider Notes (Signed)
[facility] DEPT Provider Note   CSN: [id number] Arrival date & time: 07/10/21  0846     History Chief Complaint  Patient presents with   Nausea   Emesis   Sickle Cell Pain Crisis    Cynthia Rosales is a 40 y.o. female.  The history is provided by the patient.  Emesis Severity:  Severe Duration:  2 weeks Timing:  Intermittent Quality:  Stomach contents Progression:  Unchanged Chronicity:  New Relieved by:  Nothing Worsened by:  Liquids Ineffective treatments:  None tried Associated symptoms: abdominal pain (central), arthralgias, cough, diarrhea, fever (T max 102 3 days ago) and myalgias   Associated symptoms: no chills and no sore throat   Risk factors: prior abdominal surgery (recent hysterectomy, h/o cholecystectomy)   Sickle Cell Pain Crisis Location:  Diffuse Severity:  Moderate Onset quality:  Gradual Duration:  2 weeks Similar to previous crisis episodes: yes   Timing:  Constant Progression:  Worsening Chronicity:  New Sickle cell genotype:  Unable to specify (not clearly documented in chart) Context comment:  Unable to take her typical pain medication (oxycodone 30 mg) due to nausea/vomting Relieved by:  Nothing Worsened by:  Nothing Ineffective treatments:  None tried Associated symptoms: chest pain, cough, fever (T max 102 3 days ago), nausea and vomiting   Associated symptoms: no shortness of breath and no sore throat       Past Medical History:  Diagnosis Date   Abscess of bursa, left elbow 06/2013   Treated with I and D/antibiotics.    Anemia    Arthritis    Asthma    Depression    History of blood transfusion    "after I had one of my kids"   History of kidney stones    Hypertension    Ovarian cyst    Schizophrenia ([place])    Sickle cell anemia (HCC)    Type II diabetes mellitus ([place])     Patient Active Problem List   Diagnosis Date Noted   Uterine leiomyoma 12/03/2020   S/P abdominal hysterectomy  12/03/2020   Bile leak, postoperative    Choledocholithiasis    Intra-abdominal fluid collection    Anemia 04/28/2018   Morbid obesity with body mass index (BMI) of 50.0 to 59.9 in adult [facility]) 04/02/2018   Mass of uterus 04/02/2018   Transaminitis 04/02/2018   Acute cholecystitis s/p lap cholecystectomy 04/03/2018 04/01/2018   Pregnancy, ectopic, cornual or cervical 07/21/2017   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   Neurogenic bladder 08/23/2015   Bilateral carpal tunnel syndrome 08/23/2015   Paraparesis of both lower limbs ([place]) 07/14/2015   Ileus, postoperative ([place]) 12/25/2014   Diabetes mellitus type 2 in obese ([place]) 12/25/2014   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/24/2014   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   Ileus of unspecified type ([place])    Atelectasis    Bowel obstruction ([place])    Encounter for central line care    Ile[facility])    Intestinal occlusion ([place])    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   Headache 11/19/2014   Neck pain 11/19/2014   Right sided weakness 11/19/2014   Paresthesias 11/19/2014   Nightmares 09/11/2014   HTN (hypertension), benign 03/28/2014   Nephrolithiasis 03/27/2014   Diabetes mellitus ([place]) 03/27/2014   Abdominal pain 03/27/2014    Past Surgical History:  Procedure Laterality Date   APPENDECTOMY  2013   BILIARY STENT PLACEMENT N/A 05/02/2018   Procedure: BILIARY STENT PLACEMENT;  Surgeon: Doran Stabler, MD;  Location: [facility];  Service: Gastroenterology;  Laterality: N/A;   BOWEL RESECTION  12/03/2020   Procedure: SMALL BOWEL RESECTION;  Surgeon: Kieth Brightly, Arta Bruce, MD;  Location: [facility];  Service: General;;   CARPAL TUNNEL RELEASE     CESAREAN SECTION  2000; 2007; 2011   CHOLECYSTECTOMY N/A 04/03/2018   Procedure: LAPAROSCOPIC CHOLECYSTECTOMY;  Surgeon: Rolm Bookbinder, MD;  Location: [facility];  Service: General;  Laterality: N/A;   [place] N/A 10/22/2020   Procedure:  DILATATION & CURETTAGE/HYSTEROSCOPY WITH MYOSURE;  Surgeon: Cheri Fowler, MD;  Location: [place];  Service: Gynecology;  Laterality: N/A;   DILATION AND CURETTAGE OF UTERUS     ERCP N/A 05/02/2018   Procedure: ENDOSCOPIC RETROGRADE CHOLANGIOPANCREATOGRAPHY (ERCP);  Surgeon: Doran Stabler, MD;  Location: [facility];  Service: Gastroenterology;  Laterality: N/A;   ERCP N/A 07/26/2018   Procedure: ENDOSCOPIC RETROGRADE CHOLANGIOPANCREATOGRAPHY (ERCP);  Surgeon: Irene Shipper, MD;  Location: [facility];  Service: Endoscopy;  Laterality: N/A;  stent removal   IR RADIOLOGIST EVAL & MGMT  05/19/2018   LAMINECTOMY N/A 12/13/2014   Procedure: CERVICAL LAMINECTOMY FOR INTRADURAL TUMOR;  Surgeon: Charlie Pitter, MD;  Location: [facility];  Service: Neurosurgery;  Laterality: N/A;  posterior   LAPAROSCOPY N/A 07/20/2017   Procedure: LAPAROSCOPY OPERATIVE WITH WEDGE RESECTION RIGHT CORNUA AND PARTIAL SALPINGECTOMY;  Surgeon: [name], MD;  Location: [facility];  Service: Gynecology;  Laterality: N/A;   REDUCTION MAMMAPLASTY Bilateral 1998   REMOVAL OF STONES  05/02/2018   Procedure: REMOVAL OF STONES;  Surgeon: Doran Stabler, MD;  Location: [facility];  Service: Gastroenterology;;   [name]  05/02/2018   Procedure: [id number];  Surgeon: Doran Stabler, MD;  Location: [facility];  Service: Gastroenterology;;   Lavell Islam REMOVAL  07/26/2018   Procedure: STENT REMOVAL;  Surgeon: Irene Shipper, MD;  Location: [facility];  Service: Endoscopy;;   TOTAL LAPAROSCOPIC HYSTERECTOMY WITH SALPINGECTOMY Bilateral 12/03/2020   Procedure: ATTEMPTED  LAPAROSCOPIC, OPEN ABDOMINAL HYSTERECTOMY WITH BILATERAL SALPINGO-OOPHERECTOMY AND LYSIS OF ADHESIONS;  Surgeon: Cheri Fowler, MD;  Location: [facility];  Service: Gynecology;  Laterality: Bilateral;     OB History     Gravida  5   Para  3   Term  2   Preterm  1   AB      Living  3      SAB      IAB      Ectopic      Multiple       Live Births  3           Family History  Problem Relation Age of Onset   Diabetes Mother    Hypertension Mother    Breast cancer Maternal Aunt    Ovarian cancer Maternal Grandmother    Breast cancer Maternal Aunt    Migraines Neg Hx     Social History   Tobacco Use   Smoking status: Former    Years: 0.00    Types: Cigarettes   Smokeless tobacco: Never   Tobacco comments:    03/27/2014 "smoked ~ 1 cigarette/day; quit in ~ 2013"  Vaping Use   Vaping Use: Never used  Substance Use Topics   Alcohol use: Never    Alcohol/week: 0.0 standard drinks   Drug use: No    Home Medications Prior to Admission medications   Medication Sig Start Date End Date Taking? Authorizing  Provider  diazepam (VALIUM) 5 MG tablet Take 5 mg by mouth 2 (two) times daily as needed for muscle spasms.    [provider]  hydrochlorothiazide (HYDRODIURIL) 25 MG tablet Take 25 mg by mouth daily.  04/27/18   [provider]  NOVOLOG MIX 70/30 FLEXPEN (70-30) 100 UNIT/ML FlexPen Inject 36 Units into the skin in the morning, at noon, and at bedtime.  02/28/20   [provider]  oxycodone (ROXICODONE) 30 MG immediate release tablet Take 1 tablet (30 mg total) by mouth every 6 (six) hours as needed for pain. Patient taking differently: Take 45 mg by mouth every 4 (four) hours as needed for pain. 06/01/20   Dorena Dew, FNP  sitaGLIPtin (JANUVIA) 100 MG tablet Take 100 mg by mouth daily.    [provider]    Allergies    Amoxicillin, Buprenorphine hcl, and Morphine and related  Review of Systems   Review of Systems  Constitutional:  Positive for fever (T max 102 3 days ago). Negative for chills.  HENT:  Negative for ear pain and sore throat.   Eyes:  Negative for pain and visual disturbance.  Respiratory:  Positive for cough. Negative for shortness of breath.   Cardiovascular:  Positive for chest pain. Negative for palpitations.  Gastrointestinal:  Positive for  abdominal pain (central), diarrhea, nausea and vomiting.  Genitourinary:  Negative for dysuria and hematuria.  Musculoskeletal:  Positive for arthralgias and myalgias. Negative for back pain.  Skin:  Negative for color change and rash.  Neurological:  Positive for weakness (lower extremity weakness at baseline due to previous tumor). Negative for seizures and syncope.  All other systems reviewed and are negative.  Physical Exam Updated Vital Signs Pulse 85   Temp 98.7 F (37.1 C) (Oral)   Ht '5\' 3"'$  (1.6 m)   Wt (!) 141 kg   SpO2 100%   BMI 55.06 kg/m   Physical Exam Constitutional:      Appearance: She is diaphoretic.  HENT:     Head: Normocephalic and atraumatic.     Nose: Nose normal.  Eyes:     Conjunctiva/sclera: Conjunctivae normal.  Cardiovascular:     Rate and Rhythm: Normal rate and regular rhythm.     Heart sounds: Normal heart sounds. No murmur heard. Pulmonary:     Effort: Pulmonary effort is normal.     Breath sounds: Normal breath sounds.  Abdominal:     General: There is no distension.     Palpations: Abdomen is soft. There is no mass.     Tenderness: There is no abdominal tenderness.  Musculoskeletal:        General: No swelling or deformity.  Skin:    General: Skin is warm.  Neurological:     Mental Status: She is oriented to person, place, and time. Mental status is at baseline.  Psychiatric:        Mood and Affect: Mood normal.        Behavior: Behavior normal.    ED Results / Procedures / Treatments   Labs (all labs ordered are listed, but only abnormal results are displayed) Labs Reviewed  COMPREHENSIVE METABOLIC PANEL - Abnormal; Notable for the following components:      Result Value   Glucose, Bld 243 (*)    Albumin 3.4 (*)    AST 14 (*)    All other components within normal limits  CBC WITH DIFFERENTIAL/PLATELET - Abnormal; Notable for the following components:   WBC 11.1 (*)  RBC 5.17 (*)    Hemoglobin 10.6 (*)    HCT 34.8 (*)     MCV 67.3 (*)    MCH 20.5 (*)    RDW 22.0 (*)    Platelets 414 (*)    Neutro Abs 8.2 (*)    All other components within normal limits  RETICULOCYTES - Abnormal; Notable for the following components:   RBC. 5.18 (*)    Immature Retic Fract 23.0 (*)    All other components within normal limits  URINALYSIS, ROUTINE W REFLEX MICROSCOPIC - Abnormal; Notable for the following components:   APPearance HAZY (*)    Specific Gravity, Urine >1.046 (*)    Glucose, UA 150 (*)    Ketones, ur 20 (*)    Protein, ur 30 (*)    Bacteria, UA RARE (*)    All other components within normal limits  LIPASE, BLOOD    EKG EKG Interpretation  Date/Time:  Thursday July 10 2021 09:54:03 EDT Ventricular Rate:  92 PR Interval:  149 QRS Duration: 84 QT Interval:  350 QTC Calculation: 433 R Axis:   78 Text Interpretation: Sinus rhythm Minimal ST elevation, inferior leads axis normal No acute ischemia Confirmed by Lorre Munroe ([id number]) on 07/10/2021 10:34:59 AM  Radiology CT Abdomen Pelvis W Contrast  Result Date: 07/10/2021 CLINICAL DATA:  Suspected bowel obstruction EXAM: CT ABDOMEN AND PELVIS WITH CONTRAST TECHNIQUE: Multidetector CT imaging of the abdomen and pelvis was performed using the standard protocol following bolus administration of intravenous contrast. CONTRAST:  149m OMNIPAQUE IOHEXOL 350 MG/ML SOLN COMPARISON:  CT abdomen/pelvis 10/03/2019 FINDINGS: Lower chest: The lung bases are clear. The imaged heart is unremarkable. Hepatobiliary: The liver is normal. The gallbladder is surgically absent. Prominence of the intra and extrahepatic bile ducts is likely due to the history of cholecystectomy, similar to the prior study. Pancreas: Normal. Spleen: Normal. Adrenals/Urinary Tract: The adrenals are unremarkable. The kidneys are normal, with no focal lesions, calculi, hydronephrosis, or hydroureter. The bladder is unremarkable. Stomach/Bowel: The stomach is unremarkable. Chain sutures are seen in the lower  mid abdomen likely reflecting partial bowel resection. There is no evidence of bowel obstruction. The patient is status post appendectomy. Vascular/Lymphatic: There is minimal calcification of the nonaneurysmal abdominal aorta. The main portal and splenic veins are patent. There is no abdominal or pelvic lymphadenopathy. Reproductive: There has been interval hysterectomy. There is no adnexal mass. Other: No abdominal wall hernia or abnormality. No abdominopelvic ascites. Musculoskeletal: There is no acute osseous abnormality. IMPRESSION: No evidence of bowel obstruction or other acute pathology in the abdomen or pelvis. Electronically Signed   By: PValetta MoleMD   On: 07/10/2021 11:25    Procedures Procedures   Medications Ordered in ED Medications  0.45 % sodium chloride infusion (has no administration in time range)  HYDROmorphone (DILAUDID) injection 2 mg (has no administration in time range)  HYDROmorphone (DILAUDID) injection 2 mg (has no administration in time range)  ondansetron (ZOFRAN) injection 4 mg (has no administration in time range)    ED Course  I have reviewed the triage vital signs and the nursing notes.  Pertinent labs & imaging results that were available during my care of the patient were reviewed by me and considered in my medical decision making (see chart for details).    MDM Rules/Calculators/A&P                           SANNISHA MUHLBACHhas a  history of sickle cell anemia.  She also has diabetes and has had recent abdominal surgery.  She presented with 2 weeks of abdominal pain, nausea, vomiting as well as sickle cell pain.  Vital signs were within normal limits, and she was given IV hydration as well as pain medications and antiemetics.  White count very slightly elevated.  Hemoglobin at an acceptable level.  Mildly hyperglycemic without other significant electrolyte abnormalities.  Urinalysis and CT imaging were not suggestive of acute pathology.  I asked her to  follow with her primary care doctor.  Gastroenterology would be another consideration.  Adhesions could be another possible etiology of her symptoms.  Nonetheless, she is symptomatically improved, and she can be discharged home. Final Clinical Impression(s) / ED Diagnoses Final diagnoses:  Sickle cell pain crisis ([place])  Nausea vomiting and diarrhea  Hyperglycemia due to diabetes mellitus ([place])    Rx / DC Orders ED Discharge Orders          Ordered    ondansetron (ZOFRAN-ODT) 4 MG disintegrating tablet  Every 8 hours PRN        07/10/21 1418             Arnaldo Natal, MD 07/10/21 1430

## 2021-07-10 NOTE — ED Notes (Signed)
Patient transported to CT 

## 2021-07-10 NOTE — ED Triage Notes (Addendum)
Patient BIB [facility] from home. Patient c/o of nusea and vomiting 10 days ago. Hx of diabetes, hypertension. 20g in right hand placed by EMS. '4mg'$  of zofran and 570m of NS given by EMS. 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    220-CBG 144/90 100% RA  107-HR 20-RR

## 2021-07-11 DIAGNOSIS — G894 Chronic pain syndrome: Secondary | ICD-10-CM | POA: Diagnosis not present

## 2021-07-12 DIAGNOSIS — G894 Chronic pain syndrome: Secondary | ICD-10-CM | POA: Diagnosis not present

## 2021-07-13 DIAGNOSIS — G894 Chronic pain syndrome: Secondary | ICD-10-CM | POA: Diagnosis not present

## 2021-07-14 DIAGNOSIS — G894 Chronic pain syndrome: Secondary | ICD-10-CM | POA: Diagnosis not present

## 2021-07-15 DIAGNOSIS — G894 Chronic pain syndrome: Secondary | ICD-10-CM | POA: Diagnosis not present

## 2021-07-16 DIAGNOSIS — G894 Chronic pain syndrome: Secondary | ICD-10-CM | POA: Diagnosis not present

## 2021-07-17 DIAGNOSIS — G894 Chronic pain syndrome: Secondary | ICD-10-CM | POA: Diagnosis not present

## 2021-07-18 DIAGNOSIS — G894 Chronic pain syndrome: Secondary | ICD-10-CM | POA: Diagnosis not present

## 2021-07-19 DIAGNOSIS — G894 Chronic pain syndrome: Secondary | ICD-10-CM | POA: Diagnosis not present

## 2021-07-20 DIAGNOSIS — G894 Chronic pain syndrome: Secondary | ICD-10-CM | POA: Diagnosis not present

## 2021-07-21 DIAGNOSIS — G894 Chronic pain syndrome: Secondary | ICD-10-CM | POA: Diagnosis not present

## 2021-07-22 DIAGNOSIS — G894 Chronic pain syndrome: Secondary | ICD-10-CM | POA: Diagnosis not present

## 2021-07-23 DIAGNOSIS — G894 Chronic pain syndrome: Secondary | ICD-10-CM | POA: Diagnosis not present

## 2021-07-24 DIAGNOSIS — G894 Chronic pain syndrome: Secondary | ICD-10-CM | POA: Diagnosis not present

## 2021-07-24 DIAGNOSIS — R109 Unspecified abdominal pain: Secondary | ICD-10-CM | POA: Diagnosis not present

## 2021-07-24 DIAGNOSIS — R69 Illness, unspecified: Secondary | ICD-10-CM | POA: Diagnosis not present

## 2021-07-24 DIAGNOSIS — R197 Diarrhea, unspecified: Secondary | ICD-10-CM | POA: Diagnosis not present

## 2021-07-25 DIAGNOSIS — G894 Chronic pain syndrome: Secondary | ICD-10-CM | POA: Diagnosis not present

## 2021-07-26 DIAGNOSIS — G894 Chronic pain syndrome: Secondary | ICD-10-CM | POA: Diagnosis not present

## 2021-07-27 DIAGNOSIS — G894 Chronic pain syndrome: Secondary | ICD-10-CM | POA: Diagnosis not present

## 2021-07-28 DIAGNOSIS — G894 Chronic pain syndrome: Secondary | ICD-10-CM | POA: Diagnosis not present

## 2021-07-29 ENCOUNTER — Emergency Department (HOSPITAL_COMMUNITY)
Admission: EM | Admit: 2021-07-29 | Discharge: 2021-07-30 | Disposition: A | Discharge status: Home / Self Care | Payer: Medicare HMO | Attending: Emergency Medicine | Admitting: Emergency Medicine

## 2021-07-29 ENCOUNTER — Other Ambulatory Visit: Payer: Self-pay

## 2021-07-29 ENCOUNTER — Encounter (HOSPITAL_COMMUNITY): Payer: Self-pay | Admitting: Emergency Medicine

## 2021-07-29 DIAGNOSIS — E119 Type 2 diabetes mellitus without complications: Secondary | ICD-10-CM | POA: Diagnosis not present

## 2021-07-29 DIAGNOSIS — R197 Diarrhea, unspecified: Secondary | ICD-10-CM | POA: Insufficient documentation

## 2021-07-29 DIAGNOSIS — R1084 Generalized abdominal pain: Secondary | ICD-10-CM | POA: Diagnosis not present

## 2021-07-29 DIAGNOSIS — Z79899 Other long term (current) drug therapy: Secondary | ICD-10-CM | POA: Insufficient documentation

## 2021-07-29 DIAGNOSIS — R509 Fever, unspecified: Secondary | ICD-10-CM | POA: Diagnosis not present

## 2021-07-29 DIAGNOSIS — R112 Nausea with vomiting, unspecified: Secondary | ICD-10-CM | POA: Insufficient documentation

## 2021-07-29 DIAGNOSIS — J45909 Unspecified asthma, uncomplicated: Secondary | ICD-10-CM | POA: Insufficient documentation

## 2021-07-29 DIAGNOSIS — Z87891 Personal history of nicotine dependence: Secondary | ICD-10-CM | POA: Diagnosis not present

## 2021-07-29 DIAGNOSIS — Z794 Long term (current) use of insulin: Secondary | ICD-10-CM | POA: Diagnosis not present

## 2021-07-29 DIAGNOSIS — I10 Essential (primary) hypertension: Secondary | ICD-10-CM | POA: Insufficient documentation

## 2021-07-29 DIAGNOSIS — G894 Chronic pain syndrome: Secondary | ICD-10-CM | POA: Diagnosis not present

## 2021-07-29 LAB — CBC WITH DIFFERENTIAL/PLATELET
Abs Immature Granulocytes: 0.02 10*3/uL (ref 0.00–0.07)
Basophils Absolute: 0.1 10*3/uL (ref 0.0–0.1)
Basophils Relative: 1 %
Eosinophils Absolute: 0 10*3/uL (ref 0.0–0.5)
Eosinophils Relative: 0 %
HCT: 36.1 % (ref 36.0–46.0)
Hemoglobin: 11.1 g/dL — ABNORMAL LOW (ref 12.0–15.0)
Immature Granulocytes: 0 %
Lymphocytes Relative: 25 %
Lymphs Abs: 2.3 10*3/uL (ref 0.7–4.0)
MCH: 20.7 pg — ABNORMAL LOW (ref 26.0–34.0)
MCHC: 30.7 g/dL (ref 30.0–36.0)
MCV: 67.2 fL — ABNORMAL LOW (ref 80.0–100.0)
Monocytes Absolute: 0.5 10*3/uL (ref 0.1–1.0)
Monocytes Relative: 5 %
Neutro Abs: 6.4 10*3/uL (ref 1.7–7.7)
Neutrophils Relative %: 69 %
Platelets: 436 10*3/uL — ABNORMAL HIGH (ref 150–400)
RBC: 5.37 MIL/uL — ABNORMAL HIGH (ref 3.87–5.11)
RDW: 20.8 % — ABNORMAL HIGH (ref 11.5–15.5)
WBC: 9.3 10*3/uL (ref 4.0–10.5)
nRBC: 0 % (ref 0.0–0.2)

## 2021-07-29 LAB — COMPREHENSIVE METABOLIC PANEL
ALT: 14 U/L (ref 0–44)
AST: 14 U/L — ABNORMAL LOW (ref 15–41)
Albumin: 3.1 g/dL — ABNORMAL LOW (ref 3.5–5.0)
Alkaline Phosphatase: 91 U/L (ref 38–126)
Anion gap: 10 (ref 5–15)
BUN: 11 mg/dL (ref 6–20)
CO2: 24 mmol/L (ref 22–32)
Calcium: 9.2 mg/dL (ref 8.9–10.3)
Chloride: 99 mmol/L (ref 98–111)
Creatinine, Ser: 0.79 mg/dL (ref 0.44–1.00)
GFR, Estimated: >60 mL/min (ref >60)
Glucose, Bld: 320 mg/dL — ABNORMAL HIGH (ref 70–99)
Potassium: 3.5 mmol/L (ref 3.5–5.1)
Sodium: 133 mmol/L — ABNORMAL LOW (ref 135–145)
Total Bilirubin: 0.7 mg/dL (ref 0.3–1.2)
Total Protein: 7.5 g/dL (ref 6.5–8.1)

## 2021-07-29 LAB — LIPASE, BLOOD: Lipase: 26 U/L (ref 11–51)

## 2021-07-29 NOTE — ED Triage Notes (Signed)
Pt c/o abdominal pain, nausea/vomiting, diarrhea and fever x 2 weeks.

## 2021-07-29 NOTE — ED Provider Notes (Signed)
Emergency Medicine Provider Triage Evaluation Note  Cynthia Rosales , a 40 y.o. female  was evaluated in triage.  Pt complains of abd pain, nvd.  Review of Systems  Positive: Abd pain, nvd, fever Negative: constipation  Physical Exam  BP (!) 174/116 (BP Location: Left Arm)   Pulse (!) 117   Temp 99.4 F (37.4 C) (Oral)   Resp 18   LMP 03/21/2020   SpO2 100%  Gen:   Awake, no distress   Resp:  Normal effort  MSK:   Moves extremities without difficulty  Other:  Left sided abd ttp with guarding  Medical Decision Making  Medically screening exam initiated at 5:48 PM.  Appropriate orders placed.  Cynthia Rosales was informed that the remainder of the evaluation will be completed by another provider, this initial triage assessment does not replace that evaluation, and the importance of remaining in the ED until their evaluation is complete.     Rodney Booze, PA-C 07/29/21 [place], [place], DO 07/29/21 2249

## 2021-07-30 DIAGNOSIS — R1084 Generalized abdominal pain: Secondary | ICD-10-CM | POA: Diagnosis not present

## 2021-07-30 DIAGNOSIS — R112 Nausea with vomiting, unspecified: Secondary | ICD-10-CM | POA: Diagnosis not present

## 2021-07-30 LAB — URINALYSIS, ROUTINE W REFLEX MICROSCOPIC
Bilirubin Urine: NEGATIVE
Glucose, UA: >=500 mg/dL — AB
Ketones, ur: NEGATIVE mg/dL
Leukocytes,Ua: NEGATIVE
Nitrite: NEGATIVE
Protein, ur: 30 mg/dL — AB
Specific Gravity, Urine: 1.028 (ref 1.005–1.030)
pH: 5 (ref 5.0–8.0)

## 2021-07-30 LAB — RAPID URINE DRUG SCREEN, HOSP PERFORMED
Amphetamines: NOT DETECTED
Barbiturates: NOT DETECTED
Benzodiazepines: POSITIVE — AB
Cocaine: NOT DETECTED
Opiates: POSITIVE — AB
Tetrahydrocannabinol: POSITIVE — AB

## 2021-07-30 MED ORDER — METOCLOPRAMIDE HCL 10 MG PO TABS: 10.0000 mg | ORAL_TABLET | Freq: Four times a day (QID) | ORAL | 0 refills | Status: DC

## 2021-07-30 MED ORDER — METOCLOPRAMIDE HCL 5 MG/ML IJ SOLN
10.0000 mg | Freq: Once | INTRAMUSCULAR | Status: AC
  Administered 2021-07-30: 10 mg via INTRAVENOUS
  Filled 2021-07-30: qty 2

## 2021-07-30 MED ORDER — DIPHENHYDRAMINE HCL 50 MG/ML IJ SOLN
12.5000 mg | Freq: Once | INTRAMUSCULAR | Status: AC
  Administered 2021-07-30: 12.5 mg via INTRAVENOUS
  Filled 2021-07-30: qty 1

## 2021-07-30 MED ORDER — SODIUM CHLORIDE 0.9 % IV SOLN
1000.0000 mL | INTRAVENOUS | Status: DC
  Administered 2021-07-30: 1000 mL via INTRAVENOUS

## 2021-07-30 MED ORDER — ALUM & MAG HYDROXIDE-SIMETH 200-200-20 MG/5ML PO SUSP
30.0000 mL | Freq: Once | ORAL | Status: AC
  Administered 2021-07-30: 30 mL via ORAL
  Filled 2021-07-30: qty 30

## 2021-07-30 MED ORDER — HYOSCYAMINE SULFATE 0.125 MG SL SUBL
0.2500 mg | SUBLINGUAL_TABLET | Freq: Once | SUBLINGUAL | Status: AC
  Administered 2021-07-30: 0.25 mg via SUBLINGUAL
  Filled 2021-07-30: qty 2

## 2021-07-30 MED ORDER — SODIUM CHLORIDE 0.9 % IV BOLUS (SEPSIS)
1000.0000 mL | Freq: Once | INTRAVENOUS | Status: AC
  Administered 2021-07-30: 1000 mL via INTRAVENOUS

## 2021-07-30 MED ORDER — LIDOCAINE VISCOUS HCL 2 % MT SOLN
15.0000 mL | Freq: Once | OROMUCOSAL | Status: AC
  Administered 2021-07-30: 15 mL via ORAL
  Filled 2021-07-30: qty 15

## 2021-07-30 NOTE — ED Notes (Signed)
Patient left without waiting for discharge instructions/documents.

## 2021-07-30 NOTE — ED Provider Notes (Signed)
[facility] EMERGENCY DEPARTMENT Provider Note  CSN: [id number] Arrival date & time: 07/29/21 1719  Chief Complaint(s) Abdominal Pain  HPI Cynthia Rosales is a 40 y.o. female    Abdominal Pain Pain location:  Generalized Pain quality: aching and cramping   Pain radiates to:  Does not radiate Pain severity:  Moderate Onset quality:  Gradual Duration: 2 months. Timing:  Constant Progression:  Waxing and waning Chronicity:  Recurrent Context: previous surgery   Context: not sick contacts and not suspicious food intake   Relieved by:  Nothing Worsened by:  Eating Associated symptoms: diarrhea, fever, nausea and vomiting   Reports decreased PO tolerance throughout all that time.  Past Medical History Past Medical History:  Diagnosis Date   Abscess of bursa, left elbow 06/2013   Treated with I and D/antibiotics.    Anemia    Arthritis    Asthma    Depression    History of blood transfusion    "after I had one of my kids"   History of kidney stones    Hypertension    Ovarian cyst    Schizophrenia ([place])    Sickle cell anemia (HCC)    Type II diabetes mellitus ([place])    Patient Active Problem List   Diagnosis Date Noted   Uterine leiomyoma 12/03/2020   S/P abdominal hysterectomy 12/03/2020   Bile leak, postoperative    Choledocholithiasis    Intra-abdominal fluid collection    Anemia 04/28/2018   Morbid obesity with body mass index (BMI) of 50.0 to 59.9 in adult [facility]) 04/02/2018   Mass of uterus 04/02/2018   Transaminitis 04/02/2018   Acute cholecystitis s/p lap cholecystectomy 04/03/2018 04/01/2018   Pregnancy, ectopic, cornual or cervical 07/21/2017   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   Neurogenic bladder 08/23/2015   Bilateral carpal tunnel syndrome 08/23/2015   Paraparesis of both lower limbs ([place]) 07/14/2015   Ileus, postoperative ([place]) 12/25/2014   Diabetes mellitus type 2 in obese ([place]) 12/25/2014   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[place]) 12/24/2014   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 Authorizing Provider  metoCLOPramide (REGLAN) 10 MG tablet Take 1 tablet (10 mg total) by mouth every 6 (six) hours. 07/30/21  Yes Mandolin Falwell, Grayce Sessions, MD  diazepam (VALIUM) 5 MG tablet Take 5 mg by mouth 2 (two) times daily as needed for muscle spasms.    [provider]  hydrochlorothiazide (HYDRODIURIL) 25 MG tablet Take 25 mg by mouth daily.  04/27/18   [provider]  NOVOLOG MIX 70/30 FLEXPEN (70-30) 100 UNIT/ML FlexPen Inject 36 Units into the skin in the morning, at noon, and at bedtime.  02/28/20   [provider]  ondansetron (ZOFRAN-ODT) 4 MG disintegrating tablet Take 1 tablet (4 mg total) by mouth every 8 (eight) hours as needed for nausea or vomiting. 07/10/21   Arnaldo Natal, MD  oxycodone (ROXICODONE) 30 MG immediate release tablet Take 1 tablet (30 mg total) by mouth every 6 (six) hours as needed for pain. Patient taking differently: Take 45 mg by mouth every 4 (four) hours as needed for pain. 06/01/20   Dorena Dew, FNP  sitaGLIPtin (JANUVIA) 100 MG tablet Take 100 mg by mouth daily.    [provider]                                                                                                                                    Past Surgical History Past Surgical History:  Procedure Laterality Date   APPENDECTOMY   2013   BILIARY STENT PLACEMENT N/A 05/02/2018   Procedure: BILIARY STENT PLACEMENT;  Surgeon: Doran Stabler, MD;  [facility];  Service: Gastroenterology;  Laterality: N/A;   BOWEL RESECTION  12/03/2020   Procedure: SMALL BOWEL RESECTION;  Surgeon: Kieth Brightly, Arta Bruce, MD;  Location: [place];  Service: General;;   CARPAL TUNNEL RELEASE     CESAREAN SECTION  2000; 2007; 2011   CHOLECYSTECTOMY N/A 04/03/2018   Procedure: LAPAROSCOPIC CHOLECYSTECTOMY;  Surgeon: Rolm Bookbinder, MD;  Location: [place];  Service: General;  Laterality: N/A;   [place] N/A 10/22/2020   Procedure: DILATATION & CURETTAGE/HYSTEROSCOPY WITH MYOSURE;  Surgeon: Cheri Fowler, MD;  Location: [facility];  Service: Gynecology;  Laterality: N/A;   DILATION AND CURETTAGE OF UTERUS     ERCP N/A 05/02/2018   Procedure: ENDOSCOPIC RETROGRADE CHOLANGIOPANCREATOGRAPHY (ERCP);  Surgeon: Doran Stabler, MD;  [facility];  Service: Gastroenterology;  Laterality: N/A;   ERCP N/A 07/26/2018   Procedure: ENDOSCOPIC RETROGRADE CHOLANGIOPANCREATOGRAPHY (ERCP);  Surgeon: Irene Shipper, MD;  Location[facility];  Service: Endoscopy;  Laterality: N/A;  stent removal   IR RADIOLOGIST EVAL & MGMT  05/19/2018   LAMINECTOMY N/A 12/13/2014   Procedure: CERVICAL LAMINECTOMY FOR INTRADURAL TUMOR;  Surgeon: Charlie Pitter, MD;  Location: [place] [facility];  Service: Neurosurgery;  Laterality: N/A;  posterior   LAPAROSCOPY N/A 07/20/2017   Procedure: LAPAROSCOPY OPERATIVE WITH WEDGE RESECTION RIGHT CORNUA AND PARTIAL SALPINGECTOMY;  Surgeon: [name], MD;  Location: [place] [facility];  Service: Gynecology;  Laterality: N/A;   REDUCTION MAMMAPLASTY Bilateral 1998   REMOVAL OF STONES  05/02/2018   Procedure: REMOVAL OF STONES;  Surgeon: Doran Stabler, MD;  [facility];  Service: Gastroenterology;;   [name]  05/02/2018   Procedure: [id number];  Surgeon: Doran Stabler, MD;   [facility];  Service: Gastroenterology;;   Lavell Islam REMOVAL  07/26/2018   Procedure: STENT REMOVAL;  Surgeon: Irene Shipper, MD;  [facility];  Service: Endoscopy;;   TOTAL LAPAROSCOPIC HYSTERECTOMY WITH SALPINGECTOMY Bilateral 12/03/2020   Procedure: ATTEMPTED  LAPAROSCOPIC, OPEN ABDOMINAL HYSTERECTOMY WITH BILATERAL SALPINGO-OOPHERECTOMY AND LYSIS OF ADHESIONS;  Surgeon: Cheri Fowler, MD;  Location: [place];  Service: Gynecology;  Laterality: Bilateral;   Family History Family History  Problem Relation Age of Onset   Diabetes Mother    Hypertension Mother    Breast cancer Maternal Aunt    Ovarian cancer Maternal Grandmother    Breast cancer Maternal Aunt    Migraines Neg Hx     Social History Social History   Tobacco Use   Smoking status: Former    Years: 0.00    Types: Cigarettes   Smokeless tobacco: Never   Tobacco comments:    03/27/2014 "smoked ~ 1 cigarette/day; quit in ~ 2013"  Vaping Use   Vaping Use: Never used  Substance Use Topics   Alcohol use: Never    Alcohol/week: 0.0 standard drinks   Drug use: No   Allergies Amoxicillin, Buprenorphine hcl, and Morphine and related  Review of Systems Review of Systems  Constitutional:  Positive for fever.  Gastrointestinal:  Positive for abdominal pain, diarrhea, nausea and vomiting.  All other systems are reviewed and are negative for acute change except as noted in the HPI  Physical Exam Vital Signs  I have reviewed the triage vital signs BP (!) 137/91   Pulse 96   Temp (!) 97.2 F (36.2 C) (Temporal)   Resp (!) 24   LMP 03/21/2020   SpO2 99%   Physical Exam Vitals reviewed.  Constitutional:      General: She is not in acute distress.    Appearance: She is well-developed. She is morbidly obese. She is not diaphoretic.  HENT:     Head: Normocephalic and atraumatic.     Nose: Nose normal.  Eyes:     General: No scleral icterus.       Right eye: No discharge.        Left eye: No  discharge.     Conjunctiva/sclera: Conjunctivae normal.     Pupils: Pupils are equal, round, and reactive to light.  Cardiovascular:     Rate and Rhythm: Normal rate and regular rhythm.     Heart sounds: No murmur heard.   No friction rub. No gallop.  Pulmonary:     Effort: Pulmonary effort is normal. No respiratory distress.     Breath sounds: Normal breath sounds. No stridor. No rales.  Abdominal:     General: There is no distension.     Palpations: Abdomen is soft.     Tenderness: There is no abdominal tenderness. There is no guarding or rebound.  Musculoskeletal:        General: No tenderness.     Cervical back: Normal range of motion and neck supple.  Skin:    General: Skin is warm and dry.     Findings: No erythema or rash.  Neurological:     Mental Status: She is alert and oriented to person, place, and time.    ED Results and Treatments Labs (all labs ordered are listed, but only abnormal results are displayed) Labs Reviewed  CBC WITH DIFFERENTIAL/PLATELET - Abnormal; Notable for the following components:      Result Value   RBC 5.37 (*)    Hemoglobin 11.1 (*)    MCV 67.2 (*)    MCH 20.7 (*)    RDW 20.8 (*)    Platelets 436 (*)    All other components within normal limits  COMPREHENSIVE METABOLIC PANEL - Abnormal; Notable for the following components:   Sodium 133 (*)    Glucose, Bld 320 (*)    Albumin 3.1 (*)    AST 14 (*)  All other components within normal limits  URINALYSIS, ROUTINE W REFLEX MICROSCOPIC - Abnormal; Notable for the following components:   APPearance HAZY (*)    Glucose, UA >=500 (*)    Hgb urine dipstick SMALL (*)    Protein, ur 30 (*)    Bacteria, UA MANY (*)    All other components within normal limits  RAPID URINE DRUG SCREEN, HOSP PERFORMED - Abnormal; Notable for the following components:   Opiates POSITIVE (*)    Benzodiazepines POSITIVE (*)    Tetrahydrocannabinol POSITIVE (*)    All other components within normal limits   LIPASE, BLOOD                                                                                                                         EKG  EKG Interpretation  Date/Time:    Ventricular Rate:    PR Interval:    QRS Duration:   QT Interval:    QTC Calculation:   R Axis:     Text Interpretation:         Radiology No results found.  Pertinent labs & imaging results that were available during my care of the patient were reviewed by me and considered in my medical decision making (see MDM for details).  Medications Ordered in ED Medications  sodium chloride 0.9 % bolus 1,000 mL (0 mLs Intravenous Stopped 07/30/21 0327)    Followed by  0.9 %  sodium chloride infusion (0 mLs Intravenous Stopped 07/30/21 0620)  metoCLOPramide (REGLAN) injection 10 mg (10 mg Intravenous Given 07/30/21 0300)  diphenhydrAMINE (BENADRYL) injection 12.5 mg (12.5 mg Intravenous Given 07/30/21 0257)  alum & mag hydroxide-simeth (MAALOX/MYLANTA) 200-200-20 MG/5ML suspension 30 mL (30 mLs Oral Given 07/30/21 0254)    And  lidocaine (XYLOCAINE) 2 % viscous mouth solution 15 mL (15 mLs Oral Given 07/30/21 0254)  hyoscyamine (LEVSIN SL) SL tablet 0.25 mg (0.25 mg Sublingual Given 07/30/21 0255)                                                                                                                                     Procedures Procedures  (including critical care time)  Medical Decision Making / ED Course I have reviewed the nursing notes for this encounter and the patient's prior records (if available in EHR or on provided paperwork).  ANJELICIA KNABB was evaluated in Emergency Department on 07/30/2021  for the symptoms described in the history of present illness. She was evaluated in the context of the global COVID-19 pandemic, which necessitated consideration that the patient might be at risk for infection with the SARS-CoV-2 virus that causes COVID-19. Institutional protocols and algorithms that pertain to  the evaluation of patients at risk for COVID-19 are in a state of rapid change based on information released by regulatory bodies including the CDC and federal and state organizations. These policies and algorithms were followed during the patient's care in the ED.    Patient presents with generalized abdominal pain with associated nausea, vomiting, diarrhea.  Patient seen previously for the same and had reassuring work-up including negative CT scan. Reports that she is scheduled to follow-up with GI.  While interviewing the patient I examined and palpated her abdomen which was nontender. Patient is tachycardic.  Will obtain screening labs Provide symptomatic management and reassess Pertinent labs & imaging results that were available during my care of the patient were reviewed by me and considered in my medical decision making:  Labs grossly reassuring without leukocytosis or severe anemia.  No significant electrolyte derangements or renal sufficiency.  Patient with hyperglycemia without evidence of DKA.  No evidence of bili obstruction or pancreatitis. UA not convincing for urinary tract infection. Contaminated with epithelial cells. UDS is positive for opiates and benzos which patient reportedly takes.  Also positive for THC.  Given her history of diabetes considering gastroparesis. 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  Patient treated symptomatically and after reassessment was able to tolerate oral intake. Tachycardia improved.   Final Clinical Impression(s) / ED Diagnoses Final diagnoses:  Generalized abdominal pain  Nausea and vomiting in adult   The patient appears reasonably screened and/or stabilized for discharge and I doubt any other medical condition or other [facility] requiring further screening, evaluation, or treatment in the ED at this time prior to discharge. Safe for discharge with strict return precautions.  Disposition:  Discharge  Condition: Good  I have discussed the results, Dx and Tx plan with the patient/family who expressed understanding and agree(s) with the plan. Discharge instructions discussed at length. The patient/family was given strict return precautions who verbalized understanding of the instructions. No further questions at time of discharge.    ED Discharge Orders          Ordered    metoCLOPramide (REGLAN) 10 MG tablet  Every 6 hours        07/30/21 [id number]             Follow Up: Elwyn Reach, MD [address] [place] [address] [phone]  Call  to schedule an appointment for close follow up     This chart was dictated using voice recognition software.  Despite best efforts to proofread,  errors can occur which can change the documentation meaning.    Fatima Blank, MD 07/30/21 [phone]

## 2021-07-30 NOTE — ED Notes (Signed)
EDP explained tests results and plan of care to patient .

## 2021-07-31 DIAGNOSIS — G894 Chronic pain syndrome: Secondary | ICD-10-CM | POA: Diagnosis not present

## 2021-08-01 DIAGNOSIS — G894 Chronic pain syndrome: Secondary | ICD-10-CM | POA: Diagnosis not present

## 2021-08-02 DIAGNOSIS — G894 Chronic pain syndrome: Secondary | ICD-10-CM | POA: Diagnosis not present

## 2021-08-03 DIAGNOSIS — G894 Chronic pain syndrome: Secondary | ICD-10-CM | POA: Diagnosis not present

## 2021-08-04 DIAGNOSIS — G894 Chronic pain syndrome: Secondary | ICD-10-CM | POA: Diagnosis not present

## 2021-08-05 DIAGNOSIS — G894 Chronic pain syndrome: Secondary | ICD-10-CM | POA: Diagnosis not present

## 2021-08-06 DIAGNOSIS — G894 Chronic pain syndrome: Secondary | ICD-10-CM | POA: Diagnosis not present

## 2021-08-07 DIAGNOSIS — G894 Chronic pain syndrome: Secondary | ICD-10-CM | POA: Diagnosis not present

## 2021-08-08 DIAGNOSIS — G894 Chronic pain syndrome: Secondary | ICD-10-CM | POA: Diagnosis not present

## 2021-08-09 DIAGNOSIS — G894 Chronic pain syndrome: Secondary | ICD-10-CM | POA: Diagnosis not present

## 2021-08-10 DIAGNOSIS — G894 Chronic pain syndrome: Secondary | ICD-10-CM | POA: Diagnosis not present

## 2021-08-11 DIAGNOSIS — G894 Chronic pain syndrome: Secondary | ICD-10-CM | POA: Diagnosis not present

## 2021-08-12 ENCOUNTER — Other Ambulatory Visit: Payer: Self-pay | Admitting: *Deleted

## 2021-08-12 DIAGNOSIS — G894 Chronic pain syndrome: Secondary | ICD-10-CM | POA: Diagnosis not present

## 2021-08-12 NOTE — Patient Outreach (Signed)
[facility]) Care Management  08/12/2021  Cynthia Rosales May 13, 1981 [id number]   Outgoing call placed to member, state she is doing well.  Denies any urgent concerns, encouraged to contact this care manager with questions.  Agrees to ongoing follow up from health coach for disease management.  Will place referral.   Goals Addressed             This Visit's Progress    [facility] - Lifestyle Change-Hypertension   On track    Timeframe:  Long-Range Goal Priority:  Medium Start Date:            6/23                 Expected End Date:    9/23                   Follow Up Date 06/11/2021   Barriers: Knowledge    - ask questions to understand - have a family meeting to talk about healthy habits - learn about high blood pressure    Why is this important?   The changes that you are asked to make may be hard to do.  This is especially true when the changes are life-long.  Knowing why it is important to you is the first step.  Working on the change with your family or support person helps you not feel alone.  Reward yourself and family or support person when goals are met. This can be an activity you choose like bowling, hiking, biking, swimming or shooting hoops.     Notes:   6/23 - Discussed increasing activity in effort to help better manage HTN.  Will call insurance to inquire about gym membership.  This RNCM called PCP office to request new order for outpatient PT in effort to improve strength  7/13 - Report she is still working on her lifestyle changes, advised again to call insurance company regarding gym/wellness benefits.  Will call PCP office to schedule follow up appointment in effort to restart PT sessions.  9/13 - Has followed up with PCP, has not restarted PT sessions.  She has had some GI concerns, PCP office placed referral to GI, awaiting call for appointment     COMPLETED: [facility] - Track and Manage My Blood Pressure-Hypertension   On track    Timeframe:   Short-Term Goal Priority:  High Start Date:       6/23                      Expected End Date:           7/23  Barriers: Health Behaviors             Follow Up Date 06/11/2021    - check blood pressure 3 times per week - write blood pressure results in a log or diary    Why is this important?   You won't feel high blood pressure, but it can still hurt your blood vessels.  High blood pressure can cause heart or kidney problems. It can also cause a stroke.  Making lifestyle changes like losing a little weight or eating less salt will help.  Checking your blood pressure at home and at different times of the day can help to control blood pressure.  If the doctor prescribes medicine remember to take it the way the doctor ordered.  Call the office if you cannot afford the medicine or if there are questions about  it.     Notes:   6/23 - Encouraged to start BP log and share trends with PCP.  Will send [facility] calendar with logs  7/13 - Confirms she received HTN education and logs in the mail, will review and start daily monitoring  9/13 - Member report she has been monitoring blood pressures several times a week, does not provide specific numbers but state they are "good."         Valente David, Therapist, sports, MSN [place] Cynthia Rosales Manager [phone]

## 2021-08-13 DIAGNOSIS — G894 Chronic pain syndrome: Secondary | ICD-10-CM | POA: Diagnosis not present

## 2021-08-14 DIAGNOSIS — G894 Chronic pain syndrome: Secondary | ICD-10-CM | POA: Diagnosis not present

## 2021-08-15 DIAGNOSIS — G894 Chronic pain syndrome: Secondary | ICD-10-CM | POA: Diagnosis not present

## 2021-08-16 DIAGNOSIS — G894 Chronic pain syndrome: Secondary | ICD-10-CM | POA: Diagnosis not present

## 2021-08-17 DIAGNOSIS — G894 Chronic pain syndrome: Secondary | ICD-10-CM | POA: Diagnosis not present

## 2021-08-18 DIAGNOSIS — G894 Chronic pain syndrome: Secondary | ICD-10-CM | POA: Diagnosis not present

## 2021-08-19 DIAGNOSIS — G894 Chronic pain syndrome: Secondary | ICD-10-CM | POA: Diagnosis not present

## 2021-08-20 DIAGNOSIS — G894 Chronic pain syndrome: Secondary | ICD-10-CM | POA: Diagnosis not present

## 2021-08-21 DIAGNOSIS — G894 Chronic pain syndrome: Secondary | ICD-10-CM | POA: Diagnosis not present

## 2021-08-22 DIAGNOSIS — G894 Chronic pain syndrome: Secondary | ICD-10-CM | POA: Diagnosis not present

## 2021-08-23 DIAGNOSIS — G894 Chronic pain syndrome: Secondary | ICD-10-CM | POA: Diagnosis not present

## 2021-08-24 DIAGNOSIS — G894 Chronic pain syndrome: Secondary | ICD-10-CM | POA: Diagnosis not present

## 2021-08-25 DIAGNOSIS — G894 Chronic pain syndrome: Secondary | ICD-10-CM | POA: Diagnosis not present

## 2021-08-26 DIAGNOSIS — G894 Chronic pain syndrome: Secondary | ICD-10-CM | POA: Diagnosis not present

## 2021-08-27 DIAGNOSIS — G894 Chronic pain syndrome: Secondary | ICD-10-CM | POA: Diagnosis not present

## 2021-08-28 DIAGNOSIS — G894 Chronic pain syndrome: Secondary | ICD-10-CM | POA: Diagnosis not present

## 2021-08-29 DIAGNOSIS — G894 Chronic pain syndrome: Secondary | ICD-10-CM | POA: Diagnosis not present

## 2021-08-31 DIAGNOSIS — G894 Chronic pain syndrome: Secondary | ICD-10-CM | POA: Diagnosis not present

## 2021-09-01 DIAGNOSIS — G894 Chronic pain syndrome: Secondary | ICD-10-CM | POA: Diagnosis not present

## 2021-09-02 DIAGNOSIS — G894 Chronic pain syndrome: Secondary | ICD-10-CM | POA: Diagnosis not present

## 2021-09-03 DIAGNOSIS — G894 Chronic pain syndrome: Secondary | ICD-10-CM | POA: Diagnosis not present

## 2021-09-04 DIAGNOSIS — G894 Chronic pain syndrome: Secondary | ICD-10-CM | POA: Diagnosis not present

## 2021-09-05 ENCOUNTER — Other Ambulatory Visit: Payer: Self-pay | Admitting: *Deleted

## 2021-09-05 DIAGNOSIS — G894 Chronic pain syndrome: Secondary | ICD-10-CM | POA: Diagnosis not present

## 2021-09-06 DIAGNOSIS — G894 Chronic pain syndrome: Secondary | ICD-10-CM | POA: Diagnosis not present

## 2021-09-07 DIAGNOSIS — G894 Chronic pain syndrome: Secondary | ICD-10-CM | POA: Diagnosis not present

## 2021-09-08 DIAGNOSIS — G894 Chronic pain syndrome: Secondary | ICD-10-CM | POA: Diagnosis not present

## 2021-09-09 DIAGNOSIS — G894 Chronic pain syndrome: Secondary | ICD-10-CM | POA: Diagnosis not present

## 2021-09-10 ENCOUNTER — Other Ambulatory Visit: Payer: Self-pay | Admitting: *Deleted

## 2021-09-10 DIAGNOSIS — G894 Chronic pain syndrome: Secondary | ICD-10-CM | POA: Diagnosis not present

## 2021-09-10 NOTE — Patient Outreach (Addendum)
[place] [facility]) Care Management  09/10/2021  Cynthia Rosales Jan 21, 1981 [id number]  RN Health Coach telephone call to patient.  Hipaa compliance verified. Per patient she was not feeling good. Patient requested a call back at a later date.  Plan: RN will call back at a later date.  [place] Management [phone]  .

## 2021-09-11 DIAGNOSIS — G894 Chronic pain syndrome: Secondary | ICD-10-CM | POA: Diagnosis not present

## 2021-09-12 DIAGNOSIS — G894 Chronic pain syndrome: Secondary | ICD-10-CM | POA: Diagnosis not present

## 2021-09-13 DIAGNOSIS — G894 Chronic pain syndrome: Secondary | ICD-10-CM | POA: Diagnosis not present

## 2021-09-14 DIAGNOSIS — G894 Chronic pain syndrome: Secondary | ICD-10-CM | POA: Diagnosis not present

## 2021-09-15 DIAGNOSIS — G894 Chronic pain syndrome: Secondary | ICD-10-CM | POA: Diagnosis not present

## 2021-09-16 DIAGNOSIS — G894 Chronic pain syndrome: Secondary | ICD-10-CM | POA: Diagnosis not present

## 2021-09-17 DIAGNOSIS — G894 Chronic pain syndrome: Secondary | ICD-10-CM | POA: Diagnosis not present

## 2021-09-18 DIAGNOSIS — G894 Chronic pain syndrome: Secondary | ICD-10-CM | POA: Diagnosis not present

## 2021-09-19 DIAGNOSIS — G894 Chronic pain syndrome: Secondary | ICD-10-CM | POA: Diagnosis not present

## 2021-09-20 DIAGNOSIS — G894 Chronic pain syndrome: Secondary | ICD-10-CM | POA: Diagnosis not present

## 2021-09-21 DIAGNOSIS — G894 Chronic pain syndrome: Secondary | ICD-10-CM | POA: Diagnosis not present

## 2021-09-22 DIAGNOSIS — G894 Chronic pain syndrome: Secondary | ICD-10-CM | POA: Diagnosis not present

## 2021-09-23 DIAGNOSIS — G894 Chronic pain syndrome: Secondary | ICD-10-CM | POA: Diagnosis not present

## 2021-09-24 DIAGNOSIS — G894 Chronic pain syndrome: Secondary | ICD-10-CM | POA: Diagnosis not present

## 2021-09-25 DIAGNOSIS — G894 Chronic pain syndrome: Secondary | ICD-10-CM | POA: Diagnosis not present

## 2021-09-26 DIAGNOSIS — G894 Chronic pain syndrome: Secondary | ICD-10-CM | POA: Diagnosis not present

## 2021-09-27 DIAGNOSIS — G894 Chronic pain syndrome: Secondary | ICD-10-CM | POA: Diagnosis not present

## 2021-09-28 DIAGNOSIS — G894 Chronic pain syndrome: Secondary | ICD-10-CM | POA: Diagnosis not present

## 2021-09-29 DIAGNOSIS — G894 Chronic pain syndrome: Secondary | ICD-10-CM | POA: Diagnosis not present

## 2021-09-30 DIAGNOSIS — G894 Chronic pain syndrome: Secondary | ICD-10-CM | POA: Diagnosis not present

## 2021-10-01 DIAGNOSIS — G894 Chronic pain syndrome: Secondary | ICD-10-CM | POA: Diagnosis not present

## 2021-10-02 DIAGNOSIS — G894 Chronic pain syndrome: Secondary | ICD-10-CM | POA: Diagnosis not present

## 2021-10-03 DIAGNOSIS — G894 Chronic pain syndrome: Secondary | ICD-10-CM | POA: Diagnosis not present

## 2021-10-04 DIAGNOSIS — G894 Chronic pain syndrome: Secondary | ICD-10-CM | POA: Diagnosis not present

## 2021-10-05 DIAGNOSIS — G894 Chronic pain syndrome: Secondary | ICD-10-CM | POA: Diagnosis not present

## 2021-10-06 DIAGNOSIS — G894 Chronic pain syndrome: Secondary | ICD-10-CM | POA: Diagnosis not present

## 2021-10-07 DIAGNOSIS — G894 Chronic pain syndrome: Secondary | ICD-10-CM | POA: Diagnosis not present

## 2021-10-08 DIAGNOSIS — E119 Type 2 diabetes mellitus without complications: Secondary | ICD-10-CM | POA: Diagnosis not present

## 2021-10-08 DIAGNOSIS — G894 Chronic pain syndrome: Secondary | ICD-10-CM | POA: Diagnosis not present

## 2021-10-09 DIAGNOSIS — G894 Chronic pain syndrome: Secondary | ICD-10-CM | POA: Diagnosis not present

## 2021-10-10 DIAGNOSIS — G894 Chronic pain syndrome: Secondary | ICD-10-CM | POA: Diagnosis not present

## 2021-10-11 DIAGNOSIS — G894 Chronic pain syndrome: Secondary | ICD-10-CM | POA: Diagnosis not present

## 2021-10-12 DIAGNOSIS — G894 Chronic pain syndrome: Secondary | ICD-10-CM | POA: Diagnosis not present

## 2021-10-13 ENCOUNTER — Other Ambulatory Visit: Payer: Self-pay | Admitting: *Deleted

## 2021-10-13 NOTE — Patient Outreach (Signed)
[place] [facility]) Care Management  10/13/2021  Cynthia Rosales Jul 30, 1981 [id number]   RN Health Coach attempted follow up outreach call to patient.  Patient was unavailable. HIPPA compliance voicemail message left with return callback number.  Plan: RN will call patient again within 30 days.  [place] Care Management [phone]

## 2021-10-29 DIAGNOSIS — E1140 Type 2 diabetes mellitus with diabetic neuropathy, unspecified: Secondary | ICD-10-CM | POA: Diagnosis not present

## 2021-10-29 DIAGNOSIS — I10 Essential (primary) hypertension: Secondary | ICD-10-CM | POA: Diagnosis not present

## 2021-10-29 DIAGNOSIS — E119 Type 2 diabetes mellitus without complications: Secondary | ICD-10-CM | POA: Diagnosis not present

## 2021-10-30 DIAGNOSIS — E119 Type 2 diabetes mellitus without complications: Secondary | ICD-10-CM | POA: Diagnosis not present

## 2021-10-30 DIAGNOSIS — I10 Essential (primary) hypertension: Secondary | ICD-10-CM | POA: Diagnosis not present

## 2021-10-30 DIAGNOSIS — E1140 Type 2 diabetes mellitus with diabetic neuropathy, unspecified: Secondary | ICD-10-CM | POA: Diagnosis not present

## 2021-10-31 DIAGNOSIS — E1140 Type 2 diabetes mellitus with diabetic neuropathy, unspecified: Secondary | ICD-10-CM | POA: Diagnosis not present

## 2021-10-31 DIAGNOSIS — I10 Essential (primary) hypertension: Secondary | ICD-10-CM | POA: Diagnosis not present

## 2021-10-31 DIAGNOSIS — E119 Type 2 diabetes mellitus without complications: Secondary | ICD-10-CM | POA: Diagnosis not present

## 2021-11-01 DIAGNOSIS — E119 Type 2 diabetes mellitus without complications: Secondary | ICD-10-CM | POA: Diagnosis not present

## 2021-11-01 DIAGNOSIS — E1140 Type 2 diabetes mellitus with diabetic neuropathy, unspecified: Secondary | ICD-10-CM | POA: Diagnosis not present

## 2021-11-01 DIAGNOSIS — I10 Essential (primary) hypertension: Secondary | ICD-10-CM | POA: Diagnosis not present

## 2021-11-02 DIAGNOSIS — E1140 Type 2 diabetes mellitus with diabetic neuropathy, unspecified: Secondary | ICD-10-CM | POA: Diagnosis not present

## 2021-11-02 DIAGNOSIS — I10 Essential (primary) hypertension: Secondary | ICD-10-CM | POA: Diagnosis not present

## 2021-11-02 DIAGNOSIS — E119 Type 2 diabetes mellitus without complications: Secondary | ICD-10-CM | POA: Diagnosis not present

## 2021-11-03 DIAGNOSIS — E119 Type 2 diabetes mellitus without complications: Secondary | ICD-10-CM | POA: Diagnosis not present

## 2021-11-03 DIAGNOSIS — I10 Essential (primary) hypertension: Secondary | ICD-10-CM | POA: Diagnosis not present

## 2021-11-03 DIAGNOSIS — E1140 Type 2 diabetes mellitus with diabetic neuropathy, unspecified: Secondary | ICD-10-CM | POA: Diagnosis not present

## 2021-11-04 DIAGNOSIS — E1140 Type 2 diabetes mellitus with diabetic neuropathy, unspecified: Secondary | ICD-10-CM | POA: Diagnosis not present

## 2021-11-04 DIAGNOSIS — I10 Essential (primary) hypertension: Secondary | ICD-10-CM | POA: Diagnosis not present

## 2021-11-04 DIAGNOSIS — E119 Type 2 diabetes mellitus without complications: Secondary | ICD-10-CM | POA: Diagnosis not present

## 2021-11-05 DIAGNOSIS — E1140 Type 2 diabetes mellitus with diabetic neuropathy, unspecified: Secondary | ICD-10-CM | POA: Diagnosis not present

## 2021-11-05 DIAGNOSIS — I10 Essential (primary) hypertension: Secondary | ICD-10-CM | POA: Diagnosis not present

## 2021-11-05 DIAGNOSIS — E119 Type 2 diabetes mellitus without complications: Secondary | ICD-10-CM | POA: Diagnosis not present

## 2021-11-06 ENCOUNTER — Other Ambulatory Visit: Payer: Self-pay | Admitting: *Deleted

## 2021-11-06 DIAGNOSIS — E1140 Type 2 diabetes mellitus with diabetic neuropathy, unspecified: Secondary | ICD-10-CM | POA: Diagnosis not present

## 2021-11-06 DIAGNOSIS — I10 Essential (primary) hypertension: Secondary | ICD-10-CM | POA: Diagnosis not present

## 2021-11-06 DIAGNOSIS — E119 Type 2 diabetes mellitus without complications: Secondary | ICD-10-CM | POA: Diagnosis not present

## 2021-11-06 NOTE — Patient Outreach (Addendum)
[facility]) Care Management  11/06/2021  Cynthia Rosales 1981-11-17 [id number]   RN Health Coach telephone call to patient.  Hipaa compliance verified. Per patient this is not a good time to speak with RN Health Coach.. Patient requested that a call back be made at a later date.   Plan: RN will follow up within 30 days  [place] Management [phone]

## 2021-11-07 DIAGNOSIS — E119 Type 2 diabetes mellitus without complications: Secondary | ICD-10-CM | POA: Diagnosis not present

## 2021-11-07 DIAGNOSIS — E1140 Type 2 diabetes mellitus with diabetic neuropathy, unspecified: Secondary | ICD-10-CM | POA: Diagnosis not present

## 2021-11-07 DIAGNOSIS — I10 Essential (primary) hypertension: Secondary | ICD-10-CM | POA: Diagnosis not present

## 2021-11-08 DIAGNOSIS — E119 Type 2 diabetes mellitus without complications: Secondary | ICD-10-CM | POA: Diagnosis not present

## 2021-11-08 DIAGNOSIS — I10 Essential (primary) hypertension: Secondary | ICD-10-CM | POA: Diagnosis not present

## 2021-11-08 DIAGNOSIS — E1140 Type 2 diabetes mellitus with diabetic neuropathy, unspecified: Secondary | ICD-10-CM | POA: Diagnosis not present

## 2021-11-09 DIAGNOSIS — I10 Essential (primary) hypertension: Secondary | ICD-10-CM | POA: Diagnosis not present

## 2021-11-09 DIAGNOSIS — E119 Type 2 diabetes mellitus without complications: Secondary | ICD-10-CM | POA: Diagnosis not present

## 2021-11-09 DIAGNOSIS — E1140 Type 2 diabetes mellitus with diabetic neuropathy, unspecified: Secondary | ICD-10-CM | POA: Diagnosis not present

## 2021-11-10 DIAGNOSIS — I10 Essential (primary) hypertension: Secondary | ICD-10-CM | POA: Diagnosis not present

## 2021-11-10 DIAGNOSIS — E1140 Type 2 diabetes mellitus with diabetic neuropathy, unspecified: Secondary | ICD-10-CM | POA: Diagnosis not present

## 2021-11-10 DIAGNOSIS — E119 Type 2 diabetes mellitus without complications: Secondary | ICD-10-CM | POA: Diagnosis not present

## 2021-11-11 DIAGNOSIS — E1140 Type 2 diabetes mellitus with diabetic neuropathy, unspecified: Secondary | ICD-10-CM | POA: Diagnosis not present

## 2021-11-11 DIAGNOSIS — E119 Type 2 diabetes mellitus without complications: Secondary | ICD-10-CM | POA: Diagnosis not present

## 2021-11-11 DIAGNOSIS — I10 Essential (primary) hypertension: Secondary | ICD-10-CM | POA: Diagnosis not present

## 2021-11-12 DIAGNOSIS — E119 Type 2 diabetes mellitus without complications: Secondary | ICD-10-CM | POA: Diagnosis not present

## 2021-11-12 DIAGNOSIS — E1140 Type 2 diabetes mellitus with diabetic neuropathy, unspecified: Secondary | ICD-10-CM | POA: Diagnosis not present

## 2021-11-12 DIAGNOSIS — I10 Essential (primary) hypertension: Secondary | ICD-10-CM | POA: Diagnosis not present

## 2021-11-13 DIAGNOSIS — E119 Type 2 diabetes mellitus without complications: Secondary | ICD-10-CM | POA: Diagnosis not present

## 2021-11-13 DIAGNOSIS — E1140 Type 2 diabetes mellitus with diabetic neuropathy, unspecified: Secondary | ICD-10-CM | POA: Diagnosis not present

## 2021-11-13 DIAGNOSIS — I10 Essential (primary) hypertension: Secondary | ICD-10-CM | POA: Diagnosis not present

## 2021-11-14 DIAGNOSIS — E119 Type 2 diabetes mellitus without complications: Secondary | ICD-10-CM | POA: Diagnosis not present

## 2021-11-14 DIAGNOSIS — E1140 Type 2 diabetes mellitus with diabetic neuropathy, unspecified: Secondary | ICD-10-CM | POA: Diagnosis not present

## 2021-11-14 DIAGNOSIS — I10 Essential (primary) hypertension: Secondary | ICD-10-CM | POA: Diagnosis not present

## 2021-11-15 DIAGNOSIS — E1140 Type 2 diabetes mellitus with diabetic neuropathy, unspecified: Secondary | ICD-10-CM | POA: Diagnosis not present

## 2021-11-15 DIAGNOSIS — E119 Type 2 diabetes mellitus without complications: Secondary | ICD-10-CM | POA: Diagnosis not present

## 2021-11-15 DIAGNOSIS — I10 Essential (primary) hypertension: Secondary | ICD-10-CM | POA: Diagnosis not present

## 2021-11-16 DIAGNOSIS — E1140 Type 2 diabetes mellitus with diabetic neuropathy, unspecified: Secondary | ICD-10-CM | POA: Diagnosis not present

## 2021-11-16 DIAGNOSIS — E119 Type 2 diabetes mellitus without complications: Secondary | ICD-10-CM | POA: Diagnosis not present

## 2021-11-16 DIAGNOSIS — I10 Essential (primary) hypertension: Secondary | ICD-10-CM | POA: Diagnosis not present

## 2021-11-17 DIAGNOSIS — E119 Type 2 diabetes mellitus without complications: Secondary | ICD-10-CM | POA: Diagnosis not present

## 2021-11-17 DIAGNOSIS — I10 Essential (primary) hypertension: Secondary | ICD-10-CM | POA: Diagnosis not present

## 2021-11-17 DIAGNOSIS — E1140 Type 2 diabetes mellitus with diabetic neuropathy, unspecified: Secondary | ICD-10-CM | POA: Diagnosis not present

## 2021-11-18 DIAGNOSIS — I10 Essential (primary) hypertension: Secondary | ICD-10-CM | POA: Diagnosis not present

## 2021-11-18 DIAGNOSIS — E1140 Type 2 diabetes mellitus with diabetic neuropathy, unspecified: Secondary | ICD-10-CM | POA: Diagnosis not present

## 2021-11-18 DIAGNOSIS — E119 Type 2 diabetes mellitus without complications: Secondary | ICD-10-CM | POA: Diagnosis not present

## 2021-11-19 DIAGNOSIS — E1140 Type 2 diabetes mellitus with diabetic neuropathy, unspecified: Secondary | ICD-10-CM | POA: Diagnosis not present

## 2021-11-19 DIAGNOSIS — I10 Essential (primary) hypertension: Secondary | ICD-10-CM | POA: Diagnosis not present

## 2021-11-19 DIAGNOSIS — E119 Type 2 diabetes mellitus without complications: Secondary | ICD-10-CM | POA: Diagnosis not present

## 2021-11-20 DIAGNOSIS — I10 Essential (primary) hypertension: Secondary | ICD-10-CM | POA: Diagnosis not present

## 2021-11-20 DIAGNOSIS — E1140 Type 2 diabetes mellitus with diabetic neuropathy, unspecified: Secondary | ICD-10-CM | POA: Diagnosis not present

## 2021-11-20 DIAGNOSIS — E119 Type 2 diabetes mellitus without complications: Secondary | ICD-10-CM | POA: Diagnosis not present

## 2021-11-21 DIAGNOSIS — E119 Type 2 diabetes mellitus without complications: Secondary | ICD-10-CM | POA: Diagnosis not present

## 2021-11-21 DIAGNOSIS — I10 Essential (primary) hypertension: Secondary | ICD-10-CM | POA: Diagnosis not present

## 2021-11-21 DIAGNOSIS — E1140 Type 2 diabetes mellitus with diabetic neuropathy, unspecified: Secondary | ICD-10-CM | POA: Diagnosis not present

## 2021-11-22 DIAGNOSIS — E119 Type 2 diabetes mellitus without complications: Secondary | ICD-10-CM | POA: Diagnosis not present

## 2021-11-22 DIAGNOSIS — I10 Essential (primary) hypertension: Secondary | ICD-10-CM | POA: Diagnosis not present

## 2021-11-22 DIAGNOSIS — E1140 Type 2 diabetes mellitus with diabetic neuropathy, unspecified: Secondary | ICD-10-CM | POA: Diagnosis not present

## 2021-11-23 DIAGNOSIS — I10 Essential (primary) hypertension: Secondary | ICD-10-CM | POA: Diagnosis not present

## 2021-11-23 DIAGNOSIS — E1140 Type 2 diabetes mellitus with diabetic neuropathy, unspecified: Secondary | ICD-10-CM | POA: Diagnosis not present

## 2021-11-23 DIAGNOSIS — E119 Type 2 diabetes mellitus without complications: Secondary | ICD-10-CM | POA: Diagnosis not present

## 2021-11-24 DIAGNOSIS — E1140 Type 2 diabetes mellitus with diabetic neuropathy, unspecified: Secondary | ICD-10-CM | POA: Diagnosis not present

## 2021-11-24 DIAGNOSIS — E119 Type 2 diabetes mellitus without complications: Secondary | ICD-10-CM | POA: Diagnosis not present

## 2021-11-24 DIAGNOSIS — I10 Essential (primary) hypertension: Secondary | ICD-10-CM | POA: Diagnosis not present

## 2021-11-25 DIAGNOSIS — E1140 Type 2 diabetes mellitus with diabetic neuropathy, unspecified: Secondary | ICD-10-CM | POA: Diagnosis not present

## 2021-11-25 DIAGNOSIS — I10 Essential (primary) hypertension: Secondary | ICD-10-CM | POA: Diagnosis not present

## 2021-11-25 DIAGNOSIS — E119 Type 2 diabetes mellitus without complications: Secondary | ICD-10-CM | POA: Diagnosis not present

## 2021-11-26 DIAGNOSIS — E1140 Type 2 diabetes mellitus with diabetic neuropathy, unspecified: Secondary | ICD-10-CM | POA: Diagnosis not present

## 2021-11-26 DIAGNOSIS — E119 Type 2 diabetes mellitus without complications: Secondary | ICD-10-CM | POA: Diagnosis not present

## 2021-11-26 DIAGNOSIS — I10 Essential (primary) hypertension: Secondary | ICD-10-CM | POA: Diagnosis not present

## 2021-11-27 DIAGNOSIS — I10 Essential (primary) hypertension: Secondary | ICD-10-CM | POA: Diagnosis not present

## 2021-11-27 DIAGNOSIS — E1140 Type 2 diabetes mellitus with diabetic neuropathy, unspecified: Secondary | ICD-10-CM | POA: Diagnosis not present

## 2021-11-27 DIAGNOSIS — E119 Type 2 diabetes mellitus without complications: Secondary | ICD-10-CM | POA: Diagnosis not present

## 2021-11-28 DIAGNOSIS — I10 Essential (primary) hypertension: Secondary | ICD-10-CM | POA: Diagnosis not present

## 2021-11-28 DIAGNOSIS — E119 Type 2 diabetes mellitus without complications: Secondary | ICD-10-CM | POA: Diagnosis not present

## 2021-11-28 DIAGNOSIS — E1140 Type 2 diabetes mellitus with diabetic neuropathy, unspecified: Secondary | ICD-10-CM | POA: Diagnosis not present

## 2021-11-29 DIAGNOSIS — E1140 Type 2 diabetes mellitus with diabetic neuropathy, unspecified: Secondary | ICD-10-CM | POA: Diagnosis not present

## 2021-11-29 DIAGNOSIS — E119 Type 2 diabetes mellitus without complications: Secondary | ICD-10-CM | POA: Diagnosis not present

## 2021-11-29 DIAGNOSIS — I10 Essential (primary) hypertension: Secondary | ICD-10-CM | POA: Diagnosis not present

## 2021-11-30 DIAGNOSIS — E1140 Type 2 diabetes mellitus with diabetic neuropathy, unspecified: Secondary | ICD-10-CM | POA: Diagnosis not present

## 2021-11-30 DIAGNOSIS — I10 Essential (primary) hypertension: Secondary | ICD-10-CM | POA: Diagnosis not present

## 2021-11-30 DIAGNOSIS — E119 Type 2 diabetes mellitus without complications: Secondary | ICD-10-CM | POA: Diagnosis not present

## 2021-12-01 DIAGNOSIS — E119 Type 2 diabetes mellitus without complications: Secondary | ICD-10-CM | POA: Diagnosis not present

## 2021-12-01 DIAGNOSIS — E1140 Type 2 diabetes mellitus with diabetic neuropathy, unspecified: Secondary | ICD-10-CM | POA: Diagnosis not present

## 2021-12-01 DIAGNOSIS — I10 Essential (primary) hypertension: Secondary | ICD-10-CM | POA: Diagnosis not present

## 2021-12-02 DIAGNOSIS — E1140 Type 2 diabetes mellitus with diabetic neuropathy, unspecified: Secondary | ICD-10-CM | POA: Diagnosis not present

## 2021-12-02 DIAGNOSIS — E119 Type 2 diabetes mellitus without complications: Secondary | ICD-10-CM | POA: Diagnosis not present

## 2021-12-02 DIAGNOSIS — I10 Essential (primary) hypertension: Secondary | ICD-10-CM | POA: Diagnosis not present

## 2021-12-03 DIAGNOSIS — E1140 Type 2 diabetes mellitus with diabetic neuropathy, unspecified: Secondary | ICD-10-CM | POA: Diagnosis not present

## 2021-12-03 DIAGNOSIS — E119 Type 2 diabetes mellitus without complications: Secondary | ICD-10-CM | POA: Diagnosis not present

## 2021-12-03 DIAGNOSIS — I10 Essential (primary) hypertension: Secondary | ICD-10-CM | POA: Diagnosis not present

## 2021-12-04 DIAGNOSIS — I10 Essential (primary) hypertension: Secondary | ICD-10-CM | POA: Diagnosis not present

## 2021-12-04 DIAGNOSIS — E119 Type 2 diabetes mellitus without complications: Secondary | ICD-10-CM | POA: Diagnosis not present

## 2021-12-04 DIAGNOSIS — E1140 Type 2 diabetes mellitus with diabetic neuropathy, unspecified: Secondary | ICD-10-CM | POA: Diagnosis not present

## 2021-12-05 DIAGNOSIS — E1140 Type 2 diabetes mellitus with diabetic neuropathy, unspecified: Secondary | ICD-10-CM | POA: Diagnosis not present

## 2021-12-05 DIAGNOSIS — E119 Type 2 diabetes mellitus without complications: Secondary | ICD-10-CM | POA: Diagnosis not present

## 2021-12-05 DIAGNOSIS — I10 Essential (primary) hypertension: Secondary | ICD-10-CM | POA: Diagnosis not present

## 2021-12-06 DIAGNOSIS — E1140 Type 2 diabetes mellitus with diabetic neuropathy, unspecified: Secondary | ICD-10-CM | POA: Diagnosis not present

## 2021-12-06 DIAGNOSIS — E119 Type 2 diabetes mellitus without complications: Secondary | ICD-10-CM | POA: Diagnosis not present

## 2021-12-06 DIAGNOSIS — I10 Essential (primary) hypertension: Secondary | ICD-10-CM | POA: Diagnosis not present

## 2021-12-07 DIAGNOSIS — E1140 Type 2 diabetes mellitus with diabetic neuropathy, unspecified: Secondary | ICD-10-CM | POA: Diagnosis not present

## 2021-12-07 DIAGNOSIS — I10 Essential (primary) hypertension: Secondary | ICD-10-CM | POA: Diagnosis not present

## 2021-12-07 DIAGNOSIS — E119 Type 2 diabetes mellitus without complications: Secondary | ICD-10-CM | POA: Diagnosis not present

## 2021-12-08 DIAGNOSIS — I10 Essential (primary) hypertension: Secondary | ICD-10-CM | POA: Diagnosis not present

## 2021-12-08 DIAGNOSIS — E1140 Type 2 diabetes mellitus with diabetic neuropathy, unspecified: Secondary | ICD-10-CM | POA: Diagnosis not present

## 2021-12-08 DIAGNOSIS — E119 Type 2 diabetes mellitus without complications: Secondary | ICD-10-CM | POA: Diagnosis not present

## 2021-12-09 DIAGNOSIS — I10 Essential (primary) hypertension: Secondary | ICD-10-CM | POA: Diagnosis not present

## 2021-12-09 DIAGNOSIS — E1140 Type 2 diabetes mellitus with diabetic neuropathy, unspecified: Secondary | ICD-10-CM | POA: Diagnosis not present

## 2021-12-09 DIAGNOSIS — E119 Type 2 diabetes mellitus without complications: Secondary | ICD-10-CM | POA: Diagnosis not present

## 2021-12-10 DIAGNOSIS — E119 Type 2 diabetes mellitus without complications: Secondary | ICD-10-CM | POA: Diagnosis not present

## 2021-12-10 DIAGNOSIS — I10 Essential (primary) hypertension: Secondary | ICD-10-CM | POA: Diagnosis not present

## 2021-12-10 DIAGNOSIS — E1140 Type 2 diabetes mellitus with diabetic neuropathy, unspecified: Secondary | ICD-10-CM | POA: Diagnosis not present

## 2021-12-11 DIAGNOSIS — E1140 Type 2 diabetes mellitus with diabetic neuropathy, unspecified: Secondary | ICD-10-CM | POA: Diagnosis not present

## 2021-12-11 DIAGNOSIS — E119 Type 2 diabetes mellitus without complications: Secondary | ICD-10-CM | POA: Diagnosis not present

## 2021-12-11 DIAGNOSIS — I10 Essential (primary) hypertension: Secondary | ICD-10-CM | POA: Diagnosis not present

## 2021-12-12 DIAGNOSIS — I10 Essential (primary) hypertension: Secondary | ICD-10-CM | POA: Diagnosis not present

## 2021-12-12 DIAGNOSIS — E1140 Type 2 diabetes mellitus with diabetic neuropathy, unspecified: Secondary | ICD-10-CM | POA: Diagnosis not present

## 2021-12-12 DIAGNOSIS — E119 Type 2 diabetes mellitus without complications: Secondary | ICD-10-CM | POA: Diagnosis not present

## 2021-12-13 DIAGNOSIS — I10 Essential (primary) hypertension: Secondary | ICD-10-CM | POA: Diagnosis not present

## 2021-12-13 DIAGNOSIS — E119 Type 2 diabetes mellitus without complications: Secondary | ICD-10-CM | POA: Diagnosis not present

## 2021-12-13 DIAGNOSIS — E1140 Type 2 diabetes mellitus with diabetic neuropathy, unspecified: Secondary | ICD-10-CM | POA: Diagnosis not present

## 2021-12-14 DIAGNOSIS — I10 Essential (primary) hypertension: Secondary | ICD-10-CM | POA: Diagnosis not present

## 2021-12-14 DIAGNOSIS — E1140 Type 2 diabetes mellitus with diabetic neuropathy, unspecified: Secondary | ICD-10-CM | POA: Diagnosis not present

## 2021-12-14 DIAGNOSIS — E119 Type 2 diabetes mellitus without complications: Secondary | ICD-10-CM | POA: Diagnosis not present

## 2021-12-15 DIAGNOSIS — E119 Type 2 diabetes mellitus without complications: Secondary | ICD-10-CM | POA: Diagnosis not present

## 2021-12-15 DIAGNOSIS — I10 Essential (primary) hypertension: Secondary | ICD-10-CM | POA: Diagnosis not present

## 2021-12-15 DIAGNOSIS — E1140 Type 2 diabetes mellitus with diabetic neuropathy, unspecified: Secondary | ICD-10-CM | POA: Diagnosis not present

## 2021-12-16 DIAGNOSIS — I10 Essential (primary) hypertension: Secondary | ICD-10-CM | POA: Diagnosis not present

## 2021-12-16 DIAGNOSIS — E1140 Type 2 diabetes mellitus with diabetic neuropathy, unspecified: Secondary | ICD-10-CM | POA: Diagnosis not present

## 2021-12-16 DIAGNOSIS — E119 Type 2 diabetes mellitus without complications: Secondary | ICD-10-CM | POA: Diagnosis not present

## 2021-12-17 DIAGNOSIS — E119 Type 2 diabetes mellitus without complications: Secondary | ICD-10-CM | POA: Diagnosis not present

## 2021-12-17 DIAGNOSIS — I10 Essential (primary) hypertension: Secondary | ICD-10-CM | POA: Diagnosis not present

## 2021-12-17 DIAGNOSIS — E1140 Type 2 diabetes mellitus with diabetic neuropathy, unspecified: Secondary | ICD-10-CM | POA: Diagnosis not present

## 2021-12-18 DIAGNOSIS — I10 Essential (primary) hypertension: Secondary | ICD-10-CM | POA: Diagnosis not present

## 2021-12-18 DIAGNOSIS — E1140 Type 2 diabetes mellitus with diabetic neuropathy, unspecified: Secondary | ICD-10-CM | POA: Diagnosis not present

## 2021-12-18 DIAGNOSIS — E119 Type 2 diabetes mellitus without complications: Secondary | ICD-10-CM | POA: Diagnosis not present

## 2021-12-19 DIAGNOSIS — I10 Essential (primary) hypertension: Secondary | ICD-10-CM | POA: Diagnosis not present

## 2021-12-19 DIAGNOSIS — E1140 Type 2 diabetes mellitus with diabetic neuropathy, unspecified: Secondary | ICD-10-CM | POA: Diagnosis not present

## 2021-12-19 DIAGNOSIS — E119 Type 2 diabetes mellitus without complications: Secondary | ICD-10-CM | POA: Diagnosis not present

## 2021-12-20 DIAGNOSIS — I10 Essential (primary) hypertension: Secondary | ICD-10-CM | POA: Diagnosis not present

## 2021-12-20 DIAGNOSIS — E119 Type 2 diabetes mellitus without complications: Secondary | ICD-10-CM | POA: Diagnosis not present

## 2021-12-20 DIAGNOSIS — E1140 Type 2 diabetes mellitus with diabetic neuropathy, unspecified: Secondary | ICD-10-CM | POA: Diagnosis not present

## 2021-12-21 DIAGNOSIS — I10 Essential (primary) hypertension: Secondary | ICD-10-CM | POA: Diagnosis not present

## 2021-12-21 DIAGNOSIS — E1140 Type 2 diabetes mellitus with diabetic neuropathy, unspecified: Secondary | ICD-10-CM | POA: Diagnosis not present

## 2021-12-21 DIAGNOSIS — E119 Type 2 diabetes mellitus without complications: Secondary | ICD-10-CM | POA: Diagnosis not present

## 2021-12-22 DIAGNOSIS — E119 Type 2 diabetes mellitus without complications: Secondary | ICD-10-CM | POA: Diagnosis not present

## 2021-12-22 DIAGNOSIS — E1140 Type 2 diabetes mellitus with diabetic neuropathy, unspecified: Secondary | ICD-10-CM | POA: Diagnosis not present

## 2021-12-22 DIAGNOSIS — I10 Essential (primary) hypertension: Secondary | ICD-10-CM | POA: Diagnosis not present

## 2021-12-23 DIAGNOSIS — I10 Essential (primary) hypertension: Secondary | ICD-10-CM | POA: Diagnosis not present

## 2021-12-23 DIAGNOSIS — E1140 Type 2 diabetes mellitus with diabetic neuropathy, unspecified: Secondary | ICD-10-CM | POA: Diagnosis not present

## 2021-12-23 DIAGNOSIS — E119 Type 2 diabetes mellitus without complications: Secondary | ICD-10-CM | POA: Diagnosis not present

## 2021-12-24 ENCOUNTER — Other Ambulatory Visit: Payer: Self-pay | Admitting: *Deleted

## 2021-12-24 DIAGNOSIS — I10 Essential (primary) hypertension: Secondary | ICD-10-CM | POA: Diagnosis not present

## 2021-12-24 DIAGNOSIS — E1140 Type 2 diabetes mellitus with diabetic neuropathy, unspecified: Secondary | ICD-10-CM | POA: Diagnosis not present

## 2021-12-24 DIAGNOSIS — E119 Type 2 diabetes mellitus without complications: Secondary | ICD-10-CM | POA: Diagnosis not present

## 2021-12-24 NOTE — Patient Outreach (Addendum)
[place] [facility]) Care Management  12/24/2021  Cynthia Rosales Apr 02, 1981 [id number]   RN Health Coach attempted follow up outreach call to patient.  Patient was unavailable. HIPPA compliance voicemail message left with return callback number.  Plan: RN will call patient again within 30 days. Rn sent unsuccessful outreach letter  [place] Management [phone]

## 2021-12-25 DIAGNOSIS — E119 Type 2 diabetes mellitus without complications: Secondary | ICD-10-CM | POA: Diagnosis not present

## 2021-12-25 DIAGNOSIS — I10 Essential (primary) hypertension: Secondary | ICD-10-CM | POA: Diagnosis not present

## 2021-12-25 DIAGNOSIS — E1140 Type 2 diabetes mellitus with diabetic neuropathy, unspecified: Secondary | ICD-10-CM | POA: Diagnosis not present

## 2021-12-26 DIAGNOSIS — E119 Type 2 diabetes mellitus without complications: Secondary | ICD-10-CM | POA: Diagnosis not present

## 2021-12-26 DIAGNOSIS — I10 Essential (primary) hypertension: Secondary | ICD-10-CM | POA: Diagnosis not present

## 2021-12-26 DIAGNOSIS — E1140 Type 2 diabetes mellitus with diabetic neuropathy, unspecified: Secondary | ICD-10-CM | POA: Diagnosis not present

## 2021-12-27 DIAGNOSIS — E1140 Type 2 diabetes mellitus with diabetic neuropathy, unspecified: Secondary | ICD-10-CM | POA: Diagnosis not present

## 2021-12-27 DIAGNOSIS — I10 Essential (primary) hypertension: Secondary | ICD-10-CM | POA: Diagnosis not present

## 2021-12-27 DIAGNOSIS — E119 Type 2 diabetes mellitus without complications: Secondary | ICD-10-CM | POA: Diagnosis not present

## 2021-12-28 DIAGNOSIS — E119 Type 2 diabetes mellitus without complications: Secondary | ICD-10-CM | POA: Diagnosis not present

## 2021-12-28 DIAGNOSIS — I10 Essential (primary) hypertension: Secondary | ICD-10-CM | POA: Diagnosis not present

## 2021-12-28 DIAGNOSIS — E1140 Type 2 diabetes mellitus with diabetic neuropathy, unspecified: Secondary | ICD-10-CM | POA: Diagnosis not present

## 2021-12-29 DIAGNOSIS — E119 Type 2 diabetes mellitus without complications: Secondary | ICD-10-CM | POA: Diagnosis not present

## 2021-12-29 DIAGNOSIS — I10 Essential (primary) hypertension: Secondary | ICD-10-CM | POA: Diagnosis not present

## 2021-12-29 DIAGNOSIS — E1140 Type 2 diabetes mellitus with diabetic neuropathy, unspecified: Secondary | ICD-10-CM | POA: Diagnosis not present

## 2021-12-30 DIAGNOSIS — E119 Type 2 diabetes mellitus without complications: Secondary | ICD-10-CM | POA: Diagnosis not present

## 2021-12-30 DIAGNOSIS — I10 Essential (primary) hypertension: Secondary | ICD-10-CM | POA: Diagnosis not present

## 2021-12-30 DIAGNOSIS — E1140 Type 2 diabetes mellitus with diabetic neuropathy, unspecified: Secondary | ICD-10-CM | POA: Diagnosis not present

## 2022-01-09 ENCOUNTER — Emergency Department (HOSPITAL_COMMUNITY): Payer: Medicare Other

## 2022-01-09 ENCOUNTER — Encounter (HOSPITAL_COMMUNITY): Payer: Self-pay | Admitting: *Deleted

## 2022-01-09 ENCOUNTER — Emergency Department (HOSPITAL_COMMUNITY)
Admission: EM | Admit: 2022-01-09 | Discharge: 2022-01-09 | Disposition: A | Discharge status: Home / Self Care | Payer: Medicare Other | Attending: Emergency Medicine | Admitting: Emergency Medicine

## 2022-01-09 ENCOUNTER — Other Ambulatory Visit: Payer: Self-pay

## 2022-01-09 DIAGNOSIS — U071 COVID-19: Secondary | ICD-10-CM | POA: Insufficient documentation

## 2022-01-09 DIAGNOSIS — Z794 Long term (current) use of insulin: Secondary | ICD-10-CM | POA: Insufficient documentation

## 2022-01-09 DIAGNOSIS — R059 Cough, unspecified: Secondary | ICD-10-CM | POA: Diagnosis present

## 2022-01-09 DIAGNOSIS — Z20822 Contact with and (suspected) exposure to covid-19: Secondary | ICD-10-CM | POA: Insufficient documentation

## 2022-01-09 DIAGNOSIS — R0602 Shortness of breath: Secondary | ICD-10-CM | POA: Diagnosis not present

## 2022-01-09 DIAGNOSIS — D57219 Sickle-cell/Hb-C disease with crisis, unspecified: Secondary | ICD-10-CM | POA: Insufficient documentation

## 2022-01-09 DIAGNOSIS — R739 Hyperglycemia, unspecified: Secondary | ICD-10-CM | POA: Insufficient documentation

## 2022-01-09 DIAGNOSIS — D5700 Hb-SS disease with crisis, unspecified: Secondary | ICD-10-CM

## 2022-01-09 LAB — URINALYSIS, ROUTINE W REFLEX MICROSCOPIC
Bilirubin Urine: NEGATIVE
Glucose, UA: >=500 mg/dL — AB
Ketones, ur: 5 mg/dL — AB
Leukocytes,Ua: NEGATIVE
Nitrite: NEGATIVE
Protein, ur: 100 mg/dL — AB
Specific Gravity, Urine: 1.033 — ABNORMAL HIGH (ref 1.005–1.030)
pH: 5 (ref 5.0–8.0)

## 2022-01-09 LAB — COMPREHENSIVE METABOLIC PANEL
ALT: 24 U/L (ref 0–44)
AST: 19 U/L (ref 15–41)
Albumin: 3.2 g/dL — ABNORMAL LOW (ref 3.5–5.0)
Alkaline Phosphatase: 88 U/L (ref 38–126)
Anion gap: 6 (ref 5–15)
BUN: 14 mg/dL (ref 6–20)
CO2: 25 mmol/L (ref 22–32)
Calcium: 8.1 mg/dL — ABNORMAL LOW (ref 8.9–10.3)
Chloride: 102 mmol/L (ref 98–111)
Creatinine, Ser: 0.85 mg/dL (ref 0.44–1.00)
GFR, Estimated: >60 mL/min (ref >60)
Glucose, Bld: 315 mg/dL — ABNORMAL HIGH (ref 70–99)
Potassium: 2.9 mmol/L — ABNORMAL LOW (ref 3.5–5.1)
Sodium: 133 mmol/L — ABNORMAL LOW (ref 135–145)
Total Bilirubin: 0.1 mg/dL — ABNORMAL LOW (ref 0.3–1.2)
Total Protein: 7.8 g/dL (ref 6.5–8.1)

## 2022-01-09 LAB — BLOOD GAS, VENOUS
Acid-Base Excess: 3 mmol/L — ABNORMAL HIGH (ref 0.0–2.0)
Bicarbonate: 26.6 mmol/L (ref 20.0–28.0)
O2 Saturation: 81.3 %
Patient temperature: 98.6
pCO2, Ven: 38.8 mmHg — ABNORMAL LOW (ref 44.0–60.0)
pH, Ven: 7.45 — ABNORMAL HIGH (ref 7.250–7.430)
pO2, Ven: 47.1 mmHg — ABNORMAL HIGH (ref 32.0–45.0)

## 2022-01-09 LAB — CBC WITH DIFFERENTIAL/PLATELET
Abs Immature Granulocytes: 0.02 10*3/uL (ref 0.00–0.07)
Basophils Absolute: 0.1 10*3/uL (ref 0.0–0.1)
Basophils Relative: 1 %
Eosinophils Absolute: 0 10*3/uL (ref 0.0–0.5)
Eosinophils Relative: 0 %
HCT: 36.3 % (ref 36.0–46.0)
Hemoglobin: 11.4 g/dL — ABNORMAL LOW (ref 12.0–15.0)
Immature Granulocytes: 0 %
Lymphocytes Relative: 39 %
Lymphs Abs: 2.5 10*3/uL (ref 0.7–4.0)
MCH: 21.9 pg — ABNORMAL LOW (ref 26.0–34.0)
MCHC: 31.4 g/dL (ref 30.0–36.0)
MCV: 69.8 fL — ABNORMAL LOW (ref 80.0–100.0)
Monocytes Absolute: 0.7 10*3/uL (ref 0.1–1.0)
Monocytes Relative: 11 %
Neutro Abs: 3.1 10*3/uL (ref 1.7–7.7)
Neutrophils Relative %: 49 %
Platelets: 333 10*3/uL (ref 150–400)
RBC: 5.2 MIL/uL — ABNORMAL HIGH (ref 3.87–5.11)
RDW: 19.6 % — ABNORMAL HIGH (ref 11.5–15.5)
WBC: 6.3 10*3/uL (ref 4.0–10.5)
nRBC: 0 % (ref 0.0–0.2)

## 2022-01-09 LAB — I-STAT BETA HCG BLOOD, ED (MC, WL, AP ONLY): I-stat hCG, quantitative: <5 m[IU]/mL (ref <5)

## 2022-01-09 LAB — RETICULOCYTES
Immature Retic Fract: 13.4 % (ref 2.3–15.9)
RBC.: 5.23 MIL/uL — ABNORMAL HIGH (ref 3.87–5.11)
Retic Count, Absolute: 54.4 10*3/uL (ref 19.0–186.0)
Retic Ct Pct: 1 % (ref 0.4–3.1)

## 2022-01-09 LAB — CBG MONITORING, ED
Glucose-Capillary: 329 mg/dL — ABNORMAL HIGH (ref 70–99)
Glucose-Capillary: 336 mg/dL — ABNORMAL HIGH (ref 70–99)

## 2022-01-09 LAB — LIPASE, BLOOD: Lipase: 37 U/L (ref 11–51)

## 2022-01-09 LAB — RESP PANEL BY RT-PCR (FLU A&B, COVID) ARPGX2
Influenza A by PCR: NEGATIVE
Influenza B by PCR: NEGATIVE
SARS Coronavirus 2 by RT PCR: POSITIVE — AB

## 2022-01-09 LAB — TROPONIN I (HIGH SENSITIVITY)
Troponin I (High Sensitivity): 4 ng/L (ref <18)
Troponin I (High Sensitivity): 4 ng/L (ref <18)

## 2022-01-09 LAB — BRAIN NATRIURETIC PEPTIDE: B Natriuretic Peptide: 19.9 pg/mL (ref 0.0–100.0)

## 2022-01-09 MED ORDER — HYDROMORPHONE HCL 2 MG/ML IJ SOLN
2.0000 mg | Freq: Once | INTRAMUSCULAR | Status: AC
  Administered 2022-01-09: 2 mg via INTRAVENOUS
  Filled 2022-01-09: qty 1

## 2022-01-09 MED ORDER — DIPHENOXYLATE-ATROPINE 2.5-0.025 MG PO TABS: 1.0000 | ORAL_TABLET | Freq: Four times a day (QID) | ORAL | 0 refills | Status: DC | PRN

## 2022-01-09 MED ORDER — POTASSIUM CHLORIDE CRYS ER 20 MEQ PO TBCR
40.0000 meq | EXTENDED_RELEASE_TABLET | Freq: Once | ORAL | Status: AC
Start: 2022-01-09 — End: 2022-01-09
  Administered 2022-01-09: 40 meq via ORAL
  Filled 2022-01-09: qty 2

## 2022-01-09 MED ORDER — ONDANSETRON HCL 4 MG PO TABS: 4.0000 mg | ORAL_TABLET | Freq: Four times a day (QID) | ORAL | 0 refills | Status: DC | PRN

## 2022-01-09 MED ORDER — NIRMATRELVIR/RITONAVIR (PAXLOVID)TABLET
3.0000 | ORAL_TABLET | Freq: Two times a day (BID) | ORAL | Status: DC
  Filled 2022-01-09: qty 30

## 2022-01-09 MED ORDER — ONDANSETRON HCL 4 MG/2ML IJ SOLN
4.0000 mg | Freq: Once | INTRAMUSCULAR | Status: AC
  Administered 2022-01-09: 4 mg via INTRAVENOUS
  Filled 2022-01-09: qty 2

## 2022-01-09 MED ORDER — SODIUM CHLORIDE 0.9 % IV BOLUS
500.0000 mL | Freq: Once | INTRAVENOUS | Status: AC
  Administered 2022-01-09: 500 mL via INTRAVENOUS

## 2022-01-09 MED ORDER — BENZONATATE 200 MG PO CAPS: 200.0000 mg | ORAL_CAPSULE | Freq: Three times a day (TID) | ORAL | 0 refills | Status: DC | PRN

## 2022-01-09 MED ORDER — LIDOCAINE VISCOUS HCL 2 % MT SOLN: 15.0000 mL | OROMUCOSAL | 0 refills | Status: DC | PRN

## 2022-01-09 NOTE — ED Notes (Signed)
Pt. Given ice chips.

## 2022-01-09 NOTE — ED Notes (Signed)
[facility] called for patient transportation [phone]

## 2022-01-09 NOTE — ED Triage Notes (Signed)
Pt is here by ems from home. She called ems due to genreralized pain related to sickle cell which has been going on for 3 weeks.  Pt has been feeling flu like for a week and has not been feeling well enough to eat and has not been taking her insulin.  EMS found that her CBG was 401.  Pt reports generalized body pain including chest pain.  No IV access.

## 2022-01-09 NOTE — ED Provider Notes (Signed)
[facility] DEPT Provider Note   CSN: [id number] Arrival date & time: 01/09/22  0151     History  Chief Complaint  Patient presents with   Sickle Cell Pain Crisis   Hyperglycemia   Illness    Cynthia Rosales is a 41 y.o. female.  Patient presents to the emergency department with concerns over nausea, vomiting, diarrhea, cough and generalized body aches.  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  Symptoms ongoing for some time.  She reports that she initially had body aches that she thought was her sickle cell but in the last few days she has developed more flulike symptoms.  She has not been able to eat or take her medications because of the nausea and vomiting.      Home Medications Prior to Admission medications   Medication Sig Start Date End Date Taking? Authorizing Provider  benzonatate (TESSALON) 200 MG capsule Take 1 capsule (200 mg total) by mouth 3 (three) times daily as needed for cough. 01/09/22  Yes Jasmin Winberry, Gwenyth Allegra, MD  diphenoxylate-atropine (LOMOTIL) 2.5-0.025 MG tablet Take 1-2 tablets by mouth 4 (four) times daily as needed for diarrhea or loose stools. 01/09/22  Yes Lennart Gladish, Gwenyth Allegra, MD  lidocaine (XYLOCAINE) 2 % solution Use as directed 15 mLs in the mouth or throat as needed for mouth pain. 01/09/22  Yes Tykee Heideman, Gwenyth Allegra, MD  ondansetron (ZOFRAN) 4 MG tablet Take 1 tablet (4 mg total) by mouth every 6 (six) hours as needed for nausea or vomiting. 01/09/22  Yes Colburn Asper, Gwenyth Allegra, MD  diazepam (VALIUM) 5 MG tablet Take 5 mg by mouth 2 (two) times daily as needed for muscle spasms.    [provider]  hydrochlorothiazide (HYDRODIURIL) 25 MG tablet Take 25 mg by mouth daily.  04/27/18   [provider]  metoCLOPramide (REGLAN) 10 MG tablet Take 1 tablet (10 mg total) by mouth every 6 (six) hours. 07/30/21   Cardama, Grayce Sessions, MD  NOVOLOG MIX 70/30 FLEXPEN (70-30) 100 UNIT/ML  FlexPen Inject 36 Units into the skin in the morning, at noon, and at bedtime.  02/28/20   [provider]  ondansetron (ZOFRAN-ODT) 4 MG disintegrating tablet Take 1 tablet (4 mg total) by mouth every 8 (eight) hours as needed for nausea or vomiting. 07/10/21   Arnaldo Natal, MD  oxycodone (ROXICODONE) 30 MG immediate release tablet Take 1 tablet (30 mg total) by mouth every 6 (six) hours as needed for pain. Patient taking differently: Take 45 mg by mouth every 4 (four) hours as needed for pain. 06/01/20   Dorena Dew, FNP  sitaGLIPtin (JANUVIA) 100 MG tablet Take 100 mg by mouth daily.    [provider]      Allergies    Amoxicillin, Buprenorphine hcl, and Morphine and related    Review of Systems   Review of Systems  Constitutional:  Positive for fatigue.  HENT:  Positive for congestion.   Respiratory:  Positive for cough.   Gastrointestinal:  Positive for diarrhea, nausea and vomiting.  Musculoskeletal:  Positive for myalgias.   Physical Exam Updated Vital Signs BP 119/83    Pulse 91    Temp 98.1 F (36.7 C) (Oral)    Resp (!) 23    LMP 03/21/2020    SpO2 94%  Physical Exam Vitals and nursing note reviewed.  Constitutional:      General: She is not in acute distress.    Appearance: She  is well-developed.  HENT:     Head: Normocephalic and atraumatic.     Mouth/Throat:     Mouth: Mucous membranes are moist.  Eyes:     General: Vision grossly intact. Gaze aligned appropriately.     Extraocular Movements: Extraocular movements intact.     Conjunctiva/sclera: Conjunctivae normal.  Cardiovascular:     Rate and Rhythm: Normal rate and regular rhythm.     Pulses: Normal pulses.     Heart sounds: Normal heart sounds, S1 normal and S2 normal. No murmur heard.   No friction rub. No gallop.  Pulmonary:     Effort: Pulmonary effort is normal. No respiratory distress.     Breath sounds: Normal breath sounds.  Abdominal:     General: Bowel sounds are normal.      Palpations: Abdomen is soft.     Tenderness: There is no abdominal tenderness. There is no guarding or rebound.     Hernia: No hernia is present.  Musculoskeletal:        General: No swelling.     Cervical back: Full passive range of motion without pain, normal range of motion and neck supple. No spinous process tenderness or muscular tenderness. Normal range of motion.     Right lower leg: No edema.     Left lower leg: No edema.  Skin:    General: Skin is warm and dry.     Capillary Refill: Capillary refill takes less than 2 seconds.     Findings: No ecchymosis, erythema, rash or wound.  Neurological:     General: No focal deficit present.     Mental Status: She is alert and oriented to person, place, and time.     GCS: GCS eye subscore is 4. GCS verbal subscore is 5. GCS motor subscore is 6.     Cranial Nerves: Cranial nerves 2-12 are intact.     Sensory: Sensation is intact.     Motor: Motor function is intact.     Coordination: Coordination is intact.  Psychiatric:        Attention and Perception: Attention normal.        Mood and Affect: Mood normal.        Speech: Speech normal.        Behavior: Behavior normal.    ED Results / Procedures / Treatments   Labs (all labs ordered are listed, but only abnormal results are displayed) Labs Reviewed  RESP PANEL BY RT-PCR (FLU A&B, COVID) ARPGX2 - Abnormal; Notable for the following components:      Result Value   SARS Coronavirus 2 by RT PCR POSITIVE (*)    All other components within normal limits  CBC WITH DIFFERENTIAL/PLATELET - Abnormal; Notable for the following components:   RBC 5.20 (*)    Hemoglobin 11.4 (*)    MCV 69.8 (*)    MCH 21.9 (*)    RDW 19.6 (*)    All other components within normal limits  COMPREHENSIVE METABOLIC PANEL - Abnormal; Notable for the following components:   Sodium 133 (*)    Potassium 2.9 (*)    Glucose, Bld 315 (*)    Calcium 8.1 (*)    Albumin 3.2 (*)    Total Bilirubin 0.1 (*)    All  other components within normal limits  RETICULOCYTES - Abnormal; Notable for the following components:   RBC. 5.23 (*)    All other components within normal limits  BLOOD GAS, VENOUS - Abnormal; Notable for the following  components:   pH, Ven 7.450 (*)    pCO2, Ven 38.8 (*)    pO2, Ven 47.1 (*)    Acid-Base Excess 3.0 (*)    All other components within normal limits  CBG MONITORING, ED - Abnormal; Notable for the following components:   Glucose-Capillary 329 (*)    All other components within normal limits  CBG MONITORING, ED - Abnormal; Notable for the following components:   Glucose-Capillary 336 (*)    All other components within normal limits  BRAIN NATRIURETIC PEPTIDE  LIPASE, BLOOD  URINALYSIS, ROUTINE W REFLEX MICROSCOPIC  I-STAT BETA HCG BLOOD, ED ([facility], [facility], [facility] ONLY)  TYPE AND SCREEN  TROPONIN I (HIGH SENSITIVITY)  TROPONIN I (HIGH SENSITIVITY)    EKG None  Radiology DG Chest Port 1 View  Result Date: 01/09/2022 CLINICAL DATA:  Chest pain x 3 days, shortness of breath EXAM: PORTABLE CHEST 1 VIEW COMPARISON:  08/22/2018 FINDINGS: Lungs are clear.  No pleural effusion or pneumothorax. The heart is normal in size. IMPRESSION: No evidence of acute cardiopulmonary disease. Electronically Signed   By: Julian Hy M.D.   On: 01/09/2022 03:31    Procedures Procedures    Medications Ordered in ED Medications  nirmatrelvir/ritonavir EUA (PAXLOVID) 3 tablet (has no administration in time range)  sodium chloride 0.9 % bolus 500 mL (0 mLs Intravenous Stopped 01/09/22 0523)  ondansetron (ZOFRAN) injection 4 mg (4 mg Intravenous Given 01/09/22 0348)  HYDROmorphone (DILAUDID) injection 2 mg (2 mg Intravenous Given 01/09/22 0348)  potassium chloride SA (KLOR-CON M) CR tablet 40 mEq (40 mEq Oral Given 01/09/22 0607)  HYDROmorphone (DILAUDID) injection 2 mg (2 mg Intravenous Given 01/09/22 0606)    ED Course/ Medical Decision Making/ A&P                           Medical  Decision Making Amount and/or Complexity of Data Reviewed Labs: ordered. Radiology: ordered.  Risk Prescription drug management.   Patient presents to the emergency department with concerns over possible sickle cell crisis as well as flulike symptoms.  Patient experiencing generalized body aches that she has experienced before as a part of her sickle cell disease.  Is not anemic, doubt that this is a significant acute sickle crisis.  Her symptoms sound more like a viral illness.  She has tested positive for COVID which would explain her constellation of symptoms.  She appears well otherwise.  Vital signs are normal.  No hypoxia, lungs are clear on auscultation.  Chest x-ray is clear as well, no sequela of COVID or pneumonia.  Patient is mildly hyperglycemic without signs of DKA.  This is secondary to not holding down her medications.  This was treated with IV fluids.  She was administered antiemetics.  She was also administered parenteral analgesia.  She has not been holding down her normal pain medications, likely having some withdrawal symptoms from this as well.  Patient much improved after IV Dilaudid.  She was initiated on Paxlovid.  Will be given prescriptions for symptomatic treatment for COVID and follow-up precautions.        Final Clinical Impression(s) / ED Diagnoses Final diagnoses:  Sickle cell anemia with pain ([place])  COVID-19    Rx / DC Orders ED Discharge Orders          Ordered    lidocaine (XYLOCAINE) 2 % solution  As needed        01/09/22 0612    benzonatate (TESSALON)  200 MG capsule  3 times daily PRN        01/09/22 0612    diphenoxylate-atropine (LOMOTIL) 2.5-0.025 MG tablet  4 times daily PRN        01/09/22 0612    ondansetron (ZOFRAN) 4 MG tablet  Every 6 hours PRN        01/09/22 0612              Orpah Greek, MD 01/09/22 [phone]

## 2022-01-10 LAB — TYPE AND SCREEN
ABO/RH(D): O POS
Antibody Screen: NEGATIVE

## 2022-01-26 ENCOUNTER — Other Ambulatory Visit: Payer: Self-pay | Admitting: *Deleted

## 2022-01-26 NOTE — Patient Outreach (Signed)
[facility]) Care Management [place] Note   01/26/2022 Name:  Cynthia Rosales MRN:  [id number] DOB:  12/13/1980  Summary: Per patient she monitors blood pressure randomly. Last readings are good. Per patient she is monitoring blood sugar regularly. Fasting blood sugar is 130. Her appetite is good.   Recommendations/Changes made from today's visit: Monitor blood sugars and blood pressure Obtain  A1C results Medication adherence   Subjective: Cynthia Rosales is an 41 y.o. year old female who is a primary patient of Elwyn Reach, MD. The care management team was consulted for assistance with care management and/or care coordination needs.    RN Health Coach completed Telephone Visit today.   Objective:  Medications Reviewed Today     Reviewed by Valente David, RN (Registered Nurse) on 05/22/21 at [place] List Status: <None>   Medication Order Taking? Sig Documenting Provider Last Dose Status Informant  diazepam (VALIUM) 5 MG tablet [id number]  Take 5 mg by mouth 2 (two) times daily as needed for muscle spasms. [provider]  Active Self  hydrochlorothiazide (HYDRODIURIL) 25 MG tablet [id number]  Take 25 mg by mouth daily.  [provider]  Active Self  NOVOLOG MIX 70/30 FLEXPEN (70-30) 100 UNIT/ML FlexPen [id number]  Inject 36 Units into the skin in the morning, at noon, and at bedtime.  [provider]  Active Self  oxycodone (ROXICODONE) 30 MG immediate release tablet [id number]  Take 1 tablet (30 mg total) by mouth every 6 (six) hours as needed for pain.  Patient taking differently: Take 45 mg by mouth every 4 (four) hours as needed for pain.   Dorena Dew, FNP  Active   sitaGLIPtin (JANUVIA) 100 MG tablet [id number]  Take 100 mg by mouth daily. [provider]  Active Self             SDOH:  (Social Determinants of Health) assessments and interventions performed:  SDOH Interventions    Flowsheet Row  Most Recent Value  SDOH Interventions   Food Insecurity Interventions Intervention Not Indicated  Transportation Interventions Intervention Not Indicated       Care Plan  Review of patient past medical history, allergies, medications, health status, including review of consultants reports, laboratory and other test data, was performed as part of comprehensive evaluation for care management services.   Care Plan : Hypertension (Adult)  Updates made by Verlin Grills, RN since 01/26/2022 12:00 AM     Problem: Disease Progression (Hypertension) Resolved 01/26/2022  Note:   Resolving due to duplicate goal     Goal: Disease Progression Prevented or Minimized Completed 01/26/2022  Start Date: 05/22/2021  Expected End Date: 08/21/2021  Recent Progress: On track  Priority: Medium  Note:   Evidence-based guidance:  Tailor lifestyle advice to individual; review progress regularly; give frequent encouragement and respond positively to incremental successes.  Assess for and promote awareness of worsening disease or development of comorbidity.  Prepare patient for laboratory and diagnostic exams based on risk and presentation.  Prepare patient for use of pharmacologic therapy that may include diuretic, beta-blocker, beta-blocker/thiazide combination, angiotensin-converting enzyme inhibitor, renin-angiotensin blocker or calcium-channel blocker.  Expect periodic adjustments to pharmacologic therapy; manage side effects.  Promote a healthy diet that includes primarily plant-based foods, such as fruits, vegetables, whole grains, beans and legumes, low-fat dairy and lean meats.   Consider moderate reduction in sodium intake by avoiding the addition of salt to prepared foods and limiting processed meats, canned  soup, frozen meals and salty snacks.   Promote a regular, daily exercise goal of 150 minutes per week of moderate exercise based on tolerance, ability and patient choice; consider referral to  physical therapist, community wellness and/or activity program.  Encourage the avoidance of no more than 2 hours per day of sedentary activity, such as recreational screen time.  Review sources of stress; explore current coping strategies and encourage use of mindfulness, yoga, meditation or exercise to manage stress.   Notes:     Task: Alleviate Barriers to Hypertension Treatment Completed 01/26/2022  Due Date: 08/21/2021  Note:   Care Management Activities:    - healthy diet promoted - healthy family lifestyle promoted - reduction in sedentary activities encouraged    Notes:     Care Plan : [place] of Care  Updates made by Katira Dumais, Eppie Gibson, RN since 01/26/2022 12:00 AM     Problem: Knowledge [place] Relatedto Hypertension and Care Coordination Needs   Priority: High     Long-Range Goal: Development Plan of Care for Management of Hypertension   Priority: High  Note:   Current Barriers:  Knowledge Deficits related to plan of care for management of HTN   RNCM Clinical Goal(s):  Patient will verbalize understanding of plan for management of HTN and DMII as evidenced by continuation of monitoring blood pressure, blood sugars and adhering to 2300 mg low sodium diabetic diet  through collaboration with RN Care manager, provider, and care team.   Interventions:  Inter-disciplinary care team collaboration (see longitudinal plan of care) Evaluation of current treatment plan related to  self management and patient's adherence to plan as established by provider   Hypertension Interventions:  (Status:  Goal on track:  Yes.) Long Term Goal Last practice recorded BP readings:  BP Readings from Last 3 Encounters:  01/09/22 135/86  07/30/21 (!) 137/91  07/10/21 (!) 150/91  Most recent eGFR/CrCl: No results found for: EGFR  No components found for: CRCL  Evaluation of current treatment plan related to hypertension self management and patient's adherence to plan as  established by provider  Patient Goals/Self-Care Activities: Take all medications as prescribed Attend all scheduled provider appointments Call pharmacy for medication refills 3-7 days in advance of running out of medications Perform all self care activities independently  Perform IADL's (shopping, preparing meals, housekeeping, managing finances) independently Call provider office for new concerns or questions  call the Suicide and Crisis Lifeline: 988 if experiencing a Mental Health or [place]  check feet daily for cuts, sores or redness enter blood sugar readings and medication or insulin into daily log take the blood sugar log to all doctor visits trim toenails straight across drink 6 to 8 glasses of water each day manage portion size wash and dry feet carefully every day wear comfortable, cotton socks wear comfortable, well-fitting shoes check blood pressure weekly choose a place to take my blood pressure (home, clinic or office, retail store) learn about high blood pressure call doctor for signs and symptoms of high blood pressure keep all doctor appointments take medications for blood pressure exactly as prescribed report new symptoms to your doctor limit salt intake to 2300 mg/day  Follow Up Plan:  Telephone follow up appointment with care management team member scheduled for:  May 2023 The patient has been provided with contact information for the care management team and has been advised to call with any health related questions or concerns.        Plan:  Telephone follow up appointment with care management team member scheduled for:  May 2023 The patient has been provided with contact information for the care management team and has been advised to call with any health related questions or concerns.   [place] [facility] [phone] .

## 2022-04-29 ENCOUNTER — Other Ambulatory Visit: Payer: Self-pay | Admitting: *Deleted

## 2022-04-29 NOTE — Patient Outreach (Signed)
[facility]) Care Management  04/29/2022  Cynthia Rosales 11/01/1981 [id number]   RN Health Coach telephone call to patient.  [employer] compliance verified. Per patient she is feeling bad. Her throat is scratchy and hurts. Patient asked for call back at a later date.  Plan:  RN will call back within 30 days  [place] Management [phone]

## 2022-05-14 ENCOUNTER — Ambulatory Visit: Payer: Medicare Other | Attending: Primary Care

## 2022-05-14 DIAGNOSIS — R262 Difficulty in walking, not elsewhere classified: Secondary | ICD-10-CM | POA: Diagnosis present

## 2022-05-14 DIAGNOSIS — M6281 Muscle weakness (generalized): Secondary | ICD-10-CM | POA: Insufficient documentation

## 2022-05-14 NOTE — Therapy (Signed)
OUTPATIENT PHYSICAL THERAPY WHEELCHAIR EVALUATION   Patient Name: Cynthia Rosales MRN: [id number] DOB:01-Sep-1981, 41 y.o., female Today's Date: 05/15/2022   PT End of Session - 05/14/22 1426     Visit Number 1    Number of Visits 1    Authorization Type Medicare/Medicaid    PT Start Time 1316    PT Stop Time 1417    PT Time Calculation (min) 61 min    Activity Tolerance Patient tolerated treatment well    Behavior During Therapy [facility] for tasks assessed/performed             Past Medical History:  Diagnosis Date   Abscess of bursa, left elbow 06/2013   Treated with I and D/antibiotics.    Anemia    Arthritis    Asthma    Depression    History of blood transfusion    "after I had one of my kids"   History of kidney stones    Hypertension    Ovarian cyst    Schizophrenia ([place])    Sickle cell anemia (HCC)    Type II diabetes mellitus ([place])    Past Surgical History:  Procedure Laterality Date   APPENDECTOMY  2013   BILIARY STENT PLACEMENT N/A 05/02/2018   Procedure: BILIARY STENT PLACEMENT;  Surgeon: Doran Stabler, MD;  [facility];  Service: Gastroenterology;  Laterality: N/A;   BOWEL RESECTION  12/03/2020   Procedure: SMALL BOWEL RESECTION;  Surgeon: Kieth Brightly, Arta Bruce, MD;  Location: [place];  Service: General;;   CARPAL TUNNEL RELEASE     CESAREAN SECTION  2000; 2007; 2011   CHOLECYSTECTOMY N/A 04/03/2018   Procedure: LAPAROSCOPIC CHOLECYSTECTOMY;  Surgeon: Rolm Bookbinder, MD;  Location: [place];  Service: General;  Laterality: N/A;   [place] N/A 10/22/2020   Procedure: DILATATION & CURETTAGE/HYSTEROSCOPY WITH MYOSURE;  Surgeon: Cheri Fowler, MD;  Location: [place];  Service: Gynecology;  Laterality: N/A;   DILATION AND CURETTAGE OF UTERUS     ERCP N/A 05/02/2018   Procedure: ENDOSCOPIC RETROGRADE CHOLANGIOPANCREATOGRAPHY (ERCP);  Surgeon: Doran Stabler, MD;  [facility];  Service:  Gastroenterology;  Laterality: N/A;   ERCP N/A 07/26/2018   Procedure: ENDOSCOPIC RETROGRADE CHOLANGIOPANCREATOGRAPHY (ERCP);  Surgeon: Irene Shipper, MD;  Location: [facility];  Service: Endoscopy;  Laterality: N/A;  stent removal   IR RADIOLOGIST EVAL & MGMT  05/19/2018   LAMINECTOMY N/A 12/13/2014   Procedure: CERVICAL LAMINECTOMY FOR INTRADURAL TUMOR;  Surgeon: Charlie Pitter, MD;  Location: [place] [facility];  Service: Neurosurgery;  Laterality: N/A;  posterior   LAPAROSCOPY N/A 07/20/2017   Procedure: LAPAROSCOPY OPERATIVE WITH WEDGE RESECTION RIGHT CORNUA AND PARTIAL SALPINGECTOMY;  Surgeon: [name], MD;  Location: [place] [facility];  Service: Gynecology;  Laterality: N/A;   REDUCTION MAMMAPLASTY Bilateral 1998   REMOVAL OF STONES  05/02/2018   Procedure: REMOVAL OF STONES;  Surgeon: Doran Stabler, MD;  [facility];  Service: Gastroenterology;;   [name]  05/02/2018   Procedure: [id number];  Surgeon: Doran Stabler, MD;  [facility];  Service: Gastroenterology;;   Lavell Islam REMOVAL  07/26/2018   Procedure: STENT REMOVAL;  Surgeon: Irene Shipper, MD;  [facility];  Service: Endoscopy;;   TOTAL LAPAROSCOPIC HYSTERECTOMY WITH SALPINGECTOMY Bilateral 12/03/2020   Procedure: ATTEMPTED  LAPAROSCOPIC, OPEN ABDOMINAL HYSTERECTOMY WITH BILATERAL SALPINGO-OOPHERECTOMY AND LYSIS OF ADHESIONS;  Surgeon: Cheri Fowler, MD;  Location: [place];  Service: Gynecology;  Laterality:  Bilateral;   Patient Active Problem List   Diagnosis Date Noted   Uterine leiomyoma 12/03/2020   S/P abdominal hysterectomy 12/03/2020   Bile leak, postoperative    Choledocholithiasis    Intra-abdominal fluid collection    Anemia 04/28/2018   Morbid obesity with body mass index (BMI) of 50.0 to 59.9 in adult [facility]) 04/02/2018   Mass of uterus 04/02/2018   Transaminitis 04/02/2018   Acute cholecystitis s/p lap cholecystectomy 04/03/2018 04/01/2018   Pregnancy, ectopic, cornual or cervical  07/21/2017   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   Neurogenic bladder 08/23/2015   Bilateral carpal tunnel syndrome 08/23/2015   Paraparesis of both lower limbs ([place]) 07/14/2015   Ileus, postoperative ([place]) 12/25/2014   Diabetes mellitus type 2 in obese ([place]) 12/25/2014   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[place]) 12/24/2014   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[place]) 12/24/2014   Ileus of unspecified type ([place])    Atelectasis    Bowel obstruction ([place])    Encounter for central line care    Ileus [facility])    Intestinal occlusion ([place])    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   Headache 11/19/2014   Neck pain 11/19/2014   Right sided weakness 11/19/2014   Paresthesias 11/19/2014   Nightmares 09/11/2014   HTN (hypertension), benign 03/28/2014   Nephrolithiasis 03/27/2014   Diabetes mellitus ([place]) 03/27/2014   Abdominal pain 03/27/2014   PCP: Gala Romney, MD  REFERRING PROVIDER: Gala Romney, MD  THERAPY DIAG:  Muscle weakness (generalized)  Difficulty in walking, not elsewhere classified  Rationale for Evaluation and Treatment Rehabilitation  SUBJECTIVE:                                                                                                                                                                                           SUBJECTIVE STATEMENT: Pt present for wheelchair evaluation. She reports having multiple falls resulting in injury since her powerchair broke in November 2022.   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    Anemia    Arthritis    Asthma    Depression    History of blood transfusion    "after I had one of my kids"   History of kidney stones    Hypertension    Ovarian cyst    Schizophrenia ([place])    Sickle cell anemia (HCC)    Type II diabetes mellitus ([place])             Cardio Status:  Functional Limitations: h/o HTN  '[]'$ Intact  '[x]'$  Impaired      Respiratory Status:  Functional Limitations: h/o asthma; C1-C4 incomplete quadriplegia   '[]'$ Intact  '[x]'$ Impaired   '[]'$ SOB '[]'$ COPD '[]'$ O2 Dependent ______LPM  '[]'$ Ventilator Dependent  Resp equip:                                                     Objective Measure(s):   Orthotics:   '[]'$ Amputee:                                                             '[]'$ Prosthesis:      HOME ENVIRONMENT:  '[x]'$ House '[]'$ Condo/town home '[]'$ Apartment '[]'$ Asst living '[]'$ LTCF         '[x]'$ Own  '[]'$ Rent   '[]'$ Lives alone '[x]'$ Lives with others -           aides                  Hours without assistance: 0  '[x]'$ Home is accessible to patient                                 Storage of wheelchair:  '[x]'$ In home   '[]'$ Other Comments:       COMMUNITY :  TRANSPORTATION:  '[]'$ Car '[]'$ Van '[x]'$ Public Transportation '[]'$ Adapted w/c Lift '[]'$  Ambulance '[]'$ Other:                     '[]'$ Sits in wheelchair during transport   Where is w/c stored during transport? In [place] '[]'$ [place]  '[]'$  [employer]  r   '[]'$ Self-Driver       Drive while in  Wheelchair '[]'$ yes '[x]'$ no   Employment and/or school:  Patient on disability due to her various diagnoses       Other:  COMMUNICATION:  Verbal Communication  '[x]'$ WFL '[]'$ receptive '[x]'$ WFL '[]'$ expressive '[]'$ Understandable  '[]'$ Difficult to understand  '[]'$ non-communicative  Primary Language:___english___________  2nd:_____________  Communication provided by:'[x]'$ Patient '[]'$ Family '[]'$ Caregiver '[]'$ Translator   '[]'$ Uses an Cherlyn Cushing device     Manufacturer/Model :    MOBILITY/BALANCE:  Sitting Balance  Standing Balance  Transfers  Ambulation   '[]'$ WFL      '[]'$ WFL  '[x]'$ Independent- requires use of AD and is pain dependent  '[]'$  Independent   '[x]'$ Uses UE for balance in sitting Comments:  '[x]'$ Uses UE/device for stability Comments: patient very limited by pain and B LE sensory deficits and weakness '[]'$  Min assist  '[]'$  Ambulates independently with       device:___________________      '[]'$   Mod assist  '[]'$  Able to ambulate ______ feet        safely/functionally/independently   '[]'$  Min assist  '[]'$  Min assist  '[]'$  Max assist  '[x]'$  Non-functional ambulator         History/High risk of falls   '[]'$  Mod assist  '[]'$  Mod assist  '[]'$  Dependent  '[]'$  Unable to ambulate   '[]'$  Max  assist  '[]'$  Max assist  Transfer method:r1 person r2 person rsliding board rsquat pivot stand pivot rmechanical patient lift  rother:   '[]'$  Unable  '[]'$  Unable    Fall History: # of falls in the past 6 months? 20- 1 resulting in non-operable L hip fracture # of "near" falls in the past 6 months? ~3/ week    CURRENT SEATING / MOBILITY:  Current Mobility Device: '[]'$ None '[]'$ Cane/Walker '[]'$ Manual '[]'$ Dependent '[]'$ Dependent w/ Tilt rScooter '[x]'$ Power (type of control): joystick  Manufacturer:  Model:  Serial #:   Size:  Color:  Age:   Purchased by whom: [employer] [facility] [employer]  Current condition of mobility base:  broken  Current seating system:                                                                       Age of seating system:  4  Describe posture in present seating system:    Is the current mobility meeting medical necessity?:  '[]'$ Yes '[x]'$ No Describe: Patients current power wheelchair is broken and not safe to use.    Ability to complete Mobility-Related Activities of Daily Living (MRADL's) with Current Mobility Device:   Move room to room  '[]'$ Independent   '[]'$ Min '[]'$ Mod '[]'$ Max assist  '[x]'$ Unable  Comments: Patients ability to complete her MRADLs is significantly dependent on her pain levels due to both her Sickle Cell Anemia and her C1-C4 incomplete quadriplegia.   Meal prep  '[]'$ Independent  '[x]'$ Min '[]'$ Mod '[]'$ Max assist  '[]'$ Unable    Feeding  '[x]'$ Independent  '[]'$ Min '[]'$ Mod '[]'$ Max assist  '[]'$ Unable    Bathing  '[]'$ Independent  '[]'$ Min '[]'$ Mod '[x]'$ Max assist  '[]'$ Unable    Grooming  '[]'$ Independent  '[x]'$ Min '[]'$ Mod '[]'$ Max assist  '[]'$ Unable    UE dressing  '[]'$ Independent  '[x]'$ Min '[]'$ Mod '[]'$ Max assist  '[]'$ Unable    LE dressing  '[]'$ Independent   '[x]'$ Min '[]'$ Mod '[]'$ Max assist  '[]'$ Unable    Toileting  '[]'$ Independent  '[]'$ Min '[]'$ Mod '[x]'$ Max assist  '[]'$ Unable    Bowel Mgt: '[]'$  Continent '[]'$  Incontinent '[x]'$  Accidents '[x]'$  Diapers '[]'$  Colostomy '[]'$  Bowel Program:  Bladder Mgt: '[]'$  Continent '[]'$  Incontinent '[x]'$  Accidents '[x]'$  Diapers '[]'$  Urinal '[]'$  Intermittent Cath '[]'$  Indwelling Cath '[]'$  Supra-pubic Cath     Current Mobility Equipment Trialed/ Ruled Out:    Does not meet mobility needs due to:    [place] all boxes that indicate inability to use the specific equipment listed     Meets needs for safe  independent functional  ambulation  / mobility    Risk of  Falling or History of Falls    Enviromental limitations      Cognition    Safety concerns with  physical ability    Decreased / limitations endurance  & strength     Decreased / limitations  motor skills  & coordination    Pain  Pace /  Speed    Cardiac and/or  respiratory condition    Contra - indicated by diagnosis   Cane/Crutches  '[]'$   '[x]'$   '[]'$   '[]'$   '[x]'$   '[x]'$   '[x]'$   '[x]'$   '[x]'$   '[x]'$   '[]'$    Walker / Rollator  '[]'$  NA   '[]'$   '[x]'$   '[]'$   '[]'$   '[x]'$   '[x]'$   '[x]'$   '[x]'$   '[x]'$   '[x]'$   '[]'$     Manual Wheelchair [id number]:  '[]'$  NA  '[]'$   '[]'$   '[x]'$   '[]'$   '[x]'$   '[x]'$   '[x]'$   '[x]'$   '[x]'$   '[x]'$   '[]'$    Manual W/C ([id number]) with power assist  '[]'$  NA  '[]'$   '[]'$   '[]'$   '[]'$   '[]'$   '[]'$   '[]'$   '[]'$   '[]'$   '[]'$   '[]'$    Scooter  '[]'$  NA  '[]'$   '[]'$   '[]'$   '[]'$   '[]'$   '[]'$   '[]'$   '[]'$   '[]'$   '[]'$   '[]'$    Power Wheelchair: standard joystick  '[]'$   NA  '[x]'$   '[]'$   '[]'$   '[]'$   '[]'$   '[]'$   '[]'$   '[]'$   '[]'$   '[]'$   '[]'$    Power Wheelchair: alternative controls  '[]'$  NA  '[]'$   '[]'$   '[]'$   '[]'$   '[]'$   '[]'$   '[]'$   '[]'$   '[]'$   '[]'$   '[]'$    Summary:  The least costly alternative for independent functional mobility was found to be:    '[]'$  Crutch/Cane  '[]'$  Walker '[]'$  Manual w/c  '[]'$  Manual w/c with power assist   '[]'$  Scooter   '[x]'$  Power w/c std joystick   '[]'$  Power w/c alternative control        '[]'$  Requires dependent care mobility device   Media planner for [employer] skills are adequate for safe mobility equipment operation  '[x]'$   Yes '[]'$   No  Patient is willing and motivated to use recommended mobility equipment  '[x]'$   Yes '[]'$   No       '[]'$  Patient is unable to safely operate mobility equipment independently and requires dependent care equipment Comments:           SENSATION and SKIN ISSUES:  Sensation '[]'$  Intact  '[x]'$  Impaired '[x]'$  Absent '[]'$  Hyposensate '[]'$  Hypersensate  '[]'$  Defensiveness  Location(s) of impairment: B LE, R UE- able to feel deep pressure at times   Pressure Relief Method(s):  '[]'$  Lean side to side to offload (without risk of falling)  '[]'$   W/C push up (4+ times/hour for 15+ seconds) '[]'$  Stand up (without risk of falling)    '[]'$  Other: (Describe): Effective pressure relief method(s) above can be performed consistently throughout the day: rYes  No If not, Why?: Patient unable to safely stand without significant high risk of falling in order to complete pressure relief. Inadequate UE strength to complete wc push up. Limited trunk control and righting reactions to complete lateral leans to offload.   Skin Integrity Risk:       '[]'$  Low risk           '[x]'$  Moderate risk            '[]'$  High risk  If high risk, explain:   Skin Issues/Skin Integrity  Current skin Issues  '[x]'$  Yes '[]'$  No '[]'$  Intact  '[]'$   Red area   '[]'$   Open area  '[]'$  Scar tissue  '[x]'$  At risk from prolonged sitting  Where: ischial tuberosity, sacrum  History of Skin Issues  '[x]'$  Yes '[]'$   No Where : R  ischial tuberosity  When: 2016 Stage: 1-2 Hx of skin flap surgeries  '[]'$  Yes '[x]'$  No Where:  When:  Pain: '[x]'$  Yes '[]'$  No   Pain Location(s): generalized due to Sickle Cell Anemia and C1-C4 incomplete quadriplegia Intensity scale: (0-10) :10 How does pain interfere with mobility and/or MRADLs? - pain substantially limiting endurance and ability to complete any and all MRADs   MAT EVALUATION:  Neuro-Muscular Status: (Tone, Reflexive, Responses, etc.)     '[]'$   Intact   '[]'$  Spasticity:  '[]'$  Hypotonicity  '[]'$  Fluctuating  '[x]'$  Muscle Spasms  '[x]'$  Poor Righting Reactions/Poor Equilibrium Reactions  '[]'$  Primal Reflex(s):    Comments: Due to patients C1-4 incomplete quadriplegia, patient has delayed and inadequate righting reactions. Also due to this diagnosis, she experiences frequent and painful muscle spasms.            COMMENTS:    POSTURE:     Comments:  Pelvis Anterior/Posterior:  '[]'$  Neutral   '[x]'$  Posterior  '[]'$  Anterior  '[]'$  Fixed - No movement '[x]'$  Tendency away from neutral '[x]'$  Flexible '[x]'$  Self-correction '[x]'$  External correction Obliquity (viewed from front)  '[x]'$  WFL '[]'$  R Obliquity '[]'$  L Obliquity  '[]'$  Fixed - No movement '[]'$  Tendency away from neutral '[]'$  Flexible '[]'$  Self-correction '[]'$  External correction Rotation  '[x]'$  WFL '[]'$  R anterior '[]'$  L anterior  '[]'$  Fixed - No movement '[]'$  Tendency away from neutral '[]'$  Flexible '[]'$  Self-correction '[]'$  External correction Tonal Influence Pelvis:  '[]'$  Normal '[]'$  Flaccid '[]'$  Low tone '[x]'$  Spasticity '[]'$  Dystonia '[]'$  Pelvis thrust '[]'$  Other:    Trunk Anterior/Posterior:  '[]'$  WFL '[x]'$  Thoracic kyphosis '[]'$  Lumbar lordosis  '[]'$  Fixed - No movement '[x]'$  Tendency away from neutral '[x]'$  Flexible '[x]'$  Self-correction '[x]'$  External correction  '[x]'$  WFL '[]'$  Convex to left  '[]'$  Convex to right '[]'$  S-curve   '[]'$  C-curve '[]'$  Multiple curves '[]'$  Tendency away from neutral '[]'$  Flexible '[]'$  Self-correction '[]'$  External correction Rotation of shoulders  and upper trunk:  '[x]'$  Neutral '[]'$  Left-anterior '[]'$  Right- anterior '[]'$  Fixed- no movement '[]'$  Tendency away from neutral '[]'$  Flexible '[]'$  Self correction '[]'$  External correction Tonal influence Trunk:  '[]'$  Normal '[]'$  Flaccid '[]'$  Low tone '[x]'$  Spasticity '[]'$  Dystonia '[]'$  Other:   Head & Neck  '[x]'$  Functional '[]'$  Flexed    '[]'$  Extended '[]'$  Rotated right  '[]'$  Rotated left '[]'$  Laterally flexed right '[]'$  Laterally flexed left '[]'$  Cervical hyperextension   '[]'$  Good head control '[x]'$  Adequate head control '[]'$  Limited head control '[]'$  Absent head control Describe tone/movement of head and neck:  limited pain-free cervical ROM due to surgical site   Lower Extremity Measurements: LE ROM:  Active ROM Right 05/15/2022 Left 05/15/2022  Hip flexion ~90* ~90*  Hip extension    Hip abduction    Hip adduction    Knee flexion    Knee extension    Ankle dorsiflexion    Ankle plantarflexion     (Blank rows = not tested)  LE MMT:  MMT Right 05/15/2022 Left 05/15/2022  Hip flexion 2 2  Hip extension    Hip abduction 2 2  Hip adduction 2 2  Knee flexion 2 2  Knee extension 1+ 1  Ankle dorsiflexion 1 1  Ankle plantarflexion 2 2   (Blank rows = not tested)  Hip positions:  '[]'$  Neutral   '[x]'$  Abducted   '[]'$  Adducted  '[]'$  Subluxed   '[]'$  Dislocated   '[]'$  Fixed   '[x]'$  Tendency away  from neutral '[x]'$  Flexible '[x]'$  Self-correction '[x]'$  External correction   Hip Windswept:'[x]'$  Neutral  '[]'$  Right    '[]'$  Left  '[]'$  Subluxed   '[]'$  Dislocated   '[]'$  Fixed   '[]'$  Tendency away from neutral '[]'$  Flexible '[]'$  Self-correction '[]'$  External correction  LE Tone: '[]'$  Normal '[]'$  Low tone '[x]'$  Spasticity '[]'$  Flaccid '[]'$  Dystonia '[]'$  Rocks/Extends at hip '[]'$  Thrust into knee extension '[]'$  Pushes legs downward into footrest  Foot positioning: ROM Concerns: Dorsiflexed: '[]'$  Right   '[]'$  Left Plantar flexed: '[]'$  Right    '[]'$  Left Inversion: '[x]'$  Right    '[x]'$  Left Eversion: '[]'$  Right    '[]'$  Left  LE Edema: '[]'$  1+ (Barely detectable  impression when finger is pressed into skin) '[x]'$  2+ (slight indentation. 15 seconds to rebound) '[]'$  3+ (deeper indentation. 30 seconds to rebound) '[]'$  4+ (>30 seconds to rebound)  UE Measurements:  UPPER EXTREMITY ROM:   Active ROM Right 05/15/2022 Left 05/15/2022  Shoulder flexion ~110* ~110*  Shoulder abduction 90* 90*  Shoulder adduction    Elbow flexion    Elbow extension    Wrist flexion    Wrist extension    (Blank rows = not tested)  *Patient with significant increase in pain with B UE movement.  UPPER EXTREMITY MMT:  MMT Right 05/15/2022 Left 05/15/2022  Shoulder flexion 3 3  Shoulder abduction 3 3  Shoulder adduction 4 4  Elbow flexion 2 2  Elbow extension 3 3  Wrist flexion 3 3  Wrist extension 3 3  Pinch strength    Grip strength    (Blank rows = not tested)  Shoulder Posture:  Right Tendency towards Left  '[]'$   Functional '[]'$    '[]'$   Elevation '[]'$    '[x]'$   Depression '[x]'$    '[x]'$   Protraction '[x]'$    '[]'$   Retraction '[]'$    '[x]'$   Internal rotation '[x]'$    '[]'$   External rotation '[]'$    '[]'$   Subluxed '[]'$     UE Tone: '[x]'$  Normal '[]'$  Flaccid '[]'$  Low tone '[]'$  Spasticity  '[]'$  Dystonia '[]'$  Other:   UE Edema: '[x]'$  1+ (Barely detectable impression when finger is pressed into skin) '[]'$  2+ (slight indentation. 15 seconds to rebound) '[]'$  3+ (deeper indentation. 30 seconds to rebound) '[]'$  4+ (>30 seconds to rebound)  Wrist/Hand: Handedness: '[x]'$  Right   '[]'$  Left   '[]'$  NA: Comments:  Right  Left  '[]'$   WNL '[]'$    '[]'$   Limitations '[]'$    '[]'$   Contractures '[]'$    '[]'$   Fisting '[]'$    '[]'$   Tremors '[]'$    '[x]'$   Weak grasp '[x]'$    '[x]'$   Poor dexterity '[]'$    '[]'$   Hand movement non functional '[]'$    '[]'$   Paralysis '[]'$     MOBILITY BASE RECOMMENDATIONS and JUSTIFICATION:  MOBILITY BASE  JUSTIFICATION   Manufacturer:   [employer]oc[employer] Model:  TDX2 HD                            Color: purple and orange Seat Width:  21" Seat Depth 20"   '[]'$  Manual mobility base (continue below)   '[x]'$  Scooter/POV  '[x]'$  Power mobility base    Number of hours per day spent in above selected mobility base: 12  Typical daily mobility base use Schedule:  Patient able to transfer into power wheelchair to complete all of her mobility related ADLs and IADLs.    '[x]'$  is not a safe, functional ambulator  '[x]'$  limitation prevents from  completing a MRADL(s) within a reasonable time frame    '[x]'$  limitation places at high risk of morbidity or mortality secondary to  the attempts to perform a    MRADL(s)  '[]'$  limitation prevents accomplishing a MRADL(s) entirely  '[x]'$  provide independent mobility  '[x]'$  equipment is a lifetime medical need  '[x]'$  walker or cane inadequate  '[x]'$  any type manual wheelchair      inadequate  '[x]'$  scooter/POV inadequate      '[]'$  requires dependent mobility        POWER MOBILITY      '[]'$  Scooter/POV    '[]'$  can safely operate   '[]'$  can safely transfer   '[]'$  has adequate trunk stability   '[]'$  cannot functionally propel  manual wheelchair    '[x]'$  Power mobility base   TDX2 HD '[x]'$  non-ambulatory   '[x]'$  cannot functionally propel manual wheelchair   '[x]'$  cannot functionally and safely      operate scooter/POV  '[x]'$  can safely operate power       wheelchair  '[x]'$  home is accessible  '[x]'$  willing to use power wheelchair     Tilt  '[x]'$  Powered tilt on powered chair  '[]'$  Powered tilt on manual chair '[]'$  Manual tilt on manual chair Comments:  '[x]'$  change position for pressure      '[x]'$  relief/cannot weight shift   '[x]'$  change position against      gravitational force on head and      shoulders   '[x]'$  decrease pain  '[]'$  blood pressure management   '[]'$  control autonomic dysreflexia  '[]'$  decrease respiratory distress  '[x]'$  management of spasticity  '[]'$  management of low tone  '[x]'$  facilitate postural control   '[x]'$  rest periods   '[x]'$  control edema  '[x]'$  increase sitting tolerance   '[]'$  aid with transfers     Recline   '[]'$  Power recline on power chair  '[]'$  Manual recline on manual chair  Comments:    '[]'$  intermittent catheterization  '[]'$   manage spasticity  '[]'$  accommodate femur to back angle  '[]'$  change position for pressure relief/cannot weight shift rhigh risk of pressure sore development  '[]'$  tilt alone does not accomplish     effective pressure relief, maximum pressure relief achieved at -     _______ degrees tilt       _______ degrees recline   '[]'$  difficult to transfer to and from bed '[]'$  rest periods and sleeping in chair  '[]'$  repositioning for transfers  '[]'$  bring to full recline for ADL care  '[]'$  clothing/diaper changes in chair  '[]'$  gravity PEG tube feeding  '[]'$  head positioning  '[]'$  decrease pain  '[]'$  blood pressure management   '[]'$  control autonomic dysreflexia  '[]'$  decrease respiratory distress  '[]'$  user on ventilator     Elevator on mobility base  '[]'$  Power wheelchair  '[]'$  Scooter  '[]'$  increase Indep in transfers   '[]'$  increase Indep in ADLs    '[]'$  bathroom function and safety  '[]'$  kitchen/cooking function and safety  '[]'$  shopping  '[]'$  raise height for communication at standing level  '[]'$  raise height for eye contact which reduces cervical neck strain and pain  '[]'$  drive at raised height for safety and navigating crowds  '[]'$  Other:   '[]'$  Vertical position system (anterior tilt)     (Drive locks-out)    '[]'$  Stand       (Drive enabled)  '[]'$  independent weight bearing  '[]'$  decrease joint contractures  '[]'$  decrease/manage spasticity  '[]'$  decrease/manage  spasms  '[]'$  pressure distribution away from   scapula, sacrum, coccyx, and ischial tuberosity  '[]'$  increase digestion and elimination   '[]'$  access to counters and cabinets  '[]'$  increase reach  '[]'$  increase interaction with others at eye level, reduces neck strain  '[]'$  increase performance of       MRADL(s)      Power elevating legrest    '[x]'$  Center mount (Single) 85-170 degrees       '[]'$  Standard (Pair) 100-170 degrees  '[]'$  position legs at 90 degrees, not available with std power ELR  '[]'$  center mount tucks into chair to decrease turning radius in home, not available with std power  ELR  '[x]'$  provide change in position for LE  '[x]'$  elevate legs during recline    '[]'$  maintain placement of feet on      footplate  '[x]'$  decrease edema  '[x]'$  improve circulation  '[x]'$  actuator needed to elevate legrest  '[x]'$  actuator needed to articulate legrest preventing knees from flexing  '[x]'$  Increase ground clearance over      curbs  '[]'$   STD (pair) independently                     elevate legrest   POWER WHEELCHAIR CONTROLS      Controls/input device  '[]'$  Expandable  '[x]'$  Non-expandable  '[x]'$  Proportional  '[x]'$  Right Hand '[]'$  Left Hand  '[]'$  Non-proportional/switches/head-array  '[]'$  Electrical/proximity         '[]'$   Mechanical      Manufacturer:___invocare_________   Type:__joystick________________ '[x]'$  provides access for controlling wheelchair  '[x]'$  programming for accurate control  '[]'$  progressive disease/changing condition  '[]'$  required for alternative drive      controls       '[]'$  lacks motor control to operate  proportional drive control  '[]'$  unable to understand proportional controls  '[]'$  limited movement/strength  '[]'$  extraneous movement / tremors / ataxic / spastic     '[]'$  Upgraded electronics      controller/harness    '[]'$  Single power (tilt or recline)   '[]'$  Expandable    '[]'$  Non-expandable plus   '[]'$  Multi-power (tilt, recline, power legrest, power seat lift, vertical positioning system, stand)  '[]'$  allows input device to communicate with drive motors  '[]'$  harness provides necessary     connections between the controller, input device, and seat functions     '[]'$  needed in order to operate   power seat functions through    joystick/ input device  '[]'$  required for alternative drive      controls     '[]'$  Enhanced display  '[]'$  required to connect all alternative drive controls   '[]'$  required for upgraded joystick      (lite-throw, heavy duty, micro)  '[]'$  Allows user to see in which mode and drive the wheelchair is set; necessary for alternate controls       '[]'$  Upgraded tracking electronics  '[]'$   correct tracking when on uneven surfaces makes switch driving more efficient and less fatiguing  '[]'$  increase safety when driving  '[]'$  increase ability to traverse      thresholds    '[]'$  Safety / reset / mode switches     Type:    '[]'$  Used to change modes and stop the wheelchair when driving     '[x]'$  Mount for joystick / input device/switches : swingaway  '[x]'$  swing away for access or      transfers   '[x]'$  attaches joystick / input  device / switches to wheelchair   '[x]'$  provides for consistent access  '[]'$  midline for optimal placement    '[]'$  Attendant controlled joystick plus     mount  '[]'$  safety  '[]'$  long distance driving  '[]'$  operation of seat functions  '[]'$  compliance with transportation regulations    '[x]'$  Battery  '[x]'$  required to power (power assist / scooter/ power wc / other):       '[]'$  Power inverter (24V to 12V)  '[]'$  required for ventilator / respiratory equipment / other:     CHAIR OPTIONS MANUAL & POWER      Armrests   '[x]'$  adjustable height rremovable  '[]'$  swing away '[]'$  fixed  '[x]'$  flip back  '[]'$  reclining  '[x]'$  full length pads '[]'$  desk '[]'$  tube arms '[]'$  gel pads  '[x]'$  provide support with elbow at 90    '[x]'$  remove/flip back/swing away for  transfers  '[x]'$  provide support and positioning of upper body    '[]'$  allow to come closer to table top  '[]'$  remove for access to tables  '[]'$  provide support for w/c tray  '[x]'$  change of height/angles for       variable activities   '[]'$  Elbow support / Elbow stop  '[]'$  keep elbow positioned on arm pad  '[]'$  keep arms from falling off arm pad      during tilt and/or recline   Upper Extremity Support  '[]'$  Arm trough  '[]'$   R  '[]'$   L  Style:  '[]'$  swivel mount '[]'$  fixed mount   '[]'$  posterior hand support  '[]'$   tray  '[]'$  full tray  '[]'$  joystick cut out  '[]'$   R  '[]'$   L  Style:  '[]'$  decrease gravitational pull on      shoulders  '[]'$  provide support to increase UE  function  '[]'$  provide hand support in natural    position  '[]'$  position flaccid UE  '[]'$  decrease subluxation    rdecrease  edema       '[]'$  manage spasticity   '[]'$  provide midline positioning  '[]'$  provide work surface  '[]'$  placement for AAC/ Computer/ EADL    Hangers/ Legrests   '[]'$  ______ degree  '[x]'$  Elevating '[x]'$  articulating  '[]'$  swing away '[]'$  fixed '[]'$  lift off  '[x]'$  heavy duty '[]'$  adjustable knee angle  '[x]'$  adjustable calf panel   '[]'$  longer extension tube              '[x]'$  provide LE support  '[x]'$  maintain placement of feet on      footplate   '[x]'$  accommodate lower leg length  '[]'$  accommodate to hamstring       tightness  '[]'$  enable transfers  '[x]'$  provide change in position for LE's  '[x]'$  elevate legs during recline    '[x]'$  decrease edema  '[x]'$  durability      Foot support   '[x]'$  footplate '[]'$  R '[]'$  L '[]'$  flip up           '[x]'$  Depth adjustable   '[x]'$  angle adjustable  '[]'$  foot board/one piece    '[x]'$  provide foot support  '[]'$  accommodate to ankle ROM  '[]'$  allow foot to go under wheelchair base  '[]'$  enable transfers     '[]'$  Shoe holders  '[]'$  position foot    '[]'$  decrease / manage spasticity  '[]'$  control position of LE  '[]'$  stability    '[]'$  safety     '[]'$  Ankle strap/heel      loops  '[]'$   support foot on foot support  '[]'$  decrease extraneous movement  '[]'$  provide input to heel   '[]'$  protect foot     '[]'$  Amputee adapter '[]'$  R  '[]'$  L     Style:                  Size:  '[]'$  Provide support for stump/residual extremity    '[]'$  Transportation tie-down  '[]'$  to provide crash tested tie-down brackets    '[]'$  Crutch/cane holder    '[]'$  O2 holder    '[]'$  IV hanger   '[]'$  Ventilator tray/mount    '[]'$  stabilize accessory on wheelchair       Component  Justification     '[x]'$  Seat cushion     TruComfort '[x]'$  accommodate impaired sensation  '[x]'$  decubitus ulcers present or history  '[x]'$  unable to shift weight  '[x]'$  increase pressure distribution  '[x]'$  prevent pelvic extension  '[]'$  custom required "off-the-shelf"    seat cushion will not accommodate deformity  '[x]'$  stabilize/promote pelvis alignment  '[x]'$  stabilize/promote femur alignment  '[]'$  accommodate  obliquity  '[]'$  accommodate multiple deformity  '[x]'$  incontinent/accidents  '[]'$  low maintenance     '[]'$  seat mounts                 '[]'$  fixed '[]'$  removable  '[]'$  attach seat platform/cushion to wheelchair frame    '[]'$  Seat wedge    '[]'$  provide increased aggressiveness of seat shape to decrease sliding  down in the seat  '[]'$  accommodate ROM        '[x]'$  Cover replacement   '[x]'$  protect back or seat cushion  '[x]'$  incontinent/accidents    '[]'$  Solid seat / insert    '[]'$  support cushion to prevent      hammocking  '[]'$  allows attachment of cushion to mobility base    '[]'$  Lateral pelvic/thigh/hip     support (Guides)     '[]'$  decrease abduction  '[]'$  accommodate pelvis  '[]'$  position upper legs  '[]'$  accommodate spasticity  '[]'$  removable for transfers     '[]'$  Lateral pelvic/thigh      supports mounts  '[]'$  fixed   '[]'$  swing-away   '[]'$  removable  '[]'$  mounts lateral pelvic/thigh supports     '[]'$  mounts lateral pelvic/thigh supports swing-away or removable for transfers    '[]'$  Medial thigh support ([place]mel)  rdecrease adduction raccommodate ROM  rremove for transfers    ralignment      '[]'$  Medial thigh   '[]'$  fixed      support mounts      '[]'$  swing-away   '[]'$  removable  '[]'$  mounts medial thigh supports   '[]'$  Mounts medial supports swing- away or removable for transfers     Component  Justification   '[x]'$  Back    Contour   '[x]'$  provide posterior trunk support '[]'$  facilitate tone  '[x]'$  provide lumbar/sacral support '[]'$  accommodate deformity  '[x]'$  support trunk in midline   '[]'$  custom required "off-the-shelf" back support will not accommodate deformity   '[x]'$  provide lateral trunk support '[]'$  accommodate or decrease tone            '[]'$  Back mounts  '[]'$  fixed  '[]'$  removable  '[]'$  attach back rest/cushion to wheelchair frame   '[]'$  Lateral trunk      supports  '[]'$  R '[]'$  L  '[]'$  decrease lateral trunk leaning  '[]'$  accommodate asymmetry    '[]'$  contour for increased contact  '[]'$  safety    '[]'$  control of tone    '[]'$   Lateral trunk      supports mounts  '[]'$   fixed  '[]'$  swing-away   '[]'$  removable  '[]'$  mounts lateral trunk supports     '[]'$  Mounts lateral trunk supports swing-away or removable for transfers   '[]'$  Anterior chest      strap, vest     '[]'$  decrease forward movement of shoulder  '[]'$  decrease forward movement of trunk  '[]'$  safety/stability  '[]'$  added abdominal support  '[]'$  trunk alignment  '[]'$  assistance with shoulder control   '[]'$  decrease shoulder elevation    '[x]'$  Headrest     Invocare '[x]'$  provide posterior head support  '[x]'$  provide posterior neck support  '[]'$  provide lateral head support  '[]'$  provide anterior head support  '[x]'$  support during tilt and recline  '[]'$  improve feeding     '[]'$  improve respiration  '[]'$  placement of switches  '[x]'$  safety    '[]'$  accommodate ROM   '[]'$  accommodate tone  '[]'$  improve visual orientation   '[x]'$  Headrest           '[]'$  fixed '[x]'$  removable '[]'$  flip down      Mounting hardware   '[]'$  swing-away laterals/switches  '[x]'$  mount headrest   '[x]'$  mounts headrest flip down or  removable for transfers  '[]'$  mount headrest swing-away laterals   '[]'$  mount switches     '[]'$  Neck Support    '[]'$  decrease neck rotation  '[]'$  decrease forward neck flexion   Pelvic Positioner    '[]'$  std hip belt          '[]'$  padded hip belt  '[]'$  dual pull hip belt  '[]'$  four point hip belt  '[]'$  stabilize tone  '[]'$  decrease falling out of chair  '[]'$  prevent excessive extension  '[]'$  special pull angle to control      rotation  '[]'$  pad for protection over boney      prominence  '[]'$  promote comfort    '[]'$  Essential needs        bag/pouch   '[]'$  medicines '[]'$  special food rorthotics '[]'$  clothing changes '[]'$  diapers   '[]'$  catheter/hygiene '[]'$  ostomy supplies   The above equipment has a life- long use expectancy.  Growth and changes in medical and/or functional conditions would be the exceptions.   SUMMARY:  Why mobility device was selected; include why a lower level device is not appropriate:   ASSESSMENT:  CLINICAL IMPRESSION: Patient is a 41 y.o. female who was seen  today for physical therapy for evaluation and assessment for a custom power wheelchair. Cynthia Rosales has a complicated past medical history significant for Sickle Cell Anemia with frequent pain crises, hypertension, Type II diabetes and C1-C4 incomplete quadriparesis. Cynthia Rosales also had a R UE volar surface surgery in 2018 impacting her ability to propel a manual wheelchair. She previously had a custom power wheelchair to allow her to safely and efficiently complete her mobility related ADLs. Her previous custom power wheelchair is not only six years old, but is also broken and not safe to operate. She is not safe, nor is it efficient for her to propel a manual wheelchair due to poor bilateral upper extremity functional strength, impaired sensation and significant increase in her pain related to both her Sickle Cell Anemia and her C1-C4 quadriplegia.  Cynthia Rosales requires the use of a power wheelchair (TDX2 HD base) in order to safely complete her mobility-related ADLs. She is not a safe, nor functional ambulator. She has had at least 3 falls/week (20 falls in  the last 6 months) while ambulator. She fractured her L hip with one of the falls. The fracture is being treated conservatively, so this also limits her ability to safely ambulate. The suspension of the TDX2 HD custom power wheelchair will minimize Cynthia Rosales incidence of painful and functionally-limiting muscle spasms that she experiences due to her C1-C4 quadriplegia. She will require the power tilt feature for her TDX2 HD custom power wheelchair. She experiences frequent and painful muscle spasms. The ability to tilt will allow for repositioning and relieving pressure to reduce the frequency and intensity of these muscle spasms. Due to Cynthia Rosales limited functional and safe mobility, she experiences bilateral lower extremity edema. The ability to tilt in the TDX2 HD power wheelchair will help mediate the edema. In order to safely and fully tilt, Cynthia Rosales  will require power elevating leg rests. With an increase in edema and without the ability to fully tilt with elevated lower extremities, she is at an increased risk for developing blood clots, which contribute to many potentially lethal medical complications, including stroke and pulmonary emboli. Leg rests with foot plates will be necessary to allow for proper support and positioning of her lower extremities throughout all mobility in her wheelchair as well as when she is tilting back to complete her essential pressure relief and minimizing her muscle spasms. She requires heavy duty leg rests/foot plates to safely support her weight. She will also require a headrest that is removable to safely support her head while she is tilting back in order to control her bilateral lower extremity edema, minimize her experience of muscle spasms and provide for full pressure relief. Cynthia Rosales is unable to safely complete pressure relief herself, which significantly increases her risk for developing another pressure injury. 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 She is at a significantly increased risk for falling when standing to complete pressure reliefs due to her decreased functional lower extremity strength and decreased sensation. Cynthia Rosales already experiences multiple falls per week resulting in injuries and so it is necessary for her to limit her time standing/ambulating to reduce her risk for more grave injuries. Due to Cynthia Rosales diagnosis of Type II Diabetes, she is at risk for decreased or slowed healing to any and all injuries that she receives. A joystick control with a swing away mount as well as a battery will be necessary for Cynthia Rosales to be able to operate her TDX2 HD custom power wheelchair. The swing away mount for the controls will allow her to access table tops/various surfaces to be able to complete her  mobility-related ADLs from her wheelchair. In order for her upper extremities to be functionally supported, she will require adjustable height, flip back, full length arm rests. The arm rests will need to flip back to allow for Cynthia Rosales to safely transfer in and out of her wheelchair. Due to her C1-C4 quadriplegia, as well as the pain crises she experiences due to her diagnosis of Sickle Cell Anemia, she requires full upper extremity support that is provided by full length arm rests. It is essential for Cynthia Rosales to have a TruComfort cushion to allow for the most appropriate pressure relief to further limit her risk for developing another pressure injury. As already mentioned, she is unsafe to complete full and effective pressure relief strategies herself. She cannot safely stand as she is at a very high risk for falling and has already  sustained numerous significant injuries from her falls. She is unable to complete wheelchair pushups due to a significant lack of bilateral upper extremity strength caused by her C1-C4 quadriplegia and pain crises caused by her Sickle Cell Anemia. She is unable to complete lateral leaning weight shifts within her chair safely due to poor and inadequate righting reactions and decreased core control/strength due to her diagnosis of quadriplegia. Cynthia Rosales experiences bowel and bladder accidents caused by her quadriplegia and so will require a cover replacement for her TruComfort seat cushion. Without a cover replacement, Cynthia Rosales is at risk for further skin breakdown and maceration sitting on a soiled cushion. In order to safely remain seated in her TDX2 HD custom power wheelchair, she will require a Contour back. This is essential for Cynthia Rosales safety while using her wheelchair to complete her mobility-related ADLs with the greatest level of independence.  Cynthia Rosales previously received a custom power wheelchair that allowed her to safely complete her mobility related ADLs  as well as navigate her environment while limiting her risk for falling and allowing her to perform adequate pressure relief to minimize her risk for developing further medical complications. The need for the TDX2 HD custom power wheelchair is a lifelong need and Cynthia Rosales is willing and able to maneuver the wheelchair independently. Cynthia Rosales previous custom power wheelchair is broken and inoperable. She is a non-functional ambulator, limited to less than household distances of ambulation, due to her profound bilateral lower extremity weakness and decreased sensation. She is unable to propel a manual wheelchair, lightweight or otherwise, due to insufficient bilateral upper extremity strength, poor grip and dexterity as well as a significant increase in her pain with any movement due to both her Sickle Cell Anemia and C1-C4 incomplete quadriparesis. It is essential for Ms. Bethard to receive a TDX2 HD custom power wheelchair to allow her complete her mobility related ADLs with the greatest level of independence as well as safety.   OBJECTIVE IMPAIRMENTS Abnormal gait, decreased activity tolerance, decreased balance, decreased coordination, decreased endurance, decreased mobility, difficulty walking, decreased ROM, decreased strength, hypomobility, increased muscle spasms, impaired sensation, impaired tone, impaired UE functional use, improper body mechanics, postural dysfunction, obesity, and pain.   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   REHAB POTENTIAL: Fair time since onset of injury  CLINICAL DECISION MAKING: Stable/uncomplicated  EVALUATION COMPLEXITY: High   GOALS: One time visit. No goals established. PLAN: PT FREQUENCY: one time visit  Debbora Dus, PT Debbora Dus, PT, DPT, CBIS  05/15/2022, 9:23 AM    I concur with the above findings and recommendations of the therapist:  Physician name printed:         Physician's signature:      Date:

## 2022-05-19 ENCOUNTER — Other Ambulatory Visit: Payer: Self-pay | Admitting: *Deleted

## 2022-05-19 NOTE — Patient Outreach (Signed)
[facility]  05/19/2022  Cynthia Rosales 01/14/1981 [id number]   Patient has met goals of care. Her blood pressure is better controlled. Patient is taking medications as per ordered.  Plan: Letter sent to PCP Letter sent to Patient Certificate of completion sent to patient     BSN RN [facility] [phone]  

## 2022-05-20 ENCOUNTER — Ambulatory Visit: Payer: Self-pay | Admitting: *Deleted

## 2022-06-24 ENCOUNTER — Ambulatory Visit: Payer: Medicare Other | Admitting: Physical Therapy

## 2022-06-24 ENCOUNTER — Ambulatory Visit: Payer: Medicare Other | Attending: Primary Care | Admitting: Physical Therapy

## 2022-06-24 DIAGNOSIS — Z9181 History of falling: Secondary | ICD-10-CM | POA: Diagnosis present

## 2022-06-24 DIAGNOSIS — R2681 Unsteadiness on feet: Secondary | ICD-10-CM

## 2022-06-24 DIAGNOSIS — R262 Difficulty in walking, not elsewhere classified: Secondary | ICD-10-CM | POA: Diagnosis present

## 2022-06-24 DIAGNOSIS — M6281 Muscle weakness (generalized): Secondary | ICD-10-CM

## 2022-06-24 NOTE — Therapy (Addendum)
OUTPATIENT PHYSICAL THERAPY NEURO EVALUATION   Patient Name: Cynthia Rosales MRN: [id number] DOB:Jan 13, 1981, 41 y.o., female 19 Date: 06/24/2022   PCP: Elwyn Reach, MD  REFERRING PROVIDER: Elwyn Reach, MD    PT End of Session - 06/24/22 1452     Visit Number 1    Number of Visits 13   Plus eval   Date for PT Re-Evaluation 08/12/22    Authorization Type UHC Medicare/Medicaid of [place]    PT Start Time 5956    PT Stop Time 1526    PT Time Calculation (min) 39 min    Activity Tolerance Patient tolerated treatment well    Behavior During Therapy WFL for tasks assessed/performed             Past Medical History:  Diagnosis Date   Abscess of bursa, left elbow 06/2013   Treated with I and D/antibiotics.    Anemia    Arthritis    Asthma    Depression    History of blood transfusion    "after I had one of my kids"   History of kidney stones    Hypertension    Ovarian cyst    Schizophrenia ([place])    Sickle cell anemia (HCC)    Type II diabetes mellitus ([place])    Past Surgical History:  Procedure Laterality Date   APPENDECTOMY  2013   BILIARY STENT PLACEMENT N/A 05/02/2018   Procedure: BILIARY STENT PLACEMENT;  Surgeon: Doran Stabler, MD;  Location: [facility];  Service: Gastroenterology;  Laterality: N/A;   BOWEL RESECTION  12/03/2020   Procedure: SMALL BOWEL RESECTION;  Surgeon: Kieth Brightly, Arta Bruce, MD;  Location: [facility];  Service: General;;   CARPAL TUNNEL RELEASE     CESAREAN SECTION  2000; 2007; 2011   CHOLECYSTECTOMY N/A 04/03/2018   Procedure: LAPAROSCOPIC CHOLECYSTECTOMY;  Surgeon: Rolm Bookbinder, MD;  Location: [facility];  Service: General;  Laterality: N/A;   [place] N/A 10/22/2020   Procedure: DILATATION & CURETTAGE/HYSTEROSCOPY WITH MYOSURE;  Surgeon: Cheri Fowler, MD;  Location: [facility];  Service: Gynecology;  Laterality: N/A;   DILATION AND CURETTAGE OF UTERUS     ERCP N/A 05/02/2018   Procedure:  ENDOSCOPIC RETROGRADE CHOLANGIOPANCREATOGRAPHY (ERCP);  Surgeon: Doran Stabler, MD;  Location: [facility];  Service: Gastroenterology;  Laterality: N/A;   ERCP N/A 07/26/2018   Procedure: ENDOSCOPIC RETROGRADE CHOLANGIOPANCREATOGRAPHY (ERCP);  Surgeon: Irene Shipper, MD;  Location: [facility];  Service: Endoscopy;  Laterality: N/A;  stent removal   IR RADIOLOGIST EVAL & MGMT  05/19/2018   LAMINECTOMY N/A 12/13/2014   Procedure: CERVICAL LAMINECTOMY FOR INTRADURAL TUMOR;  Surgeon: Charlie Pitter, MD;  Location: [facility];  Service: Neurosurgery;  Laterality: N/A;  posterior   LAPAROSCOPY N/A 07/20/2017   Procedure: LAPAROSCOPY OPERATIVE WITH WEDGE RESECTION RIGHT CORNUA AND PARTIAL SALPINGECTOMY;  Surgeon: [name], MD;  Location: [facility];  Service: Gynecology;  Laterality: N/A;   REDUCTION MAMMAPLASTY Bilateral 1998   REMOVAL OF STONES  05/02/2018   Procedure: REMOVAL OF STONES;  Surgeon: Doran Stabler, MD;  Location: [facility];  Service: Gastroenterology;;   [name]  05/02/2018   Procedure: LOVFIEPPIRJJOA;  Surgeon: Doran Stabler, MD;  [facility];  Service: Gastroenterology;;   Lavell Islam REMOVAL  07/26/2018   Procedure: STENT REMOVAL;  Surgeon: Irene Shipper, MD;  [facility];  Service: Endoscopy;;   TOTAL LAPAROSCOPIC HYSTERECTOMY WITH SALPINGECTOMY Bilateral 12/03/2020  Procedure: ATTEMPTED  LAPAROSCOPIC, OPEN ABDOMINAL HYSTERECTOMY WITH BILATERAL SALPINGO-OOPHERECTOMY AND LYSIS OF ADHESIONS;  Surgeon: Cheri Fowler, MD;  Location: [facility];  Service: Gynecology;  Laterality: Bilateral;   Patient Active Problem List   Diagnosis Date Noted   Uterine leiomyoma 12/03/2020   S/P abdominal hysterectomy 12/03/2020   Bile leak, postoperative    Choledocholithiasis    Intra-abdominal fluid collection    Anemia 04/28/2018   Morbid obesity with body mass index (BMI) of 50.0 to 59.9 in adult [facility]) 04/02/2018   Mass of uterus 04/02/2018   Transaminitis  04/02/2018   Acute cholecystitis s/p lap cholecystectomy 04/03/2018 04/01/2018   Pregnancy, ectopic, cornual or cervical 07/21/2017   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   Neurogenic bladder 08/23/2015   Bilateral carpal tunnel syndrome 08/23/2015   Paraparesis of both lower limbs ([place]) 07/14/2015   Ileus, postoperative ([place]) 12/25/2014   Diabetes mellitus type 2 in obese ([place]) 12/25/2014   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[place]) 12/24/2014   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[place]) 12/24/2014   Ileus of unspecified type ([place])    Atelectasis    Bowel obstruction ([place])    Encounter for central line care    Ileus ([place])    Intestinal occlusion ([place])    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   Headache 11/19/2014   Neck pain 11/19/2014   Right sided weakness 11/19/2014   Paresthesias 11/19/2014   Nightmares 09/11/2014   HTN (hypertension), benign 03/28/2014   Nephrolithiasis 03/27/2014   Diabetes mellitus ([place]) 03/27/2014   Abdominal pain 03/27/2014    ONSET DATE: 06/16/2022   REFERRING DIAG: R53.1 weakness of lower extremities, Z74.09 reduced mobility   THERAPY DIAG:  Muscle weakness (generalized)  Difficulty in walking, not elsewhere classified  Unsteadiness on feet  History of falling  Rationale for Evaluation and Treatment Rehabilitation  SUBJECTIVE:                                                                                                                                                                                              SUBJECTIVE STATEMENT: "I needed to come back to PT. After Covid I started to see the weight gain even though I have been working out. I am limited by significant pain and have been trying to do what the doctors ask me to do but it isn't enough. I think I am overworking myself and making myself have more pain". Pt reports her main concern is weight loss.   Pt accompanied by: self  PERTINENT HISTORY: Sickle Cell Anemia with frequent pain  crises, hypertension, Type II diabetes and C1-C4 incomplete quadriparesis  PAIN:  Are you having pain? Yes: NPRS scale: 10/10 Pain location: entire body Pain description: Achy/sharp  PRECAUTIONS: Fall  WEIGHT BEARING RESTRICTIONS No  FALLS: Has patient fallen in last 6 months? Yes. Number of falls at least 20, reports she always fall to L side  LIVING ENVIRONMENT: Lives with: lives with their family and lives with their partner Lives in: House/apartment Stairs: Yes: Internal: full flight steps; can reach both Has following equipment at home: Quad cane large base, Environmental consultant - 4 wheeled, Wheelchair (power), Grab bars, and Ramped entry  PLOF: Requires assistive device for independence, Needs assistance with ADLs, Needs assistance with homemaking, Needs assistance with gait, and has home aide that comes 7 days/week   PATIENT GOALS "To get my strength back"   OBJECTIVE:   COGNITION: Overall cognitive status: Within functional limits for tasks assessed   SENSATION: Impaired 2/2 incomplete quad at C1-C4   COORDINATION: Heel to shin test: unable to perform bilaterally 2/2 body habitus, pain and weakness. Able to bring LE to midline on both sides    POSTURE: rounded shoulders, forward head, and posterior pelvic tilt   LOWER EXTREMITY MMT:  Tested in seated position   MMT Right Eval Left Eval  Hip flexion 2- 2-  Hip extension    Hip abduction 2+ 2+  Hip adduction 2 2  Hip internal rotation    Hip external rotation    Knee flexion 4- 4-  Knee extension 3+ 3+  Ankle dorsiflexion 3+ 3+  Ankle plantarflexion    Ankle inversion    Ankle eversion    (Blank rows = not tested)  BED MOBILITY:  Pt reports difficulty w/rolling due to body habitus and pain. States her bf assists w/rolling and swinging legs into/out of bed   TRANSFERS: Assistive device utilized: None  Sit to stand: Modified independence Stand to sit: Modified independence   GAIT: Gait pattern: step through  pattern, decreased arm swing- Right, decreased arm swing- Left, decreased step length- Left, decreased stance time- Right, decreased stride length, decreased hip/knee flexion- Right, decreased hip/knee flexion- Left, decreased ankle dorsiflexion- Right, decreased ankle dorsiflexion- Left, Right foot flat, Left foot flat, antalgic, lateral hip instability, lateral lean- Right, decreased trunk rotation, wide BOS, poor foot clearance- Right, and poor foot clearance- Left Distance walked: Various clinic distances  Assistive device utilized: None Level of assistance: SBA Comments: Pt with decreased cadence and antalgic gait pattern   FUNCTIONAL TESTs:   [facility] PT Assessment - 06/24/22 1516       Transfers   Five time sit to stand comments  34.84s w/BUE support, heavy bracing against chair      Ambulation/Gait   Gait velocity 32.8' over 33.12s = 0.99 ft/s without AD             TODAY'S TREATMENT:  Next session    PATIENT EDUCATION: Education details: Eval findings, POC, education on joining [facility] for continued functional strength gains and contacting dietician for nutrition needs; clinic's cancellation/no-show policy  Person educated: Patient Education method: Customer service manager Education comprehension: verbalized understanding and needs further education   HOME EXERCISE PROGRAM: To be established next session     GOALS: Goals reviewed with patient? Yes  SHORT TERM GOALS: Target date: 07/15/2022  Pt will be independent with initial HEP for improved strength, balance, transfers and gait.  Baseline: not established on eval Goal status: INITIAL  2.  Pt will improve gait velocity to at least 1.3 ft/s w/LRAD for improved gait efficiency and safety.  Baseline: 0.99 ft/s without AD Goal status: INITIAL   LONG TERM GOALS: Target date: 08/05/2022  Pt will verbalize plan to continue fitness on land and in pool post-DC for continued functional strength gains.    Baseline:  Goal status: INITIAL  2.  Pt will improve gait velocity to at least 1.9 ft/s w/LRAD for improved gait efficiency and performance and reduced fall risk   Baseline: 0.99 ft/s without AD Goal status: INITIAL  3.  Pt will improve 5 x STS to less than or equal to 29 seconds w/BUE support to demonstrate improved functional strength and transfer efficiency.   Baseline: 34.84s w/BUE support  Goal status: INITIAL  ASSESSMENT:  CLINICAL IMPRESSION: Patient is a 41 year old female referred to Neuro OPPT for BLE weakness. Pt's PMH is significant for: Sickle Cell Anemia with frequent pain crises, hypertension, Type II diabetes and C1-C4 incomplete quadriparesis. The following deficits were present during the exam: decreased strength, decreased activity tolerance, and decreased safety awareness. Based on gait speed, 5x STS and fall history, pt is an incr risk for falls. Pt would benefit from skilled PT to address these impairments and functional limitations to maximize functional mobility independence.    OBJECTIVE IMPAIRMENTS Abnormal gait, cardiopulmonary status limiting activity, decreased activity tolerance, decreased endurance, decreased mobility, difficulty walking, decreased strength, decreased safety awareness, impaired sensation, improper body mechanics, and pain.   ACTIVITY LIMITATIONS carrying, lifting, bending, standing, squatting, stairs, transfers, bed mobility, continence, bathing, toileting, dressing, reach over head, hygiene/grooming, locomotion level, and caring for others  PARTICIPATION LIMITATIONS: meal prep, cleaning, laundry, medication management, personal finances, interpersonal relationship, driving, shopping, community activity, and yard work  [place], Past/current experiences, Time since onset of injury/illness/exacerbation, Transportation, and 1 comorbidity: incomplete C1-C4 quadriplegia  are also affecting patient's functional outcome.   REHAB  POTENTIAL: Fair due to history of SCI, body habitus and pain  CLINICAL DECISION MAKING: Stable/uncomplicated  EVALUATION COMPLEXITY: Low  PLAN: PT FREQUENCY: 2x/week (split between land/aquatic)  PT DURATION: 6 weeks  PLANNED INTERVENTIONS: Therapeutic exercises, Therapeutic activity, Neuromuscular re-education, Balance training, Gait training, Patient/Family education, Self Care, Stair training, DME instructions, Aquatic Therapy, Wheelchair mobility training, Manual therapy, and Re-evaluation  PLAN FOR NEXT SESSION: Establish HEP for strength, scifit for endurance, stairs    Malvin Morrish E Jeanmarie Mccowen, PT, DPT 06/24/2022, 3:31 PM

## 2022-07-06 ENCOUNTER — Ambulatory Visit: Payer: Medicare Other | Attending: Primary Care | Admitting: Physical Therapy

## 2022-07-06 DIAGNOSIS — R262 Difficulty in walking, not elsewhere classified: Secondary | ICD-10-CM | POA: Insufficient documentation

## 2022-07-06 DIAGNOSIS — R2681 Unsteadiness on feet: Secondary | ICD-10-CM | POA: Insufficient documentation

## 2022-07-06 DIAGNOSIS — M6281 Muscle weakness (generalized): Secondary | ICD-10-CM | POA: Insufficient documentation

## 2022-07-08 ENCOUNTER — Inpatient Hospital Stay (HOSPITAL_COMMUNITY)
Admission: EM | Admit: 2022-07-08 | Discharge: 2022-07-14 | DRG: 683 | Disposition: A | Discharge status: Home / Self Care | Payer: Medicare Other | Attending: Family Medicine | Admitting: Family Medicine

## 2022-07-08 ENCOUNTER — Emergency Department (HOSPITAL_COMMUNITY): Payer: Medicare Other

## 2022-07-08 ENCOUNTER — Encounter (HOSPITAL_COMMUNITY): Payer: Self-pay

## 2022-07-08 ENCOUNTER — Ambulatory Visit: Payer: Medicare Other | Admitting: Physical Therapy

## 2022-07-08 DIAGNOSIS — N179 Acute kidney failure, unspecified: Principal | ICD-10-CM

## 2022-07-08 DIAGNOSIS — E872 Acidosis, unspecified: Secondary | ICD-10-CM | POA: Diagnosis present

## 2022-07-08 DIAGNOSIS — M47816 Spondylosis without myelopathy or radiculopathy, lumbar region: Secondary | ICD-10-CM | POA: Diagnosis present

## 2022-07-08 DIAGNOSIS — E66813 Obesity, class 3: Secondary | ICD-10-CM

## 2022-07-08 DIAGNOSIS — R2681 Unsteadiness on feet: Secondary | ICD-10-CM

## 2022-07-08 DIAGNOSIS — Z87891 Personal history of nicotine dependence: Secondary | ICD-10-CM

## 2022-07-08 DIAGNOSIS — Z8249 Family history of ischemic heart disease and other diseases of the circulatory system: Secondary | ICD-10-CM

## 2022-07-08 DIAGNOSIS — D5700 Hb-SS disease with crisis, unspecified: Secondary | ICD-10-CM

## 2022-07-08 DIAGNOSIS — E119 Type 2 diabetes mellitus without complications: Secondary | ICD-10-CM

## 2022-07-08 DIAGNOSIS — M5127 Other intervertebral disc displacement, lumbosacral region: Secondary | ICD-10-CM | POA: Diagnosis present

## 2022-07-08 DIAGNOSIS — J45909 Unspecified asthma, uncomplicated: Secondary | ICD-10-CM | POA: Diagnosis present

## 2022-07-08 DIAGNOSIS — Z794 Long term (current) use of insulin: Secondary | ICD-10-CM

## 2022-07-08 DIAGNOSIS — M6281 Muscle weakness (generalized): Secondary | ICD-10-CM

## 2022-07-08 DIAGNOSIS — E860 Dehydration: Secondary | ICD-10-CM | POA: Diagnosis not present

## 2022-07-08 DIAGNOSIS — E1142 Type 2 diabetes mellitus with diabetic polyneuropathy: Secondary | ICD-10-CM | POA: Diagnosis present

## 2022-07-08 DIAGNOSIS — M545 Low back pain, unspecified: Secondary | ICD-10-CM | POA: Diagnosis present

## 2022-07-08 DIAGNOSIS — I10 Essential (primary) hypertension: Secondary | ICD-10-CM | POA: Diagnosis present

## 2022-07-08 DIAGNOSIS — D72829 Elevated white blood cell count, unspecified: Secondary | ICD-10-CM | POA: Diagnosis present

## 2022-07-08 DIAGNOSIS — G894 Chronic pain syndrome: Secondary | ICD-10-CM | POA: Diagnosis present

## 2022-07-08 DIAGNOSIS — E876 Hypokalemia: Secondary | ICD-10-CM

## 2022-07-08 DIAGNOSIS — N3000 Acute cystitis without hematuria: Secondary | ICD-10-CM

## 2022-07-08 DIAGNOSIS — Z6841 Body Mass Index (BMI) 40.0 and over, adult: Secondary | ICD-10-CM

## 2022-07-08 DIAGNOSIS — R262 Difficulty in walking, not elsewhere classified: Secondary | ICD-10-CM

## 2022-07-08 DIAGNOSIS — Z833 Family history of diabetes mellitus: Secondary | ICD-10-CM

## 2022-07-08 DIAGNOSIS — F209 Schizophrenia, unspecified: Secondary | ICD-10-CM | POA: Diagnosis present

## 2022-07-08 DIAGNOSIS — Z803 Family history of malignant neoplasm of breast: Secondary | ICD-10-CM

## 2022-07-08 DIAGNOSIS — N390 Urinary tract infection, site not specified: Secondary | ICD-10-CM

## 2022-07-08 DIAGNOSIS — Z8041 Family history of malignant neoplasm of ovary: Secondary | ICD-10-CM

## 2022-07-08 DIAGNOSIS — Z20822 Contact with and (suspected) exposure to covid-19: Secondary | ICD-10-CM | POA: Diagnosis present

## 2022-07-08 HISTORY — DX: Anemia, unspecified: D64.9

## 2022-07-08 LAB — LACTIC ACID, PLASMA: Lactic Acid, Venous: 2 mmol/L (ref 0.5–1.9)

## 2022-07-08 LAB — CBC WITH DIFFERENTIAL/PLATELET
Abs Immature Granulocytes: 0.09 10*3/uL — ABNORMAL HIGH (ref 0.00–0.07)
Basophils Absolute: 0.1 10*3/uL (ref 0.0–0.1)
Basophils Relative: 0 %
Eosinophils Absolute: 0.1 10*3/uL (ref 0.0–0.5)
Eosinophils Relative: 0 %
HCT: 41.1 % (ref 36.0–46.0)
Hemoglobin: 13 g/dL (ref 12.0–15.0)
Immature Granulocytes: 1 %
Lymphocytes Relative: 28 %
Lymphs Abs: 4.5 10*3/uL — ABNORMAL HIGH (ref 0.7–4.0)
MCH: 22.8 pg — ABNORMAL LOW (ref 26.0–34.0)
MCHC: 31.6 g/dL (ref 30.0–36.0)
MCV: 72.2 fL — ABNORMAL LOW (ref 80.0–100.0)
Monocytes Absolute: 0.8 10*3/uL (ref 0.1–1.0)
Monocytes Relative: 5 %
Neutro Abs: 10.5 10*3/uL — ABNORMAL HIGH (ref 1.7–7.7)
Neutrophils Relative %: 66 %
Platelets: 402 10*3/uL — ABNORMAL HIGH (ref 150–400)
RBC: 5.69 MIL/uL — ABNORMAL HIGH (ref 3.87–5.11)
RDW: 18.8 % — ABNORMAL HIGH (ref 11.5–15.5)
WBC: 16.1 10*3/uL — ABNORMAL HIGH (ref 4.0–10.5)
nRBC: 0 % (ref 0.0–0.2)

## 2022-07-08 LAB — COMPREHENSIVE METABOLIC PANEL
ALT: 16 U/L (ref 0–44)
AST: 17 U/L (ref 15–41)
Albumin: 3.8 g/dL (ref 3.5–5.0)
Alkaline Phosphatase: 86 U/L (ref 38–126)
Anion gap: 14 (ref 5–15)
BUN: 27 mg/dL — ABNORMAL HIGH (ref 6–20)
CO2: 28 mmol/L (ref 22–32)
Calcium: 9.4 mg/dL (ref 8.9–10.3)
Chloride: 99 mmol/L (ref 98–111)
Creatinine, Ser: 1.42 mg/dL — ABNORMAL HIGH (ref 0.44–1.00)
GFR, Estimated: 47 mL/min — ABNORMAL LOW (ref >60)
Glucose, Bld: 104 mg/dL — ABNORMAL HIGH (ref 70–99)
Potassium: 3.6 mmol/L (ref 3.5–5.1)
Sodium: 141 mmol/L (ref 135–145)
Total Bilirubin: 0.5 mg/dL (ref 0.3–1.2)
Total Protein: 9.1 g/dL — ABNORMAL HIGH (ref 6.5–8.1)

## 2022-07-08 LAB — RETICULOCYTES
Immature Retic Fract: 14 % (ref 2.3–15.9)
RBC.: 5.76 MIL/uL — ABNORMAL HIGH (ref 3.87–5.11)
Retic Count, Absolute: 66.8 10*3/uL (ref 19.0–186.0)
Retic Ct Pct: 1.2 % (ref 0.4–3.1)

## 2022-07-08 MED ORDER — KETOROLAC TROMETHAMINE 15 MG/ML IJ SOLN
15.0000 mg | INTRAMUSCULAR | Status: AC
  Administered 2022-07-08: 15 mg via INTRAVENOUS
  Filled 2022-07-08: qty 1

## 2022-07-08 MED ORDER — ONDANSETRON HCL 4 MG/2ML IJ SOLN
4.0000 mg | INTRAMUSCULAR | Status: DC | PRN
  Administered 2022-07-09 (×2): 4 mg via INTRAVENOUS
  Filled 2022-07-08 (×2): qty 2

## 2022-07-08 MED ORDER — SODIUM CHLORIDE 0.9 % IV SOLN
12.5000 mg | Freq: Once | INTRAVENOUS | Status: AC
  Administered 2022-07-08: 12.5 mg via INTRAVENOUS
  Filled 2022-07-08: qty 12.5

## 2022-07-08 MED ORDER — HYDROMORPHONE HCL 2 MG/ML IJ SOLN
2.0000 mg | INTRAMUSCULAR | Status: AC
  Administered 2022-07-08: 2 mg via INTRAVENOUS
  Filled 2022-07-08: qty 1

## 2022-07-08 MED ORDER — HYDROMORPHONE HCL 2 MG/ML IJ SOLN
2.0000 mg | INTRAMUSCULAR | Status: AC
  Administered 2022-07-08: 2 mg via INTRAVENOUS

## 2022-07-08 MED ORDER — SODIUM CHLORIDE 0.45 % IV SOLN: INTRAVENOUS | Status: DC

## 2022-07-08 MED ORDER — HYDROMORPHONE HCL 2 MG/ML IJ SOLN
INTRAMUSCULAR | Status: AC
  Filled 2022-07-08: qty 1

## 2022-07-08 NOTE — ED Provider Notes (Signed)
[facility] DEPT Provider Note   CSN: [id number] Arrival date & time: 07/08/22  1757     History  Chief Complaint  Patient presents with   Sickle Cell Pain [place] is a 41 y.o. female.  Pt is a 41 yo female with a pmhx significant for DM, Sickle Cell Disease, HTN, Depression, and schizophrenia.  Pt said she has been hurting all week.  She said she had a fever earlier today.  Pt said she feels the worse she's felt in 10 years.  Pt said she hurts everywhere.  No cough.  No known sick exposures.         Home Medications Prior to Admission medications   Medication Sig Start Date End Date Taking? Authorizing Provider  diazepam (VALIUM) 5 MG tablet Take 5 mg by mouth 2 (two) times daily as needed for muscle spasms.   Yes [provider]  diphenoxylate-atropine (LOMOTIL) 2.5-0.025 MG tablet Take 1-2 tablets by mouth 4 (four) times daily as needed for diarrhea or loose stools. 01/09/22  Yes Pollina, Gwenyth Allegra, MD  estradiol (CLIMARA - DOSED IN MG/24 HR) 0.075 mg/24hr patch Place 0.075 mg onto the skin once a week. 06/12/22  Yes [provider]  furosemide (LASIX) 40 MG tablet Take 40 mg by mouth daily. 04/15/22  Yes [provider]  HUMALOG MIX 75/25 KWIKPEN (75-25) 100 UNIT/ML KwikPen Inject 36 Units into the skin in the morning, at noon, and at bedtime. 04/07/22  Yes [provider]  hydrochlorothiazide (HYDRODIURIL) 25 MG tablet Take 25 mg by mouth daily.  04/27/18  Yes [provider]  ibuprofen (ADVIL) 800 MG tablet Take 800 mg by mouth 3 (three) times daily. 06/13/22  Yes [provider]  oxycodone (ROXICODONE) 30 MG immediate release tablet Take 1 tablet (30 mg total) by mouth every 6 (six) hours as needed for pain. Patient taking differently: Take 45 mg by mouth every 4 (four) hours as needed for pain. 06/01/20  Yes Dorena Dew, FNP  OZEMPIC, 0.25 OR 0.5 MG/DOSE, 2 MG/3ML SOPN  Inject 0.25 mg into the skin once a week. 06/17/22  Yes [provider]  potassium chloride (MICRO-K) 10 MEQ CR capsule Take 10 mEq by mouth daily. 05/18/22  Yes [provider]  sitaGLIPtin (JANUVIA) 100 MG tablet Take 100 mg by mouth daily.   Yes [provider]  benzonatate (TESSALON) 200 MG capsule Take 1 capsule (200 mg total) by mouth 3 (three) times daily as needed for cough. Patient not taking: Reported on 07/08/2022 01/09/22   Orpah Greek, MD  lidocaine (XYLOCAINE) 2 % solution Use as directed 15 mLs in the mouth or throat as needed for mouth pain. Patient not taking: Reported on 07/08/2022 01/09/22   Orpah Greek, MD  metoCLOPramide (REGLAN) 10 MG tablet Take 1 tablet (10 mg total) by mouth every 6 (six) hours. Patient not taking: Reported on 07/08/2022 07/30/21   Fatima Blank, MD  ondansetron (ZOFRAN) 4 MG tablet Take 1 tablet (4 mg total) by mouth every 6 (six) hours as needed for nausea or vomiting. Patient not taking: Reported on 07/08/2022 01/09/22   Orpah Greek, MD  ondansetron (ZOFRAN-ODT) 4 MG disintegrating tablet Take 1 tablet (4 mg total) by mouth every 8 (eight) hours as needed for nausea or vomiting. Patient not taking: Reported on 07/08/2022 07/10/21   Arnaldo Natal, MD      Allergies    Amoxicillin, Buprenorphine  hcl, and Morphine and related    Review of Systems   Review of Systems  Constitutional:  Positive for fever.  Musculoskeletal:        Pain everywhere  All other systems reviewed and are negative.   Physical Exam Updated Vital Signs BP (!) 149/89   Pulse 96   Temp 97.8 F (36.6 C) (Oral)   Resp (!) 27   LMP 03/21/2020   SpO2 100%  Physical Exam Vitals and nursing note reviewed.  Constitutional:      Appearance: Normal appearance. She is obese. She is ill-appearing.  HENT:     Head: Normocephalic and atraumatic.     Right Ear: External ear normal.     Left Ear: External ear normal.      Nose: Nose normal.     Mouth/Throat:     Mouth: Mucous membranes are dry.  Eyes:     Extraocular Movements: Extraocular movements intact.     Conjunctiva/sclera: Conjunctivae normal.     Pupils: Pupils are equal, round, and reactive to light.  Cardiovascular:     Rate and Rhythm: Regular rhythm. Tachycardia present.     Pulses: Normal pulses.     Heart sounds: Normal heart sounds.  Pulmonary:     Effort: Pulmonary effort is normal.     Breath sounds: Normal breath sounds.  Abdominal:     General: Abdomen is flat. Bowel sounds are normal.     Palpations: Abdomen is soft.  Musculoskeletal:        General: Normal range of motion.     Cervical back: Normal range of motion and neck supple.  Skin:    General: Skin is warm.     Capillary Refill: Capillary refill takes less than 2 seconds.  Neurological:     General: No focal deficit present.     Mental Status: She is alert and oriented to person, place, and time.  Psychiatric:        Mood and Affect: Mood normal.        Behavior: Behavior normal.     ED Results / Procedures / Treatments   Labs (all labs ordered are listed, but only abnormal results are displayed) Labs Reviewed  COMPREHENSIVE METABOLIC PANEL - Abnormal; Notable for the following components:      Result Value   Glucose, Bld 104 (*)    BUN 27 (*)    Creatinine, Ser 1.42 (*)    Total Protein 9.1 (*)    GFR, Estimated 47 (*)    All other components within normal limits  CBC WITH DIFFERENTIAL/PLATELET - Abnormal; Notable for the following components:   WBC 16.1 (*)    RBC 5.69 (*)    MCV 72.2 (*)    MCH 22.8 (*)    RDW 18.8 (*)    Platelets 402 (*)    Neutro Abs 10.5 (*)    Lymphs Abs 4.5 (*)    Abs Immature Granulocytes 0.09 (*)    All other components within normal limits  RETICULOCYTES - Abnormal; Notable for the following components:   RBC. 5.76 (*)    All other components within normal limits  LACTIC ACID, PLASMA - Abnormal; Notable for the following  components:   Lactic Acid, Venous 2.0 (*)    All other components within normal limits  URINE CULTURE  SARS CORONAVIRUS 2 BY RT PCR  LACTIC ACID, PLASMA  URINALYSIS, ROUTINE W REFLEX MICROSCOPIC  CBG MONITORING, ED  TROPONIN I (HIGH SENSITIVITY)    EKG EKG  Interpretation  Date/Time:  Wednesday July 08 2022 19:22:09 EDT Ventricular Rate:  103 PR Interval:  157 QRS Duration: 82 QT Interval:  325 QTC Calculation: 426 R Axis:   82 Text Interpretation: Sinus tachycardia Probable left atrial enlargement Borderline T abnormalities, anterior leads Since last tracing rate faster Confirmed by Isla Pence [phone]) on 07/08/2022 9:31:09 PM  Radiology DG Chest 2 View  Result Date: 07/08/2022 CLINICAL DATA:  Chest pain EXAM: CHEST - 2 VIEW COMPARISON:  01/09/2022 FINDINGS: The heart size and mediastinal contours are within normal limits. Both lungs are clear. The visualized skeletal structures are unremarkable. IMPRESSION: No active cardiopulmonary disease. Electronically Signed   By: Rolm Baptise M.D.   On: 07/08/2022 19:15    Procedures Procedures    Medications Ordered in ED Medications  0.45 % sodium chloride infusion ( Intravenous New Bag/Given 07/08/22 2140)  ondansetron (ZOFRAN) injection 4 mg (has no administration in time range)  HYDROmorphone (DILAUDID) 2 MG/ML injection (  Not Given 07/08/22 2328)  ketorolac (TORADOL) 15 MG/ML injection 15 mg (15 mg Intravenous Given 07/08/22 2129)  diphenhydrAMINE (BENADRYL) 12.5 mg in sodium chloride 0.9 % 50 mL IVPB (0 mg Intravenous Stopped 07/08/22 2250)  HYDROmorphone (DILAUDID) injection 2 mg (2 mg Intravenous Given 07/08/22 2132)  HYDROmorphone (DILAUDID) injection 2 mg (2 mg Intravenous Given 07/08/22 2326)  HYDROmorphone (DILAUDID) injection 2 mg (2 mg Intravenous Given 07/08/22 2210)    ED Course/ Medical Decision Making/ A&P                           Medical Decision Making Amount and/or Complexity of Data Reviewed Labs:  ordered.  Risk Prescription drug management.   This patient presents to the ED for concern of sickle cell crisis, this involves an extensive number of treatment options, and is a complaint that carries with it a high risk of complications and morbidity.  The differential diagnosis includes sickle cell crisis, infection   Co morbidities that complicate the patient evaluation  DM, Sickle Cell Disease, HTN, Depression, and schizophrenia.   Additional history obtained:  Additional history obtained from epic chart review   Lab Tests:  I Ordered, and personally interpreted labs.  The pertinent results include:  cbc with wbc elevated at 16; cmp with cr 1.42 (elevated); ua and covid pending   Imaging Studies ordered:  I ordered imaging studies including cxr  I independently visualized and interpreted imaging which showed  IMPRESSION:  No active cardiopulmonary disease.   I agree with the radiologist interpretation   Cardiac Monitoring:  The patient was maintained on a cardiac monitor.  I personally viewed and interpreted the cardiac monitored which showed an underlying rhythm of: nsr   Medicines ordered and prescription drug management:  I ordered medication including dilaudid  for pain  Reevaluation of the patient after these medicines showed that the patient improved I have reviewed the patients home medicines and have made adjustments as needed    Critical Interventions:  Pain control   Problem List / ED Course:  Sickle cell pain crisis:  pain is improving ?fever at home:  ua and covid pending Mild aki:  pt given ivfs   Reevaluation:  After the interventions noted above, I reevaluated the patient and found that they have :improved   Social Determinants of Health:  Lives at home   Dispostion:  Pending.  Pt signed out to Dr. Randal Buba at shift change.       Final Clinical  Impression(s) / ED Diagnoses Final diagnoses:  Sickle cell pain crisis  ([facility])  AKI (acute kidney injury) ([place])  Dehydration    Rx / DC Orders ED Discharge Orders     None         Isla Pence, MD 07/08/22 2358

## 2022-07-08 NOTE — ED Provider Triage Note (Signed)
Emergency Medicine Provider Triage Evaluation Note  Cynthia Rosales , a 41 y.o. female  was evaluated in triage.  Pt complains of sickle cell pain crisis.  Patient reports that she has had pain all over for the past 3 weeks but is gotten progressively worse over the past 2 to 3 days.  She states her usual sickle cell pain involves her lower back.  She tried her at home for oxycodone with minimal to no relief.  She was sent here by her PCP for further evaluation of her symptoms.  She is currently complaining of chest pain, shortness of breath, abdominal pain, nausea, bilateral upper extremity pain as well as back pain.  Denies any known trauma or mechanism of injury.  Denies fever, chills, night sweats, vomiting, diarrhea, vaginal/urinary symptoms.  Review of Systems  Positive: See above Negative:   Physical Exam  BP (!) 148/87   Pulse (!) 103   Temp 98.7 F (37.1 C) (Oral)   LMP 03/21/2020   SpO2 100%  Gen:   Awake, no distress   Resp:  Normal effort  MSK:   Moves extremities without difficulty  Other:  Diffusely tender to palpation of abdomen.  Tenderness to palpation of anterior chest wall.  Medical Decision Making  Medically screening exam initiated at 6:54 PM.  Appropriate orders placed.  Cynthia Rosales was informed that the remainder of the evaluation will be completed by another provider, this initial triage assessment does not replace that evaluation, and the importance of remaining in the ED until their evaluation is complete.     Wilnette Kales, [place] 07/08/22 [phone]

## 2022-07-08 NOTE — Therapy (Signed)
OUTPATIENT PHYSICAL THERAPY NEURO TREATMENT - ARRIVED NO CHARGE   Patient Name: Cynthia Rosales MRN: [id number] DOB:06-24-1981, 41 y.o., female Today's Date: 07/08/2022   PCP: Elwyn Reach, MD  REFERRING PROVIDER: Elwyn Reach, MD    PT End of Session - 07/08/22 1404     Visit Number 1   Arrived no charge   Number of Visits 13   Plus eval   Date for PT Re-Evaluation 08/12/22    Authorization Type UHC Medicare/Medicaid of [place]    PT Start Time 4128    PT Stop Time 1412    PT Time Calculation (min) 10 min    Activity Tolerance Patient limited by pain;Treatment limited secondary to medical complications (Comment)   Pt states she is in a "sickle cell crisis"   Behavior During Therapy [facility] for tasks assessed/performed             Past Medical History:  Diagnosis Date   Abscess of bursa, left elbow 06/2013   Treated with I and D/antibiotics.    Anemia    Arthritis    Asthma    Depression    History of blood transfusion    "after I had one of my kids"   History of kidney stones    Hypertension    Ovarian cyst    Schizophrenia ([place])    Sickle cell anemia (HCC)    Type II diabetes mellitus ([place])    Past Surgical History:  Procedure Laterality Date   APPENDECTOMY  2013   BILIARY STENT PLACEMENT N/A 05/02/2018   Procedure: BILIARY STENT PLACEMENT;  Surgeon: Doran Stabler, MD;  Location: [facility];  Service: Gastroenterology;  Laterality: N/A;   BOWEL RESECTION  12/03/2020   Procedure: SMALL BOWEL RESECTION;  Surgeon: Kieth Brightly, Arta Bruce, MD;  Location: [place];  Service: General;;   CARPAL TUNNEL RELEASE     CESAREAN SECTION  2000; 2007; 2011   CHOLECYSTECTOMY N/A 04/03/2018   Procedure: LAPAROSCOPIC CHOLECYSTECTOMY;  Surgeon: Rolm Bookbinder, MD;  Location: [place];  Service: General;  Laterality: N/A;   [place] N/A 10/22/2020   Procedure: DILATATION & CURETTAGE/HYSTEROSCOPY WITH MYOSURE;  Surgeon: Cheri Fowler, MD;   Location: [place];  Service: Gynecology;  Laterality: N/A;   DILATION AND CURETTAGE OF UTERUS     ERCP N/A 05/02/2018   Procedure: ENDOSCOPIC RETROGRADE CHOLANGIOPANCREATOGRAPHY (ERCP);  Surgeon: Doran Stabler, MD;  Location: [facility];  Service: Gastroenterology;  Laterality: N/A;   ERCP N/A 07/26/2018   Procedure: ENDOSCOPIC RETROGRADE CHOLANGIOPANCREATOGRAPHY (ERCP);  Surgeon: Irene Shipper, MD;  Location: [facility];  Service: Endoscopy;  Laterality: N/A;  stent removal   IR RADIOLOGIST EVAL & MGMT  05/19/2018   LAMINECTOMY N/A 12/13/2014   Procedure: CERVICAL LAMINECTOMY FOR INTRADURAL TUMOR;  Surgeon: Charlie Pitter, MD;  Location: [place] [facility];  Service: Neurosurgery;  Laterality: N/A;  posterior   LAPAROSCOPY N/A 07/20/2017   Procedure: LAPAROSCOPY OPERATIVE WITH WEDGE RESECTION RIGHT CORNUA AND PARTIAL SALPINGECTOMY;  Surgeon: [name], MD;  Location: [facility];  Service: Gynecology;  Laterality: N/A;   REDUCTION MAMMAPLASTY Bilateral 1998   REMOVAL OF STONES  05/02/2018   Procedure: REMOVAL OF STONES;  Surgeon: Doran Stabler, MD;  Location: [facility];  Service: Gastroenterology;;   [name]  05/02/2018   Procedure: SPHINCTEROTOMY;  Surgeon: Doran Stabler, MD;  [facility];  Service: Gastroenterology;;   STENT REMOVAL  07/26/2018   Procedure: STENT REMOVAL;  Surgeon: Irene Shipper, MD;  Location: [facility];  Service: Endoscopy;;   TOTAL LAPAROSCOPIC HYSTERECTOMY WITH SALPINGECTOMY Bilateral 12/03/2020   Procedure: ATTEMPTED  LAPAROSCOPIC, OPEN ABDOMINAL HYSTERECTOMY WITH BILATERAL SALPINGO-OOPHERECTOMY AND LYSIS OF ADHESIONS;  Surgeon: Cheri Fowler, MD;  Location: [place];  Service: Gynecology;  Laterality: Bilateral;   Patient Active Problem List   Diagnosis Date Noted   Uterine leiomyoma 12/03/2020   S/P abdominal hysterectomy 12/03/2020   Bile leak, postoperative    Choledocholithiasis    Intra-abdominal fluid collection    Anemia 04/28/2018    Morbid obesity with body mass index (BMI) of 50.0 to 59.9 in adult [facility]) 04/02/2018   Mass of uterus 04/02/2018   Transaminitis 04/02/2018   Acute cholecystitis s/p lap cholecystectomy 04/03/2018 04/01/2018   Pregnancy, ectopic, cornual or cervical 07/21/2017   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   Neurogenic bladder 08/23/2015   Bilateral carpal tunnel syndrome 08/23/2015   Paraparesis of both lower limbs ([place]) 07/14/2015   Ileus, postoperative ([place]) 12/25/2014   Diabetes mellitus type 2 in obese ([place]) 12/25/2014   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[place]) 12/24/2014   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[place]) 12/24/2014   Ileus of unspecified type ([place])    Atelectasis    Bowel obstruction ([place])    Encounter for central line care    Ileus ([place])    Intestinal occlusion ([place])    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   Headache 11/19/2014   Neck pain 11/19/2014   Right sided weakness 11/19/2014   Paresthesias 11/19/2014   Nightmares 09/11/2014   HTN (hypertension), benign 03/28/2014   Nephrolithiasis 03/27/2014   Diabetes mellitus ([place]) 03/27/2014   Abdominal pain 03/27/2014    ONSET DATE: 06/16/2022   REFERRING DIAG: R53.1 weakness of lower extremities, Z74.09 reduced mobility   THERAPY DIAG:  Muscle weakness (generalized)  Difficulty in walking, not elsewhere classified  Unsteadiness on feet  Rationale for Evaluation and Treatment Rehabilitation  SUBJECTIVE:                                                                                                                                                                                              SUBJECTIVE STATEMENT: Pt ambulated into clinic slowly and reported 10/10 pain, states she is in a "sickle cell crisis" and is going to the ER tomorrow for treatment. Pt reports she was unable to come to therapy Monday of this week (8/7) due to aforementioned crisis. Pt agreeable to end session early due to pain and to allow pt to go to  hospital to receive treatment today. Pt reported she did not want to be  admitted "looking like a hot mess" and would rather go tomorrow. Informed pt to call clinic if she is unable to attend therapy next week, pt verbalized understanding.   Pt accompanied by: self  PERTINENT HISTORY: Sickle Cell Anemia with frequent pain crises, hypertension, Type II diabetes and C1-C4 incomplete quadriparesis   PAIN:  Are you having pain? Yes: NPRS scale: 10/10 Pain location: entire body Pain description: Achy/sharp  PRECAUTIONS: Fall   FALLS: Has patient fallen in last 6 months? Yes. Number of falls at least 20, reports she always fall to L side   PLOF: Requires assistive device for independence, Needs assistance with ADLs, Needs assistance with homemaking, Needs assistance with gait, and has home aide that comes 7 days/week   PATIENT GOALS "To get my strength back"   OBJECTIVE:    SENSATION: Impaired 2/2 incomplete quad at C1-C4    TODAY'S TREATMENT:  Arrived no charge   GAIT: Gait pattern: step through pattern, decreased arm swing- Right, decreased arm swing- Left, decreased step length- Left, decreased stance time- Right, decreased stride length, decreased hip/knee flexion- Right, decreased hip/knee flexion- Left, decreased ankle dorsiflexion- Right, decreased ankle dorsiflexion- Left, Right foot flat, Left foot flat, antalgic, lateral hip instability, lateral lean- Right, decreased trunk rotation, wide BOS, poor foot clearance- Right, and poor foot clearance- Left Distance walked: Various clinic distances  Assistive device utilized: None Level of assistance: SBA Comments: Pt with decreased cadence and antalgic gait pattern     PATIENT EDUCATION: Education details: clinic's cancellation/no-show policy  Person educated: Patient Education method: Customer service manager Education comprehension: verbalized understanding and needs further education   HOME EXERCISE PROGRAM: To  be established next session     GOALS: Goals reviewed with patient? Yes  SHORT TERM GOALS: Target date: 07/15/2022  Pt will be independent with initial HEP for improved strength, balance, transfers and gait.  Baseline: not established on eval Goal status: INITIAL  2.  Pt will improve gait velocity to at least 1.3 ft/s w/LRAD for improved gait efficiency and safety.  Baseline: 0.99 ft/s without AD Goal status: INITIAL   LONG TERM GOALS: Target date: 08/05/2022  Pt will verbalize plan to continue fitness on land and in pool post-DC for continued functional strength gains.   Baseline:  Goal status: INITIAL  2.  Pt will improve gait velocity to at least 1.9 ft/s w/LRAD for improved gait efficiency and performance and reduced fall risk   Baseline: 0.99 ft/s without AD Goal status: INITIAL  3.  Pt will improve 5 x STS to less than or equal to 29 seconds w/BUE support to demonstrate improved functional strength and transfer efficiency.   Baseline: 34.84s w/BUE support  Goal status: INITIAL  ASSESSMENT:  CLINICAL IMPRESSION: Arrived no charge. See subjective for details     OBJECTIVE IMPAIRMENTS Abnormal gait, cardiopulmonary status limiting activity, decreased activity tolerance, decreased endurance, decreased mobility, difficulty walking, decreased strength, decreased safety awareness, impaired sensation, improper body mechanics, and pain.   ACTIVITY LIMITATIONS carrying, lifting, bending, standing, squatting, stairs, transfers, bed mobility, continence, bathing, toileting, dressing, reach over head, hygiene/grooming, locomotion level, and caring for others  PARTICIPATION LIMITATIONS: meal prep, cleaning, laundry, medication management, personal finances, interpersonal relationship, driving, shopping, community activity, and yard work  [place], Past/current experiences, Time since onset of injury/illness/exacerbation, Transportation, and 1 comorbidity:  incomplete C1-C4 quadriplegia  are also affecting patient's functional outcome.   REHAB POTENTIAL: Fair due to history of SCI, body habitus and pain  CLINICAL DECISION MAKING:  Stable/uncomplicated  EVALUATION COMPLEXITY: Low  PLAN: PT FREQUENCY: 2x/week (split between land/aquatic)  PT DURATION: 6 weeks  PLANNED INTERVENTIONS: Therapeutic exercises, Therapeutic activity, Neuromuscular re-education, Balance training, Gait training, Patient/Family education, Self Care, Stair training, DME instructions, Aquatic Therapy, Wheelchair mobility training, Manual therapy, and Re-evaluation  PLAN FOR NEXT SESSION: Did pt go to hospital? Establish HEP for strength, scifit for endurance, stairs    Inita Uram E Angeles Paolucci, PT, DPT 07/08/2022, 2:23 PM

## 2022-07-08 NOTE — ED Triage Notes (Signed)
PER EMS: pt is from home with c/o sickle cell pain crisis, reports pain all over x 3 weeks.  BP- 164/115, HR-104, 99% RA, 266 CBG

## 2022-07-08 NOTE — ED Notes (Signed)
Pt advises she is a hard stick. This writer looked for potential vein to stick but nothing was seen. Triage RN notified.

## 2022-07-09 ENCOUNTER — Other Ambulatory Visit: Payer: Self-pay

## 2022-07-09 ENCOUNTER — Encounter (HOSPITAL_COMMUNITY): Payer: Self-pay | Admitting: Internal Medicine

## 2022-07-09 DIAGNOSIS — E1142 Type 2 diabetes mellitus with diabetic polyneuropathy: Secondary | ICD-10-CM | POA: Diagnosis present

## 2022-07-09 DIAGNOSIS — Z20822 Contact with and (suspected) exposure to covid-19: Secondary | ICD-10-CM | POA: Diagnosis present

## 2022-07-09 DIAGNOSIS — G894 Chronic pain syndrome: Secondary | ICD-10-CM | POA: Diagnosis present

## 2022-07-09 DIAGNOSIS — J45909 Unspecified asthma, uncomplicated: Secondary | ICD-10-CM | POA: Diagnosis present

## 2022-07-09 DIAGNOSIS — Z794 Long term (current) use of insulin: Secondary | ICD-10-CM

## 2022-07-09 DIAGNOSIS — E860 Dehydration: Secondary | ICD-10-CM | POA: Diagnosis present

## 2022-07-09 DIAGNOSIS — M5127 Other intervertebral disc displacement, lumbosacral region: Secondary | ICD-10-CM | POA: Diagnosis present

## 2022-07-09 DIAGNOSIS — N179 Acute kidney failure, unspecified: Secondary | ICD-10-CM | POA: Diagnosis present

## 2022-07-09 DIAGNOSIS — I10 Essential (primary) hypertension: Secondary | ICD-10-CM | POA: Diagnosis not present

## 2022-07-09 DIAGNOSIS — D5700 Hb-SS disease with crisis, unspecified: Secondary | ICD-10-CM

## 2022-07-09 DIAGNOSIS — E876 Hypokalemia: Secondary | ICD-10-CM | POA: Diagnosis present

## 2022-07-09 DIAGNOSIS — Z87891 Personal history of nicotine dependence: Secondary | ICD-10-CM | POA: Diagnosis not present

## 2022-07-09 DIAGNOSIS — Z803 Family history of malignant neoplasm of breast: Secondary | ICD-10-CM | POA: Diagnosis not present

## 2022-07-09 DIAGNOSIS — Z6841 Body Mass Index (BMI) 40.0 and over, adult: Secondary | ICD-10-CM | POA: Diagnosis not present

## 2022-07-09 DIAGNOSIS — Z833 Family history of diabetes mellitus: Secondary | ICD-10-CM | POA: Diagnosis not present

## 2022-07-09 DIAGNOSIS — E872 Acidosis, unspecified: Secondary | ICD-10-CM

## 2022-07-09 DIAGNOSIS — M47816 Spondylosis without myelopathy or radiculopathy, lumbar region: Secondary | ICD-10-CM | POA: Diagnosis present

## 2022-07-09 DIAGNOSIS — M545 Low back pain, unspecified: Secondary | ICD-10-CM | POA: Diagnosis not present

## 2022-07-09 DIAGNOSIS — Z8249 Family history of ischemic heart disease and other diseases of the circulatory system: Secondary | ICD-10-CM | POA: Diagnosis not present

## 2022-07-09 DIAGNOSIS — D72829 Elevated white blood cell count, unspecified: Secondary | ICD-10-CM

## 2022-07-09 DIAGNOSIS — Z8041 Family history of malignant neoplasm of ovary: Secondary | ICD-10-CM | POA: Diagnosis not present

## 2022-07-09 DIAGNOSIS — E119 Type 2 diabetes mellitus without complications: Secondary | ICD-10-CM

## 2022-07-09 DIAGNOSIS — F209 Schizophrenia, unspecified: Secondary | ICD-10-CM | POA: Diagnosis present

## 2022-07-09 LAB — CBC WITH DIFFERENTIAL/PLATELET
Abs Immature Granulocytes: 0.04 10*3/uL (ref 0.00–0.07)
Basophils Absolute: 0 10*3/uL (ref 0.0–0.1)
Basophils Relative: 0 %
Eosinophils Absolute: 0.1 10*3/uL (ref 0.0–0.5)
Eosinophils Relative: 1 %
HCT: 35.1 % — ABNORMAL LOW (ref 36.0–46.0)
Hemoglobin: 11 g/dL — ABNORMAL LOW (ref 12.0–15.0)
Immature Granulocytes: 0 %
Lymphocytes Relative: 24 %
Lymphs Abs: 2.6 10*3/uL (ref 0.7–4.0)
MCH: 22.7 pg — ABNORMAL LOW (ref 26.0–34.0)
MCHC: 31.3 g/dL (ref 30.0–36.0)
MCV: 72.5 fL — ABNORMAL LOW (ref 80.0–100.0)
Monocytes Absolute: 0.6 10*3/uL (ref 0.1–1.0)
Monocytes Relative: 5 %
Neutro Abs: 7.4 10*3/uL (ref 1.7–7.7)
Neutrophils Relative %: 70 %
Platelets: 347 10*3/uL (ref 150–400)
RBC: 4.84 MIL/uL (ref 3.87–5.11)
RDW: 18 % — ABNORMAL HIGH (ref 11.5–15.5)
WBC: 10.8 10*3/uL — ABNORMAL HIGH (ref 4.0–10.5)
nRBC: 0 % (ref 0.0–0.2)

## 2022-07-09 LAB — HEMOGLOBIN A1C
Hgb A1c MFr Bld: 8 % — ABNORMAL HIGH (ref 4.8–5.6)
Mean Plasma Glucose: 182.9 mg/dL

## 2022-07-09 LAB — COMPREHENSIVE METABOLIC PANEL
ALT: 14 U/L (ref 0–44)
AST: 14 U/L — ABNORMAL LOW (ref 15–41)
Albumin: 3.3 g/dL — ABNORMAL LOW (ref 3.5–5.0)
Alkaline Phosphatase: 72 U/L (ref 38–126)
Anion gap: 12 (ref 5–15)
BUN: 27 mg/dL — ABNORMAL HIGH (ref 6–20)
CO2: 28 mmol/L (ref 22–32)
Calcium: 8.2 mg/dL — ABNORMAL LOW (ref 8.9–10.3)
Chloride: 97 mmol/L — ABNORMAL LOW (ref 98–111)
Creatinine, Ser: 1.17 mg/dL — ABNORMAL HIGH (ref 0.44–1.00)
GFR, Estimated: 59 mL/min — ABNORMAL LOW (ref >60)
Glucose, Bld: 171 mg/dL — ABNORMAL HIGH (ref 70–99)
Potassium: 2.5 mmol/L — CL (ref 3.5–5.1)
Sodium: 137 mmol/L (ref 135–145)
Total Bilirubin: 0.5 mg/dL (ref 0.3–1.2)
Total Protein: 7.7 g/dL (ref 6.5–8.1)

## 2022-07-09 LAB — TROPONIN I (HIGH SENSITIVITY): Troponin I (High Sensitivity): 5 ng/L (ref <18)

## 2022-07-09 LAB — CBG MONITORING, ED
Glucose-Capillary: 141 mg/dL — ABNORMAL HIGH (ref 70–99)
Glucose-Capillary: 198 mg/dL — ABNORMAL HIGH (ref 70–99)
Glucose-Capillary: 242 mg/dL — ABNORMAL HIGH (ref 70–99)
Glucose-Capillary: 73 mg/dL (ref 70–99)

## 2022-07-09 LAB — HIV ANTIBODY (ROUTINE TESTING W REFLEX): HIV Screen 4th Generation wRfx: NONREACTIVE

## 2022-07-09 LAB — URINALYSIS, ROUTINE W REFLEX MICROSCOPIC
Bilirubin Urine: NEGATIVE
Glucose, UA: NEGATIVE mg/dL
Ketones, ur: NEGATIVE mg/dL
Nitrite: NEGATIVE
Protein, ur: 30 mg/dL — AB
Specific Gravity, Urine: 1.019 (ref 1.005–1.030)
pH: 5 (ref 5.0–8.0)

## 2022-07-09 LAB — MAGNESIUM: Magnesium: 1.7 mg/dL (ref 1.7–2.4)

## 2022-07-09 LAB — C-REACTIVE PROTEIN: CRP: 2.5 mg/dL — ABNORMAL HIGH (ref <1.0)

## 2022-07-09 LAB — GLUCOSE, CAPILLARY
Glucose-Capillary: 123 mg/dL — ABNORMAL HIGH (ref 70–99)
Glucose-Capillary: 211 mg/dL — ABNORMAL HIGH (ref 70–99)

## 2022-07-09 LAB — PROCALCITONIN: Procalcitonin: <0.1 ng/mL

## 2022-07-09 LAB — LACTIC ACID, PLASMA: Lactic Acid, Venous: 0.9 mmol/L (ref 0.5–1.9)

## 2022-07-09 LAB — SARS CORONAVIRUS 2 BY RT PCR: SARS Coronavirus 2 by RT PCR: NEGATIVE

## 2022-07-09 MED ORDER — INSULIN ASPART PROT & ASPART (70-30 MIX) 100 UNIT/ML ~~LOC~~ SUSP
36.0000 [IU] | Freq: Two times a day (BID) | SUBCUTANEOUS | Status: DC
  Administered 2022-07-10 – 2022-07-14 (×10): 36 [IU] via SUBCUTANEOUS
  Filled 2022-07-09: qty 10

## 2022-07-09 MED ORDER — SODIUM CHLORIDE 0.9% FLUSH
3.0000 mL | Freq: Two times a day (BID) | INTRAVENOUS | Status: DC
  Administered 2022-07-09 – 2022-07-13 (×7): 3 mL via INTRAVENOUS

## 2022-07-09 MED ORDER — INSULIN ASPART PROT & ASPART (70-30 MIX) 100 UNIT/ML ~~LOC~~ SUSP
36.0000 [IU] | SUBCUTANEOUS | Status: DC
  Administered 2022-07-09: 36 [IU] via SUBCUTANEOUS
  Filled 2022-07-09: qty 10

## 2022-07-09 MED ORDER — NALOXONE HCL 0.4 MG/ML IJ SOLN: 0.4000 mg | INTRAMUSCULAR | Status: DC | PRN

## 2022-07-09 MED ORDER — DIAZEPAM 5 MG PO TABS
5.0000 mg | ORAL_TABLET | Freq: Two times a day (BID) | ORAL | Status: DC | PRN
  Administered 2022-07-10 – 2022-07-13 (×3): 5 mg via ORAL
  Filled 2022-07-09 (×3): qty 1

## 2022-07-09 MED ORDER — ENOXAPARIN SODIUM 40 MG/0.4ML IJ SOSY
40.0000 mg | PREFILLED_SYRINGE | INTRAMUSCULAR | Status: DC
  Administered 2022-07-09 – 2022-07-12 (×4): 40 mg via SUBCUTANEOUS
  Filled 2022-07-09 (×4): qty 0.4

## 2022-07-09 MED ORDER — SODIUM CHLORIDE 0.9 % IV SOLN
1.0000 g | Freq: Once | INTRAVENOUS | Status: AC
  Administered 2022-07-09: 1 g via INTRAVENOUS
  Filled 2022-07-09: qty 10

## 2022-07-09 MED ORDER — INSULIN ASPART 100 UNIT/ML IJ SOLN
0.0000 [IU] | Freq: Three times a day (TID) | INTRAMUSCULAR | Status: DC
  Filled 2022-07-09: qty 0.2

## 2022-07-09 MED ORDER — HYDRALAZINE HCL 20 MG/ML IJ SOLN: 10.0000 mg | Freq: Four times a day (QID) | INTRAMUSCULAR | Status: DC | PRN

## 2022-07-09 MED ORDER — POTASSIUM CHLORIDE 10 MEQ/100ML IV SOLN
10.0000 meq | INTRAVENOUS | Status: AC
  Administered 2022-07-09 (×3): 10 meq via INTRAVENOUS
  Filled 2022-07-09 (×3): qty 100

## 2022-07-09 MED ORDER — HYDROMORPHONE HCL 2 MG/ML IJ SOLN
2.0000 mg | INTRAMUSCULAR | Status: DC | PRN
  Administered 2022-07-09 (×2): 2 mg via INTRAVENOUS
  Filled 2022-07-09 (×2): qty 1

## 2022-07-09 MED ORDER — ENSURE ENLIVE PO LIQD
237.0000 mL | Freq: Two times a day (BID) | ORAL | Status: DC
  Administered 2022-07-10: 237 mL via ORAL

## 2022-07-09 MED ORDER — ACETAMINOPHEN 650 MG RE SUPP: 650.0000 mg | Freq: Four times a day (QID) | RECTAL | Status: DC | PRN

## 2022-07-09 MED ORDER — HYDROMORPHONE HCL 1 MG/ML IJ SOLN
1.0000 mg | INTRAMUSCULAR | Status: DC | PRN
  Administered 2022-07-09 – 2022-07-10 (×3): 1 mg via INTRAVENOUS
  Filled 2022-07-09 (×4): qty 1

## 2022-07-09 MED ORDER — HYDROMORPHONE 1 MG/ML IV SOLN: INTRAVENOUS | Status: DC

## 2022-07-09 MED ORDER — POLYETHYLENE GLYCOL 3350 17 G PO PACK: 17.0000 g | PACK | Freq: Every day | ORAL | Status: DC | PRN

## 2022-07-09 MED ORDER — ONDANSETRON HCL 4 MG PO TABS: 4.0000 mg | ORAL_TABLET | Freq: Four times a day (QID) | ORAL | Status: DC | PRN

## 2022-07-09 MED ORDER — FUROSEMIDE 40 MG PO TABS
40.0000 mg | ORAL_TABLET | Freq: Every day | ORAL | Status: DC
  Administered 2022-07-09 – 2022-07-14 (×6): 40 mg via ORAL
  Filled 2022-07-09 (×6): qty 1

## 2022-07-09 MED ORDER — ACETAMINOPHEN 325 MG PO TABS
650.0000 mg | ORAL_TABLET | Freq: Four times a day (QID) | ORAL | Status: DC | PRN
  Administered 2022-07-11: 650 mg via ORAL
  Filled 2022-07-09: qty 2

## 2022-07-09 MED ORDER — SODIUM CHLORIDE 0.9 % IV SOLN
12.5000 mg | INTRAVENOUS | Status: DC | PRN
  Administered 2022-07-09: 12.5 mg via INTRAVENOUS
  Filled 2022-07-09: qty 12.5
  Filled 2022-07-09: qty 0.5

## 2022-07-09 MED ORDER — MORPHINE SULFATE ER 15 MG PO TBCR
15.0000 mg | EXTENDED_RELEASE_TABLET | Freq: Two times a day (BID) | ORAL | Status: DC
  Filled 2022-07-09: qty 1

## 2022-07-09 MED ORDER — DIPHENHYDRAMINE HCL 25 MG PO CAPS: 25.0000 mg | ORAL_CAPSULE | ORAL | Status: DC | PRN

## 2022-07-09 MED ORDER — SODIUM CHLORIDE 0.9% FLUSH: 9.0000 mL | INTRAVENOUS | Status: DC | PRN

## 2022-07-09 MED ORDER — HYDROMORPHONE HCL 2 MG/ML IJ SOLN
2.0000 mg | Freq: Once | INTRAMUSCULAR | Status: AC
  Administered 2022-07-09: 2 mg via INTRAVENOUS
  Filled 2022-07-09: qty 1

## 2022-07-09 MED ORDER — INSULIN LISPRO PROT & LISPRO (75-25 MIX) 100 UNIT/ML KWIKPEN: 36.0000 [IU] | PEN_INJECTOR | Freq: Three times a day (TID) | SUBCUTANEOUS | Status: DC

## 2022-07-09 MED ORDER — ONDANSETRON HCL 4 MG/2ML IJ SOLN: 4.0000 mg | Freq: Four times a day (QID) | INTRAMUSCULAR | Status: DC | PRN

## 2022-07-09 MED ORDER — HYDROMORPHONE HCL 1 MG/ML IJ SOLN: 1.0000 mg | INTRAMUSCULAR | Status: DC | PRN

## 2022-07-09 MED ORDER — ORAL CARE MOUTH RINSE: 15.0000 mL | OROMUCOSAL | Status: DC | PRN

## 2022-07-09 NOTE — Progress Notes (Signed)
  Cynthia Rosales is a 41 year old female with a medical history significant for sickle cell disease, chronic pain syndrome,opiate dependence and tolerance, anemia of chronic disease, peripheral neuropathy, chronic back pain, type 2 diabetes mellitus, essential hypertension, and morbid obesity was admitted overnight for sickle cell pain crisis.  Patient states that pain intensity is always 10/10 and has been worsening over the past several days.  Patient states that she has right-sided paralysis that has been worsening.  She reports burning/shooting pain primarily to fingertips and toes.  She states that she previously had a tumor removed from her spine that resulted in some right-sided weakness.  She has been unable to ambulate without assistance.  She has been taking oxycodone 30 mg every 4 hours as needed without any relief at home. Patient's pain medications are managed by her PCP.  Care plan: Reviewed previous labs Continue pain management Will decrease NovoLog 70/30 to 36 units every 12 hours.  Patient has been experiencing hypoglycemia frequently at home.  Discontinue meal coverage.  Continue monitoring blood glucose frequently. Will continue IV fluids, acute kidney injury.  Will reassess in a.m.   Donia Pounds  APRN, MSN, FNP-C [facility] [address] [place], [place] [address] [phone]

## 2022-07-09 NOTE — Assessment & Plan Note (Signed)
.   Continue home regimen of Humalog 75/25 . Patient been placed on Accu-Cheks before every meal and nightly with sliding scale insulin . Hemoglobin A1C ordered . Diabetic Diet

## 2022-07-09 NOTE — Assessment & Plan Note (Signed)
Lactic acidosis likely secondary to some degree of  volume depletion Hydrate patient intravenous isotonic fluids while concurrently treating underlying infection Monitoring serial lactic acid levels to ensure downtrending and resolution.

## 2022-07-09 NOTE — Assessment & Plan Note (Addendum)
   Chest x-ray unremarkable  Urinalysis not particularly suggestive of urinary tract infection  Physical exam does not reveal any obvious evidence of infection despite patient complaining of pain and tenderness everywhere.  Obtaining procalcitonin and CRP  Leukocytosis seemingly chronic based on review of labs

## 2022-07-09 NOTE — H&P (Deleted)
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  Patient explains that for approximately the past 1 week she has been experiencing diffuse pain.  Patient describes this pain as aching in quality, severe in intensity and radiating diffusely.  Pain is worse with movement or weightbearing and associated with generalized weakness.  Patient denies fevers, sick contacts, cough, shortness of breath, dysuria or diarrhea.  Patient denies any contacts with confirmed COVID-19 infection.  Patient states that this feels like a sickle cell crisis but states that it is much worse than usual.  Due to patient's symptoms patient eventually contacted EMS who promptly came to evaluate the patient and brought her to [facility] emergency department for evaluation.  Upon evaluation in the emergency department patient received intravenous fluids in addition to multiple doses of intravenous Dilaudid and intravenous Toradol with no improvement in pain.  Clinically it was felt the patient may be suffering from a acute sickle cell crisis and therefore the hospitalist group has been called to assess the patient for admission to the hospital.  Review of Systems: Review of Systems  Musculoskeletal:  Positive for myalgias.  Neurological:  Positive for weakness.  All other systems reviewed and are negative.    Past Medical History:  Diagnosis Date   Abscess of bursa, left elbow 06/2013   Treated with I and D/antibiotics.    Anemia    Arthritis     Asthma    Depression    History of blood transfusion    "after I had one of my kids"   History of kidney stones    Hypertension    Ovarian cyst    Schizophrenia ([place])    Sickle cell anemia (HCC)    Type II diabetes mellitus ([place])     Past Surgical History:  Procedure Laterality Date   APPENDECTOMY  2013   BILIARY STENT PLACEMENT N/A 05/02/2018   Procedure: BILIARY STENT PLACEMENT;  Surgeon: Doran Stabler, MD;  Location: [facility];  Service: Gastroenterology;  Laterality: N/A;   BOWEL RESECTION  12/03/2020   Procedure: SMALL BOWEL RESECTION;  Surgeon: Kieth Brightly, Arta Bruce, MD;  Location: [place];  Service: General;;   CARPAL TUNNEL RELEASE     CESAREAN SECTION  2000; 2007; 2011   CHOLECYSTECTOMY N/A 04/03/2018   Procedure: LAPAROSCOPIC CHOLECYSTECTOMY;  Surgeon: Rolm Bookbinder, MD;  Location: [place];  Service: General;  Laterality: N/A;   [place] N/A 10/22/2020   Procedure: DILATATION & CURETTAGE/HYSTEROSCOPY WITH MYOSURE;  Surgeon: Cheri Fowler, MD;  Location: [place];  Service: Gynecology;  Laterality: N/A;   DILATION AND CURETTAGE OF UTERUS     ERCP N/A 05/02/2018   Procedure: ENDOSCOPIC RETROGRADE CHOLANGIOPANCREATOGRAPHY (ERCP);  Surgeon: Doran Stabler, MD;  Location: [facility];  Service: Gastroenterology;  Laterality: N/A;   ERCP N/A 07/26/2018   Procedure: ENDOSCOPIC RETROGRADE CHOLANGIOPANCREATOGRAPHY (ERCP);  Surgeon: Irene Shipper, MD;  Location: [facility];  Service: Endoscopy;  Laterality: N/A;  stent removal   IR RADIOLOGIST EVAL & MGMT  05/19/2018   LAMINECTOMY N/A 12/13/2014   Procedure: CERVICAL LAMINECTOMY FOR INTRADURAL TUMOR;  Surgeon: Charlie Pitter, MD;  Location: [place] [facility];  Service: Neurosurgery;  Laterality: N/A;  posterior   LAPAROSCOPY N/A 07/20/2017   Procedure: LAPAROSCOPY OPERATIVE WITH WEDGE RESECTION RIGHT CORNUA AND PARTIAL SALPINGECTOMY;  Surgeon: [name], MD;  Location: [place] [facility];  Service:  Gynecology;  Laterality: N/A;   REDUCTION MAMMAPLASTY Bilateral 1998   REMOVAL OF STONES  05/02/2018   Procedure: REMOVAL OF STONES;  Surgeon: Doran Stabler, MD;  Location: [facility];  Service: Gastroenterology;;   [name]  05/02/2018   Procedure: [id number];  Surgeon: Doran Stabler, MD;  [facility];  Service: Gastroenterology;;   Lavell Islam REMOVAL  07/26/2018   Procedure: STENT REMOVAL;  Surgeon: Irene Shipper, MD;  Location: [facility];  Service: Endoscopy;;   TOTAL LAPAROSCOPIC HYSTERECTOMY WITH SALPINGECTOMY Bilateral 12/03/2020   Procedure: ATTEMPTED  LAPAROSCOPIC, OPEN ABDOMINAL HYSTERECTOMY WITH BILATERAL SALPINGO-OOPHERECTOMY AND LYSIS OF ADHESIONS;  Surgeon: Cheri Fowler, MD;  Location: [place];  Service: Gynecology;  Laterality: Bilateral;    Social History:  reports that she has quit smoking. She has never used smokeless tobacco. She reports that she does not drink alcohol and does not use drugs.  Allergies  Allergen Reactions   Amoxicillin Hives, Rash and Other (See Comments)    PATIENT HAS HAD A PCN REACTION WITH IMMEDIATE RASH, FACIAL/TONGUE/THROAT SWELLING, SOB, OR LIGHTHEADEDNESS WITH HYPOTENSION:  #  #  YES  #  #  Has patient had a PCN reaction causing severe rash involving mucus membranes or skin necrosis: No Has patient had a PCN reaction that required hospitalization: No  Has patient had a PCN reaction occurring within the last 10 years: #  #  #  YES  #  #  #  If all of the above answers are "NO", then may proceed with Cephalosporin use.     Buprenorphine Hcl Itching and Nausea Only   Morphine And Related Itching and Nausea Only    Family History  Problem Relation Age of Onset   Diabetes Mother    Hypertension Mother    Breast cancer Maternal Aunt    Ovarian cancer Maternal Grandmother    Breast cancer Maternal Aunt    Migraines Neg Hx     Prior to Admission medications   Medication Sig Start Date End Date Taking? Authorizing  Provider  diazepam (VALIUM) 5 MG tablet Take 5 mg by mouth 2 (two) times daily as needed for muscle spasms.   Yes [provider]  diphenoxylate-atropine (LOMOTIL) 2.5-0.025 MG tablet Take 1-2 tablets by mouth 4 (four) times daily as needed for diarrhea or loose stools. 01/09/22  Yes Pollina, Gwenyth Allegra, MD  estradiol (CLIMARA - DOSED IN MG/24 HR) 0.075 mg/24hr patch Place 0.075 mg onto the skin once a week. 06/12/22  Yes [provider]  furosemide (LASIX) 40 MG tablet Take 40 mg by mouth daily. 04/15/22  Yes [provider]  HUMALOG MIX 75/25 KWIKPEN (75-25) 100 UNIT/ML KwikPen Inject 36 Units into the skin in the morning, at noon, and at bedtime. 04/07/22  Yes [provider]  hydrochlorothiazide (HYDRODIURIL) 25 MG tablet Take 25 mg by mouth daily.  04/27/18  Yes [provider]  ibuprofen (ADVIL) 800 MG tablet Take 800 mg by mouth 3 (three) times daily. 06/13/22  Yes [provider]  oxycodone (ROXICODONE) 30 MG immediate release tablet Take 1 tablet (30 mg total) by mouth  every 6 (six) hours as needed for pain. Patient taking differently: Take 45 mg by mouth every 4 (four) hours as needed for pain. 06/01/20  Yes Dorena Dew, FNP  OZEMPIC, 0.25 OR 0.5 MG/DOSE, 2 MG/3ML SOPN Inject 0.25 mg into the skin once a week. 06/17/22  Yes [provider]  potassium chloride (MICRO-K) 10 MEQ CR capsule Take 10 mEq by mouth daily. 05/18/22  Yes [provider]  sitaGLIPtin (JANUVIA) 100 MG tablet Take 100 mg by mouth daily.   Yes [provider]  benzonatate (TESSALON) 200 MG capsule Take 1 capsule (200 mg total) by mouth 3 (three) times daily as needed for cough. Patient not taking: Reported on 07/08/2022 01/09/22   Orpah Greek, MD  lidocaine (XYLOCAINE) 2 % solution Use as directed 15 mLs in the mouth or throat as needed for mouth pain. Patient not taking: Reported on 07/08/2022 01/09/22   Orpah Greek, MD   metoCLOPramide (REGLAN) 10 MG tablet Take 1 tablet (10 mg total) by mouth every 6 (six) hours. Patient not taking: Reported on 07/08/2022 07/30/21   Fatima Blank, MD  ondansetron (ZOFRAN) 4 MG tablet Take 1 tablet (4 mg total) by mouth every 6 (six) hours as needed for nausea or vomiting. Patient not taking: Reported on 07/08/2022 01/09/22   Orpah Greek, MD  ondansetron (ZOFRAN-ODT) 4 MG disintegrating tablet Take 1 tablet (4 mg total) by mouth every 8 (eight) hours as needed for nausea or vomiting. Patient not taking: Reported on 07/08/2022 07/10/21   Arnaldo Natal, MD    Physical Exam:  Vitals:   07/09/22 0130 07/09/22 0414 07/09/22 0415 07/09/22 0525  BP: 133/83  135/89   Pulse: 92  82 94  Resp: 19  (!) 24 18  Temp:  97.7 F (36.5 C)    TempSrc:  Oral    SpO2: 100%  97% 100%    Constitutional: Awake alert and oriented x3, patient in distress due to diffuse pain  skin: no rashes, no lesions, slightly poor skin turgor noted. Eyes: Pupils are equally reactive to light.  No evidence of scleral icterus or conjunctival pallor.  ENMT: Dry mucous membranes noted.  Posterior pharynx clear of any exudate or lesions.   Neck: normal, supple, no masses, no thyromegaly.  No evidence of jugular venous distension.   Respiratory: clear to auscultation bilaterally, no wheezing, no crackles. Normal respiratory effort. No accessory muscle use.  Cardiovascular: Regular rate and rhythm, no murmurs / rubs / gallops. No extremity edema. 2+ pedal pulses. No carotid bruits.  Chest:   Diffuse tenderness crepitus or deformity.   Back:   Diffuse tenderness without crepitus or deformity. Abdomen: Abdomen is soft and nontender.  No evidence of intra-abdominal masses.  Positive bowel sounds noted in all quadrants.   Musculoskeletal: Diffuse tenderness of the extremities without associated deformity.  Good ROM, no contractures. Normal muscle tone.  Neurologic: Patient exhibits decreased strength of  the proximal and distal muscle groups of the bilateral lower extremities as well as 4 out of 5 strength of the right upper extremity proximal and distal muscle groups.  CN 2-12 grossly intact. Sensation intact.  Patient moving all 4 extremities spontaneously.  Patient is following all commands.  Patient is responsive to verbal stimuli.   Psychiatric: Patient extremely anxious with labile affect.  Patient seems to possess insight as to their current situation.    Data Reviewed:  I have personally reviewed and interpreted labs, imaging.  Significant findings are:  Lactic acid 2.0 followed by 0.9 Troponin 5 Urinalysis revealing small leukocytes, many bacteria and 5-10 white blood cells per high-powered field COVID-19 PCR testing negative CBC revealing white blood cell count of 16.1, hemoglobin of 13, hematocrit 14.1, platelet count of 402 CXR:  Chest X-ray was personally reviewed.  No evidence of focal infiltrates.  No evidence of pleural effusion.  No evidence of pneumothorax.    EKG: Personally reviewed.  Rhythm is sinus tachycardia with heart rate of 103 bpm.  No dynamic ST segment changes appreciated.   Assessment and Plan: * Acute sickle cell crisis ([place]) Patient reports pain is similar but worse compared to other sickle cell pain crises On my assessment patient's current presentation is extremely pronounced and very atypical.  I am unable to pinpoint any specific area of pain as patient seems to have tenderness over every inch of her body No clinical evidence of acute chest syndrome Initiating high-dose intravenous opiate-based analgesics for now although I anticipate that we will be able to quickly wean this off and discontinue in preparation for discharge Abstaining from any NSAIDs at this time due to mild acute kidney injury Patient will be initiated on intravenous while resuscitation with half-normal saline No clinical evidence of infection thus far Monitoring progression of  disease with serial CBCs, serial reticulocyte counts, serial bilirubins   AKI (acute kidney injury) ([place]) Patient exhibiting evidence of mild acute kidney injury.  Possibly secondary to volume depletion but also possibly secondary to ongoing use of NSAIDs. Hydrating patient with intravenous isotonic fluids. Strict input and output monitoring Monitoring renal function and electrolytes with serial chemistries Avoiding nephrotoxic agents if at all possible   Lactic acidosis Lactic acidosis likely secondary to some degree of  volume depletion Hydrate patient intravenous isotonic fluids while concurrently treating underlying infection Monitoring serial lactic acid levels to ensure downtrending and resolution.   Leukocytosis Chest x-ray unremarkable Urinalysis not particularly suggestive of urinary tract infection Physical exam does not reveal any obvious evidence of infection despite patient complaining of pain and tenderness everywhere. Obtaining procalcitonin and CRP Leukocytosis seemingly chronic based on review of labs  Type 2 diabetes mellitus without complication, with long-term current use of insulin (HCC) Continue home regimen of Humalog 75/25 Patient been placed on Accu-Cheks before every meal and nightly with sliding scale insulin Hemoglobin A1C ordered Diabetic Diet   Essential hypertension Temporarily holding hydrochlorothiazide in the setting of acute kidney injury Will resume antihypertensives as able PRN intravenous antihypertensives for excessively elevated blood pressure         Code Status:  Full code  code status decision has been confirmed with: patient Family Communication: deferred   Consults: None, sickle cell team to evaluate this a.m.  Severity of Illness:  The appropriate patient status for this patient is OBSERVATION. Observation status is judged to be reasonable and necessary in order to provide the required intensity of service to ensure the  patient's safety. The patient's presenting symptoms, physical exam findings, and initial radiographic and laboratory data in the context of their medical condition is felt to place them at decreased risk for further clinical deterioration. Furthermore, it is anticipated that the patient will be medically stable for discharge from the hospital within 2 midnights of admission.   Author:  Vernelle Emerald MD  07/09/2022 8:14 AM

## 2022-07-09 NOTE — ED Notes (Signed)
Heat packs applied per patient request

## 2022-07-09 NOTE — ED Notes (Signed)
Pt was given 2 mg Dilaudid earlier but states the pain is still a 10

## 2022-07-09 NOTE — ED Notes (Signed)
The lab will add on the Hgb fractionation cascade to previous collected blood.

## 2022-07-09 NOTE — Assessment & Plan Note (Addendum)
   Patient reports pain is similar but worse compared to other sickle cell pain crises  On my assessment patient's current presentation is extremely pronounced and very atypical.  I am unable to pinpoint any specific area of pain as patient seems to have tenderness over every inch of her body  No clinical evidence of acute chest syndrome  Initiating high-dose intravenous opiate-based analgesics for now although I anticipate that we will be able to quickly wean this off and discontinue in preparation for discharge  Abstaining from any NSAIDs at this time due to mild acute kidney injury  Patient will be initiated on intravenous while resuscitation with half-normal saline  No clinical evidence of infection thus far  Monitoring progression of disease with serial CBCs, serial reticulocyte counts, serial bilirubins

## 2022-07-09 NOTE — Assessment & Plan Note (Signed)
.   Temporarily holding hydrochlorothiazide in the setting of acute kidney injury . Will resume antihypertensives as able . PRN intravenous antihypertensives for excessively elevated blood pressure

## 2022-07-09 NOTE — H&P (Addendum)
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  Patient explains that for approximately the past 1 week she has been experiencing diffuse pain.  Patient describes this pain as aching in quality, severe in intensity and radiating diffusely.  Pain is worse with movement or weightbearing and associated with generalized weakness.  Patient denies fevers, sick contacts, cough, shortness of breath, dysuria or diarrhea.  Patient denies any contacts with confirmed COVID-19 infection.  Patient states that this feels like a sickle cell crisis but states that it is much worse than usual.  Due to patient's symptoms patient eventually contacted EMS who promptly came to evaluate the patient and brought her to [facility] emergency department for evaluation.  Upon evaluation in the emergency department patient received intravenous fluids in addition to multiple doses of intravenous Dilaudid and intravenous Toradol with no improvement in pain.  Clinically it was felt the patient may be suffering from a acute sickle cell crisis and therefore the hospitalist group has been called to assess the patient for admission to the hospital.  Review of Systems: Review of Systems  Musculoskeletal:  Positive for myalgias.  Neurological:  Positive for weakness.  All other systems reviewed and are negative.    Past Medical History:  Diagnosis Date   Abscess of bursa, left elbow 06/2013   Treated with I and D/antibiotics.    Anemia    Arthritis     Asthma    Depression    History of blood transfusion    "after I had one of my kids"   History of kidney stones    Hypertension    Ovarian cyst    Schizophrenia ([place])    Sickle cell anemia (HCC)    Type II diabetes mellitus ([place])     Past Surgical History:  Procedure Laterality Date   APPENDECTOMY  2013   BILIARY STENT PLACEMENT N/A 05/02/2018   Procedure: BILIARY STENT PLACEMENT;  Surgeon: Doran Stabler, MD;  Location: [facility];  Service: Gastroenterology;  Laterality: N/A;   BOWEL RESECTION  12/03/2020   Procedure: SMALL BOWEL RESECTION;  Surgeon: Kieth Brightly, Arta Bruce, MD;  Location: [place];  Service: General;;   CARPAL TUNNEL RELEASE     CESAREAN SECTION  2000; 2007; 2011   CHOLECYSTECTOMY N/A 04/03/2018   Procedure: LAPAROSCOPIC CHOLECYSTECTOMY;  Surgeon: Rolm Bookbinder, MD;  Location: [place];  Service: General;  Laterality: N/A;   [place] N/A 10/22/2020   Procedure: DILATATION & CURETTAGE/HYSTEROSCOPY WITH MYOSURE;  Surgeon: Cheri Fowler, MD;  Location: [place];  Service: Gynecology;  Laterality: N/A;   DILATION AND CURETTAGE OF UTERUS     ERCP N/A 05/02/2018   Procedure: ENDOSCOPIC RETROGRADE CHOLANGIOPANCREATOGRAPHY (ERCP);  Surgeon: Doran Stabler, MD;  Location: [facility];  Service: Gastroenterology;  Laterality: N/A;   ERCP N/A 07/26/2018   Procedure: ENDOSCOPIC RETROGRADE CHOLANGIOPANCREATOGRAPHY (ERCP);  Surgeon: Irene Shipper, MD;  Location: [facility];  Service: Endoscopy;  Laterality: N/A;  stent removal   IR RADIOLOGIST EVAL & MGMT  05/19/2018   LAMINECTOMY N/A 12/13/2014   Procedure: CERVICAL LAMINECTOMY FOR INTRADURAL TUMOR;  Surgeon: Charlie Pitter, MD;  Location: [place] [facility];  Service: Neurosurgery;  Laterality: N/A;  posterior   LAPAROSCOPY N/A 07/20/2017   Procedure: LAPAROSCOPY OPERATIVE WITH WEDGE RESECTION RIGHT CORNUA AND PARTIAL SALPINGECTOMY;  Surgeon: [name], MD;  Location: [place] [facility];  Service:  Gynecology;  Laterality: N/A;   REDUCTION MAMMAPLASTY Bilateral 1998   REMOVAL OF STONES  05/02/2018   Procedure: REMOVAL OF STONES;  Surgeon: Doran Stabler, MD;  Location: [facility];  Service: Gastroenterology;;   [name]  05/02/2018   Procedure: [id number];  Surgeon: Doran Stabler, MD;  [facility];  Service: Gastroenterology;;   Lavell Islam REMOVAL  07/26/2018   Procedure: STENT REMOVAL;  Surgeon: Irene Shipper, MD;  [facility]: [facility];  Service: Endoscopy;;   TOTAL LAPAROSCOPIC HYSTERECTOMY WITH SALPINGECTOMY Bilateral 12/03/2020   Procedure: ATTEMPTED  LAPAROSCOPIC, OPEN ABDOMINAL HYSTERECTOMY WITH BILATERAL SALPINGO-OOPHERECTOMY AND LYSIS OF ADHESIONS;  Surgeon: Cheri Fowler, MD;  Location: [place];  Service: Gynecology;  Laterality: Bilateral;    Social History:  reports that she has quit smoking. She has never used smokeless tobacco. She reports that she does not drink alcohol and does not use drugs.  Allergies  Allergen Reactions   Amoxicillin Hives, Rash and Other (See Comments)    PATIENT HAS HAD A PCN REACTION WITH IMMEDIATE RASH, FACIAL/TONGUE/THROAT SWELLING, SOB, OR LIGHTHEADEDNESS WITH HYPOTENSION:  #  #  YES  #  #  Has patient had a PCN reaction causing severe rash involving mucus membranes or skin necrosis: No Has patient had a PCN reaction that required hospitalization: No  Has patient had a PCN reaction occurring within the last 10 years: #  #  #  YES  #  #  #  If all of the above answers are "NO", then may proceed with Cephalosporin use.     Buprenorphine Hcl Itching and Nausea Only   Morphine And Related Itching and Nausea Only    Family History  Problem Relation Age of Onset   Diabetes Mother    Hypertension Mother    Breast cancer Maternal Aunt    Ovarian cancer Maternal Grandmother    Breast cancer Maternal Aunt    Migraines Neg Hx     Prior to Admission medications   Medication Sig Start Date End Date Taking? Authorizing  Provider  diazepam (VALIUM) 5 MG tablet Take 5 mg by mouth 2 (two) times daily as needed for muscle spasms.   Yes [provider]  diphenoxylate-atropine (LOMOTIL) 2.5-0.025 MG tablet Take 1-2 tablets by mouth 4 (four) times daily as needed for diarrhea or loose stools. 01/09/22  Yes Pollina, Gwenyth Allegra, MD  estradiol (CLIMARA - DOSED IN MG/24 HR) 0.075 mg/24hr patch Place 0.075 mg onto the skin once a week. 06/12/22  Yes [provider]  furosemide (LASIX) 40 MG tablet Take 40 mg by mouth daily. 04/15/22  Yes [provider]  HUMALOG MIX 75/25 KWIKPEN (75-25) 100 UNIT/ML KwikPen Inject 36 Units into the skin in the morning, at noon, and at bedtime. 04/07/22  Yes [provider]  hydrochlorothiazide (HYDRODIURIL) 25 MG tablet Take 25 mg by mouth daily.  04/27/18  Yes [provider]  ibuprofen (ADVIL) 800 MG tablet Take 800 mg by mouth 3 (three) times daily. 06/13/22  Yes [provider]  oxycodone (ROXICODONE) 30 MG immediate release tablet Take 1 tablet (30 mg total) by mouth  every 6 (six) hours as needed for pain. Patient taking differently: Take 45 mg by mouth every 4 (four) hours as needed for pain. 06/01/20  Yes Dorena Dew, FNP  OZEMPIC, 0.25 OR 0.5 MG/DOSE, 2 MG/3ML SOPN Inject 0.25 mg into the skin once a week. 06/17/22  Yes [provider]  potassium chloride (MICRO-K) 10 MEQ CR capsule Take 10 mEq by mouth daily. 05/18/22  Yes [provider]  sitaGLIPtin (JANUVIA) 100 MG tablet Take 100 mg by mouth daily.   Yes [provider]  benzonatate (TESSALON) 200 MG capsule Take 1 capsule (200 mg total) by mouth 3 (three) times daily as needed for cough. Patient not taking: Reported on 07/08/2022 01/09/22   Orpah Greek, MD  lidocaine (XYLOCAINE) 2 % solution Use as directed 15 mLs in the mouth or throat as needed for mouth pain. Patient not taking: Reported on 07/08/2022 01/09/22   Orpah Greek, MD   metoCLOPramide (REGLAN) 10 MG tablet Take 1 tablet (10 mg total) by mouth every 6 (six) hours. Patient not taking: Reported on 07/08/2022 07/30/21   Fatima Blank, MD  ondansetron (ZOFRAN) 4 MG tablet Take 1 tablet (4 mg total) by mouth every 6 (six) hours as needed for nausea or vomiting. Patient not taking: Reported on 07/08/2022 01/09/22   Orpah Greek, MD  ondansetron (ZOFRAN-ODT) 4 MG disintegrating tablet Take 1 tablet (4 mg total) by mouth every 8 (eight) hours as needed for nausea or vomiting. Patient not taking: Reported on 07/08/2022 07/10/21   Arnaldo Natal, MD    Physical Exam:  Vitals:   07/09/22 0130 07/09/22 0414 07/09/22 0415 07/09/22 0525  BP: 133/83  135/89   Pulse: 92  82 94  Resp: 19  (!) 24 18  Temp:  97.7 F (36.5 C)    TempSrc:  Oral    SpO2: 100%  97% 100%    Constitutional: Awake alert and oriented x3, patient in distress due to diffuse pain  skin: no rashes, no lesions, slightly poor skin turgor noted. Eyes: Pupils are equally reactive to light.  No evidence of scleral icterus or conjunctival pallor.  ENMT: Dry mucous membranes noted.  Posterior pharynx clear of any exudate or lesions.   Neck: normal, supple, no masses, no thyromegaly.  No evidence of jugular venous distension.   Respiratory: clear to auscultation bilaterally, no wheezing, no crackles. Normal respiratory effort. No accessory muscle use.  Cardiovascular: Regular rate and rhythm, no murmurs / rubs / gallops. No extremity edema. 2+ pedal pulses. No carotid bruits.  Chest:   Diffuse tenderness crepitus or deformity.   Back:   Diffuse tenderness without crepitus or deformity. Abdomen: Abdomen is soft and nontender.  No evidence of intra-abdominal masses.  Positive bowel sounds noted in all quadrants.   Musculoskeletal: Diffuse tenderness of the extremities without associated deformity.  Good ROM, no contractures. Normal muscle tone.  Neurologic: Patient exhibits decreased strength of  the proximal and distal muscle groups of the bilateral lower extremities as well as 4 out of 5 strength of the right upper extremity proximal and distal muscle groups.  CN 2-12 grossly intact. Sensation intact.  Patient moving all 4 extremities spontaneously.  Patient is following all commands.  Patient is responsive to verbal stimuli.   Psychiatric: Patient extremely anxious with labile affect.  Patient seems to possess insight as to their current situation.    Data Reviewed:  I have personally reviewed and interpreted labs, imaging.  Significant findings are:  Lactic acid 2.0 followed by 0.9 Troponin 5 Urinalysis revealing small leukocytes, many bacteria and 5-10 white blood cells per high-powered field COVID-19 PCR testing negative CBC revealing white blood cell count of 16.1, hemoglobin of 13, hematocrit 14.1, platelet count of 402 CXR:  Chest X-ray was personally reviewed.  No evidence of focal infiltrates.  No evidence of pleural effusion.  No evidence of pneumothorax.    EKG: Personally reviewed.  Rhythm is sinus tachycardia with heart rate of 103 bpm.  No dynamic ST segment changes appreciated.   Assessment and Plan: * Acute sickle cell crisis ([place]) Patient reports pain is similar but worse compared to other sickle cell pain crises On my assessment patient's current presentation is extremely pronounced and very atypical.  I am unable to pinpoint any specific area of pain as patient seems to have tenderness over every inch of her body No clinical evidence of acute chest syndrome Initiating high-dose intravenous opiate-based analgesics for now although I anticipate that we will be able to quickly wean this off and discontinue in preparation for discharge Abstaining from any NSAIDs at this time due to mild acute kidney injury Patient will be initiated on intravenous while resuscitation with half-normal saline No clinical evidence of infection thus far Monitoring progression of  disease with serial CBCs, serial reticulocyte counts, serial bilirubins   AKI (acute kidney injury) ([place]) Patient exhibiting evidence of mild acute kidney injury.  Possibly secondary to volume depletion but also possibly secondary to ongoing use of NSAIDs. Hydrating patient with intravenous isotonic fluids. Strict input and output monitoring Monitoring renal function and electrolytes with serial chemistries Avoiding nephrotoxic agents if at all possible   Lactic acidosis Lactic acidosis likely secondary to some degree of  volume depletion Hydrate patient intravenous isotonic fluids while concurrently treating underlying infection Monitoring serial lactic acid levels to ensure downtrending and resolution.   Leukocytosis Chest x-ray unremarkable Urinalysis not particularly suggestive of urinary tract infection Physical exam does not reveal any obvious evidence of infection despite patient complaining of pain and tenderness everywhere. Obtaining procalcitonin and CRP Leukocytosis seemingly chronic based on review of labs  Type 2 diabetes mellitus without complication, with long-term current use of insulin (HCC) Continue home regimen of Humalog 75/25 Patient been placed on Accu-Cheks before every meal and nightly with sliding scale insulin Hemoglobin A1C ordered Diabetic Diet   Essential hypertension Temporarily holding hydrochlorothiazide in the setting of acute kidney injury Will resume antihypertensives as able PRN intravenous antihypertensives for excessively elevated blood pressure         Code Status:  Full code  code status decision has been confirmed with: patient Family Communication: deferred   Consults: None, sickle cell team to evaluate this a.m.  Severity of Illness:  The appropriate patient status for this patient is OBSERVATION. Observation status is judged to be reasonable and necessary in order to provide the required intensity of service to ensure the  patient's safety. The patient's presenting symptoms, physical exam findings, and initial radiographic and laboratory data in the context of their medical condition is felt to place them at decreased risk for further clinical deterioration. Furthermore, it is anticipated that the patient will be medically stable for discharge from the hospital within 2 midnights of admission.   Author:  Vernelle Emerald MD  07/09/2022 8:14 AM

## 2022-07-09 NOTE — ED Notes (Signed)
Pt reminded x 4 that a urine sample is needed. She states she does not have to go.

## 2022-07-09 NOTE — ED Notes (Signed)
The patient is requesting more pain medication. Dr. Randal Buba informed.

## 2022-07-09 NOTE — Assessment & Plan Note (Signed)
.   Patient exhibiting evidence of mild acute kidney injury.  Possibly secondary to volume depletion but also possibly secondary to ongoing use of NSAIDs. Marland Kitchen Hydrating patient with intravenous isotonic fluids. . Strict input and output monitoring . Monitoring renal function and electrolytes with serial chemistries . Avoiding nephrotoxic agents if at all possible

## 2022-07-10 DIAGNOSIS — N179 Acute kidney failure, unspecified: Secondary | ICD-10-CM

## 2022-07-10 LAB — BASIC METABOLIC PANEL
Anion gap: 8 (ref 5–15)
BUN: 15 mg/dL (ref 6–20)
CO2: 27 mmol/L (ref 22–32)
Calcium: 8 mg/dL — ABNORMAL LOW (ref 8.9–10.3)
Chloride: 99 mmol/L (ref 98–111)
Creatinine, Ser: 0.75 mg/dL (ref 0.44–1.00)
GFR, Estimated: >60 mL/min (ref >60)
Glucose, Bld: 197 mg/dL — ABNORMAL HIGH (ref 70–99)
Potassium: 2.7 mmol/L — CL (ref 3.5–5.1)
Sodium: 134 mmol/L — ABNORMAL LOW (ref 135–145)

## 2022-07-10 LAB — CBC
HCT: 34.2 % — ABNORMAL LOW (ref 36.0–46.0)
Hemoglobin: 10.8 g/dL — ABNORMAL LOW (ref 12.0–15.0)
MCH: 23.1 pg — ABNORMAL LOW (ref 26.0–34.0)
MCHC: 31.6 g/dL (ref 30.0–36.0)
MCV: 73.1 fL — ABNORMAL LOW (ref 80.0–100.0)
Platelets: 322 10*3/uL (ref 150–400)
RBC: 4.68 MIL/uL (ref 3.87–5.11)
RDW: 18 % — ABNORMAL HIGH (ref 11.5–15.5)
WBC: 10.7 10*3/uL — ABNORMAL HIGH (ref 4.0–10.5)
nRBC: 0 % (ref 0.0–0.2)

## 2022-07-10 LAB — DIFFERENTIAL
Abs Immature Granulocytes: 0.02 10*3/uL (ref 0.00–0.07)
Basophils Absolute: 0.1 10*3/uL (ref 0.0–0.1)
Basophils Relative: 1 %
Eosinophils Absolute: 0.2 10*3/uL (ref 0.0–0.5)
Eosinophils Relative: 2 %
Immature Granulocytes: 0 %
Lymphocytes Relative: 42 %
Lymphs Abs: 4.6 10*3/uL — ABNORMAL HIGH (ref 0.7–4.0)
Monocytes Absolute: 0.5 10*3/uL (ref 0.1–1.0)
Monocytes Relative: 5 %
Neutro Abs: 5.4 10*3/uL (ref 1.7–7.7)
Neutrophils Relative %: 50 %

## 2022-07-10 LAB — GLUCOSE, CAPILLARY
Glucose-Capillary: 192 mg/dL — ABNORMAL HIGH (ref 70–99)
Glucose-Capillary: 162 mg/dL — ABNORMAL HIGH (ref 70–99)
Glucose-Capillary: 187 mg/dL — ABNORMAL HIGH (ref 70–99)
Glucose-Capillary: 133 mg/dL — ABNORMAL HIGH (ref 70–99)

## 2022-07-10 LAB — TECHNOLOGIST SMEAR REVIEW

## 2022-07-10 MED ORDER — HYDROMORPHONE HCL 1 MG/ML IJ SOLN
1.0000 mg | Freq: Four times a day (QID) | INTRAMUSCULAR | Status: DC | PRN
  Administered 2022-07-10 – 2022-07-13 (×7): 1 mg via SUBCUTANEOUS
  Filled 2022-07-10 (×7): qty 1

## 2022-07-10 MED ORDER — SENNOSIDES-DOCUSATE SODIUM 8.6-50 MG PO TABS
1.0000 | ORAL_TABLET | Freq: Two times a day (BID) | ORAL | Status: DC
  Administered 2022-07-10 – 2022-07-14 (×10): 1 via ORAL
  Filled 2022-07-10 (×10): qty 1

## 2022-07-10 MED ORDER — PREGABALIN 50 MG PO CAPS
50.0000 mg | ORAL_CAPSULE | Freq: Two times a day (BID) | ORAL | Status: DC
  Administered 2022-07-10 – 2022-07-14 (×10): 50 mg via ORAL
  Filled 2022-07-10 (×10): qty 1

## 2022-07-10 MED ORDER — POTASSIUM CHLORIDE CRYS ER 20 MEQ PO TBCR
40.0000 meq | EXTENDED_RELEASE_TABLET | Freq: Once | ORAL | Status: AC
  Administered 2022-07-10: 40 meq via ORAL
  Filled 2022-07-10: qty 2

## 2022-07-10 MED ORDER — POTASSIUM CHLORIDE IN NACL 20-0.45 MEQ/L-% IV SOLN
INTRAVENOUS | Status: DC
  Filled 2022-07-10 (×8): qty 1000

## 2022-07-10 MED ORDER — OXYCODONE HCL 5 MG PO TABS
30.0000 mg | ORAL_TABLET | ORAL | Status: DC | PRN
  Administered 2022-07-10 – 2022-07-14 (×19): 30 mg via ORAL
  Filled 2022-07-10 (×20): qty 6

## 2022-07-10 NOTE — Inpatient Diabetes Management (Signed)
Inpatient Diabetes Program Recommendations  AACE/ADA: New Consensus Statement on Inpatient Glycemic Control (2015)  Target Ranges:  Prepandial:   less than 140 mg/dL      Peak postprandial:   less than 180 mg/dL (1-2 hours)      Critically ill patients:  140 - 180 mg/dL   Lab Results  Component Value Date   GLUCAP 162 (H) 07/10/2022   HGBA1C 8.0 (H) 07/09/2022    Review of Glycemic Control  Diabetes history: DM2 Outpatient Diabetes medications: Humlog 75/25 36 units BID Current orders for Inpatient glycemic control: Novolog 70/30 36 units BID  HgbA1C - 8%  Inpatient Diabetes Program Recommendations:    Add Novolog 0-9 units TID with meals and 0-5 HS  Follow.   Thank you. Lorenda Peck, RD, LDN, [place] Inpatient Diabetes Coordinator [phone]

## 2022-07-10 NOTE — Progress Notes (Cosign Needed Addendum)
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  However, patient does not have sickle cell disease. Hemoglobinopathy on 09/05/2013 showed 97.7% hemoglobin A and no hemoglobin S.  Patient does not have sickle cell disease or sickle cell trait. 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  She states that she relocated to this area from [place], [place] many years ago.  Patient has been treated for chronic pain over the past years.  She is currently on oxycodone 30 mg every 4 hours as needed.  Her home pain medications are managed by her PCP.  Patient states that she has intense pain primarily to her back, and upper extremities that radiates to her fingertips.  She characterizes her pain as burning and shooting.  She states that she is never without pain.  Pain has been worsening since her laminectomy in 2016.  She states that laminectomy was done by Dr. Trenton Gammon and she has not follow-up with his provider over the past 3 years.  Patient's neurologist is Dr.Ahern and she states that she has been lost to follow-up over the past several years.  Patient rates her pain as 10/10 today.  She states that she cannot get comfortable and she endorses some right-sided paralysis that has been present since surgery.  Patient is currently in physical therapy..  On admission, patient's potassium was 2.5, she received 3 runs of KCl without very much improvement.  Today, potassium is 2.7.  Acute kidney injury resolved after rehydration.  No headache, chest pain, shortness of breath, urinary symptoms, or diarrhea.  Patient continues to endorse some nausea. Objective:  Vital signs in  last 24 hours:  Vitals:   07/09/22 2033 07/10/22 0025 07/10/22 0433 07/10/22 1422  BP: (!) 156/75 (!) 152/83 122/89 (!) 155/95  Pulse: 92 99 90 (!) 101  Resp: '20 20 20   '$ Temp: 98.6 F (37 C) 98.7 F (37.1 C) 98.3 F (36.8 C) 98 F (36.7 C)  TempSrc: Oral Oral Oral Oral  SpO2: 100% 97% 98% 97%  Weight:      Height:        Intake/Output from previous day:   Intake/Output Summary (Last 24 hours) at 07/10/2022 1738 Last data filed at 07/10/2022 1121 Gross per 24 hour  Intake 1550.06 ml  Output 710 ml  Net 840.06 ml    Physical Exam: General: Alert, awake, oriented x3, in no acute distress.  Morbid obesity HEENT: [place]/AT PEERL, EOMI Neck: Trachea midline,  no masses, no thyromegal,y no JVD, no carotid bruit OROPHARYNX:  Moist, No exudate/ erythema/lesions.  Heart: Regular rate and rhythm, without murmurs, rubs, gallops, PMI non-displaced, no heaves or thrills on palpation.  Lungs: Clear to auscultation, no wheezing or rhonchi noted. No increased vocal fremitus resonant to percussion  Abdomen: Soft, nontender, nondistended, positive bowel sounds, no masses no hepatosplenomegaly noted..  Neuro: No focal neurological deficits noted cranial nerves II through XII grossly intact. DTRs 2+ bilaterally upper and lower extremities.  Bilateral upper extremity weakness.  Musculoskeletal: No warm swelling or erythema around joints, no spinal tenderness noted. Psychiatric: Patient alert and oriented x3, good insight and cognition, good recent to remote recall. Lymph node survey: No cervical axillary or inguinal lymphadenopathy noted.  Lab Results:  Basic  Metabolic Panel:    Component Value Date/Time   NA 134 (L) 07/10/2022 0859   NA 140 11/19/2014 0933   K 2.7 (LL) 07/10/2022 0859   CL 99 07/10/2022 0859   CO2 27 07/10/2022 0859   BUN 15 07/10/2022 0859   BUN 10 11/19/2014 0933   CREATININE 0.75 07/10/2022 0859   GLUCOSE 197 (H) 07/10/2022 0859   CALCIUM 8.0 (L) 07/10/2022 0859    CBC:    Component Value Date/Time   WBC 10.7 (H) 07/10/2022 0859   HGB 10.8 (L) 07/10/2022 0859   HCT 34.2 (L) 07/10/2022 0859   PLT 322 07/10/2022 0859   MCV 73.1 (L) 07/10/2022 0859   NEUTROABS 5.4 07/10/2022 0859   LYMPHSABS 4.6 (H) 07/10/2022 0859   MONOABS 0.5 07/10/2022 0859   EOSABS 0.2 07/10/2022 0859   BASOSABS 0.1 07/10/2022 0859    Recent Results (from the past 240 hour(s))  SARS Coronavirus 2 by RT PCR (hospital order, performed in [place] hospital lab) *cepheid single result test* Anterior Nasal Swab     Status: None   Collection Time: 07/08/22 11:33 PM   Specimen: Anterior Nasal Swab  Result Value Ref Range Status   SARS Coronavirus 2 by RT PCR NEGATIVE NEGATIVE Final    Comment: (NOTE) SARS-CoV-2 target nucleic acids are NOT DETECTED.  The SARS-CoV-2 RNA is generally detectable in upper and lower respiratory specimens during the acute phase of infection. The lowest concentration of SARS-CoV-2 viral copies this assay can detect is 250 copies / mL. A negative result does not preclude SARS-CoV-2 infection and should not be used as the sole basis for treatment or other patient management decisions.  A negative result may occur with improper specimen collection / handling, submission of specimen other than nasopharyngeal swab, presence of viral mutation(s) within the areas targeted by this assay, and inadequate number of viral copies (<250 copies / mL). A negative result must be combined with clinical observations, patient history, and epidemiological information.  Fact Sheet for Patients:   https://www.patel.info/  Fact Sheet for Healthcare Providers: https://hall.com/  This test is not yet approved or  cleared by the [place] FDA and has been authorized for detection and/or diagnosis of SARS-CoV-2 by FDA under an Emergency Use Authorization (EUA).  This EUA will remain in effect (meaning this test can be  used) for the duration of the COVID-19 declaration under Section 564(b)(1) of the Act, 21 U.S.C. section 360bbb-3(b)(1), unless the authorization is terminated or revoked sooner.  Performed at [facility], [place] [address] [place], [place] [address]   Urine Culture     Status: Abnormal (Preliminary result)   Collection Time: 07/09/22  2:37 AM   Specimen: Urine, Clean Catch  Result Value Ref Range Status   Specimen Description   Final    URINE, CLEAN CATCH Performed at [facility], [place] [address], [place], [place] [address]    Special Requests   Final    NONE Performed at [facility], [place] [address], [place], [place] [address]    Culture (A)  Final    >=100,000 COLONIES/mL ESCHERICHIA COLI SUSCEPTIBILITIES TO FOLLOW Performed at [facility], [place] [address] [place], [place] [address]    Report Status PENDING  Incomplete    Studies/Results: DG Chest 2 View  Result Date: 07/08/2022 CLINICAL DATA:  Chest pain EXAM: CHEST - 2 VIEW COMPARISON:  01/09/2022 FINDINGS: The heart size and mediastinal contours are within normal limits. Both lungs  are clear. The visualized skeletal structures are unremarkable. IMPRESSION: No active cardiopulmonary disease. Electronically Signed   By: Rolm Baptise M.D.   On: 07/08/2022 19:15    Medications: Scheduled Meds:  enoxaparin (LOVENOX) injection  40 mg Subcutaneous Q24H   furosemide  40 mg Oral Daily   insulin aspart protamine- aspart  36 Units Subcutaneous BID WC   pregabalin  50 mg Oral BID   senna-docusate  1 tablet Oral BID   sodium chloride flush  3 mL Intravenous Q12H   Continuous Infusions:  0.45 % NaCl with KCl 20 mEq / L 100 mL/hr at 07/10/22 1117   promethazine (PHENERGAN) injection (IM or IVPB) Stopped (07/09/22 1940)   PRN Meds:.acetaminophen **OR** acetaminophen, diazepam, hydrALAZINE, HYDROmorphone (DILAUDID) injection, ondansetron **OR** [DISCONTINUED] ondansetron  (ZOFRAN) IV, mouth rinse, oxyCODONE, polyethylene glycol, promethazine (PHENERGAN) injection (IM or IVPB)  Consultants: none  Procedures: none  Antibiotics: none  Assessment/Plan: Principal Problem:   AKI (acute kidney injury) ([place]) Active Problems:   Type 2 diabetes mellitus without complication, with long-term current use of insulin (HCC)   Essential hypertension   Lactic acidosis   Leukocytosis   Sickle cell pain crisis ([place])  Acute kidney injury without noted history of CKD: Resolved.  We will continue gentle hydration.  Avoid nephrotoxins.  Hypokalemia: Today, potassium is 2.7.  Replete today.  Add potassium to IV fluids.  BMP in AM.  Type 2 diabetes mellitus: Continue 70/30 every 12 hours at 36 units.  No meal coverage at this time.  Patient is on semaglutide weekly, will continue to hold.  Carbohydrate modified diet in place.  Continue to monitor CBGs.  Essential hypertension: Stable.  Continue home medications.  Chronic pain syndrome: Patient does not have a history of sickle cell disease.  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 She will warrant a hematology follow-up.  She does have a history of a laminectomy with intradural tumor removal.  Patient reports increased pain since this time.  She has been maintained on oxycodone 30 mg every 4 hours as needed, will continue this medication. Patient is complaining of burning, shooting pain.  We will start Lyrica 50 mg every 12 hours.  Patient has attempted gabapentin in the past with failure.  Contacted patient's PCP Juluis Mire, NP who states the patient has never taken Lyrica in the past.   Recommend referral to pain management as an outpatient.  Peripheral neuropathy: Lyrica 50 mg twice daily  Morbid obesity: Patient is sedentary.  She is unable to exercise due to chronic pain.  Discussed the importance of  following a carbohydrate modified diet divided over small meals.   Code Status: Full Code Family Communication: N/A Disposition Plan: Not yet ready for discharge.  Discharge planned for 07/11/2022   Donia Pounds  APRN, MSN, FNP-C Patient [place], [place] [address] [phone]  If 7PM-7AM, please contact night-coverage.  07/10/2022, 5:38 PM  LOS: 1 day

## 2022-07-10 NOTE — Progress Notes (Signed)
Nutrition Brief Note  Patient identified on the Malnutrition Screening Tool (MST) Report  Wt Readings from Last 15 Encounters:  07/09/22 (!) 157.1 kg  07/10/21 (!) 141 kg  12/03/20 (!) 140.6 kg  10/22/20 136.1 kg  06/01/20 (!) 140.6 kg  04/08/20 136.1 kg  12/19/18 (!) 155.1 kg  07/07/18 (!) 142.4 kg  05/09/18 (!) 140.6 kg  04/27/18 (!) 140.6 kg  04/18/18 (!) 140.6 kg  04/05/18 (!) 138.3 kg  01/10/18 (!) 138.3 kg  11/15/17 (!) 137.4 kg  08/10/17 (!) 140.6 kg   Pt with past medical history of sickle cell disease ( disease), hypertension, insulin-dependent diabetes mellitus type 2 presenting with complaints of diffuse pain.  Pt admitted with acute sickle cell crisis.   Pt unavailable at time of visit. Attempted to speak with pt via call to hospital room phone, however, unable to reach. RD unable to obtain further nutrition-related history or complete nutrition-focused physical exam at this time.    Medications reviewed and include lasix, potassium chloride, senokot, and 0.45% NaCl with KCl 20 mEq/L infusion @ 100 ml/hr.  Lab Results  Component Value Date   HGBA1C 8.0 (H) 07/09/2022    PTA DM medications are 36 units insulin aspart protamine-aspart BID.   Labs reviewed: CBGS: [phone] (inpatient orders for glycemic control are 36 units insulin aspart protamine-aspart BID. ).    Body mass index is 61.37 kg/m. Patient meets criteria for obesity, class III based on current BMI. Obesity is a complex, chronic medical condition that is optimally managed by a multidisciplinary care team. Weight loss is not an ideal goal for an acute inpatient hospitalization. However, if further work-up for obesity is warranted, consider outpatient referral to outpatient bariatric service and/or [place] Nutrition [facility] Diabetes Education [facility].    Current diet order is carb modified, patient is consuming approximately n/a% of meals at this time. Labs and medications reviewed.   No nutrition  interventions warranted at this time. If nutrition issues arise, please consult RD.   Loistine Chance, RD, LDN, [place] Registered Dietitian II Certified Diabetes Care and Education Specialist Please refer to [facility] for RD and/or RD on-call/weekend/after hours pager

## 2022-07-10 NOTE — TOC Progression Note (Signed)
Transition of Care [facility]) - Progression Note    Patient Details  Name: JALIYA SIEGMANN MRN: [id number] Date of Birth: 1981/05/26  Transition of Care [facility]) CM/SW Contact  Servando Snare, [place] Phone Number: 07/10/2022, 11:16 AM  Clinical Narrative:       Transition of Care [facility]) Screening Note   Patient Details  Name: MIKALYN HERMIDA Date of Birth: 07/23/1981   Transition of Care [facility]) CM/SW Contact:    Servando Snare, LCSW Phone Number: 07/10/2022, 11:16 AM    Transition of Care Department [facility]) has reviewed patient and no TOC needs have been identified at this time. We will continue to monitor patient advancement through interdisciplinary progression rounds. If new patient transition needs arise, please place a TOC consult.        Expected Discharge Plan and Services                                                 Social Determinants of Health (SDOH) Interventions    Readmission Risk Interventions     No data to display

## 2022-07-11 ENCOUNTER — Inpatient Hospital Stay (HOSPITAL_COMMUNITY): Payer: Medicare Other

## 2022-07-11 DIAGNOSIS — N179 Acute kidney failure, unspecified: Secondary | ICD-10-CM | POA: Diagnosis not present

## 2022-07-11 LAB — GLUCOSE, CAPILLARY
Glucose-Capillary: 163 mg/dL — ABNORMAL HIGH (ref 70–99)
Glucose-Capillary: 78 mg/dL (ref 70–99)
Glucose-Capillary: 106 mg/dL — ABNORMAL HIGH (ref 70–99)
Glucose-Capillary: 75 mg/dL (ref 70–99)

## 2022-07-11 LAB — BASIC METABOLIC PANEL
Anion gap: 8 (ref 5–15)
BUN: 17 mg/dL (ref 6–20)
CO2: 27 mmol/L (ref 22–32)
Calcium: 8.3 mg/dL — ABNORMAL LOW (ref 8.9–10.3)
Chloride: 104 mmol/L (ref 98–111)
Creatinine, Ser: 0.87 mg/dL (ref 0.44–1.00)
GFR, Estimated: >60 mL/min (ref >60)
Glucose, Bld: 151 mg/dL — ABNORMAL HIGH (ref 70–99)
Potassium: 3.4 mmol/L — ABNORMAL LOW (ref 3.5–5.1)
Sodium: 139 mmol/L (ref 135–145)

## 2022-07-11 LAB — URINE CULTURE: Culture: >=100000 — AB

## 2022-07-11 MED ORDER — GADOBUTROL 1 MMOL/ML IV SOLN
10.0000 mL | Freq: Once | INTRAVENOUS | Status: AC | PRN
  Administered 2022-07-11: 10 mL via INTRAVENOUS

## 2022-07-11 NOTE — Progress Notes (Signed)
SICKLE CELL SERVICE PROGRESS NOTE  Cynthia Rosales QQP:[id number] DOB: 1981-10-14 DOA: 07/08/2022 PCP: Elwyn Reach, MD  Assessment/Plan: Principal Problem:   AKI (acute kidney injury) ([place]) Active Problems:   Lactic acidosis   Leukocytosis   Type 2 diabetes mellitus without complication, with long-term current use of insulin (HCC)   Essential hypertension   Sickle cell pain crisis ([place])  Chronic pain syndrome: Patient has no sickle cell disease but has chronic pain due to significant back pain.  She did have some back tomorrow that required excision of surgery previously.  We will order new MRI of the spine as previously recommended by Dr. Trenton Gammon.  This will hopefully explain the cause of patient's ongoing chronic pain.  Patient will need to follow-up with the pain clinic at discharge.  PCP already notified. AKI: This is resolved Hypokalemia: Replete potassium Type 2 diabetes: Blood sugar much better.  Continue sliding scale insulin Essential hypertension: Continue blood pressure medications.  Code Status: Full code Family Communication: No family at bedside Disposition Plan: Home when ready  [facility]  Pager [phone]. If 7PM-7AM, please contact night-coverage.  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  However, patient does not have sickle cell disease. Hemoglobinopathy on 09/05/2013 showed 97.7% hemoglobin A and no hemoglobin S.  Patient does not have sickle cell disease or sickle cell trait. 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  She states that she relocated to this area from  [place], [place] many years ago.   Patient has been treated for chronic pain over the past years.  She is currently on oxycodone 30 mg every 4 hours as needed.  Her home pain medications are managed by her PCP.  Patient states that she has intense pain primarily to her back, and upper extremities that radiates to her fingertips.  She characterizes her pain as burning and shooting.  She states that she is never without pain.  Pain has been worsening since her laminectomy in 2016.  She states that laminectomy was done by Dr. Trenton Gammon and she has not follow-up with his provider over the past 3 years.  Patient's neurologist is Dr.Ahern and she states that she has been lost to follow-up over the past several years.  Consultants:  None.  Patient follows up with Dr. Trenton Gammon in the outpatient setting.  Procedures: We will order MRI of the T and L-spine  Antibiotics: None  HPI/Subjective: Patient reports ongoing pain rated as 10 out of 10.  Unable to move in bed.  She appears to have significant history of chronic pain syndrome.  She additionally has what appears to be history of previous spine surgery.  She reported that there was a plan for possible MRI in the outpatient setting prior to admission by Dr. Trenton Gammon who has apparently been taking care of her.  Objective: Vitals:   07/10/22 0433 07/10/22 1422 07/10/22 2108 07/11/22 0443  BP: 122/89 (!) 155/95 (!) 145/84 127/88  Pulse: 90 (!) 101 99 93  Resp: '20  18 18  '$ Temp: 98.3 F (36.8 C) 98 F (36.7 C) 98.5 F (36.9 C) 97.8 F (36.6 C)  TempSrc: Oral Oral Oral Oral  SpO2: 98% 97% 94% 98%  Weight:      Height:       Weight change:   Intake/Output Summary (Last 24 hours) at 07/11/2022 1205 Last data filed at 07/11/2022 1012 Gross per 24 hour  Intake 2336.33 ml  Output 1850 ml  Net 486.33 ml    General: Alert, awake, oriented x3, in no acute distress.  HEENT: [place]/AT PEERL, EOMI Neck: Trachea midline,  no masses, no thyromegal,y no JVD,  no carotid bruit OROPHARYNX:  Moist, No exudate/ erythema/lesions.  Heart: Regular rate and rhythm, without murmurs, rubs, gallops, PMI non-displaced, no heaves or thrills on palpation.  Lungs: Clear to auscultation, no wheezing or rhonchi noted. No increased vocal fremitus resonant to percussion  Abdomen: Soft, nontender, nondistended, positive bowel sounds, no masses no hepatosplenomegaly noted..  Neuro: No focal neurological deficits noted cranial nerves II through XII grossly intact. DTRs 2+ bilaterally upper and lower extremities. Strength 5 out of 5 in bilateral upper and lower extremities. Musculoskeletal: Tenderness in the mid back Psychiatric: Patient alert and oriented x3, good insight and cognition, good recent to remote recall. Lymph node survey: No cervical axillary or inguinal lymphadenopathy noted.   Data Reviewed: Basic Metabolic Panel: Recent Labs  Lab 07/08/22 2122 07/09/22 0700 07/10/22 0859 07/11/22 0550  NA 141 137 134* 139  K 3.6 2.5* 2.7* 3.4*  CL 99 97* 99 104  CO2 '28 28 27 27  '$ GLUCOSE 104* 171* 197* 151*  BUN 27* 27* 15 17  CREATININE 1.42* 1.17* 0.75 0.87  CALCIUM 9.4 8.2* 8.0* 8.3*  MG  --  1.7  --   --    Liver Function Tests: Recent Labs  Lab 07/08/22 2122 07/09/22 0700  AST 17 14*  ALT 16 14  ALKPHOS 86 72  BILITOT 0.5 0.5  PROT 9.1* 7.7  ALBUMIN 3.8 3.3*   No results for input(s): "LIPASE", "AMYLASE" in the last 168 hours. No results for input(s): "AMMONIA" in the last 168 hours. CBC: Recent Labs  Lab 07/08/22 2122 07/09/22 0700 07/10/22 0859  WBC 16.1* 10.8* 10.7*  NEUTROABS 10.5* 7.4 5.4  HGB 13.0 11.0* 10.8*  HCT 41.1 35.1* 34.2*  MCV 72.2* 72.5* 73.1*  PLT 402* 347 322   Cardiac Enzymes: No results for input(s): "CKTOTAL", "CKMB", "CKMBINDEX", "TROPONINI" in the last 168 hours. BNP (last 3 results) Recent Labs    01/09/22 0330  BNP 19.9    ProBNP (last 3 results) No results for input(s): "PROBNP" in the last 8760  hours.  CBG: Recent Labs  Lab 07/10/22 1116 07/10/22 1645 07/10/22 2109 07/11/22 0748 07/11/22 1132  GLUCAP 162* 187* 133* 163* 78    Recent Results (from the past 240 hour(s))  SARS Coronavirus 2 by RT PCR (hospital order, performed in [facility] hospital lab) *cepheid single result test* Anterior Nasal Swab     Status: None   Collection Time: 07/08/22 11:33 PM   Specimen: Anterior Nasal Swab  Result Value Ref Range Status   SARS Coronavirus 2 by RT PCR NEGATIVE NEGATIVE Final    Comment: (NOTE) SARS-CoV-2 target nucleic acids are NOT DETECTED.  The SARS-CoV-2 RNA is generally detectable in upper and lower respiratory specimens during the acute phase of infection. The lowest concentration of SARS-CoV-2 viral copies this assay can detect is 250 copies / mL. A negative result does not preclude SARS-CoV-2 infection and should not be used as the sole basis for treatment or other patient management decisions.  A negative result may occur with improper  specimen collection / handling, submission of specimen other than nasopharyngeal swab, presence of viral mutation(s) within the areas targeted by this assay, and inadequate number of viral copies (<250 copies / mL). A negative result must be combined with clinical observations, patient history, and epidemiological information.  Fact Sheet for Patients:   https://www.patel.info/  Fact Sheet for Healthcare Providers: https://hall.com/  This test is not yet approved or  cleared by the [place] FDA and has been authorized for detection and/or diagnosis of SARS-CoV-2 by FDA under an Emergency Use Authorization (EUA).  This EUA will remain in effect (meaning this test can be used) for the duration of the COVID-19 declaration under Section 564(b)(1) of the Act, 21 U.S.C. section 360bbb-3(b)(1), unless the authorization is terminated or revoked sooner.  Performed at [facility], [place] [address], [place], [place] [address]   Urine Culture     Status: Abnormal   Collection Time: 07/09/22  2:37 AM   Specimen: Urine, Clean Catch  Result Value Ref Range Status   Specimen Description   Final    URINE, CLEAN CATCH Performed at [facility], [place] [address], [place], [place] [address]    Special Requests   Final    NONE Performed at [facility], [place] [address], [place], [place] [address]    Culture >=100,000 COLONIES/mL ESCHERICHIA COLI (A)  Final   Report Status 07/11/2022 FINAL  Final   Organism ID, Bacteria ESCHERICHIA COLI (A)  Final      Susceptibility   Escherichia coli - MIC*    AMPICILLIN <=2 SENSITIVE Sensitive     CEFAZOLIN <=4 SENSITIVE Sensitive     CEFEPIME <=0.12 SENSITIVE Sensitive     CEFTRIAXONE <=0.25 SENSITIVE Sensitive     CIPROFLOXACIN <=0.25 SENSITIVE Sensitive     GENTAMICIN <=1 SENSITIVE Sensitive     IMIPENEM <=0.25 SENSITIVE Sensitive     NITROFURANTOIN <=16 SENSITIVE Sensitive     TRIMETH/SULFA <=20 SENSITIVE Sensitive     AMPICILLIN/SULBACTAM <=2 SENSITIVE Sensitive     PIP/TAZO <=4 SENSITIVE Sensitive     * >=100,000 COLONIES/mL ESCHERICHIA COLI     Studies: DG Chest 2 View  Result Date: 07/08/2022 CLINICAL DATA:  Chest pain EXAM: CHEST - 2 VIEW COMPARISON:  01/09/2022 FINDINGS: The heart size and mediastinal contours are within normal limits. Both lungs are clear. The visualized skeletal structures are unremarkable. IMPRESSION: No active cardiopulmonary disease. Electronically Signed   By: Rolm Baptise M.D.   On: 07/08/2022 19:15    Scheduled Meds:  enoxaparin (LOVENOX) injection  40 mg Subcutaneous Q24H   furosemide  40 mg Oral Daily   insulin aspart protamine- aspart  36 Units Subcutaneous BID WC   pregabalin  50 mg Oral BID   senna-docusate  1 tablet Oral BID   sodium chloride flush  3 mL Intravenous Q12H   Continuous Infusions:  0.45 % NaCl with KCl 20 mEq / L  100 mL/hr at 07/11/22 0714   promethazine (PHENERGAN) injection (IM or IVPB) Stopped (07/09/22 1940)    Principal Problem:   AKI (acute kidney injury) ([place]) Active Problems:   Lactic acidosis   Leukocytosis   Type 2 diabetes mellitus without complication, with long-term current use of insulin (HCC)   Essential hypertension   Sickle cell pain crisis ([place])

## 2022-07-11 NOTE — Plan of Care (Signed)
  Problem: Pain Managment: Goal: General experience of comfort will improve Outcome: Progressing   Problem: Safety: Goal: Ability to remain free from injury will improve Outcome: Progressing   

## 2022-07-12 DIAGNOSIS — N179 Acute kidney failure, unspecified: Secondary | ICD-10-CM | POA: Diagnosis not present

## 2022-07-12 LAB — CBC WITH DIFFERENTIAL/PLATELET
Abs Immature Granulocytes: 0.19 10*3/uL — ABNORMAL HIGH (ref 0.00–0.07)
Basophils Absolute: 0.1 10*3/uL (ref 0.0–0.1)
Basophils Relative: 0 %
Eosinophils Absolute: 0.2 10*3/uL (ref 0.0–0.5)
Eosinophils Relative: 2 %
HCT: 35 % — ABNORMAL LOW (ref 36.0–46.0)
Hemoglobin: 11 g/dL — ABNORMAL LOW (ref 12.0–15.0)
Immature Granulocytes: 2 %
Lymphocytes Relative: 30 %
Lymphs Abs: 3.8 10*3/uL (ref 0.7–4.0)
MCH: 23.3 pg — ABNORMAL LOW (ref 26.0–34.0)
MCHC: 31.4 g/dL (ref 30.0–36.0)
MCV: 74.2 fL — ABNORMAL LOW (ref 80.0–100.0)
Monocytes Absolute: 0.5 10*3/uL (ref 0.1–1.0)
Monocytes Relative: 4 %
Neutro Abs: 7.9 10*3/uL — ABNORMAL HIGH (ref 1.7–7.7)
Neutrophils Relative %: 62 %
Platelets: 370 10*3/uL (ref 150–400)
RBC: 4.72 MIL/uL (ref 3.87–5.11)
RDW: 18.6 % — ABNORMAL HIGH (ref 11.5–15.5)
WBC: 12.5 10*3/uL — ABNORMAL HIGH (ref 4.0–10.5)
nRBC: 0 % (ref 0.0–0.2)

## 2022-07-12 LAB — COMPREHENSIVE METABOLIC PANEL
ALT: 13 U/L (ref 0–44)
AST: 15 U/L (ref 15–41)
Albumin: 3 g/dL — ABNORMAL LOW (ref 3.5–5.0)
Alkaline Phosphatase: 72 U/L (ref 38–126)
Anion gap: 7 (ref 5–15)
BUN: 13 mg/dL (ref 6–20)
CO2: 26 mmol/L (ref 22–32)
Calcium: 8.5 mg/dL — ABNORMAL LOW (ref 8.9–10.3)
Chloride: 107 mmol/L (ref 98–111)
Creatinine, Ser: 0.85 mg/dL (ref 0.44–1.00)
GFR, Estimated: >60 mL/min (ref >60)
Glucose, Bld: 99 mg/dL (ref 70–99)
Potassium: 4 mmol/L (ref 3.5–5.1)
Sodium: 140 mmol/L (ref 135–145)
Total Bilirubin: 0.2 mg/dL — ABNORMAL LOW (ref 0.3–1.2)
Total Protein: 7.1 g/dL (ref 6.5–8.1)

## 2022-07-12 LAB — GLUCOSE, CAPILLARY
Glucose-Capillary: 86 mg/dL (ref 70–99)
Glucose-Capillary: 90 mg/dL (ref 70–99)
Glucose-Capillary: 135 mg/dL — ABNORMAL HIGH (ref 70–99)
Glucose-Capillary: 97 mg/dL (ref 70–99)

## 2022-07-12 MED ORDER — ENOXAPARIN SODIUM 80 MG/0.8ML IJ SOSY
75.0000 mg | PREFILLED_SYRINGE | INTRAMUSCULAR | Status: DC
  Administered 2022-07-13 – 2022-07-14 (×2): 75 mg via SUBCUTANEOUS
  Filled 2022-07-12 (×2): qty 0.8

## 2022-07-12 NOTE — Progress Notes (Signed)
SICKLE CELL SERVICE PROGRESS NOTE  [name] BMW:[id number] DOB: 10/06/1981 DOA: 07/08/2022 PCP: Elwyn Reach, MD  Assessment/Plan: Principal Problem:   AKI (acute kidney injury) ([place]) Active Problems:   Lactic acidosis   Leukocytosis   Type 2 diabetes mellitus without complication, with long-term current use of insulin (HCC)   Essential hypertension   Sickle cell pain crisis ([place])  Chronic pain syndrome: Patient has no sickle cell disease but has chronic pain due to significant back pain.  MRI of the thoracic and lumbar spine performed yesterday possibly L4-L5 inflammation but no significant finding to explain the degree of her pain.  Discussed with the patient that she will need physical therapy and referral to pain management clinic.  I will order PT for assistance and discharge plan.  Patient will need to follow-up with the pain clinic at discharge.  PCP already notified. AKI: This is resolved Hypokalemia: Replete potassium Type 2 diabetes: Blood sugar much better.  Continue sliding scale insulin Essential hypertension: Continue blood pressure medications.  Code Status: Full code Family Communication: No family at bedside Disposition Plan: Home when ready  [facility]  Pager [phone]. If 7PM-7AM, please contact night-coverage.  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  However, patient does not have sickle cell disease. Hemoglobinopathy on 09/05/2013 showed 97.7% hemoglobin A and no hemoglobin S.  Patient does not have sickle cell disease or sickle cell trait. 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  She states that she relocated to this area from [place], [place] many years ago.   Patient has been treated for chronic pain over the past years.  She is currently on oxycodone 30 mg every 4 hours as needed.  Her home pain medications are managed by her PCP.  Patient states that she has intense pain primarily to her back, and upper extremities that radiates to her fingertips.  She characterizes her pain as burning and shooting.  She states that she is never without pain.  Pain has been worsening since her laminectomy in 2016.  She states that laminectomy was done by Dr. Trenton Gammon and she has not follow-up with his provider over the past 3 years.  Patient's neurologist is Dr.Ahern and she states that she has been lost to follow-up over the past several years.  Consultants:  None.  Patient follows up with Dr. Trenton Gammon in the outpatient setting.  Procedures: We will order MRI of the T and L-spine  Antibiotics: None  HPI/Subjective: Patient reports ongoing pain rated as 10 out of 10.  Unable to move in bed.  She she has had MRI of the L and T-spine and results discussed with the patient.  Her pain is therefore mainly chronic pain.  Objective: Vitals:   07/11/22 0443 07/11/22 1345 07/11/22 2022 07/12/22 0505  BP: 127/88 131/69 118/74 (!) 140/71  Pulse: 93 89 84 84  Resp: '18 18 17 14  '$ Temp: 97.8 F (36.6 C) 98.4 F (36.9 C) 98.3 F (36.8 C) 98.1 F (36.7 C)  TempSrc: Oral Oral Oral Oral  SpO2: 98% 98% 100% 100%  Weight:      Height:  Weight change:   Intake/Output Summary (Last 24 hours) at 07/12/2022 1043 Last data filed at 07/12/2022 0701 Gross per 24 hour  Intake 2880.7 ml  Output 1200 ml  Net 1680.7 ml     General: Alert, awake, oriented x3, in no acute distress.  HEENT: [place]/AT PEERL, EOMI Neck: Trachea midline,  no masses, no thyromegal,y no JVD, no carotid bruit OROPHARYNX:  Moist, No exudate/ erythema/lesions.  Heart: Regular rate and rhythm,  without murmurs, rubs, gallops, PMI non-displaced, no heaves or thrills on palpation.  Lungs: Clear to auscultation, no wheezing or rhonchi noted. No increased vocal fremitus resonant to percussion  Abdomen: Soft, nontender, nondistended, positive bowel sounds, no masses no hepatosplenomegaly noted..  Neuro: No focal neurological deficits noted cranial nerves II through XII grossly intact. DTRs 2+ bilaterally upper and lower extremities. Strength 5 out of 5 in bilateral upper and lower extremities. Musculoskeletal: Tenderness in the mid back Psychiatric: Patient alert and oriented x3, good insight and cognition, good recent to remote recall. Lymph node survey: No cervical axillary or inguinal lymphadenopathy noted.   Data Reviewed: Basic Metabolic Panel: Recent Labs  Lab 07/08/22 2122 07/09/22 0700 07/10/22 0859 07/11/22 0550  NA 141 137 134* 139  K 3.6 2.5* 2.7* 3.4*  CL 99 97* 99 104  CO2 '28 28 27 27  '$ GLUCOSE 104* 171* 197* 151*  BUN 27* 27* 15 17  CREATININE 1.42* 1.17* 0.75 0.87  CALCIUM 9.4 8.2* 8.0* 8.3*  MG  --  1.7  --   --     Liver Function Tests: Recent Labs  Lab 07/08/22 2122 07/09/22 0700  AST 17 14*  ALT 16 14  ALKPHOS 86 72  BILITOT 0.5 0.5  PROT 9.1* 7.7  ALBUMIN 3.8 3.3*    No results for input(s): "LIPASE", "AMYLASE" in the last 168 hours. No results for input(s): "AMMONIA" in the last 168 hours. CBC: Recent Labs  Lab 07/08/22 2122 07/09/22 0700 07/10/22 0859  WBC 16.1* 10.8* 10.7*  NEUTROABS 10.5* 7.4 5.4  HGB 13.0 11.0* 10.8*  HCT 41.1 35.1* 34.2*  MCV 72.2* 72.5* 73.1*  PLT 402* 347 322    Cardiac Enzymes: No results for input(s): "CKTOTAL", "CKMB", "CKMBINDEX", "TROPONINI" in the last 168 hours. BNP (last 3 results) Recent Labs    01/09/22 0330  BNP 19.9     ProBNP (last 3 results) No results for input(s): "PROBNP" in the last 8760 hours.  CBG: Recent Labs  Lab 07/11/22 0748 07/11/22 1132 07/11/22 1631 07/11/22 2031  07/12/22 0753  GLUCAP 163* 78 106* 75 86     Recent Results (from the past 240 hour(s))  SARS Coronavirus 2 by RT PCR (hospital order, performed in [facility] hospital lab) *cepheid single result test* Anterior Nasal Swab     Status: None   Collection Time: 07/08/22 11:33 PM   Specimen: Anterior Nasal Swab  Result Value Ref Range Status   SARS Coronavirus 2 by RT PCR NEGATIVE NEGATIVE Final    Comment: (NOTE) SARS-CoV-2 target nucleic acids are NOT DETECTED.  The SARS-CoV-2 RNA is generally detectable in upper and lower respiratory specimens during the acute phase of infection. The lowest concentration of SARS-CoV-2 viral copies this assay can detect is 250 copies / mL. A negative result does not preclude SARS-CoV-2 infection and should not be used as the sole basis for treatment or other patient management decisions.  A negative result may occur with improper specimen collection / handling, submission of specimen other than nasopharyngeal swab, presence of  viral mutation(s) within the areas targeted by this assay, and inadequate number of viral copies (<250 copies / mL). A negative result must be combined with clinical observations, patient history, and epidemiological information.  Fact Sheet for Patients:   https://www.patel.info/  Fact Sheet for Healthcare Providers: https://hall.com/  This test is not yet approved or  cleared by the [place] FDA and has been authorized for detection and/or diagnosis of SARS-CoV-2 by FDA under an Emergency Use Authorization (EUA).  This EUA will remain in effect (meaning this test can be used) for the duration of the COVID-19 declaration under Section 564(b)(1) of the Act, 21 U.S.C. section 360bbb-3(b)(1), unless the authorization is terminated or revoked sooner.  Performed at [facility], [place] [address] [place], [place] [address]   Urine Culture     Status: Abnormal    Collection Time: 07/09/22  2:37 AM   Specimen: Urine, Clean Catch  Result Value Ref Range Status   Specimen Description   Final    URINE, CLEAN CATCH Performed at [facility], [place] [address], [place], [place] [address]    Special Requests   Final    NONE Performed at [facility], [place] [address] [place], [place] [address]    Culture >=100,000 COLONIES/mL ESCHERICHIA COLI (A)  Final   Report Status 07/11/2022 FINAL  Final   Organism ID, Bacteria ESCHERICHIA COLI (A)  Final      Susceptibility   Escherichia coli - MIC*    AMPICILLIN <=2 SENSITIVE Sensitive     CEFAZOLIN <=4 SENSITIVE Sensitive     CEFEPIME <=0.12 SENSITIVE Sensitive     CEFTRIAXONE <=0.25 SENSITIVE Sensitive     CIPROFLOXACIN <=0.25 SENSITIVE Sensitive     GENTAMICIN <=1 SENSITIVE Sensitive     IMIPENEM <=0.25 SENSITIVE Sensitive     NITROFURANTOIN <=16 SENSITIVE Sensitive     TRIMETH/SULFA <=20 SENSITIVE Sensitive     AMPICILLIN/SULBACTAM <=2 SENSITIVE Sensitive     PIP/TAZO <=4 SENSITIVE Sensitive     * >=100,000 COLONIES/mL ESCHERICHIA COLI     Studies: MR Lumbar Spine W Wo Contrast  Result Date: 07/12/2022 CLINICAL DATA:  Initial evaluation for back trauma, abnormal neuro exam. EXAM: MRI LUMBAR SPINE WITHOUT AND WITH CONTRAST TECHNIQUE: Multiplanar and multiecho pulse sequences of the lumbar spine were obtained without and with intravenous contrast. CONTRAST:  37m GADAVIST GADOBUTROL 1 MMOL/ML IV SOLN COMPARISON:  Prior study from 08/11/2019. FINDINGS: Segmentation: Standard. Lowest well-formed disc space labeled the L5-S1 level. Alignment: Physiologic with preservation of the normal lumbar lordosis. No listhesis. Vertebrae: Vertebral body height well maintained without acute or chronic fracture. Bone marrow signal intensity diffusely decreased on T1 weighted sequence, nonspecific, but most commonly related to anemia, smoking or obesity. No discrete or worrisome osseous  lesions. Mild reactive marrow edema and enhancement present about the right greater than left L4-5 facets due to facet arthritis. No other abnormal marrow edema. Conus medullaris and cauda equina: Conus extends to the L1 level. Conus and cauda equina appear normal. No abnormal enhancement. Paraspinal and other soft tissues: Unremarkable. Disc levels: L1-2:  Unremarkable. L2-3:  Unremarkable. L3-4:  Unremarkable. L4-5: Normal interspace. Right greater than left facet hypertrophy. Associated reactive marrow edema with small bilateral joint effusions, right greater than left. No spinal stenosis. Foramina remain patent. L5-S1: Disc desiccation. Shallow broad-based central disc protrusion with slight cephalad angulation. Associated enhancing annular fissure. Mild facet hypertrophy. Epidural lipomatosis. No significant canal or lateral recess stenosis. Foramina remain patent. IMPRESSION: 1.  No evidence for acute traumatic injury within the lumbar spine. 2. Reactive marrow edema and enhancement about the right greater than left L4-5 facets due to facet arthritis. Finding could serve as a source for back pain. 3. Shallow central disc protrusion at L5-S1 without significant stenosis or neural impingement. Electronically Signed   By: Jeannine Boga M.D.   On: 07/12/2022 05:38   MR THORACIC SPINE W WO CONTRAST  Result Date: 07/12/2022 CLINICAL DATA:  Initial evaluation for back trauma, abnormal neuro exam. EXAM: MRI THORACIC WITHOUT AND WITH CONTRAST TECHNIQUE: Multiplanar and multiecho pulse sequences of the thoracic spine were obtained without and with intravenous contrast. CONTRAST:  4m GADAVIST GADOBUTROL 1 MMOL/ML IV SOLN COMPARISON:  None available. FINDINGS: Alignment: Straightening of the normal thoracic kyphosis with underlying mild dextroscoliosis. No listhesis. Vertebrae: Vertebral body height maintained without acute or chronic fracture. Bone marrow signal intensity diffusely decreased on T1 weighted  sequence, nonspecific, but most commonly related to anemia, smoking or obesity. Subcentimeter benign hemangioma in the T8 vertebral body. No other discrete or worrisome osseous lesions. No abnormal marrow edema or enhancement. Cord: Normal signal and morphology. Diffuse prominence of the dorsal epidural fat noted. Paraspinal and other soft tissues: Unremarkable. Disc levels: T7-8: Mild disc bulge with disc desiccation. Superimposed small left paracentral disc protrusion mildly indents the left ventral thecal sac (series 19, image 23). Minimal cord flattening without cord signal changes. No significant spinal stenosis. Foramina remain patent. T8-9: Central disc protrusion indents the ventral thecal sac (series 17, image 9). Probable mild cord flattening without cord signal changes. No significant spinal stenosis. Foramina remain patent. Otherwise, no other significant disc pathology seen within the thoracic spine. Intervertebral discs are otherwise well hydrated with preserved disc height. No other focal disc protrusion. No significant facet pathology. No significant spinal stenosis. Foramina remain patent. IMPRESSION: 1. No acute abnormality identified within the thoracic spine or spinal cord. 2. Small disc protrusions at T7-8 and T8-9 with mild cord flattening, but no cord signal changes or significant spinal stenosis. Electronically Signed   By: BJeannine BogaM.D.   On: 07/12/2022 05:24   DG Chest 2 View  Result Date: 07/08/2022 CLINICAL DATA:  Chest pain EXAM: CHEST - 2 VIEW COMPARISON:  01/09/2022 FINDINGS: The heart size and mediastinal contours are within normal limits. Both lungs are clear. The visualized skeletal structures are unremarkable. IMPRESSION: No active cardiopulmonary disease. Electronically Signed   By: KRolm BaptiseM.D.   On: 07/08/2022 19:15    Scheduled Meds:  enoxaparin (LOVENOX) injection  40 mg Subcutaneous Q24H   furosemide  40 mg Oral Daily   insulin aspart protamine-  aspart  36 Units Subcutaneous BID WC   pregabalin  50 mg Oral BID   senna-docusate  1 tablet Oral BID   sodium chloride flush  3 mL Intravenous Q12H   Continuous Infusions:  0.45 % NaCl with KCl 20 mEq / L 100 mL/hr at 07/12/22 0701   promethazine (PHENERGAN) injection (IM or IVPB) Stopped (07/09/22 1940)    Principal Problem:   AKI ([place] kidney injury) ([place] Active Problems:   Lactic acidosis   Leukocytosis   Type 2 diabetes mellitus without complication, with long-term current use of insulin (HCC)   Essential hypertension   S[place] cell pain crisis ([place]

## 2022-07-13 ENCOUNTER — Ambulatory Visit: Payer: Medicare Other | Admitting: Physical Therapy

## 2022-07-13 DIAGNOSIS — N179 Acute kidney failure, unspecified: Secondary | ICD-10-CM | POA: Diagnosis not present

## 2022-07-13 LAB — HGB FRACTIONATION CASCADE
Hgb A2: 2.2 % (ref 1.8–3.2)
Hgb A: 97.8 % (ref 96.4–98.8)
Hgb F: 0 % (ref 0.0–2.0)
Hgb S: 0 %

## 2022-07-13 LAB — GLUCOSE, CAPILLARY
Glucose-Capillary: 125 mg/dL — ABNORMAL HIGH (ref 70–99)
Glucose-Capillary: 137 mg/dL — ABNORMAL HIGH (ref 70–99)
Glucose-Capillary: 134 mg/dL — ABNORMAL HIGH (ref 70–99)
Glucose-Capillary: 168 mg/dL — ABNORMAL HIGH (ref 70–99)

## 2022-07-13 MED ORDER — METHYLPREDNISOLONE SODIUM SUCC 125 MG IJ SOLR
125.0000 mg | Freq: Every day | INTRAMUSCULAR | Status: AC
  Administered 2022-07-13 – 2022-07-14 (×2): 125 mg via INTRAVENOUS
  Filled 2022-07-13 (×2): qty 2

## 2022-07-13 MED ORDER — HYDROMORPHONE HCL 2 MG/ML IJ SOLN
2.0000 mg | Freq: Once | INTRAMUSCULAR | Status: AC
  Administered 2022-07-13: 2 mg via INTRAVENOUS
  Filled 2022-07-13: qty 1

## 2022-07-13 MED ORDER — SODIUM CHLORIDE 0.9% FLUSH
10.0000 mL | Freq: Two times a day (BID) | INTRAVENOUS | Status: DC
  Administered 2022-07-13 – 2022-07-14 (×2): 10 mL

## 2022-07-13 MED ORDER — HYDROMORPHONE HCL 1 MG/ML IJ SOLN
1.0000 mg | Freq: Four times a day (QID) | INTRAMUSCULAR | Status: DC | PRN
  Administered 2022-07-13 – 2022-07-14 (×2): 1 mg via INTRAVENOUS
  Filled 2022-07-13 (×2): qty 1

## 2022-07-13 MED ORDER — SODIUM CHLORIDE 0.9% FLUSH: 10.0000 mL | INTRAVENOUS | Status: DC | PRN

## 2022-07-13 NOTE — Progress Notes (Deleted)
mi

## 2022-07-13 NOTE — Progress Notes (Signed)
PT Cancellation Note  Patient Details Name: JULIANI LADUKE MRN: [id number] DOB: [birth year]   Cancelled Treatment:    Reason Eval/Treat Not Completed: Pain limiting ability to participate Pt receiving pain meds at time of introduction yet pt felt unable to tolerate therapy today even with PT offering to check back.  Pt states her pain was out of control this weekend and she is waiting to talk to one of her MDs.  Pt requests PT to check back tomorrow.  Of note, pt does state she has been OOB to wash up and sit on window seat briefly this weekend.  Pt also had brief OPPT visit on 07/08/22 for her back pain and stated she couldn't tolerate therapy and was going to go to ER on that date.   Myrtis Hopping [place] 07/13/2022, 10:17 AM Jannette Spanner PT, DPT Physical Therapist Acute Rehabilitation Services Preferred contact method: Secure Chat Weekend Pager Only: [phone] Office: [phone]

## 2022-07-13 NOTE — Progress Notes (Signed)
Upon entering room @ 0600 round, RN found pt lying prone in the bed in tears. Pt stated adamantly that she wants to speak to someone "above a doctor" about her uncontrolled pain (pain reported as greater than 10/10 in her spine). RN proceeded to administer PRN Oxycodone '30mg'$  PO and placed heat packs along center of pt's back. RN consoled pt and made plan with pt to bring PRN '1mg'$  Dilaudid SQ at change of shift. Pt's request to speak to administrator conveyed to charge RN. NT came to offgoing/oncoming RNs during shift report and relayed pt's tearful demeanor and second request to speak to someone that could do something about her situation. Night shift RN proceeded to administer PRN '1mg'$  Dilaudid SQ and Valium '5mg'$  PO and intent to have someone come to speak with her was reiterated. Pt stated that, although both are a '30mg'$  dose, she feels the "blue Oxycodone pills" she takes at home control her pain better than the white pills; she also relayed that she doesn't understand why IV Dilaudid was discontinued. Day shift RN and charge RN updated with pt's concerns.

## 2022-07-13 NOTE — Care Management Important Message (Signed)
Important Message  Patient Details IM Letter given to the Patient. Name: ANDA SOBOTTA MRN: [id number] Date of Birth: 08-02-1981   Medicare Important Message Given:  Yes     Kerin Salen 07/13/2022, 10:54 AM

## 2022-07-13 NOTE — Progress Notes (Cosign Needed Addendum)
Cynthia Rosales is a 41 year old female with a medical history significant for hypertension, insulin-dependent diabetes mellitus type 2, hypertension, morbid obesity, and chronic back pain that was admitted for chronic pain exacerbation of her back.  On admission, patient claims a diagnosis of sickle cell disease that was diagnosed in [place] many years ago.  Patient reports chronic pain and has been taking oxycodone 30 mg every 4 hours and pain medications are managed by her PCP. Patient does not have a diagnosis of sickle cell disease.  Patient has been having worsening chronic back pain since her laminectomy and 2016.  Patient underwent an MRI of the lumbar and thoracic spine that showed no evidence for acute traumatic injury within the lumbar spine.  Reactive marrow edema and enhancement about the right greater than left L4-5 facets due to facet arthritis.  Finding could serve as a source for her back pain.  Shallow central disc protrusion at L5-S1 without significant stenosis or neural impingement.  MRI of thoracic spine shows no acute abnormalities within the thoracic spine or spinal cord.  There is a small disc protrusion at T7-8 and T8-9 with mild cord flattening, but no cord signal changes or significant spinal stenosis.  Today, [name] is very tearful. She is unable to rest comfortably on her back, she refused physical therapy duet to increased pain.  Patient is requesting to be transferred to a different service for her care.  Notified the flow manager who will contact hospitalists for report.  Also, orthopedic consult has been placed for further work-up and evaluation.  Objective:  Vital signs in last 24 hours:  Vitals:   07/12/22 0505 07/12/22 1330 07/12/22 2226 07/13/22 0554  BP: (!) 140/71 (!) 157/74 (!) 153/89 (!) 166/78  Pulse: 84 (!) 109 (!) 105 91  Resp: '14 14 16 16  '$ Temp: 98.1 F (36.7 C) 98.2 F (36.8 C) 98 F (36.7 C) 98.1 F (36.7 C)  TempSrc: Oral Oral Oral Oral  SpO2:  100% 96% 99% 96%  Weight:      Height:        Intake/Output from previous day:   Intake/Output Summary (Last 24 hours) at 07/13/2022 1300 Last data filed at 07/13/2022 1125 Gross per 24 hour  Intake 1780.35 ml  Output 2000 ml  Net -219.65 ml    Physical Exam: General: Alert, awake, oriented x3, acute distress  HEENT: [place]/AT PEERL, EOMI Neck: Trachea midline,  no masses, no thyromegal,y no JVD, no carotid bruit OROPHARYNX:  Moist, No exudate/ erythema/lesions.  Heart: Regular rate and rhythm, without murmurs, rubs, gallops, PMI non-displaced, no heaves or thrills on palpation.  Lungs: Clear to auscultation, no wheezing or rhonchi noted. No increased vocal fremitus resonant to percussion  Abdomen: Soft, nontender, nondistended, positive bowel sounds, no masses no hepatosplenomegaly noted..  Neuro:Upper and lower extremity weakness Musculoskeletal: Guarded.  Psychiatric: Tearful affect   Lab Results:  Basic Metabolic Panel:    Component Value Date/Time   NA 140 07/12/2022 1154   NA 140 11/19/2014 0933   K 4.0 07/12/2022 1154   CL 107 07/12/2022 1154   CO2 26 07/12/2022 1154   BUN 13 07/12/2022 1154   BUN 10 11/19/2014 0933   CREATININE 0.85 07/12/2022 1154   GLUCOSE 99 07/12/2022 1154   CALCIUM 8.5 (L) 07/12/2022 1154   CBC:    Component Value Date/Time   WBC 12.5 (H) 07/12/2022 1154   HGB 11.0 (L) 07/12/2022 1154   HCT 35.0 (L) 07/12/2022 1154   PLT 370 07/12/2022  1154   MCV 74.2 (L) 07/12/2022 1154   NEUTROABS 7.9 (H) 07/12/2022 1154   LYMPHSABS 3.8 07/12/2022 1154   MONOABS 0.5 07/12/2022 1154   EOSABS 0.2 07/12/2022 1154   BASOSABS 0.1 07/12/2022 1154    Recent Results (from the past 240 hour(s))  SARS Coronavirus 2 by RT PCR (hospital order, performed in [facility] hospital lab) *cepheid single result test* Anterior Nasal Swab     Status: None   Collection Time: 07/08/22 11:33 PM   Specimen: Anterior Nasal Swab  Result Value Ref Range Status   SARS  Coronavirus 2 by RT PCR NEGATIVE NEGATIVE Final    Comment: (NOTE) SARS-CoV-2 target nucleic acids are NOT DETECTED.  The SARS-CoV-2 RNA is generally detectable in upper and lower respiratory specimens during the acute phase of infection. The lowest concentration of SARS-CoV-2 viral copies this assay can detect is 250 copies / mL. A negative result does not preclude SARS-CoV-2 infection and should not be used as the sole basis for treatment or other patient management decisions.  A negative result may occur with improper specimen collection / handling, submission of specimen other than nasopharyngeal swab, presence of viral mutation(s) within the areas targeted by this assay, and inadequate number of viral copies (<250 copies / mL). A negative result must be combined with clinical observations, patient history, and epidemiological information.  Fact Sheet for Patients:   https://www.patel.info/  Fact Sheet for Healthcare Providers: https://hall.com/  This test is not yet approved or  cleared by the [place] FDA and has been authorized for detection and/or diagnosis of SARS-CoV-2 by FDA under an Emergency Use Authorization (EUA).  This EUA will remain in effect (meaning this test can be used) for the duration of the COVID-19 declaration under Section 564(b)(1) of the Act, 21 U.S.C. section 360bbb-3(b)(1), unless the authorization is terminated or revoked sooner.  Performed at [facility], [place] [address], [place], [place] [address]   Urine Culture     Status: Abnormal   Collection Time: 07/09/22  2:37 AM   Specimen: Urine, Clean Catch  Result Value Ref Range Status   Specimen Description   Final    URINE, CLEAN CATCH Performed at [facility], [place] [address], [place], [place] [address]    Special Requests   Final    NONE Performed at [facility], [place] [address],  [place],  [address]    Culture >=100,000 COLONIES/mL ESCHERICHIA COLI (A)  Final   Report Status 07/11/2022 FINAL  Final   Organism ID, Bacteria ESCHERICHIA COLI (A)  Final      Susceptibility   Escherichia coli - MIC*    AMPICILLIN <=2 SENSITIVE Sensitive     CEFAZOLIN <=4 SENSITIVE Sensitive     CEFEPIME <=0.12 SENSITIVE Sensitive     CEFTRIAXONE <=0.25 SENSITIVE Sensitive     CIPROFLOXACIN <=0.25 SENSITIVE Sensitive     GENTAMICIN <=1 SENSITIVE Sensitive     IMIPENEM <=0.25 SENSITIVE Sensitive     NITROFURANTOIN <=16 SENSITIVE Sensitive     TRIMETH/SULFA <=20 SENSITIVE Sensitive     AMPICILLIN/SULBACTAM <=2 SENSITIVE Sensitive     PIP/TAZO <=4 SENSITIVE Sensitive     * >=100,000 COLONIES/mL ESCHERICHIA COLI    Studies/Results: MR Lumbar Spine W Wo Contrast  Result Date: 07/12/2022 CLINICAL DATA:  Initial evaluation for back trauma, abnormal neuro exam. EXAM: MRI LUMBAR SPINE WITHOUT AND WITH CONTRAST TECHNIQUE: Multiplanar and multiecho pulse sequences of the lumbar spine were obtained without and with  intravenous contrast. CONTRAST:  47m GADAVIST GADOBUTROL 1 MMOL/ML IV SOLN COMPARISON:  Prior study from 08/11/2019. FINDINGS: Segmentation: Standard. Lowest well-formed disc space labeled the L5-S1 level. Alignment: Physiologic with preservation of the normal lumbar lordosis. No listhesis. Vertebrae: Vertebral body height well maintained without acute or chronic fracture. Bone marrow signal intensity diffusely decreased on T1 weighted sequence, nonspecific, but most commonly related to anemia, smoking or obesity. No discrete or worrisome osseous lesions. Mild reactive marrow edema and enhancement present about the right greater than left L4-5 facets due to facet arthritis. No other abnormal marrow edema. Conus medullaris and cauda equina: Conus extends to the L1 level. Conus and cauda equina appear normal. No abnormal enhancement. Paraspinal and other soft tissues: Unremarkable. Disc  levels: L1-2:  Unremarkable. L2-3:  Unremarkable. L3-4:  Unremarkable. L4-5: Normal interspace. Right greater than left facet hypertrophy. Associated reactive marrow edema with small bilateral joint effusions, right greater than left. No spinal stenosis. Foramina remain patent. L5-S1: Disc desiccation. Shallow broad-based central disc protrusion with slight cephalad angulation. Associated enhancing annular fissure. Mild facet hypertrophy. Epidural lipomatosis. No significant canal or lateral recess stenosis. Foramina remain patent. IMPRESSION: 1. No evidence for acute traumatic injury within the lumbar spine. 2. Reactive marrow edema and enhancement about the right greater than left L4-5 facets due to facet arthritis. Finding could serve as a source for back pain. 3. Shallow central disc protrusion at L5-S1 without significant stenosis or neural impingement. Electronically Signed   By: BJeannine BogaM.D.   On: 07/12/2022 05:38   MR THORACIC SPINE W WO CONTRAST  Result Date: 07/12/2022 CLINICAL DATA:  Initial evaluation for back trauma, abnormal neuro exam. EXAM: MRI THORACIC WITHOUT AND WITH CONTRAST TECHNIQUE: Multiplanar and multiecho pulse sequences of the thoracic spine were obtained without and with intravenous contrast. CONTRAST:  156mGADAVIST GADOBUTROL 1 MMOL/ML IV SOLN COMPARISON:  None available. FINDINGS: Alignment: Straightening of the normal thoracic kyphosis with underlying mild dextroscoliosis. No listhesis. Vertebrae: Vertebral body height maintained without acute or chronic fracture. Bone marrow signal intensity diffusely decreased on T1 weighted sequence, nonspecific, but most commonly related to anemia, smoking or obesity. Subcentimeter benign hemangioma in the T8 vertebral body. No other discrete or worrisome osseous lesions. No abnormal marrow edema or enhancement. Cord: Normal signal and morphology. Diffuse prominence of the dorsal epidural fat noted. Paraspinal and other soft  tissues: Unremarkable. Disc levels: T7-8: Mild disc bulge with disc desiccation. Superimposed small left paracentral disc protrusion mildly indents the left ventral thecal sac (series 19, image 23). Minimal cord flattening without cord signal changes. No significant spinal stenosis. Foramina remain patent. T8-9: Central disc protrusion indents the ventral thecal sac (series 17, image 9). Probable mild cord flattening without cord signal changes. No significant spinal stenosis. Foramina remain patent. Otherwise, no other significant disc pathology seen within the thoracic spine. Intervertebral discs are otherwise well hydrated with preserved disc height. No other focal disc protrusion. No significant facet pathology. No significant spinal stenosis. Foramina remain patent. IMPRESSION: 1. No acute abnormality identified within the thoracic spine or spinal cord. 2. Small disc protrusions at T7-8 and T8-9 with mild cord flattening, but no cord signal changes or significant spinal stenosis. Electronically Signed   By: BeJeannine Boga.D.   On: 07/12/2022 05:24    Medications: Scheduled Meds:  enoxaparin (LOVENOX) injection  75 mg Subcutaneous Q24H   furosemide  40 mg Oral Daily    HYDROmorphone (DILAUDID) injection  2 mg Intravenous Once   insulin aspart protamine-  aspart  36 Units Subcutaneous BID WC   methylPREDNISolone (SOLU-MEDROL) injection  125 mg Intravenous Daily   pregabalin  50 mg Oral BID   senna-docusate  1 tablet Oral BID   sodium chloride flush  10-40 mL Intracatheter Q12H   sodium chloride flush  3 mL Intravenous Q12H   Continuous Infusions:  0.45 % NaCl with KCl 20 mEq / L 100 mL/hr at 07/13/22 0701   promethazine (PHENERGAN) injection (IM or IVPB) Stopped (07/09/22 1940)   PRN Meds:.acetaminophen **OR** acetaminophen, diazepam, hydrALAZINE, HYDROmorphone (DILAUDID) injection, ondansetron **OR** [DISCONTINUED] ondansetron (ZOFRAN) IV, mouth rinse, oxyCODONE, polyethylene glycol,  promethazine (PHENERGAN) injection (IM or IVPB), sodium chloride flush  Consultants: Hospitalist Orthopedic   Procedures: none  Antibiotics: none  Assessment/Plan: Principal Problem:   AKI (acute kidney injury) ([place]) Active Problems:   Type 2 diabetes mellitus without complication, with long-term current use of insulin (HCC)   Essential hypertension   Lactic acidosis   Leukocytosis   Sickle cell pain crisis ([place])  Chronic pain syndrome: Today, patient is requesting transition to hospitalist service.  Hospitalist contacted. Patient has no sickle cell disease, but does have chronic pain related to significant back pain.  Patient underwent an MRI of the thoracic and lumbar spine as discussed above.  She has significant inflammation to L4-L5, but no significant findings to explain the degree of her pain. Patient has a history of acute kidney injury, we will refrain from NSAIDs.  However, will trial IV steroids x2 doses. Continue Lyrica 50 mg every 12 hours Dilaudid 1 mg IV every 6 hours as needed for severe breakthrough pain Also, we will continue patient's home medication of oxycodone 30 mg every 4 hours as needed.  Patient states that her pain is not well controlled on this regimen, no further changes will be made at this time. Will consult orthopedic specialist and defer for further work-up and evaluation.  Acute kidney injury: This problem is resolved following fluid resuscitation.  We will continue to avoid nephrotoxins.  Hypokalemia: Resolved.  Potassium repleted.  Continue to monitor closely.  Labs in AM.  Type 2 diabetes mellitus: Improving.  Continue insulin 70-30  36 units every 12 hours.  No sliding scale coverage.  Continue carbohydrate modified diet.  Essential hypertension: Stable.  Continue home medications.  Morbid obesity:  Continue carbohydrate modified diet.   Code Status: Full Code Family Communication: N/A Disposition Plan: Not yet ready for  discharge   Sampson, MSN, FNP-C Patient [facility] [address] [place], [place] [address] [phone]  If 7PM-7AM, please contact night-coverage.  07/13/2022, 1:00 PM  LOS: 4 days   Donia Pounds  APRN, MSN, FNP-C Patient [facility] [address] [place], [place] [address] [phone]

## 2022-07-13 NOTE — Progress Notes (Signed)

## 2022-07-14 ENCOUNTER — Encounter (HOSPITAL_COMMUNITY): Payer: Self-pay | Admitting: Internal Medicine

## 2022-07-14 DIAGNOSIS — M545 Low back pain, unspecified: Secondary | ICD-10-CM | POA: Diagnosis not present

## 2022-07-14 DIAGNOSIS — N390 Urinary tract infection, site not specified: Secondary | ICD-10-CM

## 2022-07-14 DIAGNOSIS — E876 Hypokalemia: Secondary | ICD-10-CM

## 2022-07-14 LAB — GLUCOSE, CAPILLARY
Glucose-Capillary: 248 mg/dL — ABNORMAL HIGH (ref 70–99)
Glucose-Capillary: 313 mg/dL — ABNORMAL HIGH (ref 70–99)
Glucose-Capillary: 315 mg/dL — ABNORMAL HIGH (ref 70–99)

## 2022-07-14 MED ORDER — METHYLPREDNISOLONE 4 MG PO TABS: ORAL_TABLET | ORAL | 0 refills | Status: DC

## 2022-07-14 MED ORDER — METHOCARBAMOL 500 MG PO TABS: 500.0000 mg | ORAL_TABLET | Freq: Three times a day (TID) | ORAL | 0 refills | Status: DC | PRN

## 2022-07-14 MED ORDER — IBUPROFEN 400 MG PO TABS: 400.0000 mg | ORAL_TABLET | Freq: Two times a day (BID) | ORAL | Status: DC

## 2022-07-14 MED ORDER — OXYCODONE HCL 30 MG PO TABS: 30.0000 mg | ORAL_TABLET | ORAL | 0 refills | Status: AC | PRN

## 2022-07-14 NOTE — TOC Transition Note (Signed)
Transition of Care [facility]) - CM/SW Discharge Note   Patient Details  Name: Cynthia Rosales MRN: [id number] Date of Birth: 1981-02-16  Transition of Care [facility]) CM/SW Contact:  Ross Ludwig, LCSW Phone Number: 07/14/2022, 3:49 PM   Clinical Narrative:     CSW was informed that patient needs EMS transport back home.  Bedside nurse confirmed address for patient, CSW contacted EMS to set up transport.  CSW signing off, please reconsult if other TOC works arise.   Final next level of care: Home/Self Care Barriers to Discharge: Barriers Resolved   Patient Goals and CMS Choice Patient states their goals for this hospitalization and ongoing recovery are:: To return back home.      Discharge Placement                       Discharge Plan and Services                                     Social Determinants of Health (SDOH) Interventions     Readmission Risk Interventions     No data to display

## 2022-07-14 NOTE — Hospital Course (Signed)
Mrs. Been is a 41 y.o. F with HTN, DM, HTN, morbid obesity, a prior diagnosis of sickle cell disease, and chronic back pain who presented with acute low back pain.     8/10: Admitted to Sickle Cell service 8/12: MRI lumbar and thoracic spine shows facet arthritis at L4-5 and small disc protrusions at T7-8 and T8-9 without spinal stenosis 8/14: Sickle cell disease ruled out, transferred to hospitalist service

## 2022-07-14 NOTE — Evaluation (Signed)
Physical Therapy Evaluation Patient Details Name: Cynthia Rosales MRN: [id number] DOB: [birth year] Today's Date: 07/14/2022  History of Present Illness  Pt is a 41yo female presenting to [facility] ED on 8/10 via EMS with complaints of diffuse pain for one week.   PMH: anemia, HTN, schizophrenia, sickle cell anemia, DM, posterior cervical laminectomy for intradural tumor.   Clinical Impression  Pt presents with the problems listed above and functional impairments below. Pt received supine in bed and agreeable to be seen despite 10/10 pain. Pt required min guard for bed mobility and transfers, pt uses BUE to assist BLE out of and into bed. Pt sat EOB for several minutes and without increase in pain report and demonstrated good balance. Pt stood at EOB for ~7mn but declined to ambulate, citing back pain. Pt unable to mobilize BLE for strength testing but was able to perform transfers without PT providing physical assistance. Discussed SNF-level therapies upon discharge and pt is agreeable as her goal is to improve her independence with functional mobility tasks and she has been nearly totally reliant on her family. We will continue to follow acutely.       Recommendations for follow up therapy are one component of a multi-disciplinary discharge planning process, led by the attending physician.  Recommendations may be updated based on patient status, additional functional criteria and insurance authorization.  Follow Up Recommendations Skilled nursing-short term rehab (<3 hours/day) Can patient physically be transported by private vehicle: No    Assistance Recommended at Discharge Frequent or constant Supervision/Assistance  Patient can return home with the following  Help with stairs or ramp for entrance;Assist for transportation;Assistance with cooking/housework;A lot of help with walking and/or transfers;A little help with bathing/dressing/bathroom    Equipment Recommendations Other (comment) (Shower  seat)  Recommendations for Other Services       Functional Status Assessment Patient has had a recent decline in their functional status and demonstrates the ability to make significant improvements in function in a reasonable and predictable amount of time.     Precautions / Restrictions Precautions Precautions: Fall Restrictions Weight Bearing Restrictions: No      Mobility  Bed Mobility Overal bed mobility: Needs Assistance Bed Mobility: Supine to Sit, Sit to Supine     Supine to sit: Min guard, HOB elevated Sit to supine: Min guard, HOB elevated   General bed mobility comments: Pt able to perform bed mobility with min guard, no physical assist provided. Pt uses bed rails and BUE to move BLE off and back on bed.    Transfers Overall transfer level: Needs assistance Equipment used: Rolling walker (2 wheels) Transfers: Sit to/from Stand Sit to Stand: Min assist, From elevated surface           General transfer comment: Pt required min assist for steadying of RW, no other physical assist required. Attempted to have pt complete step pivot transfer to chair and sit up, pt elected to defer citing pain.    Ambulation/Gait               General Gait Details: Pt deferred  Stairs            Wheelchair Mobility    Modified Rankin (Stroke Patients Only)       Balance Overall balance assessment: Needs assistance Sitting-balance support: Feet supported, No upper extremity supported Sitting balance-Leahy Scale: Good     Standing balance support: Reliant on assistive device for balance, During functional activity, Bilateral upper extremity supported Standing balance-Leahy Scale: Poor  Standing balance comment: Pt able to stand with BUE support on RW ~87mn without assistance.                             Pertinent Vitals/Pain Pain Assessment Pain Assessment: 0-10 Pain Score: 10-Worst pain ever Pain Location: back Pain Descriptors /  Indicators: Aching, Crying, Discomfort Pain Intervention(s): Limited activity within patient's tolerance, Monitored during session, Repositioned, Heat applied    Home Living Family/patient expects to be discharged to:: Private residence Living Arrangements: Spouse/significant other;Children (3 sons) Available Help at Discharge: Family;Available 24 hours/day Type of Home: House Home Access: Ramped entrance       Home Layout: One level Home Equipment: Rollator (4 wheels);Electric scooter      Prior Function Prior Level of Function : Independent/Modified Independent;History of Falls (last six months)             Mobility Comments: Pt uses rollator and scooter for mobility, for the past month limited to bed requiring assistance for transfers. ADLs Comments: family assists with dressing, bathing, providing meals, housework, high levels of assistance with all tasks     Hand Dominance   Dominant Hand: Right    Extremity/Trunk Assessment   Upper Extremity Assessment Upper Extremity Assessment: Generalized weakness;RUE deficits/detail;LUE deficits/detail RUE Deficits / Details: Pt has gross 4/5 strength, reporting pins and needles throughout UE RUE: Unable to fully assess due to pain RUE Sensation: decreased light touch LUE Deficits / Details: Pt has gross 4/5 strength, reporting pins and needles throughout UE LUE: Unable to fully assess due to pain LUE Sensation: decreased light touch    Lower Extremity Assessment Lower Extremity Assessment: RLE deficits/detail;LLE deficits/detail;Generalized weakness RLE Deficits / Details: Pt reporting inability to perform seated hip flexion, knee flexion/extension, or ank DF/PF but able to use these muscles functionally to perform transfers. reporting decreased sensation bilaterally. RLE: Unable to fully assess due to pain RLE Sensation: decreased light touch;decreased proprioception LLE Deficits / Details: Pt reporting inability to perform  seated hip flexion, knee flexion/extension, or ank DF/PF but able to use these muscles functionally to perform transfers. reporting decreased sensation bilaterally. LLE: Unable to fully assess due to pain LLE Sensation: decreased light touch;decreased proprioception    Cervical / Trunk Assessment Cervical / Trunk Assessment: Kyphotic;Neck Surgery  Communication   Communication: No difficulties  Cognition Arousal/Alertness: Awake/alert Behavior During Therapy: WFL for tasks assessed/performed Overall Cognitive Status: Within Functional Limits for tasks assessed                                          General Comments      Exercises     Assessment/Plan    PT Assessment Patient needs continued PT services  PT Problem List Decreased strength;Decreased range of motion;Decreased activity tolerance;Decreased balance;Decreased mobility;Decreased coordination;Pain       PT Treatment Interventions DME instruction;Gait training;Stair training;Functional mobility training;Therapeutic activities;Therapeutic exercise;Balance training;Neuromuscular re-education;Patient/family education    PT Goals (Current goals can be found in the Care Plan section)  Acute Rehab PT Goals Patient Stated Goal: To be able to care for herself PT Goal Formulation: With patient Time For Goal Achievement: 07/28/22 Potential to Achieve Goals: Fair    Frequency Min 2X/week     Co-evaluation               AM-PAC PT "6 Clicks" Mobility  Outcome  Measure Help needed turning from your back to your side while in a flat bed without using bedrails?: A Little Help needed moving from lying on your back to sitting on the side of a flat bed without using bedrails?: A Little Help needed moving to and from a bed to a chair (including a wheelchair)?: A Lot Help needed standing up from a chair using your arms (e.g., wheelchair or bedside chair)?: A Lot Help needed to walk in hospital room?: A  Lot Help needed climbing 3-5 steps with a railing? : Total 6 Click Score: 13    End of Session Equipment Utilized During Treatment: Gait belt Activity Tolerance: Patient limited by pain Patient left: in bed;with call bell/phone within reach Nurse Communication: Mobility status PT Visit Diagnosis: Pain;Difficulty in walking, not elsewhere classified (R26.2);Muscle weakness (generalized) (M62.81) Pain - part of body: Hip (Lower back)    Time: 7673-4193 PT Time Calculation (min) (ACUTE ONLY): 21 min   Charges:   PT Evaluation $PT Eval Low Complexity: 1 Low          Coolidge Breeze, PT, DPT [facility] Office: [phone] Pager: [phone]  Coolidge Breeze 07/14/2022, 3:09 PM

## 2022-07-14 NOTE — Plan of Care (Signed)
  Problem: Education: Goal: Ability to describe self-care measures that may prevent or decrease complications (Diabetes Survival Skills Education) will improve Outcome: Progressing Goal: Individualized Educational Video(s) Outcome: Progressing   Problem: Coping: Goal: Ability to adjust to condition or change in health will improve Outcome: Progressing   Problem: Fluid Volume: Goal: Ability to maintain a balanced intake and output will improve Outcome: Progressing   Problem: Health Behavior/Discharge Planning: Goal: Ability to identify and utilize available resources and services will improve Outcome: Progressing Goal: Ability to manage health-related needs will improve Outcome: Progressing   Problem: Metabolic: Goal: Ability to maintain appropriate glucose levels will improve Outcome: Progressing   Problem: Nutritional: Goal: Maintenance of adequate nutrition will improve Outcome: Progressing Goal: Progress toward achieving an optimal weight will improve Outcome: Progressing   Problem: Skin Integrity: Goal: Risk for impaired skin integrity will decrease Outcome: Progressing   Problem: Tissue Perfusion: Goal: Adequacy of tissue perfusion will improve Outcome: Progressing   Problem: Education: Goal: Knowledge of General Education information will improve Description: Including pain rating scale, medication(s)/side effects and non-pharmacologic comfort measures Outcome: Progressing   Problem: Health Behavior/Discharge Planning: Goal: Ability to manage health-related needs will improve Outcome: Progressing   Problem: Clinical Measurements: Goal: Ability to maintain clinical measurements within normal limits will improve Outcome: Progressing Goal: Will remain free from infection Outcome: Progressing Goal: Diagnostic test results will improve Outcome: Progressing Goal: Respiratory complications will improve Outcome: Progressing Goal: Cardiovascular complication will  be avoided Outcome: Progressing   Problem: Activity: Goal: Risk for activity intolerance will decrease Outcome: Progressing   Problem: Nutrition: Goal: Adequate nutrition will be maintained Outcome: Progressing   Problem: Coping: Goal: Level of anxiety will decrease Outcome: Progressing   Problem: Elimination: Goal: Will not experience complications related to bowel motility Outcome: Progressing Goal: Will not experience complications related to urinary retention Outcome: Progressing   Problem: Pain Managment: Goal: General experience of comfort will improve Outcome: Progressing   Problem: Safety: Goal: Ability to remain free from injury will improve Outcome: Progressing   Problem: Skin Integrity: Goal: Risk for impaired skin integrity will decrease Outcome: Progressing   Problem: Education: Goal: Knowledge of vaso-occlusive preventative measures will improve Outcome: Progressing Goal: Awareness of infection prevention will improve Outcome: Progressing Goal: Awareness of signs and symptoms of anemia will improve Outcome: Progressing Goal: Long-term complications will improve Outcome: Progressing   Problem: Self-Care: Goal: Ability to incorporate actions that prevent/reduce pain crisis will improve Outcome: Progressing   Problem: Bowel/Gastric: Goal: Gut motility will be maintained Outcome: Progressing   Problem: Tissue Perfusion: Goal: Complications related to inadequate tissue perfusion will be avoided or minimized Outcome: Progressing   Problem: Respiratory: Goal: Pulmonary complications will be avoided or minimized Outcome: Progressing Goal: Acute Chest Syndrome will be identified early to prevent complications Outcome: Progressing   Problem: Fluid Volume: Goal: Ability to maintain a balanced intake and output will improve Outcome: Progressing   Problem: Sensory: Goal: Pain level will decrease with appropriate interventions Outcome: Progressing    Problem: Health Behavior: Goal: Postive changes in compliance with treatment and prescription regimens will improve Outcome: Progressing

## 2022-07-14 NOTE — Inpatient Diabetes Management (Signed)
Inpatient Diabetes Program Recommendations  AACE/ADA: New Consensus Statement on Inpatient Glycemic Control (2015)  Target Ranges:  Prepandial:   less than 140 mg/dL      Peak postprandial:   less than 180 mg/dL (1-2 hours)      Critically ill patients:  140 - 180 mg/dL   Lab Results  Component Value Date   GLUCAP 313 (H) 07/14/2022   HGBA1C 8.0 (H) 07/09/2022    Review of Glycemic Control  Latest Reference Range & Units 07/13/22 07:54 07/13/22 12:31 07/13/22 16:27 07/13/22 22:02 07/14/22 07:44 07/14/22 12:47  Glucose-Capillary 70 - 99 mg/dL 125 (H) 137 (H) 134 (H) 168 (H) 248 (H) 313 (H)  (H): Data is abnormally high  Diabetes history: DM2 Outpatient Diabetes medications: Humlog 75/25 36 units BID Current orders for Inpatient glycemic control: Novolog 70/30 36 units BID   HgbA1C - 8%   Inpatient Diabetes Program Recommendations:     Add Novolog 0-9 units TID with meals and 0-5 HS  Thank you, Bethena Roys E. Jahdai Padovano, RN, MSN, CDE  Diabetes Coordinator Inpatient Glycemic Control Team Team Pager [phone] (8am-5pm) 07/14/2022 1:09 PM

## 2022-07-14 NOTE — Progress Notes (Signed)
Patient discharged with PTAR to home. IV removed, AVS given and all questions answered. All belongings gathered and removed by patient.

## 2022-07-14 NOTE — Discharge Summary (Signed)
Physician Discharge Summary   Patient: Cynthia Rosales MRN: [id number] DOB: Oct 01, 1981  Admit date:     07/08/2022  Discharge date: 07/14/22  Discharge Physician: Edwin Dada   PCP: Elwyn Reach, MD     Recommendations at discharge:  Spoke with Fenton Malling in the hospital, please follow up with [facility] Neurosurgery for vertebral facet arthritis Follow up with PCP Dr. Jonelle Sidle for correction of sickle cell diagnosis     Discharge Diagnoses: Principal Problem:   Acute low back pain Active Problems:   AKI (acute kidney injury) ([place])   Type 2 diabetes mellitus without complication, with long-term current use of insulin ([facility])   Essential hypertension   Obesity, Class III, BMI 40-49.9 (morbid obesity) ([place])   Hypokalemia   UTI (urinary tract infection)   Sickle cell disease ruled out    Hospital Course: Cynthia Rosales is a 41 y.o. F with HTN, DM, HTN, morbid obesity, a prior misdiagnosis of sickle cell disease, and chronic back pain who presented with acute low back pain.       Low back pain due to facet arthropathy Patient initially admitted for diffuse pain, largely focused on back.    First admitted to Sickle Cell service with claims that she has sickle cell disease.   After 3 days, MRI lumbar and thoracic spine shows facet arthritis at L4-5 and small disc protrusions at T7-8 and T8-9 without spinal stenosis and no other concerning findings.  Imaging reviewed with Neurosurgery, Fenton Malling, NP who recommended PT, anti-inflammatories, possible Medrol, muscle relaxers and outpatient follow up.    PT worked with the patient and recommended SNF, which the patient refused, and in addition refused HH, so was discharged home.      AKI (acute kidney injury) ([place]) Cr 1.4 on admission, due to NSAID use prior to admission.  NSAIDs held, given fluids, Cr resolved.     Sickle cell disease ruled out Patient claimed she had sickle cell disease and was  experiencing a "typical" pain flare.  Was admitted to the sickle cell service where it was noted that her HgbA1c was              The [place] was reviewed for this patient prior to discharge. She was counseled on the risks of concurrent use of opiates and benzodiazepines, she was counseled on the potential risk of oxycodone 30 mg.  Her daily use of oxycodone 30 mg 5-6 times per day was verified in the [facility] and she was given a 3 days supply to bridge to her appointment on Friday. She is aware that violations of her contract may result in termination of her prescription.  Diet recommendation:  Discharge Diet Orders (From admission, onward)     Start     Ordered   07/14/22 0000  Diet - low sodium heart healthy        07/14/22 1417             DISCHARGE MEDICATION: Allergies as of 07/14/2022       Reactions   Amoxicillin Hives, Rash, Other (See Comments)   PATIENT HAS HAD A PCN REACTION WITH IMMEDIATE RASH, FACIAL/TONGUE/THROAT SWELLING, SOB, OR LIGHTHEADEDNESS WITH HYPOTENSION:  #  #  YES  #  #  Has patient had a PCN reaction causing severe rash involving mucus membranes or skin necrosis: No Has patient had a PCN reaction that required hospitalization: No  Has patient had a PCN reaction occurring within the last  10 years: #  #  #  YES  #  #  #  If all of the above answers are "NO", then may proceed with Cephalosporin use.   Buprenorphine Hcl Itching, Nausea Only   Morphine And Related Itching, Nausea Only        Medication List     STOP taking these medications    benzonatate 200 MG capsule Commonly known as: TESSALON   diazepam 5 MG tablet Commonly known as: VALIUM   diphenoxylate-atropine 2.5-0.025 MG tablet Commonly known as: Lomotil   lidocaine 2 % solution Commonly known as: XYLOCAINE   metoCLOPramide 10 MG tablet Commonly known as: REGLAN   ondansetron 4 MG disintegrating tablet Commonly known as: ZOFRAN-ODT        TAKE these medications    estradiol 0.075 mg/24hr patch Commonly known as: CLIMARA - Dosed in mg/24 hr Place 0.075 mg onto the skin once a week.   furosemide 40 MG tablet Commonly known as: LASIX Take 40 mg by mouth daily.   HumaLOG Mix 75/25 KwikPen (75-25) 100 UNIT/ML Kwikpen Generic drug: Insulin Lispro Prot & Lispro Inject 36 Units into the skin in the morning, at noon, and at bedtime.   hydrochlorothiazide 25 MG tablet Commonly known as: HYDRODIURIL Take 25 mg by mouth daily.   ibuprofen 400 MG tablet Commonly known as: ADVIL Take 1 tablet (400 mg total) by mouth in the morning and at bedtime. What changed:  medication strength how much to take when to take this   methocarbamol 500 MG tablet Commonly known as: ROBAXIN Take 1 tablet (500 mg total) by mouth every 8 (eight) hours as needed for muscle spasms.   methylPREDNISolone 4 MG tablet Commonly known as: Medrol Take 16 mg (4 tabs) for 1 day then take 12 mg (3 tabs) for 1 day then take 8 mg (2 tabs) for 1 day then take 4 mg (1 tab) for one day then stop   ondansetron 4 MG tablet Commonly known as: ZOFRAN Take 1 tablet (4 mg total) by mouth every 6 (six) hours as needed for nausea or vomiting.   oxycodone 30 MG immediate release tablet Commonly known as: ROXICODONE Take 1 tablet (30 mg total) by mouth every 4 (four) hours as needed for up to 3 days for pain. What changed: when to take this   Ozempic (0.25 or 0.5 MG/DOSE) 2 MG/3ML Sopn Generic drug: Semaglutide(0.25 or 0.'5MG'$ /DOS) Inject 0.25 mg into the skin once a week.   potassium chloride 10 MEQ CR capsule Commonly known as: MICRO-K Take 10 mEq by mouth daily.   sitaGLIPtin 100 MG tablet Commonly known as: JANUVIA Take 100 mg by mouth daily.        Follow-up Information     Elwyn Reach, MD Follow up.   Specialty: Internal Medicine Why: Patient warrants referral to hematology for further work-up and evaluation. Contact  information: [place] [address] [phone]         Earnie Larsson, MD Follow up.   Specialty: Neurosurgery Contact information: [address]  [place] [address] [phone]                 Discharge Instructions     Diet - low sodium heart healthy   Complete by: As directed    Discharge instructions   Complete by: As directed    From Dr. Loleta Books: You were admitted for back pain You had an MRI spine that showed no  acute findings, maybe some facet arthritis I discussed this with Dr. Marchelle Folks partner on call, Weston Brass and he recommended that you follow up in the office  Continue physical therapy  Take the last 4 days of steroids with methylprednisolone/Medrol: Weds - Take methylprednisolone 16 mg (4 tabs) Thu - Take methylprednisolone 12 mg (3 tabs) Fri - Take methylprednisolone 8 mg (2 tabs) Sat - Take methylprednisolone 4 mg (1 tab)  In addition, you may take low doses of iburpofen (like 400 mg twice daily) You should take Robaxin/methocarbamol 500 mg up to three times daily for muscle relaxer  Take acetaminophen, no more than 1000 mg three times daily    Take your home insulin 36 units three times daily If your sugars are still in the 200s or 300s, increase this to 40 units three times daily and call your primary doctor  Go see your primary doctor asap  Do not take Valium with oxycodone 30 mg  Continue with your physical therapist   Increase activity slowly   Complete by: As directed        Discharge Exam: Filed Weights   07/09/22 1625  Weight: (!) 157.1 kg    General: Pt is alert, awake, not in acute distress, obese, talking on her phone Cardiovascular: RRR, nl S1-S2, no murmurs appreciated.   No LE edema.   Respiratory: Normal respiratory rate and rhythm.  CTAB without rales or wheezes. Abdominal: Abdomen soft and non-tender.  No distension or HSM.   Neuro/Psych: Strength symmetric in upper  extremities, lower extremity strength limited by pain and effort. Judgment and insight appear normal .   Condition at discharge: fair  The results of significant diagnostics from this hospitalization (including imaging, microbiology, ancillary and laboratory) are listed below for reference.   Imaging Studies: MR Lumbar Spine W Wo Contrast  Result Date: 07/12/2022 CLINICAL DATA:  Initial evaluation for back trauma, abnormal neuro exam. EXAM: MRI LUMBAR SPINE WITHOUT AND WITH CONTRAST TECHNIQUE: Multiplanar and multiecho pulse sequences of the lumbar spine were obtained without and with intravenous contrast. CONTRAST:  53m GADAVIST GADOBUTROL 1 MMOL/ML IV SOLN COMPARISON:  Prior study from 08/11/2019. FINDINGS: Segmentation: Standard. Lowest well-formed disc space labeled the L5-S1 level. Alignment: Physiologic with preservation of the normal lumbar lordosis. No listhesis. Vertebrae: Vertebral body height well maintained without acute or chronic fracture. Bone marrow signal intensity diffusely decreased on T1 weighted sequence, nonspecific, but most commonly related to anemia, smoking or obesity. No discrete or worrisome osseous lesions. Mild reactive marrow edema and enhancement present about the right greater than left L4-5 facets due to facet arthritis. No other abnormal marrow edema. Conus medullaris and cauda equina: Conus extends to the L1 level. Conus and cauda equina appear normal. No abnormal enhancement. Paraspinal and other soft tissues: Unremarkable. Disc levels: L1-2:  Unremarkable. L2-3:  Unremarkable. L3-4:  Unremarkable. L4-5: Normal interspace. Right greater than left facet hypertrophy. Associated reactive marrow edema with small bilateral joint effusions, right greater than left. No spinal stenosis. Foramina remain patent. L5-S1: Disc desiccation. Shallow broad-based central disc protrusion with slight cephalad angulation. Associated enhancing annular fissure. Mild facet hypertrophy.  Epidural lipomatosis. No significant canal or lateral recess stenosis. Foramina remain patent. IMPRESSION: 1. No evidence for acute traumatic injury within the lumbar spine. 2. Reactive marrow edema and enhancement about the right greater than left L4-5 facets due to facet arthritis. Finding could serve as a source for back pain. 3. Shallow central disc protrusion at L5-S1 without significant stenosis or neural impingement.  Electronically Signed   By: Jeannine Boga M.D.   On: 07/12/2022 05:38   MR THORACIC SPINE W WO CONTRAST  Result Date: 07/12/2022 CLINICAL DATA:  Initial evaluation for back trauma, abnormal neuro exam. EXAM: MRI THORACIC WITHOUT AND WITH CONTRAST TECHNIQUE: Multiplanar and multiecho pulse sequences of the thoracic spine were obtained without and with intravenous contrast. CONTRAST:  41m GADAVIST GADOBUTROL 1 MMOL/ML IV SOLN COMPARISON:  None available. FINDINGS: Alignment: Straightening of the normal thoracic kyphosis with underlying mild dextroscoliosis. No listhesis. Vertebrae: Vertebral body height maintained without acute or chronic fracture. Bone marrow signal intensity diffusely decreased on T1 weighted sequence, nonspecific, but most commonly related to anemia, smoking or obesity. Subcentimeter benign hemangioma in the T8 vertebral body. No other discrete or worrisome osseous lesions. No abnormal marrow edema or enhancement. Cord: Normal signal and morphology. Diffuse prominence of the dorsal epidural fat noted. Paraspinal and other soft tissues: Unremarkable. Disc levels: T7-8: Mild disc bulge with disc desiccation. Superimposed small left paracentral disc protrusion mildly indents the left ventral thecal sac (series 19, image 23). Minimal cord flattening without cord signal changes. No significant spinal stenosis. Foramina remain patent. T8-9: Central disc protrusion indents the ventral thecal sac (series 17, image 9). Probable mild cord flattening without cord signal  changes. No significant spinal stenosis. Foramina remain patent. Otherwise, no other significant disc pathology seen within the thoracic spine. Intervertebral discs are otherwise well hydrated with preserved disc height. No other focal disc protrusion. No significant facet pathology. No significant spinal stenosis. Foramina remain patent. IMPRESSION: 1. No acute abnormality identified within the thoracic spine or spinal cord. 2. Small disc protrusions at T7-8 and T8-9 with mild cord flattening, but no cord signal changes or significant spinal stenosis. Electronically Signed   By: BJeannine BogaM.D.   On: 07/12/2022 05:24   DG Chest 2 View  Result Date: 07/08/2022 CLINICAL DATA:  Chest pain EXAM: CHEST - 2 VIEW COMPARISON:  01/09/2022 FINDINGS: The heart size and mediastinal contours are within normal limits. Both lungs are clear. The visualized skeletal structures are unremarkable. IMPRESSION: No active cardiopulmonary disease. Electronically Signed   By: KRolm BaptiseM.D.   On: 07/08/2022 19:15    Microbiology: Results for orders placed or performed during the hospital encounter of 07/08/22  SARS Coronavirus 2 by RT PCR (hospital order[facility] *cepheid single result test* Anterior Nasal Swab     Status: None   Collection Time: 07/08/22 11:33 PM   Specimen: Anterior Nasal Swab  Result Value Ref Range Status   SARS Coronavirus 2 by RT PCR NEGATIVE NEGATIVE Final    Comment: (NOTE) SARS-CoV-2 target nucleic acids are NOT DETECTED.  The SARS-CoV-2 RNA is generally detectable in upper and lower respiratory specimens during the acute phase of infection. The lowest concentration of SARS-CoV-2 viral copies this assay can detect is 250 copies / mL. A negative result does not preclude SARS-CoV-2 infection and should not be used as the sole basis for treatment or other patient management decisions.  A negative result may occur with improper specimen collection /  handling, submission of specimen other than nasopharyngeal swab, presence of viral mutation(s) within the areas targeted by this assay, and inadequate number of viral copies (<250 copies / mL). A negative result must be combined with clinical observations, patient history, and epidemiological information.  Fact Sheet for Patients:   hhttps://www.patel.info/ Fact Sheet for Healthcare Providers: hhttps://hall.com/ This test is not yet approved or  cleared  by the [place] and has been authorized for detection and/or diagnosis of SARS-CoV-2 by FDA under an Emergency Use Authorization (EUA).  This EUA will remain in effect (meaning this test can be used) for the duration of the COVID-19 declaration under Section 564(b)(1) of the Act, 21 U.S.C. section 360bbb-3(b)(1), unless the authorization is terminated or revoked sooner.  Performed at [facility], [place] [address], [place], [place] [address]   Urine Culture     Status: Abnormal   Collection Time: 07/09/22  2:37 AM   Specimen: Urine, Clean Catch  Result Value Ref Range Status   Specimen Description   Final    URINE, CLEAN CATCH Performed at [facility], [place] [address], [place], [place] [address]    Special Requests   Final    NONE Performed at [facility], [place] [address] [place], [place] [address]    Culture >=100,000 COLONIES/mL ESCHERICHIA COLI (A)  Final   Report Status 07/11/2022 FINAL  Final   Organism ID, Bacteria ESCHERICHIA COLI (A)  Final      Susceptibility   Escherichia coli - MIC*    AMPICILLIN <=2 SENSITIVE Sensitive     CEFAZOLIN <=4 SENSITIVE Sensitive     CEFEPIME <=0.12 SENSITIVE Sensitive     CEFTRIAXONE <=0.25 SENSITIVE Sensitive     CIPROFLOXACIN <=0.25 SENSITIVE Sensitive     GENTAMICIN <=1 SENSITIVE Sensitive     IMIPENEM <=0.25 SENSITIVE Sensitive     NITROFURANTOIN <=16 SENSITIVE Sensitive      TRIMETH/SULFA <=20 SENSITIVE Sensitive     AMPICILLIN/SULBACTAM <=2 SENSITIVE Sensitive     PIP/TAZO <=4 SENSITIVE Sensitive     * >=100,000 COLONIES/mL ESCHERICHIA COLI    Labs: CBC: Recent Labs  Lab 07/08/22 2122 07/09/22 0700 07/10/22 0859 07/12/22 1154  WBC 16.1* 10.8* 10.7* 12.5*  NEUTROABS 10.5* 7.4 5.4 7.9*  HGB 13.0 11.0* 10.8* 11.0*  HCT 41.1 35.1* 34.2* 35.0*  MCV 72.2* 72.5* 73.1* 74.2*  PLT 402* 347 322 825   Basic Metabolic Panel: Recent Labs  Lab 07/08/22 2122 07/09/22 0700 07/10/22 0859 07/11/22 0550 07/12/22 1154  NA 141 137 134* 139 140  K 3.6 2.5* 2.7* 3.4* 4.0  CL 99 97* 99 104 107  CO2 '28 28 27 27 26  '$ GLUCOSE 104* 171* 197* 151* 99  BUN 27* 27* '15 17 13  '$ CREATININE 1.42* 1.17* 0.75 0.87 0.85  CALCIUM 9.4 8.2* 8.0* 8.3* 8.5*  MG  --  1.7  --   --   --    Liver Function Tests: Recent Labs  Lab 07/08/22 2122 07/09/22 0700 07/12/22 1154  AST 17 14* 15  ALT '16 14 13  '$ ALKPHOS 86 72 72  BILITOT 0.5 0.5 0.2*  PROT 9.1* 7.7 7.1  ALBUMIN 3.8 3.3* 3.0*   CBG: Recent Labs  Lab 07/13/22 1627 07/13/22 2202 07/14/22 0744 07/14/22 1247 07/14/22 1656  GLUCAP 134* 168* 248* 313* 315*    Discharge time spent: approximately 35 minutes spent on discharge counseling, evaluation of patient on day of discharge, and coordination of discharge planning with nursing, social work, pharmacy and case management  Signed: Edwin Dada, MD [facility] 07/14/2022

## 2022-07-15 ENCOUNTER — Ambulatory Visit: Payer: Medicare Other | Admitting: Physical Therapy

## 2022-07-20 ENCOUNTER — Ambulatory Visit: Payer: Medicare Other | Admitting: Physical Therapy

## 2022-07-22 ENCOUNTER — Ambulatory Visit: Payer: Medicare Other | Admitting: Physical Therapy

## 2022-07-27 ENCOUNTER — Ambulatory Visit: Payer: Medicare Other | Admitting: Physical Therapy

## 2022-07-28 ENCOUNTER — Ambulatory Visit: Payer: Medicare Other | Admitting: Physical Therapy

## 2022-07-29 ENCOUNTER — Ambulatory Visit: Payer: Medicare Other | Admitting: Physical Therapy

## 2022-08-04 ENCOUNTER — Ambulatory Visit: Payer: Medicare Other | Admitting: Physical Therapy

## 2022-08-05 ENCOUNTER — Ambulatory Visit: Payer: Medicare Other | Admitting: Physical Therapy

## 2022-08-07 ENCOUNTER — Ambulatory Visit: Payer: Medicare Other | Admitting: Physical Therapy

## 2022-08-10 ENCOUNTER — Ambulatory Visit: Payer: Medicare Other | Admitting: Physical Therapy

## 2022-08-11 ENCOUNTER — Ambulatory Visit: Payer: Medicare Other | Admitting: Physical Therapy

## 2022-08-12 ENCOUNTER — Ambulatory Visit: Payer: Medicare Other | Admitting: Physical Therapy

## 2023-05-19 ENCOUNTER — Emergency Department (HOSPITAL_COMMUNITY): Payer: 59

## 2023-05-19 ENCOUNTER — Other Ambulatory Visit: Payer: Self-pay

## 2023-05-19 ENCOUNTER — Emergency Department (HOSPITAL_COMMUNITY)
Admission: EM | Admit: 2023-05-19 | Discharge: 2023-05-19 | Disposition: A | Discharge status: Home / Self Care | Payer: 59 | Attending: Emergency Medicine | Admitting: Emergency Medicine

## 2023-05-19 ENCOUNTER — Encounter (HOSPITAL_COMMUNITY): Payer: Self-pay

## 2023-05-19 DIAGNOSIS — Z794 Long term (current) use of insulin: Secondary | ICD-10-CM | POA: Diagnosis not present

## 2023-05-19 DIAGNOSIS — J45909 Unspecified asthma, uncomplicated: Secondary | ICD-10-CM | POA: Diagnosis not present

## 2023-05-19 DIAGNOSIS — G8929 Other chronic pain: Secondary | ICD-10-CM | POA: Diagnosis not present

## 2023-05-19 DIAGNOSIS — Z79899 Other long term (current) drug therapy: Secondary | ICD-10-CM | POA: Diagnosis not present

## 2023-05-19 DIAGNOSIS — R590 Localized enlarged lymph nodes: Secondary | ICD-10-CM | POA: Diagnosis not present

## 2023-05-19 DIAGNOSIS — M545 Low back pain, unspecified: Secondary | ICD-10-CM | POA: Insufficient documentation

## 2023-05-19 DIAGNOSIS — Z87891 Personal history of nicotine dependence: Secondary | ICD-10-CM | POA: Diagnosis not present

## 2023-05-19 DIAGNOSIS — I10 Essential (primary) hypertension: Secondary | ICD-10-CM | POA: Insufficient documentation

## 2023-05-19 DIAGNOSIS — E119 Type 2 diabetes mellitus without complications: Secondary | ICD-10-CM | POA: Diagnosis not present

## 2023-05-19 DIAGNOSIS — R197 Diarrhea, unspecified: Secondary | ICD-10-CM | POA: Diagnosis not present

## 2023-05-19 DIAGNOSIS — R112 Nausea with vomiting, unspecified: Secondary | ICD-10-CM | POA: Diagnosis not present

## 2023-05-19 LAB — CBC WITH DIFFERENTIAL/PLATELET
Abs Immature Granulocytes: 0.02 10*3/uL (ref 0.00–0.07)
Basophils Absolute: 0.1 10*3/uL (ref 0.0–0.1)
Basophils Relative: 1 %
Eosinophils Absolute: 0.2 10*3/uL (ref 0.0–0.5)
Eosinophils Relative: 2 %
HCT: 36.2 % (ref 36.0–46.0)
Hemoglobin: 11.6 g/dL — ABNORMAL LOW (ref 12.0–15.0)
Immature Granulocytes: 0 %
Lymphocytes Relative: 43 %
Lymphs Abs: 3.9 10*3/uL (ref 0.7–4.0)
MCH: 23.5 pg — ABNORMAL LOW (ref 26.0–34.0)
MCHC: 32 g/dL (ref 30.0–36.0)
MCV: 73.4 fL — ABNORMAL LOW (ref 80.0–100.0)
Monocytes Absolute: 0.5 10*3/uL (ref 0.1–1.0)
Monocytes Relative: 5 %
Neutro Abs: 4.5 10*3/uL (ref 1.7–7.7)
Neutrophils Relative %: 49 %
Platelets: 311 10*3/uL (ref 150–400)
RBC: 4.93 MIL/uL (ref 3.87–5.11)
RDW: 17.4 % — ABNORMAL HIGH (ref 11.5–15.5)
WBC: 9 10*3/uL (ref 4.0–10.5)
nRBC: 0 % (ref 0.0–0.2)

## 2023-05-19 LAB — URINALYSIS, ROUTINE W REFLEX MICROSCOPIC
Bilirubin Urine: NEGATIVE
Glucose, UA: NEGATIVE mg/dL
Hgb urine dipstick: NEGATIVE
Ketones, ur: NEGATIVE mg/dL
Leukocytes,Ua: NEGATIVE
Nitrite: NEGATIVE
Protein, ur: 100 mg/dL — AB
Specific Gravity, Urine: 1.024 (ref 1.005–1.030)
pH: 6 (ref 5.0–8.0)

## 2023-05-19 LAB — LIPASE, BLOOD: Lipase: 31 U/L (ref 11–51)

## 2023-05-19 LAB — COMPREHENSIVE METABOLIC PANEL
ALT: 13 U/L (ref 0–44)
AST: 19 U/L (ref 15–41)
Albumin: 3.2 g/dL — ABNORMAL LOW (ref 3.5–5.0)
Alkaline Phosphatase: 80 U/L (ref 38–126)
Anion gap: 8 (ref 5–15)
BUN: 13 mg/dL (ref 6–20)
CO2: 25 mmol/L (ref 22–32)
Calcium: 8.6 mg/dL — ABNORMAL LOW (ref 8.9–10.3)
Chloride: 102 mmol/L (ref 98–111)
Creatinine, Ser: 0.83 mg/dL (ref 0.44–1.00)
GFR, Estimated: >60 mL/min (ref >60)
Glucose, Bld: 159 mg/dL — ABNORMAL HIGH (ref 70–99)
Potassium: 3.8 mmol/L (ref 3.5–5.1)
Sodium: 135 mmol/L (ref 135–145)
Total Bilirubin: 0.8 mg/dL (ref 0.3–1.2)
Total Protein: 7.7 g/dL (ref 6.5–8.1)

## 2023-05-19 MED ORDER — OXYCODONE HCL 5 MG PO TABS
30.0000 mg | ORAL_TABLET | Freq: Once | ORAL | Status: AC
  Administered 2023-05-19: 30 mg via ORAL
  Filled 2023-05-19: qty 6

## 2023-05-19 MED ORDER — IOHEXOL 300 MG/ML  SOLN
100.0000 mL | Freq: Once | INTRAMUSCULAR | Status: AC | PRN
  Administered 2023-05-19: 100 mL via INTRAVENOUS

## 2023-05-19 MED ORDER — HYDROMORPHONE HCL 1 MG/ML IJ SOLN
1.0000 mg | Freq: Once | INTRAMUSCULAR | Status: AC
  Administered 2023-05-19: 1 mg via INTRAVENOUS
  Filled 2023-05-19: qty 1

## 2023-05-19 MED ORDER — METOCLOPRAMIDE HCL 5 MG/ML IJ SOLN
10.0000 mg | Freq: Once | INTRAMUSCULAR | Status: AC
  Administered 2023-05-19: 10 mg via INTRAVENOUS
  Filled 2023-05-19: qty 2

## 2023-05-19 MED ORDER — SODIUM CHLORIDE (PF) 0.9 % IJ SOLN
INTRAMUSCULAR | Status: AC
  Filled 2023-05-19: qty 50

## 2023-05-19 MED ORDER — SODIUM CHLORIDE 0.9 % IV BOLUS
1000.0000 mL | Freq: Once | INTRAVENOUS | Status: AC
  Administered 2023-05-19: 1000 mL via INTRAVENOUS

## 2023-05-19 MED ORDER — ONDANSETRON HCL 4 MG/2ML IJ SOLN
4.0000 mg | Freq: Once | INTRAMUSCULAR | Status: AC
  Administered 2023-05-19: 4 mg via INTRAVENOUS
  Filled 2023-05-19: qty 2

## 2023-05-19 NOTE — Discharge Instructions (Signed)
You have enlarged lymph nodes in your chest concerning for possible sarcoid.  Called the pulmonologist today to schedule a follow-up visit

## 2023-05-19 NOTE — ED Provider Notes (Signed)
Patient signed to me by Dr. Read Drivers pending results of abdominal CT.  Abdominal CT shows no etiology to explain the patient's pain.  Some concern for possible pulmonary involvement.  Radiologist recommended CT of the chest which was performed and per interpretation showed enlarged mediastinal and bilateral hilar lymph nodes.  For sarcoid.  Will give referral to pulmonology.   Lorre Nick, MD 05/19/23 1002

## 2023-05-19 NOTE — ED Triage Notes (Addendum)
Patient brought in by EMS, reports back pain from her neck all the way down and left flank pain x4 days. Endorses n/v/d and difficulty with urination. 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

## 2023-05-19 NOTE — ED Provider Notes (Signed)
[facility]-EMERGENCY DEPT Provider Note: Lowella Dell, MD, FACEP  CSN: [id number] MRN: [id number] ARRIVAL: 05/19/23 at 0110 ROOM: WA11/WA11   CHIEF COMPLAINT  Back Pain and Flank Pain   HISTORY OF PRESENT ILLNESS  05/19/23 3:45 AM Cynthia Rosales is a 42 y.o. 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  She states she has had chronic neck pain since then.  She feels a crunching sensation in her neck when she moves it.  After insensate from the waist down as well as incontinent.  She is able to ambulate with a walker or use a motorized scooter.  She has also had about a week of lumbar pain.  She has also had several days of left flank pain radiating to her left upper quadrant.  This pain has been associated with nausea, vomiting and diarrhea.  She denies dysuria or hematuria.  She rates her pain as a 10 out of 10.  She is on chronic pain management and has been for a long period of time under the care of Dr. Mikeal Hawthorne.  She receives 130, 30 mg Oxy IR tablets every 22 days.   Past Medical History:  Diagnosis Date   Abscess of bursa, left elbow 06/2013   Treated with I and D/antibiotics.    Arthritis    Asthma    Depression    History of blood transfusion    "after I had one of my kids"   History of kidney stones    Hypertension    Ovarian cyst    Schizophrenia (HCC)    Sickle cell disease ruled out    Patient with no evidence of Hgb S on eletrophoresis x3   Type II diabetes mellitus (HCC)     Past Surgical History:  Procedure Laterality Date   APPENDECTOMY  2013   BILIARY STENT PLACEMENT N/A 05/02/2018   Procedure: BILIARY STENT PLACEMENT;  Surgeon: Sherrilyn Rist, MD;  Location: [facility];  Service: Gastroenterology;  Laterality: N/A;   BOWEL RESECTION  12/03/2020   Procedure: SMALL BOWEL RESECTION;  Surgeon: Sheliah Hatch, De Blanch, MD;  Location: [facility] OR;  Service: General;;   CARPAL TUNNEL  RELEASE     CESAREAN SECTION  2000; 2007; 2011   CHOLECYSTECTOMY N/A 04/03/2018   Procedure: LAPAROSCOPIC CHOLECYSTECTOMY;  Surgeon: Emelia Loron, MD;  Location: [facility] OR;  Service: General;  Laterality: N/A;   DILATATION & CURETTAGE/HYSTEROSCOPY WITH MYOSURE N/A 10/22/2020   Procedure: DILATATION & CURETTAGE/HYSTEROSCOPY WITH MYOSURE;  Surgeon: Lavina Hamman, MD;  Location: [facility] OR;  Service: Gynecology;  Laterality: N/A;   DILATION AND CURETTAGE OF UTERUS     ERCP N/A 05/02/2018   Procedure: ENDOSCOPIC RETROGRADE CHOLANGIOPANCREATOGRAPHY (ERCP);  Surgeon: Sherrilyn Rist, MD;  Location: [facility];  Service: Gastroenterology;  Laterality: N/A;   ERCP N/A 07/26/2018   Procedure: ENDOSCOPIC RETROGRADE CHOLANGIOPANCREATOGRAPHY (ERCP);  Surgeon: Hilarie Fredrickson, MD;  Location: [facility];  Service: Endoscopy;  Laterality: N/A;  stent removal   IR RADIOLOGIST EVAL & MGMT  05/19/2018   LAMINECTOMY N/A 12/13/2014   Procedure: CERVICAL LAMINECTOMY FOR INTRADURAL TUMOR;  Surgeon: Temple Pacini, MD;  Location: [facility];  Service: Neurosurgery;  Laterality: N/A;  posterior   LAPAROSCOPY N/A 07/20/2017   Procedure: LAPAROSCOPY OPERATIVE WITH WEDGE RESECTION RIGHT CORNUA AND PARTIAL SALPINGECTOMY;  Surgeon: Reva Bores, MD;  Location: [facility] ORS;  Service: Gynecology;  Laterality: N/A;   REDUCTION MAMMAPLASTY Bilateral 1998  REMOVAL OF STONES  05/02/2018   Procedure: REMOVAL OF STONES;  Surgeon: Sherrilyn Rist, MD;  Location: [facility];  Service: Gastroenterology;;   Dennison Mascot  05/02/2018   Procedure: SPHINCTEROTOMY;  Surgeon: Sherrilyn Rist, MD;  Location: [facility];  Service: Gastroenterology;;   Francine Graven REMOVAL  07/26/2018   Procedure: STENT REMOVAL;  Surgeon: Hilarie Fredrickson, MD;  Location: [facility];  Service: Endoscopy;;   TOTAL LAPAROSCOPIC HYSTERECTOMY WITH SALPINGECTOMY Bilateral 12/03/2020   Procedure: ATTEMPTED  LAPAROSCOPIC, OPEN ABDOMINAL HYSTERECTOMY WITH BILATERAL  SALPINGO-OOPHERECTOMY AND LYSIS OF ADHESIONS;  Surgeon: Lavina Hamman, MD;  Location: [facility] OR;  Service: Gynecology;  Laterality: Bilateral;    Family History  Problem Relation Age of Onset   Diabetes Mother    Hypertension Mother    Breast cancer Maternal Aunt    Ovarian cancer Maternal Grandmother    Breast cancer Maternal Aunt    Migraines Neg Hx     Social History   Tobacco Use   Smoking status: Former    Years: 0    Types: Cigarettes   Smokeless tobacco: Never   Tobacco comments:    03/27/2014 "smoked ~ 1 cigarette/day; quit in ~ 2013"  Vaping Use   Vaping Use: Never used  Substance Use Topics   Alcohol use: Never    Alcohol/week: 0.0 standard drinks of alcohol   Drug use: No    Prior to Admission medications   Medication Sig Start Date End Date Taking? Authorizing Provider  estradiol (CLIMARA - DOSED IN MG/24 HR) 0.075 mg/24hr patch Place 0.075 mg onto the skin once a week. 06/12/22   [provider]  furosemide (LASIX) 40 MG tablet Take 40 mg by mouth daily. 04/15/22   [provider]  HUMALOG MIX 75/25 KWIKPEN (75-25) 100 UNIT/ML KwikPen Inject 36 Units into the skin in the morning, at noon, and at bedtime. 04/07/22   [provider]  hydrochlorothiazide (HYDRODIURIL) 25 MG tablet Take 25 mg by mouth daily.  04/27/18   [provider]  oxycodone (ROXICODONE) 30 MG immediate release tablet Take 30 mg by mouth every 4 (four) hours as needed for pain. 01/02/16   [provider]  OZEMPIC, 1 MG/DOSE, 4 MG/3ML SOPN Inject 1 mg as directed once a week. On Sunday    [provider]  sitaGLIPtin (JANUVIA) 100 MG tablet Take 100 mg by mouth daily.    [provider]    Allergies Amoxicillin, Buprenorphine hcl, and Morphine and codeine   REVIEW OF SYSTEMS  Negative except as noted here or in the History of Present Illness.   PHYSICAL EXAMINATION  Initial Vital Signs Blood pressure 115/69, pulse 87, temperature 98.7  F (37.1 C), temperature source Oral, resp. rate 18, height 5\' 3"  (1.6 m), weight (!) 149.7 kg, last menstrual period 03/21/2020, SpO2 98 %.  Examination General: Well-developed, high BMI female in no acute distress; appearance consistent with age of record HENT: normocephalic; atraumatic Eyes: Normal appearance Neck: supple Heart: regular rate and rhythm Lungs: clear to auscultation bilaterally Abdomen: soft; nondistended; left upper quadrant tenderness; bowel sounds present Back: Lumbar tenderness; left CVA tenderness Extremities: No deformity; full range of motion; pulses normal Neurologic: Awake, alert and oriented; motor function intact in all extremities and symmetric; no facial droop Skin: Warm and dry Psychiatric: Tearful   RESULTS  Summary of this visit's results, reviewed and interpreted by myself:   EKG Interpretation  Date/Time:    Ventricular Rate:    PR Interval:  QRS Duration:   QT Interval:    QTC Calculation:   R Axis:     Text Interpretation:         Laboratory Studies: Results for orders placed or performed during the hospital encounter of 05/19/23 (from the past 24 hour(s))  Comprehensive metabolic panel     Status: Abnormal   Collection Time: 05/19/23  2:00 AM  Result Value Ref Range   Sodium 135 135 - 145 mmol/L   Potassium 3.8 3.5 - 5.1 mmol/L   Chloride 102 98 - 111 mmol/L   CO2 25 22 - 32 mmol/L   Glucose, Bld 159 (H) 70 - 99 mg/dL   BUN 13 6 - 20 mg/dL   Creatinine, Ser 1.61 0.44 - 1.00 mg/dL   Calcium 8.6 (L) 8.9 - 10.3 mg/dL   Total Protein 7.7 6.5 - 8.1 g/dL   Albumin 3.2 (L) 3.5 - 5.0 g/dL   AST 19 15 - 41 U/L   ALT 13 0 - 44 U/L   Alkaline Phosphatase 80 38 - 126 U/L   Total Bilirubin 0.8 0.3 - 1.2 mg/dL   GFR, Estimated >09 >60 mL/min   Anion gap 8 5 - 15  Lipase, blood     Status: None   Collection Time: 05/19/23  2:00 AM  Result Value Ref Range   Lipase 31 11 - 51 U/L  CBC with Diff     Status: Abnormal   Collection  Time: 05/19/23  2:00 AM  Result Value Ref Range   WBC 9.0 4.0 - 10.5 K/uL   RBC 4.93 3.87 - 5.11 MIL/uL   Hemoglobin 11.6 (L) 12.0 - 15.0 g/dL   HCT 45.4 09.8 - 11.9 %   MCV 73.4 (L) 80.0 - 100.0 fL   MCH 23.5 (L) 26.0 - 34.0 pg   MCHC 32.0 30.0 - 36.0 g/dL   RDW 14.7 (H) 82.9 - 56.2 %   Platelets 311 150 - 400 K/uL   nRBC 0.0 0.0 - 0.2 %   Neutrophils Relative % 49 %   Neutro Abs 4.5 1.7 - 7.7 K/uL   Lymphocytes Relative 43 %   Lymphs Abs 3.9 0.7 - 4.0 K/uL   Monocytes Relative 5 %   Monocytes Absolute 0.5 0.1 - 1.0 K/uL   Eosinophils Relative 2 %   Eosinophils Absolute 0.2 0.0 - 0.5 K/uL   Basophils Relative 1 %   Basophils Absolute 0.1 0.0 - 0.1 K/uL   Immature Granulocytes 0 %   Abs Immature Granulocytes 0.02 0.00 - 0.07 K/uL  Urinalysis, Routine w reflex microscopic -Urine, Clean Catch     Status: Abnormal   Collection Time: 05/19/23  5:03 AM  Result Value Ref Range   Color, Urine YELLOW YELLOW   APPearance HAZY (A) CLEAR   Specific Gravity, Urine 1.024 1.005 - 1.030   pH 6.0 5.0 - 8.0   Glucose, UA NEGATIVE NEGATIVE mg/dL   Hgb urine dipstick NEGATIVE NEGATIVE   Bilirubin Urine NEGATIVE NEGATIVE   Ketones, ur NEGATIVE NEGATIVE mg/dL   Protein, ur 130 (A) NEGATIVE mg/dL   Nitrite NEGATIVE NEGATIVE   Leukocytes,Ua NEGATIVE NEGATIVE   RBC / HPF 0-5 0 - 5 RBC/hpf   WBC, UA 0-5 0 - 5 WBC/hpf   Bacteria, UA RARE (A) NONE SEEN   Squamous Epithelial / HPF 21-50 0 - 5 /HPF   Imaging Studies: No results found.  ED COURSE and MDM  Nursing notes, initial and subsequent vitals signs, including pulse oximetry, reviewed and  interpreted by myself.  Vitals:   05/19/23 0530 05/19/23 0600 05/19/23 0700 05/19/23 0812  BP: 104/61 119/76  123/64  Pulse:   97 93  Resp:    18  Temp:    97.6 F (36.4 C)  TempSrc:    Oral  SpO2:   99% 98%  Weight:      Height:       Medications  iohexol (OMNIPAQUE) 300 MG/ML solution 100 mL (has no administration in time range)  ondansetron  (ZOFRAN) injection 4 mg (4 mg Intravenous Given 05/19/23 0414)  sodium chloride 0.9 % bolus 1,000 mL (0 mLs Intravenous Stopped 05/19/23 0505)  HYDROmorphone (DILAUDID) injection 1 mg (1 mg Intravenous Given 05/19/23 0414)  HYDROmorphone (DILAUDID) injection 1 mg (1 mg Intravenous Given 05/19/23 0512)  metoCLOPramide (REGLAN) injection 10 mg (10 mg Intravenous Given 05/19/23 0512)   7:38 AM Signed out to Dr. [name]. CT results pending.  It is unclear if this represents new neck/back pain or an exacerbation of her chronic pain caused by inability to keep her oxycodone down due to a gastroenteritis.   PROCEDURES  Procedures   ED DIAGNOSES     ICD-10-CM   1. Nausea, vomiting and diarrhea  R11.2    R19.7     2. Mediastinal adenopathy  R59.0     3. Exacerbation of chronic back pain  M54.9    G89.29          Paula Libra, MD 05/21/23 2237

## 2023-05-19 NOTE — ED Notes (Signed)
Patient taken to CT at this time.

## 2023-05-19 NOTE — ED Notes (Signed)
US PIV placed. 

## 2023-05-21 ENCOUNTER — Ambulatory Visit (INDEPENDENT_AMBULATORY_CARE_PROVIDER_SITE_OTHER): Payer: 59 | Admitting: Internal Medicine

## 2023-05-21 ENCOUNTER — Encounter: Payer: Self-pay | Admitting: Internal Medicine

## 2023-05-21 VITALS — BP 110/80 | HR 114 | Ht 63.0 in | Wt 335.8 lb

## 2023-05-21 DIAGNOSIS — R590 Localized enlarged lymph nodes: Secondary | ICD-10-CM

## 2023-05-21 DIAGNOSIS — R0609 Other forms of dyspnea: Secondary | ICD-10-CM

## 2023-05-21 LAB — BRAIN NATRIURETIC PEPTIDE: Pro B Natriuretic peptide (BNP): 4 pg/mL (ref 0.0–100.0)

## 2023-05-21 NOTE — Patient Instructions (Addendum)
ICD-10-CM   1. DOE (dyspnea on exertion)  R06.09     2. Mediastinal adenopathy  R59.0       Do blood work BNP, D-dimer, angiotensin-converting enzyme, QuantiFERON gold, ANA, rheumatoid factor and CCP  -If this blood work is abnormal we might have you do another CT scan with contrast to rule out blood clot  Do PET scan  Follow-up - Return to see Dr. Levy Pupa or Dr. Audie Box with the results to discuss need for bronchoscopy versus follow-up

## 2023-05-21 NOTE — Progress Notes (Signed)
OV 05/21/2023-this not a hospital follow-up but had a new consult referred by the ER.  Subjective:  Patient ID: Cynthia Rosales, female , DOB: 1981-08-27 , age 42 y.o. , MRN: [id number] , ADDRESS: [address] [place] [address] PCP Rometta Emery, MD Patient Care Team: Rometta Emery, MD as PCP - General (Internal Medicine) [place], Dennard Schaumann, RN as [facility] Care Management  This Provider for this visit: Treatment Team:  Attending Provider: Kalman Shan, MD    05/21/2023 -   Chief Complaint  Patient presents with   Hospitalization Follow-up    Hosp. f/up fluid in lungs.     HPI Cynthia Rosales 42 y.o. -new consultation referred by ER physician Dr. Lorre Nick.  Patient's history is provided by review of the medical records in the emergency department 05/19/2023.  Patient reports that for the last few weeks she has been having some left sided back pain that appears to be in the area of the left iliac crest based on her pointing to me.  She went to the emergency department.  They did a CT spine and abdomen.  This was all negative except CT chest showed mediastinal adenopathy.  It was a CT chest did not look for PE.  There is no D-dimer.  She does report 2 to 3 weeks history of shortness of breath with exertion relieved by rest.  She says she reported this to the emergency department but I am not sure.  Remotely she has a D-dimer that was elevated.  She says at baseline she has hot flashes since hysterectomy 2 years ago.  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  She does not know if she has a past history of sarcoid.  In fact she says she never heard about this diagnosis.  She recalls the ER doctor telling her that she has fluid in the lungs but in review of the records and personal visualization of the CT chest she has mediastinal adenopathy and the lung  parenchyma itself is clear there is some nodules reported in the lower lobe but I am not sure.  Last BNP check 2019.  No prior echo. Mild anemia 05/19/2023.  Sickle cell ruled out per chart review. Chemistry normal. CT Chest W Contrast  Result Date: 05/19/2023 CLINICAL DATA:  Assess for mediastinal adenopathy EXAM: CT CHEST WITH CONTRAST TECHNIQUE: Multidetector CT imaging of the chest was performed during intravenous contrast administration. RADIATION DOSE REDUCTION: This exam was performed according to the departmental dose-optimization program which includes automated exposure control, adjustment of the mA and/or kV according to patient size and/or use of iterative reconstruction technique. CONTRAST:  OMNIPAQUE IOHEXOL 300 MG/ML  SOLN COMPARISON:  Same day CT of the abdomen and pelvis FINDINGS: Cardiovascular: Normal heart size. No pericardial effusion. Normal caliber thoracic aorta with no atherosclerotic disease. Mediastinum/Nodes: Esophagus thyroid are unremarkable. Enlarged mediastinal bilateral hilar lymph nodes. Reference right paratracheal lymph node measuring 1.9 cm on series 2 image 16 reference right hilar lymph node measuring 1.7 cm on image 29. Lungs/Pleura: Central airways are patent. Consolidation, pleural effusion or pneumothorax. Numerous scattered small solid pulmonary nodules. Largest is a 6 mm nodule of the right middle lobe located on series 4, image 71. Focal nodular pleural thickening of the left lower lobe seen on series 4 image 67-80. No pleural effusion or pneumothorax. Upper Abdomen:  See same day separately dictated CT of the abdomen and pelvis for discussion of findings below the diaphragm. Musculoskeletal: No chest wall abnormality. No acute or significant osseous findings. IMPRESSION: 1. Enlarged mediastinal and bilateral hilar lymph nodes, differential considerations include sarcoidosis, reactive adenopathy, or lymphoproliferative disease. Recommend clinical correlation  and short-term follow-up chest CT in 3 months. 2. Numerous scattered small solid pulmonary and focal nodular pleural thickening of the left lower lobe, findings are nonspecific but can be seen in the setting of sarcoidosis. Recommend attention on follow-up. Electronically Signed   By: Allegra Lai M.D.   On: 05/19/2023 09:25   CT Cervical Spine Wo Contrast  Result Date: 05/19/2023 CLINICAL DATA:  Neck pain. Prior cervical spine surgery. Prior C4 ependymoma resection. EXAM: CT CERVICAL SPINE WITHOUT CONTRAST TECHNIQUE: Multidetector CT imaging of the cervical spine was performed without intravenous contrast. Multiplanar CT image reconstructions were also generated. RADIATION DOSE REDUCTION: This exam was performed according to the departmental dose-optimization program which includes automated exposure control, adjustment of the mA and/or kV according to patient size and/or use of iterative reconstruction technique. COMPARISON:  09/25/2015 and prior cervical MRI from 09/30/2019 FINDINGS: Body habitus reduces diagnostic sensitivity and specificity. Alignment: Mild reversal the normal cervical lordosis. Skull base and vertebrae: Prior posterior decompression from C3 through the C5-6 level. Mild spurring and loss of articular space at the anterior C1-2 articulation. No fracture or acute bony findings. Soft tissues and spinal canal: Unremarkable Disc levels: Generally maintained intervertebral disc height with no findings of bony impingement in the cervical spine. Upper chest: Suspected mildly enlarged upper mediastinal adenopathy. Other: No supplemental non-categorized findings. IMPRESSION: 1. Prior posterior decompression from C3 through C5-6 associated with previous C4 pending bone more resection. 2. Mild reversal the normal cervical lordosis. 3. Suspected mildly enlarged upper mediastinal adenopathy. Chest CT is recommended for definitive assessment. Electronically Signed   By: Gaylyn Rong M.D.   On:  05/19/2023 08:09   CT ABDOMEN PELVIS W CONTRAST  Result Date: 05/19/2023 CLINICAL DATA:  Left upper quadrant abdominal pain and left flank pain over the last 4 days EXAM: CT ABDOMEN AND PELVIS WITH CONTRAST TECHNIQUE: Multidetector CT imaging of the abdomen and pelvis was performed using the standard protocol following bolus administration of intravenous contrast. RADIATION DOSE REDUCTION: This exam was performed according to the departmental dose-optimization program which includes automated exposure control, adjustment of the mA and/or kV according to patient size and/or use of iterative reconstruction technique. CONTRAST:  OMNIPAQUE IOHEXOL 300 MG/ML  SOLN COMPARISON:  07/10/2021 FINDINGS: Body habitus reduces diagnostic sensitivity and specificity. Lower chest: Unremarkable Hepatobiliary: Cholecystectomy. Common bile duct proximally 0.7 cm in diameter, likely a physiologic response to cholecystectomy. No focal hepatic parenchymal lesion identified. Pancreas: Unremarkable Spleen: Unremarkable Adrenals/Urinary Tract: Unremarkable Stomach/Bowel: Postoperative findings along left paracentral small bowel in the upper pelvis compatible with prior bowel resection. Prior appendectomy. Vascular/Lymphatic: Unremarkable Reproductive: Uterus absent.  Adnexa unremarkable. Other: No supplemental non-categorized findings. Musculoskeletal: Degenerative facet arthropathy on the right at L4-5. IMPRESSION: 1. No specific abnormality is identified to explain the patient's left upper quadrant and left flank pain. 2. Prior small bowel resection. 3. Degenerative facet arthropathy on the right at L4-5. Electronically Signed   By: Gaylyn Rong M.D.   On: 05/19/2023 08:00      PFT      No data to display             has a past medical history of Abscess of bursa, left elbow (  06/2013), Arthritis, Asthma, Depression, History of blood transfusion, History of kidney stones, Hypertension, Ovarian cyst,  Schizophrenia (HCC), Sickle cell disease ruled out, and Type II diabetes mellitus (HCC).   reports that she has quit smoking. Her smoking use included cigarettes. She has never used smokeless tobacco.  Past Surgical History:  Procedure Laterality Date   APPENDECTOMY  2013   BILIARY STENT PLACEMENT N/A 05/02/2018   Procedure: BILIARY STENT PLACEMENT;  Surgeon: Sherrilyn Rist, MD;  Location: [facility];  Service: Gastroenterology;  Laterality: N/A;   BOWEL RESECTION  12/03/2020   Procedure: SMALL BOWEL RESECTION;  Surgeon: Sheliah Hatch, De Blanch, MD;  Location: [facility] OR;  Service: General;;   CARPAL TUNNEL RELEASE     CESAREAN SECTION  2000; 2007; 2011   CHOLECYSTECTOMY N/A 04/03/2018   Procedure: LAPAROSCOPIC CHOLECYSTECTOMY;  Surgeon: Emelia Loron, MD;  Location: [facility] OR;  Service: General;  Laterality: N/A;   DILATATION & CURETTAGE/HYSTEROSCOPY WITH MYOSURE N/A 10/22/2020   Procedure: DILATATION & CURETTAGE/HYSTEROSCOPY WITH MYOSURE;  Surgeon: Lavina Hamman, MD;  Location: [facility] OR;  Service: Gynecology;  Laterality: N/A;   DILATION AND CURETTAGE OF UTERUS     ERCP N/A 05/02/2018   Procedure: ENDOSCOPIC RETROGRADE CHOLANGIOPANCREATOGRAPHY (ERCP);  Surgeon: Sherrilyn Rist, MD;  Location: [facility];  Service: Gastroenterology;  Laterality: N/A;   ERCP N/A 07/26/2018   Procedure: ENDOSCOPIC RETROGRADE CHOLANGIOPANCREATOGRAPHY (ERCP);  Surgeon: Hilarie Fredrickson, MD;  Location: [facility];  Service: Endoscopy;  Laterality: N/A;  stent removal   IR RADIOLOGIST EVAL & MGMT  05/19/2018   LAMINECTOMY N/A 12/13/2014   Procedure: CERVICAL LAMINECTOMY FOR INTRADURAL TUMOR;  Surgeon: Temple Pacini, MD;  Location: [facility];  Service: Neurosurgery;  Laterality: N/A;  posterior   LAPAROSCOPY N/A 07/20/2017   Procedure: LAPAROSCOPY OPERATIVE WITH WEDGE RESECTION RIGHT CORNUA AND PARTIAL SALPINGECTOMY;  Surgeon: Reva Bores, MD;  Location: [facility] ORS;  Service: Gynecology;  Laterality: N/A;   REDUCTION  MAMMAPLASTY Bilateral 1998   REMOVAL OF STONES  05/02/2018   Procedure: REMOVAL OF STONES;  Surgeon: Sherrilyn Rist, MD;  Location: [facility];  Service: Gastroenterology;;   Dennison Mascot  05/02/2018   Procedure: SPHINCTEROTOMY;  Surgeon: Sherrilyn Rist, MD;  Location: [facility];  Service: Gastroenterology;;   Francine Graven REMOVAL  07/26/2018   Procedure: STENT REMOVAL;  Surgeon: Hilarie Fredrickson, MD;  Location: [facility];  Service: Endoscopy;;   TOTAL LAPAROSCOPIC HYSTERECTOMY WITH SALPINGECTOMY Bilateral 12/03/2020   Procedure: ATTEMPTED  LAPAROSCOPIC, OPEN ABDOMINAL HYSTERECTOMY WITH BILATERAL SALPINGO-OOPHERECTOMY AND LYSIS OF ADHESIONS;  Surgeon: Lavina Hamman, MD;  Location: [facility] OR;  Service: Gynecology;  Laterality: Bilateral;    Allergies  Allergen Reactions   Amoxicillin Hives, Rash and Other (See Comments)    PATIENT HAS HAD A PCN REACTION WITH IMMEDIATE RASH, FACIAL/TONGUE/THROAT SWELLING, SOB, OR LIGHTHEADEDNESS WITH HYPOTENSION:  #  #  YES  #  #  Has patient had a PCN reaction causing severe rash involving mucus membranes or skin necrosis: No Has patient had a PCN reaction that required hospitalization: No  Has patient had a PCN reaction occurring within the last 10 years: #  #  #  YES  #  #  #  If all of the above answers are "NO", then may proceed with Cephalosporin use.     Buprenorphine Hcl Itching and Nausea Only   Morphine And Codeine Itching and Nausea Only     There is no immunization history on file for this patient.  Family History  Problem Relation Age of Onset   Diabetes Mother    Hypertension Mother    Breast cancer Maternal Aunt    Ovarian cancer Maternal Grandmother    Breast cancer Maternal Aunt    Migraines Neg Hx      Current Outpatient Medications:    estradiol (CLIMARA - DOSED IN MG/24 HR) 0.075 mg/24hr patch, Place 0.075 mg onto the skin once a week., Disp: , Rfl:    furosemide (LASIX) 40 MG tablet, Take 40 mg by mouth daily., Disp: , Rfl:     HUMALOG MIX 75/25 KWIKPEN (75-25) 100 UNIT/ML KwikPen, Inject 36 Units into the skin in the morning, at noon, and at bedtime., Disp: , Rfl:    hydrochlorothiazide (HYDRODIURIL) 25 MG tablet, Take 25 mg by mouth daily. , Disp: , Rfl:    oxycodone (ROXICODONE) 30 MG immediate release tablet, Take 30 mg by mouth every 4 (four) hours as needed for pain., Disp: , Rfl:    OZEMPIC, 1 MG/DOSE, 4 MG/3ML SOPN, Inject 1 mg as directed once a week. On Sunday, Disp: , Rfl:    sitaGLIPtin (JANUVIA) 100 MG tablet, Take 100 mg by mouth daily., Disp: , Rfl:       Objective:   Vitals:   05/21/23 0916  BP: 110/80  Pulse: (!) 114  SpO2: 100%  Weight: (!) 335 lb 12.8 oz (152.3 kg)  Height: 5\' 3"  (1.6 m)    Estimated body mass index is 59.48 kg/m as calculated from the following:   Height as of this encounter: 5\' 3"  (1.6 m).   Weight as of this encounter: 335 lb 12.8 oz (152.3 kg).  @WEIGHTCHANGE @  [employer]   05/21/23 0916  Weight: (!) 335 lb 12.8 oz (152.3 kg)     Physical Exam   General: No distress. obee O2 at rest: no Cane present: no Sitting in wheel chair: no Frail: no Obese: no Neuro: Alert and Oriented x 3. GCS 15. Speech normal Psych: Pleasant Resp:  Barrel Chest - no.  Wheeze - no, Crackles - no, No overt respiratory distress CVS: Normal heart sounds. Murmurs - no Ext: Stigmata of Connective Tissue Disease - no HEENT: Normal upper airway. PEERL +. No post nasal drip        Assessment:       ICD-10-CM   1. DOE (dyspnea on exertion)  R06.09     2. 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  Other differential diagnosis other.  But low probability.  Explained need for workup.    Plan:     Patient Instructions     ICD-10-CM   1. DOE (dyspnea on exertion)  R06.09     2. Mediastinal adenopathy  R59.0       Do blood work BNP, D-dimer, angiotensin-converting enzyme, QuantiFERON gold, ANA,  rheumatoid factor and CCP  -If this blood work is abnormal we might have you do another CT scan with contrast to rule out blood clot  Do PET scan  Follow-up - Return to see Dr. Levy Pupa or Dr. Audie Box with the results to discuss need for bronchoscopy versus follow-up    SIGNATURE    Dr. Kalman Shan, M.D., F.C.C.P,  Pulmonary and Critical Care Medicine Staff Physician, [facility] Director - Interstitial Lung Disease  Program  Pulmonary Fibrosis [facility] [place], [place], [address]  Pager: [phone],  If no answer or between  15:00h - 7:00h: call [phone] Telephone: [phone]  10:15 AM 05/21/2023

## 2023-05-22 LAB — ANA: Anti Nuclear Antibody (ANA): NEGATIVE

## 2023-05-23 LAB — RHEUMATOID FACTOR: Rheumatoid fact SerPl-aCnc: <10 IU/mL (ref <14)

## 2023-05-23 LAB — QUANTIFERON-TB GOLD PLUS: Mitogen-NIL: >10 IU/mL

## 2023-05-24 LAB — QUANTIFERON-TB GOLD PLUS
NIL: 0.06 IU/mL
QuantiFERON-TB Gold Plus: NEGATIVE
TB1-NIL: 0 IU/mL
TB2-NIL: 0.01 IU/mL

## 2023-05-24 LAB — ANGIOTENSIN CONVERTING ENZYME: Angiotensin-Converting Enzyme: 40 U/L (ref 9–67)

## 2023-05-24 LAB — D-DIMER, QUANTITATIVE: D-Dimer, Quant: 0.34 mcg/mL FEU (ref <0.50)

## 2023-05-24 LAB — CYCLIC CITRUL PEPTIDE ANTIBODY, IGG: Cyclic Citrullin Peptide Ab: <16 UNITS

## 2023-06-10 ENCOUNTER — Ambulatory Visit (HOSPITAL_COMMUNITY)
Admission: RE | Admit: 2023-06-10 | Discharge: 2023-06-10 | Disposition: A | Discharge status: Home / Self Care | Payer: 59 | Source: Ambulatory Visit | Attending: Internal Medicine | Admitting: Internal Medicine

## 2023-06-10 DIAGNOSIS — R0609 Other forms of dyspnea: Secondary | ICD-10-CM | POA: Diagnosis present

## 2023-06-10 DIAGNOSIS — R918 Other nonspecific abnormal finding of lung field: Secondary | ICD-10-CM | POA: Diagnosis not present

## 2023-06-10 DIAGNOSIS — I700 Atherosclerosis of aorta: Secondary | ICD-10-CM | POA: Diagnosis not present

## 2023-06-10 LAB — GLUCOSE, CAPILLARY: Glucose-Capillary: 141 mg/dL — ABNORMAL HIGH (ref 70–99)

## 2023-06-10 MED ORDER — FLUDEOXYGLUCOSE F - 18 (FDG) INJECTION
15.8000 | Freq: Once | INTRAVENOUS | Status: AC
  Administered 2023-06-10: 15.84 via INTRAVENOUS

## 2023-06-11 ENCOUNTER — Telehealth: Payer: Self-pay | Admitting: Internal Medicine

## 2023-06-11 NOTE — Progress Notes (Signed)
Lot of things NOT normal in PET scan. Please keep appt with Dr Icad 7/109/24. Will not be calling you with result   NM PET Image Initial (PI) Skull Base To Thigh  Result Date: 06/10/2023 CLINICAL DATA:  Initial treatment strategy for mediastinal lymphadenopathy. EXAM: NUCLEAR MEDICINE PET SKULL BASE TO THIGH TECHNIQUE: 15.8 mCi F-18 FDG was injected intravenously. Full-ring PET imaging was performed from the skull base to thigh after the radiotracer. CT data was obtained and used for attenuation correction and anatomic localization. Fasting blood glucose: 141 mg/dl COMPARISON:  Chest CT dated May 19, 2023 FINDINGS: Mediastinal blood pool activity: SUV max 3.3 Liver activity: SUV max NA NECK: No hypermetabolic lymph nodes in the neck. Incidental CT findings: None. CHEST: Enlarged and hypermetabolic mediastinal and bilateral hilar lymph nodes, unchanged size when compared with prior exam. Reference right paratracheal lymph node measuring 1.9 cm in short axis on series 4, image 61 with SUV max of 30. Additional small hypermetabolic right retrocrural lymph node with SUV max of 11.7 measures 9 mm on series 4, image 84. Unchanged nodular pleural thickening of the left hemithorax which demonstrates hypermetabolic activity, SUV max of 40.9. Small bilateral solid pulmonary nodules are seen, grossly stable when compared with the prior exam, below PET-CT resolution. Incidental CT findings: Mild coronary artery calcifications. ABDOMEN/PELVIS: Heterogeneous FDG uptake is seen throughout the liver spleen with no discrete hypermetabolic mass. No hypermetabolic lymph nodes in the abdomen or pelvis. Incidental CT findings: Prior cholecystectomy. Prior hysterectomy. Normal caliber thoracic aorta with mild calcified plaque. SKELETON: No focal hypermetabolic activity to suggest skeletal metastasis. Incidental CT findings: Focal area of FDG uptake involving the medial anterior left thigh with SUV max 6.8, possibly artifactual,  although superficial soft tissue lesion can not be excluded. IMPRESSION: 1. Enlarged and hypermetabolic mediastinal and bilateral hilar lymph nodes, unchanged size when compared with prior exam. Primary differential considerations include sarcoidosis or lymphoproliferative disease. 2. Hypermetabolic nodular pleural thickening of the left hemithorax and heterogeneous FDG uptake is seen throughout the liver spleen, could be due to pulmonary and hepatosplenic sarcoidosis or metastatic disease. 3. Stable small bilateral solid pulmonary nodules are seen, which are below PET-CT resolution. 4. Focal area of FDG uptake involving the medial anterior left thigh with, possibly artifactual, although superficial soft tissue lesion can not be excluded. 5. Aortic Atherosclerosis (ICD10-I70.0). Electronically Signed   By: Allegra Lai M.D.   On: 06/10/2023 12:54

## 2023-06-11 NOTE — Telephone Encounter (Signed)
Cynthia Rosales  She is seeing yo for consideration of EBUS. Appt on 06/18/23. PET scan appears to have lot abnormal. D-dimer, ACE, ANA, RA, RF and Quant gold negative  Thanks    SIGNATURE    Dr. Kalman Shan, M.D., F.C.C.P,  Pulmonary and Critical Care Medicine Staff Physician, [facility] Director - Interstitial Lung Disease  Program  Pulmonary Fibrosis [facility] at [facility] [place], [place], [address]   Pager: [phone], If no answer  -> Check AMION or Try [phone] Telephone (clinical office): [phone] Telephone (research): [phone]  10:54 AM 06/11/2023    NM PET Image Initial (PI) Skull Base To Thigh  Result Date: 06/10/2023 CLINICAL DATA:  Initial treatment strategy for mediastinal lymphadenopathy. EXAM: NUCLEAR MEDICINE PET SKULL BASE TO THIGH TECHNIQUE: 15.8 mCi F-18 FDG was injected intravenously. Full-ring PET imaging was performed from the skull base to thigh after the radiotracer. CT data was obtained and used for attenuation correction and anatomic localization. Fasting blood glucose: 141 mg/dl COMPARISON:  Chest CT dated May 19, 2023 FINDINGS: Mediastinal blood pool activity: SUV max 3.3 Liver activity: SUV max NA NECK: No hypermetabolic lymph nodes in the neck. Incidental CT findings: None. CHEST: Enlarged and hypermetabolic mediastinal and bilateral hilar lymph nodes, unchanged size when compared with prior exam. Reference right paratracheal lymph node measuring 1.9 cm in short axis on series 4, image 61 with SUV max of 30. Additional small hypermetabolic right retrocrural lymph node with SUV max of 11.7 measures 9 mm on series 4, image 84. Unchanged nodular pleural thickening of the left hemithorax which demonstrates hypermetabolic activity, SUV max of 46.9. Small bilateral solid pulmonary nodules are seen, grossly stable when compared with the prior exam, below PET-CT resolution. Incidental CT findings: Mild coronary artery  calcifications. ABDOMEN/PELVIS: Heterogeneous FDG uptake is seen throughout the liver spleen with no discrete hypermetabolic mass. No hypermetabolic lymph nodes in the abdomen or pelvis. Incidental CT findings: Prior cholecystectomy. Prior hysterectomy. Normal caliber thoracic aorta with mild calcified plaque. SKELETON: No focal hypermetabolic activity to suggest skeletal metastasis. Incidental CT findings: Focal area of FDG uptake involving the medial anterior left thigh with SUV max 6.8, possibly artifactual, although superficial soft tissue lesion can not be excluded. IMPRESSION: 1. Enlarged and hypermetabolic mediastinal and bilateral hilar lymph nodes, unchanged size when compared with prior exam. Primary differential considerations include sarcoidosis or lymphoproliferative disease. 2. Hypermetabolic nodular pleural thickening of the left hemithorax and heterogeneous FDG uptake is seen throughout the liver spleen, could be due to pulmonary and hepatosplenic sarcoidosis or metastatic disease. 3. Stable small bilateral solid pulmonary nodules are seen, which are below PET-CT resolution. 4. Focal area of FDG uptake involving the medial anterior left thigh with, possibly artifactual, although superficial soft tissue lesion can not be excluded. 5. Aortic Atherosclerosis (ICD10-I70.0). Electronically Signed   By: Allegra Lai M.D.   On: 06/10/2023 12:54

## 2023-06-18 ENCOUNTER — Ambulatory Visit: Payer: 59 | Admitting: Pulmonary Disease

## 2023-06-29 ENCOUNTER — Other Ambulatory Visit: Payer: Self-pay | Admitting: Internal Medicine

## 2023-06-29 DIAGNOSIS — G9589 Other specified diseases of spinal cord: Secondary | ICD-10-CM

## 2023-07-03 ENCOUNTER — Other Ambulatory Visit (HOSPITAL_BASED_OUTPATIENT_CLINIC_OR_DEPARTMENT_OTHER): Payer: Self-pay

## 2023-07-03 MED ORDER — MOUNJARO 5 MG/0.5ML ~~LOC~~ SOAJ
5.0000 mg | SUBCUTANEOUS | 1 refills | Status: DC
  Filled 2023-07-03: qty 2, 28d supply, fill #0
  Filled 2023-07-24: qty 2, 28d supply, fill #1

## 2023-07-24 ENCOUNTER — Other Ambulatory Visit (HOSPITAL_BASED_OUTPATIENT_CLINIC_OR_DEPARTMENT_OTHER): Payer: Self-pay

## 2023-07-24 ENCOUNTER — Ambulatory Visit: Admission: RE | Admit: 2023-07-24 | Payer: 59 | Source: Ambulatory Visit

## 2023-07-24 DIAGNOSIS — G9589 Other specified diseases of spinal cord: Secondary | ICD-10-CM

## 2023-07-24 MED ORDER — MOUNJARO 5 MG/0.5ML ~~LOC~~ SOAJ
5.0000 mg | SUBCUTANEOUS | 0 refills | Status: DC
  Filled 2023-07-24: qty 2, 28d supply, fill #0

## 2023-07-24 MED ORDER — GADOPICLENOL 0.5 MMOL/ML IV SOLN
10.0000 mL | Freq: Once | INTRAVENOUS | Status: AC | PRN
  Administered 2023-07-24: 10 mL via INTRAVENOUS

## 2023-07-29 ENCOUNTER — Ambulatory Visit (INDEPENDENT_AMBULATORY_CARE_PROVIDER_SITE_OTHER): Payer: 59 | Admitting: Pulmonary Disease

## 2023-07-29 ENCOUNTER — Ambulatory Visit: Payer: 59 | Admitting: Pulmonary Disease

## 2023-07-29 ENCOUNTER — Encounter: Payer: Self-pay | Admitting: Pulmonary Disease

## 2023-07-29 VITALS — BP 140/80 | HR 104 | Ht 63.0 in | Wt 334.0 lb

## 2023-07-29 DIAGNOSIS — R599 Enlarged lymph nodes, unspecified: Secondary | ICD-10-CM

## 2023-07-29 NOTE — H&P (View-Only) (Signed)
Synopsis: Referred in August 2024 for abnormal CT chest by Cynthia Emery, MD  Subjective:   PATIENT ID: Cynthia Rosales GENDER: female DOB: [birth year], MRN: [id number]  Chief Complaint  Patient presents with   Hospitalization Follow-up    Discuss bronchoscopy    This is a 42 year old female, past medical history of asthma, type 2 diabetes.  Patient is a never smoker.  Patient is seen by Dr. Marchelle Gearing in clinic.  Patient was referred for abnormal CT imaging.  Patient was found to have enlarged hypermetabolic mediastinal and bilateral hilar adenopathy concerning for possible underlying sarcoidosis.  Was referred for consideration of bronchoscopy and biopsy.    Past Medical History:  Diagnosis Date   Abscess of bursa, left elbow 06/2013   Treated with I and D/antibiotics.    Arthritis    Asthma    Depression    History of blood transfusion    "after I had one of my kids"   History of kidney stones    Hypertension    Ovarian cyst    Schizophrenia (HCC)    Sickle cell disease ruled out    Patient with no evidence of Hgb S on eletrophoresis x3   Type II diabetes mellitus (HCC)      Family History  Problem Relation Age of Onset   Diabetes Mother    Hypertension Mother    Breast cancer Maternal Aunt    Ovarian cancer Maternal Grandmother    Breast cancer Maternal Aunt    Migraines Neg Hx      Past Surgical History:  Procedure Laterality Date   APPENDECTOMY  2013   BILIARY STENT PLACEMENT N/A 05/02/2018   Procedure: BILIARY STENT PLACEMENT;  Surgeon: Sherrilyn Rist, MD;  Location: [facility];  Service: Gastroenterology;  Laterality: N/A;   BOWEL RESECTION  12/03/2020   Procedure: SMALL BOWEL RESECTION;  Surgeon: Sheliah Hatch, De Blanch, MD;  Location: [facility] OR;  Service: General;;   CARPAL TUNNEL RELEASE     CESAREAN SECTION  2000; 2007; 2011   CHOLECYSTECTOMY N/A 04/03/2018   Procedure: LAPAROSCOPIC CHOLECYSTECTOMY;  Surgeon: Emelia Loron, MD;  Location: [facility] OR;   Service: General;  Laterality: N/A;   DILATATION & CURETTAGE/HYSTEROSCOPY WITH MYOSURE N/A 10/22/2020   Procedure: DILATATION & CURETTAGE/HYSTEROSCOPY WITH MYOSURE;  Surgeon: Lavina Hamman, MD;  Location: [facility] OR;  Service: Gynecology;  Laterality: N/A;   DILATION AND CURETTAGE OF UTERUS     ERCP N/A 05/02/2018   Procedure: ENDOSCOPIC RETROGRADE CHOLANGIOPANCREATOGRAPHY (ERCP);  Surgeon: Sherrilyn Rist, MD;  Location: [facility];  Service: Gastroenterology;  Laterality: N/A;   ERCP N/A 07/26/2018   Procedure: ENDOSCOPIC RETROGRADE CHOLANGIOPANCREATOGRAPHY (ERCP);  Surgeon: Hilarie Fredrickson, MD;  Location: [facility];  Service: Endoscopy;  Laterality: N/A;  stent removal   IR RADIOLOGIST EVAL & MGMT  05/19/2018   LAMINECTOMY N/A 12/13/2014   Procedure: CERVICAL LAMINECTOMY FOR INTRADURAL TUMOR;  Surgeon: Temple Pacini, MD;  Location: [facility];  Service: Neurosurgery;  Laterality: N/A;  posterior   LAPAROSCOPY N/A 07/20/2017   Procedure: LAPAROSCOPY OPERATIVE WITH WEDGE RESECTION RIGHT CORNUA AND PARTIAL SALPINGECTOMY;  Surgeon: Reva Bores, MD;  Location: [facility];  Service: Gynecology;  Laterality: N/A;   REDUCTION MAMMAPLASTY Bilateral 1998   REMOVAL OF STONES  05/02/2018   Procedure: REMOVAL OF STONES;  Surgeon: Sherrilyn Rist, MD;  Location: [facility];  Service: Gastroenterology;;   Dennison Mascot  05/02/2018   Procedure: SPHINCTEROTOMY;  Surgeon: Sherrilyn Rist, MD;  Location: [facility];  Service: Gastroenterology;;   Francine Graven REMOVAL  07/26/2018   Procedure: STENT REMOVAL;  Surgeon: Hilarie Fredrickson, MD;  Location: [facility];  Service: Endoscopy;;   TOTAL LAPAROSCOPIC HYSTERECTOMY WITH SALPINGECTOMY Bilateral 12/03/2020   Procedure: ATTEMPTED  LAPAROSCOPIC, OPEN ABDOMINAL HYSTERECTOMY WITH BILATERAL SALPINGO-OOPHERECTOMY AND LYSIS OF ADHESIONS;  Surgeon: Lavina Hamman, MD;  Location: [facility] OR;  Service: Gynecology;  Laterality: Bilateral;    Social History   Socioeconomic History    Marital status: Single    Spouse name: Not on file   Number of children: 3   Years of education: 11   Highest education level: Not on file  Occupational History   Occupation: Unemployed   Tobacco Use   Smoking status: Former    Types: Cigarettes   Smokeless tobacco: Never   Tobacco comments:    03/27/2014 "smoked ~ 1 cigarette/day; quit in ~ 2013"  Vaping Use   Vaping status: Never Used  Substance and Sexual Activity   Alcohol use: Never    Alcohol/week: 0.0 standard drinks of alcohol   Drug use: No   Sexual activity: Yes    Birth control/protection: None  Other Topics Concern   Not on file  Social History Narrative   Patient lives at home with boyfriend.    Patient has 3 children    Patient does not work    Patient has an 11th grade education    Patient is right handed    Social Determinants of Health   Financial Resource Strain: Not on file  Food Insecurity: No Food Insecurity (01/26/2022)   Hunger Vital Sign    Worried About Running Out of Food in the Last Year: Never true    Ran Out of Food in the Last Year: Never true  Transportation Needs: No Transportation Needs (01/26/2022)   PRAPARE - Administrator, Civil Service (Medical): No    Lack of Transportation (Non-Medical): No  Physical Activity: Not on file  Stress: Not on file  Social Connections: Unknown (05/17/2023)   Received from [facility], [facility]   Social Network    Social Network: Not on file  Intimate Partner Violence: Unknown (05/17/2023)   Received from [facility], [facility]   HITS    Physically Hurt: Not on file    Insult or Talk Down To: Not on file    Threaten Physical Harm: Not on file    Scream or Curse: Not on file     Allergies  Allergen Reactions   Amoxicillin Hives, Rash and Other (See Comments)    PATIENT HAS HAD A PCN REACTION WITH IMMEDIATE RASH, FACIAL/TONGUE/THROAT SWELLING, SOB, OR LIGHTHEADEDNESS WITH HYPOTENSION:  #  #  YES  #  #  Has patient had a  PCN reaction causing severe rash involving mucus membranes or skin necrosis: No Has patient had a PCN reaction that required hospitalization: No  Has patient had a PCN reaction occurring within the last 10 years: #  #  #  YES  #  #  #  If all of the above answers are "NO", then may proceed with Cephalosporin use.     Buprenorphine Hcl Itching and Nausea Only   Morphine And Codeine Itching and Nausea Only     Outpatient Medications Prior to Visit  Medication Sig Dispense Refill   estradiol (CLIMARA - DOSED IN MG/24 HR) 0.075 mg/24hr patch Place 0.075 mg onto the skin once a week.     furosemide (LASIX) 40 MG  tablet Take 40 mg by mouth daily.     HUMALOG MIX 75/25 KWIKPEN (75-25) 100 UNIT/ML KwikPen Inject 36 Units into the skin in the morning, at noon, and at bedtime.     hydrochlorothiazide (HYDRODIURIL) 25 MG tablet Take 25 mg by mouth daily.      oxycodone (ROXICODONE) 30 MG immediate release tablet Take 30 mg by mouth every 4 (four) hours as needed for pain.     sitaGLIPtin (JANUVIA) 100 MG tablet Take 100 mg by mouth daily.     tirzepatide [facility] 5 MG/0.5ML Pen Inject 5 mg into the skin once a week. 2 mL 1   OZEMPIC, 1 MG/DOSE, 4 MG/3ML SOPN Inject 1 mg as directed once a week. On Sunday (Patient not taking: Reported on 07/29/2023)     tirzepatide [facility]) 5 MG/0.5ML Pen Inject 5 mg into the skin once a week. 2 mL 0   No facility-administered medications prior to visit.    Review of Systems  Constitutional:  Negative for chills, fever, malaise/fatigue and weight loss.  HENT:  Negative for hearing loss, sore throat and tinnitus.   Eyes:  Negative for blurred vision and double vision.  Respiratory:  Positive for shortness of breath. Negative for cough, hemoptysis, sputum production, wheezing and stridor.   Cardiovascular:  Negative for chest pain, palpitations, orthopnea, leg swelling and PND.  Gastrointestinal:  Negative for abdominal pain, constipation, diarrhea, heartburn,  nausea and vomiting.  Genitourinary:  Negative for dysuria, hematuria and urgency.  Musculoskeletal:  Negative for joint pain and myalgias.  Skin:  Negative for itching and rash.  Neurological:  Negative for dizziness, tingling, weakness and headaches.  Endo/Heme/Allergies:  Negative for environmental allergies. Does not bruise/bleed easily.  Psychiatric/Behavioral:  Negative for depression. The patient is not nervous/anxious and does not have insomnia.   All other systems reviewed and are negative.    Objective:  Physical Exam Vitals reviewed.  Constitutional:      General: She is not in acute distress.    Appearance: She is well-developed. She is obese.  HENT:     Head: Normocephalic and atraumatic.  Eyes:     General: No scleral icterus.    Conjunctiva/sclera: Conjunctivae normal.     Pupils: Pupils are equal, round, and reactive to light.  Neck:     Vascular: No JVD.     Trachea: No tracheal deviation.  Cardiovascular:     Rate and Rhythm: Normal rate and regular rhythm.     Heart sounds: Normal heart sounds. No murmur heard. Pulmonary:     Effort: Pulmonary effort is normal. No tachypnea, accessory muscle usage or respiratory distress.     Breath sounds: No stridor. No wheezing, rhonchi or rales.  Abdominal:     General: There is no distension.     Palpations: Abdomen is soft.     Tenderness: There is no abdominal tenderness.  Musculoskeletal:        General: No tenderness.     Cervical back: Neck supple.  Lymphadenopathy:     Cervical: No cervical adenopathy.  Skin:    General: Skin is warm and dry.     Capillary Refill: Capillary refill takes less than 2 seconds.     Findings: No rash.  Neurological:     Mental Status: She is alert and oriented to person, place, and time.  Psychiatric:        Behavior: Behavior normal.      Vitals:   07/29/23 1455  BP: (!) 140/80  Pulse: Marland Kitchen)  104  SpO2: 95%  Weight: (!) 334 lb (151.5 kg)  Height: 5\' 3"  (1.6 m)   95%  on RA BMI Readings from Last 3 Encounters:  07/29/23 59.17 kg/m  05/21/23 59.48 kg/m  05/19/23 58.46 kg/m   Wt Readings from Last 3 Encounters:  07/29/23 (!) 334 lb (151.5 kg)  05/21/23 (!) 335 lb 12.8 oz (152.3 kg)  05/19/23 (!) 330 lb (149.7 kg)     CBC    Component Value Date/Time   WBC 9.0 05/19/2023 0200   RBC 4.93 05/19/2023 0200   HGB 11.6 (L) 05/19/2023 0200   HCT 36.2 05/19/2023 0200   PLT 311 05/19/2023 0200   MCV 73.4 (L) 05/19/2023 0200   MCH 23.5 (L) 05/19/2023 0200   MCHC 32.0 05/19/2023 0200   RDW 17.4 (H) 05/19/2023 0200   LYMPHSABS 3.9 05/19/2023 0200   MONOABS 0.5 05/19/2023 0200   EOSABS 0.2 05/19/2023 0200   BASOSABS 0.1 05/19/2023 0200      Chest Imaging:  Nuclear medicine pet imaging: Patient with hypermetabolic adenopathy within the mediastinum. The patient's images have been independently reviewed by me.    Pulmonary Functions Testing Results:     No data to display          FeNO:   Pathology:   Echocardiogram:   Heart Catheterization:     Assessment & Plan:     ICD-10-CM   1. Adenopathy  R59.9 Procedural/ Surgical Case Request: VIDEO BRONCHOSCOPY WITH ENDOBRONCHIAL ULTRASOUND    Ambulatory referral to Pulmonology      Discussion:  This is a 42 year old female with a hypermetabolic adenopathy within the mediastinum concerning for possible lymphoproliferative disorder versus sarcoidosis.  She thinks her father may have had a history of sarcoidosis.  Plan: Discussed in the clinic today risk-benefit alternatives to consideration of bronchoscopy with endobronchial ultrasound. We will plan for this on September 10. Patient is agreeable to proceed. She is not on any blood thinners or antiplatelets.  She does however take Ozempic and will need to hold this 1 week prior   Current Outpatient Medications:    estradiol (CLIMARA - DOSED IN MG/24 HR) 0.075 mg/24hr patch, Place 0.075 mg onto the skin once a week., Disp: , Rfl:     furosemide (LASIX) 40 MG tablet, Take 40 mg by mouth daily., Disp: , Rfl:    HUMALOG MIX 75/25 KWIKPEN (75-25) 100 UNIT/ML KwikPen, Inject 36 Units into the skin in the morning, at noon, and at bedtime., Disp: , Rfl:    hydrochlorothiazide (HYDRODIURIL) 25 MG tablet, Take 25 mg by mouth daily. , Disp: , Rfl:    oxycodone (ROXICODONE) 30 MG immediate release tablet, Take 30 mg by mouth every 4 (four) hours as needed for pain., Disp: , Rfl:    sitaGLIPtin (JANUVIA) 100 MG tablet, Take 100 mg by mouth daily., Disp: , Rfl:    tirzepatide (MOUNJARO) 5 MG/0.5ML Pen, Inject 5 mg into the skin once a week., Disp: 2 mL, Rfl: 1   OZEMPIC, 1 MG/DOSE, 4 MG/3ML SOPN, Inject 1 mg as directed once a week. On Sunday (Patient not taking: Reported on 07/29/2023), Disp: , Rfl:    Josephine Igo, DO [facility] 07/29/2023 3:11 PM

## 2023-07-29 NOTE — Progress Notes (Signed)
Synopsis: Referred in August 2024 for abnormal CT chest by Cynthia Emery, MD  Subjective:   PATIENT ID: Cynthia Rosales GENDER: female DOB: [birth year], MRN: [id number]  Chief Complaint  Patient presents with   Hospitalization Follow-up    Discuss bronchoscopy    This is a 42 year old female, past medical history of asthma, type 2 diabetes.  Patient is a never smoker.  Patient is seen by Dr. Marchelle Rosales in clinic.  Patient was referred for abnormal CT imaging.  Patient was found to have enlarged hypermetabolic mediastinal and bilateral hilar adenopathy concerning for possible underlying sarcoidosis.  Was referred for consideration of bronchoscopy and biopsy.    Past Medical History:  Diagnosis Date   Abscess of bursa, left elbow 06/2013   Treated with I and D/antibiotics.    Arthritis    Asthma    Depression    History of blood transfusion    "after I had one of my kids"   History of kidney stones    Hypertension    Ovarian cyst    Schizophrenia (HCC)    Sickle cell disease ruled out    Patient with no evidence of Hgb S on eletrophoresis x3   Type II diabetes mellitus (HCC)      Family History  Problem Relation Age of Onset   Diabetes Mother    Hypertension Mother    Breast cancer Maternal Aunt    Ovarian cancer Maternal Grandmother    Breast cancer Maternal Aunt    Migraines Neg Hx      Past Surgical History:  Procedure Laterality Date   APPENDECTOMY  2013   BILIARY STENT PLACEMENT N/A 05/02/2018   Procedure: BILIARY STENT PLACEMENT;  Surgeon: Cynthia Rist, MD;  Location: [facility];  Service: Gastroenterology;  Laterality: N/A;   BOWEL RESECTION  12/03/2020   Procedure: SMALL BOWEL RESECTION;  Surgeon: Cynthia Rosales, De Blanch, MD;  Location: [facility] OR;  Service: General;;   CARPAL TUNNEL RELEASE     CESAREAN SECTION  2000; 2007; 2011   CHOLECYSTECTOMY N/A 04/03/2018   Procedure: LAPAROSCOPIC CHOLECYSTECTOMY;  Surgeon: Cynthia Loron, MD;  Location: [facility] OR;   Service: General;  Laterality: N/A;   DILATATION & CURETTAGE/HYSTEROSCOPY WITH MYOSURE N/A 10/22/2020   Procedure: DILATATION & CURETTAGE/HYSTEROSCOPY WITH MYOSURE;  Surgeon: Cynthia Hamman, MD;  Location: [facility] OR;  Service: Gynecology;  Laterality: N/A;   DILATION AND CURETTAGE OF UTERUS     ERCP N/A 05/02/2018   Procedure: ENDOSCOPIC RETROGRADE CHOLANGIOPANCREATOGRAPHY (ERCP);  Surgeon: Cynthia Rist, MD;  Location: [facility];  Service: Gastroenterology;  Laterality: N/A;   ERCP N/A 07/26/2018   Procedure: ENDOSCOPIC RETROGRADE CHOLANGIOPANCREATOGRAPHY (ERCP);  Surgeon: Cynthia Fredrickson, MD;  Location: [facility];  Service: Endoscopy;  Laterality: N/A;  stent removal   IR RADIOLOGIST EVAL & MGMT  05/19/2018   LAMINECTOMY N/A 12/13/2014   Procedure: CERVICAL LAMINECTOMY FOR INTRADURAL TUMOR;  Surgeon: Cynthia Pacini, MD;  Location: [facility];  Service: Neurosurgery;  Laterality: N/A;  posterior   LAPAROSCOPY N/A 07/20/2017   Procedure: LAPAROSCOPY OPERATIVE WITH WEDGE RESECTION RIGHT CORNUA AND PARTIAL SALPINGECTOMY;  Surgeon: Cynthia Bores, MD;  Location: [facility];  Service: Gynecology;  Laterality: N/A;   REDUCTION MAMMAPLASTY Bilateral 1998   REMOVAL OF STONES  05/02/2018   Procedure: REMOVAL OF STONES;  Surgeon: Cynthia Rist, MD;  Location: [facility];  Service: Gastroenterology;;   Cynthia Rosales  05/02/2018   Procedure: SPHINCTEROTOMY;  Surgeon: Cynthia Rist, MD;  Location: [facility];  Service: Gastroenterology;;   Cynthia Rosales REMOVAL  07/26/2018   Procedure: STENT REMOVAL;  Surgeon: Cynthia Fredrickson, MD;  Location: [facility];  Service: Endoscopy;;   TOTAL LAPAROSCOPIC HYSTERECTOMY WITH SALPINGECTOMY Bilateral 12/03/2020   Procedure: ATTEMPTED  LAPAROSCOPIC, OPEN ABDOMINAL HYSTERECTOMY WITH BILATERAL SALPINGO-OOPHERECTOMY AND LYSIS OF ADHESIONS;  Surgeon: Cynthia Hamman, MD;  Location: [facility] OR;  Service: Gynecology;  Laterality: Bilateral;    Social History   Socioeconomic History    Marital status: Single    Spouse name: Not on file   Number of children: 3   Years of education: 11   Highest education level: Not on file  Occupational History   Occupation: Unemployed   Tobacco Use   Smoking status: Former    Types: Cigarettes   Smokeless tobacco: Never   Tobacco comments:    03/27/2014 "smoked ~ 1 cigarette/day; quit in ~ 2013"  Vaping Use   Vaping status: Never Used  Substance and Sexual Activity   Alcohol use: Never    Alcohol/week: 0.0 standard drinks of alcohol   Drug use: No   Sexual activity: Yes    Birth control/protection: None  Other Topics Concern   Not on file  Social History Narrative   Patient lives at home with boyfriend.    Patient has 3 children    Patient does not work    Patient has an 11th grade education    Patient is right handed    Social Determinants of Health   Financial Resource Strain: Not on file  Food Insecurity: No Food Insecurity (01/26/2022)   Hunger Vital Sign    Worried About Running Out of Food in the Last Year: Never true    Ran Out of Food in the Last Year: Never true  Transportation Needs: No Transportation Needs (01/26/2022)   PRAPARE - Administrator, Civil Service (Medical): No    Lack of Transportation (Non-Medical): No  Physical Activity: Not on file  Stress: Not on file  Social Connections: Unknown (05/17/2023)   Received from [facility], [facility]   Social Network    Social Network: Not on file  Intimate Partner Violence: Unknown (05/17/2023)   Received from [facility], [facility]   HITS    Physically Hurt: Not on file    Insult or Talk Down To: Not on file    Threaten Physical Harm: Not on file    Scream or Curse: Not on file     Allergies  Allergen Reactions   Amoxicillin Hives, Rash and Other (See Comments)    PATIENT HAS HAD A PCN REACTION WITH IMMEDIATE RASH, FACIAL/TONGUE/THROAT SWELLING, SOB, OR LIGHTHEADEDNESS WITH HYPOTENSION:  #  #  YES  #  #  Has patient had a  PCN reaction causing severe rash involving mucus membranes or skin necrosis: No Has patient had a PCN reaction that required hospitalization: No  Has patient had a PCN reaction occurring within the last 10 years: #  #  #  YES  #  #  #  If all of the above answers are "NO", then may proceed with Cephalosporin use.     Buprenorphine Hcl Itching and Nausea Only   Morphine And Codeine Itching and Nausea Only     Outpatient Medications Prior to Visit  Medication Sig Dispense Refill   estradiol (CLIMARA - DOSED IN MG/24 HR) 0.075 mg/24hr patch Place 0.075 mg onto the skin once a week.     furosemide (LASIX) 40 MG  tablet Take 40 mg by mouth daily.     HUMALOG MIX 75/25 KWIKPEN (75-25) 100 UNIT/ML KwikPen Inject 36 Units into the skin in the morning, at noon, and at bedtime.     hydrochlorothiazide (HYDRODIURIL) 25 MG tablet Take 25 mg by mouth daily.      oxycodone (ROXICODONE) 30 MG immediate release tablet Take 30 mg by mouth every 4 (four) hours as needed for pain.     sitaGLIPtin (JANUVIA) 100 MG tablet Take 100 mg by mouth daily.     tirzepatide [facility] 5 MG/0.5ML Pen Inject 5 mg into the skin once a week. 2 mL 1   OZEMPIC, 1 MG/DOSE, 4 MG/3ML SOPN Inject 1 mg as directed once a week. On Sunday (Patient not taking: Reported on 07/29/2023)     tirzepatide [facility]) 5 MG/0.5ML Pen Inject 5 mg into the skin once a week. 2 mL 0   No facility-administered medications prior to visit.    Review of Systems  Constitutional:  Negative for chills, fever, malaise/fatigue and weight loss.  HENT:  Negative for hearing loss, sore throat and tinnitus.   Eyes:  Negative for blurred vision and double vision.  Respiratory:  Positive for shortness of breath. Negative for cough, hemoptysis, sputum production, wheezing and stridor.   Cardiovascular:  Negative for chest pain, palpitations, orthopnea, leg swelling and PND.  Gastrointestinal:  Negative for abdominal pain, constipation, diarrhea, heartburn,  nausea and vomiting.  Genitourinary:  Negative for dysuria, hematuria and urgency.  Musculoskeletal:  Negative for joint pain and myalgias.  Skin:  Negative for itching and rash.  Neurological:  Negative for dizziness, tingling, weakness and headaches.  Endo/Heme/Allergies:  Negative for environmental allergies. Does not bruise/bleed easily.  Psychiatric/Behavioral:  Negative for depression. The patient is not nervous/anxious and does not have insomnia.   All other systems reviewed and are negative.    Objective:  Physical Exam Vitals reviewed.  Constitutional:      General: She is not in acute distress.    Appearance: She is well-developed. She is obese.  HENT:     Head: Normocephalic and atraumatic.  Eyes:     General: No scleral icterus.    Conjunctiva/sclera: Conjunctivae normal.     Pupils: Pupils are equal, round, and reactive to light.  Neck:     Vascular: No JVD.     Trachea: No tracheal deviation.  Cardiovascular:     Rate and Rhythm: Normal rate and regular rhythm.     Heart sounds: Normal heart sounds. No murmur heard. Pulmonary:     Effort: Pulmonary effort is normal. No tachypnea, accessory muscle usage or respiratory distress.     Breath sounds: No stridor. No wheezing, rhonchi or rales.  Abdominal:     General: There is no distension.     Palpations: Abdomen is soft.     Tenderness: There is no abdominal tenderness.  Musculoskeletal:        General: No tenderness.     Cervical back: Neck supple.  Lymphadenopathy:     Cervical: No cervical adenopathy.  Skin:    General: Skin is warm and dry.     Capillary Refill: Capillary refill takes less than 2 seconds.     Findings: No rash.  Neurological:     Mental Status: She is alert and oriented to person, place, and time.  Psychiatric:        Behavior: Behavior normal.      Vitals:   07/29/23 1455  BP: (!) 140/80  Pulse: Marland Kitchen)  104  SpO2: 95%  Weight: (!) 334 lb (151.5 kg)  Height: 5\' 3"  (1.6 m)   95%  on RA BMI Readings from Last 3 Encounters:  07/29/23 59.17 kg/m  05/21/23 59.48 kg/m  05/19/23 58.46 kg/m   Wt Readings from Last 3 Encounters:  07/29/23 (!) 334 lb (151.5 kg)  05/21/23 (!) 335 lb 12.8 oz (152.3 kg)  05/19/23 (!) 330 lb (149.7 kg)     CBC    Component Value Date/Time   WBC 9.0 05/19/2023 0200   RBC 4.93 05/19/2023 0200   HGB 11.6 (L) 05/19/2023 0200   HCT 36.2 05/19/2023 0200   PLT 311 05/19/2023 0200   MCV 73.4 (L) 05/19/2023 0200   MCH 23.5 (L) 05/19/2023 0200   MCHC 32.0 05/19/2023 0200   RDW 17.4 (H) 05/19/2023 0200   LYMPHSABS 3.9 05/19/2023 0200   MONOABS 0.5 05/19/2023 0200   EOSABS 0.2 05/19/2023 0200   BASOSABS 0.1 05/19/2023 0200      Chest Imaging:  Nuclear medicine pet imaging: Patient with hypermetabolic adenopathy within the mediastinum. The patient's images have been independently reviewed by me.    Pulmonary Functions Testing Results:     No data to display          FeNO:   Pathology:   Echocardiogram:   Heart Catheterization:     Assessment & Plan:     ICD-10-CM   1. Adenopathy  R59.9 Procedural/ Surgical Case Request: VIDEO BRONCHOSCOPY WITH ENDOBRONCHIAL ULTRASOUND    Ambulatory referral to Pulmonology      Discussion:  This is a 42 year old female with a hypermetabolic adenopathy within the mediastinum concerning for possible lymphoproliferative disorder versus sarcoidosis.  She thinks her father may have had a history of sarcoidosis.  Plan: Discussed in the clinic today risk-benefit alternatives to consideration of bronchoscopy with endobronchial ultrasound. We will plan for this on September 10. Patient is agreeable to proceed. She is not on any blood thinners or antiplatelets.  She does however take Ozempic and will need to hold this 1 week prior   Current Outpatient Medications:    estradiol (CLIMARA - DOSED IN MG/24 HR) 0.075 mg/24hr patch, Place 0.075 mg onto the skin once a week., Disp: , Rfl:     furosemide (LASIX) 40 MG tablet, Take 40 mg by mouth daily., Disp: , Rfl:    HUMALOG MIX 75/25 KWIKPEN (75-25) 100 UNIT/ML KwikPen, Inject 36 Units into the skin in the morning, at noon, and at bedtime., Disp: , Rfl:    hydrochlorothiazide (HYDRODIURIL) 25 MG tablet, Take 25 mg by mouth daily. , Disp: , Rfl:    oxycodone (ROXICODONE) 30 MG immediate release tablet, Take 30 mg by mouth every 4 (four) hours as needed for pain., Disp: , Rfl:    sitaGLIPtin (JANUVIA) 100 MG tablet, Take 100 mg by mouth daily., Disp: , Rfl:    tirzepatide (MOUNJARO) 5 MG/0.5ML Pen, Inject 5 mg into the skin once a week., Disp: 2 mL, Rfl: 1   OZEMPIC, 1 MG/DOSE, 4 MG/3ML SOPN, Inject 1 mg as directed once a week. On Sunday (Patient not taking: Reported on 07/29/2023), Disp: , Rfl:    Josephine Igo, DO [facility] 07/29/2023 3:11 PM

## 2023-07-29 NOTE — Patient Instructions (Signed)
Thank you for visiting Dr. Tonia Brooms at [facility]. Today we recommend the following: Orders Placed This Encounter  Procedures   Procedural/ Surgical Case Request: VIDEO BRONCHOSCOPY WITH ENDOBRONCHIAL ULTRASOUND   Ambulatory referral to Pulmonology    Return in about 19 days (around 08/17/2023) for with Kandice Robinsons, NP, after Bronchoscopy.    Please do your part to reduce the spread of COVID-19.

## 2023-08-09 ENCOUNTER — Other Ambulatory Visit: Payer: Self-pay

## 2023-08-09 ENCOUNTER — Encounter (HOSPITAL_COMMUNITY): Payer: Self-pay | Admitting: Pulmonary Disease

## 2023-08-09 NOTE — Progress Notes (Signed)
SDW CALL  Patient was given pre-op instructions over the phone. The opportunity was given for the patient to ask questions. No further questions asked. Patient verbalized understanding of instructions given.   PCP - Dava Najjar Cardiologist - denies Pulmonology - Murali Ramaswamy,MD  PPM/ICD - denies Device Orders -  Rep Notified -   Chest x-ray - 07/18/22 EKG - DOS Stress Test - denies ECHO - denies Cardiac Cath - denies  Sleep Study - denies CPAP -   Fasting Blood Sugar - 110-140 Checks Blood Sugar _4 times a day- right arm Freestyle Libre  Blood Thinner Instructions: Aspirin Instructions:  ERAS Protcol -no PRE-SURGERY Ensure or G2-   COVID TEST- na   Anesthesia review: no  Patient denies shortness of breath, fever, cough and chest pain over the phone call    Surgical Instructions    Your procedure is scheduled on September 10.  Report to [facility] Main Entrance "A" at 0945 A.M., then check in with the Admitting office.  Call this number if you have problems the morning of surgery:  [phone]    Remember:  Do not eat or drink anything after midnight the night before your surgery    Take these medicines the morning of surgery with A SIP OF WATER: oxycodone if needed   As of today, STOP taking any Aspirin (unless otherwise instructed by your surgeon) Aleve, Naproxen, Ibuprofen, Motrin, Advil, Goody's, BC's, all herbal medications, fish oil, and all vitamins.  WHAT DO I DO ABOUT MY DIABETES MEDICATION?   Do not take oral diabetes medicines (pills) the morning of surgery. Do not take Januvia the day of surgery THE NIGHT BEFORE SURGERY, take 10 units of Humalog 75/25(70%of your normal dose of 14 units)insulin. Do not take Humalog 75/25 the day of surgery.       HOW TO MANAGE YOUR DIABETES BEFORE AND AFTER SURGERY   How do I manage my blood sugar before surgery?  Check your blood sugar the morning of your surgery when you wake up and every  2 hours until you get to the Short Stay unit.  If your blood sugar is less than 70 mg/dL, you will need to treat for low blood sugar: Do not take insulin. Treat a low blood sugar (less than 70 mg/dL) with  cup of clear juice (cranberry or apple), 4 glucose tablets, OR glucose gel. Recheck blood sugar in 15 minutes after treatment (to make sure it is greater than 70 mg/dL). If your blood sugar is not greater than 70 mg/dL on recheck, call [phone] for further instructions. Report your blood sugar to the short stay nurse when you get to Short Stay.  [facility] is not responsible for any belongings or valuables. .   Do NOT Smoke (Tobacco/Vaping)  24 hours prior to your procedure  If you use a CPAP at night, you may bring your mask for your overnight stay.   Contacts, glasses, hearing aids, dentures or partials may not be worn into surgery, please bring cases for these belongings   Patients discharged the day of surgery will not be allowed to drive home, and someone needs to stay with them for 24 hours.    Special instructions:    Oral Hygiene is also important to reduce your risk of infection.  Remember - BRUSH YOUR TEETH THE MORNING OF SURGERY WITH YOUR REGULAR TOOTHPASTE   Day of Surgery:  Take a shower the day of or night before with antibacterial soap. Wear Clean/Comfortable clothing the morning  of surgery Do not apply any deodorants/lotions.   Do not wear jewelry or makeup Do not wear lotions, powders, perfumes/colognes, or deodorant. Do not shave 48 hours prior to surgery.  Men may shave face and neck. Do not bring valuables to the hospital. Do not wear nail polish, gel polish, artificial nails, or any other type of covering on natural nails (fingers and toes) If you have artificial nails or gel coating that need to be removed by a nail salon, please have this removed prior to surgery. Artificial nails or gel coating may interfere with anesthesia's ability to adequately  monitor your vital signs. Remember to brush your teeth WITH YOUR REGULAR TOOTHPASTE.

## 2023-08-10 ENCOUNTER — Ambulatory Visit (HOSPITAL_COMMUNITY): Payer: 59 | Admitting: Physician Assistant

## 2023-08-10 ENCOUNTER — Encounter (HOSPITAL_COMMUNITY)
Admission: RE | Disposition: A | Discharge status: Home / Self Care | Payer: Self-pay | Source: Home / Self Care | Attending: Pulmonary Disease

## 2023-08-10 ENCOUNTER — Ambulatory Visit (HOSPITAL_BASED_OUTPATIENT_CLINIC_OR_DEPARTMENT_OTHER): Payer: 59 | Admitting: Physician Assistant

## 2023-08-10 ENCOUNTER — Encounter (HOSPITAL_COMMUNITY): Payer: Self-pay | Admitting: Pulmonary Disease

## 2023-08-10 ENCOUNTER — Ambulatory Visit (HOSPITAL_COMMUNITY): Payer: 59

## 2023-08-10 ENCOUNTER — Other Ambulatory Visit: Payer: Self-pay

## 2023-08-10 ENCOUNTER — Ambulatory Visit (HOSPITAL_COMMUNITY)
Admission: RE | Admit: 2023-08-10 | Discharge: 2023-08-10 | Disposition: A | Discharge status: Home / Self Care | Payer: 59 | Attending: Pulmonary Disease | Admitting: Pulmonary Disease

## 2023-08-10 DIAGNOSIS — Z87891 Personal history of nicotine dependence: Secondary | ICD-10-CM | POA: Diagnosis not present

## 2023-08-10 DIAGNOSIS — Z7985 Long-term (current) use of injectable non-insulin antidiabetic drugs: Secondary | ICD-10-CM | POA: Diagnosis not present

## 2023-08-10 DIAGNOSIS — Z794 Long term (current) use of insulin: Secondary | ICD-10-CM | POA: Insufficient documentation

## 2023-08-10 DIAGNOSIS — Z833 Family history of diabetes mellitus: Secondary | ICD-10-CM | POA: Diagnosis not present

## 2023-08-10 DIAGNOSIS — Z7984 Long term (current) use of oral hypoglycemic drugs: Secondary | ICD-10-CM | POA: Insufficient documentation

## 2023-08-10 DIAGNOSIS — M1990 Unspecified osteoarthritis, unspecified site: Secondary | ICD-10-CM | POA: Insufficient documentation

## 2023-08-10 DIAGNOSIS — I10 Essential (primary) hypertension: Secondary | ICD-10-CM

## 2023-08-10 DIAGNOSIS — J45909 Unspecified asthma, uncomplicated: Secondary | ICD-10-CM

## 2023-08-10 DIAGNOSIS — Z8249 Family history of ischemic heart disease and other diseases of the circulatory system: Secondary | ICD-10-CM | POA: Diagnosis not present

## 2023-08-10 DIAGNOSIS — Z6841 Body Mass Index (BMI) 40.0 and over, adult: Secondary | ICD-10-CM | POA: Diagnosis not present

## 2023-08-10 DIAGNOSIS — I889 Nonspecific lymphadenitis, unspecified: Secondary | ICD-10-CM | POA: Insufficient documentation

## 2023-08-10 DIAGNOSIS — R599 Enlarged lymph nodes, unspecified: Secondary | ICD-10-CM | POA: Diagnosis present

## 2023-08-10 DIAGNOSIS — Z79899 Other long term (current) drug therapy: Secondary | ICD-10-CM | POA: Insufficient documentation

## 2023-08-10 DIAGNOSIS — R590 Localized enlarged lymph nodes: Secondary | ICD-10-CM

## 2023-08-10 DIAGNOSIS — E119 Type 2 diabetes mellitus without complications: Secondary | ICD-10-CM | POA: Insufficient documentation

## 2023-08-10 DIAGNOSIS — D869 Sarcoidosis, unspecified: Secondary | ICD-10-CM | POA: Diagnosis not present

## 2023-08-10 HISTORY — DX: Paralytic syndrome, unspecified: G83.9

## 2023-08-10 HISTORY — PX: VIDEO BRONCHOSCOPY WITH ENDOBRONCHIAL ULTRASOUND: SHX6177

## 2023-08-10 HISTORY — PX: BRONCHIAL NEEDLE ASPIRATION BIOPSY: SHX5106

## 2023-08-10 LAB — CBC
HCT: 39.3 % (ref 36.0–46.0)
Hemoglobin: 12.1 g/dL (ref 12.0–15.0)
MCH: 22.7 pg — ABNORMAL LOW (ref 26.0–34.0)
MCHC: 30.8 g/dL (ref 30.0–36.0)
MCV: 73.9 fL — ABNORMAL LOW (ref 80.0–100.0)
Platelets: 330 10*3/uL (ref 150–400)
RBC: 5.32 MIL/uL — ABNORMAL HIGH (ref 3.87–5.11)
RDW: 17.2 % — ABNORMAL HIGH (ref 11.5–15.5)
WBC: 9.5 10*3/uL (ref 4.0–10.5)
nRBC: 0 % (ref 0.0–0.2)

## 2023-08-10 LAB — BASIC METABOLIC PANEL
Anion gap: 12 (ref 5–15)
BUN: 12 mg/dL (ref 6–20)
CO2: 24 mmol/L (ref 22–32)
Calcium: 8.6 mg/dL — ABNORMAL LOW (ref 8.9–10.3)
Chloride: 100 mmol/L (ref 98–111)
Creatinine, Ser: 0.85 mg/dL (ref 0.44–1.00)
GFR, Estimated: >60 mL/min (ref >60)
Glucose, Bld: 199 mg/dL — ABNORMAL HIGH (ref 70–99)
Potassium: 3.5 mmol/L (ref 3.5–5.1)
Sodium: 136 mmol/L (ref 135–145)

## 2023-08-10 LAB — GLUCOSE, CAPILLARY: Glucose-Capillary: 189 mg/dL — ABNORMAL HIGH (ref 70–99)

## 2023-08-10 SURGERY — BRONCHOSCOPY, WITH EBUS
Anesthesia: General | Laterality: Bilateral

## 2023-08-10 MED ORDER — ONDANSETRON HCL 4 MG/2ML IJ SOLN
INTRAMUSCULAR | Status: DC | PRN
  Administered 2023-08-10: 4 mg via INTRAVENOUS

## 2023-08-10 MED ORDER — PROPOFOL 10 MG/ML IV BOLUS
INTRAVENOUS | Status: DC | PRN
  Administered 2023-08-10: 130 mg via INTRAVENOUS

## 2023-08-10 MED ORDER — ACETAMINOPHEN 500 MG PO TABS: 1000.0000 mg | ORAL_TABLET | Freq: Once | ORAL | Status: DC

## 2023-08-10 MED ORDER — AMISULPRIDE (ANTIEMETIC) 5 MG/2ML IV SOLN
INTRAVENOUS | Status: AC
  Administered 2023-08-10: 5 mg
  Filled 2023-08-10: qty 2

## 2023-08-10 MED ORDER — PROPOFOL 500 MG/50ML IV EMUL
INTRAVENOUS | Status: DC | PRN
Start: 2023-08-10 — End: 2023-08-10
  Administered 2023-08-10: 125 ug/kg/min via INTRAVENOUS

## 2023-08-10 MED ORDER — LIDOCAINE 2% (20 MG/ML) 5 ML SYRINGE
INTRAMUSCULAR | Status: DC | PRN
  Administered 2023-08-10: 60 mg via INTRAVENOUS

## 2023-08-10 MED ORDER — CHLORHEXIDINE GLUCONATE 0.12 % MT SOLN
OROMUCOSAL | Status: AC
  Administered 2023-08-10: 15 mL
  Filled 2023-08-10: qty 15

## 2023-08-10 MED ORDER — ROCURONIUM BROMIDE 10 MG/ML (PF) SYRINGE
PREFILLED_SYRINGE | INTRAVENOUS | Status: DC | PRN
  Administered 2023-08-10: 50 mg via INTRAVENOUS
  Administered 2023-08-10: 20 mg via INTRAVENOUS

## 2023-08-10 MED ORDER — FENTANYL CITRATE (PF) 100 MCG/2ML IJ SOLN
INTRAMUSCULAR | Status: AC
  Filled 2023-08-10: qty 2

## 2023-08-10 MED ORDER — AMISULPRIDE (ANTIEMETIC) 5 MG/2ML IV SOLN: 5.0000 mg | Freq: Once | INTRAVENOUS | Status: AC

## 2023-08-10 MED ORDER — LACTATED RINGERS IV SOLN: INTRAVENOUS | Status: DC

## 2023-08-10 MED ORDER — FENTANYL CITRATE (PF) 100 MCG/2ML IJ SOLN
25.0000 ug | INTRAMUSCULAR | Status: DC | PRN
  Administered 2023-08-10 (×2): 25 ug via INTRAVENOUS

## 2023-08-10 MED ORDER — SUGAMMADEX SODIUM 200 MG/2ML IV SOLN
INTRAVENOUS | Status: DC | PRN
  Administered 2023-08-10: 200 mg via INTRAVENOUS

## 2023-08-10 MED ORDER — INSULIN ASPART 100 UNIT/ML IJ SOLN: 0.0000 [IU] | INTRAMUSCULAR | Status: DC | PRN

## 2023-08-10 SURGICAL SUPPLY — 29 items
BRUSH CYTOL CELLEBRITY 1.5X140 (MISCELLANEOUS) IMPLANT
CANISTER SUCT 3000ML PPV (MISCELLANEOUS) ×2 IMPLANT
CONT SPEC 4OZ CLIKSEAL STRL BL (MISCELLANEOUS) ×2 IMPLANT
COVER BACK TABLE 60X90IN (DRAPES) ×2 IMPLANT
COVER DOME SNAP 22 D (MISCELLANEOUS) ×2 IMPLANT
FORCEPS BIOP RJ4 1.8 (CUTTING FORCEPS) IMPLANT
GAUZE SPONGE 4X4 12PLY STRL (GAUZE/BANDAGES/DRESSINGS) ×2 IMPLANT
GLOVE BIO SURGEON STRL SZ7.5 (GLOVE) ×2 IMPLANT
GOWN STRL REUS W/ TWL LRG LVL3 (GOWN DISPOSABLE) ×2 IMPLANT
GOWN STRL REUS W/TWL LRG LVL3 (GOWN DISPOSABLE) ×2
KIT CLEAN ENDO COMPLIANCE (KITS) ×4 IMPLANT
KIT TURNOVER KIT B (KITS) ×2 IMPLANT
MARKER SKIN DUAL TIP RULER LAB (MISCELLANEOUS) ×2 IMPLANT
NDL EBUS SONO TIP PENTAX (NEEDLE) ×2 IMPLANT
NEEDLE EBUS SONO TIP PENTAX (NEEDLE) ×2
NS IRRIG 1000ML POUR BTL (IV SOLUTION) ×2 IMPLANT
OIL SILICONE PENTAX (PARTS (SERVICE/REPAIRS)) ×2 IMPLANT
PAD ARMBOARD 7.5X6 YLW CONV (MISCELLANEOUS) ×4 IMPLANT
SOL ANTI FOG 6CC (MISCELLANEOUS) ×2 IMPLANT
SYR 20CC LL (SYRINGE) ×4 IMPLANT
SYR 20ML ECCENTRIC (SYRINGE) ×4 IMPLANT
SYR 50ML SLIP (SYRINGE) IMPLANT
SYR 5ML LUER SLIP (SYRINGE) ×2 IMPLANT
TOWEL OR 17X24 6PK STRL BLUE (TOWEL DISPOSABLE) ×2 IMPLANT
TRAP SPECIMEN MUCOUS 40CC (MISCELLANEOUS) IMPLANT
TUBE CONNECTING 20X1/4 (TUBING) ×4 IMPLANT
UNDERPAD 30X30 (UNDERPADS AND DIAPERS) ×2 IMPLANT
VALVE DISPOSABLE (MISCELLANEOUS) ×2 IMPLANT
WATER STERILE IRR 1000ML POUR (IV SOLUTION) ×2 IMPLANT

## 2023-08-10 NOTE — Transfer of Care (Signed)
Immediate Anesthesia Transfer of Care Note  Patient: Cynthia Rosales  Procedure(s) Performed: VIDEO BRONCHOSCOPY WITH ENDOBRONCHIAL ULTRASOUND (Bilateral) BRONCHIAL NEEDLE ASPIRATION BIOPSIES  Patient Location: PACU  Anesthesia Type:General  Level of Consciousness: awake, alert , oriented, patient cooperative, and responds to stimulation  Airway & Oxygen Therapy: Patient Spontanous Breathing  Post-op Assessment: Report given to RN and Post -op Vital signs reviewed and stable  Post vital signs: Reviewed and stable  Last Vitals:  Vitals Value Taken Time  BP 153/83 08/10/23 1223  Temp    Pulse 98 08/10/23 1225  Resp 20 08/10/23 1225  SpO2 97 % 08/10/23 1225  Vitals shown include unfiled device data.  Last Pain:  Vitals:   08/10/23 0908  TempSrc:   PainSc: 10-Worst pain ever         Complications: No notable events documented.

## 2023-08-10 NOTE — Progress Notes (Signed)
 Fentanyl wasted with Jackelyn Knife RN

## 2023-08-10 NOTE — Anesthesia Preprocedure Evaluation (Signed)
Anesthesia Evaluation  Patient identified by MRN, date of birth, ID band Patient awake    Reviewed: Allergy & Precautions, H&P , NPO status , Patient's Chart, lab work & pertinent test results  Airway Mallampati: II  TM Distance: >3 FB Neck ROM: Full    Dental no notable dental hx. (+) Teeth Intact, Dental Advisory Given   Pulmonary asthma , former smoker   Pulmonary exam normal breath sounds clear to auscultation       Cardiovascular hypertension, Pt. on medications  Rhythm:Regular Rate:Normal     Neuro/Psych  Headaches  Anxiety Depression       GI/Hepatic negative GI ROS, Neg liver ROS,,,  Endo/Other  diabetes, Insulin Dependent, Oral Hypoglycemic Agents  Morbid obesity  Renal/GU negative Renal ROS  negative genitourinary   Musculoskeletal  (+) Arthritis , Osteoarthritis,    Abdominal   Peds  Hematology  (+) Blood dyscrasia, anemia   Anesthesia Other Findings   Reproductive/Obstetrics negative OB ROS                             Anesthesia Physical Anesthesia Plan  ASA: 3  Anesthesia Plan: General   Post-op Pain Management: Tylenol PO (pre-op)*   Induction: Intravenous  PONV Risk Score and Plan: 4 or greater and Ondansetron, Dexamethasone, Propofol infusion, TIVA and Midazolam  Airway Management Planned: Oral ETT  Additional Equipment:   Intra-op Plan:   Post-operative Plan: Extubation in OR  Informed Consent: I have reviewed the patients History and Physical, chart, labs and discussed the procedure including the risks, benefits and alternatives for the proposed anesthesia with the patient or authorized representative who has indicated his/her understanding and acceptance.     Dental advisory given  Plan Discussed with: CRNA  Anesthesia Plan Comments:        Anesthesia Quick Evaluation

## 2023-08-10 NOTE — Interval H&P Note (Signed)
History and Physical Interval Note:  08/10/2023 11:08 AM  Cynthia Rosales  has presented today for surgery, with the diagnosis of adenopathy.  The various methods of treatment have been discussed with the patient and family. After consideration of risks, benefits and other options for treatment, the patient has consented to  Procedure(s): VIDEO BRONCHOSCOPY WITH ENDOBRONCHIAL ULTRASOUND (Bilateral) as a surgical intervention.  The patient's history has been reviewed, patient examined, no change in status, stable for surgery.  I have reviewed the patient's chart and labs.  Questions were answered to the patient's satisfaction.     Rachel Bo Kache Mcclurg

## 2023-08-10 NOTE — Anesthesia Postprocedure Evaluation (Signed)
Anesthesia Post Note  Patient: Cynthia Rosales  Procedure(s) Performed: VIDEO BRONCHOSCOPY WITH ENDOBRONCHIAL ULTRASOUND (Bilateral) BRONCHIAL NEEDLE ASPIRATION BIOPSIES     Patient location during evaluation: PACU Anesthesia Type: General Level of consciousness: awake and alert Pain management: pain level controlled Vital Signs Assessment: post-procedure vital signs reviewed and stable Respiratory status: spontaneous breathing, nonlabored ventilation and respiratory function stable Cardiovascular status: blood pressure returned to baseline and stable Postop Assessment: no apparent nausea or vomiting Anesthetic complications: no  No notable events documented.  Last Vitals:  Vitals:   08/10/23 1345 08/10/23 1400  BP: 134/88 116/86  Pulse: 90 92  Resp: 19 14  Temp:  37 C  SpO2: 97% 100%    Last Pain:  Vitals:   08/10/23 1400  TempSrc:   PainSc: Asleep                 Nicolae Vasek,W. EDMOND

## 2023-08-10 NOTE — Anesthesia Procedure Notes (Signed)
Procedure Name: Intubation Date/Time: 08/10/2023 11:43 AM  Performed by: Margarita Rana, CRNAPre-anesthesia Checklist: Patient identified, Patient being monitored, Timeout performed, Emergency Drugs available and Suction available Patient Re-evaluated:Patient Re-evaluated prior to induction Oxygen Delivery Method: Circle System Utilized Preoxygenation: Pre-oxygenation with 100% oxygen Induction Type: IV induction Ventilation: Mask ventilation without difficulty Laryngoscope Size: Mac and 4 Grade View: Grade I Tube type: Oral Tube size: 8.5 mm Number of attempts: 1 Airway Equipment and Method: Stylet Placement Confirmation: ETT inserted through vocal cords under direct vision, positive ETCO2 and breath sounds checked- equal and bilateral Secured at: 21 cm Tube secured with: Tape Dental Injury: Teeth and Oropharynx as per pre-operative assessment

## 2023-08-11 LAB — CYTOLOGY - NON PAP

## 2023-08-15 ENCOUNTER — Encounter (HOSPITAL_COMMUNITY): Payer: Self-pay | Admitting: Pulmonary Disease

## 2023-08-18 ENCOUNTER — Ambulatory Visit (INDEPENDENT_AMBULATORY_CARE_PROVIDER_SITE_OTHER): Payer: 59 | Admitting: Acute Care

## 2023-08-18 ENCOUNTER — Encounter: Payer: Self-pay | Admitting: Acute Care

## 2023-08-18 ENCOUNTER — Telehealth: Payer: Self-pay | Admitting: Acute Care

## 2023-08-18 VITALS — BP 128/80 | HR 108 | Resp 16 | Ht 63.0 in | Wt 330.0 lb

## 2023-08-18 DIAGNOSIS — D860 Sarcoidosis of lung: Secondary | ICD-10-CM | POA: Diagnosis not present

## 2023-08-18 DIAGNOSIS — J069 Acute upper respiratory infection, unspecified: Secondary | ICD-10-CM

## 2023-08-18 DIAGNOSIS — Z9889 Other specified postprocedural states: Secondary | ICD-10-CM | POA: Diagnosis not present

## 2023-08-18 MED ORDER — PREDNISONE 10 MG PO TABS
ORAL_TABLET | ORAL | 0 refills | Status: AC
Start: 2023-08-18 — End: ?

## 2023-08-18 MED ORDER — DOXYCYCLINE HYCLATE 100 MG PO TABS
100.0000 mg | ORAL_TABLET | Freq: Two times a day (BID) | ORAL | 0 refills | Status: AC
Start: 2023-08-18 — End: ?

## 2023-08-18 NOTE — Progress Notes (Signed)
History of Present Illness Cynthia Rosales is a 42 y.o. female former  smoker with past medical history of asthma, type 2 diabetes.She is followed by Dr. Marchelle Gearing in the clinic and was referred to Dr. Tonia Brooms for biopsy to confirm possible underlying sarcoidosis.   Synopsis 42 year old female, past medical history of asthma, type 2 diabetes. Patient is a never smoker. Patient is seen by Dr. Marchelle Gearing in clinic. Patient was referred for abnormal CT imaging. Patient was found to have enlarged hypermetabolic mediastinal and bilateral hilar adenopathy concerning for possible underlying sarcoidosis. Was referred August 2024 for consideration of bronchoscopy and biopsy. She underwent  VIDEO BRONCHOSCOPY WITH ENDOBRONCHIAL ULTRASOUND (Bilateral) BRONCHIAL NEEDLE ASPIRATION BIOPSIES on 08/10/2023. She is here today for post procedure follow up.    08/18/2023 Pt. Presents for follow up after video bronchoscopy with endobronchial ultrasound bronchial needle aspiration and biopsies on 08/10/2023.  Patient states she did well postprocedure.  She denies any hemoptysis, fever, discolored secretions, difficulty breathing, or anesthesia adverse reaction. We have reviewed the cytology of her biopsies.They confirm sarcoidosis. She has been  symptomatic. She has pain in her chest. She also has some shortness of breath. She is coughing up a lot of mucus. She states it ranges from green to yellow. She states she has been having this for about 2 months. Plan will be to refer back to Dr. Marchelle Gearing for management.As she is symptomatic, I will start prednisone until she can be seen by Dr. Marchelle Gearing. She is diabetic. I have explained to her that her blood sugars will increase on the prednisone.  I have asked her to reach out to the practitioner who manages her blood sugars so that she can let them know she is on prednisone for her sarcoidosis.  And help her manage blood sugars.  She understands she will need to check her blood  sugars more frequently while on the prednisone. We discussed that she will need to have eye exams annually and that she will be referred to cardiology for an evaluation to ensure there is no cardiac sarcoid.  I explained it is not a bad idea to have annual follow-up with cardiology to ensure that there is no cardiac involvement. I have also provided the patient with some patient education regarding what sarcoidosis is. There are no appointments with Dr. Marchelle Gearing until November 2024, as I am concerned about how patient will do on the prednisone I will see her again in 2 weeks to ensure that she is doing well and to see if she has experienced some symptom relief with treatment.  Patient is very engaged and wants to manage her sarcoidosis well.  Test Results: 08/10/2023 Cytology  FINAL MICROSCOPIC DIAGNOSIS:  A. LYMPH NODE, STATION 7, FINE NEEDLE ASPIRATION:  - Granulomatous inflammation  - Lymphoid tissue present   B. LYMPH NODE, 4R, FINE NEEDLE ASPIRATION:  - No malignant cells identified  - Benign bronchial cells present  - No lymphoid tissue present      Latest Ref Rng & Units 08/10/2023    8:52 AM 05/19/2023    2:00 AM 07/12/2022   11:54 AM  CBC  WBC 4.0 - 10.5 K/uL 9.5  9.0  12.5   Hemoglobin 12.0 - 15.0 g/dL 16.1  09.6  04.5   Hematocrit 36.0 - 46.0 % 39.3  36.2  35.0   Platelets 150 - 400 K/uL 330  311  370        Latest Ref Rng & Units 08/10/2023    8:52  AM 05/19/2023    2:00 AM 07/12/2022   11:54 AM  BMP  Glucose 70 - 99 mg/dL 621  308  99   BUN 6 - 20 mg/dL 12  13  13    Creatinine 0.44 - 1.00 mg/dL 6.57  8.46  9.62   Sodium 135 - 145 mmol/L 136  135  140   Potassium 3.5 - 5.1 mmol/L 3.5  3.8  4.0   Chloride 98 - 111 mmol/L 100  102  107   CO2 22 - 32 mmol/L 24  25  26    Calcium 8.9 - 10.3 mg/dL 8.6  8.6  8.5     BNP    Component Value Date/Time   BNP 19.9 01/09/2022 0330    ProBNP    Component Value Date/Time   PROBNP 4.0 05/21/2023 1020    PFT No results  found for: "FEV1PRE", "FEV1POST", "FVCPRE", "FVCPOST", "TLC", "DLCOUNC", "PREFEV1FVCRT", "PSTFEV1FVCRT"  MR Lumbar Spine W Wo Contrast  Result Date: 08/11/2023 CLINICAL DATA:  Low back pain radiating up the back. EXAM: MRI LUMBAR SPINE WITHOUT AND WITH CONTRAST TECHNIQUE: Multiplanar and multiecho pulse sequences of the lumbar spine were obtained without and with intravenous contrast. CONTRAST:  10 mL Vueway, 0 mL wasted COMPARISON:  07/11/2022 FINDINGS: Segmentation:  Standard. Alignment:  Physiologic. Vertebrae: No acute fracture, evidence of discitis, or aggressive bone lesion. Conus medullaris and cauda equina: Conus extends to the T12-L1 level. Conus and cauda equina appear normal. Paraspinal and other soft tissues: No acute paraspinal abnormality. Disc levels: Disc spaces: Disc desiccation at L5-S1. T12-L1: No significant disc bulge. No neural foraminal stenosis. No central canal stenosis. L1-L2: No significant disc bulge. Mild bilateral facet arthropathy. No foraminal or central canal stenosis. L2-L3: No significant disc bulge. Mild bilateral facet arthropathy. No foraminal or central canal stenosis. L3-L4: No significant disc bulge. No neural foraminal stenosis. No central canal stenosis. L4-L5: No significant disc bulge. Mild bilateral facet arthropathy and mild reactive marrow edema on the right. No foraminal or central canal stenosis. L5-S1: No significant disc bulge. Mild bilateral facet arthropathy. No foraminal or central canal stenosis. IMPRESSION: 1. No acute osseous injury of the lumbar spine. 2. No significant lumbar spine disc protrusion, foraminal stenosis or central canal stenosis. No significant interval change compared with 07/11/2022. Electronically Signed   By: Elige Ko M.D.   On: 08/11/2023 10:01   DG CHEST PORT 1 VIEW  Result Date: 08/10/2023 CLINICAL DATA:  Shortness of breath.  Status post bronchoscopy. EXAM: PORTABLE CHEST 1 VIEW COMPARISON:  X-ray 07/08/2022.  PET-CT  06/10/2019 FINDINGS: The heart size and mediastinal contours are within normal limits. Underinflation. No consolidation, pneumothorax or effusion. No edema. Overlapping cardiac leads. The visualized skeletal structures are unremarkable. IMPRESSION: Underinflation. No acute cardiopulmonary disease. No pneumothorax seen post bronchoscopy. Electronically Signed   By: Karen Kays M.D.   On: 08/10/2023 14:41     Past medical hx Past Medical History:  Diagnosis Date   Abscess of bursa, left elbow 06/2013   Treated with I and D/antibiotics.    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 Her smoking use included cigarettes. She has never used smokeless tobacco. She reports that she does not drink alcohol and does not use drugs.  Tobacco Cessation: Counseling given: Not Answered Tobacco comments: 03/27/2014 "smoked ~ 1 cigarette/day; quit in ~ 2013"   Past surgical hx, Family hx, Social hx all reviewed.  Current Outpatient Medications on File Prior to Visit  Medication Sig   estradiol (CLIMARA - DOSED IN MG/24 HR) 0.075 mg/24hr patch Place 0.075 mg onto the skin once a week.   furosemide (LASIX) 40 MG tablet Take 40 mg by mouth daily.   HUMALOG MIX 75/25 KWIKPEN (75-25) 100 UNIT/ML KwikPen  Inject 24 Units into the skin every morning.   hydrochlorothiazide (HYDRODIURIL) 25 MG tablet Take 25 mg by mouth daily.    Insulin Lispro Prot & Lispro (HUMALOG MIX 75/25 KWIKPEN) (75-25) 100 UNIT/ML Kwikpen Inject 14 Units into the skin at bedtime.   oxycodone (ROXICODONE) 30 MG immediate release tablet Take 30 mg by mouth every 4 (four) hours as needed for pain.   OZEMPIC, 1 MG/DOSE, 4 MG/3ML SOPN Inject 1 mg as directed once a week. On Sunday   sitaGLIPtin (JANUVIA) 100 MG tablet Take 100 mg by mouth daily.   tirzepatide [facility]) 5 MG/0.5ML Pen Inject 5 mg into the skin once a week.   No current facility-administered medications on file prior to visit.     Allergies  Allergen Reactions   Amoxicillin Hives, Rash and Other (See Comments)    PATIENT HAS HAD A PCN REACTION WITH IMMEDIATE RASH, FACIAL/TONGUE/THROAT SWELLING, SOB, OR LIGHTHEADEDNESS WITH HYPOTENSION:  #  #  YES  #  #  Has patient had a PCN reaction causing severe rash involving mucus membranes or skin necrosis: No Has patient had a PCN reaction that required hospitalization: No  Has patient had a PCN reaction occurring within the last 10 years: #  #  #  YES  #  #  #  If all of the above answers are "NO", then may proceed with Cephalosporin use.     Buprenorphine Hcl Itching and Nausea Only   Morphine And Codeine Itching and Nausea Only    Review Of Systems:  Constitutional:   +  weight loss, + night sweats,  + Fevers, chills, fatigue, or  lassitude.  HEENT:   No headaches,  Difficulty swallowing,  Tooth/dental problems, or  Sore throat,                No sneezing, itching, ear ache, nasal congestion, post nasal drip,   CV:  No chest pain,  Orthopnea, PND, swelling in lower extremities, anasarca, dizziness, palpitations, syncope.   GI  No heartburn, indigestion, abdominal pain, nausea, vomiting, diarrhea, change in bowel habits, loss of appetite, bloody stools.   Resp: + shortness of breath with exertion and less  at rest.  + excess mucus, + productive cough,  No non-productive cough,  No coughing up of blood.  +  change in color of mucus.  No wheezing.  No chest wall deformity  Skin: no rash or lesions.  GU: no dysuria, change in color of urine, no urgency or frequency.  No flank pain, no hematuria   MS:  No joint pain or swelling.  No decreased range of motion.  No back pain.  Psych:  No change in mood or affect. No depression or anxiety.  No memory loss.   Vital Signs BP 128/80   Pulse (!) 108   Resp 16   Ht 5\' 3"  (1.6 m)   Wt (!) 330 lb (149.7 kg)   LMP 03/21/2020   SpO2 100%   BMI 58.46 kg/m    Physical Exam:  General- No distress,  A&Ox3, pleasant ENT: No sinus tenderness, TM clear, pale nasal mucosa, no oral exudate,no post nasal drip, no LAN Cardiac: S1, S2, regular rate and rhythm, no murmur Chest: No wheeze/ rales/ dullness; no accessory muscle use, no nasal flaring, no sternal retractions, diminished per bases Abd.: Soft Non-tender, ND, BS +, Body mass index is 58.46 kg/m.  Ext: No clubbing cyanosis, edema Neuro:  normal strength, MAE x 4, A&O x 3 Skin: No rashes, warm and dry, no lesions  Psych: normal mood and behavior   Assessment/Plan Post bronchoscopy with biopsies>> increase in discolored sputum Cytology consistent with sarcoidosis + Family history + symptoms , chest pain and shortness of breath.  Plan Your biopsy was + for sarcoidosis. As you are symptomatic, I will start you on Prednisone 30 mg daily x 2 weeks. Take 3 tablets in the morning with food.  Follow up with me 10/4//2024 at 1:30 pm.  Your blood sugars will increase on this medication. Please do sugar checks frequently. Please follow up with your PCP for management of your blood sugars while on prednisone. Call if you are having any issues with this medication. I have sent in a prescription for Doxycycline . Take 1 tablet in the morning and one in the evening for 7 days.  Make sure you are eating  the Activia Yogurt or taking a probiotic while on antibiotic.  Call if you need a dose of diflucan.  You will need to have annual eye exams.  You will need to be seen by cardiology to follow you for your new diagnosis. I have referred you to cardiology. You will get a call to be seen.  I have provided you with some information about sarcoidosis.  Read through it and let me know if you have any questions. Please contact office for sooner follow up if symptoms do not improve or worsen or seek emergency care .   I spent 45 minutes dedicated to the care of this patient on the date of this encounter to include pre-visit review of records, face-to-face time with the patient discussing conditions above, post visit ordering of testing, clinical documentation with the electronic health record, making appropriate referrals as documented, and communicating necessary information to the patient's healthcare team.   Bevelyn Ngo, NP 08/18/2023  2:23 PM

## 2023-08-18 NOTE — Progress Notes (Signed)
Pathology reviewed in office visit today, consistent with Sarcoidosis   Thanks,  BLI  Josephine Igo, DO [facility] 08/18/2023 12:27 PM

## 2023-08-18 NOTE — Telephone Encounter (Signed)
Front desk (cc Ned Clines), I only have 3 patients 09/02/23. Please add her to schedule in evening. T

## 2023-08-18 NOTE — Telephone Encounter (Signed)
This is the patient you referred to Dr. Tonia Brooms for biopsy . It is + for sarcoid.She is symptomatic, so I have started her on prednisone 30 mg x 2 weeks with hope she gets some relief. You have no follow up until 10/2023. I will see her I 2 weeks to make sure she is doing better on the prednisone, but can you see if you can find a place on your schedule for her in a month or so?  She had some discolored secretions post procedure, so I have treated her with antibiotic.  Thanks

## 2023-08-18 NOTE — Patient Instructions (Addendum)
It is good to see you today. Your biopsy was + for sarcoidosis. As you are symptomatic, I will start you on Prednisone 30 mg daily x 2 weeks. Take 3 tablets in the morning with food.  Follow up with me 10/4//2024 at 1:30 pm.  Your blood sugars will increase on this medication. Please do sugar checks frequently. Please follow up with your PCP for management of your blood sugars while on prednisone. Call if you are having any issues with this medication. I have sent in a prescription for Doxycycline . Take 1 tablet in the morning and one in the evening for 7 days.  Make sure you are eating the Activia Yogurt or taking a probiotic while on antibiotic.  Call if you need a dose of diflucan.  You will need to have annual eye exams.  You will need to be seen by cardiology to follow you for your new diagnosis. I have referred you to cardiology. You will get a call to be seen.  Please contact office for sooner follow up if symptoms do not improve or worsen or seek emergency care .

## 2023-09-01 NOTE — Op Note (Signed)
Video Bronchoscopy with Endobronchial Ultrasound Procedure Note  Date of Operation: 08/10/2023  Pre-op Diagnosis: adenopathy   Post-op Diagnosis: adenopathy   Surgeon: Josephine Igo, DO   Assistants: None   Anesthesia: General endotracheal anesthesia  Operation: Flexible video fiberoptic bronchoscopy with endobronchial ultrasound and biopsies.  Estimated Blood Loss: Minimal  Complications: None   Indications and History: Cynthia Rosales is a 42 y.o. female with .  The risks, benefits, complications, treatment options and expected outcomes were discussed with the patient.  The possibilities of pneumothorax, pneumonia, reaction to medication, pulmonary aspiration, perforation of a viscus, bleeding, failure to diagnose a condition and creating a complication requiring transfusion or operation were discussed with the patient who freely signed the consent.    Description of Procedure: The patient was examined in the preoperative area and history and data from the preprocedure consultation were reviewed. It was deemed appropriate to proceed.  The patient was taken to [facility] endo 3, identified as Cynthia Rosales and the procedure verified as Flexible Video Fiberoptic Bronchoscopy.  A Time Out was held and the above information confirmed. After being taken to the operating room general anesthesia was initiated and the patient  was orally intubated. The video fiberoptic bronchoscope was introduced via the endotracheal tube and a general inspection was performed which showed normal right and left lung anatomy. The standard scope was then withdrawn and the endobronchial ultrasound was used to identify and characterize the peritracheal, hilar and bronchial lymph nodes. Inspection showed enlarged subcarinal nodes. Using real-time ultrasound guidance Wang needle biopsies were take from Station 7 nodes and were sent for cytology. The patient tolerated the procedure well without apparent complications. There  was no significant blood loss. The bronchoscope was withdrawn. Anesthesia was reversed and the patient was taken to the PACU for recovery.   Samples: 1. Wang needle biopsies from station 7 node  Plans:  The patient will be discharged from the PACU to home when recovered from anesthesia. We will review the cytology, pathology results with the patient when they become available. Outpatient followup will be with Josephine Igo, DO.   Josephine Igo, DO [facility] 09/01/2023 11:43 AM

## 2023-09-02 ENCOUNTER — Encounter: Payer: Self-pay | Admitting: Internal Medicine

## 2023-09-02 ENCOUNTER — Other Ambulatory Visit: Payer: Self-pay | Admitting: Internal Medicine

## 2023-09-02 ENCOUNTER — Ambulatory Visit: Payer: 59 | Admitting: Internal Medicine

## 2023-09-02 VITALS — BP 117/79 | HR 108 | Temp 97.9°F | Ht 63.0 in | Wt 333.0 lb

## 2023-09-02 DIAGNOSIS — Z23 Encounter for immunization: Secondary | ICD-10-CM

## 2023-09-02 DIAGNOSIS — Z7952 Long term (current) use of systemic steroids: Secondary | ICD-10-CM | POA: Diagnosis not present

## 2023-09-02 DIAGNOSIS — D860 Sarcoidosis of lung: Secondary | ICD-10-CM | POA: Diagnosis not present

## 2023-09-02 DIAGNOSIS — R053 Chronic cough: Secondary | ICD-10-CM

## 2023-09-02 NOTE — Progress Notes (Signed)
OV 05/21/2023-this not a hospital follow-up but had a new consult referred by the ER.  Subjective:  Patient ID: Cynthia Rosales, female , DOB: 02-26-1981 , age 42 y.o. , MRN: [id number] , ADDRESS: [address] [place] [address] PCP Rometta Emery, MD Patient Care Team: Rometta Emery, MD as PCP - General (Internal Medicine) [place], Dennard Schaumann, RN as [facility] Care Management  This Provider for this visit: Treatment Team:  Attending Provider: Kalman Shan, MD    05/21/2023 -   Chief Complaint  Patient presents with   Hospitalization Follow-up    Hosp. f/up fluid in lungs.     HPI Cynthia Rosales 42 y.o. -new consultation referred by ER physician Dr. Lorre Nick.  Patient's history is provided by review of the medical records in the emergency department 05/19/2023.  Patient reports that for the last few weeks she has been having some left sided back pain that appears to be in the area of the left iliac crest based on her pointing to me.  She went to the emergency department.  They did a CT spine and abdomen.  This was all negative except CT chest showed mediastinal adenopathy.  It was a CT chest did not look for PE.  There is no D-dimer.  She does report 2 to 3 weeks history of shortness of breath with exertion relieved by rest.  She says she reported this to the emergency department but I am not sure.  Remotely she has a D-dimer that was elevated.  She says at baseline she has hot flashes since hysterectomy 2 years ago.  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  She does not know if she has a past history of sarcoid.  In fact she says she never heard about this diagnosis.  She recalls the ER doctor telling her that she has fluid in the lungs but in review of the records and personal visualization of the CT chest she has mediastinal adenopathy and the lung  parenchyma itself is clear there is some nodules reported in the lower lobe but I am not sure.  Last BNP check 2019.  No prior echo. Mild anemia 05/19/2023.  Sickle cell ruled out per chart review. Chemistry normal. CT Chest W Contrast  Result Date: 05/19/2023 CLINICAL DATA:  Assess for mediastinal adenopathy EXAM: CT CHEST WITH CONTRAST TECHNIQUE: Multidetector CT imaging of the chest was performed during intravenous contrast administration. RADIATION DOSE REDUCTION: This exam was performed according to the departmental dose-optimization program which includes automated exposure control, adjustment of the mA and/or kV according to patient size and/or use of iterative reconstruction technique. CONTRAST:  OMNIPAQUE IOHEXOL 300 MG/ML  SOLN COMPARISON:  Same day CT of the abdomen and pelvis FINDINGS: Cardiovascular: Normal heart size. No pericardial effusion. Normal caliber thoracic aorta with no atherosclerotic disease. Mediastinum/Nodes: Esophagus thyroid are unremarkable. Enlarged mediastinal bilateral hilar lymph nodes. Reference right paratracheal lymph node measuring 1.9 cm on series 2 image 16 reference right hilar lymph node measuring 1.7 cm on image 29. Lungs/Pleura: Central airways are patent. Consolidation, pleural effusion or pneumothorax. Numerous scattered small solid pulmonary nodules. Largest is a 6 mm nodule of the right middle lobe located on series 4, image 71. Focal nodular pleural thickening of the left lower lobe seen on series 4 image 67-80. No pleural effusion or pneumothorax. Upper Abdomen: See  to Dr. Tonia Brooms for biopsy to confirm possible underlying  sarcoidosis.   Synopsis 42 year old female, past medical history of asthma, type 2 diabetes. Patient is a never smoker. Patient is seen by Dr. Marchelle Gearing in clinic. Patient was referred for abnormal CT imaging. Patient was found to have enlarged hypermetabolic mediastinal and bilateral hilar adenopathy concerning for possible underlying sarcoidosis. Was referred August 2024 for consideration of bronchoscopy and biopsy. She underwent  VIDEO BRONCHOSCOPY WITH ENDOBRONCHIAL ULTRASOUND (Bilateral) BRONCHIAL NEEDLE ASPIRATION BIOPSIES on 08/10/2023. She is here today for post procedure follow up.    08/18/2023 Pt. Presents for follow up after video bronchoscopy with endobronchial ultrasound bronchial needle aspiration and biopsies on 08/10/2023.  Patient states she did well postprocedure.  She denies any hemoptysis, fever, discolored secretions, difficulty breathing, or anesthesia adverse reaction. We have reviewed the cytology of her biopsies.They confirm sarcoidosis. She has been  symptomatic. She has pain in her chest. She also has some shortness of breath. She is coughing up a lot of mucus. She states it ranges from green to yellow. She states she has been having this for about 2 months. Plan will be to refer back to Dr. Marchelle Gearing for management.As she is symptomatic, I will start prednisone until she can be seen by Dr. Marchelle Gearing. She is diabetic. I have explained to her that her blood sugars will increase on the prednisone.  I have asked her to reach out to the practitioner who manages her blood sugars so that she can let them know she is on prednisone for her sarcoidosis.  And help her manage blood sugars.  She understands she will need to check her blood sugars more frequently while on the prednisone. We discussed that she will need to have eye exams annually and that she will be referred to cardiology for an evaluation to ensure there is no cardiac sarcoid.  I explained it is not a bad idea to have annual  follow-up with cardiology to ensure that there is no cardiac involvement. I have also provided the patient with some patient education regarding what sarcoidosis is. There are no appointments with Dr. Marchelle Gearing until November 2024, as I am concerned about how patient will do on the prednisone I will see her again in 2 weeks to ensure that she is doing well and to see if she has experienced some symptom relief with treatment.  Patient is very engaged and wants to manage her sarcoidosis well.  Test Results: 08/10/2023 Cytology  FINAL MICROSCOPIC DIAGNOSIS:  A. LYMPH NODE, STATION 7, FINE NEEDLE ASPIRATION:  - Granulomatous inflammation  - Lymphoid tissue present   B. LYMPH NODE, 4R, FINE NEEDLE ASPIRATION:  - No malignant cells identified  - Benign bronchial cells present  - No lymphoid tissue present    OV 09/02/2023  Subjective:  Patient ID: Cynthia Rosales, female , DOB: December 07, 1980 , age 33 y.o. , MRN: [id number] , ADDRESS: [address] [place] [address] [place] [address] PCP Rometta Emery, MD Patient Care Team: Rometta Emery, MD as PCP - General (Internal Medicine) [place], Dennard Schaumann, RN as [facility] Care Management  This Provider for this visit: Treatment Team:  Attending Provider: Kalman Shan, MD    09/02/2023 -   Chief Complaint  Patient presents with   Follow-up    Breathing is doing well. She would like a flu vaccine.      HPI Thia Olesen Buel 42 y.o. 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  Started on prednisone 08/18/2023.  Suffers  to Dr. Tonia Brooms for biopsy to confirm possible underlying  sarcoidosis.   Synopsis 42 year old female, past medical history of asthma, type 2 diabetes. Patient is a never smoker. Patient is seen by Dr. Marchelle Gearing in clinic. Patient was referred for abnormal CT imaging. Patient was found to have enlarged hypermetabolic mediastinal and bilateral hilar adenopathy concerning for possible underlying sarcoidosis. Was referred August 2024 for consideration of bronchoscopy and biopsy. She underwent  VIDEO BRONCHOSCOPY WITH ENDOBRONCHIAL ULTRASOUND (Bilateral) BRONCHIAL NEEDLE ASPIRATION BIOPSIES on 08/10/2023. She is here today for post procedure follow up.    08/18/2023 Pt. Presents for follow up after video bronchoscopy with endobronchial ultrasound bronchial needle aspiration and biopsies on 08/10/2023.  Patient states she did well postprocedure.  She denies any hemoptysis, fever, discolored secretions, difficulty breathing, or anesthesia adverse reaction. We have reviewed the cytology of her biopsies.They confirm sarcoidosis. She has been  symptomatic. She has pain in her chest. She also has some shortness of breath. She is coughing up a lot of mucus. She states it ranges from green to yellow. She states she has been having this for about 2 months. Plan will be to refer back to Dr. Marchelle Gearing for management.As she is symptomatic, I will start prednisone until she can be seen by Dr. Marchelle Gearing. She is diabetic. I have explained to her that her blood sugars will increase on the prednisone.  I have asked her to reach out to the practitioner who manages her blood sugars so that she can let them know she is on prednisone for her sarcoidosis.  And help her manage blood sugars.  She understands she will need to check her blood sugars more frequently while on the prednisone. We discussed that she will need to have eye exams annually and that she will be referred to cardiology for an evaluation to ensure there is no cardiac sarcoid.  I explained it is not a bad idea to have annual  follow-up with cardiology to ensure that there is no cardiac involvement. I have also provided the patient with some patient education regarding what sarcoidosis is. There are no appointments with Dr. Marchelle Gearing until November 2024, as I am concerned about how patient will do on the prednisone I will see her again in 2 weeks to ensure that she is doing well and to see if she has experienced some symptom relief with treatment.  Patient is very engaged and wants to manage her sarcoidosis well.  Test Results: 08/10/2023 Cytology  FINAL MICROSCOPIC DIAGNOSIS:  A. LYMPH NODE, STATION 7, FINE NEEDLE ASPIRATION:  - Granulomatous inflammation  - Lymphoid tissue present   B. LYMPH NODE, 4R, FINE NEEDLE ASPIRATION:  - No malignant cells identified  - Benign bronchial cells present  - No lymphoid tissue present    OV 09/02/2023  Subjective:  Patient ID: Cynthia Rosales, female , DOB: December 07, 1980 , age 33 y.o. , MRN: [id number] , ADDRESS: [address] [place] [address] [place] [address] PCP Rometta Emery, MD Patient Care Team: Rometta Emery, MD as PCP - General (Internal Medicine) [place], Dennard Schaumann, RN as [facility] Care Management  This Provider for this visit: Treatment Team:  Attending Provider: Kalman Shan, MD    09/02/2023 -   Chief Complaint  Patient presents with   Follow-up    Breathing is doing well. She would like a flu vaccine.      HPI Thia Olesen Buel 42 y.o. 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  Started on prednisone 08/18/2023.  Suffers  OV 05/21/2023-this not a hospital follow-up but had a new consult referred by the ER.  Subjective:  Patient ID: Cynthia Rosales, female , DOB: 02-26-1981 , age 42 y.o. , MRN: [id number] , ADDRESS: [address] [place] [address] PCP Rometta Emery, MD Patient Care Team: Rometta Emery, MD as PCP - General (Internal Medicine) [place], Dennard Schaumann, RN as [facility] Care Management  This Provider for this visit: Treatment Team:  Attending Provider: Kalman Shan, MD    05/21/2023 -   Chief Complaint  Patient presents with   Hospitalization Follow-up    Hosp. f/up fluid in lungs.     HPI Cynthia Rosales 42 y.o. -new consultation referred by ER physician Dr. Lorre Nick.  Patient's history is provided by review of the medical records in the emergency department 05/19/2023.  Patient reports that for the last few weeks she has been having some left sided back pain that appears to be in the area of the left iliac crest based on her pointing to me.  She went to the emergency department.  They did a CT spine and abdomen.  This was all negative except CT chest showed mediastinal adenopathy.  It was a CT chest did not look for PE.  There is no D-dimer.  She does report 2 to 3 weeks history of shortness of breath with exertion relieved by rest.  She says she reported this to the emergency department but I am not sure.  Remotely she has a D-dimer that was elevated.  She says at baseline she has hot flashes since hysterectomy 2 years ago.  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  She does not know if she has a past history of sarcoid.  In fact she says she never heard about this diagnosis.  She recalls the ER doctor telling her that she has fluid in the lungs but in review of the records and personal visualization of the CT chest she has mediastinal adenopathy and the lung  parenchyma itself is clear there is some nodules reported in the lower lobe but I am not sure.  Last BNP check 2019.  No prior echo. Mild anemia 05/19/2023.  Sickle cell ruled out per chart review. Chemistry normal. CT Chest W Contrast  Result Date: 05/19/2023 CLINICAL DATA:  Assess for mediastinal adenopathy EXAM: CT CHEST WITH CONTRAST TECHNIQUE: Multidetector CT imaging of the chest was performed during intravenous contrast administration. RADIATION DOSE REDUCTION: This exam was performed according to the departmental dose-optimization program which includes automated exposure control, adjustment of the mA and/or kV according to patient size and/or use of iterative reconstruction technique. CONTRAST:  OMNIPAQUE IOHEXOL 300 MG/ML  SOLN COMPARISON:  Same day CT of the abdomen and pelvis FINDINGS: Cardiovascular: Normal heart size. No pericardial effusion. Normal caliber thoracic aorta with no atherosclerotic disease. Mediastinum/Nodes: Esophagus thyroid are unremarkable. Enlarged mediastinal bilateral hilar lymph nodes. Reference right paratracheal lymph node measuring 1.9 cm on series 2 image 16 reference right hilar lymph node measuring 1.7 cm on image 29. Lungs/Pleura: Central airways are patent. Consolidation, pleural effusion or pneumothorax. Numerous scattered small solid pulmonary nodules. Largest is a 6 mm nodule of the right middle lobe located on series 4, image 71. Focal nodular pleural thickening of the left lower lobe seen on series 4 image 67-80. No pleural effusion or pneumothorax. Upper Abdomen: See  OV 05/21/2023-this not a hospital follow-up but had a new consult referred by the ER.  Subjective:  Patient ID: Cynthia Rosales, female , DOB: 02-26-1981 , age 42 y.o. , MRN: [id number] , ADDRESS: [address] [place] [address] PCP Rometta Emery, MD Patient Care Team: Rometta Emery, MD as PCP - General (Internal Medicine) [place], Dennard Schaumann, RN as [facility] Care Management  This Provider for this visit: Treatment Team:  Attending Provider: Kalman Shan, MD    05/21/2023 -   Chief Complaint  Patient presents with   Hospitalization Follow-up    Hosp. f/up fluid in lungs.     HPI Cynthia Rosales 42 y.o. -new consultation referred by ER physician Dr. Lorre Nick.  Patient's history is provided by review of the medical records in the emergency department 05/19/2023.  Patient reports that for the last few weeks she has been having some left sided back pain that appears to be in the area of the left iliac crest based on her pointing to me.  She went to the emergency department.  They did a CT spine and abdomen.  This was all negative except CT chest showed mediastinal adenopathy.  It was a CT chest did not look for PE.  There is no D-dimer.  She does report 2 to 3 weeks history of shortness of breath with exertion relieved by rest.  She says she reported this to the emergency department but I am not sure.  Remotely she has a D-dimer that was elevated.  She says at baseline she has hot flashes since hysterectomy 2 years ago.  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  She does not know if she has a past history of sarcoid.  In fact she says she never heard about this diagnosis.  She recalls the ER doctor telling her that she has fluid in the lungs but in review of the records and personal visualization of the CT chest she has mediastinal adenopathy and the lung  parenchyma itself is clear there is some nodules reported in the lower lobe but I am not sure.  Last BNP check 2019.  No prior echo. Mild anemia 05/19/2023.  Sickle cell ruled out per chart review. Chemistry normal. CT Chest W Contrast  Result Date: 05/19/2023 CLINICAL DATA:  Assess for mediastinal adenopathy EXAM: CT CHEST WITH CONTRAST TECHNIQUE: Multidetector CT imaging of the chest was performed during intravenous contrast administration. RADIATION DOSE REDUCTION: This exam was performed according to the departmental dose-optimization program which includes automated exposure control, adjustment of the mA and/or kV according to patient size and/or use of iterative reconstruction technique. CONTRAST:  OMNIPAQUE IOHEXOL 300 MG/ML  SOLN COMPARISON:  Same day CT of the abdomen and pelvis FINDINGS: Cardiovascular: Normal heart size. No pericardial effusion. Normal caliber thoracic aorta with no atherosclerotic disease. Mediastinum/Nodes: Esophagus thyroid are unremarkable. Enlarged mediastinal bilateral hilar lymph nodes. Reference right paratracheal lymph node measuring 1.9 cm on series 2 image 16 reference right hilar lymph node measuring 1.7 cm on image 29. Lungs/Pleura: Central airways are patent. Consolidation, pleural effusion or pneumothorax. Numerous scattered small solid pulmonary nodules. Largest is a 6 mm nodule of the right middle lobe located on series 4, image 71. Focal nodular pleural thickening of the left lower lobe seen on series 4 image 67-80. No pleural effusion or pneumothorax. Upper Abdomen: See  to Dr. Tonia Brooms for biopsy to confirm possible underlying  sarcoidosis.   Synopsis 42 year old female, past medical history of asthma, type 2 diabetes. Patient is a never smoker. Patient is seen by Dr. Marchelle Gearing in clinic. Patient was referred for abnormal CT imaging. Patient was found to have enlarged hypermetabolic mediastinal and bilateral hilar adenopathy concerning for possible underlying sarcoidosis. Was referred August 2024 for consideration of bronchoscopy and biopsy. She underwent  VIDEO BRONCHOSCOPY WITH ENDOBRONCHIAL ULTRASOUND (Bilateral) BRONCHIAL NEEDLE ASPIRATION BIOPSIES on 08/10/2023. She is here today for post procedure follow up.    08/18/2023 Pt. Presents for follow up after video bronchoscopy with endobronchial ultrasound bronchial needle aspiration and biopsies on 08/10/2023.  Patient states she did well postprocedure.  She denies any hemoptysis, fever, discolored secretions, difficulty breathing, or anesthesia adverse reaction. We have reviewed the cytology of her biopsies.They confirm sarcoidosis. She has been  symptomatic. She has pain in her chest. She also has some shortness of breath. She is coughing up a lot of mucus. She states it ranges from green to yellow. She states she has been having this for about 2 months. Plan will be to refer back to Dr. Marchelle Gearing for management.As she is symptomatic, I will start prednisone until she can be seen by Dr. Marchelle Gearing. She is diabetic. I have explained to her that her blood sugars will increase on the prednisone.  I have asked her to reach out to the practitioner who manages her blood sugars so that she can let them know she is on prednisone for her sarcoidosis.  And help her manage blood sugars.  She understands she will need to check her blood sugars more frequently while on the prednisone. We discussed that she will need to have eye exams annually and that she will be referred to cardiology for an evaluation to ensure there is no cardiac sarcoid.  I explained it is not a bad idea to have annual  follow-up with cardiology to ensure that there is no cardiac involvement. I have also provided the patient with some patient education regarding what sarcoidosis is. There are no appointments with Dr. Marchelle Gearing until November 2024, as I am concerned about how patient will do on the prednisone I will see her again in 2 weeks to ensure that she is doing well and to see if she has experienced some symptom relief with treatment.  Patient is very engaged and wants to manage her sarcoidosis well.  Test Results: 08/10/2023 Cytology  FINAL MICROSCOPIC DIAGNOSIS:  A. LYMPH NODE, STATION 7, FINE NEEDLE ASPIRATION:  - Granulomatous inflammation  - Lymphoid tissue present   B. LYMPH NODE, 4R, FINE NEEDLE ASPIRATION:  - No malignant cells identified  - Benign bronchial cells present  - No lymphoid tissue present    OV 09/02/2023  Subjective:  Patient ID: Cynthia Rosales, female , DOB: December 07, 1980 , age 33 y.o. , MRN: [id number] , ADDRESS: [address] [place] [address] [place] [address] PCP Rometta Emery, MD Patient Care Team: Rometta Emery, MD as PCP - General (Internal Medicine) [place], Dennard Schaumann, RN as [facility] Care Management  This Provider for this visit: Treatment Team:  Attending Provider: Kalman Shan, MD    09/02/2023 -   Chief Complaint  Patient presents with   Follow-up    Breathing is doing well. She would like a flu vaccine.      HPI Thia Olesen Buel 42 y.o. 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  Started on prednisone 08/18/2023.  Suffers  to Dr. Tonia Brooms for biopsy to confirm possible underlying  sarcoidosis.   Synopsis 42 year old female, past medical history of asthma, type 2 diabetes. Patient is a never smoker. Patient is seen by Dr. Marchelle Gearing in clinic. Patient was referred for abnormal CT imaging. Patient was found to have enlarged hypermetabolic mediastinal and bilateral hilar adenopathy concerning for possible underlying sarcoidosis. Was referred August 2024 for consideration of bronchoscopy and biopsy. She underwent  VIDEO BRONCHOSCOPY WITH ENDOBRONCHIAL ULTRASOUND (Bilateral) BRONCHIAL NEEDLE ASPIRATION BIOPSIES on 08/10/2023. She is here today for post procedure follow up.    08/18/2023 Pt. Presents for follow up after video bronchoscopy with endobronchial ultrasound bronchial needle aspiration and biopsies on 08/10/2023.  Patient states she did well postprocedure.  She denies any hemoptysis, fever, discolored secretions, difficulty breathing, or anesthesia adverse reaction. We have reviewed the cytology of her biopsies.They confirm sarcoidosis. She has been  symptomatic. She has pain in her chest. She also has some shortness of breath. She is coughing up a lot of mucus. She states it ranges from green to yellow. She states she has been having this for about 2 months. Plan will be to refer back to Dr. Marchelle Gearing for management.As she is symptomatic, I will start prednisone until she can be seen by Dr. Marchelle Gearing. She is diabetic. I have explained to her that her blood sugars will increase on the prednisone.  I have asked her to reach out to the practitioner who manages her blood sugars so that she can let them know she is on prednisone for her sarcoidosis.  And help her manage blood sugars.  She understands she will need to check her blood sugars more frequently while on the prednisone. We discussed that she will need to have eye exams annually and that she will be referred to cardiology for an evaluation to ensure there is no cardiac sarcoid.  I explained it is not a bad idea to have annual  follow-up with cardiology to ensure that there is no cardiac involvement. I have also provided the patient with some patient education regarding what sarcoidosis is. There are no appointments with Dr. Marchelle Gearing until November 2024, as I am concerned about how patient will do on the prednisone I will see her again in 2 weeks to ensure that she is doing well and to see if she has experienced some symptom relief with treatment.  Patient is very engaged and wants to manage her sarcoidosis well.  Test Results: 08/10/2023 Cytology  FINAL MICROSCOPIC DIAGNOSIS:  A. LYMPH NODE, STATION 7, FINE NEEDLE ASPIRATION:  - Granulomatous inflammation  - Lymphoid tissue present   B. LYMPH NODE, 4R, FINE NEEDLE ASPIRATION:  - No malignant cells identified  - Benign bronchial cells present  - No lymphoid tissue present    OV 09/02/2023  Subjective:  Patient ID: Cynthia Rosales, female , DOB: December 07, 1980 , age 33 y.o. , MRN: [id number] , ADDRESS: [address] [address] [place] [address] PCP Rometta Emery, MD Patient Care Team: Rometta Emery, MD as PCP - General (Internal Medicine) [place], Dennard Schaumann, RN as [facility] Care Management  This Provider for this visit: Treatment Team:  Attending Provider: Kalman Shan, MD    09/02/2023 -   Chief Complaint  Patient presents with   Follow-up    Breathing is doing well. She would like a flu vaccine.      HPI Thia Olesen Buel 42 y.o. 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  Started on prednisone 08/18/2023.  Suffers

## 2023-09-02 NOTE — Patient Instructions (Addendum)
ICD-10-CM   1. 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 Chronic cough  R05.3     3. Current chronic use of systemic steroids  Z79.52     4. Flu vaccine need  Z23      Glad your cough is significantly better after starting prednisone but prednisone at 30 mg/day since mid September 2024 is contributing to increased blood sugar  Plan - Drop to 20 mg prednisone per day for 2 weeks and then go down to 10 mg prednisone per day and stay at that dose - If your cough is returning let us know - For uncontrolled sarcoid the neck step would be methotrexate and we discussed this drug extensively -Flu shot today  Follow-up - 4 to 6 weeks video visit or face-to-face visit with nurse practitioner Jairo Ben or Dr. Marchelle Gearing 15-minute visit

## 2023-09-03 ENCOUNTER — Ambulatory Visit: Payer: 59 | Admitting: Acute Care

## 2023-09-03 ENCOUNTER — Other Ambulatory Visit (HOSPITAL_BASED_OUTPATIENT_CLINIC_OR_DEPARTMENT_OTHER): Payer: Self-pay

## 2023-09-03 MED ORDER — MOUNJARO 5 MG/0.5ML ~~LOC~~ SOAJ
5.0000 mg | SUBCUTANEOUS | 1 refills | Status: AC
  Filled 2023-09-03: qty 2, 28d supply, fill #0

## 2023-09-03 MED ORDER — MOUNJARO 5 MG/0.5ML ~~LOC~~ SOAJ: 5.0000 mg | SUBCUTANEOUS | 1 refills | Status: DC

## 2023-09-06 ENCOUNTER — Other Ambulatory Visit: Payer: Self-pay | Admitting: Acute Care

## 2023-09-06 ENCOUNTER — Telehealth: Payer: Self-pay | Admitting: Internal Medicine

## 2023-09-06 DIAGNOSIS — J069 Acute upper respiratory infection, unspecified: Secondary | ICD-10-CM

## 2023-09-06 NOTE — Telephone Encounter (Signed)
Patient states Prednisone was not at the pharmacy. Pharmacy is [facility] [address]. Patient phone number is [phone].

## 2023-09-07 MED ORDER — PREDNISONE 10 MG PO TABS: ORAL_TABLET | ORAL | 0 refills | Status: DC

## 2023-09-07 NOTE — Telephone Encounter (Signed)
[facility]one sent to [facility]. Left detailed message for patient.  Nothing further needed.

## 2023-09-08 ENCOUNTER — Other Ambulatory Visit: Payer: Self-pay | Admitting: Acute Care

## 2023-09-08 DIAGNOSIS — J069 Acute upper respiratory infection, unspecified: Secondary | ICD-10-CM

## 2023-10-01 ENCOUNTER — Telehealth: Payer: Self-pay | Admitting: Internal Medicine

## 2023-10-01 ENCOUNTER — Ambulatory Visit: Payer: 59 | Admitting: Acute Care

## 2023-10-01 NOTE — Telephone Encounter (Signed)
Patient cancelled appt with SG today 10/01/23. Called and left VM for pt. MR has openings on 11/13.

## 2023-10-01 NOTE — Telephone Encounter (Signed)
predniSONE (DELTASONE) 10 MG tablet  [id number] - [place], [place] - [address]

## 2023-10-04 MED ORDER — PREDNISONE 10 MG PO TABS: 10.0000 mg | ORAL_TABLET | Freq: Every day | ORAL | 0 refills | Status: DC

## 2023-10-04 NOTE — Telephone Encounter (Signed)
Patient scheduled with Dr. Marchelle Gearing on 11/13. Nothing further needed.

## 2023-10-04 NOTE — Telephone Encounter (Signed)
Plan - Drop to 20 mg prednisone per day for 2 weeks and then go down to 10 mg prednisone per day and stay at that dose    Prednisone 10mg  sent to preferred pharmacy. Patient is aware and voiced her understanding.  Nothing further needed.

## 2023-10-05 ENCOUNTER — Other Ambulatory Visit: Payer: Self-pay | Admitting: Internal Medicine

## 2023-10-13 ENCOUNTER — Ambulatory Visit (INDEPENDENT_AMBULATORY_CARE_PROVIDER_SITE_OTHER): Payer: 59 | Admitting: Internal Medicine

## 2023-10-13 ENCOUNTER — Encounter: Payer: Self-pay | Admitting: Internal Medicine

## 2023-10-13 VITALS — BP 126/74 | HR 93 | Temp 98.3°F | Ht 63.0 in | Wt 324.0 lb

## 2023-10-13 DIAGNOSIS — D860 Sarcoidosis of lung: Secondary | ICD-10-CM | POA: Diagnosis not present

## 2023-10-13 DIAGNOSIS — Z5181 Encounter for therapeutic drug level monitoring: Secondary | ICD-10-CM | POA: Diagnosis not present

## 2023-10-13 DIAGNOSIS — Z7952 Long term (current) use of systemic steroids: Secondary | ICD-10-CM | POA: Diagnosis not present

## 2023-10-13 DIAGNOSIS — R109 Unspecified abdominal pain: Secondary | ICD-10-CM

## 2023-10-13 DIAGNOSIS — R110 Nausea: Secondary | ICD-10-CM

## 2023-10-13 DIAGNOSIS — M791 Myalgia, unspecified site: Secondary | ICD-10-CM

## 2023-10-13 DIAGNOSIS — R053 Chronic cough: Secondary | ICD-10-CM | POA: Diagnosis not present

## 2023-10-13 NOTE — Patient Instructions (Addendum)
Pulmonary sarcoidosis (HCC) -diagnosis made September 2024 based on mediastinal adenopathy Chronic cough Current chronic use of systemic steroids Encoubter therapeutic monitoring  - Sarcoid symptoms seem to be doing well on 10 mg prednisone daily  Plan - Continue 10 mg prednisone daily -Hold off on starting methotrexate for the moment -Check blood labs for therapeutic monitoring with chronic prednisone [see below] -Chest x-ray in 12 weeks  Myalgia Nausea Abdominal cramps  -Unclear if this is a side effect of high blood sugars or any early onset of viral infection or food poisoning  Plan - Check CBC with differential, chemistry, liver function test, and hemoglobin A1c.   Follow-up - 12 weeks r face-to-face visit with nurse practitioner Jairo Ben or Dr. Marchelle Gearing 15-minute visit  -

## 2023-10-13 NOTE — Addendum Note (Signed)
Addended by: Christen Butter on: 10/13/2023 04:11 PM   Modules accepted: Orders

## 2023-10-13 NOTE — Progress Notes (Signed)
OV 05/21/2023-this not a hospital follow-up but had a new consult referred by the ER.  Subjective:  Patient ID: Cynthia Rosales, female , DOB: [birth year] , age 42 y.o. , MRN: [id number] , ADDRESS: [address] [place] [address] PCP Rometta Emery, MD Patient Care Team: Rometta Emery, MD as PCP - General (Internal Medicine) Pleasant, Dennard Schaumann, RN as [facility] Care Management  This Provider for this visit: Treatment Team:  Attending Provider: Kalman Shan, MD    05/21/2023 -   Chief Complaint  Patient presents with   Hospitalization Follow-up    Hosp. f/up fluid in lungs.     HPI Cynthia Rosales 42 y.o. -new consultation referred by ER physician Dr. Lorre Nick.  Patient's history is provided by review of the medical records in the emergency department 05/19/2023.  Patient reports that for the last few weeks she has been having some left sided back pain that appears to be in the area of the left iliac crest based on her pointing to me.  She went to the emergency department.  They did a CT spine and abdomen.  This was all negative except CT chest showed mediastinal adenopathy.  It was a CT chest did not look for PE.  There is no D-dimer.  She does report 2 to 3 weeks history of shortness of breath with exertion relieved by rest.  She says she reported this to the emergency department but I am not sure.  Remotely she has a D-dimer that was elevated.  She says at baseline she has hot flashes since hysterectomy 2 years ago.  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  She does not know if she has a past history of sarcoid.  In fact she says she never heard about this diagnosis.  She recalls the ER doctor telling her that she has fluid in the lungs but in review of the records and personal visualization of the CT chest she has mediastinal adenopathy and the lung  parenchyma itself is clear there is some nodules reported in the lower lobe but I am not sure.  Last BNP check 2019.  No prior echo. Mild anemia 05/19/2023.  Sickle cell ruled out per chart review. Chemistry normal. CT Chest W Contrast  Result Date: 05/19/2023 CLINICAL DATA:  Assess for mediastinal adenopathy EXAM: CT CHEST WITH CONTRAST TECHNIQUE: Multidetector CT imaging of the chest was performed during intravenous contrast administration. RADIATION DOSE REDUCTION: This exam was performed according to the departmental dose-optimization program which includes automated exposure control, adjustment of the mA and/or kV according to patient size and/or use of iterative reconstruction technique. CONTRAST:  OMNIPAQUE IOHEXOL 300 MG/ML  SOLN COMPARISON:  Same day CT of the abdomen and pelvis FINDINGS: Cardiovascular: Normal heart size. No pericardial effusion. Normal caliber thoracic aorta with no atherosclerotic disease. Mediastinum/Nodes: Esophagus thyroid are unremarkable. Enlarged mediastinal bilateral hilar lymph nodes. Reference right paratracheal lymph node measuring 1.9 cm on series 2 image 16 reference right hilar lymph node measuring 1.7 cm on image 29. Lungs/Pleura: Central airways are patent. Consolidation, pleural effusion or pneumothorax. Numerous scattered small solid pulmonary nodules. Largest is a 6 mm nodule of the right middle lobe located on series 4, image 71. Focal nodular pleural thickening of the left lower lobe seen on series 4 image 67-80. No pleural effusion or pneumothorax. Upper Abdomen: See  same day separately dictated CT of the abdomen and pelvis for discussion of findings below the diaphragm. Musculoskeletal: No chest wall abnormality. No acute or significant osseous findings. IMPRESSION: 1. Enlarged mediastinal and bilateral hilar lymph nodes, differential considerations include sarcoidosis, reactive adenopathy, or lymphoproliferative disease. Recommend clinical correlation  and short-term follow-up chest CT in 3 months. 2. Numerous scattered small solid pulmonary and focal nodular pleural thickening of the left lower lobe, findings are nonspecific but can be seen in the setting of sarcoidosis. Recommend attention on follow-up. Electronically Signed   By: Allegra Lai M.D.   On: 05/19/2023 09:25   CT Cervical Spine Wo Contrast  Result Date: 05/19/2023 CLINICAL DATA:  Neck pain. Prior cervical spine surgery. Prior C4 ependymoma resection. EXAM: CT CERVICAL SPINE WITHOUT CONTRAST TECHNIQUE: Multidetector CT imaging of the cervical spine was performed without intravenous contrast. Multiplanar CT image reconstructions were also generated. RADIATION DOSE REDUCTION: This exam was performed according to the departmental dose-optimization program which includes automated exposure control, adjustment of the mA and/or kV according to patient size and/or use of iterative reconstruction technique. COMPARISON:  09/25/2015 and prior cervical MRI from 09/30/2019 FINDINGS: Body habitus reduces diagnostic sensitivity and specificity. Alignment: Mild reversal the normal cervical lordosis. Skull base and vertebrae: Prior posterior decompression from C3 through the C5-6 level. Mild spurring and loss of articular space at the anterior C1-2 articulation. No fracture or acute bony findings. Soft tissues and spinal canal: Unremarkable Disc levels: Generally maintained intervertebral disc height with no findings of bony impingement in the cervical spine. Upper chest: Suspected mildly enlarged upper mediastinal adenopathy. Other: No supplemental non-categorized findings. IMPRESSION: 1. Prior posterior decompression from C3 through C5-6 associated with previous C4 pending bone more resection. 2. Mild reversal the normal cervical lordosis. 3. Suspected mildly enlarged upper mediastinal adenopathy. Chest CT is recommended for definitive assessment. Electronically Signed   By: Gaylyn Rong M.D.   On:  05/19/2023 08:09   CT ABDOMEN PELVIS W CONTRAST  Result Date: 05/19/2023 CLINICAL DATA:  Left upper quadrant abdominal pain and left flank pain over the last 4 days EXAM: CT ABDOMEN AND PELVIS WITH CONTRAST TECHNIQUE: Multidetector CT imaging of the abdomen and pelvis was performed using the standard protocol following bolus administration of intravenous contrast. RADIATION DOSE REDUCTION: This exam was performed according to the departmental dose-optimization program which includes automated exposure control, adjustment of the mA and/or kV according to patient size and/or use of iterative reconstruction technique. CONTRAST:  OMNIPAQUE IOHEXOL 300 MG/ML  SOLN COMPARISON:  07/10/2021 FINDINGS: Body habitus reduces diagnostic sensitivity and specificity. Lower chest: Unremarkable Hepatobiliary: Cholecystectomy. Common bile duct proximally 0.7 cm in diameter, likely a physiologic response to cholecystectomy. No focal hepatic parenchymal lesion identified. Pancreas: Unremarkable Spleen: Unremarkable Adrenals/Urinary Tract: Unremarkable Stomach/Bowel: Postoperative findings along left paracentral small bowel in the upper pelvis compatible with prior bowel resection. Prior appendectomy. Vascular/Lymphatic: Unremarkable Reproductive: Uterus absent.  Adnexa unremarkable. Other: No supplemental non-categorized findings. Musculoskeletal: Degenerative facet arthropathy on the right at L4-5. IMPRESSION: 1. No specific abnormality is identified to explain the patient's left upper quadrant and left flank pain. 2. Prior small bowel resection. 3. Degenerative facet arthropathy on the right at L4-5. Electronically Signed   By: Gaylyn Rong M.D.   On: 05/19/2023 08:00    OV with APP 08/18/23  Cynthia Rosales is a 42 y.o. female former  smoker with past medical history of asthma, type 2 diabetes.She is followed by Dr. Marchelle Gearing in the clinic and was referred  to Dr. Tonia Brooms for biopsy to confirm possible underlying  sarcoidosis.   Synopsis 42 year old female, past medical history of asthma, type 2 diabetes. Patient is a never smoker. Patient is seen by Dr. Marchelle Gearing in clinic. Patient was referred for abnormal CT imaging. Patient was found to have enlarged hypermetabolic mediastinal and bilateral hilar adenopathy concerning for possible underlying sarcoidosis. Was referred August 2024 for consideration of bronchoscopy and biopsy. She underwent  VIDEO BRONCHOSCOPY WITH ENDOBRONCHIAL ULTRASOUND (Bilateral) BRONCHIAL NEEDLE ASPIRATION BIOPSIES on 08/10/2023. She is here today for post procedure follow up.    08/18/2023 Pt. Presents for follow up after video bronchoscopy with endobronchial ultrasound bronchial needle aspiration and biopsies on 08/10/2023.  Patient states she did well postprocedure.  She denies any hemoptysis, fever, discolored secretions, difficulty breathing, or anesthesia adverse reaction. We have reviewed the cytology of her biopsies.They confirm sarcoidosis. She has been  symptomatic. She has pain in her chest. She also has some shortness of breath. She is coughing up a lot of mucus. She states it ranges from green to yellow. She states she has been having this for about 2 months. Plan will be to refer back to Dr. Marchelle Gearing for management.As she is symptomatic, I will start prednisone until she can be seen by Dr. Marchelle Gearing. She is diabetic. I have explained to her that her blood sugars will increase on the prednisone.  I have asked her to reach out to the practitioner who manages her blood sugars so that she can let them know she is on prednisone for her sarcoidosis.  And help her manage blood sugars.  She understands she will need to check her blood sugars more frequently while on the prednisone. We discussed that she will need to have eye exams annually and that she will be referred to cardiology for an evaluation to ensure there is no cardiac sarcoid.  I explained it is not a bad idea to have annual  follow-up with cardiology to ensure that there is no cardiac involvement. I have also provided the patient with some patient education regarding what sarcoidosis is. There are no appointments with Dr. Marchelle Gearing until November 2024, as I am concerned about how patient will do on the prednisone I will see her again in 2 weeks to ensure that she is doing well and to see if she has experienced some symptom relief with treatment.  Patient is very engaged and wants to manage her sarcoidosis well.  Test Results: 08/10/2023 Cytology  FINAL MICROSCOPIC DIAGNOSIS:  A. LYMPH NODE, STATION 7, FINE NEEDLE ASPIRATION:  - Granulomatous inflammation  - Lymphoid tissue present   B. LYMPH NODE, 4R, FINE NEEDLE ASPIRATION:  - No malignant cells identified  - Benign bronchial cells present  - No lymphoid tissue present    OV 09/02/2023  Subjective:  Patient ID: Cynthia Rosales, female , DOB: [birth year] , age 8 y.o. , MRN: [id number] , ADDRESS: [address] [place] [address] [place] [address] PCP Rometta Emery, MD Patient Care Team: Rometta Emery, MD as PCP - General (Internal Medicine) Pleasant, Dennard Schaumann, RN as [facility] Care Management  This Provider for this visit: Treatment Team:  Attending Provider: Kalman Shan, MD    09/02/2023 -   Chief Complaint  Patient presents with   Follow-up    Breathing is doing well. She would like a flu vaccine.      HPI Cynthia Rosales 42 y.o. 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  Started on prednisone 08/18/2023.  Suffers  from morbid obesity and also diabetes.  Returns for follow-up starting prednisone 08/18/2023 by Jairo Ben nurse practitioner.  She is now on 30 mg/day.  Steroids are causing hyperglycemia.  Primary care has initiated insulin adjustments.  Sugars are better.  Cough is not resolved and she is feeling better.  She will have a flu shot today.  We discussed methotrexate therapy is added on in case of  progression or refractory symptoms.  Discussed the side effects of prednisone.    OV 10/13/2023  Subjective:  Patient ID: Cynthia Rosales, female , DOB: 30-Jan-1981 , age 1 y.o. , MRN: [id number] , ADDRESS: [address] [place] [address] [place] [address] PCP Rometta Emery, MD Patient Care Team: Rometta Emery, MD as PCP - General (Internal Medicine) Pleasant, Dennard Schaumann, RN as [facility] Care Management  This Provider for this visit: Treatment Team:  Attending Provider: Kalman Shan, MD    10/13/2023 -   Chief Complaint  Patient presents with   Follow-up    She has had night sweats and aches since she woke up this morning.      HPI Cynthia Rosales 42 y.o. 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  Started on prednisone 08/18/2023.  Suffers from morbid obesity and also diabetes.  She is here for follow-up just 1 month after the last visit.  The purpose of this visit is to see how she is doing with the prednisone 10 mg/day.  She was on 20 mg/day and this was causing significant problems controlling her blood sugar.  But it did help her sarcoidosis in terms of cough.  Therefore we wanted to see what she would be doing at 10 mg/day.  She feels her sugar is well-controlled.  This is based on subjective history.  The 10 mg/day is certainly controlling her sarcoid well because she does not have any cough.  She is feeling well overall except yesterday she overnight picked up some night sweats and chills.  She makes him some bodyaches as well and abdominal cramps.  This morning she got up and vomited.  There is no headache no fever no chills no rash no diarrhea.  She ate some tuna fish at home she also ate some broccoli and cheese but is all home-cooked food.  She says the food tasted fine.  Her son is Ms. school today because of asthma attack.  She denies any other upper respiratory viral symptoms.  Did indicate to her I do not know exactly what is going on  but will be open to getting some blood work done.  She is open to this idea.  She is up-to-date with the flu shot.   PFT      No data to display            LAB RESULTS last 96 hours No results found.  LAB RESULTS last 90 days Recent Results (from the past 2160 hour(s))  Glucose, capillary     Status: Abnormal   Collection Time: 08/10/23  8:46 AM  Result Value Ref Range   Glucose-Capillary 189 (H) 70 - 99 mg/dL    Comment: Glucose reference range applies only to samples taken after fasting for at least 8 hours.  Basic metabolic panel per protocol     Status: Abnormal   Collection Time: 08/10/23  8:52 AM  Result Value Ref Range   Sodium 136 135 - 145 mmol/L   Potassium 3.5 3.5 - 5.1 mmol/L   Chloride 100 98 - 111  mmol/L   CO2 24 22 - 32 mmol/L   Glucose, Bld 199 (H) 70 - 99 mg/dL    Comment: Glucose reference range applies only to samples taken after fasting for at least 8 hours.   BUN 12 6 - 20 mg/dL   Creatinine, Ser 9.56 0.44 - 1.00 mg/dL   Calcium 8.6 (L) 8.9 - 10.3 mg/dL   GFR, Estimated >21 >30 mL/min    Comment: (NOTE) Calculated using the CKD-EPI Creatinine Equation (2021)    Anion gap 12 5 - 15    Comment: Performed at [facility], [address] [place], [place] [address]  CBC per protocol     Status: Abnormal   Collection Time: 08/10/23  8:52 AM  Result Value Ref Range   WBC 9.5 4.0 - 10.5 K/uL   RBC 5.32 (H) 3.87 - 5.11 MIL/uL   Hemoglobin 12.1 12.0 - 15.0 g/dL   HCT 46.9 62.9 - 52.8 %   MCV 73.9 (L) 80.0 - 100.0 fL   MCH 22.7 (L) 26.0 - 34.0 pg   MCHC 30.8 30.0 - 36.0 g/dL   RDW 41.3 (H) 24.4 - 01.0 %   Platelets 330 150 - 400 K/uL   nRBC 0.0 0.0 - 0.2 %    Comment: Performed at [facility], [address] [place], [place] [address]  Cytology - Non PAP;     Status: None   Collection Time: 08/10/23 11:54 AM  Result Value Ref Range   CYTOLOGY - NON GYN      CYTOLOGY - NON PAP CASE: [id number] PATIENT: Dossie Arbour Non-Gynecological Cytology Report     Clinical History: None provided    FINAL MICROSCOPIC DIAGNOSIS: A. LYMPH NODE, STATION 7, FINE NEEDLE ASPIRATION: - Granulomatous inflammation - Lymphoid tissue present  B. LYMPH NODE, 4R, FINE NEEDLE ASPIRATION: - No malignant cells identified - Benign bronchial cells present - No lymphoid tissue present  SPECIMEN ADEQUACY: A. Satisfactory for Evaluation B. Satisfactory for Evaluation  GROSS: Received is/are (A)(1)2 Slides (1quick stain). (2)2 Slides (1quick stain). Also received 12ccs of cloudy red saline solution from needle rinses. (B)(1)2 Slides (1quick stain). Also received 9ccs of cloudy pink saline solution from needle rinses.(MA:TB:tb) Smears: (A)4 (B2 Concentration Method (Thin Prep)(A)1 (B)1 Cell Block: (A)1Conventional (B)1Conventional Additional Studies: NA    Final Diagnosis performed by Lance Coon, MD.   Electronically signed 08/11/2023  Technical and / or Professional components performed at [facility] [facility], [address] [place], [place] [address].  Immunohistochemistry Technical component (if applicable) was performed at [facility]. [address], [place], [place] [address].   IMMUNOHISTOCHEMISTRY DISCLAIMER (if applicable): Some of these immunohistochemical stains may have been developed and the performance characteristics determine by [facility]. Some may not have been cleared or approved by the U.S. Food and Drug Administration. The FDA has determined that such clearance or approval is not necessary. This test is used for clinical purposes. It should not be regarded as investigational or for research. This laboratory is certified under the Clinical Laboratory Improvement Amendments of 1988 (CLIA-88) as qualified to perform high complexity clinical laboratory testing.  The controls stained appropriately.   IHC stains are performed on forma lin  fixed, paraffin embedded tissue using a 3,3"diaminobenzidine (DAB) chromogen and Leica Bond Autostainer System. The staining intensity of the nucleus is score manually and is reported as the percentage of tumor cell nuclei demonstrating specific nuclear staining. The specimens are fixed in  10% Neutral Formalin for at least 6 hours and up to 72hrs. These tests are validated on decalcified tissue. Results should be interpreted with caution given the possibility of false negative results on decalcified specimens. Antibody Clones are as follows ER-clone [id number], PR-clone 16, Ki67- clone MM1. Some of these immunohistochemical stains may have been developed and the performance characteristics determined by [facility].          has a past medical history of Abscess of bursa, left elbow (06/2013), Arthritis, Asthma, Depression, History of blood transfusion, History of kidney stones, Hypertension, Ovarian cyst, Paralysis (HCC), Schizophrenia (HCC), Sickle cell disease ruled out, and Type II diabetes mellitus (HCC).   reports that she has quit smoking. Her smoking use included cigarettes. She has never used smokeless tobacco.  Past Surgical History:  Procedure Laterality Date   APPENDECTOMY  2013   BILIARY STENT PLACEMENT N/A 05/02/2018   Procedure: BILIARY STENT PLACEMENT;  Surgeon: Sherrilyn Rist, MD;  Location: [facility] END[facility]COPY;  Service: Gastroenterology;  Laterality: N/A;   BOWEL RESECTION  12/03/2020   Procedure: SMALL BOWEL RESECTION;  Surgeon: Sheliah Hatch, De Blanch, MD;  Location: [facility] OR;  Service: General;;   BRONCHIAL NEEDLE ASPIRATION BIOPSY  08/10/2023   Procedure: BRONCHIAL NEEDLE ASPIRATION BIOPSIES;  Surgeon: Josephine Igo, DO;  Location: [facility] ENDOSCOPY;  Service: Cardiopulmonary;;   CARPAL TUNNEL RELEASE     CESAREAN SECTION  2000; 2007; 2011   CHOLECYSTECTOMY N/A 04/03/2018   Procedure: LAPAROSCOPIC CHOLECYSTECTOMY;  Surgeon: Emelia Loron, MD;  Location: [facility] OR;  Service:  General;  Laterality: N/A;   DILATATION & CURETTAGE/HYSTEROSCOPY WITH MYOSURE N/A 10/22/2020   Procedure: DILATATION & CURETTAGE/HYSTEROSCOPY WITH MYOSURE;  Surgeon: Lavina Hamman, MD;  Location: [facility] OR;  Service: Gynecology;  Laterality: N/A;   DILATION AND CURETTAGE OF UTERUS     ERCP N/A 05/02/2018   Procedure: ENDOSCOPIC RETROGRADE CHOLANGIOPANCREATOGRAPHY (ERCP);  Surgeon: Sherrilyn Rist, MD;  Location: [facility];  Service: Gastroenterology;  Laterality: N/A;   ERCP N/A 07/26/2018   Procedure: ENDOSCOPIC RETROGRADE CHOLANGIOPANCREATOGRAPHY (ERCP);  Surgeon: Hilarie Fredrickson, MD;  Location: [facility];  Service: Endoscopy;  Laterality: N/A;  stent removal   IR RADIOLOGIST EVAL & MGMT  05/19/2018   LAMINECTOMY N/A 12/13/2014   Procedure: CERVICAL LAMINECTOMY FOR INTRADURAL TUMOR;  Surgeon: Temple Pacini, MD;  Location: [facility];  Service: Neurosurgery;  Laterality: N/A;  posterior   LAPAROSCOPY N/A 07/20/2017   Procedure: LAPAROSCOPY OPERATIVE WITH WEDGE RESECTION RIGHT CORNUA AND PARTIAL SALPINGECTOMY;  Surgeon: Reva Bores, MD;  Location: [facility] ORS;  Service: Gynecology;  Laterality: N/A;   REDUCTION MAMMAPLASTY Bilateral 1998   REMOVAL OF STONES  05/02/2018   Procedure: REMOVAL OF STONES;  Surgeon: Sherrilyn Rist, MD;  Location: [facility];  Service: Gastroenterology;;   Dennison Mascot  05/02/2018   Procedure: SPHINCTEROTOMY;  Surgeon: Sherrilyn Rist, MD;  Location: [facility];  Service: Gastroenterology;;   Francine Graven REMOVAL  07/26/2018   Procedure: STENT REMOVAL;  Surgeon: Hilarie Fredrickson, MD;  Location: [facility];  Service: Endoscopy;;   TOTAL LAPAROSCOPIC HYSTERECTOMY WITH SALPINGECTOMY Bilateral 12/03/2020   Procedure: ATTEMPTED  LAPAROSCOPIC, OPEN ABDOMINAL HYSTERECTOMY WITH BILATERAL SALPINGO-OOPHERECTOMY AND LYSIS OF ADHESIONS;  Surgeon: Lavina Hamman, MD;  Location: [facility] OR;  Service: Gynecology;  Laterality: Bilateral;   VIDEO BRONCHOSCOPY WITH ENDOBRONCHIAL ULTRASOUND  Bilateral 08/10/2023   Procedure: VIDEO BRONCHOSCOPY WITH ENDOBRONCHIAL ULTRASOUND;  Surgeon: Josephine Igo, DO;  Location: [facility];  Service: Cardiopulmonary;  Laterality: Bilateral;  Allergies  Allergen Reactions   Amoxicillin Hives, Rash and Other (See Comments)    PATIENT HAS HAD A PCN REACTION WITH IMMEDIATE RASH, FACIAL/TONGUE/THROAT SWELLING, SOB, OR LIGHTHEADEDNESS WITH HYPOTENSION:  #  #  YES  #  #  Has patient had a PCN reaction causing severe rash involving mucus membranes or skin necrosis: No Has patient had a PCN reaction that required hospitalization: No  Has patient had a PCN reaction occurring within the last 10 years: #  #  #  YES  #  #  #  If all of the above answers are "NO", then may proceed with Cephalosporin use.     Buprenorphine Hcl Itching and Nausea Only   Morphine And Codeine Itching and Nausea Only    Immunization History  Administered Date(s) Administered   Influenza, Seasonal, Injecte, Preservative Fre 09/02/2023    Family History  Problem Relation Age of Onset   Diabetes Mother    Hypertension Mother    Breast cancer Maternal Aunt    Ovarian cancer Maternal Grandmother    Breast cancer Maternal Aunt    Migraines Neg Hx      Current Outpatient Medications:    estradiol (CLIMARA - DOSED IN MG/24 HR) 0.075 mg/24hr patch, Place 0.075 mg onto the skin once a week., Disp: , Rfl:    furosemide (LASIX) 40 MG tablet, Take 40 mg by mouth daily., Disp: , Rfl:    HUMALOG MIX 75/25 KWIKPEN (75-25) 100 UNIT/ML KwikPen, Inject 24 Units into the skin every morning., Disp: , Rfl:    hydrochlorothiazide (HYDRODIURIL) 25 MG tablet, Take 25 mg by mouth daily. , Disp: , Rfl:    Insulin Lispro Prot & Lispro (HUMALOG MIX 75/25 KWIKPEN) (75-25) 100 UNIT/ML Kwikpen, Inject 14 Units into the skin at bedtime., Disp: , Rfl:    oxycodone (ROXICODONE) 30 MG immediate release tablet, Take 30 mg by mouth every 4 (four) hours as needed for pain., Disp: , Rfl:     predniSONE (DELTASONE) 10 MG tablet, Take 1 tablet (10 mg total) by mouth daily with breakfast., Disp: 30 tablet, Rfl: 1   sitaGLIPtin (JANUVIA) 100 MG tablet, Take 100 mg by mouth daily., Disp: , Rfl:    tirzepatide (MOUNJARO) 5 MG/0.5ML Pen, Inject 5 mg into the fatty tissue every 7 (seven) days for diabetes, Disp: 2 mL, Rfl: 1      Objective:   Vitals:   10/13/23 1521  BP: 126/74  Pulse: 93  Temp: 98.3 F (36.8 C)  TempSrc: Oral  SpO2: 100%  Weight: (!) 324 lb (147 kg)  Height: 5\' 3"  (1.6 m)    Estimated body mass index is 57.39 kg/m as calculated from the following:   Height as of this encounter: 5\' 3"  (1.6 m).   Weight as of this encounter: 324 lb (147 kg).  @WEIGHTCHANGE @  Filed Weights   10/13/23 1521  Weight: (!) 324 lb (147 kg)     Physical Exam   General: No distress. obes O2 at rest: no Cane present: no Sitting in wheel chair: no Frail: no Obese: no Neuro: Alert and Oriented x 3. GCS 15. Speech normal Psych: Pleasant Resp:  Barrel Chest - no.  Wheeze - no, Crackles - no, No overt respiratory distress CVS: Normal heart sounds. Murmurs - no Ext: Stigmata of Connective Tissue Disease - no HEENT: Normal upper airway. PEERL +. No post nasal drip        Assessment:       ICD-10-CM   1.  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 Chronic cough  R05.3     3. Current chronic use of systemic steroids  Z79.52     4. Encounter for therapeutic drug monitoring  Z51.81     5. Myalgia  M79.10     6. Nausea  R11.0     7. Abdominal cramps  R10.9          Plan:     Patient Instructions  Pulmonary sarcoidosis [facility]) -diagnosis made September 2024 based on mediastinal adenopathy Chronic cough Current chronic use of systemic steroids Encoubter therapeutic monitoring  - Sarcoid symptoms seem to be doing well on 10 mg prednisone daily  Plan - Continue 10 mg prednisone daily -Hold off on starting methotrexate for the moment -Check blood labs for  therapeutic monitoring with chronic prednisone [see below] -Chest x-ray in 12 weeks  Myalgia Nausea Abdominal cramps  -Unclear if this is a side effect of high blood sugars or any early onset of viral infection or food poisoning  Plan - Check CBC with differential, chemistry, liver function test, and hemoglobin A1c.   Follow-up - 12 weeks r face-to-face visit with nurse practitioner Jairo Ben or Dr. Marchelle Gearing 15-minute visit  -    FOLLOWUP Return in about 12 years (around 10/13/2035) for 15 min visit, Sarcoidosis, with Dr Marchelle Gearing.    SIGNATURE    Dr. Kalman Shan, M.D., F.C.C.P,  Pulmonary and Critical Care Medicine Staff Physician, [facility] Center Director - Interstitial Lung Disease  Program  Pulmonary Fibrosis [facility] [place], [place], [address]  Pager: [phone], If no answer or between  15:00h - 7:00h: call [phone] Telephone: [phone]  3:59 PM 10/13/2023

## 2023-10-14 LAB — HEMOGLOBIN A1C: Hgb A1c MFr Bld: 9.8 % — ABNORMAL HIGH (ref 4.6–6.5)

## 2023-10-14 LAB — BASIC METABOLIC PANEL
BUN: 19 mg/dL (ref 6–23)
CO2: 30 meq/L (ref 19–32)
Calcium: 9.6 mg/dL (ref 8.4–10.5)
Chloride: 97 meq/L (ref 96–112)
Creatinine, Ser: 0.93 mg/dL (ref 0.40–1.20)
GFR: 74.67 mL/min (ref >60.00)
Glucose, Bld: 237 mg/dL — ABNORMAL HIGH (ref 70–99)
Potassium: 3.8 meq/L (ref 3.5–5.1)
Sodium: 136 meq/L (ref 135–145)

## 2023-10-14 LAB — CBC WITH DIFFERENTIAL/PLATELET
Basophils Absolute: 0.1 10*3/uL (ref 0.0–0.1)
Basophils Relative: 1.1 % (ref 0.0–3.0)
Eosinophils Absolute: 0 10*3/uL (ref 0.0–0.7)
Eosinophils Relative: 0.2 % (ref 0.0–5.0)
HCT: 40 % (ref 36.0–46.0)
Hemoglobin: 12.8 g/dL (ref 12.0–15.0)
Lymphocytes Relative: 18.9 % (ref 12.0–46.0)
Lymphs Abs: 2.4 10*3/uL (ref 0.7–4.0)
MCHC: 31.9 g/dL (ref 30.0–36.0)
MCV: 74.3 fL — ABNORMAL LOW (ref 78.0–100.0)
Monocytes Absolute: 0.4 10*3/uL (ref 0.1–1.0)
Monocytes Relative: 3.1 % (ref 3.0–12.0)
Neutro Abs: 9.7 10*3/uL — ABNORMAL HIGH (ref 1.4–7.7)
Neutrophils Relative %: 76.7 % (ref 43.0–77.0)
Platelets: 344 10*3/uL (ref 150.0–400.0)
RBC: 5.39 Mil/uL — ABNORMAL HIGH (ref 3.87–5.11)
RDW: 18.5 % — ABNORMAL HIGH (ref 11.5–15.5)
WBC: 12.6 10*3/uL — ABNORMAL HIGH (ref 4.0–10.5)

## 2023-10-14 LAB — HEPATIC FUNCTION PANEL
ALT: 12 U/L (ref 0–35)
AST: 13 U/L (ref 0–37)
Albumin: 3.8 g/dL (ref 3.5–5.2)
Alkaline Phosphatase: 89 U/L (ref 39–117)
Bilirubin, Direct: 0 mg/dL (ref 0.0–0.3)
Total Bilirubin: 0.5 mg/dL (ref 0.2–1.2)
Total Protein: 7.8 g/dL (ref 6.0–8.3)

## 2023-10-15 ENCOUNTER — Encounter: Payer: Self-pay | Admitting: Internal Medicine

## 2023-10-15 ENCOUNTER — Ambulatory Visit: Payer: 59 | Attending: Internal Medicine | Admitting: Internal Medicine

## 2023-10-15 ENCOUNTER — Ambulatory Visit (INDEPENDENT_AMBULATORY_CARE_PROVIDER_SITE_OTHER): Payer: 59

## 2023-10-15 VITALS — BP 126/76 | HR 112 | Ht 63.0 in | Wt 325.2 lb

## 2023-10-15 DIAGNOSIS — R079 Chest pain, unspecified: Secondary | ICD-10-CM

## 2023-10-15 DIAGNOSIS — D869 Sarcoidosis, unspecified: Secondary | ICD-10-CM

## 2023-10-15 NOTE — Progress Notes (Signed)
  Cardiology Office Note:  .   Date:  10/15/2023  ID:  Betha Loa, DOB 1981-08-25, MRN [id number] PCP: Rometta Emery, MD  [facility] Providers Cardiologist:  None    History of Present Illness: .   Cynthia Rosales is a 42 y.o. female with history of hypertension, diabetes, he follows with pulmonology for pulmonary sarcoidosis.  She saw them 2 days ago and reported some shortness of breath.Patient was found to have enlarged hypermetabolic mediastinal and bilateral hilar adenopathy concerning for possible underlying sarcoidosis.  She under went bronchoscopy, and her biopsy did confirm sarcoidosis.  She was started on prednisone.  She was recommended to have eye exams annually and a referral to cardiology to ensure she does not have cardiac amyloidosis.  She has had no prior cardiac evaluation.  Pro BNP in June was 4.  She is here today for a new visit.  She was reporting some chest pain but EKG just shows sinus tachycardia and no signs of ischemia. She notes it is associated with palpitations. She is on steroids. She noted before this, she notes she persistent pain. She has chronic pain related to a cervical laminectomy secondary to a tumor.   Father had sarcoidosis. Mother has no cardiac disease. Siblings don't have heart disease.   ROS:  per HPI otherwise negative   Studies Reviewed: Marland Kitchen       EKG Interpretation Date/Time:  Friday October 15 2023 14:07:17 EST Ventricular Rate:  112 PR Interval:  140 QRS Duration:  78 QT Interval:  328 QTC Calculation: 447 R Axis:   80  Text Interpretation: Sinus tachycardia When compared with ECG of 10-Aug-2023 13:08, No significant change was found Confirmed by Carolan Clines [phone]) on 10/15/2023 2:08:28 PM  ECG 08/10/2023 shows normal sinus rhythm Risk Assessment/Calculations:        Physical Exam:   VS:   Vitals:   10/15/23 1359  BP: 126/76  Pulse: (!) 112  SpO2: 99%    LMP 03/21/2020    Wt Readings from Last 3 Encounters:   10/13/23 (!) 324 lb (147 kg)  09/02/23 (!) 333 lb (151 kg)  08/18/23 (!) 330 lb (149.7 kg)    GEN: Well nourished, well developed in no acute distress NECK: No JVD; No carotid bruits CARDIAC: tachycardic, no murmurs, rubs, gallops RESPIRATORY:  Clear to auscultation without rales, wheezing or rhonchi  ABDOMEN: Soft, non-tender, non-distended EXTREMITIES:  No edema; No deformity   ASSESSMENT AND PLAN: .   Pulmonary Sarcoidosis - 2 week ziopatch -TTE -Will consider cardiac PET if there is suspicion of sarcoid on her echo or any arrhythmia on her cardiac monitor.  Sinus tachycardia In the setting of prednisone and chronic pain as well as obesity.  This is benign  HTN Well controlled  Morbid obesity We discussed working on increasing physical activity as well as moderating her diet.       Dispo: Follow-up in 1 year  Signed, Maryori Weide, Alben Spittle, MD

## 2023-10-15 NOTE — Patient Instructions (Signed)
Medication Instructions:  No changes  *If you need a refill on your cardiac medications before your next appointment, please call your pharmacy*   Lab Work: None     Testing/Procedures: Echo Your physician has requested that you have an echocardiogram. Echocardiography is a painless test that uses sound waves to create images of your heart. It provides your doctor with information about the size and shape of your heart and how well your heart's chambers and valves are working. This procedure takes approximately one hour. There are no restrictions for this procedure. Please do NOT wear cologne, perfume, aftershave, or lotions (deodorant is allowed). Please arrive 15 minutes prior to your appointment time.  Please note: We ask at that you not bring children with you during ultrasound (echo/ vascular) testing. Due to room size and safety concerns, children are not allowed in the ultrasound rooms during exams. Our front office staff cannot provide observation of children in our lobby area while testing is being conducted. An adult accompanying a patient to their appointment will only be allowed in the ultrasound room at the discretion of the ultrasound technician under special circumstances. We apologize for any inconvenience.    Follow-Up: At [facility], you and your health needs are our priority.  As part of our continuing mission to provide you with exceptional heart care, we have created designated Provider Care Teams.  These Care Teams include your primary Cardiologist (physician) and Advanced Practice Providers (APPs -  Physician Assistants and Nurse Practitioners) who all work together to provide you with the care you need, when you need it.  We recommend signing up for the patient portal called "MyChart".  Sign up information is provided on this After Visit Summary.  MyChart is used to connect with patients for Virtual Visits (Telemedicine).  Patients are able to view lab/test  results, encounter notes, upcoming appointments, etc.  Non-urgent messages can be sent to your provider as well.   To learn more about what you can do with MyChart, go to [email or web address].    Your next appointment:   1 year(s)  Provider:   Dr. Wyline Mood    Other Instructions ZIO XT- Long Term Monitor Instructions  Your physician has requested you wear a ZIO patch monitor for 14 days.  This is a single patch monitor. [employer] supplies one patch monitor per enrollment. Additional stickers are not available. Please do not apply patch if you will be having a Nuclear Stress Test,  Echocardiogram, Cardiac CT, MRI, or Chest Xray during the period you would be wearing the  monitor. The patch cannot be worn during these tests. You cannot remove and re-apply the  ZIO XT patch monitor.  Your ZIO patch monitor will be mailed 3 day USPS to your address on file. It may take 3-5 days  to receive your monitor after you have been enrolled.  Once you have received your monitor, please review the enclosed instructions. Your monitor  has already been registered assigning a specific monitor serial # to you.  Billing and Patient Assistance Program Information  We have supplied [employer] with any of your insurance information on file for billing purposes. [employer] offers a sliding scale Patient Assistance Program for patients that do not have  insurance, or whose insurance does not completely cover the cost of the ZIO monitor.  You must apply for the Patient Assistance Program to qualify for this discounted rate.  To apply, please call [employer] at [phone], select option 4, select option 2, ask to  apply for  Patient Assistance Program. Meredeth Ide will ask your household income, and how many people  are in your household. They will quote your out-of-pocket cost based on that information.  [employer] will also be able to set up a 18-month, interest-free payment plan if needed.  Applying the monitor   Shave  hair from upper left chest.  Hold abrader disc by orange tab. Rub abrader in 40 strokes over the upper left chest as  indicated in your monitor instructions.  Clean area with 4 enclosed alcohol pads. Let dry.  Apply patch as indicated in monitor instructions. Patch will be placed under collarbone on left  side of chest with arrow pointing upward.  Rub patch adhesive wings for 2 minutes. Remove white label marked "1". Remove the white  label marked "2". Rub patch adhesive wings for 2 additional minutes.  While looking in a mirror, press and release button in center of patch. A small green light will  flash 3-4 times. This will be your only indicator that the monitor has been turned on.  Do not shower for the first 24 hours. You may shower after the first 24 hours.  Press the button if you feel a symptom. You will hear a small click. Record Date, Time and  Symptom in the Patient Logbook.  When you are ready to remove the patch, follow instructions on the last 2 pages of Patient  Logbook. Stick patch monitor onto the last page of Patient Logbook.  Place Patient Logbook in the blue and white box. Use locking tab on box and tape box closed  securely. The blue and white box has prepaid postage on it. Please place it in the mailbox as  soon as possible. Your physician should have your test results approximately 7 days after the  monitor has been mailed back to [facility].  Call [facility] Customer Care at [phone] if you have questions regarding  your ZIO XT patch monitor. Call them immediately if you see an orange light blinking on your  monitor.  If your monitor falls off in less than 4 days, contact our Monitor department at [phone].  If your monitor becomes loose or falls off after 4 days call [employer] at [phone] for  suggestions on securing your monitor

## 2023-10-15 NOTE — Progress Notes (Unsigned)
Enrolled patient for a 14 day Zio XT  monitor to be mailed to patients home  °

## 2023-10-20 DIAGNOSIS — D869 Sarcoidosis, unspecified: Secondary | ICD-10-CM

## 2023-10-20 DIAGNOSIS — R079 Chest pain, unspecified: Secondary | ICD-10-CM

## 2023-11-05 ENCOUNTER — Other Ambulatory Visit: Payer: Self-pay | Admitting: Neurosurgery

## 2023-11-05 DIAGNOSIS — C719 Malignant neoplasm of brain, unspecified: Secondary | ICD-10-CM

## 2023-12-08 ENCOUNTER — Ambulatory Visit
Admission: RE | Admit: 2023-12-08 | Discharge: 2023-12-08 | Disposition: A | Discharge status: Home / Self Care | Payer: 59 | Source: Ambulatory Visit | Attending: Neurosurgery | Admitting: Neurosurgery

## 2023-12-08 DIAGNOSIS — C719 Malignant neoplasm of brain, unspecified: Secondary | ICD-10-CM

## 2023-12-09 ENCOUNTER — Encounter (HOSPITAL_COMMUNITY): Payer: Self-pay | Admitting: Internal Medicine

## 2023-12-09 ENCOUNTER — Ambulatory Visit (HOSPITAL_COMMUNITY): Payer: 59 | Attending: Cardiology

## 2023-12-17 ENCOUNTER — Other Ambulatory Visit: Payer: Self-pay

## 2023-12-17 DIAGNOSIS — R079 Chest pain, unspecified: Secondary | ICD-10-CM

## 2023-12-17 DIAGNOSIS — D869 Sarcoidosis, unspecified: Secondary | ICD-10-CM

## 2024-01-11 ENCOUNTER — Other Ambulatory Visit: Payer: Self-pay | Admitting: Internal Medicine

## 2024-01-11 ENCOUNTER — Ambulatory Visit (HOSPITAL_COMMUNITY): Payer: 59

## 2024-01-14 ENCOUNTER — Ambulatory Visit: Payer: 59 | Attending: Family Medicine

## 2024-01-14 DIAGNOSIS — M6281 Muscle weakness (generalized): Secondary | ICD-10-CM | POA: Diagnosis present

## 2024-01-14 DIAGNOSIS — R278 Other lack of coordination: Secondary | ICD-10-CM | POA: Insufficient documentation

## 2024-01-14 DIAGNOSIS — R293 Abnormal posture: Secondary | ICD-10-CM | POA: Diagnosis present

## 2024-01-14 NOTE — Therapy (Signed)
OUTPATIENT PHYSICAL THERAPY CERVICAL EVALUATION   Patient Name: CAITLYNE INGHAM MRN: [id number] DOB:Nov 30, 1981, 43 y.o., female Today's Date: 01/14/2024  END OF SESSION:  PT End of Session - 01/14/24 1419     Visit Number 1    Number of Visits 7    Date for PT Re-Evaluation 03/10/24   pushed out to allow for scheduling delay   Authorization Type UHC dual/medicaid    PT Start Time 1442    PT Stop Time 1520    PT Time Calculation (min) 38 min    Activity Tolerance Patient limited by pain    Behavior During Therapy [facility] for tasks assessed/performed             Past Medical History:  Diagnosis Date   Abscess of bursa, left elbow 06/2013   Treated with I and D/antibiotics.    Arthritis    Asthma    Depression    History of blood transfusion    "after I had one of my kids"   History of kidney stones    Hypertension    Ovarian cyst    Paralysis (HCC)    "no feeling from the waist down"   Schizophrenia (HCC)    Sickle cell disease ruled out    Patient with no evidence of Hgb S on eletrophoresis x3   Type II diabetes mellitus (HCC)    Past Surgical History:  Procedure Laterality Date   APPENDECTOMY  2013   BILIARY STENT PLACEMENT N/A 05/02/2018   Procedure: BILIARY STENT PLACEMENT;  Surgeon: Sherrilyn Rist, MD;  Location: [facility] ENDOSCOPY;  Service: Gastroenterology;  Laterality: N/A;   BOWEL RESECTION  12/03/2020   Procedure: SMALL BOWEL RESECTION;  Surgeon: Sheliah Hatch, De Blanch, MD;  Location[facility] OR;  Service: General;;   BRONCHIAL NEEDLE ASPIRATION BIOPSY  08/10/2023   Procedure: BRONCHIAL NEEDLE ASPIRATION BIOPSIES;  Surgeon: Josephine Igo, DO;  Location: [facility] ENDOSCOPY;  Service: Cardiopulmonary;;   CARPAL TUNNEL RELEASE     CESAREAN SECTION  2000; 2007; 2011   CHOLECYSTECTOMY N/A 04/03/2018   Procedure: LAPAROSCOPIC CHOLECYSTECTOMY;  Surgeon: Emelia Loron, MD;  Location: [facility] OR;  Service: General;  Laterality: N/A;   DILATATION & CURETTAGE/HYSTEROSCOPY WITH  MYOSURE N/A 10/22/2020   Procedure: DILATATION & CURETTAGE/HYSTEROSCOPY WITH MYOSURE;  Surgeon: Lavina Hamman, MD;  Location: [facility] OR;  Service: Gynecology;  Laterality: N/A;   DILATION AND CURETTAGE OF UTERUS     ERCP N/A 05/02/2018   Procedure: ENDOSCOPIC RETROGRADE CHOLANGIOPANCREATOGRAPHY (ERCP);  Surgeon: Sherrilyn Rist, MD;  Location: [facility];  Service: Gastroenterology;  Laterality: N/A;   ERCP N/A 07/26/2018   Procedure: ENDOSCOPIC RETROGRADE CHOLANGIOPANCREATOGRAPHY (ERCP);  Surgeon: Hilarie Fredrickson, MD;  Location: [facility];  Service: Endoscopy;  Laterality: N/A;  stent removal   IR RADIOLOGIST EVAL & MGMT  05/19/2018   LAMINECTOMY N/A 12/13/2014   Procedure: CERVICAL LAMINECTOMY FOR INTRADURAL TUMOR;  Surgeon: Temple Pacini, MD;  Location: [facility]O [facility]S;  Service: Neurosurgery;  Laterality: N/A;  posterior   LAPAROSCOPY N/A 07/20/2017   Procedure: LAPAROSCOPY OPERATIVE WITH WEDGE RESECTION RIGHT CORNUA AND PARTIAL SALPINGECTOMY;  Surgeon: Reva Bores, MD;  Location: [facility] ORS;  Service: Gynecology;  Laterality: N/A;   REDUCTION MAMMAPLASTY Bilateral 1998   REMOVAL OF STONES  05/02/2018   Procedure: REMOVAL OF STONES;  Surgeon: Sherrilyn Rist, MD;  Location: [facility];  Service: Gastroenterology;;   Dennison Mascot  05/02/2018   Procedure: SPHINCTEROTOMY;  Surgeon: Sherrilyn Rist, MD;  Location: [facility];  Service: Gastroenterology;;   Francine Graven REMOVAL  07/26/2018   Procedure: STENT REMOVAL;  Surgeon: Hilarie Fredrickson, MD;  Location: [facility];  Service: Endoscopy;;   TOTAL LAPAROSCOPIC HYSTERECTOMY WITH SALPINGECTOMY Bilateral 12/03/2020   Procedure: ATTEMPTED  LAPAROSCOPIC, OPEN ABDOMINAL HYSTERECTOMY WITH BILATERAL SALPINGO-OOPHERECTOMY AND LYSIS OF ADHESIONS;  Surgeon: Lavina Hamman, MD;  Location: [facility] OR;  Service: Gynecology;  Laterality: Bilateral;   VIDEO BRONCHOSCOPY WITH ENDOBRONCHIAL ULTRASOUND Bilateral 08/10/2023   Procedure: VIDEO BRONCHOSCOPY WITH ENDOBRONCHIAL  ULTRASOUND;  Surgeon: Josephine Igo, DO;  Location: [facility] ENDOSCOPY;  Service: Cardiopulmonary;  Laterality: Bilateral;   Patient Active Problem List   Diagnosis Date Noted   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   Obesity, Class III, BMI 40-49.9 (morbid obesity) (HCC) 07/14/2022   Hypokalemia 07/14/2022   UTI (urinary tract infection) 07/14/2022   AKI (acute kidney injury) (HCC) 07/09/2022   Acute low back pain 07/09/2022   Uterine leiomyoma 12/03/2020   S/P abdominal hysterectomy 12/03/2020   Bile leak, postoperative    Choledocholithiasis    Intra-abdominal fluid collection    Anemia 04/28/2018   Morbid obesity with body mass index (BMI) of 50.0 to 59.9 in adult [facility]) 04/02/2018   Mass of uterus 04/02/2018   Transaminitis 04/02/2018   Acute cholecystitis s/p lap cholecystectomy 04/03/2018 04/01/2018   Pregnancy, ectopic, cornual or cervical 07/21/2017   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   Neurogenic bladder 08/23/2015   Bilateral carpal tunnel syndrome 08/23/2015   Paraparesis of both lower limbs (HCC) 07/14/2015   Ileus, postoperative (HCC) 12/25/2014   Type 2 diabetes mellitus with obesity (HCC) 12/25/2014   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HCC) 12/24/2014   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   Ileus of unspecified type (HCC)    Atelectasis    Bowel obstruction (HCC)    Encounter for central line care    Ileus [facility])    Intestinal occlusion (HCC)    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   Headache 11/19/2014   Neck pain 11/19/2014   Right sided weakness 11/19/2014   Paresthesias 11/19/2014   Nightmares 09/11/2014   Essential hypertension 03/28/2014   Nephrolithiasis 03/27/2014   Type 2 diabetes mellitus without complication, with long-term current use of insulin (HCC) 03/27/2014   Abdominal pain 03/27/2014    PCP: Earlie Lou, MD  REFERRING PROVIDER: Danae Chen, MD  REFERRING DIAG: M54.2 (ICD-10-CM) - Cervicalgia   THERAPY DIAG:  Muscle weakness  (generalized)  Abnormal posture  Other lack of coordination  Rationale for Evaluation and Treatment: Rehabilitation  ONSET DATE: 01/06/24 referral  SUBJECTIVE:  SUBJECTIVE STATEMENT: Patient arrives to clinic alone, no AD. Reports constant pain in her neck and back related to a spinal tumor, which was resected in 2016. Reports that pain rx (including prescribed pain rx), heat, ice, position changes do not reduce her pain. This pain has been going on for years and the pain increased after the sx. Denies any specific provoking factors to increase pain. Has previously tried aquatic therapy and it offered minimal relief.  Hand dominance: Right  PERTINENT HISTORY:  Arthritis, depression, HTN, paralysis, schizophrenia, DMII  PAIN:  Are you having pain? Yes: NPRS scale: "past a 10/10" Pain location: from origin of incision to mid/low back Pain description: burning, stabbing, aching Aggravating factors: none reported Relieving factors: none reported  PRECAUTIONS: Fall  RED FLAGS: Cervical red flags: Dysphagia No, Dysmetria No, Diplopia No, Nystagmus No, and Nausea No, Bowel or bladder incontinence: No, Spinal tumors: Yes: h/o, and Cauda equina syndrome: No     WEIGHT BEARING RESTRICTIONS: No  FALLS:  Has patient fallen in last 6 months? No  LIVING ENVIRONMENT: Lives with: lives with their family Lives in: House/apartment Stairs: No Has following equipment at home: Environmental consultant - 4 wheeled, Wheelchair (power), and Grab bars  PLOF: Needs assistance with ADLs and Needs assistance with homemaking  PATIENT GOALS: "I don't know what else to do"  OBJECTIVE:  Note: Objective measures were completed at Evaluation unless otherwise noted.  DIAGNOSTIC FINDINGS:  12/08/23 c-spine MRI IMPRESSION: 1.  Postsurgical changes involving the posterior cord at the level of C4, stable from previous. No findings to suggest recurrent tumor on this noncontrast examination. 2. Mild for age cervical spondylosis as above without significant stenosis or neural impingement.  PATIENT SURVEYS:  NDI 76%  COGNITION: Overall cognitive status: Within functional limits for tasks assessed  SENSATION: Light touch: Impaired  Hot/Cold: Impaired  Proprioception: Impaired   POSTURE: rounded shoulders and forward head  PALPATION: No trigger points, increased tension around c-spine incision   CERVICAL ROM:  ~50% limited by pain, most pain with R cervical rotation  UPPER EXTREMITY ROM: Guarded and painful  UPPER EXTREMITY MMT: Limited by pain  CERVICAL SPECIAL TESTS:  Spurling's test: Positive, Distraction test: Negative, and Sharp pursor's test: Negative  TREATMENT  n/a eval   PATIENT EDUCATION:  Education details: PT POC, exam findings, yellow/red flags Person educated: Patient Education method: Explanation Education comprehension: verbalized understanding and needs further education  HOME EXERCISE PROGRAM: To be provided  ASSESSMENT:  CLINICAL IMPRESSION: Patient is a 43 y.o. female who was seen today for physical therapy evaluation and treatment for cervicalgia. She has a long complicated medical history contributing to the severity of her neck pain. She currently rates it at a "10/10," but when it's greater than that, that's when she'll seek medical attention. She scorea 76% on the NDI reflecting profound disability due to her neck. She would benefit from a trial of skilled PT services to address the above mentioned deficits.    OBJECTIVE IMPAIRMENTS: Abnormal gait, decreased activity tolerance, decreased knowledge of condition, decreased mobility, decreased strength, hypomobility, increased fascial restrictions, impaired sensation, impaired UE functional use, improper body mechanics,  postural dysfunction, and pain.   ACTIVITY LIMITATIONS: carrying, lifting, dressing, reach over head, hygiene/grooming, locomotion level, and caring for others  PARTICIPATION LIMITATIONS: meal prep, cleaning, driving, shopping, community activity, and occupation  PERSONAL FACTORS: Behavior pattern, Fitness, Past/current experiences, Time since onset of injury/illness/exacerbation, Transportation, and 3+ comorbidities: see above  are also affecting patient's functional outcome.   REHAB POTENTIAL:  Fair chronicity, unknown etiology  CLINICAL DECISION MAKING: Stable/uncomplicated  EVALUATION COMPLEXITY: Moderate   GOALS: Goals reviewed with patient? Yes  SHORT TERM GOALS: Target date: 02/11/24  Pt will be independent with initial HEP for improved pain relief and posture  Baseline:  to be provided Goal status: INITIAL  2.  Patient will improve NDI score to </= 65% to demonstrate a reduction in pain impacting mobility  Baseline: 76% Goal status: INITIAL    LONG TERM GOALS: Target date: 03/10/24  Pt will be independent with final HEP for improved pain relief and mobility   Baseline: to be provided Goal status: INITIAL  2.  Patient will improve NDI score to </= 55% to demonstrate a reduction in pain and improved mobility Baseline: 76% Goal status: INITIAL     PLAN:  PT FREQUENCY: 1x/week  PT DURATION: 6 weeks  PLANNED INTERVENTIONS: 97164- PT Re-evaluation, 97110-Therapeutic exercises, 97530- Therapeutic activity, 97112- Neuromuscular re-education, 97535- Self Care, 47829- Manual therapy, [phone]- Gait training, [phone]- Orthotic Fit/training, [phone]- Canalith repositioning, [phone]- Aquatic Therapy, Patient/Family education, Balance training, Taping, Dry Needling, Joint mobilization, Spinal mobilization, Scar mobilization, Vestibular training, Visual/preceptual remediation/compensation, DME instructions, Cryotherapy, and Moist heat  PLAN FOR NEXT SESSION: HEP, dry needing?, posture  training   Westley Foots, PT Westley Foots, PT, DPT, CBIS  01/14/2024, 3:24 PM

## 2024-01-21 ENCOUNTER — Ambulatory Visit: Payer: 59 | Admitting: Physical Therapy

## 2024-01-25 ENCOUNTER — Ambulatory Visit: Payer: 59 | Admitting: Rehabilitation

## 2024-01-31 ENCOUNTER — Ambulatory Visit (HOSPITAL_COMMUNITY): Payer: 59 | Attending: Internal Medicine

## 2024-01-31 DIAGNOSIS — D869 Sarcoidosis, unspecified: Secondary | ICD-10-CM | POA: Diagnosis present

## 2024-01-31 DIAGNOSIS — R079 Chest pain, unspecified: Secondary | ICD-10-CM | POA: Diagnosis present

## 2024-01-31 LAB — ECHOCARDIOGRAM COMPLETE
Area-P 1/2: 3.06 cm2
Calc EF: 57.8 %
S' Lateral: 2.53 cm
Single Plane A2C EF: 57.9 %
Single Plane A4C EF: 57.9 %

## 2024-01-31 MED ORDER — PERFLUTREN LIPID MICROSPHERE
1.0000 mL | INTRAVENOUS | Status: AC | PRN
  Administered 2024-01-31: 3 mL via INTRAVENOUS

## 2024-02-02 ENCOUNTER — Telehealth: Payer: Self-pay | Admitting: Internal Medicine

## 2024-02-02 NOTE — Telephone Encounter (Signed)
 Pt was returning call to nurse regarding results and is requesting a callback. Please advise.

## 2024-02-02 NOTE — Telephone Encounter (Signed)
 Returned patient's phone call. Patient verified name and DOB. Per Dr. Wyline Mood: Please let Mrs Agudelo know that her echo was normal.  Patient verbalized understanding.   Josie LPN

## 2024-02-04 ENCOUNTER — Ambulatory Visit: Payer: 59 | Attending: Family Medicine | Admitting: Physical Therapy

## 2024-02-04 ENCOUNTER — Encounter: Payer: Self-pay | Admitting: Physical Therapy

## 2024-02-04 DIAGNOSIS — Z9181 History of falling: Secondary | ICD-10-CM | POA: Insufficient documentation

## 2024-02-04 DIAGNOSIS — R262 Difficulty in walking, not elsewhere classified: Secondary | ICD-10-CM | POA: Diagnosis present

## 2024-02-04 DIAGNOSIS — R293 Abnormal posture: Secondary | ICD-10-CM | POA: Diagnosis present

## 2024-02-04 DIAGNOSIS — M6281 Muscle weakness (generalized): Secondary | ICD-10-CM | POA: Insufficient documentation

## 2024-02-04 DIAGNOSIS — R278 Other lack of coordination: Secondary | ICD-10-CM | POA: Diagnosis present

## 2024-02-04 DIAGNOSIS — R2681 Unsteadiness on feet: Secondary | ICD-10-CM | POA: Diagnosis present

## 2024-02-04 NOTE — Therapy (Signed)
 OUTPATIENT PHYSICAL THERAPY CERVICAL TREATMENT   Patient Name: TENNIE GRUSSING MRN: [id number] DOB:04-25-1981, 43 y.o., female Today's Date: 02/04/2024  END OF SESSION:  PT End of Session - 02/04/24 1451     Visit Number 2    Number of Visits 7    Date for PT Re-Evaluation 03/10/24   pushed out to allow for scheduling delay   Authorization Type [employer] dual/medicaid    PT Start Time 1442    PT Stop Time 1521    PT Time Calculation (min) 39 min    Activity Tolerance Patient limited by pain    Behavior During Therapy [facility] for tasks assessed/performed             Past Medical History:  Diagnosis Date   Abscess of bursa, left elbow 06/2013   Treated with I and D/antibiotics.    Arthritis    Asthma    Depression    History of blood transfusion    "after I had one of my kids"   History of kidney stones    Hypertension    Ovarian cyst    Paralysis (HCC)    "no feeling from the waist down"   Schizophrenia (HCC)    Sickle cell disease ruled out    Patient with no evidence of Hgb S on eletrophoresis x3   Type II diabetes mellitus (HCC)    Past Surgical History:  Procedure Laterality Date   APPENDECTOMY  2013   BILIARY STENT PLACEMENT N/A 05/02/2018   Procedure: BILIARY STENT PLACEMENT;  Surgeon: Sherrilyn Rist, MD;  Location: [facility];  Service: Gastroenterology;  Laterality: N/A;   BOWEL RESECTION  12/03/2020   Procedure: SMALL BOWEL RESECTION;  Surgeon: Sheliah Hatch, De Blanch, MD;  Location: [facility] OR;  Service: General;;   BRONCHIAL NEEDLE ASPIRATION BIOPSY  08/10/2023   Procedure: BRONCHIAL NEEDLE ASPIRATION BIOPSIES;  Surgeon: Josephine Igo, DO;  Location: [facility];  Service: Cardiopulmonary;;   CARPAL TUNNEL RELEASE     CESAREAN SECTION  2000; 2007; 2011   CHOLECYSTECTOMY N/A 04/03/2018   Procedure: LAPAROSCOPIC CHOLECYSTECTOMY;  Surgeon: Emelia Loron, MD;  Location: [facility] OR;  Service: General;  Laterality: N/A;   DILATATION & CURETTAGE/HYSTEROSCOPY WITH MYOSURE  N/A 10/22/2020   Procedure: DILATATION & CURETTAGE/HYSTEROSCOPY WITH MYOSURE;  Surgeon: Lavina Hamman, MD;  Location: [facility] OR;  Service: Gynecology;  Laterality: N/A;   DILATION AND CURETTAGE OF UTERUS     ERCP N/A 05/02/2018   Procedure: ENDOSCOPIC RETROGRADE CHOLANGIOPANCREATOGRAPHY (ERCP);  Surgeon: Sherrilyn Rist, MD;  Location: [facility];  Service: Gastroenterology;  Laterality: N/A;   ERCP N/A 07/26/2018   Procedure: ENDOSCOPIC RETROGRADE CHOLANGIOPANCREATOGRAPHY (ERCP);  Surgeon: Hilarie Fredrickson, MD;  Location: [facility];  Service: Endoscopy;  Laterality: N/A;  stent removal   IR RADIOLOGIST EVAL & MGMT  05/19/2018   LAMINECTOMY N/A 12/13/2014   Procedure: CERVICAL LAMINECTOMY FOR INTRADURAL TUMOR;  Surgeon: Temple Pacini, MD;  Location: [facility];  Service: Neurosurgery;  Laterality: N/A;  posterior   LAPAROSCOPY N/A 07/20/2017   Procedure: LAPAROSCOPY OPERATIVE WITH WEDGE RESECTION RIGHT CORNUA AND PARTIAL SALPINGECTOMY;  Surgeon: Reva Bores, MD;  Location: [facility] ORS;  Service: Gynecology;  Laterality: N/A;   REDUCTION MAMMAPLASTY Bilateral 1998   REMOVAL OF STONES  05/02/2018   Procedure: REMOVAL OF STONES;  Surgeon: Sherrilyn Rist, MD;  Location: [facility];  Service: Gastroenterology;;   Dennison Mascot  05/02/2018   Procedure: SPHINCTEROTOMY;  Surgeon: Sherrilyn Rist, MD;  Location: [facility];  Service: Gastroenterology;;   Francine Graven REMOVAL  07/26/2018   Procedure: STENT REMOVAL;  Surgeon: Hilarie Fredrickson, MD;  Location: [facility];  Service: Endoscopy;;   TOTAL LAPAROSCOPIC HYSTERECTOMY WITH SALPINGECTOMY Bilateral 12/03/2020   Procedure: ATTEMPTED  LAPAROSCOPIC, OPEN ABDOMINAL HYSTERECTOMY WITH BILATERAL SALPINGO-OOPHERECTOMY AND LYSIS OF ADHESIONS;  Surgeon: Lavina Hamman, MD;  Location: [facility] OR;  Service: Gynecology;  Laterality: Bilateral;   VIDEO BRONCHOSCOPY WITH ENDOBRONCHIAL ULTRASOUND Bilateral 08/10/2023   Procedure: VIDEO BRONCHOSCOPY WITH ENDOBRONCHIAL  ULTRASOUND;  Surgeon: Josephine Igo, DO;  Location: [facility];  Service: Cardiopulmonary;  Laterality: Bilateral;   Patient Active Problem List   Diagnosis Date Noted   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   Obesity, Class III, BMI 40-49.9 (morbid obesity) (HCC) 07/14/2022   Hypokalemia 07/14/2022   UTI (urinary tract infection) 07/14/2022   AKI (acute kidney injury) (HCC) 07/09/2022   Acute low back pain 07/09/2022   Uterine leiomyoma 12/03/2020   S/P abdominal hysterectomy 12/03/2020   Bile leak, postoperative    Choledocholithiasis    Intra-abdominal fluid collection    Anemia 04/28/2018   Morbid obesity with body mass index (BMI) of 50.0 to 59.9 in adult [facility]) 04/02/2018   Mass of uterus 04/02/2018   Transaminitis 04/02/2018   Acute cholecystitis s/p lap cholecystectomy 04/03/2018 04/01/2018   Pregnancy, ectopic, cornual or cervical 07/21/2017   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   Neurogenic bladder 08/23/2015   Bilateral carpal tunnel syndrome 08/23/2015   Paraparesis of both lower limbs (HCC) 07/14/2015   Ileus, postoperative (HCC) 12/25/2014   Type 2 diabetes mellitus with obesity (HCC) 12/25/2014   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HCC) 12/24/2014   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   Ileus of unspecified type (HCC)    Atelectasis    Bowel obstruction (HCC)    Encounter for central line care    Ileus [facility])    Intestinal occlusion (HCC)    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   Headache 11/19/2014   Neck pain 11/19/2014   Right sided weakness 11/19/2014   Paresthesias 11/19/2014   Nightmares 09/11/2014   Essential hypertension 03/28/2014   Nephrolithiasis 03/27/2014   Type 2 diabetes mellitus without complication, with long-term current use of insulin (HCC) 03/27/2014   Abdominal pain 03/27/2014    PCP: Earlie Lou, MD  REFERRING PROVIDER: Danae Chen, MD  REFERRING DIAG: M54.2 (ICD-10-CM) - Cervicalgia   THERAPY DIAG:  Muscle weakness  (generalized)  Abnormal posture  Other lack of coordination  Difficulty in walking, not elsewhere classified  Unsteadiness on feet  History of falling  Rationale for Evaluation and Treatment: Rehabilitation  ONSET DATE: 01/06/24 referral  SUBJECTIVE:  SUBJECTIVE STATEMENT: Patient arrives to clinic alone, no AD. She ambulates into clinic while texting.  Patient rates ongoing pain as 10/10 - PT informed this is emergency level pain with pt reaffirming no emergency, but 10/10 pain. Hand dominance: Right  PERTINENT HISTORY:  Arthritis, depression, HTN, paralysis, schizophrenia, DMII  PAIN:  Are you having pain? Yes: NPRS scale: "past a 10/10" Pain location: from origin of incision to mid/low back Pain description: burning, stabbing, aching Aggravating factors: none reported Relieving factors: none reported  PRECAUTIONS: Fall  RED FLAGS: Cervical red flags: Dysphagia No, Dysmetria No, Diplopia No, Nystagmus No, and Nausea No, Bowel or bladder incontinence: No, Spinal tumors: Yes: h/o, and Cauda equina syndrome: No     WEIGHT BEARING RESTRICTIONS: No  FALLS:  Has patient fallen in last 6 months? No  LIVING ENVIRONMENT: Lives with: lives with their family Lives in: House/apartment Stairs: No Has following equipment at home: Environmental consultant - 4 wheeled, Wheelchair (power), and Grab bars  PLOF: Needs assistance with ADLs and Needs assistance with homemaking  PATIENT GOALS: "I don't know what else to do"  OBJECTIVE:  Note: Objective measures were completed at Evaluation unless otherwise noted.  DIAGNOSTIC FINDINGS:  12/08/23 c-spine MRI IMPRESSION: 1. Postsurgical changes involving the posterior cord at the level of C4, stable from previous. No findings to suggest recurrent tumor  on this noncontrast examination. 2. Mild for age cervical spondylosis as above without significant stenosis or neural impingement.  PATIENT SURVEYS:  NDI 76%  COGNITION: Overall cognitive status: Within functional limits for tasks assessed  SENSATION: Light touch: Impaired  Hot/Cold: Impaired  Proprioception: Impaired   POSTURE: rounded shoulders and forward head  PALPATION: No trigger points, increased tension around c-spine incision   CERVICAL ROM:  ~50% limited by pain, most pain with R cervical rotation  UPPER EXTREMITY ROM: Guarded and painful  UPPER EXTREMITY MMT: Limited by pain  CERVICAL SPECIAL TESTS:  Spurling's test: Positive, Distraction test: Negative, and Sharp pursor's test: Negative  TREATMENT -Arm bike x4 minutes forward up to level 4.0 and backwards regressing from level 4.0 over 4 minutes, pt with more difficulty on right side due to reported paresthesias -Prone I, Y, T, W x10 each -Prone press on elbow 2x10 -Supine bridges 2x10 -Hook-lying cervical retraction 2x12 w/ 1 second hold -Hook-lying chin tucks 2x10 no hold -Seated shoulder retraction w/ band 3x12  PATIENT EDUCATION:  Education details: Initial HEP - modifications and resting to modulate pain response. Person educated: Patient Education method: Explanation Education comprehension: verbalized understanding and needs further education  HOME EXERCISE PROGRAM: Access Code: [email or web address] URL: [email or web address] Date: 02/04/2024 Prepared by: Camille Bal  Exercises - Prone Press Up On Elbows  - 1 x daily - 5 x weekly - 2 sets - 10 reps - Supine Bridge  - 1 x daily - 5 x weekly - 2 sets - 10 reps - Supine Cervical Retraction with Towel  - 1 x daily - 5 x weekly - 2 sets - 12 reps - 1-2 seconds hold - Supine Chin Tuck  - 1 x daily - 5 x weekly - 2 sets - 10 reps - Scapular retraction with resistance  - 1 x daily - 5 x weekly - 3 sets - 10 reps  ASSESSMENT:  CLINICAL  IMPRESSION: Emphasis of skilled session today on initiating an HEP to address postural deficits and support patient's spine.  She is able to tolerate various positioning from prone to hook-lying and demonstrated independence in transfers.  Her pain response  was elevated with chin tucks and retro-cycling.  PT will assess progress with HEP and need for further services at next session.  OBJECTIVE IMPAIRMENTS: Abnormal gait, decreased activity tolerance, decreased knowledge of condition, decreased mobility, decreased strength, hypomobility, increased fascial restrictions, impaired sensation, impaired UE functional use, improper body mechanics, postural dysfunction, and pain.   ACTIVITY LIMITATIONS: carrying, lifting, dressing, reach over head, hygiene/grooming, locomotion level, and caring for others  PARTICIPATION LIMITATIONS: meal prep, cleaning, driving, shopping, community activity, and occupation  PERSONAL FACTORS: Behavior pattern, Fitness, Past/current experiences, Time since onset of injury/illness/exacerbation, Transportation, and 3+ comorbidities: see above  are also affecting patient's functional outcome.   REHAB POTENTIAL: Fair chronicity, unknown etiology  CLINICAL DECISION MAKING: Stable/uncomplicated  EVALUATION COMPLEXITY: Moderate   GOALS: Goals reviewed with patient? Yes  SHORT TERM GOALS: Target date: 02/11/24  Pt will be independent with initial HEP for improved pain relief and posture  Baseline:  to be provided Goal status: INITIAL  2.  Patient will improve NDI score to </= 65% to demonstrate a reduction in pain impacting mobility  Baseline: 76% Goal status: INITIAL    LONG TERM GOALS: Target date: 03/10/24  Pt will be independent with final HEP for improved pain relief and mobility   Baseline: to be provided Goal status: INITIAL  2.  Patient will improve NDI score to </= 55% to demonstrate a reduction in pain and improved mobility Baseline: 76% Goal status:  INITIAL     PLAN:  PT FREQUENCY: 1x/week  PT DURATION: 6 weeks  PLANNED INTERVENTIONS: 97164- PT Re-evaluation, 97110-Therapeutic exercises, 97530- Therapeutic activity, 97112- Neuromuscular re-education, 97535- Self Care, 16109- Manual therapy, [phone]- Gait training, [phone]- Orthotic Fit/training, [phone]- Canalith repositioning, [phone]- Aquatic Therapy, Patient/Family education, Balance training, Taping, Dry Needling, Joint mobilization, Spinal mobilization, Scar mobilization, Vestibular training, Visual/preceptual remediation/compensation, DME instructions, Cryotherapy, and Moist heat  PLAN FOR NEXT SESSION: HEP modifications, posture training, SciFit?  Assess LTGs- D/C?   Sadie Haber, PT, DPT  02/04/2024, 3:22 PM

## 2024-02-04 NOTE — Patient Instructions (Signed)
 Access Code: [email or web address] URL: [email or web address] Date: 02/04/2024 Prepared by: Camille Bal  Exercises - Prone Press Up On Elbows  - 1 x daily - 5 x weekly - 2 sets - 10 reps - Supine Bridge  - 1 x daily - 5 x weekly - 2 sets - 10 reps - Supine Cervical Retraction with Towel  - 1 x daily - 5 x weekly - 2 sets - 12 reps - 1-2 seconds hold - Supine Chin Tuck  - 1 x daily - 5 x weekly - 2 sets - 10 reps - Scapular retraction with resistance  - 1 x daily - 5 x weekly - 3 sets - 10 reps

## 2024-02-08 ENCOUNTER — Ambulatory Visit: Payer: 59 | Admitting: Rehabilitation

## 2024-02-16 NOTE — Telephone Encounter (Unsigned)
 Copied from CRM [id number]. Topic: Clinical - Medical Advice >> Feb 16, 2024  2:31 PM Gildardo Pounds wrote: Reason for CRM: [employer] because patient is not taking Statin for her diabetes diagnosis. They recommend the patient take it. Callback number [phone]

## 2024-02-17 ENCOUNTER — Telehealth: Payer: Self-pay | Admitting: Internal Medicine

## 2024-02-17 MED ORDER — PREDNISONE 10 MG PO TABS: 10.0000 mg | ORAL_TABLET | Freq: Every day | ORAL | 2 refills | Status: AC

## 2024-02-17 NOTE — Telephone Encounter (Signed)
 Spoke with the pt  She is due for appt  Scheduled next available with MR and refilled pred to get her through until seen  Nothing further needed

## 2024-02-17 NOTE — Telephone Encounter (Signed)
 PT states [facility] told her we are dcln her pred.   [facility] [address]   Please call pt w/any questions @ [phone]

## 2024-02-18 ENCOUNTER — Encounter: Payer: Self-pay | Admitting: Physical Therapy

## 2024-02-18 ENCOUNTER — Ambulatory Visit: Payer: 59 | Admitting: Physical Therapy

## 2024-02-18 VITALS — BP 143/86 | HR 94

## 2024-02-18 DIAGNOSIS — R262 Difficulty in walking, not elsewhere classified: Secondary | ICD-10-CM

## 2024-02-18 DIAGNOSIS — R2681 Unsteadiness on feet: Secondary | ICD-10-CM

## 2024-02-18 DIAGNOSIS — R293 Abnormal posture: Secondary | ICD-10-CM

## 2024-02-18 DIAGNOSIS — M6281 Muscle weakness (generalized): Secondary | ICD-10-CM | POA: Diagnosis not present

## 2024-02-18 DIAGNOSIS — R278 Other lack of coordination: Secondary | ICD-10-CM

## 2024-02-18 DIAGNOSIS — Z9181 History of falling: Secondary | ICD-10-CM

## 2024-02-18 NOTE — Therapy (Signed)
 OUTPATIENT PHYSICAL THERAPY CERVICAL TREATMENT - Re-Cert   Patient Name: Cynthia Rosales MRN: [id number] DOB:May 24, 1981, 43 y.o., female Today's Date: 02/18/2024  END OF SESSION:  PT End of Session - 02/18/24 1458     Visit Number 3    Number of Visits 15   7 + 8   Date for PT Re-Evaluation 40/98/11   re-cert to allow for aquatic scheduling   Authorization Type [employer] dual/medicaid    PT Start Time 1448    PT Stop Time 1532    PT Time Calculation (min) 44 min    Equipment Utilized During Treatment Gait belt    Activity Tolerance Patient limited by pain    Behavior During Therapy WFL for tasks assessed/performed             Past Medical History:  Diagnosis Date   Abscess of bursa, left elbow 06/2013   Treated with I and D/antibiotics.    Arthritis    Asthma    Depression    History of blood transfusion    "after I had one of my kids"   History of kidney stones    Hypertension    Ovarian cyst    Paralysis (HCC)    "no feeling from the waist down"   Schizophrenia (HCC)    Sickle cell disease ruled out    Patient with no evidence of Hgb S on eletrophoresis x3   Type II diabetes mellitus (HCC)    Past Surgical History:  Procedure Laterality Date   APPENDECTOMY  2013   BILIARY STENT PLACEMENT N/A 05/02/2018   Procedure: BILIARY STENT PLACEMENT;  Surgeon: Sherrilyn Rist, MD;  Location: [facility];  Service: Gastroenterology;  Laterality: N/A;   BOWEL RESECTION  12/03/2020   Procedure: SMALL BOWEL RESECTION;  Surgeon: Sheliah Hatch, De Blanch, MD;  Location: [facility] OR;  Service: General;;   BRONCHIAL NEEDLE ASPIRATION BIOPSY  08/10/2023   Procedure: BRONCHIAL NEEDLE ASPIRATION BIOPSIES;  Surgeon: Josephine Igo, DO;  Location: [facility];  Service: Cardiopulmonary;;   CARPAL TUNNEL RELEASE     CESAREAN SECTION  2000; 2007; 2011   CHOLECYSTECTOMY N/A 04/03/2018   Procedure: LAPAROSCOPIC CHOLECYSTECTOMY;  Surgeon: Emelia Loron, MD;  Location: [facility] OR;  Service: General;   Laterality: N/A;   DILATATION & CURETTAGE/HYSTEROSCOPY WITH MYOSURE N/A 10/22/2020   Procedure: DILATATION & CURETTAGE/HYSTEROSCOPY WITH MYOSURE;  Surgeon: Lavina Hamman, MD;  Location: [facility] OR;  Service: Gynecology;  Laterality: N/A;   DILATION AND CURETTAGE OF UTERUS     ERCP N/A 05/02/2018   Procedure: ENDOSCOPIC RETROGRADE CHOLANGIOPANCREATOGRAPHY (ERCP);  Surgeon: Sherrilyn Rist, MD;  Location: [facility];  Service: Gastroenterology;  Laterality: N/A;   ERCP N/A 07/26/2018   Procedure: ENDOSCOPIC RETROGRADE CHOLANGIOPANCREATOGRAPHY (ERCP);  Surgeon: Hilarie Fredrickson, MD;  Location: [facility];  Service: Endoscopy;  Laterality: N/A;  stent removal   IR RADIOLOGIST EVAL & MGMT  05/19/2018   LAMINECTOMY N/A 12/13/2014   Procedure: CERVICAL LAMINECTOMY FOR INTRADURAL TUMOR;  Surgeon: Temple Pacini, MD;  Location: [facility];  Service: Neurosurgery;  Laterality: N/A;  posterior   LAPAROSCOPY N/A 07/20/2017   Procedure: LAPAROSCOPY OPERATIVE WITH WEDGE RESECTION RIGHT CORNUA AND PARTIAL SALPINGECTOMY;  Surgeon: Reva Bores, MD;  Location: [facility];  Service: Gynecology;  Laterality: N/A;   REDUCTION MAMMAPLASTY Bilateral 1998   REMOVAL OF STONES  05/02/2018   Procedure: REMOVAL OF STONES;  Surgeon: Sherrilyn Rist, MD;  Location: [facility];  Service: Gastroenterology;;   Dennison Mascot  05/02/2018   Procedure: SPHINCTEROTOMY;  Surgeon: Sherrilyn Rist, MD;  Location: [facility];  Service: Gastroenterology;;   Francine Graven REMOVAL  07/26/2018   Procedure: STENT REMOVAL;  Surgeon: Hilarie Fredrickson, MD;  Location: [facility];  Service: Endoscopy;;   TOTAL LAPAROSCOPIC HYSTERECTOMY WITH SALPINGECTOMY Bilateral 12/03/2020   Procedure: ATTEMPTED  LAPAROSCOPIC, OPEN ABDOMINAL HYSTERECTOMY WITH BILATERAL SALPINGO-OOPHERECTOMY AND LYSIS OF ADHESIONS;  Surgeon: Lavina Hamman, MD;  Location: [facility];  Service: Gynecology;  Laterality: Bilateral;   VIDEO BRONCHOSCOPY WITH ENDOBRONCHIAL ULTRASOUND Bilateral  08/10/2023   Procedure: VIDEO BRONCHOSCOPY WITH ENDOBRONCHIAL ULTRASOUND;  Surgeon: Josephine Igo, DO;  Location: [facility];  Service: Cardiopulmonary;  Laterality: Bilateral;   Patient Active Problem List   Diagnosis Date Noted   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   Obesity, Class III, BMI 40-49.9 (morbid obesity) (HCC) 07/14/2022   Hypokalemia 07/14/2022   UTI (urinary tract infection) 07/14/2022   AKI (acute kidney injury) (HCC) 07/09/2022   Acute low back pain 07/09/2022   Uterine leiomyoma 12/03/2020   S/P abdominal hysterectomy 12/03/2020   Bile leak, postoperative    Choledocholithiasis    Intra-abdominal fluid collection    Anemia 04/28/2018   Morbid obesity with body mass index (BMI) of 50.0 to 59.9 in adult [facility]) 04/02/2018   Mass of uterus 04/02/2018   Transaminitis 04/02/2018   Acute cholecystitis s/p lap cholecystectomy 04/03/2018 04/01/2018   Pregnancy, ectopic, cornual or cervical 07/21/2017   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   Neurogenic bladder 08/23/2015   Bilateral carpal tunnel syndrome 08/23/2015   Paraparesis of both lower limbs (HCC) 07/14/2015   Ileus, postoperative (HCC) 12/25/2014   Type 2 diabetes mellitus with obesity (HCC) 12/25/2014   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HCC) 12/24/2014   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   Ileus of unspecified type (HCC)    Atelectasis    Bowel obstruction (HCC)    Encounter for central line care    Ileus [facility])    Intestinal occlusion (HCC)    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   Headache 11/19/2014   Neck pain 11/19/2014   Right sided weakness 11/19/2014   Paresthesias 11/19/2014   Nightmares 09/11/2014   Essential hypertension 03/28/2014   Nephrolithiasis 03/27/2014   Type 2 diabetes mellitus without complication, with long-term current use of insulin (HCC) 03/27/2014   Abdominal pain 03/27/2014    PCP: Earlie Lou, MD  REFERRING PROVIDER: Danae Chen, MD  REFERRING DIAG: M54.2  (ICD-10-CM) - Cervicalgia   THERAPY DIAG:  Muscle weakness (generalized) - Plan: PT plan of care cert/re-cert  Abnormal posture - Plan: PT plan of care cert/re-cert  Other lack of coordination - Plan: PT plan of care cert/re-cert  Difficulty in walking, not elsewhere classified - Plan: PT plan of care cert/re-cert  Unsteadiness on feet - Plan: PT plan of care cert/re-cert  History of falling - Plan: PT plan of care cert/re-cert  Rationale for Evaluation and Treatment: Rehabilitation  ONSET DATE: 01/06/24 referral  SUBJECTIVE:  SUBJECTIVE STATEMENT: Patient arrives to clinic alone, no AD.  She denies recent falls or acute changes but endorses ongoing pain. Hand dominance: Right  PERTINENT HISTORY:  Arthritis, depression, HTN, paralysis, schizophrenia, DMII  PAIN:  Are you having pain? Yes: NPRS scale: 10/10 Pain location: from origin of incision to mid/low back all the way down BLE Pain description: burning, stabbing, aching Aggravating factors: none reported Relieving factors: none reported  PRECAUTIONS: Fall  RED FLAGS: Cervical red flags: Dysphagia No, Dysmetria No, Diplopia No, Nystagmus No, and Nausea No, Bowel or bladder incontinence: No, Spinal tumors: Yes: h/o, and Cauda equina syndrome: No     WEIGHT BEARING RESTRICTIONS: No  FALLS:  Has patient fallen in last 6 months? No  LIVING ENVIRONMENT: Lives with: lives with their family Lives in: House/apartment Stairs: No Has following equipment at home: Environmental consultant - 4 wheeled, Wheelchair (power), and Grab bars  PLOF: Needs assistance with ADLs and Needs assistance with homemaking  PATIENT GOALS: "I don't know what else to do"  OBJECTIVE:  Note: Objective measures were completed at Evaluation unless otherwise  noted.  DIAGNOSTIC FINDINGS:  12/08/23 c-spine MRI IMPRESSION: 1. Postsurgical changes involving the posterior cord at the level of C4, stable from previous. No findings to suggest recurrent tumor on this noncontrast examination. 2. Mild for age cervical spondylosis as above without significant stenosis or neural impingement.  PATIENT SURVEYS:  NDI 76%  COGNITION: Overall cognitive status: Within functional limits for tasks assessed  SENSATION: Light touch: Impaired  Hot/Cold: Impaired  Proprioception: Impaired   POSTURE: rounded shoulders and forward head  PALPATION: No trigger points, increased tension around c-spine incision   CERVICAL ROM:  ~50% limited by pain, most pain with R cervical rotation  UPPER EXTREMITY ROM: Guarded and painful  UPPER EXTREMITY MMT: Limited by pain  CERVICAL SPECIAL TESTS:  Spurling's test: Positive, Distraction test: Negative, and Sharp pursor's test: Negative  TREATMENT -Verbally reviewed HEP, confirmed pt has current printout and no questions at this time. -NDI:  68% = mod-high perceived disability -5xSTS:  24.56 sec w/ BUE support, pt requires rest following due to increased pain response in tall upright posture - :  254 ft SBA (seated rest)+ 199 (453 ft total)  PATIENT EDUCATION:  Education details: Discussed rollator use and she feels this is contributing to her pain as it cannot raise high enough - she would like an upright walker if her insurance will pay for it (reports her rollator was purchased over 5 years ago).  Continue land HEP, goal of aquatic HEP, re-cert with condensing POC to single land/aquatic therapist for improved completion of POC.  Aquatic expectations, map to [facility] facility, and community pool list. Person educated: Patient Education method: Explanation Education comprehension: verbalized understanding and needs further education  HOME EXERCISE PROGRAM: Access Code: [email or web address] URL:  [email or web address] Date: 02/04/2024 Prepared by: Camille Bal  Exercises - Prone Press Up On Elbows  - 1 x daily - 5 x weekly - 2 sets - 10 reps - Supine Bridge  - 1 x daily - 5 x weekly - 2 sets - 10 reps - Supine Cervical Retraction with Towel  - 1 x daily - 5 x weekly - 2 sets - 12 reps - 1-2 seconds hold - Supine Chin Tuck  - 1 x daily - 5 x weekly - 2 sets - 10 reps - Scapular retraction with resistance  - 1 x daily - 5 x weekly - 3 sets - 10 reps  ASSESSMENT:  CLINICAL IMPRESSION: Re-cert  completed today following goal assessment with 5xSTS and added.  Both assessments show increased fall risk and poor movement tolerance and mechanics.  Patient continues to benefit from skilled PT in land and aquatic settings as she has need for general pain management as well as possible need for different AD than she currently has.  Will plan to assess AD needs and establish aquatic HEP.  OBJECTIVE IMPAIRMENTS: Abnormal gait, decreased activity tolerance, decreased knowledge of condition, decreased mobility, decreased strength, hypomobility, increased fascial restrictions, impaired sensation, impaired UE functional use, improper body mechanics, postural dysfunction, and pain.   ACTIVITY LIMITATIONS: carrying, lifting, dressing, reach over head, hygiene/grooming, locomotion level, and caring for others  PARTICIPATION LIMITATIONS: meal prep, cleaning, driving, shopping, community activity, and occupation  PERSONAL FACTORS: Behavior pattern, Fitness, Past/current experiences, Time since onset of injury/illness/exacerbation, Transportation, and 3+ comorbidities: see above  are also affecting patient's functional outcome.   REHAB POTENTIAL: Fair chronicity, unknown etiology  CLINICAL DECISION MAKING: Stable/uncomplicated  EVALUATION COMPLEXITY: Moderate   GOALS: Goals reviewed with patient? Yes  SHORT TERM GOALS: Target date: 02/11/24  Pt will be independent with  initial HEP for improved pain relief and posture  Baseline:  to be provided Goal status: INITIAL  2.  Patient will improve NDI score to </= 65% to demonstrate a reduction in pain impacting mobility  Baseline: 76% Goal status: INITIAL    LONG TERM GOALS: Target date: 03/10/24  Pt will be independent with final HEP for improved pain relief and mobility   Baseline: to be provided; Established initial land HEP (3/21) Goal status: IN PROGRESS  2.  Patient will improve NDI score to </= 55% to demonstrate a reduction in pain and improved mobility Baseline: 76%; 68% (3/21) Goal status: INITIAL  GOALS (re-cert 4/09): Goals reviewed with patient? Yes  SHORT TERM GOALS = LONG TERM GOALS: Target date: 03/17/24  Pt will be independent with finalized land and aquatic HEP for improved pain relief and mobility.  Baseline: Established initial land HEP (3/21) Goal status: IN PROGRESS  2.  Patient will improve NDI score to </= 55% to demonstrate a reduction in pain and improved mobility. Baseline: 76%; 68% (3/21) Goal status: IN PROGRESS  3.  Pt will decrease 5xSTS to </=19.56 seconds in order to demonstrate decreased risk for falls and improved functional bilateral LE strength and power. Baseline: 24.56 sec w/ BUE support (3/21) Goal status: INITIAL  4.  Pt will ambulate >/=553 feet on to demonstrate improved functional endurance for home and community participation. Baseline: 453 ft no AD (3/21) Goal status: INITIAL  PLAN:  PT FREQUENCY: 1x/week + 2x/wk  PT DURATION: 6 weeks + 4 wks (re-cert 8/11)  PLANNED INTERVENTIONS: 91478- PT Re-evaluation, 97110-Therapeutic exercises, 97530- Therapeutic activity, 97112- Neuromuscular re-education, 97535- Self Care, 29562- Manual therapy, [phone]- Gait training, [phone]- Orthotic Fit/training, [phone]- Canalith repositioning, [id number]- Aquatic Therapy, [phone]- Electrical stimulation (unattended), [phone]- Electrical stimulation (manual), Patient/Family  education, Balance training, Taping, Dry Needling, Joint mobilization, Spinal mobilization, Scar mobilization, Vestibular training, Visual/preceptual remediation/compensation, DME instructions, Cryotherapy, and Moist heat  PLAN FOR NEXT SESSION: HEP modifications - balance, stretching, posture training; SciFit, upright rollator/walker? - place order request if needed  Aquatic HEP:  balance, general mobility, pain/stiffness management with stretching program, shoulder/periscapular strength   Sadie Haber, PT, DPT  02/18/2024, 3:54 PM

## 2024-02-22 ENCOUNTER — Ambulatory Visit: Payer: 59 | Admitting: Rehabilitation

## 2024-02-24 ENCOUNTER — Encounter: Payer: Self-pay | Admitting: Physical Therapy

## 2024-02-24 ENCOUNTER — Ambulatory Visit: Admitting: Physical Therapy

## 2024-02-24 DIAGNOSIS — R293 Abnormal posture: Secondary | ICD-10-CM

## 2024-02-24 DIAGNOSIS — R2681 Unsteadiness on feet: Secondary | ICD-10-CM

## 2024-02-24 DIAGNOSIS — M6281 Muscle weakness (generalized): Secondary | ICD-10-CM

## 2024-02-24 DIAGNOSIS — R278 Other lack of coordination: Secondary | ICD-10-CM

## 2024-02-24 DIAGNOSIS — Z9181 History of falling: Secondary | ICD-10-CM

## 2024-02-24 DIAGNOSIS — R262 Difficulty in walking, not elsewhere classified: Secondary | ICD-10-CM

## 2024-02-24 NOTE — Therapy (Signed)
 OUTPATIENT PHYSICAL THERAPY CERVICAL TREATMENT/AQUATIC NOTE   Patient Name: Cynthia Rosales MRN: [id number] DOB:06-18-1981, 43 y.o., female Today's Date: 02/24/2024  END OF SESSION:  PT End of Session - 02/24/24 1357     Visit Number 4    Number of Visits 15   7 + 8   Date for PT Re-Evaluation 95/62/13   re-cert to allow for aquatic scheduling   Authorization Type [employer] dual/medicaid    PT Start Time 1145    PT Stop Time 1230    PT Time Calculation (min) 45 min    Equipment Utilized During Treatment Gait belt    Activity Tolerance Patient tolerated treatment well    Behavior During Therapy WFL for tasks assessed/performed             Past Medical History:  Diagnosis Date   Abscess of bursa, left elbow 06/2013   Treated with I and D/antibiotics.    Arthritis    Asthma    Depression    History of blood transfusion    "after I had one of my kids"   History of kidney stones    Hypertension    Ovarian cyst    Paralysis (HCC)    "no feeling from the waist down"   Schizophrenia (HCC)    Sickle cell disease ruled out    Patient with no evidence of Hgb S on eletrophoresis x3   Type II diabetes mellitus (HCC)    Past Surgical History:  Procedure Laterality Date   APPENDECTOMY  2013   BILIARY STENT PLACEMENT N/A 05/02/2018   Procedure: BILIARY STENT PLACEMENT;  Surgeon: Sherrilyn Rist, MD;  Location: [facility];  Service: Gastroenterology;  Laterality: N/A;   BOWEL RESECTION  12/03/2020   Procedure: SMALL BOWEL RESECTION;  Surgeon: Sheliah Hatch, De Blanch, MD;  Location: [facility] OR;  Service: General;;   BRONCHIAL NEEDLE ASPIRATION BIOPSY  08/10/2023   Procedure: BRONCHIAL NEEDLE ASPIRATION BIOPSIES;  Surgeon: Josephine Igo, DO;  Location: [facility];  Service: Cardiopulmonary;;   CARPAL TUNNEL RELEASE     CESAREAN SECTION  2000; 2007; 2011   CHOLECYSTECTOMY N/A 04/03/2018   Procedure: LAPAROSCOPIC CHOLECYSTECTOMY;  Surgeon: Emelia Loron, MD;  Location: [facility] OR;   Service: General;  Laterality: N/A;   DILATATION & CURETTAGE/HYSTEROSCOPY WITH MYOSURE N/A 10/22/2020   Procedure: DILATATION & CURETTAGE/HYSTEROSCOPY WITH MYOSURE;  Surgeon: Lavina Hamman, MD;  Location: [facility] OR;  Service: Gynecology;  Laterality: N/A;   DILATION AND CURETTAGE OF UTERUS     ERCP N/A 05/02/2018   Procedure: ENDOSCOPIC RETROGRADE CHOLANGIOPANCREATOGRAPHY (ERCP);  Surgeon: Sherrilyn Rist, MD;  Location: [facility];  Service: Gastroenterology;  Laterality: N/A;   ERCP N/A 07/26/2018   Procedure: ENDOSCOPIC RETROGRADE CHOLANGIOPANCREATOGRAPHY (ERCP);  Surgeon: Hilarie Fredrickson, MD;  Location: [facility];  Service: Endoscopy;  Laterality: N/A;  stent removal   IR RADIOLOGIST EVAL & MGMT  05/19/2018   LAMINECTOMY N/A 12/13/2014   Procedure: CERVICAL LAMINECTOMY FOR INTRADURAL TUMOR;  Surgeon: Temple Pacini, MD;  Location: [facility];  Service: Neurosurgery;  Laterality: N/A;  posterior   LAPAROSCOPY N/A 07/20/2017   Procedure: LAPAROSCOPY OPERATIVE WITH WEDGE RESECTION RIGHT CORNUA AND PARTIAL SALPINGECTOMY;  Surgeon: Reva Bores, MD;  Location: [facility];  Service: Gynecology;  Laterality: N/A;   REDUCTION MAMMAPLASTY Bilateral 1998   REMOVAL OF STONES  05/02/2018   Procedure: REMOVAL OF STONES;  Surgeon: Sherrilyn Rist, MD;  Location: [facility];  Service: Gastroenterology;;   Dennison Mascot  05/02/2018   Procedure: SPHINCTEROTOMY;  Surgeon: Sherrilyn Rist, MD;  Location: [facility];  Service: Gastroenterology;;   Francine Graven REMOVAL  07/26/2018   Procedure: STENT REMOVAL;  Surgeon: Hilarie Fredrickson, MD;  Location: [facility];  Service: Endoscopy;;   TOTAL LAPAROSCOPIC HYSTERECTOMY WITH SALPINGECTOMY Bilateral 12/03/2020   Procedure: ATTEMPTED  LAPAROSCOPIC, OPEN ABDOMINAL HYSTERECTOMY WITH BILATERAL SALPINGO-OOPHERECTOMY AND LYSIS OF ADHESIONS;  Surgeon: Lavina Hamman, MD;  Location: [facility] OR;  Service: Gynecology;  Laterality: Bilateral;   VIDEO BRONCHOSCOPY WITH ENDOBRONCHIAL  ULTRASOUND Bilateral 08/10/2023   Procedure: VIDEO BRONCHOSCOPY WITH ENDOBRONCHIAL ULTRASOUND;  Surgeon: Josephine Igo, DO;  Location: [facility];  Service: Cardiopulmonary;  Laterality: Bilateral;   Patient Active Problem List   Diagnosis Date Noted   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   Obesity, Class III, BMI 40-49.9 (morbid obesity) (HCC) 07/14/2022   Hypokalemia 07/14/2022   UTI (urinary tract infection) 07/14/2022   AKI (acute kidney injury) (HCC) 07/09/2022   Acute low back pain 07/09/2022   Uterine leiomyoma 12/03/2020   S/P abdominal hysterectomy 12/03/2020   Bile leak, postoperative    Choledocholithiasis    Intra-abdominal fluid collection    Anemia 04/28/2018   Morbid obesity with body mass index (BMI) of 50.0 to 59.9 in adult [facility]) 04/02/2018   Mass of uterus 04/02/2018   Transaminitis 04/02/2018   Acute cholecystitis s/p lap cholecystectomy 04/03/2018 04/01/2018   Pregnancy, ectopic, cornual or cervical 07/21/2017   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   Neurogenic bladder 08/23/2015   Bilateral carpal tunnel syndrome 08/23/2015   Paraparesis of both lower limbs (HCC) 07/14/2015   Ileus, postoperative (HCC) 12/25/2014   Type 2 diabetes mellitus with obesity (HCC) 12/25/2014   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HCC) 12/24/2014   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   Ileus of unspecified type (HCC)    Atelectasis    Bowel obstruction (HCC)    Encounter for central line care    [facility])    Intestinal occlusion (HCC)    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   Headache 11/19/2014   Neck pain 11/19/2014   Right sided weakness 11/19/2014   Paresthesias 11/19/2014   Nightmares 09/11/2014   Essential hypertension 03/28/2014   Nephrolithiasis 03/27/2014   Type 2 diabetes mellitus without complication, with long-term current use of insulin (HCC) 03/27/2014   Abdominal pain 03/27/2014    PCP: Earlie Lou, MD  REFERRING PROVIDER: Danae Chen,  MD  REFERRING DIAG: M54.2 (ICD-10-CM) - Cervicalgia   THERAPY DIAG:  Muscle weakness (generalized)  Abnormal posture  Other lack of coordination  Difficulty in walking, not elsewhere classified  Unsteadiness on feet  History of falling  Rationale for Evaluation and Treatment: Rehabilitation  ONSET DATE: 01/06/24 referral  SUBJECTIVE:  SUBJECTIVE STATEMENT: Patient arrives to [facility] alone, no AD.  She denies recent falls or acute changes but endorses ongoing pain.  She is excited to try the pool today.  She states the hot tub helped loosen her up before her appt today. Hand dominance: Right  PERTINENT HISTORY:  Arthritis, depression, HTN, paralysis, schizophrenia, DMII  PAIN:  Are you having pain? Yes: NPRS scale: 10/10 Pain location: from origin of incision to mid/low back all the way down BLE Pain description: burning, stabbing, aching Aggravating factors: none reported Relieving factors: none reported  PRECAUTIONS: Fall  RED FLAGS: Cervical red flags: Dysphagia No, Dysmetria No, Diplopia No, Nystagmus No, and Nausea No, Bowel or bladder incontinence: No, Spinal tumors: Yes: h/o, and Cauda equina syndrome: No     WEIGHT BEARING RESTRICTIONS: No  FALLS:  Has patient fallen in last 6 months? No  LIVING ENVIRONMENT: Lives with: lives with their family Lives in: House/apartment Stairs: No Has following equipment at home: Environmental consultant - 4 wheeled, Wheelchair (power), and Grab bars  PLOF: Needs assistance with ADLs and Needs assistance with homemaking  PATIENT GOALS: "I don't know what else to do"  OBJECTIVE:  Note: Objective measures were completed at Evaluation unless otherwise noted.  DIAGNOSTIC FINDINGS:  12/08/23 c-spine MRI IMPRESSION: 1. Postsurgical changes involving  the posterior cord at the level of C4, stable from previous. No findings to suggest recurrent tumor on this noncontrast examination. 2. Mild for age cervical spondylosis as above without significant stenosis or neural impingement.  PATIENT SURVEYS:  NDI 76%  COGNITION: Overall cognitive status: Within functional limits for tasks assessed  SENSATION: Light touch: Impaired  Hot/Cold: Impaired  Proprioception: Impaired   POSTURE: rounded shoulders and forward head  PALPATION: No trigger points, increased tension around c-spine incision   CERVICAL ROM:  ~50% limited by pain, most pain with R cervical rotation  UPPER EXTREMITY ROM: Guarded and painful  UPPER EXTREMITY MMT: Limited by pain  CERVICAL SPECIAL TESTS:  Spurling's test: Positive, Distraction test: Negative, and Sharp pursor's test: Negative  TREATMENT Aquatic therapy at [facility] - pool temperature approximately 92 degrees   Patient seen for aquatic therapy today.  Treatment took place in water 3.6-4.8 feet deep depending upon activity.  Patient entered and exited the pool via stairs using reciprocal gait and bilateral rails at SBA level.   Exercises: Water walking warmup:  forward > retro > laterally 6x18 ft each w/ bilateral yellow supportive dumbbells Supported standing yellow DB shoulder flexion > abduction > chest press 3x10 each direction, cued for form Plank kickbacks 3x20, cued for core engagement and to prevent lumbar hyperextension Standing 3-way hip x10 each LE each direction Supported standing hamstring stretch using yellow noodle x2 minutes midline hold each LE Standing heel-toe raises 2x2 minutes STS 3x10 unsupported 6" forward step ups using BUE support x20 alternating leading LE  Patient requires buoyancy of the water for support for reduced fall risk with gait training and balance exercises and general chronic pain management with SBA support. Exercises able to be performed safely in water  without the risk of fall compared to those same exercises performed on land; viscosity of water needed for resistance for strengthening. Current of water provides perturbations for challenging static and dynamic balance.    PATIENT EDUCATION:  Education details: Aquatic HEP to be established at future session. Person educated: Patient Education method: Explanation Education comprehension: verbalized understanding and needs further education  HOME EXERCISE PROGRAM: Access Code: [email or web address] URL: [email or web address] Date: 02/04/2024 Prepared by: Ashok Cordia  Pollie Poma  Exercises - Prone Press Up On Elbows  - 1 x daily - 5 x weekly - 2 sets - 10 reps - Supine Bridge  - 1 x daily - 5 x weekly - 2 sets - 10 reps - Supine Cervical Retraction with Towel  - 1 x daily - 5 x weekly - 2 sets - 12 reps - 1-2 seconds hold - Supine Chin Tuck  - 1 x daily - 5 x weekly - 2 sets - 10 reps - Scapular retraction with resistance  - 1 x daily - 5 x weekly - 3 sets - 10 reps  ASSESSMENT:  CLINICAL IMPRESSION: Patient reports good tolerance in warm pool setting but effect noted on pain rating, 10/10 at onset and end of session.  She is able to move with better fluidity in this setting, but has generally reduced strength and reports some instances of troubled balance without overt deficit in this setting.  PT to continue in aquatic setting to improve overall tolerance and promote long-term movement as sedentary lifestyle due to chronic pain is primary limiting factor for patient at current.  Continue per POC.  OBJECTIVE IMPAIRMENTS: Abnormal gait, decreased activity tolerance, decreased knowledge of condition, decreased mobility, decreased strength, hypomobility, increased fascial restrictions, impaired sensation, impaired UE functional use, improper body mechanics, postural dysfunction, and pain.   ACTIVITY LIMITATIONS: carrying, lifting, dressing, reach over head, hygiene/grooming, locomotion level,  and caring for others  PARTICIPATION LIMITATIONS: meal prep, cleaning, driving, shopping, community activity, and occupation  PERSONAL FACTORS: Behavior pattern, Fitness, Past/current experiences, Time since onset of injury/illness/exacerbation, Transportation, and 3+ comorbidities: see above  are also affecting patient's functional outcome.   REHAB POTENTIAL: Fair chronicity, unknown etiology  CLINICAL DECISION MAKING: Stable/uncomplicated  EVALUATION COMPLEXITY: Moderate   GOALS: Goals reviewed with patient? Yes  SHORT TERM GOALS: Target date: 02/11/24  Pt will be independent with initial HEP for improved pain relief and posture  Baseline:  to be provided Goal status: INITIAL  2.  Patient will improve NDI score to </= 65% to demonstrate a reduction in pain impacting mobility  Baseline: 76% Goal status: INITIAL    LONG TERM GOALS: Target date: 03/10/24  Pt will be independent with final HEP for improved pain relief and mobility   Baseline: to be provided; Established initial land HEP (3/21) Goal status: IN PROGRESS  2.  Patient will improve NDI score to </= 55% to demonstrate a reduction in pain and improved mobility Baseline: 76%; 68% (3/21) Goal status: INITIAL  GOALS (re-cert 1/30): Goals reviewed with patient? Yes  SHORT TERM GOALS = LONG TERM GOALS: Target date: 03/17/24  Pt will be independent with finalized land and aquatic HEP for improved pain relief and mobility.  Baseline: Established initial land HEP (3/21) Goal status: IN PROGRESS  2.  Patient will improve NDI score to </= 55% to demonstrate a reduction in pain and improved mobility. Baseline: 76%; 68% (3/21) Goal status: IN PROGRESS  3.  Pt will decrease 5xSTS to </=19.56 seconds in order to demonstrate decreased risk for falls and improved functional bilateral LE strength and power. Baseline: 24.56 sec w/ BUE support (3/21) Goal status: INITIAL  4.  Pt will ambulate >/=553 feet on to  demonstrate improved functional endurance for home and community participation. Baseline: 453 ft no AD (3/21) Goal status: INITIAL  PLAN:  PT FREQUENCY: 1x/week + 2x/wk  PT DURATION: 6 weeks + 4 wks (re-cert 8/65)  PLANNED INTERVENTIONS: 78469- PT Re-evaluation, 97110-Therapeutic exercises, 97530- Therapeutic  activity, [id number]- Neuromuscular re-education, [phone]- Self Care, 13086- Manual therapy, [phone]- Gait training, [phone]- Orthotic Fit/training, [phone]- Canalith repositioning, [id number]- Aquatic Therapy, [phone]- Electrical stimulation (unattended), [phone]- Electrical stimulation (manual), Patient/Family education, Balance training, Taping, Dry Needling, Joint mobilization, Spinal mobilization, Scar mobilization, Vestibular training, Visual/preceptual remediation/compensation, DME instructions, Cryotherapy, and Moist heat  PLAN FOR NEXT SESSION: HEP modifications - balance, stretching, posture training; SciFit, upright rollator/walker? - place order request if needed  Aquatic HEP:  balance, general mobility, pain/stiffness management with stretching program, shoulder/periscapular strength; establish aquatic HEP!   Sadie Haber, PT, DPT  02/24/2024, 1:58 PM

## 2024-02-25 ENCOUNTER — Ambulatory Visit: Admitting: Physical Therapy

## 2024-02-25 ENCOUNTER — Encounter: Payer: Self-pay | Admitting: Physical Therapy

## 2024-02-25 DIAGNOSIS — Z9181 History of falling: Secondary | ICD-10-CM

## 2024-02-25 DIAGNOSIS — M6281 Muscle weakness (generalized): Secondary | ICD-10-CM | POA: Diagnosis not present

## 2024-02-25 DIAGNOSIS — R2681 Unsteadiness on feet: Secondary | ICD-10-CM

## 2024-02-25 DIAGNOSIS — R278 Other lack of coordination: Secondary | ICD-10-CM

## 2024-02-25 DIAGNOSIS — R262 Difficulty in walking, not elsewhere classified: Secondary | ICD-10-CM

## 2024-02-25 DIAGNOSIS — R293 Abnormal posture: Secondary | ICD-10-CM

## 2024-02-25 NOTE — Therapy (Signed)
 OUTPATIENT PHYSICAL THERAPY CERVICAL TREATMENT   Patient Name: Cynthia Rosales MRN: [id number] DOB:13-Sep-1981, 43 y.o., female Today's Date: 02/25/2024  END OF SESSION:  PT End of Session - 02/25/24 1543     Visit Number 5    Number of Visits 15   7 + 8   Date for PT Re-Evaluation 40/98/11   re-cert to allow for aquatic scheduling   Authorization Type [employer] dual/medicaid    PT Start Time 1539   pt arrived late to session   PT Stop Time 1620    PT Time Calculation (min) 41 min    Equipment Utilized During Treatment Gait belt    Activity Tolerance Patient tolerated treatment well    Behavior During Therapy WFL for tasks assessed/performed             Past Medical History:  Diagnosis Date   Abscess of bursa, left elbow 06/2013   Treated with I and D/antibiotics.    Arthritis    Asthma    Depression    History of blood transfusion    "after I had one of my kids"   History of kidney stones    Hypertension    Ovarian cyst    Paralysis (HCC)    "no feeling from the waist down"   Schizophrenia (HCC)    Sickle cell disease ruled out    Patient with no evidence of Hgb S on eletrophoresis x3   Type II diabetes mellitus (HCC)    Past Surgical History:  Procedure Laterality Date   APPENDECTOMY  2013   BILIARY STENT PLACEMENT N/A 05/02/2018   Procedure: BILIARY STENT PLACEMENT;  Surgeon: Sherrilyn Rist, MD;  Location: [facility];  Service: Gastroenterology;  Laterality: N/A;   BOWEL RESECTION  12/03/2020   Procedure: SMALL BOWEL RESECTION;  Surgeon: Sheliah Hatch, De Blanch, MD;  Location: [facility] OR;  Service: General;;   BRONCHIAL NEEDLE ASPIRATION BIOPSY  08/10/2023   Procedure: BRONCHIAL NEEDLE ASPIRATION BIOPSIES;  Surgeon: Josephine Igo, DO;  Location: [facility];  Service: Cardiopulmonary;;   CARPAL TUNNEL RELEASE     CESAREAN SECTION  2000; 2007; 2011   CHOLECYSTECTOMY N/A 04/03/2018   Procedure: LAPAROSCOPIC CHOLECYSTECTOMY;  Surgeon: Emelia Loron, MD;  Location:  [facility] OR;  Service: General;  Laterality: N/A;   DILATATION & CURETTAGE/HYSTEROSCOPY WITH MYOSURE N/A 10/22/2020   Procedure: DILATATION & CURETTAGE/HYSTEROSCOPY WITH MYOSURE;  Surgeon: Lavina Hamman, MD;  Location: [facility] OR;  Service: Gynecology;  Laterality: N/A;   DILATION AND CURETTAGE OF UTERUS     ERCP N/A 05/02/2018   Procedure: ENDOSCOPIC RETROGRADE CHOLANGIOPANCREATOGRAPHY (ERCP);  Surgeon: Sherrilyn Rist, MD;  Location: [facility];  Service: Gastroenterology;  Laterality: N/A;   ERCP N/A 07/26/2018   Procedure: ENDOSCOPIC RETROGRADE CHOLANGIOPANCREATOGRAPHY (ERCP);  Surgeon: Hilarie Fredrickson, MD;  Location: [facility];  Service: Endoscopy;  Laterality: N/A;  stent removal   IR RADIOLOGIST EVAL & MGMT  05/19/2018   LAMINECTOMY N/A 12/13/2014   Procedure: CERVICAL LAMINECTOMY FOR INTRADURAL TUMOR;  Surgeon: Temple Pacini, MD;  Location: [facility];  Service: Neurosurgery;  Laterality: N/A;  posterior   LAPAROSCOPY N/A 07/20/2017   Procedure: LAPAROSCOPY OPERATIVE WITH WEDGE RESECTION RIGHT CORNUA AND PARTIAL SALPINGECTOMY;  Surgeon: Reva Bores, MD;  Location: [facility]S;  Service: Gynecology;  Laterality: N/A;   REDUCTION MAMMAPLASTY Bilateral 1998   REMOVAL OF STONES  05/02/2018   Procedure: REMOVAL OF STONES;  Surgeon: Sherrilyn Rist, MD;  Location: [facility];  Service:  Gastroenterology;;   Dennison Mascot  05/02/2018   Procedure: SPHINCTEROTOMY;  Surgeon: Sherrilyn Rist, MD;  Location: [facility];  Service: Gastroenterology;;   Francine Graven REMOVAL  07/26/2018   Procedure: STENT REMOVAL;  Surgeon: Hilarie Fredrickson, MD;  Location: [facility];  Service: Endoscopy;;   TOTAL LAPAROSCOPIC HYSTERECTOMY WITH SALPINGECTOMY Bilateral 12/03/2020   Procedure: ATTEMPTED  LAPAROSCOPIC, OPEN ABDOMINAL HYSTERECTOMY WITH BILATERAL SALPINGO-OOPHERECTOMY AND LYSIS OF ADHESIONS;  Surgeon: Lavina Hamman, MD;  Location: [facility] OR;  Service: Gynecology;  Laterality: Bilateral;   VIDEO BRONCHOSCOPY WITH  ENDOBRONCHIAL ULTRASOUND Bilateral 08/10/2023   Procedure: VIDEO BRONCHOSCOPY WITH ENDOBRONCHIAL ULTRASOUND;  Surgeon: Josephine Igo, DO;  Location: [facility];  Service: Cardiopulmonary;  Laterality: Bilateral;   Patient Active Problem List   Diagnosis Date Noted   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   Obesity, Class III, BMI 40-49.9 (morbid obesity) (HCC) 07/14/2022   Hypokalemia 07/14/2022   UTI (urinary tract infection) 07/14/2022   AKI (acute kidney injury) (HCC) 07/09/2022   Acute low back pain 07/09/2022   Uterine leiomyoma 12/03/2020   S/P abdominal hysterectomy 12/03/2020   Bile leak, postoperative    Choledocholithiasis    Intra-abdominal fluid collection    Anemia 04/28/2018   Morbid obesity with body mass index (BMI) of 50.0 to 59.9 in adult [facility]) 04/02/2018   Mass of uterus 04/02/2018   Transaminitis 04/02/2018   Acute cholecystitis s/p lap cholecystectomy 04/03/2018 04/01/2018   Pregnancy, ectopic, cornual or cervical 07/21/2017   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   Neurogenic bladder 08/23/2015   Bilateral carpal tunnel syndrome 08/23/2015   Paraparesis of both lower limbs (HCC) 07/14/2015   Ileus, postoperative (HCC) 12/25/2014   Type 2 diabetes mellitus with obesity (HCC) 12/25/2014   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HCC) 12/24/2014   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   Ileus of unspecified type (HCC)    Atelectasis    Bowel obstruction (HCC)    Encounter for central line care    Ileus [facility])    Intestinal occlusion (HCC)    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   Headache 11/19/2014   Neck pain 11/19/2014   Right sided weakness 11/19/2014   Paresthesias 11/19/2014   Nightmares 09/11/2014   Essential hypertension 03/28/2014   Nephrolithiasis 03/27/2014   Type 2 diabetes mellitus without complication, with long-term current use of insulin (HCC) 03/27/2014   Abdominal pain 03/27/2014    PCP: Earlie Lou, MD  REFERRING PROVIDER: Danae Chen, MD  REFERRING DIAG: M54.2 (ICD-10-CM) - Cervicalgia   THERAPY DIAG:  Muscle weakness (generalized)  Abnormal posture  Other lack of coordination  Difficulty in walking, not elsewhere classified  Unsteadiness on feet  History of falling  Rationale for Evaluation and Treatment: Rehabilitation  ONSET DATE: 01/06/24 referral  SUBJECTIVE:  SUBJECTIVE STATEMENT: Patient arrives to clinic alone, no AD.  She reports she was unsure if she would make it here as she was hurting/sore after pool session yesterday.  She slept until 2pm today and then had to take a pain pill just to tolerate getting here.  She denies recent falls or acute changes.  Her pain is >10.   Hand dominance: Right  PERTINENT HISTORY:  Arthritis, depression, HTN, paralysis, schizophrenia, DMII  PAIN:  Are you having pain? Yes: NPRS scale: >10/10 Pain location: from origin of incision to mid/low back all the way down BLE Pain description: burning, stabbing, aching Aggravating factors: none reported Relieving factors: none reported  PRECAUTIONS: Fall  RED FLAGS: Cervical red flags: Dysphagia No, Dysmetria No, Diplopia No, Nystagmus No, and Nausea No, Bowel or bladder incontinence: No, Spinal tumors: Yes: h/o, and Cauda equina syndrome: No     WEIGHT BEARING RESTRICTIONS: No  FALLS:  Has patient fallen in last 6 months? No  LIVING ENVIRONMENT: Lives with: lives with their family Lives in: House/apartment Stairs: No Has following equipment at home: Environmental consultant - 4 wheeled, Wheelchair (power), and Grab bars  PLOF: Needs assistance with ADLs and Needs assistance with homemaking  PATIENT GOALS: "I don't know what else to do"  OBJECTIVE:  Note: Objective measures were completed at Evaluation unless otherwise  noted.  DIAGNOSTIC FINDINGS:  12/08/23 c-spine MRI IMPRESSION: 1. Postsurgical changes involving the posterior cord at the level of C4, stable from previous. No findings to suggest recurrent tumor on this noncontrast examination. 2. Mild for age cervical spondylosis as above without significant stenosis or neural impingement.  PATIENT SURVEYS:  NDI 76%  COGNITION: Overall cognitive status: Within functional limits for tasks assessed  SENSATION: Light touch: Impaired  Hot/Cold: Impaired  Proprioception: Impaired   POSTURE: rounded shoulders and forward head  PALPATION: No trigger points, increased tension around c-spine incision   CERVICAL ROM:  ~50% limited by pain, most pain with R cervical rotation  UPPER EXTREMITY ROM: Guarded and painful  UPPER EXTREMITY MMT: Limited by pain  CERVICAL SPECIAL TESTS:  Spurling's test: Positive, Distraction test: Negative, and Sharp pursor's test: Negative  TREATMENT -Arm bike x4 minutes forward at level 3.0 > 4 minutes backwards at level 3.0 Reviewed chair yoga handout:  -Breathing x10  -Cat/cow x10; return demo and cuing to improve thoracic initiation of movement w/ breathing  -Circles CW > CCW x10 each direction  -Sun Salutation w/ arms x10 w/ paced breathing  -Sun salutation w/ twist x10 each side, return demo for paced breathing  -High altar side leans x10 each side  -Assisted neck stretches x10 each side w/ 5 second holds -Forward fold x10 w/ paced breathing  -Trialed upright walker to assess for need/benefit vs current rollator.  See discussion below.  Pt able to ambulate 230 ft w/ each device using similar mechanics, no LOB.  She reports preferring the way that the upright walker holds her up vs the rollator not providing as much support.  Pt is not significantly forward leaned with rollator.  PATIENT EDUCATION:  Education details: Discussed AD needs - pt has rollator that she does not use for primary means of ambulation  - she typically goes without AD and just uses things like grocery cart in community as needed.  Discussed increasing level of restriction with upright walker vs current rollator and differences in strain placed on shoulders.  Edu on difficulty transporting upright vs rollator due to increased weight of device.  Discussed goal of  PT to address strength, general activity tolerance, and posture to improve her daily activity and continue to make ambulation easier vs increasing AD unless absolutely necessary.  She verbalizes understanding, but PT emphasized that we will revisit AD in future as needed. Person educated: Patient Education method: Explanation Education comprehension: verbalized understanding and needs further education  HOME EXERCISE PROGRAM: Access Code: [email or web address] URL: [email or web address] Date: 02/04/2024 Prepared by: Camille Bal  Exercises - Prone Press Up On Elbows  - 1 x daily - 5 x weekly - 2 sets - 10 reps - Supine Bridge  - 1 x daily - 5 x weekly - 2 sets - 10 reps - Supine Cervical Retraction with Towel  - 1 x daily - 5 x weekly - 2 sets - 12 reps - 1-2 seconds hold - Supine Chin Tuck  - 1 x daily - 5 x weekly - 2 sets - 10 reps - Scapular retraction with resistance  - 1 x daily - 5 x weekly - 3 sets - 10 reps  -Chair yoga - poses established on 3/28 visit  ASSESSMENT:  CLINICAL IMPRESSION: Made gentle chair yoga addition to patient's HEP this visit to promote general mobility and activity tolerance.  Trialed upright walker, but PT not recommending at this time as rollator continues to meet patient's needs when she chooses to use AD and no status change has occurred since receipt of rollator.  She prefers the upright posture of upright walker, but PT continues to target posture and improving sustained activity to prevent need for more restrictive device.  Will reassess need as pt progresses with PT services.  Continue per POC.  OBJECTIVE IMPAIRMENTS:  Abnormal gait, decreased activity tolerance, decreased knowledge of condition, decreased mobility, decreased strength, hypomobility, increased fascial restrictions, impaired sensation, impaired UE functional use, improper body mechanics, postural dysfunction, and pain.   ACTIVITY LIMITATIONS: carrying, lifting, dressing, reach over head, hygiene/grooming, locomotion level, and caring for others  PARTICIPATION LIMITATIONS: meal prep, cleaning, driving, shopping, community activity, and occupation  PERSONAL FACTORS: Behavior pattern, Fitness, Past/current experiences, Time since onset of injury/illness/exacerbation, Transportation, and 3+ comorbidities: see above  are also affecting patient's functional outcome.   REHAB POTENTIAL: Fair chronicity, unknown etiology  CLINICAL DECISION MAKING: Stable/uncomplicated  EVALUATION COMPLEXITY: Moderate   GOALS: Goals reviewed with patient? Yes  SHORT TERM GOALS: Target date: 02/11/24  Pt will be independent with initial HEP for improved pain relief and posture  Baseline:  to be provided Goal status: INITIAL  2.  Patient will improve NDI score to </= 65% to demonstrate a reduction in pain impacting mobility  Baseline: 76% Goal status: INITIAL    LONG TERM GOALS: Target date: 03/10/24  Pt will be independent with final HEP for improved pain relief and mobility   Baseline: to be provided; Established initial land HEP (3/21) Goal status: IN PROGRESS  2.  Patient will improve NDI score to </= 55% to demonstrate a reduction in pain and improved mobility Baseline: 76%; 68% (3/21) Goal status: INITIAL  GOALS (re-cert 8/41): Goals reviewed with patient? Yes  SHORT TERM GOALS = LONG TERM GOALS: Target date: 03/17/24  Pt will be independent with finalized land and aquatic HEP for improved pain relief and mobility.  Baseline: Established initial land HEP (3/21) Goal status: IN PROGRESS  2.  Patient will improve NDI score to </= 55% to  demonstrate a reduction in pain and improved mobility. Baseline: 76%; 68% (3/21) Goal status: IN PROGRESS  3.  Pt will decrease 5xSTS to </=  19.56 seconds in order to demonstrate decreased risk for falls and improved functional bilateral LE strength and power. Baseline: 24.56 sec w/ BUE support (3/21) Goal status: INITIAL  4.  Pt will ambulate >/=553 feet on to demonstrate improved functional endurance for home and community participation. Baseline: 453 ft no AD (3/21) Goal status: INITIAL  PLAN:  PT FREQUENCY: 1x/week + 2x/wk  PT DURATION: 6 weeks + 4 wks (re-cert 4/09)  PLANNED INTERVENTIONS: [id number]- PT Re-evaluation, 97110-Therapeutic exercises, 97530- Therapeutic activity, 97112- Neuromuscular re-education, 97535- Self Care, 47829- Manual therapy, [phone]- Gait training, [phone]- Orthotic Fit/training, [phone]- Canalith repositioning, [id number]- Aquatic Therapy, [phone]- Electrical stimulation (unattended), [phone]- Electrical stimulation (manual), Patient/Family education, Balance training, Taping, Dry Needling, Joint mobilization, Spinal mobilization, Scar mobilization, Vestibular training, Visual/preceptual remediation/compensation, DME instructions, Cryotherapy, and Moist heat  PLAN FOR NEXT SESSION: HEP modifications - balance, stretching, posture training; SciFit  Aquatic HEP:  balance, general mobility, pain/stiffness management with stretching program, shoulder/periscapular strength; establish aquatic HEP!   Sadie Haber, PT, DPT  02/25/2024, 4:54 PM

## 2024-03-02 ENCOUNTER — Ambulatory Visit: Attending: Family Medicine | Admitting: Physical Therapy

## 2024-03-02 DIAGNOSIS — R2681 Unsteadiness on feet: Secondary | ICD-10-CM | POA: Insufficient documentation

## 2024-03-02 DIAGNOSIS — R262 Difficulty in walking, not elsewhere classified: Secondary | ICD-10-CM | POA: Insufficient documentation

## 2024-03-02 DIAGNOSIS — R278 Other lack of coordination: Secondary | ICD-10-CM | POA: Insufficient documentation

## 2024-03-02 DIAGNOSIS — M6281 Muscle weakness (generalized): Secondary | ICD-10-CM | POA: Diagnosis present

## 2024-03-02 DIAGNOSIS — Z9181 History of falling: Secondary | ICD-10-CM | POA: Insufficient documentation

## 2024-03-02 DIAGNOSIS — R293 Abnormal posture: Secondary | ICD-10-CM | POA: Insufficient documentation

## 2024-03-03 ENCOUNTER — Ambulatory Visit: Admitting: Physical Therapy

## 2024-03-03 ENCOUNTER — Encounter: Payer: Self-pay | Admitting: Physical Therapy

## 2024-03-03 DIAGNOSIS — R278 Other lack of coordination: Secondary | ICD-10-CM

## 2024-03-03 DIAGNOSIS — R2681 Unsteadiness on feet: Secondary | ICD-10-CM

## 2024-03-03 DIAGNOSIS — M6281 Muscle weakness (generalized): Secondary | ICD-10-CM | POA: Diagnosis not present

## 2024-03-03 DIAGNOSIS — R262 Difficulty in walking, not elsewhere classified: Secondary | ICD-10-CM

## 2024-03-03 DIAGNOSIS — R293 Abnormal posture: Secondary | ICD-10-CM

## 2024-03-03 DIAGNOSIS — Z9181 History of falling: Secondary | ICD-10-CM

## 2024-03-03 NOTE — Therapy (Signed)
 OUTPATIENT PHYSICAL THERAPY CERVICAL TREATMENT   Patient Name: Cynthia Rosales MRN: [id number] DOB:1981-01-05, 43 y.o., female Today's Date: 03/03/2024  END OF SESSION:  PT End of Session - 03/03/24 1459     Visit Number 7    Number of Visits 15   7 + 8   Date for PT Re-Evaluation 40/98/11   re-cert to allow for aquatic scheduling   Authorization Type [employer] dual/medicaid    PT Start Time 1450    PT Stop Time 1530    PT Time Calculation (min) 40 min    Equipment Utilized During Treatment Gait belt    Activity Tolerance Patient tolerated treatment well    Behavior During Therapy WFL for tasks assessed/performed             Past Medical History:  Diagnosis Date   Abscess of bursa, left elbow 06/2013   Treated with I and D/antibiotics.    Arthritis    Asthma    Depression    History of blood transfusion    "after I had one of my kids"   History of kidney stones    Hypertension    Ovarian cyst    Paralysis (HCC)    "no feeling from the waist down"   Schizophrenia (HCC)    Sickle cell disease ruled out    Patient with no evidence of Hgb S on eletrophoresis x3   Type II diabetes mellitus (HCC)    Past Surgical History:  Procedure Laterality Date   APPENDECTOMY  2013   BILIARY STENT PLACEMENT N/A 05/02/2018   Procedure: BILIARY STENT PLACEMENT;  Surgeon: Sherrilyn Rist, MD;  Location: [facility];  Service: Gastroenterology;  Laterality: N/A;   BOWEL RESECTION  12/03/2020   Procedure: SMALL BOWEL RESECTION;  Surgeon: Sheliah Hatch, De Blanch, MD;  Location: [facility] OR;  Service: General;;   BRONCHIAL NEEDLE ASPIRATION BIOPSY  08/10/2023   Procedure: BRONCHIAL NEEDLE ASPIRATION BIOPSIES;  Surgeon: Josephine Igo, DO;  Location: [facility];  Service: Cardiopulmonary;;   CARPAL TUNNEL RELEASE     CESAREAN SECTION  2000; 2007; 2011   CHOLECYSTECTOMY N/A 04/03/2018   Procedure: LAPAROSCOPIC CHOLECYSTECTOMY;  Surgeon: Emelia Loron, MD;  Location: [facility] OR;  Service: General;   Laterality: N/A;   DILATATION & CURETTAGE/HYSTEROSCOPY WITH MYOSURE N/A 10/22/2020   Procedure: DILATATION & CURETTAGE/HYSTEROSCOPY WITH MYOSURE;  Surgeon: Lavina Hamman, MD;  Location: [facility] OR;  Service: Gynecology;  Laterality: N/A;   DILATION AND CURETTAGE OF UTERUS     ERCP N/A 05/02/2018   Procedure: ENDOSCOPIC RETROGRADE CHOLANGIOPANCREATOGRAPHY (ERCP);  Surgeon: Sherrilyn Rist, MD;  Location: [facility];  Service: Gastroenterology;  Laterality: N/A;   ERCP N/A 07/26/2018   Procedure: ENDOSCOPIC RETROGRADE CHOLANGIOPANCREATOGRAPHY (ERCP);  Surgeon: Hilarie Fredrickson, MD;  Location: [facility];  Service: Endoscopy;  Laterality: N/A;  stent removal   IR RADIOLOGIST EVAL & MGMT  05/19/2018   LAMINECTOMY N/A 12/13/2014   Procedure: CERVICAL LAMINECTOMY FOR INTRADURAL TUMOR;  Surgeon: Temple Pacini, MD;  Location: [facility];  Service: Neurosurgery;  Laterality: N/A;  posterior   LAPAROSCOPY N/A 07/20/2017   Procedure: LAPAROSCOPY OPERATIVE WITH WEDGE RESECTION RIGHT CORNUA AND PARTIAL SALPINGECTOMY;  Surgeon: Reva Bores, MD;  Location: [facility];  Service: Gynecology;  Laterality: N/A;   REDUCTION MAMMAPLASTY Bilateral 1998   REMOVAL OF STONES  05/02/2018   Procedure: REMOVAL OF STONES;  Surgeon: Sherrilyn Rist, MD;  Location: [facility];  Service: Gastroenterology;;   Dennison Mascot  05/02/2018  Procedure: SPHINCTEROTOMY;  Surgeon: Sherrilyn Rist, MD;  Location: [facility];  Service: Gastroenterology;;   Francine Graven REMOVAL  07/26/2018   Procedure: STENT REMOVAL;  Surgeon: Hilarie Fredrickson, MD;  Location: [facility];  Service: Endoscopy;;   TOTAL LAPAROSCOPIC HYSTERECTOMY WITH SALPINGECTOMY Bilateral 12/03/2020   Procedure: ATTEMPTED  LAPAROSCOPIC, OPEN ABDOMINAL HYSTERECTOMY WITH BILATERAL SALPINGO-OOPHERECTOMY AND LYSIS OF ADHESIONS;  Surgeon: Lavina Hamman, MD;  Location: [facility] OR;  Service: Gynecology;  Laterality: Bilateral;   VIDEO BRONCHOSCOPY WITH ENDOBRONCHIAL ULTRASOUND Bilateral  08/10/2023   Procedure: VIDEO BRONCHOSCOPY WITH ENDOBRONCHIAL ULTRASOUND;  Surgeon: Josephine Igo, DO;  Location: [facility];  Service: Cardiopulmonary;  Laterality: Bilateral;   Patient Active Problem List   Diagnosis Date Noted   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   Obesity, Class III, BMI 40-49.9 (morbid obesity) (HCC) 07/14/2022   Hypokalemia 07/14/2022   UTI (urinary tract infection) 07/14/2022   AKI (acute kidney injury) (HCC) 07/09/2022   Acute low back pain 07/09/2022   Uterine leiomyoma 12/03/2020   S/P abdominal hysterectomy 12/03/2020   Bile leak, postoperative    Choledocholithiasis    Intra-abdominal fluid collection    Anemia 04/28/2018   Morbid obesity with body mass index (BMI) of 50.0 to 59.9 in adult [facility]) 04/02/2018   Mass of uterus 04/02/2018   Transaminitis 04/02/2018   Acute cholecystitis s/p lap cholecystectomy 04/03/2018 04/01/2018   Pregnancy, ectopic, cornual or cervical 07/21/2017   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   Neurogenic bladder 08/23/2015   Bilateral carpal tunnel syndrome 08/23/2015   Paraparesis of both lower limbs (HCC) 07/14/2015   Ileus, postoperative (HCC) 12/25/2014   Type 2 diabetes mellitus with obesity (HCC) 12/25/2014   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HCC) 12/24/2014   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   Ileus of unspecified type (HCC)    Atelectasis    Bowel obstruction (HCC)    Encounter for central line care    Ileus [facility])    Intestinal occlusion (HCC)    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   Headache 11/19/2014   Neck pain 11/19/2014   Right sided weakness 11/19/2014   Paresthesias 11/19/2014   Nightmares 09/11/2014   Essential hypertension 03/28/2014   Nephrolithiasis 03/27/2014   Type 2 diabetes mellitus without complication, with long-term current use of insulin (HCC) 03/27/2014   Abdominal pain 03/27/2014    PCP: Earlie Lou, MD  REFERRING PROVIDER: Danae Chen, MD  REFERRING DIAG: M54.2  (ICD-10-CM) - Cervicalgia   THERAPY DIAG:  Muscle weakness (generalized)  Abnormal posture  Other lack of coordination  Difficulty in walking, not elsewhere classified  Unsteadiness on feet  History of falling  Rationale for Evaluation and Treatment: Rehabilitation  ONSET DATE: 01/06/24 referral  SUBJECTIVE:  SUBJECTIVE STATEMENT: Patient arrives to clinic alone without AD.  She reports ongoing high pain level.  She states she has been doing chair yoga.  She did some seated heel and toe raises yesterday and this prompted cramping all the way up to her bottom.  Pain at current is 10/10.   Hand dominance: Right  PERTINENT HISTORY:  Arthritis, depression, HTN, paralysis, schizophrenia, DMII  PAIN:  Are you having pain? Yes: NPRS scale: 10/10 Pain location: from origin of incision to mid/low back all the way down BLE Pain description: burning, stabbing, aching Aggravating factors: none reported Relieving factors: none reported  PRECAUTIONS: Fall  RED FLAGS: Cervical red flags: Dysphagia No, Dysmetria No, Diplopia No, Nystagmus No, and Nausea No, Bowel or bladder incontinence: No, Spinal tumors: Yes: h/o, and Cauda equina syndrome: No     WEIGHT BEARING RESTRICTIONS: No  FALLS:  Has patient fallen in last 6 months? No  LIVING ENVIRONMENT: Lives with: lives with their family Lives in: House/apartment Stairs: No Has following equipment at home: Environmental consultant - 4 wheeled, Wheelchair (power), and Grab bars  PLOF: Needs assistance with ADLs and Needs assistance with homemaking  PATIENT GOALS: "I don't know what else to do"  OBJECTIVE:  Note: Objective measures were completed at Evaluation unless otherwise noted.  DIAGNOSTIC FINDINGS:  12/08/23 c-spine MRI IMPRESSION: 1.  Postsurgical changes involving the posterior cord at the level of C4, stable from previous. No findings to suggest recurrent tumor on this noncontrast examination. 2. Mild for age cervical spondylosis as above without significant stenosis or neural impingement.  PATIENT SURVEYS:  NDI 76%  COGNITION: Overall cognitive status: Within functional limits for tasks assessed  SENSATION: Light touch: Impaired  Hot/Cold: Impaired  Proprioception: Impaired   POSTURE: rounded shoulders and forward head  PALPATION: No trigger points, increased tension around c-spine incision   CERVICAL ROM:  ~50% limited by pain, most pain with R cervical rotation  UPPER EXTREMITY ROM: Guarded and painful  UPPER EXTREMITY MMT: Limited by pain  CERVICAL SPECIAL TESTS:  Spurling's test: Positive, Distraction test: Negative, and Sharp pursor's test: Negative  TREATMENT -Further discussed upright walker and PT concerns for upper trap/cervical strain overtime due to heavy upper body reliance as well as receipt of PWC that she is not using at current and concerns for insurance coverage due to these factors.  She reports understanding and agrees to not pursue at this time unless functional status changes occur and new needs arise. -Arm bike x4 minutes forwards and x4 minutes backwards on level 5.0 for general activity tolerance, upper body strength, and cardiovascular endurance. -Wide weight shift with overhead lateral reach for squigz placement on mirror alternating UE -mini squats on airex 2x10 progressing to no UE support -Marching on airex w/ BUE support x20 -Standing bilateral shoulder flexion on airex x15 -Standing bilateral shoulder abduction on airex x15  PATIENT EDUCATION:  Education details: Continue HEP as tolerated. Person educated: Patient Education method: Explanation Education comprehension: verbalized understanding and needs further education  HOME EXERCISE PROGRAM: Access Code:  [email or web address] URL: [email or web address] Date: 02/04/2024 Prepared by: Camille Bal  Exercises - Prone Press Up On Elbows  - 1 x daily - 5 x weekly - 2 sets - 10 reps - Supine Bridge  - 1 x daily - 5 x weekly - 2 sets - 10 reps - Supine Cervical Retraction with Towel  - 1 x daily - 5 x weekly - 2 sets - 12 reps - 1-2 seconds hold - Supine Chin Tuck  -  1 x daily - 5 x weekly - 2 sets - 10 reps - Scapular retraction with resistance  - 1 x daily - 5 x weekly - 3 sets - 10 reps  -Chair yoga - poses established on 3/28 visit  ASSESSMENT:  CLINICAL IMPRESSION: Pt remains limited by low activity tolerance and ongoing high chronic pain response.  PT attempting to address shoulder mobility and balance this visit.  She has mild static instability standing on airex, but no over LOB.  She fatigues quickly with reaching and weight shifting tasks as with most land based upright mobility, but continues to benefit from PT in this setting to improve her tolerance and self efficacy to self-manage symptoms.  Continue per POC.  OBJECTIVE IMPAIRMENTS: Abnormal gait, decreased activity tolerance, decreased knowledge of condition, decreased mobility, decreased strength, hypomobility, increased fascial restrictions, impaired sensation, impaired UE functional use, improper body mechanics, postural dysfunction, and pain.   ACTIVITY LIMITATIONS: carrying, lifting, dressing, reach over head, hygiene/grooming, locomotion level, and caring for others  PARTICIPATION LIMITATIONS: meal prep, cleaning, driving, shopping, community activity, and occupation  PERSONAL FACTORS: Behavior pattern, Fitness, Past/current experiences, Time since onset of injury/illness/exacerbation, Transportation, and 3+ comorbidities: see above  are also affecting patient's functional outcome.   REHAB POTENTIAL: Fair chronicity, unknown etiology  CLINICAL DECISION MAKING: Stable/uncomplicated  EVALUATION COMPLEXITY:  Moderate   GOALS: Goals reviewed with patient? Yes  SHORT TERM GOALS: Target date: 02/11/24  Pt will be independent with initial HEP for improved pain relief and posture  Baseline:  to be provided Goal status: INITIAL  2.  Patient will improve NDI score to </= 65% to demonstrate a reduction in pain impacting mobility  Baseline: 76% Goal status: INITIAL    LONG TERM GOALS: Target date: 03/10/24  Pt will be independent with final HEP for improved pain relief and mobility   Baseline: to be provided; Established initial land HEP (3/21) Goal status: IN PROGRESS  2.  Patient will improve NDI score to </= 55% to demonstrate a reduction in pain and improved mobility Baseline: 76%; 68% (3/21) Goal status: INITIAL  GOALS (re-cert 1/61): Goals reviewed with patient? Yes  SHORT TERM GOALS = LONG TERM GOALS: Target date: 03/17/24  Pt will be independent with finalized land and aquatic HEP for improved pain relief and mobility.  Baseline: Established initial land HEP (3/21) Goal status: IN PROGRESS  2.  Patient will improve NDI score to </= 55% to demonstrate a reduction in pain and improved mobility. Baseline: 76%; 68% (3/21) Goal status: IN PROGRESS  3.  Pt will decrease 5xSTS to </=19.56 seconds in order to demonstrate decreased risk for falls and improved functional bilateral LE strength and power. Baseline: 24.56 sec w/ BUE support (3/21) Goal status: INITIAL  4.  Pt will ambulate >/=553 feet on to demonstrate improved functional endurance for home and community participation. Baseline: 453 ft no AD (3/21) Goal status: INITIAL  PLAN:  PT FREQUENCY: 1x/week + 2x/wk  PT DURATION: 6 weeks + 4 wks (re-cert 0/96)  PLANNED INTERVENTIONS: 04540- PT Re-evaluation, 97110-Therapeutic exercises, 97530- Therapeutic activity, 97112- Neuromuscular re-education, 97535- Self Care, 98119- Manual therapy, [phone]- Gait training, [phone]- Orthotic Fit/training, [phone]- Canalith repositioning,  [id number]- Aquatic Therapy, [phone]- Electrical stimulation (unattended), [phone]- Electrical stimulation (manual), Patient/Family education, Balance training, Taping, Dry Needling, Joint mobilization, Spinal mobilization, Scar mobilization, Vestibular training, Visual/preceptual remediation/compensation, DME instructions, Cryotherapy, and Moist heat  PLAN FOR NEXT SESSION: HEP modifications - balance, stretching, posture training; SciFit  Aquatic HEP:  balance, general  mobility, pain/stiffness management with stretching program, shoulder/periscapular strength; establish aquatic HEP!   Sadie Haber, PT, DPT  03/03/2024, 3:35 PM

## 2024-03-03 NOTE — Therapy (Signed)
 OUTPATIENT PHYSICAL THERAPY CERVICAL TREATMENT   Patient Name: COLLEN HOSTLER MRN: [id number] DOB:15-Aug-1981, 43 y.o., female Today's Date: 03/03/2024  END OF SESSION:  PT End of Session - 03/02/24 1145     Visit Number 6    Number of Visits 15   7 + 8   Date for PT Re-Evaluation 40/98/11   re-cert to allow for aquatic scheduling   Authorization Type [employer] dual/medicaid    PT Start Time 1145    PT Stop Time 1230    PT Time Calculation (min) 45 min    Equipment Utilized During Treatment Other (comment)   floatation devices as needed   Activity Tolerance Patient tolerated treatment well    Behavior During Therapy WFL for tasks assessed/performed             Past Medical History:  Diagnosis Date   Abscess of bursa, left elbow 06/2013   Treated with I and D/antibiotics.    Arthritis    Asthma    Depression    History of blood transfusion    "after I had one of my kids"   History of kidney stones    Hypertension    Ovarian cyst    Paralysis (HCC)    "no feeling from the waist down"   Schizophrenia (HCC)    Sickle cell disease ruled out    Patient with no evidence of Hgb S on eletrophoresis x3   Type II diabetes mellitus (HCC)    Past Surgical History:  Procedure Laterality Date   APPENDECTOMY  2013   BILIARY STENT PLACEMENT N/A 05/02/2018   Procedure: BILIARY STENT PLACEMENT;  Surgeon: Sherrilyn Rist, MD;  Location: [facility];  Service: Gastroenterology;  Laterality: N/A;   BOWEL RESECTION  12/03/2020   Procedure: SMALL BOWEL RESECTION;  Surgeon: Sheliah Hatch, De Blanch, MD;  Location: [facility] OR;  Service: General;;   BRONCHIAL NEEDLE ASPIRATION BIOPSY  08/10/2023   Procedure: BRONCHIAL NEEDLE ASPIRATION BIOPSIES;  Surgeon: Josephine Igo, DO;  Location: [facility];  Service: Cardiopulmonary;;   CARPAL TUNNEL RELEASE     CESAREAN SECTION  2000; 2007; 2011   CHOLECYSTECTOMY N/A 04/03/2018   Procedure: LAPAROSCOPIC CHOLECYSTECTOMY;  Surgeon: Emelia Loron, MD;   Location: [facility] OR;  Service: General;  Laterality: N/A;   DILATATION & CURETTAGE/HYSTEROSCOPY WITH MYOSURE N/A 10/22/2020   Procedure: DILATATION & CURETTAGE/HYSTEROSCOPY WITH MYOSURE;  Surgeon: Lavina Hamman, MD;  Location: [facility] OR;  Service: Gynecology;  Laterality: N/A;   DILATION AND CURETTAGE OF UTERUS     ERCP N/A 05/02/2018   Procedure: ENDOSCOPIC RETROGRADE CHOLANGIOPANCREATOGRAPHY (ERCP);  Surgeon: Sherrilyn Rist, MD;  Location: [facility];  Service: Gastroenterology;  Laterality: N/A;   ERCP N/A 07/26/2018   Procedure: ENDOSCOPIC RETROGRADE CHOLANGIOPANCREATOGRAPHY (ERCP);  Surgeon: Hilarie Fredrickson, MD;  Location: [facility];  Service: Endoscopy;  Laterality: N/A;  stent removal   IR RADIOLOGIST EVAL & MGMT  05/19/2018   LAMINECTOMY N/A 12/13/2014   Procedure: CERVICAL LAMINECTOMY FOR INTRADURAL TUMOR;  Surgeon: Temple Pacini, MD;  Location: [facility];  Service: Neurosurgery;  Laterality: N/A;  posterior   LAPAROSCOPY N/A 07/20/2017   Procedure: LAPAROSCOPY OPERATIVE WITH WEDGE RESECTION RIGHT CORNUA AND PARTIAL SALPINGECTOMY;  Surgeon: Reva Bores, MD;  Location: [facility];  Service: Gynecology;  Laterality: N/A;   REDUCTION MAMMAPLASTY Bilateral 1998   REMOVAL OF STONES  05/02/2018   Procedure: REMOVAL OF STONES;  Surgeon: Sherrilyn Rist, MD;  Location: [facility];  Service: Gastroenterology;;  SPHINCTEROTOMY  05/02/2018   Procedure: SPHINCTEROTOMY;  Surgeon: Sherrilyn Rist, MD;  Location: [facility];  Service: Gastroenterology;;   Francine Graven REMOVAL  07/26/2018   Procedure: STENT REMOVAL;  Surgeon: Hilarie Fredrickson, MD;  Location: [facility];  Service: Endoscopy;;   TOTAL LAPAROSCOPIC HYSTERECTOMY WITH SALPINGECTOMY Bilateral 12/03/2020   Procedure: ATTEMPTED  LAPAROSCOPIC, OPEN ABDOMINAL HYSTERECTOMY WITH BILATERAL SALPINGO-OOPHERECTOMY AND LYSIS OF ADHESIONS;  Surgeon: Lavina Hamman, MD;  Location: [facility] OR;  Service: Gynecology;  Laterality: Bilateral;   VIDEO BRONCHOSCOPY WITH  ENDOBRONCHIAL ULTRASOUND Bilateral 08/10/2023   Procedure: VIDEO BRONCHOSCOPY WITH ENDOBRONCHIAL ULTRASOUND;  Surgeon: Josephine Igo, DO;  Location: [facility];  Service: Cardiopulmonary;  Laterality: Bilateral;   Patient Active Problem List   Diagnosis Date Noted   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   Obesity, Class III, BMI 40-49.9 (morbid obesity) (HCC) 07/14/2022   Hypokalemia 07/14/2022   UTI (urinary tract infection) 07/14/2022   AKI (acute kidney injury) (HCC) 07/09/2022   Acute low back pain 07/09/2022   Uterine leiomyoma 12/03/2020   S/P abdominal hysterectomy 12/03/2020   Bile leak, postoperative    Choledocholithiasis    Intra-abdominal fluid collection    Anemia 04/28/2018   Morbid obesity with body mass index (BMI) of 50.0 to 59.9 in adult [facility]) 04/02/2018   Mass of uterus 04/02/2018   Transaminitis 04/02/2018   Acute cholecystitis s/p lap cholecystectomy 04/03/2018 04/01/2018   Pregnancy, ectopic, cornual or cervical 07/21/2017   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   Neurogenic bladder 08/23/2015   Bilateral carpal tunnel syndrome 08/23/2015   Paraparesis of both lower limbs (HCC) 07/14/2015   Ileus, postoperative (HCC) 12/25/2014   Type 2 diabetes mellitus with obesity (HCC) 12/25/2014   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HCC) 12/24/2014   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   Ileus of unspecified type (HCC)    Atelectasis    Bowel obstruction (HCC)    Encounter for central line care    Ileus [facility])    Intestinal occlusion (HCC)    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   Headache 11/19/2014   Neck pain 11/19/2014   Right sided weakness 11/19/2014   Paresthesias 11/19/2014   Nightmares 09/11/2014   Essential hypertension 03/28/2014   Nephrolithiasis 03/27/2014   Type 2 diabetes mellitus without complication, with long-term current use of insulin (HCC) 03/27/2014   Abdominal pain 03/27/2014    PCP: Earlie Lou, MD  REFERRING PROVIDER: Danae Chen, MD  REFERRING DIAG: M54.2 (ICD-10-CM) - Cervicalgia   THERAPY DIAG:  Muscle weakness (generalized)  Abnormal posture  Other lack of coordination  Difficulty in walking, not elsewhere classified  Unsteadiness on feet  History of falling  Rationale for Evaluation and Treatment: Rehabilitation  ONSET DATE: 01/06/24 referral  SUBJECTIVE:  SUBJECTIVE STATEMENT: Patient arrives to [facility] alone without AD.  She reports ongoing high pain level.  She states heat from hot tub prior to session has really helped her pain.  Pain at current is 10/10.   Hand dominance: Right  PERTINENT HISTORY:  Arthritis, depression, HTN, paralysis, schizophrenia, DMII  PAIN:  Are you having pain? Yes: NPRS scale: 10/10 Pain location: from origin of incision to mid/low back all the way down BLE Pain description: burning, stabbing, aching Aggravating factors: none reported Relieving factors: none reported  PRECAUTIONS: Fall  RED FLAGS: Cervical red flags: Dysphagia No, Dysmetria No, Diplopia No, Nystagmus No, and Nausea No, Bowel or bladder incontinence: No, Spinal tumors: Yes: h/o, and Cauda equina syndrome: No     WEIGHT BEARING RESTRICTIONS: No  FALLS:  Has patient fallen in last 6 months? No  LIVING ENVIRONMENT: Lives with: lives with their family Lives in: House/apartment Stairs: No Has following equipment at home: Environmental consultant - 4 wheeled, Wheelchair (power), and Grab bars  PLOF: Needs assistance with ADLs and Needs assistance with homemaking  PATIENT GOALS: "I don't know what else to do"  OBJECTIVE:  Note: Objective measures were completed at Evaluation unless otherwise noted.  DIAGNOSTIC FINDINGS:  12/08/23 c-spine MRI IMPRESSION: 1. Postsurgical changes involving the posterior cord at  the level of C4, stable from previous. No findings to suggest recurrent tumor on this noncontrast examination. 2. Mild for age cervical spondylosis as above without significant stenosis or neural impingement.  PATIENT SURVEYS:  NDI 76%  COGNITION: Overall cognitive status: Within functional limits for tasks assessed  SENSATION: Light touch: Impaired  Hot/Cold: Impaired  Proprioception: Impaired   POSTURE: rounded shoulders and forward head  PALPATION: No trigger points, increased tension around c-spine incision   CERVICAL ROM:  ~50% limited by pain, most pain with R cervical rotation  UPPER EXTREMITY ROM: Guarded and painful  UPPER EXTREMITY MMT: Limited by pain  CERVICAL SPECIAL TESTS:  Spurling's test: Positive, Distraction test: Negative, and Sharp pursor's test: Negative  TREATMENT Aquatic therapy at [facility] - pool temperature 92 degrees   Patient seen for aquatic therapy today.  Treatment took place in water 3.6-4.8 feet deep depending upon activity.  Patient entered and exited the pool via stairs using reciprocal gait and bilateral rails at mod I level.   Exercises: Walking warmup 6x18 ft forward > laterally > backwards -Tandem walking w/ yellow noodle support 6x18 ft, pt has difficulty obtaining fully narrowed stance despite cues and return demo -Supported tandem stance 3x30 sec each LE in rear -SLS supported at pool wall 3x30 sec each LE -Feet together eyes open 2x30 sec > eyes closed 2x30 seconds -Squatted walk 6x18 ft forwards in shallow depth -Forward shallow lunges w/ wall support -Step taps alternating LE x10 each unsupported -[employer] 2x20 -Walking march unsupported 6x18 ft  Patient requires buoyancy of the water for support for reduced fall risk with gait training and balance exercises with SBA support. Exercises able to be performed safely in water without the risk of fall compared to those same exercises performed on land; viscosity of  water needed for resistance for strengthening. Current of water provides perturbations for challenging static and dynamic balance.   PATIENT EDUCATION:  Education details: Will establish aquatic HEP. Person educated: Patient Education method: Explanation Education comprehension: verbalized understanding and needs further education  HOME EXERCISE PROGRAM: Access Code: [email or web address] URL: [email or web address] Date: 02/04/2024 Prepared by: Camille Bal  Exercises - Prone Press Up On Elbows  - 1 x daily -  5 x weekly - 2 sets - 10 reps - Supine Bridge  - 1 x daily - 5 x weekly - 2 sets - 10 reps - Supine Cervical Retraction with Towel  - 1 x daily - 5 x weekly - 2 sets - 12 reps - 1-2 seconds hold - Supine Chin Tuck  - 1 x daily - 5 x weekly - 2 sets - 10 reps - Scapular retraction with resistance  - 1 x daily - 5 x weekly - 3 sets - 10 reps  -Chair yoga - poses established on 3/28 visit  ASSESSMENT:  CLINICAL IMPRESSION: Focus of skilled session on ongoing pain management and strengthening.  Pt continues to have high levels of pain despite reports of relief with heat modalities.  PT to continue to address deficits per POC, but pt may be close to maximized potential in these settings by end of current POC.  Will reassess.  OBJECTIVE IMPAIRMENTS: Abnormal gait, decreased activity tolerance, decreased knowledge of condition, decreased mobility, decreased strength, hypomobility, increased fascial restrictions, impaired sensation, impaired UE functional use, improper body mechanics, postural dysfunction, and pain.   ACTIVITY LIMITATIONS: carrying, lifting, dressing, reach over head, hygiene/grooming, locomotion level, and caring for others  PARTICIPATION LIMITATIONS: meal prep, cleaning, driving, shopping, community activity, and occupation  PERSONAL FACTORS: Behavior pattern, Fitness, Past/current experiences, Time since onset of injury/illness/exacerbation, Transportation,  and 3+ comorbidities: see above  are also affecting patient's functional outcome.   REHAB POTENTIAL: Fair chronicity, unknown etiology  CLINICAL DECISION MAKING: Stable/uncomplicated  EVALUATION COMPLEXITY: Moderate   GOALS: Goals reviewed with patient? Yes  SHORT TERM GOALS: Target date: 02/11/24  Pt will be independent with initial HEP for improved pain relief and posture  Baseline:  to be provided Goal status: INITIAL  2.  Patient will improve NDI score to </= 65% to demonstrate a reduction in pain impacting mobility  Baseline: 76% Goal status: INITIAL    LONG TERM GOALS: Target date: 03/10/24  Pt will be independent with final HEP for improved pain relief and mobility   Baseline: to be provided; Established initial land HEP (3/21) Goal status: IN PROGRESS  2.  Patient will improve NDI score to </= 55% to demonstrate a reduction in pain and improved mobility Baseline: 76%; 68% (3/21) Goal status: INITIAL  GOALS (re-cert 4/09): Goals reviewed with patient? Yes  SHORT TERM GOALS = LONG TERM GOALS: Target date: 03/17/24  Pt will be independent with finalized land and aquatic HEP for improved pain relief and mobility.  Baseline: Established initial land HEP (3/21) Goal status: IN PROGRESS  2.  Patient will improve NDI score to </= 55% to demonstrate a reduction in pain and improved mobility. Baseline: 76%; 68% (3/21) Goal status: IN PROGRESS  3.  Pt will decrease 5xSTS to </=19.56 seconds in order to demonstrate decreased risk for falls and improved functional bilateral LE strength and power. Baseline: 24.56 sec w/ BUE support (3/21) Goal status: INITIAL  4.  Pt will ambulate >/=553 feet on to demonstrate improved functional endurance for home and community participation. Baseline: 453 ft no AD (3/21) Goal status: INITIAL  PLAN:  PT FREQUENCY: 1x/week + 2x/wk  PT DURATION: 6 weeks + 4 wks (re-cert 8/11)  PLANNED INTERVENTIONS: [id number]- PT Re-evaluation,  97110-Therapeutic exercises, 97530- Therapeutic activity, 97112- Neuromuscular re-education, 97535- Self Care, [id number]- Manual therapy, [phone]- Gait training, [phone]- Orthotic Fit/training, [phone]- Canalith repositioning, [id number]- Aquatic Therapy, [phone]- Electrical stimulation (unattended), [phone]- Electrical stimulation (manual), Patient/Family education, Balance training, Taping, Dry Needling,  Joint mobilization, Spinal mobilization, Scar mobilization, Vestibular training, Visual/preceptual remediation/compensation, DME instructions, Cryotherapy, and Moist heat  PLAN FOR NEXT SESSION: HEP modifications - balance, stretching, posture training; SciFit  Aquatic HEP:  balance, general mobility, pain/stiffness management with stretching program, shoulder/periscapular strength; establish aquatic HEP!   Sadie Haber, PT, DPT  03/03/2024, 8:35 AM

## 2024-03-09 ENCOUNTER — Encounter: Payer: Self-pay | Admitting: Physical Therapy

## 2024-03-09 ENCOUNTER — Ambulatory Visit: Admitting: Physical Therapy

## 2024-03-09 DIAGNOSIS — R293 Abnormal posture: Secondary | ICD-10-CM

## 2024-03-09 DIAGNOSIS — M6281 Muscle weakness (generalized): Secondary | ICD-10-CM

## 2024-03-09 DIAGNOSIS — R278 Other lack of coordination: Secondary | ICD-10-CM

## 2024-03-09 DIAGNOSIS — Z9181 History of falling: Secondary | ICD-10-CM

## 2024-03-09 DIAGNOSIS — R2681 Unsteadiness on feet: Secondary | ICD-10-CM

## 2024-03-09 DIAGNOSIS — R262 Difficulty in walking, not elsewhere classified: Secondary | ICD-10-CM

## 2024-03-09 NOTE — Therapy (Signed)
 OUTPATIENT PHYSICAL THERAPY CERVICAL TREATMENT/AQUATIC NOTE   Patient Name: Cynthia Rosales MRN: [id number] DOB:[birth year], 43 y.o., female Today's Date: 03/09/2024  END OF SESSION:  PT End of Session - 03/09/24 1259     Visit Number 8    Number of Visits 15   7 + 8   Date for PT Re-Evaluation 11/91/47   re-cert to allow for aquatic scheduling   Authorization Type [employer] dual/medicaid    PT Start Time 1015    PT Stop Time 1100    PT Time Calculation (min) 45 min    Equipment Utilized During Treatment Other (comment)   floatation devices as needed for support   Activity Tolerance Patient tolerated treatment well    Behavior During Therapy WFL for tasks assessed/performed             Past Medical History:  Diagnosis Date   Abscess of bursa, left elbow 06/2013   Treated with I and D/antibiotics.    Arthritis    Asthma    Depression    History of blood transfusion    "after I had one of my kids"   History of kidney stones    Hypertension    Ovarian cyst    Paralysis (HCC)    "no feeling from the waist down"   Schizophrenia (HCC)    Sickle cell disease ruled out    Patient with no evidence of Hgb S on eletrophoresis x3   Type II diabetes mellitus (HCC)    Past Surgical History:  Procedure Laterality Date   APPENDECTOMY  2013   BILIARY STENT PLACEMENT N/A 05/02/2018   Procedure: BILIARY STENT PLACEMENT;  Surgeon: Sherrilyn Rist, MD;  Location: [facility];  Service: Gastroenterology;  Laterality: N/A;   BOWEL RESECTION  12/03/2020   Procedure: SMALL BOWEL RESECTION;  Surgeon: Sheliah Hatch, De Blanch, MD;  Location: [facility] OR;  Service: General;;   BRONCHIAL NEEDLE ASPIRATION BIOPSY  08/10/2023   Procedure: BRONCHIAL NEEDLE ASPIRATION BIOPSIES;  Surgeon: Josephine Igo, DO;  Location: [facility];  Service: Cardiopulmonary;;   CARPAL TUNNEL RELEASE     CESAREAN SECTION  2000; 2007; 2011   CHOLECYSTECTOMY N/A 04/03/2018   Procedure: LAPAROSCOPIC CHOLECYSTECTOMY;  Surgeon:  Emelia Loron, MD;  Location: [facility] OR;  Service: General;  Laterality: N/A;   DILATATION & CURETTAGE/HYSTEROSCOPY WITH MYOSURE N/A 10/22/2020   Procedure: DILATATION & CURETTAGE/HYSTEROSCOPY WITH MYOSURE;  Surgeon: Lavina Hamman, MD;  Location: [facility] OR;  Service: Gynecology;  Laterality: N/A;   DILATION AND CURETTAGE OF UTERUS     ERCP N/A 05/02/2018   Procedure: ENDOSCOPIC RETROGRADE CHOLANGIOPANCREATOGRAPHY (ERCP);  Surgeon: Sherrilyn Rist, MD;  Location: [facility];  Service: Gastroenterology;  Laterality: N/A;   ERCP N/A 07/26/2018   Procedure: ENDOSCOPIC RETROGRADE CHOLANGIOPANCREATOGRAPHY (ERCP);  Surgeon: Hilarie Fredrickson, MD;  Location: [facility];  Service: Endoscopy;  Laterality: N/A;  stent removal   IR RADIOLOGIST EVAL & MGMT  05/19/2018   LAMINECTOMY N/A 12/13/2014   Procedure: CERVICAL LAMINECTOMY FOR INTRADURAL TUMOR;  Surgeon: Temple Pacini, MD;  Location: [facility];  Service: Neurosurgery;  Laterality: N/A;  posterior   LAPAROSCOPY N/A 07/20/2017   Procedure: LAPAROSCOPY OPERATIVE WITH WEDGE RESECTION RIGHT CORNUA AND PARTIAL SALPINGECTOMY;  Surgeon: Reva Bores, MD;  Location: [facility];  Service: Gynecology;  Laterality: N/A;   REDUCTION MAMMAPLASTY Bilateral 1998   REMOVAL OF STONES  05/02/2018   Procedure: REMOVAL OF STONES;  Surgeon: Sherrilyn Rist, MD;  Location: [facility];  Service: Gastroenterology;;   Dennison Mascot  05/02/2018   Procedure: SPHINCTEROTOMY;  Surgeon: Sherrilyn Rist, MD;  Location: [facility];  Service: Gastroenterology;;   Francine Graven REMOVAL  07/26/2018   Procedure: STENT REMOVAL;  Surgeon: Hilarie Fredrickson, MD;  Location: [facility];  Service: Endoscopy;;   TOTAL LAPAROSCOPIC HYSTERECTOMY WITH SALPINGECTOMY Bilateral 12/03/2020   Procedure: ATTEMPTED  LAPAROSCOPIC, OPEN ABDOMINAL HYSTERECTOMY WITH BILATERAL SALPINGO-OOPHERECTOMY AND LYSIS OF ADHESIONS;  Surgeon: Lavina Hamman, MD;  Location: [facility] OR;  Service: Gynecology;  Laterality: Bilateral;    VIDEO BRONCHOSCOPY WITH ENDOBRONCHIAL ULTRASOUND Bilateral 08/10/2023   Procedure: VIDEO BRONCHOSCOPY WITH ENDOBRONCHIAL ULTRASOUND;  Surgeon: Josephine Igo, DO;  Location: [facility];  Service: Cardiopulmonary;  Laterality: Bilateral;   Patient Active Problem List   Diagnosis Date Noted   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   Obesity, Class III, BMI 40-49.9 (morbid obesity) (HCC) 07/14/2022   Hypokalemia 07/14/2022   UTI (urinary tract infection) 07/14/2022   AKI (acute kidney injury) (HCC) 07/09/2022   Acute low back pain 07/09/2022   Uterine leiomyoma 12/03/2020   S/P abdominal hysterectomy 12/03/2020   Bile leak, postoperative    Choledocholithiasis    Intra-abdominal fluid collection    Anemia 04/28/2018   Morbid obesity with body mass index (BMI) of 50.0 to 59.9 in adult [facility]) 04/02/2018   Mass of uterus 04/02/2018   Transaminitis 04/02/2018   Acute cholecystitis s/p lap cholecystectomy 04/03/2018 04/01/2018   Pregnancy, ectopic, cornual or cervical 07/21/2017   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   Neurogenic bladder 08/23/2015   Bilateral carpal tunnel syndrome 08/23/2015   Paraparesis of both lower limbs (HCC) 07/14/2015   Ileus, postoperative (HCC) 12/25/2014   Type 2 diabetes mellitus with obesity (HCC) 12/25/2014   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HCC) 12/24/2014   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   Ileus of unspecified type (HCC)    Atelectasis    Bowel obstruction (HCC)    Encounter for central line care    Ileus [facility])    Intestinal occlusion (HCC)    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   Headache 11/19/2014   Neck pain 11/19/2014   Right sided weakness 11/19/2014   Paresthesias 11/19/2014   Nightmares 09/11/2014   Essential hypertension 03/28/2014   Nephrolithiasis 03/27/2014   Type 2 diabetes mellitus without complication, with long-term current use of insulin (HCC) 03/27/2014   Abdominal pain 03/27/2014    PCP: Earlie Lou, MD  REFERRING  PROVIDER: Danae Chen, MD  REFERRING DIAG: M54.2 (ICD-10-CM) - Cervicalgia   THERAPY DIAG:  Muscle weakness (generalized)  Abnormal posture  Other lack of coordination  Difficulty in walking, not elsewhere classified  Unsteadiness on feet  History of falling  Rationale for Evaluation and Treatment: Rehabilitation  ONSET DATE: 01/06/24 referral  SUBJECTIVE:  SUBJECTIVE STATEMENT: Patient arrives to [facility] early, alone without AD.  She reports ongoing high pain level - she tried hot tub prior to getting into therapy pool but this did not change her pain.  Pain at current is 10/10.   Hand dominance: Right  PERTINENT HISTORY:  Arthritis, depression, HTN, paralysis, schizophrenia, DMII  PAIN:  Are you having pain? Yes: NPRS scale: 10/10 Pain location: from origin of incision to mid/low back all the way down BLE Pain description: burning, stabbing, aching Aggravating factors: none reported Relieving factors: none reported  PRECAUTIONS: Fall  RED FLAGS: Cervical red flags: Dysphagia No, Dysmetria No, Diplopia No, Nystagmus No, and Nausea No, Bowel or bladder incontinence: No, Spinal tumors: Yes: h/o, and Cauda equina syndrome: No     WEIGHT BEARING RESTRICTIONS: No  FALLS:  Has patient fallen in last 6 months? No  LIVING ENVIRONMENT: Lives with: lives with their family Lives in: House/apartment Stairs: No Has following equipment at home: Environmental consultant - 4 wheeled, Wheelchair (power), and Grab bars  PLOF: Needs assistance with ADLs and Needs assistance with homemaking  PATIENT GOALS: "I don't know what else to do"  OBJECTIVE:  Note: Objective measures were completed at Evaluation unless otherwise noted.  DIAGNOSTIC FINDINGS:  12/08/23 c-spine MRI IMPRESSION: 1.  Postsurgical changes involving the posterior cord at the level of C4, stable from previous. No findings to suggest recurrent tumor on this noncontrast examination. 2. Mild for age cervical spondylosis as above without significant stenosis or neural impingement.  PATIENT SURVEYS:  NDI 76%  COGNITION: Overall cognitive status: Within functional limits for tasks assessed  SENSATION: Light touch: Impaired  Hot/Cold: Impaired  Proprioception: Impaired   POSTURE: rounded shoulders and forward head  PALPATION: No trigger points, increased tension around c-spine incision   CERVICAL ROM:  ~50% limited by pain, most pain with R cervical rotation  UPPER EXTREMITY ROM: Guarded and painful  UPPER EXTREMITY MMT: Limited by pain  CERVICAL SPECIAL TESTS:  Spurling's test: Positive, Distraction test: Negative, and Sharp pursor's test: Negative  TREATMENT Aquatic therapy at [facility] - pool temperature 93-94 degrees   Patient seen for aquatic therapy today.  Treatment took place in water 3.6-4.8 feet deep depending upon activity.  Patient entered and exited the pool via stairs using step to pattern and bilateral rails at Mod I level.   Exercises: Water walking warmup:  forward > lateral > retro-stepping 6x18 ft Resisted walking in chest depth water using paddle board 6x18 ft Saddle sitting on noodle > added second noodle for posterior support > marching 2x1 minute > LAQ 2x1 minute Straddle sitting on yellow noodle paddling with BUE/BLE 6x18 ft Supine flutter kicks supported on noodle > gentle floating x2 minutes > supine abduction/adduction supported on noodle Wall supported posterior kicks for core and glut engagement 2x1 minute, discussed using increased forearm support but pt tolerated long prone positioning better Wall supported knee tucks for core 2x10 > plank jacks 2x10 Using green hand paddles for resisted walking marches 6x18 ft Lateral walking squats w/ resisted UE  abduction/adduction 6x18 ft  Patient requires buoyancy of the water for support for reduced fall risk with gait training and balance exercises as well as back pain management with no more than SBA support. Exercises able to be performed safely in water without the risk of fall compared to those same exercises performed on land; viscosity of water needed for resistance for strengthening. Current of water provides perturbations for challenging static and dynamic balance.   PATIENT EDUCATION:  Education details: Continue land HEP as tolerated.  Will provide aquatic HEP at next visit. Person educated: Patient Education method: Explanation Education comprehension: verbalized understanding and needs further education  HOME EXERCISE PROGRAM: Access Code: [email or web address] URL: [email or web address] Date: 02/04/2024 Prepared by: Camille Bal  Exercises - Prone Press Up On Elbows  - 1 x daily - 5 x weekly - 2 sets - 10 reps - Supine Bridge  - 1 x daily - 5 x weekly - 2 sets - 10 reps - Supine Cervical Retraction with Towel  - 1 x daily - 5 x weekly - 2 sets - 12 reps - 1-2 seconds hold - Supine Chin Tuck  - 1 x daily - 5 x weekly - 2 sets - 10 reps - Scapular retraction with resistance  - 1 x daily - 5 x weekly - 3 sets - 10 reps  -Chair yoga - poses established on 3/28 visit  ASSESSMENT:  CLINICAL IMPRESSION: Pt continues to have no positive pain response to aquatic modality though she has no reported pain increase.  PT planning to establish aquatic HEP per pt request and review next and final pool session.  Will continue to address pain management as able at remaining land and aquatic appts.  Continue per POC.  OBJECTIVE IMPAIRMENTS: Abnormal gait, decreased activity tolerance, decreased knowledge of condition, decreased mobility, decreased strength, hypomobility, increased fascial restrictions, impaired sensation, impaired UE functional use, improper body mechanics, postural  dysfunction, and pain.   ACTIVITY LIMITATIONS: carrying, lifting, dressing, reach over head, hygiene/grooming, locomotion level, and caring for others  PARTICIPATION LIMITATIONS: meal prep, cleaning, driving, shopping, community activity, and occupation  PERSONAL FACTORS: Behavior pattern, Fitness, Past/current experiences, Time since onset of injury/illness/exacerbation, Transportation, and 3+ comorbidities: see above  are also affecting patient's functional outcome.   REHAB POTENTIAL: Fair chronicity, unknown etiology  CLINICAL DECISION MAKING: Stable/uncomplicated  EVALUATION COMPLEXITY: Moderate   GOALS: Goals reviewed with patient? Yes  SHORT TERM GOALS: Target date: 02/11/24  Pt will be independent with initial HEP for improved pain relief and posture  Baseline:  to be provided Goal status: INITIAL  2.  Patient will improve NDI score to </= 65% to demonstrate a reduction in pain impacting mobility  Baseline: 76% Goal status: INITIAL    LONG TERM GOALS: Target date: 03/10/24  Pt will be independent with final HEP for improved pain relief and mobility   Baseline: to be provided; Established initial land HEP (3/21) Goal status: IN PROGRESS  2.  Patient will improve NDI score to </= 55% to demonstrate a reduction in pain and improved mobility Baseline: 76%; 68% (3/21) Goal status: INITIAL  GOALS (re-cert 1/61): Goals reviewed with patient? Yes  SHORT TERM GOALS = LONG TERM GOALS: Target date: 03/17/24  Pt will be independent with finalized land and aquatic HEP for improved pain relief and mobility.  Baseline: Established initial land HEP (3/21) Goal status: IN PROGRESS  2.  Patient will improve NDI score to </= 55% to demonstrate a reduction in pain and improved mobility. Baseline: 76%; 68% (3/21) Goal status: IN PROGRESS  3.  Pt will decrease 5xSTS to </=19.56 seconds in order to demonstrate decreased risk for falls and improved functional bilateral LE strength  and power. Baseline: 24.56 sec w/ BUE support (3/21) Goal status: INITIAL  4.  Pt will ambulate >/=553 feet on to demonstrate improved functional endurance for home and community participation. Baseline: 453 ft no AD (3/21) Goal status: INITIAL  PLAN:  PT FREQUENCY: 1x/week +  2x/wk  PT DURATION: 6 weeks + 4 wks (re-cert 1/61)  PLANNED INTERVENTIONS: [id number]- PT Re-evaluation, 97110-Therapeutic exercises, 97530- Therapeutic activity, 97112- Neuromuscular re-education, 97535- Self Care, 54098- Manual therapy, [phone]- Gait training, [phone]- Orthotic Fit/training, [phone]- Canalith repositioning, [id number]- Aquatic Therapy, [phone]- Electrical stimulation (unattended), [phone]- Electrical stimulation (manual), Patient/Family education, Balance training, Taping, Dry Needling, Joint mobilization, Spinal mobilization, Scar mobilization, Vestibular training, Visual/preceptual remediation/compensation, DME instructions, Cryotherapy, and Moist heat  PLAN FOR NEXT SESSION: HEP modifications - balance, stretching, posture training; SciFit  Aquatic HEP:  establish aquatic HEP - review!  Sadie Haber, PT, DPT  03/09/2024, 1:04 PM

## 2024-03-10 ENCOUNTER — Ambulatory Visit: Admitting: Physical Therapy

## 2024-03-10 ENCOUNTER — Encounter: Payer: Self-pay | Admitting: Physical Therapy

## 2024-03-10 DIAGNOSIS — R293 Abnormal posture: Secondary | ICD-10-CM

## 2024-03-10 DIAGNOSIS — M6281 Muscle weakness (generalized): Secondary | ICD-10-CM | POA: Diagnosis not present

## 2024-03-10 DIAGNOSIS — R2681 Unsteadiness on feet: Secondary | ICD-10-CM

## 2024-03-10 DIAGNOSIS — R262 Difficulty in walking, not elsewhere classified: Secondary | ICD-10-CM

## 2024-03-10 DIAGNOSIS — Z9181 History of falling: Secondary | ICD-10-CM

## 2024-03-10 DIAGNOSIS — R278 Other lack of coordination: Secondary | ICD-10-CM

## 2024-03-10 NOTE — Therapy (Signed)
 OUTPATIENT PHYSICAL THERAPY CERVICAL TREATMENT   Patient Name: Cynthia Rosales MRN: [id number] DOB:10-Nov-1981, 43 y.o., female Today's Date: 03/10/2024  END OF SESSION:  PT End of Session - 03/10/24 1455     Visit Number 9    Number of Visits 15   7 + 8   Date for PT Re-Evaluation 57/84/69   re-cert to allow for aquatic scheduling   Authorization Type [employer] dual/medicaid    PT Start Time 1449    PT Stop Time 1530    PT Time Calculation (min) 41 min    Equipment Utilized During Treatment Gait belt    Activity Tolerance Patient tolerated treatment well    Behavior During Therapy WFL for tasks assessed/performed             Past Medical History:  Diagnosis Date   Abscess of bursa, left elbow 06/2013   Treated with I and D/antibiotics.    Arthritis    Asthma    Depression    History of blood transfusion    "after I had one of my kids"   History of kidney stones    Hypertension    Ovarian cyst    Paralysis (HCC)    "no feeling from the waist down"   Schizophrenia (HCC)    Sickle cell disease ruled out    Patient with no evidence of Hgb S on eletrophoresis x3   Type II diabetes mellitus (HCC)    Past Surgical History:  Procedure Laterality Date   APPENDECTOMY  2013   BILIARY STENT PLACEMENT N/A 05/02/2018   Procedure: BILIARY STENT PLACEMENT;  Surgeon: Sherrilyn Rist, MD;  Location: [facility];  Service: Gastroenterology;  Laterality: N/A;   BOWEL RESECTION  12/03/2020   Procedure: SMALL BOWEL RESECTION;  Surgeon: Sheliah Hatch, De Blanch, MD;  Location[facility] OR;  Service: General;;   BRONCHIAL NEEDLE ASPIRATION BIOPSY  08/10/2023   Procedure: BRONCHIAL NEEDLE ASPIRATION BIOPSIES;  Surgeon: Josephine Igo, DO;  Location: [facility];  Service: Cardiopulmonary;;   CARPAL TUNNEL RELEASE     CESAREAN SECTION  2000; 2007; 2011   CHOLECYSTECTOMY N/A 04/03/2018   Procedure: LAPAROSCOPIC CHOLECYSTECTOMY;  Surgeon: Emelia Loron, MD;  Location: [facility];  Service: General;   Laterality: N/A;   DILATATION & CURETTAGE/HYSTEROSCOPY WITH MYOSURE N/A 10/22/2020   Procedure: DILATATION & CURETTAGE/HYSTEROSCOPY WITH MYOSURE;  Surgeon: Lavina Hamman, MD;  Location: [facility] OR;  Service: Gynecology;  Laterality: N/A;   DILATION AND CURETTAGE OF UTERUS     ERCP N/A 05/02/2018   Procedure: ENDOSCOPIC RETROGRADE CHOLANGIOPANCREATOGRAPHY (ERCP);  Surgeon: Sherrilyn Rist, MD;  Location: [facility];  Service: Gastroenterology;  Laterality: N/A;   ERCP N/A 07/26/2018   Procedure: ENDOSCOPIC RETROGRADE CHOLANGIOPANCREATOGRAPHY (ERCP);  Surgeon: Hilarie Fredrickson, MD;  Location: [facility];  Service: Endoscopy;  Laterality: N/A;  stent removal   IR RADIOLOGIST EVAL & MGMT  05/19/2018   LAMINECTOMY N/A 12/13/2014   Procedure: CERVICAL LAMINECTOMY FOR INTRADURAL TUMOR;  Surgeon: Temple Pacini, MD;  Location: [facility];  Service: Neurosurgery;  Laterality: N/A;  posterior   LAPAROSCOPY N/A 07/20/2017   Procedure: LAPAROSCOPY OPERATIVE WITH WEDGE RESECTION RIGHT CORNUA AND PARTIAL SALPINGECTOMY;  Surgeon: Reva Bores, MD;  Location: [facility] ORS;  Service: Gynecology;  Laterality: N/A;   REDUCTION MAMMAPLASTY Bilateral 1998   REMOVAL OF STONES  05/02/2018   Procedure: REMOVAL OF STONES;  Surgeon: Sherrilyn Rist, MD;  Location: [facility];  Service: Gastroenterology;;   Dennison Mascot  05/02/2018  Procedure: SPHINCTEROTOMY;  Surgeon: Sherrilyn Rist, MD;  Location: [facility];  Service: Gastroenterology;;   Francine Graven REMOVAL  07/26/2018   Procedure: STENT REMOVAL;  Surgeon: Hilarie Fredrickson, MD;  Location: [facility];  Service: Endoscopy;;   TOTAL LAPAROSCOPIC HYSTERECTOMY WITH SALPINGECTOMY Bilateral 12/03/2020   Procedure: ATTEMPTED  LAPAROSCOPIC, OPEN ABDOMINAL HYSTERECTOMY WITH BILATERAL SALPINGO-OOPHERECTOMY AND LYSIS OF ADHESIONS;  Surgeon: Lavina Hamman, MD;  Location: [facility] OR;  Service: Gynecology;  Laterality: Bilateral;   VIDEO BRONCHOSCOPY WITH ENDOBRONCHIAL ULTRASOUND Bilateral  08/10/2023   Procedure: VIDEO BRONCHOSCOPY WITH ENDOBRONCHIAL ULTRASOUND;  Surgeon: Josephine Igo, DO;  Location: [facility];  Service: Cardiopulmonary;  Laterality: Bilateral;   Patient Active Problem List   Diagnosis Date Noted   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   Obesity, Class III, BMI 40-49.9 (morbid obesity) (HCC) 07/14/2022   Hypokalemia 07/14/2022   UTI (urinary tract infection) 07/14/2022   AKI (acute kidney injury) (HCC) 07/09/2022   Acute low back pain 07/09/2022   Uterine leiomyoma 12/03/2020   S/P abdominal hysterectomy 12/03/2020   Bile leak, postoperative    Choledocholithiasis    Intra-abdominal fluid collection    Anemia 04/28/2018   Morbid obesity with body mass index (BMI) of 50.0 to 59.9 in adult [facility]) 04/02/2018   Mass of uterus 04/02/2018   Transaminitis 04/02/2018   Acute cholecystitis s/p lap cholecystectomy 04/03/2018 04/01/2018   Pregnancy, ectopic, cornual or cervical 07/21/2017   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   Neurogenic bladder 08/23/2015   Bilateral carpal tunnel syndrome 08/23/2015   Paraparesis of both lower limbs (HCC) 07/14/2015   Ileus, postoperative (HCC) 12/25/2014   Type 2 diabetes mellitus with obesity (HCC) 12/25/2014   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HCC) 12/24/2014   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   Ileus of unspecified type (HCC)    Atelectasis    Bowel obstruction (HCC)    Encounter for central line care    Ileus [facility])    Intestinal occlusion (HCC)    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   Headache 11/19/2014   Neck pain 11/19/2014   Right sided weakness 11/19/2014   Paresthesias 11/19/2014   Nightmares 09/11/2014   Essential hypertension 03/28/2014   Nephrolithiasis 03/27/2014   Type 2 diabetes mellitus without complication, with long-term current use of insulin (HCC) 03/27/2014   Abdominal pain 03/27/2014    PCP: Earlie Lou, MD  REFERRING PROVIDER: Danae Chen, MD  REFERRING DIAG: M54.2  (ICD-10-CM) - Cervicalgia   THERAPY DIAG:  Muscle weakness (generalized)  Abnormal posture  Other lack of coordination  Difficulty in walking, not elsewhere classified  Unsteadiness on feet  History of falling  Rationale for Evaluation and Treatment: Rehabilitation  ONSET DATE: 01/06/24 referral  SUBJECTIVE:  SUBJECTIVE STATEMENT: Patient arrives to clinic alone without AD.  She reports ongoing high pain level - she reports surgeon told her to continue PT for 3 months in order to keep moving.  Pain at current is 10/10.   Hand dominance: Right  PERTINENT HISTORY:  Arthritis, depression, HTN, paralysis, schizophrenia, DMII  PAIN:  Are you having pain? Yes: NPRS scale: 10/10 Pain location: from origin of incision to mid/low back all the way down BLE Pain description: burning, stabbing, aching Aggravating factors: none reported Relieving factors: none reported  PRECAUTIONS: Fall  RED FLAGS: Cervical red flags: Dysphagia No, Dysmetria No, Diplopia No, Nystagmus No, and Nausea No, Bowel or bladder incontinence: No, Spinal tumors: Yes: h/o, and Cauda equina syndrome: No     WEIGHT BEARING RESTRICTIONS: No  FALLS:  Has patient fallen in last 6 months? No  LIVING ENVIRONMENT: Lives with: lives with their family Lives in: House/apartment Stairs: No Has following equipment at home: Environmental consultant - 4 wheeled, Wheelchair (power), and Grab bars  PLOF: Needs assistance with ADLs and Needs assistance with homemaking  PATIENT GOALS: "I don't know what else to do"  OBJECTIVE:  Note: Objective measures were completed at Evaluation unless otherwise noted.  DIAGNOSTIC FINDINGS:  12/08/23 c-spine MRI IMPRESSION: 1. Postsurgical changes involving the posterior cord at the level of C4, stable  from previous. No findings to suggest recurrent tumor on this noncontrast examination. 2. Mild for age cervical spondylosis as above without significant stenosis or neural impingement.  PATIENT SURVEYS:  NDI 76%  COGNITION: Overall cognitive status: Within functional limits for tasks assessed  SENSATION: Light touch: Impaired  Hot/Cold: Impaired  Proprioception: Impaired   POSTURE: rounded shoulders and forward head  PALPATION: No trigger points, increased tension around c-spine incision   CERVICAL ROM:  ~50% limited by pain, most pain with R cervical rotation  UPPER EXTREMITY ROM: Guarded and painful  UPPER EXTREMITY MMT: Limited by pain  CERVICAL SPECIAL TESTS:  Spurling's test: Positive, Distraction test: Negative, and Sharp pursor's test: Negative  TREATMENT -Lengthy discussion about modest progress w/ PT and lack of adequate pain response with all techniques used both land and aquatic.  PT states we are still planning to discharge this visit as the goal for POC has always been self-management and accountability for maintaining activity as no status change has occurred and there are no new needs for AD.  Pt was given resources to look into pool locations as she reported she would do after last aquatic appt, but has not.  PT explained chronicity of deficits and need for lifelong movement to manage and maintain, but not needing skilled intervention during entire duration.  Discussed evidence for episodic care (2-3x a year in short bouts) vs lengthy POC for chronic disorders to allow time for improved self-efficacy.  Explained process of obtaining new referral for re-evaluation in 4-5 months (sooner if status change). -NDI:  39/50 = 78% -5xSTS:  26.88 sec w/ BUE support - no AD:  61'    PATIENT EDUCATION:  Education details: Continue land HEP as tolerated.  Will provide aquatic HEP at next visit.  Plan for discharge as above due to lack of progress towards goals.  Things  to continue focusing on to strengthen/manage pain at home: Discussed using topicals (as tolerated) vs medicines (as prescribed) vs PRICE principles to manage pain and edema.  Discussed prioritizing quality rest and sleep hygiene.  Focusing on adequate nutrition to fuel activity.  Encouraged her to walk daily even if only 2-3  minutes extra. Person educated: Patient Education method: Explanation Education comprehension: verbalized understanding and needs further education  HOME EXERCISE PROGRAM: Access Code: [email or web address] URL: [email or web address] Date: 02/04/2024 Prepared by: Camille Bal  Exercises - Prone Press Up On Elbows  - 1 x daily - 5 x weekly - 2 sets - 10 reps - Supine Bridge  - 1 x daily - 5 x weekly - 2 sets - 10 reps - Supine Cervical Retraction with Towel  - 1 x daily - 5 x weekly - 2 sets - 12 reps - 1-2 seconds hold - Supine Chin Tuck  - 1 x daily - 5 x weekly - 2 sets - 10 reps - Scapular retraction with resistance  - 1 x daily - 5 x weekly - 3 sets - 10 reps  -Chair yoga - poses established on 3/28 visit  ASSESSMENT:  CLINICAL IMPRESSION: Pt presents for final land appt.  She has recently seen neurosurgery and reports she is not a surgical candidate and her surgeon would like her to continue PT for 3 months.  PT informs her of decision making process and proceeding with goal assessment today to further determine benefit of ongoing skilled intervention vs discharge.  Based on severe regression in goal progress (no goals met this visit) and pt having been provided resources for known plan to transition to home management at end of current POC, PT proceeding with discharge at last scheduled pool visit.  Pt has been provided resources for pool options to continue activity as she is safe and independent in this environment.  She possibly could obtain a Medicaid voucher to access the [facility] if desired, but has previously expressed plans to pursue [facility]  center.  Pt verbalized understanding of aquatic HEP establishment at last scheduled visit, education provided today and plan to discharge.  Proceed with POC.  OBJECTIVE IMPAIRMENTS: Abnormal gait, decreased activity tolerance, decreased knowledge of condition, decreased mobility, decreased strength, hypomobility, increased fascial restrictions, impaired sensation, impaired UE functional use, improper body mechanics, postural dysfunction, and pain.   ACTIVITY LIMITATIONS: carrying, lifting, dressing, reach over head, hygiene/grooming, locomotion level, and caring for others  PARTICIPATION LIMITATIONS: meal prep, cleaning, driving, shopping, community activity, and occupation  PERSONAL FACTORS: Behavior pattern, Fitness, Past/current experiences, Time since onset of injury/illness/exacerbation, Transportation, and 3+ comorbidities: see above  are also affecting patient's functional outcome.   REHAB POTENTIAL: Fair chronicity, unknown etiology  CLINICAL DECISION MAKING: Stable/uncomplicated  EVALUATION COMPLEXITY: Moderate   GOALS: Goals reviewed with patient? Yes  SHORT TERM GOALS: Target date: 02/11/24  Pt will be independent with initial HEP for improved pain relief and posture  Baseline:  to be provided Goal status: INITIAL  2.  Patient will improve NDI score to </= 65% to demonstrate a reduction in pain impacting mobility  Baseline: 76% Goal status: INITIAL    LONG TERM GOALS: Target date: 03/10/24  Pt will be independent with final HEP for improved pain relief and mobility   Baseline: to be provided; Established initial land HEP (3/21) Goal status: IN PROGRESS  2.  Patient will improve NDI score to </= 55% to demonstrate a reduction in pain and improved mobility Baseline: 76%; 68% (3/21) Goal status: INITIAL  GOALS (re-cert 6/04): Goals reviewed with patient? Yes  SHORT TERM GOALS = LONG TERM GOALS: Target date: 03/17/24  Pt will be independent with finalized land and  aquatic HEP for improved pain relief and mobility.  Baseline: Established initial land HEP (3/21); aquatic HEP to be reviewed in  pool 4/17 (4/11) Goal status:  INITIAL  2.  Patient will improve NDI score to </= 55% to demonstrate a reduction in pain and improved mobility. Baseline: 76%; 68% (3/21); 78% (4/11) Goal status: NOT MET  3.  Pt will decrease 5xSTS to </=19.56 seconds in order to demonstrate decreased risk for falls and improved functional bilateral LE strength and power. Baseline: 24.56 sec w/ BUE support (3/21); 26.88 sec w/ BUE support (4/11) Goal status: NOT MET  4.  Pt will ambulate >/=553 feet on to demonstrate improved functional endurance for home and community participation. Baseline: 453 ft no AD (3/21); 373 ft no AD (4/11) Goal status: NOT MET  PLAN:  PT FREQUENCY: 1x/week + 2x/wk  PT DURATION: 6 weeks + 4 wks (re-cert 1/61)  PLANNED INTERVENTIONS: 09604- PT Re-evaluation, 97110-Therapeutic exercises, 97530- Therapeutic activity, 97112- Neuromuscular re-education, 97535- Self Care, 54098- Manual therapy, [phone]- Gait training, [phone]- Orthotic Fit/training, [phone]- Canalith repositioning, [id number]- Aquatic Therapy, [phone]- Electrical stimulation (unattended), [phone]- Electrical stimulation (manual), Patient/Family education, Balance training, Taping, Dry Needling, Joint mobilization, Spinal mobilization, Scar mobilization, Vestibular training, Visual/preceptual remediation/compensation, DME instructions, Cryotherapy, and Moist heat  PLAN FOR NEXT SESSION:   Aquatic HEP:  establish aquatic HEP - review!  Discharge note.  Sadie Haber, PT, DPT  03/10/2024, 4:09 PM

## 2024-03-16 ENCOUNTER — Ambulatory Visit: Admitting: Physical Therapy

## 2024-03-16 ENCOUNTER — Encounter: Payer: Self-pay | Admitting: Physical Therapy

## 2024-03-16 DIAGNOSIS — R278 Other lack of coordination: Secondary | ICD-10-CM

## 2024-03-16 DIAGNOSIS — M6281 Muscle weakness (generalized): Secondary | ICD-10-CM | POA: Diagnosis not present

## 2024-03-16 DIAGNOSIS — Z9181 History of falling: Secondary | ICD-10-CM

## 2024-03-16 DIAGNOSIS — R2681 Unsteadiness on feet: Secondary | ICD-10-CM

## 2024-03-16 DIAGNOSIS — R262 Difficulty in walking, not elsewhere classified: Secondary | ICD-10-CM

## 2024-03-16 DIAGNOSIS — R293 Abnormal posture: Secondary | ICD-10-CM

## 2024-03-16 NOTE — Patient Instructions (Signed)
 Access Code: [email or web address] URL: [email or web address] Date: 03/16/2024 Prepared by: Marilou Showman  Exercises - Standing Hip Abduction Adduction at [facility]  - 1 x daily - 7 x weekly - 3 sets - 10 reps - Standing Hip Flexion Extension at [employer]  - 1 x daily - 7 x weekly - 3 sets - 10 reps - Backward March  - 1 x daily - 7 x weekly - 3 sets - 10 reps - Forward March  - 1 x daily - 7 x weekly - 3 sets - 10 reps - Forward Backward Tandem Walking  - 1 x daily - 7 x weekly - 3 sets - 10 reps - Alternating Forward Lunge  - 1 x daily - 7 x weekly - 3 sets - 10 reps - Heel Toe Raises at Pool Wall  - 1 x daily - 7 x weekly - 3 sets - 10 reps - Flutter Kick at [employer]  - 1 x daily - 7 x weekly - 3 sets - 10 reps - Plank with Hip Extension at [employer]  - 1 x daily - 7 x weekly - 3 sets - 10 reps - Bilateral Shoulder Flexion Extension with Hand Floats at [employer]  - 1 x daily - 7 x weekly - 3 sets - 10 reps - Sitting Balance on Pool Noodle  - 1 x daily - 7 x weekly - 3 sets - 10 reps - Side to Side Hamstring Stretch with Noodle at [employer]  - 1 x daily - 7 x weekly - 3 sets - 10 reps - Forward and Backward Stepping at [employer]  - 1 x daily - 7 x weekly - 3 sets - 10 reps - Lateral Stepping at [employer]  - 1 x daily - 7 x weekly - 3 sets - 10 reps

## 2024-03-16 NOTE — Therapy (Signed)
 OUTPATIENT PHYSICAL THERAPY CERVICAL TREATMENT/10th Visit PN/DISCHARGE SUMMARY   Patient Name: Cynthia Rosales MRN: [id number] DOB:Apr 09, 1981, 43 y.o., female Today's Date: 03/16/2024  PT progress note for [employer].  Reporting period 01/14/2024 to 03/16/2024  See Note below for Objective Data and Assessment of Progress/Goals  Thank you for the referral of this patient. Camille Bal, PT, DPT  PHYSICAL THERAPY DISCHARGE SUMMARY  Visits from Start of Care: 10  Current functional level related to goals / functional outcomes: See clinical impression statement.   Remaining deficits: High chronic pain level   Education / Equipment: Continue land and aquatic HEP - focus on enjoyed movement like walking or other activity to improve consistency with activity.  Pain science education.  Pain management strategies.  Discharge today and process for returning in 4-5 months with new referral as needed.   Patient agrees to discharge. Patient goals were partially met. Patient is being discharged due to maximized rehab potential.    END OF SESSION:  PT End of Session - 03/16/24 1355     Visit Number 10    Number of Visits 15   7 + 8   Date for PT Re-Evaluation 96/29/52   re-cert to allow for aquatic scheduling   Authorization Type [employer] dual/medicaid    PT Start Time 1100    PT Stop Time 1155    PT Time Calculation (min) 55 min    Equipment Utilized During Treatment Other (comment)   floatation devices as needed   Activity Tolerance Patient tolerated treatment well    Behavior During Therapy WFL for tasks assessed/performed             Past Medical History:  Diagnosis Date   Abscess of bursa, left elbow 06/2013   Treated with I and D/antibiotics.    Arthritis    Asthma    Depression    History of blood transfusion    "after I had one of my kids"   History of kidney stones    Hypertension    Ovarian cyst    Paralysis (HCC)    "no feeling from the waist down"    Schizophrenia (HCC)    Sickle cell disease ruled out    Patient with no evidence of Hgb S on eletrophoresis x3   Type II diabetes mellitus (HCC)    Past Surgical History:  Procedure Laterality Date   APPENDECTOMY  2013   BILIARY STENT PLACEMENT N/A 05/02/2018   Procedure: BILIARY STENT PLACEMENT;  Surgeon: Sherrilyn Rist, MD;  Location: [facility];  Service: Gastroenterology;  Laterality: N/A;   BOWEL RESECTION  12/03/2020   Procedure: SMALL BOWEL RESECTION;  Surgeon: Sheliah Hatch, De Blanch, MD;  Location: [facility] OR;  Service: General;;   BRONCHIAL NEEDLE ASPIRATION BIOPSY  08/10/2023   Procedure: BRONCHIAL NEEDLE ASPIRATION BIOPSIES;  Surgeon: Josephine Igo, DO;  Location: [facility];  Service: Cardiopulmonary;;   CARPAL TUNNEL RELEASE     CESAREAN SECTION  2000; 2007; 2011   CHOLECYSTECTOMY N/A 04/03/2018   Procedure: LAPAROSCOPIC CHOLECYSTECTOMY;  Surgeon: Emelia Loron, MD;  Location: [facility] OR;  Service: General;  Laterality: N/A;   DILATATION & CURETTAGE/HYSTEROSCOPY WITH MYOSURE N/A 10/22/2020   Procedure: DILATATION & CURETTAGE/HYSTEROSCOPY WITH MYOSURE;  Surgeon: Lavina Hamman, MD;  Location: [facility] OR;  Service: Gynecology;  Laterality: N/A;   DILATION AND CURETTAGE OF UTERUS     ERCP N/A 05/02/2018   Procedure: ENDOSCOPIC RETROGRADE CHOLANGIOPANCREATOGRAPHY (ERCP);  Surgeon: Sherrilyn Rist, MD;  Location: [facility];  Service: Gastroenterology;  Laterality: N/A;   ERCP N/A 07/26/2018   Procedure: ENDOSCOPIC RETROGRADE CHOLANGIOPANCREATOGRAPHY (ERCP);  Surgeon: Tobin Forts, MD;  Location: [facility];  Service: Endoscopy;  Laterality: N/A;  stent removal   IR RADIOLOGIST EVAL & MGMT  05/19/2018   LAMINECTOMY N/A 12/13/2014   Procedure: CERVICAL LAMINECTOMY FOR INTRADURAL TUMOR;  Surgeon: Baruch Bosch, MD;  Location: [facility];  Service: Neurosurgery;  Laterality: N/A;  posterior   LAPAROSCOPY N/A 07/20/2017   Procedure: LAPAROSCOPY OPERATIVE WITH WEDGE RESECTION RIGHT  CORNUA AND PARTIAL SALPINGECTOMY;  Surgeon: Granville Layer, MD;  Location: [facility];  Service: Gynecology;  Laterality: N/A;   REDUCTION MAMMAPLASTY Bilateral 1998   REMOVAL OF STONES  05/02/2018   Procedure: REMOVAL OF STONES;  Surgeon: Albertina Hugger, MD;  Location: [facility];  Service: Gastroenterology;;   [address]  05/02/2018   Procedure: SPHINCTEROTOMY;  Surgeon: Albertina Hugger, MD;  Location: [facility];  Service: Gastroenterology;;   [place] Herald REMOVAL  07/26/2018   Procedure: STENT REMOVAL;  Surgeon: Tobin Forts, MD;  Location: [facility];  Service: Endoscopy;;   TOTAL LAPAROSCOPIC HYSTERECTOMY WITH SALPINGECTOMY Bilateral 12/03/2020   Procedure: ATTEMPTED  LAPAROSCOPIC, OPEN ABDOMINAL HYSTERECTOMY WITH BILATERAL SALPINGO-OOPHERECTOMY AND LYSIS OF ADHESIONS;  Surgeon: Cyd Dowse, MD;  Location: [facility] OR;  Service: Gynecology;  Laterality: Bilateral;   VIDEO BRONCHOSCOPY WITH ENDOBRONCHIAL ULTRASOUND Bilateral 08/10/2023   Procedure: VIDEO BRONCHOSCOPY WITH ENDOBRONCHIAL ULTRASOUND;  Surgeon: Prudy Brownie, DO;  Location: [facility];  Service: Cardiopulmonary;  Laterality: Bilateral;   Patient Active Problem List   Diagnosis Date Noted   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   Obesity, Class III, BMI 40-49.9 (morbid obesity) (HCC) 07/14/2022   Hypokalemia 07/14/2022   UTI (urinary tract infection) 07/14/2022   AKI (acute kidney injury) (HCC) 07/09/2022   Acute low back pain 07/09/2022   Uterine leiomyoma 12/03/2020   S/P abdominal hysterectomy 12/03/2020   Bile leak, postoperative    Choledocholithiasis    Intra-abdominal fluid collection    Anemia 04/28/2018   Morbid obesity with body mass index (BMI) of 50.0 to 59.9 in adult [facility]) 04/02/2018   Mass of uterus 04/02/2018   Transaminitis 04/02/2018   Acute cholecystitis s/p lap cholecystectomy 04/03/2018 04/01/2018   Pregnancy, ectopic, cornual or cervical 07/21/2017   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   Neurogenic bladder 08/23/2015    Bilateral carpal tunnel syndrome 08/23/2015   Paraparesis of both lower limbs (HCC) 07/14/2015   Ileus, postoperative (HCC) 12/25/2014   Type 2 diabetes mellitus with obesity (HCC) 12/25/2014   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HCC) 12/24/2014   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   Ileus of unspecified type (HCC)    Atelectasis    Bowel obstruction (HCC)    Encounter for central line care    Ileus [facility])    Intestinal occlusion (HCC)    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   Headache 11/19/2014   Neck pain 11/19/2014   Right sided weakness 11/19/2014   Paresthesias 11/19/2014   Nightmares 09/11/2014   Essential hypertension 03/28/2014   Nephrolithiasis 03/27/2014   Type 2 diabetes mellitus without complication, with long-term current use of insulin (HCC) 03/27/2014   Abdominal pain 03/27/2014    PCP: Sudie Ely, MD  REFERRING PROVIDER: Austin Blumenthal, MD  REFERRING DIAG: M54.2 (ICD-10-CM) - Cervicalgia   THERAPY DIAG:  Muscle weakness (generalized)  Abnormal posture  Other lack of coordination  Difficulty in walking, not elsewhere classified  History of falling  Unsteadiness on feet  Rationale for Evaluation and Treatment: Rehabilitation  ONSET DATE: 01/06/24 referral  SUBJECTIVE:                                                                                                                                                                                                         SUBJECTIVE STATEMENT: Patient arrives to [facility] early, alone without AD.  Received from hot tub at time of appt.  She reports ongoing high pain level.  Pain at current is 10/10.   Hand dominance: Right  PERTINENT HISTORY:  Arthritis, depression, HTN, paralysis, schizophrenia, DMII  PAIN:  Are you having pain? Yes: NPRS scale: 10/10 Pain location: from origin of incision to mid/low back all the way down BLE Pain description: burning, stabbing,  aching Aggravating factors: none reported Relieving factors: none reported  PRECAUTIONS: Fall  RED FLAGS: Cervical red flags: Dysphagia No, Dysmetria No, Diplopia No, Nystagmus No, and Nausea No, Bowel or bladder incontinence: No, Spinal tumors: Yes: h/o, and Cauda equina syndrome: No     WEIGHT BEARING RESTRICTIONS: No  FALLS:  Has patient fallen in last 6 months? No  LIVING ENVIRONMENT: Lives with: lives with their family Lives in: House/apartment Stairs: No Has following equipment at home: Environmental consultant - 4 wheeled, Wheelchair (power), and Grab bars  PLOF: Needs assistance with ADLs and Needs assistance with homemaking  PATIENT GOALS: "I don't know what else to do"  OBJECTIVE:  Note: Objective measures were completed at Evaluation unless otherwise noted.  DIAGNOSTIC FINDINGS:  12/08/23 c-spine MRI IMPRESSION: 1. Postsurgical changes involving the posterior cord at the level of C4, stable from previous. No findings to suggest recurrent tumor on this noncontrast examination. 2. Mild for age cervical spondylosis as above without significant stenosis or neural impingement.  PATIENT SURVEYS:  NDI 76%  COGNITION: Overall cognitive status: Within functional limits for tasks assessed  SENSATION: Light touch: Impaired  Hot/Cold: Impaired  Proprioception: Impaired   POSTURE: rounded shoulders and forward head  PALPATION: No trigger points, increased tension around c-spine incision   CERVICAL ROM:  ~50% limited by pain, most pain with R cervical rotation  UPPER EXTREMITY ROM: Guarded and painful  UPPER EXTREMITY MMT: Limited by pain  CERVICAL SPECIAL TESTS:  Spurling's test: Positive, Distraction test: Negative, and Sharp pursor's test: Negative  TREATMENT Reviewed aquatic HEP as below: Access Code: [email or web address] URL: [email or web address] Date: 03/16/2024 Prepared by: Camille Bal  Exercises - Standing Hip Abduction Adduction at [facility]  - 1 x  daily - 7 x weekly - 3 sets - 10 reps -  Standing Hip Flexion Extension at [employer]  - 1 x daily - 7 x weekly - 3 sets - 10 reps - Backward March  - 1 x daily - 7 x weekly - 3 sets - 10 reps - Forward March  - 1 x daily - 7 x weekly - 3 sets - 10 reps - Forward Backward Tandem Walking  - 1 x daily - 7 x weekly - 3 sets - 10 reps - Alternating Forward Lunge  - 1 x daily - 7 x weekly - 3 sets - 10 reps - Heel Toe Raises at Pool Wall  - 1 x daily - 7 x weekly - 3 sets - 10 reps - Flutter Kick at [employer]  - 1 x daily - 7 x weekly - 3 sets - 10 reps - Plank with Hip Extension at [employer]  - 1 x daily - 7 x weekly - 3 sets - 10 reps - Bilateral Shoulder Flexion Extension with Hand Floats at [employer]  - 1 x daily - 7 x weekly - 3 sets - 10 reps - Sitting Balance on Pool Noodle  - 1 x daily - 7 x weekly - 3 sets - 10 reps - Side to Side Hamstring Stretch with Noodle at [employer]  - 1 x daily - 7 x weekly - 3 sets - 10 reps - Forward and Backward Stepping at [employer]  - 1 x daily - 7 x weekly - 3 sets - 10 reps - Lateral Stepping at [employer]  - 1 x daily - 7 x weekly - 3 sets - 10 reps  We discussed various modifications to movement to increase and decrease challenge and help mitigate pain response like breaking into smaller sets and decreasing arc of motion.  Extensive discussion of ways to manage her pain at home - heat, stretching, moving out of the position when her cramping starts, utilizing deep pressure and weight bearing when having spasming if able.  Pain science education regarding immediate response to pain (she reports she freezes and screams out of fear when pain starts) - edu on how this upweights her body's interpretation of pain and utilizing diaphragmatic breathing and other strategies above as able to help self-regulate fear of pain and improve response over longer period of time.  Discussed goal with discharge today - be consistent (every other day) with movement to improve tolerance  and pain response.  Return in 4-5 months with new referral if feeling positive or negative status change and she has improved her consistency with activity.  PATIENT EDUCATION:  Education details: Continue land and aquatic HEP - focus on enjoyed movement like walking or other activity to improve consistency with activity.  Pain science education.  Pain management strategies.  Discharge today and process for returning in 4-5 months with new referral as needed. Person educated: Patient Education method: Explanation Education comprehension: verbalized understanding and needs further education  HOME EXERCISE PROGRAM: Access Code: [email or web address] URL: [email or web address] Date: 02/04/2024 Prepared by: Marilou Showman  Exercises - Prone Press Up On Elbows  - 1 x daily - 5 x weekly - 2 sets - 10 reps - Supine Bridge  - 1 x daily - 5 x weekly - 2 sets - 10 reps - Supine Cervical Retraction with Towel  - 1 x daily - 5 x weekly - 2 sets - 12 reps - 1-2 seconds hold - Supine Chin Tuck  - 1 x daily - 5 x weekly - 2  sets - 10 reps - Scapular retraction with resistance  - 1 x daily - 5 x weekly - 3 sets - 10 reps  -Chair yoga - poses established on 3/28 visit  Access Code: [email or web address] URL: [email or web address] Date: 03/16/2024 Prepared by: Camille Bal  Exercises - Standing Hip Abduction Adduction at [facility]  - 1 x daily - 7 x weekly - 3 sets - 10 reps - Standing Hip Flexion Extension at [employer]  - 1 x daily - 7 x weekly - 3 sets - 10 reps - Backward March  - 1 x daily - 7 x weekly - 3 sets - 10 reps - Forward March  - 1 x daily - 7 x weekly - 3 sets - 10 reps - Forward Backward Tandem Walking  - 1 x daily - 7 x weekly - 3 sets - 10 reps - Alternating Forward Lunge  - 1 x daily - 7 x weekly - 3 sets - 10 reps - Heel Toe Raises at Pool Wall  - 1 x daily - 7 x weekly - 3 sets - 10 reps - Flutter Kick at [employer]  - 1 x daily - 7 x weekly - 3 sets - 10 reps - Plank  with Hip Extension at [employer]  - 1 x daily - 7 x weekly - 3 sets - 10 reps - Bilateral Shoulder Flexion Extension with Hand Floats at [employer]  - 1 x daily - 7 x weekly - 3 sets - 10 reps - Sitting Balance on Pool Noodle  - 1 x daily - 7 x weekly - 3 sets - 10 reps - Side to Side Hamstring Stretch with Noodle at [employer]  - 1 x daily - 7 x weekly - 3 sets - 10 reps - Forward and Backward Stepping at [employer]  - 1 x daily - 7 x weekly - 3 sets - 10 reps - Lateral Stepping at [employer]  - 1 x daily - 7 x weekly - 3 sets - 10 reps  ASSESSMENT:  CLINICAL IMPRESSION: Pt presents for final aquatic appt today.  Reviewed finalized aquatic HEP with extensive modification education today.  Pt was encouraged to utilize pain management techniques, consistent activity, and pain science education to make improvements in home environment.  She is in agreement to discharge and verbalizes understanding of all education provided today.  She is to return if needed in 4-5 months for re-assessment of status change or ongoing needs.  Will proceed with discharge at this time.  OBJECTIVE IMPAIRMENTS: Abnormal gait, decreased activity tolerance, decreased knowledge of condition, decreased mobility, decreased strength, hypomobility, increased fascial restrictions, impaired sensation, impaired UE functional use, improper body mechanics, postural dysfunction, and pain.   ACTIVITY LIMITATIONS: carrying, lifting, dressing, reach over head, hygiene/grooming, locomotion level, and caring for others  PARTICIPATION LIMITATIONS: meal prep, cleaning, driving, shopping, community activity, and occupation  PERSONAL FACTORS: Behavior pattern, Fitness, Past/current experiences, Time since onset of injury/illness/exacerbation, Transportation, and 3+ comorbidities: see above  are also affecting patient's functional outcome.   REHAB POTENTIAL: Fair chronicity, unknown etiology  CLINICAL DECISION MAKING:  Stable/uncomplicated  EVALUATION COMPLEXITY: Moderate   GOALS: Goals reviewed with patient? Yes  SHORT TERM GOALS: Target date: 02/11/24  Pt will be independent with initial HEP for improved pain relief and posture  Baseline:  to be provided Goal status: INITIAL  2.  Patient will improve NDI score to </= 65% to demonstrate a reduction in pain impacting mobility  Baseline: 76% Goal status: INITIAL    LONG TERM GOALS: Target date: 03/10/24  Pt will be independent with final HEP for improved pain relief and mobility   Baseline: to be provided; Established initial land HEP (3/21) Goal status: IN PROGRESS  2.  Patient will improve NDI score to </= 55% to demonstrate a reduction in pain and improved mobility Baseline: 76%; 68% (3/21) Goal status: INITIAL  GOALS (re-cert 1/61): Goals reviewed with patient? Yes  SHORT TERM GOALS = LONG TERM GOALS: Target date: 03/17/24  Pt will be independent with finalized land and aquatic HEP for improved pain relief and mobility.  Baseline: Established initial land HEP (3/21); aquatic HEP to be reviewed in pool 4/17 (4/11); Reviewed and pt able to complete independently (4/17) Goal status:  MET  2.  Patient will improve NDI score to </= 55% to demonstrate a reduction in pain and improved mobility. Baseline: 76%; 68% (3/21); 78% (4/11) Goal status: NOT MET  3.  Pt will decrease 5xSTS to </=19.56 seconds in order to demonstrate decreased risk for falls and improved functional bilateral LE strength and power. Baseline: 24.56 sec w/ BUE support (3/21); 26.88 sec w/ BUE support (4/11) Goal status: NOT MET  4.  Pt will ambulate >/=553 feet on to demonstrate improved functional endurance for home and community participation. Baseline: 453 ft no AD (3/21); 373 ft no AD (4/11) Goal status: NOT MET  PLAN:  PT FREQUENCY: 1x/week + 2x/wk  PT DURATION: 6 weeks + 4 wks (re-cert 0/96)  PLANNED INTERVENTIONS: 04540- PT Re-evaluation,  97110-Therapeutic exercises, 97530- Therapeutic activity, 97112- Neuromuscular re-education, 97535- Self Care, 98119- Manual therapy, [phone]- Gait training, [phone]- Orthotic Fit/training, [phone]- Canalith repositioning, [id number]- Aquatic Therapy, [phone]- Electrical stimulation (unattended), [phone]- Electrical stimulation (manual), Patient/Family education, Balance training, Taping, Dry Needling, Joint mobilization, Spinal mobilization, Scar mobilization, Vestibular training, Visual/preceptual remediation/compensation, DME instructions, Cryotherapy, and Moist heat  PLAN FOR NEXT SESSION:   N/A  Earlean Glaze, PT, DPT  03/16/2024, 1:56 PM

## 2024-04-12 ENCOUNTER — Encounter: Payer: Self-pay | Admitting: Internal Medicine

## 2024-04-12 ENCOUNTER — Ambulatory Visit (INDEPENDENT_AMBULATORY_CARE_PROVIDER_SITE_OTHER): Admitting: Internal Medicine

## 2024-04-12 VITALS — BP 116/74 | HR 96 | Ht 63.0 in | Wt 337.0 lb

## 2024-04-12 DIAGNOSIS — Z87891 Personal history of nicotine dependence: Secondary | ICD-10-CM

## 2024-04-12 DIAGNOSIS — D860 Sarcoidosis of lung: Secondary | ICD-10-CM | POA: Diagnosis not present

## 2024-04-12 NOTE — Progress Notes (Signed)
 OV 05/21/2023-this not a hospital follow-up but had a new consult referred by the ER.  Subjective:  Patient ID: Cynthia Rosales, female , DOB: 1981-02-24 , age 43 y.o. , MRN: [id number] , ADDRESS: [address] [place] [address] PCP Davida Espy, MD Patient Care Team: Davida Espy, MD as PCP - General (Internal Medicine) Pleasant, Patterson Bora, RN as [facility] Care Management  This Provider for this visit: Treatment Team:  Attending Provider: Maire Scot, MD    05/21/2023 -   Chief Complaint  Patient presents with   Hospitalization Follow-up    Hosp. f/up fluid in lungs.     HPI Cynthia Rosales 43 y.o. -new consultation referred by ER physician Dr. Lind Repine.  Patient's history is provided by review of the medical records in the emergency department 05/19/2023.  Patient reports that for the last few weeks she has been having some left sided back pain that appears to be in the area of the left iliac crest based on her pointing to me.  She went to the emergency department.  They did a CT spine and abdomen.  This was all negative except CT chest showed mediastinal adenopathy.  It was a CT chest did not look for PE.  There is no D-dimer.  She does report 2 to 3 weeks history of shortness of breath with exertion relieved by rest.  She says she reported this to the emergency department but I am not sure.  Remotely she has a D-dimer that was elevated.  She says at baseline she has hot flashes since hysterectomy 2 years ago.  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  She does not know if she has a past history of sarcoid.  In fact she says she never heard about this diagnosis.  She recalls the ER doctor telling her that she has fluid in the lungs but in review of the records and personal visualization of the CT chest she has mediastinal adenopathy and the lung  parenchyma itself is clear there is some nodules reported in the lower lobe but I am not sure.  Last BNP check 2019.  No prior echo. Mild anemia 05/19/2023.  Sickle cell ruled out per chart review. Chemistry normal. CT Chest W Contrast  Result Date: 05/19/2023 CLINICAL DATA:  Assess for mediastinal adenopathy EXAM: CT CHEST WITH CONTRAST TECHNIQUE: Multidetector CT imaging of the chest was performed during intravenous contrast administration. RADIATION DOSE REDUCTION: This exam was performed according to the departmental dose-optimization program which includes automated exposure control, adjustment of the mA and/or kV according to patient size and/or use of iterative reconstruction technique. CONTRAST:  OMNIPAQUE  IOHEXOL  300 MG/ML  SOLN COMPARISON:  Same day CT of the abdomen and pelvis FINDINGS: Cardiovascular: Normal heart size. No pericardial effusion. Normal caliber thoracic aorta with no atherosclerotic disease. Mediastinum/Nodes: Esophagus thyroid are unremarkable. Enlarged mediastinal bilateral hilar lymph nodes. Reference right paratracheal lymph node measuring 1.9 cm on series 2 image 16 reference right hilar lymph node measuring 1.7 cm on image 29. Lungs/Pleura: Central airways are patent. Consolidation, pleural effusion or pneumothorax. Numerous scattered small solid pulmonary nodules. Largest is a 6 mm nodule of the right middle lobe located on series 4, image 71. Focal nodular pleural thickening of the left lower lobe seen on series 4 image 67-80. No pleural effusion or pneumothorax. Upper Abdomen: See  same day separately dictated CT of the abdomen and pelvis for discussion of findings below the diaphragm. Musculoskeletal: No chest wall abnormality. No acute or significant osseous findings. IMPRESSION: 1. Enlarged mediastinal and bilateral hilar lymph nodes, differential considerations include sarcoidosis, reactive adenopathy, or lymphoproliferative disease. Recommend clinical correlation  and short-term follow-up chest CT in 3 months. 2. Numerous scattered small solid pulmonary and focal nodular pleural thickening of the left lower lobe, findings are nonspecific but can be seen in the setting of sarcoidosis. Recommend attention on follow-up. Electronically Signed   By: Avelino Lek M.D.   On: 05/19/2023 09:25   CT Cervical Spine Wo Contrast  Result Date: 05/19/2023 CLINICAL DATA:  Neck pain. Prior cervical spine surgery. Prior C4 ependymoma resection. EXAM: CT CERVICAL SPINE WITHOUT CONTRAST TECHNIQUE: Multidetector CT imaging of the cervical spine was performed without intravenous contrast. Multiplanar CT image reconstructions were also generated. RADIATION DOSE REDUCTION: This exam was performed according to the departmental dose-optimization program which includes automated exposure control, adjustment of the mA and/or kV according to patient size and/or use of iterative reconstruction technique. COMPARISON:  09/25/2015 and prior cervical MRI from 09/30/2019 FINDINGS: Body habitus reduces diagnostic sensitivity and specificity. Alignment: Mild reversal the normal cervical lordosis. Skull base and vertebrae: Prior posterior decompression from C3 through the C5-6 level. Mild spurring and loss of articular space at the anterior C1-2 articulation. No fracture or acute bony findings. Soft tissues and spinal canal: Unremarkable Disc levels: Generally maintained intervertebral disc height with no findings of bony impingement in the cervical spine. Upper chest: Suspected mildly enlarged upper mediastinal adenopathy. Other: No supplemental non-categorized findings. IMPRESSION: 1. Prior posterior decompression from C3 through C5-6 associated with previous C4 pending bone more resection. 2. Mild reversal the normal cervical lordosis. 3. Suspected mildly enlarged upper mediastinal adenopathy. Chest CT is recommended for definitive assessment. Electronically Signed   By: Freida Jes M.D.   On:  05/19/2023 08:09   CT ABDOMEN PELVIS W CONTRAST  Result Date: 05/19/2023 CLINICAL DATA:  Left upper quadrant abdominal pain and left flank pain over the last 4 days EXAM: CT ABDOMEN AND PELVIS WITH CONTRAST TECHNIQUE: Multidetector CT imaging of the abdomen and pelvis was performed using the standard protocol following bolus administration of intravenous contrast. RADIATION DOSE REDUCTION: This exam was performed according to the departmental dose-optimization program which includes automated exposure control, adjustment of the mA and/or kV according to patient size and/or use of iterative reconstruction technique. CONTRAST:  OMNIPAQUE  IOHEXOL  300 MG/ML  SOLN COMPARISON:  07/10/2021 FINDINGS: Body habitus reduces diagnostic sensitivity and specificity. Lower chest: Unremarkable Hepatobiliary: Cholecystectomy. Common bile duct proximally 0.7 cm in diameter, likely a physiologic response to cholecystectomy. No focal hepatic parenchymal lesion identified. Pancreas: Unremarkable Spleen: Unremarkable Adrenals/Urinary Tract: Unremarkable Stomach/Bowel: Postoperative findings along left paracentral small bowel in the upper pelvis compatible with prior bowel resection. Prior appendectomy. Vascular/Lymphatic: Unremarkable Reproductive: Uterus absent.  Adnexa unremarkable. Other: No supplemental non-categorized findings. Musculoskeletal: Degenerative facet arthropathy on the right at L4-5. IMPRESSION: 1. No specific abnormality is identified to explain the patient's left upper quadrant and left flank pain. 2. Prior small bowel resection. 3. Degenerative facet arthropathy on the right at L4-5. Electronically Signed   By: Freida Jes M.D.   On: 05/19/2023 08:00    OV with APP 08/18/23  RACHELANNE BASSANO is a 43 y.o. female former  smoker with past medical history of asthma, type 2 diabetes.She is followed by Dr. Bertrum Brodie in the clinic and was referred  to Dr. Thelda Finney for biopsy to confirm possible underlying  sarcoidosis.   Synopsis 43 year old female, past medical history of asthma, type 2 diabetes. Patient is a never smoker. Patient is seen by Dr. Bertrum Brodie in clinic. Patient was referred for abnormal CT imaging. Patient was found to have enlarged hypermetabolic mediastinal and bilateral hilar adenopathy concerning for possible underlying sarcoidosis. Was referred August 2024 for consideration of bronchoscopy and biopsy. She underwent  VIDEO BRONCHOSCOPY WITH ENDOBRONCHIAL ULTRASOUND (Bilateral) BRONCHIAL NEEDLE ASPIRATION BIOPSIES on 08/10/2023. She is here today for post procedure follow up.    08/18/2023 Pt. Presents for follow up after video bronchoscopy with endobronchial ultrasound bronchial needle aspiration and biopsies on 08/10/2023.  Patient states she did well postprocedure.  She denies any hemoptysis, fever, discolored secretions, difficulty breathing, or anesthesia adverse reaction. We have reviewed the cytology of her biopsies.They confirm sarcoidosis. She has been  symptomatic. She has pain in her chest. She also has some shortness of breath. She is coughing up a lot of mucus. She states it ranges from green to yellow. She states she has been having this for about 2 months. Plan will be to refer back to Dr. Bertrum Brodie for management.As she is symptomatic, I will start prednisone  until she can be seen by Dr. Bertrum Brodie. She is diabetic. I have explained to her that her blood sugars will increase on the prednisone .  I have asked her to reach out to the practitioner who manages her blood sugars so that she can let them know she is on prednisone  for her sarcoidosis.  And help her manage blood sugars.  She understands she will need to check her blood sugars more frequently while on the prednisone . We discussed that she will need to have eye exams annually and that she will be referred to cardiology for an evaluation to ensure there is no cardiac sarcoid.  I explained it is not a bad idea to have annual  follow-up with cardiology to ensure that there is no cardiac involvement. I have also provided the patient with some patient education regarding what sarcoidosis is. There are no appointments with Dr. Bertrum Brodie until November 2024, as I am concerned about how patient will do on the prednisone  I will see her again in 2 weeks to ensure that she is doing well and to see if she has experienced some symptom relief with treatment.  Patient is very engaged and wants to manage her sarcoidosis well.  Test Results: 08/10/2023 Cytology  FINAL MICROSCOPIC DIAGNOSIS:  A. LYMPH NODE, STATION 7, FINE NEEDLE ASPIRATION:  - Granulomatous inflammation  - Lymphoid tissue present   B. LYMPH NODE, 4R, FINE NEEDLE ASPIRATION:  - No malignant cells identified  - Benign bronchial cells present  - No lymphoid tissue present    OV 09/02/2023  Subjective:  Patient ID: Cynthia Rosales, female , DOB: 1981-10-23 , age 64 y.o. , MRN: [id number] , ADDRESS: [address] [place] [address] [place] [address] PCP Davida Espy, MD Patient Care Team: Davida Espy, MD as PCP - General (Internal Medicine) Ple[place], Patterson Bora, RN as [facility] Care Management  This Provider for this visit: Treatment Team:  Attending Provider: Maire Scot, MD    09/02/2023 -   Chief Complaint  Patient presents with   Follow-up    Breathing is doing well. She would like a flu vaccine.      HPI Cynthia Rosales 43 y.o. 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  Started on prednisone  08/18/2023.  Suffers  from morbid obesity and also diabetes.  Returns for follow-up starting prednisone  08/18/2023 by Sallee Craw nurse practitioner.  She is now on 30 mg/day.  Steroids are causing hyperglycemia.  Primary care has initiated insulin  adjustments.  Sugars are better.  Cough is not resolved and she is feeling better.  She will have a flu shot today.  We discussed methotrexate  therapy is added on in case of  progression or refractory symptoms.  Discussed the side effects of prednisone .    OV 10/13/2023  Subjective:  Patient ID: Cynthia Rosales, female , DOB: January 06, 1981 , age 74 y.o. , MRN: [id number] , ADDRESS: [address] [place] [address] [place] [address] PCP Davida Espy, MD Patient Care Team: Davida Espy, MD as PCP - General (Internal Medicine) Ple[place], Patterson Bora, RN as [facility] Care Management  This Provider for this visit: Treatment Team:  Attending Provider: Maire Scot, MD    10/13/2023 -   Chief Complaint  Patient presents with   Follow-up    She has had night sweats and aches since she woke up this morning.      HPI Cynthia Rosales 43 y.o. 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  Started on prednisone  08/18/2023.  Suffers from morbid obesity and also diabetes.  She is here for follow-up just 1 month after the last visit.  The purpose of this visit is to see how she is doing with the prednisone  10 mg/day.  She was on 20 mg/day and this was causing significant problems controlling her blood sugar.  But it did help her sarcoidosis in terms of cough.  Therefore we wanted to see what she would be doing at 10 mg/day.  She feels her sugar is well-controlled.  This is based on subjective history.  The 10 mg/day is certainly controlling her sarcoid well because she does not have any cough.  She is feeling well overall except yesterday she overnight picked up some night sweats and chills.  She makes him some bodyaches as well and abdominal cramps.  This morning she got up and vomited.  There is no headache no fever no chills no rash no diarrhea.  She ate some tuna fish at home she also ate some broccoli and cheese but is all home-cooked food.  She says the food tasted fine.  Her son is Ms. school today because of asthma attack.  She denies any other upper respiratory viral symptoms.  Did indicate to her I do not know exactly what is going on  but will be open to getting some blood work done.  She is open to this idea.  She is up-to-date with the flu shot.    OV 04/12/2024  Subjective:  Patient ID: Cynthia Rosales, female , DOB: 01/20/1981 , age 61 y.o. , MRN: [id number] , ADDRESS: [address] [place] [address] PCP Davida Espy, MD Patient Care Team: Davida Espy, MD as PCP - General (Internal Medicine) Ple[place], Patterson Bora, RN as [facility] Care Management  This Provider for this visit: Treatment Team:  Attending Provider: Maire Scot, MD    04/12/2024 -   Chief Complaint  Patient presents with   Follow-up    Breathing is stable. No new co's.      HPI Cynthia Rosales 43 y.o. 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  Started on prednisone  08/18/2023.  Suffers from morbid obesity and also diabetes.  Returns for follow-up.  At the start of the visit she  stated that she wanted to quit taking steroids because of side effect profile.  I recommended she go down to 5 mg/day at that point in time she mentioned that she has been completely off prednisone  for the last few weeks after visiting cardiology.  She says the prednisone  is making her quite hungry and making her diabetes go out of control.  Current she is she is on insulin  and Mounjaro .  She really does not want to go on prednisone .  She says she is asymptomatic from a respiratory standpoint.  There is no skin rashes no palpitations there is no arthralgia or arthritis.  In addition September 2024 chest x-ray was clear.  Also, Interim Health status: No new complaints No new medical problems. No new surgeries. No ER visits. No Urgent care visits. No changes to medications     PFT      No data to display             LAB RESULTS last 96 hours No results found.       has a past medical history of Abscess of bursa, left elbow (06/2013), Arthritis, Asthma, Depression, History of blood transfusion, History  of kidney stones, Hypertension, Ovarian cyst, Paralysis (HCC), Schizophrenia (HCC), Sickle cell disease ruled out, and Type II diabetes mellitus (HCC).   reports that she has quit smoking. Her smoking use included cigarettes. She has never used smokeless tobacco.  Past Surgical History:  Procedure Laterality Date   APPENDECTOMY  2013   BILIARY STENT PLACEMENT N/A 05/02/2018   Procedure: BILIARY STENT PLACEMENT;  Surgeon: Albertina Hugger, MD;  Location: [facility];  Service: Gastroenterology;  Laterality: N/A;   BOWEL RESECTION  12/03/2020   Procedure: SMALL BOWEL RESECTION;  Surgeon: Dorrie Gaudier, Alphonso Aschoff, MD;  Location[facility] OR;  Service: General;;   BRONCHIAL NEEDLE ASPIRATION BIOPSY  08/10/2023   Procedure: BRONCHIAL NEEDLE ASPIRATION BIOPSIES;  Surgeon: Prudy Brownie, DO;  Location: [facility];  Service: Cardiopulmonary;;   CARPAL TUNNEL RELEASE     CESAREAN SECTION  2000; 2007; 2011   CHOLECYSTECTOMY N/A 04/03/2018   Procedure: LAPAROSCOPIC CHOLECYSTECTOMY;  Surgeon: Enid Harry, MD;  Location: [facility] OR;  Service: General;  Laterality: N/A;   DILATATION & CURETTAGE/HYSTEROSCOPY WITH MYOSURE N/A 10/22/2020   Procedure: DILATATION & CURETTAGE/HYSTEROSCOPY WITH MYOSURE;  Surgeon: Cyd Dowse, MD;  Location: [facility] OR;  Service: Gynecology;  Laterality: N/A;   DILATION AND CURETTAGE OF UTERUS     ERCP N/A 05/02/2018   Procedure: ENDOSCOPIC RETROGRADE CHOLANGIOPANCREATOGRAPHY (ERCP);  Surgeon: Albertina Hugger, MD;  Location: [facility];  Service: Gastroenterology;  Laterality: N/A;   ERCP N/A 07/26/2018   Procedure: ENDOSCOPIC RETROGRADE CHOLANGIOPANCREATOGRAPHY (ERCP);  Surgeon: Tobin Forts, MD;  Location: [facility];  Service: Endoscopy;  Laterality: N/A;  stent removal   IR RADIOLOGIST EVAL & MGMT  05/19/2018   LAMINECTOMY N/A 12/13/2014   Procedure: CERVICAL LAMINECTOMY FOR INTRADURAL TUMOR;  Surgeon: Baruch Bosch, MD;  Location: [facility];  Service: Neurosurgery;   Laterality: N/A;  posterior   LAPAROSCOPY N/A 07/20/2017   Procedure: LAPAROSCOPY OPERATIVE WITH WEDGE RESECTION RIGHT CORNUA AND PARTIAL SALPINGECTOMY;  Surgeon: Granville Layer, MD;  Location: [facility] ORS;  Service: Gynecology;  Laterality: N/A;   REDUCTION MAMMAPLASTY Bilateral 1998   REMOVAL OF STONES  05/02/2018   Procedure: REMOVAL OF STONES;  Surgeon: Albertina Hugger, MD;  Location: [facility];  Service: Gastroenterology;;   [address]  05/02/2018   Procedure: [address];  Surgeon: Dominic Friendly,  Cordelia Dessert, MD;  Location: [facility];  Service: Gastroenterology;;   Yuvonne Herald REMOVAL  07/26/2018   Procedure: STENT REMOVAL;  Surgeon: Tobin Forts, MD;  Location: [facility];  Service: Endoscopy;;   TOTAL LAPAROSCOPIC HYSTERECTOMY WITH SALPINGECTOMY Bilateral 12/03/2020   Procedure: ATTEMPTED  LAPAROSCOPIC, OPEN ABDOMINAL HYSTERECTOMY WITH BILATERAL SALPINGO-OOPHERECTOMY AND LYSIS OF ADHESIONS;  Surgeon: Cyd Dowse, MD;  Location: [facility] OR;  Service: Gynecology;  Laterality: Bilateral;   VIDEO BRONCHOSCOPY WITH ENDOBRONCHIAL ULTRASOUND Bilateral 08/10/2023   Procedure: VIDEO BRONCHOSCOPY WITH ENDOBRONCHIAL ULTRASOUND;  Surgeon: Prudy Brownie, DO;  Location: [facility];  Service: Cardiopulmonary;  Laterality: Bilateral;    Allergies  Allergen Reactions   Amoxicillin  Hives, Rash and Other (See Comments)    PATIENT HAS HAD A PCN REACTION WITH IMMEDIATE RASH, FACIAL/TONGUE/THROAT SWELLING, SOB, OR LIGHTHEADEDNESS WITH HYPOTENSION:  #  #  YES  #  #  Has patient had a PCN reaction causing severe rash involving mucus membranes or skin necrosis: No Has patient had a PCN reaction that required hospitalization: No  Has patient had a PCN reaction occurring within the last 10 years: #  #  #  YES  #  #  #  If all of the above answers are "NO", then may proceed with Cephalosporin use.     Buprenorphine Hcl Itching and Nausea Only   Morphine  And Codeine Itching and Nausea Only    Immunization  History  Administered Date(s) Administered   Influenza, Seasonal, Injecte, Preservative Fre 09/02/2023    Family History  Problem Relation Age of Onset   Diabetes Mother    Hypertension Mother    Breast cancer Maternal Aunt    Ovarian cancer Maternal Grandmother    Breast cancer Maternal Aunt    Migraines Neg Hx      Current Outpatient Medications:    Continuous Glucose Receiver (FREESTYLE LIBRE 2 READER) DEVI, by Does not apply route every 14 (fourteen) days., Disp: , Rfl:    Continuous Glucose Sensor (FREESTYLE LIBRE 2 PLUS SENSOR) MISC, by Does not apply route every 14 (fourteen) days., Disp: , Rfl:    diazepam  (VALIUM ) 5 MG tablet, Take 5 mg by mouth 2 (two) times daily., Disp: , Rfl:    estradiol (CLIMARA - DOSED IN MG/24 HR) 0.075 mg/24hr patch, Place 0.075 mg onto the skin once a week., Disp: , Rfl:    furosemide  (LASIX ) 40 MG tablet, Take 40 mg by mouth daily., Disp: , Rfl:    HUMALOG  MIX 75/25 KWIKPEN (75-25) 100 UNIT/ML KwikPen, Inject 24 Units into the skin every morning., Disp: , Rfl:    hydrochlorothiazide  (HYDRODIURIL ) 25 MG tablet, Take 25 mg by mouth daily. , Disp: , Rfl:    Insulin  Lispro Prot & Lispro (HUMALOG  MIX 75/25 KWIKPEN) (75-25) 100 UNIT/ML Kwikpen, Inject 14 Units into the skin at bedtime., Disp: , Rfl:    lidocaine  (LIDODERM ) 5 %, Place 1 patch onto the skin as needed., Disp: , Rfl:    MOVANTIK  25 MG TABS tablet, Take 25 mg by mouth daily., Disp: , Rfl:    oxycodone  (ROXICODONE ) 30 MG immediate release tablet, Take 30 mg by mouth every 4 (four) hours as needed for pain., Disp: , Rfl:    predniSONE  (DELTASONE ) 10 MG tablet, Take 1 tablet (10 mg total) by mouth daily with breakfast., Disp: 30 tablet, Rfl: 2   sitaGLIPtin (JANUVIA) 100 MG tablet, Take 100 mg by mouth daily., Disp: , Rfl:    tirzepatide  (MOUNJARO ) 5 MG/0.5ML Pen, Inject 5  mg into the fatty tissue every 7 (seven) days for diabetes, Disp: 2 mL, Rfl: 1      Objective:   Vitals:   04/12/24  1335  BP: 116/74  Pulse: 96  SpO2: 97%  Weight: (!) 337 lb (152.9 kg)  Height: 5\' 3"  (1.6 m)    Estimated body mass index is 59.7 kg/m as calculated from the following:   Height as of this encounter: 5\' 3"  (1.6 m).   Weight as of this encounter: 337 lb (152.9 kg).  @WEIGHTCHANGE @  [employer]   04/12/24 1335  Weight: (!) 337 lb (152.9 kg)     Physical Exam   General: No distress. Looks well O2 at rest: no Cane present: no Sitting in wheel chair: no Frail: no Obese: YES Neuro: Alert and Oriented x 3. GCS 15. Speech normal Psych: Pleasant Resp:  Barrel Chest - no.  Wheeze - no, Crackles - no, No overt respiratory distress CVS: Normal heart sounds. Murmurs - no Ext: Stigmata of Connective Tissue Disease - no HEENT: Normal upper airway. PEERL +. No post nasal drip        Assessment:       ICD-10-CM   1. Pulmonary sarcoidosis (HCC)  D86.0          Plan:     Patient Instructions  Pulmonary sarcoidosis [facility]) -diagnosis made September 2024 based on mediastinal adenopathy Chronic cough Current chronic use of systemic steroids Encoubter therapeutic monitoring  - Sarcoid symptoms seem to be doing well on 10 mg prednisone  daily and stopped late April 2025 on opwn and still doing well 04/12/2024\   - noted desire not to restart prednisone  because of side effects esp with sugars - CXR clear sept 2024  Plan -MONITOR OFF PREDNISONE  -Hold off on starting methotrexate  for the moment    Follow-up - 5 months with APP to ensure life without steroids is going well   FOLLOWUP Return in about 5 months (around 09/12/2024) for with any of the APPS, Face to Face Visit.    SIGNATURE    Dr. Maire Scot, M.D., F.C.C.P,  Pulmonary and Critical Care Medicine Staff Physician, [facility] Center Director - Interstitial Lung Disease  Program  Pulmonary Fibrosis [facility] at [facility] [place], [place], [address]  Pager: [phone], If no answer or between  15:00h - 7:00h: call [phone] Telephone: [phone]  1:58 PM 04/12/2024

## 2024-04-12 NOTE — Patient Instructions (Addendum)
 Pulmonary sarcoidosis [facility]) -diagnosis made September 2024 based on mediastinal adenopathy Chronic cough Current chronic use of systemic steroids Encoubter therapeutic monitoring  - Sarcoid symptoms seem to be doing well on 10 mg prednisone  daily and stopped late April 2025 on opwn and still doing well 04/12/2024\   - noted desire not to restart prednisone  because of side effects esp with sugars - CXR clear sept 2024  Plan -MONITOR OFF PREDNISONE  -Hold off on starting methotrexate  for the moment    Follow-up - 5 months with APP to ensure life without steroids is going well

## 2024-07-18 ENCOUNTER — Emergency Department (HOSPITAL_COMMUNITY)

## 2024-07-18 ENCOUNTER — Encounter (HOSPITAL_COMMUNITY): Payer: Self-pay

## 2024-07-18 ENCOUNTER — Emergency Department (HOSPITAL_COMMUNITY)
Admission: EM | Admit: 2024-07-18 | Discharge: 2024-07-18 | Disposition: A | Discharge status: Home / Self Care | Attending: Emergency Medicine | Admitting: Emergency Medicine

## 2024-07-18 ENCOUNTER — Other Ambulatory Visit: Payer: Self-pay

## 2024-07-18 DIAGNOSIS — I10 Essential (primary) hypertension: Secondary | ICD-10-CM | POA: Insufficient documentation

## 2024-07-18 DIAGNOSIS — R059 Cough, unspecified: Secondary | ICD-10-CM | POA: Insufficient documentation

## 2024-07-18 DIAGNOSIS — G8220 Paraplegia, unspecified: Secondary | ICD-10-CM | POA: Insufficient documentation

## 2024-07-18 DIAGNOSIS — R0602 Shortness of breath: Secondary | ICD-10-CM | POA: Diagnosis present

## 2024-07-18 DIAGNOSIS — R079 Chest pain, unspecified: Secondary | ICD-10-CM

## 2024-07-18 DIAGNOSIS — R112 Nausea with vomiting, unspecified: Secondary | ICD-10-CM

## 2024-07-18 DIAGNOSIS — Z7985 Long-term (current) use of injectable non-insulin antidiabetic drugs: Secondary | ICD-10-CM | POA: Insufficient documentation

## 2024-07-18 DIAGNOSIS — D62 Acute posthemorrhagic anemia: Secondary | ICD-10-CM | POA: Diagnosis not present

## 2024-07-18 DIAGNOSIS — Z85848 Personal history of malignant neoplasm of other parts of nervous tissue: Secondary | ICD-10-CM | POA: Diagnosis not present

## 2024-07-18 DIAGNOSIS — R42 Dizziness and giddiness: Secondary | ICD-10-CM | POA: Diagnosis present

## 2024-07-18 DIAGNOSIS — Z7984 Long term (current) use of oral hypoglycemic drugs: Secondary | ICD-10-CM | POA: Insufficient documentation

## 2024-07-18 DIAGNOSIS — W19XXXA Unspecified fall, initial encounter: Secondary | ICD-10-CM

## 2024-07-18 DIAGNOSIS — R109 Unspecified abdominal pain: Secondary | ICD-10-CM | POA: Diagnosis not present

## 2024-07-18 DIAGNOSIS — Z794 Long term (current) use of insulin: Secondary | ICD-10-CM | POA: Insufficient documentation

## 2024-07-18 DIAGNOSIS — E119 Type 2 diabetes mellitus without complications: Secondary | ICD-10-CM | POA: Insufficient documentation

## 2024-07-18 DIAGNOSIS — M546 Pain in thoracic spine: Secondary | ICD-10-CM

## 2024-07-18 LAB — TROPONIN I (HIGH SENSITIVITY)
Troponin I (High Sensitivity): 7 ng/L (ref <18)
Troponin I (High Sensitivity): 6 ng/L (ref <18)

## 2024-07-18 LAB — URINALYSIS, ROUTINE W REFLEX MICROSCOPIC
Bilirubin Urine: NEGATIVE
Glucose, UA: NEGATIVE mg/dL
Hgb urine dipstick: NEGATIVE
Ketones, ur: NEGATIVE mg/dL
Leukocytes,Ua: NEGATIVE
Nitrite: NEGATIVE
Protein, ur: NEGATIVE mg/dL
Specific Gravity, Urine: 1.01 (ref 1.005–1.030)
pH: 6 (ref 5.0–8.0)

## 2024-07-18 LAB — CBC
HCT: 21.4 % — ABNORMAL LOW (ref 36.0–46.0)
HCT: 36.2 % (ref 36.0–46.0)
Hemoglobin: 6.6 g/dL — CL (ref 12.0–15.0)
Hemoglobin: 11.7 g/dL — ABNORMAL LOW (ref 12.0–15.0)
MCH: 24.1 pg — ABNORMAL LOW (ref 26.0–34.0)
MCH: 24.4 pg — ABNORMAL LOW (ref 26.0–34.0)
MCHC: 30.8 g/dL (ref 30.0–36.0)
MCHC: 32.3 g/dL (ref 30.0–36.0)
MCV: 78.1 fL — ABNORMAL LOW (ref 80.0–100.0)
MCV: 75.6 fL — ABNORMAL LOW (ref 80.0–100.0)
Platelets: 180 K/uL (ref 150–400)
Platelets: 307 K/uL (ref 150–400)
RBC: 2.74 MIL/uL — ABNORMAL LOW (ref 3.87–5.11)
RBC: 4.79 MIL/uL (ref 3.87–5.11)
RDW: 16.7 % — ABNORMAL HIGH (ref 11.5–15.5)
RDW: 16.5 % — ABNORMAL HIGH (ref 11.5–15.5)
WBC: 4.7 K/uL (ref 4.0–10.5)
WBC: 8 K/uL (ref 4.0–10.5)
nRBC: 0 % (ref 0.0–0.2)
nRBC: 0 % (ref 0.0–0.2)

## 2024-07-18 LAB — COMPREHENSIVE METABOLIC PANEL WITH GFR
ALT: 23 U/L (ref 0–44)
AST: 22 U/L (ref 15–41)
Albumin: 2.9 g/dL — ABNORMAL LOW (ref 3.5–5.0)
Alkaline Phosphatase: 72 U/L (ref 38–126)
Anion gap: 9 (ref 5–15)
BUN: 15 mg/dL (ref 6–20)
CO2: 24 mmol/L (ref 22–32)
Calcium: 8.8 mg/dL — ABNORMAL LOW (ref 8.9–10.3)
Chloride: 104 mmol/L (ref 98–111)
Creatinine, Ser: 0.88 mg/dL (ref 0.44–1.00)
GFR, Estimated: >60 mL/min (ref >60)
Glucose, Bld: 197 mg/dL — ABNORMAL HIGH (ref 70–99)
Potassium: 3.1 mmol/L — ABNORMAL LOW (ref 3.5–5.1)
Sodium: 137 mmol/L (ref 135–145)
Total Bilirubin: 0.4 mg/dL (ref 0.0–1.2)
Total Protein: 6.4 g/dL — ABNORMAL LOW (ref 6.5–8.1)

## 2024-07-18 LAB — BASIC METABOLIC PANEL WITH GFR
Anion gap: 8 (ref 5–15)
BUN: 15 mg/dL (ref 6–20)
CO2: 25 mmol/L (ref 22–32)
Calcium: 8.7 mg/dL — ABNORMAL LOW (ref 8.9–10.3)
Chloride: 103 mmol/L (ref 98–111)
Creatinine, Ser: 0.93 mg/dL (ref 0.44–1.00)
GFR, Estimated: >60 mL/min (ref >60)
Glucose, Bld: 201 mg/dL — ABNORMAL HIGH (ref 70–99)
Potassium: 3.1 mmol/L — ABNORMAL LOW (ref 3.5–5.1)
Sodium: 136 mmol/L (ref 135–145)

## 2024-07-18 LAB — LIPASE, BLOOD: Lipase: 27 U/L (ref 11–51)

## 2024-07-18 LAB — RESP PANEL BY RT-PCR (RSV, FLU A&B, COVID)  RVPGX2
Influenza A by PCR: NEGATIVE
Influenza B by PCR: NEGATIVE
Resp Syncytial Virus by PCR: NEGATIVE
SARS Coronavirus 2 by RT PCR: NEGATIVE

## 2024-07-18 LAB — TYPE AND SCREEN
ABO/RH(D): O POS
Antibody Screen: NEGATIVE

## 2024-07-18 MED ORDER — HYDROMORPHONE HCL 1 MG/ML IJ SOLN
1.0000 mg | Freq: Once | INTRAMUSCULAR | Status: AC
  Administered 2024-07-18: 1 mg via INTRAVENOUS
  Filled 2024-07-18: qty 1

## 2024-07-18 MED ORDER — METOCLOPRAMIDE HCL 5 MG/ML IJ SOLN
10.0000 mg | Freq: Once | INTRAMUSCULAR | Status: AC
  Administered 2024-07-18: 10 mg via INTRAVENOUS
  Filled 2024-07-18: qty 2

## 2024-07-18 MED ORDER — FENTANYL CITRATE PF 50 MCG/ML IJ SOSY
50.0000 ug | PREFILLED_SYRINGE | Freq: Once | INTRAMUSCULAR | Status: AC
  Administered 2024-07-18: 50 ug via INTRAVENOUS
  Filled 2024-07-18: qty 1

## 2024-07-18 MED ORDER — IOHEXOL 350 MG/ML SOLN
100.0000 mL | Freq: Once | INTRAVENOUS | Status: AC | PRN
  Administered 2024-07-18: 100 mL via INTRAVENOUS

## 2024-07-18 MED ORDER — FENTANYL CITRATE PF 50 MCG/ML IJ SOSY
100.0000 ug | PREFILLED_SYRINGE | Freq: Once | INTRAMUSCULAR | Status: AC
  Administered 2024-07-18: 100 ug via INTRAVENOUS
  Filled 2024-07-18: qty 2

## 2024-07-18 MED ORDER — ONDANSETRON HCL 4 MG/2ML IJ SOLN
4.0000 mg | Freq: Once | INTRAMUSCULAR | Status: AC
  Administered 2024-07-18: 4 mg via INTRAVENOUS
  Filled 2024-07-18: qty 2

## 2024-07-18 NOTE — ED Notes (Signed)
 Pt reports that she has called a ride for discharge.

## 2024-07-18 NOTE — ED Provider Notes (Signed)
 Sign out given from Cynthia Eck, PA-C.  In short patient is a 43 year old female with history of diabetes, hypertension, lower extremity paralysis who presents to the ED with complaints of chest pain associated with nausea, back pain, vomiting, cough and dizziness x 3 days.  Patient has a history of spinal cancer and is concerned for recurrence.  She is without any fevers, chills, no abdominal pain, headache or vision changes.  Troponin without elevation, lipase within normal limits, CMP with mild hypokalemia, mild hypoalbuminemia,BMP without significant abnormality.  Initial CBC was notable for hemoglobin of 6.6, however repeat demonstrated hemoglobin of 11.7.  EKG sinus rhythm without ischemic changes.  At time of signout patient's CT imaging is pending.  Physical Exam  BP (!) 144/94 (BP Location: Left Arm)   Pulse 88   Temp 97.8 F (36.6 C) (Oral)   Resp 18   LMP 03/21/2020   SpO2 100%   Physical Exam Vitals and nursing note reviewed.  Constitutional:      General: She is not in acute distress.    Appearance: She is well-developed.  HENT:     Head: Normocephalic and atraumatic.  Eyes:     Conjunctiva/sclera: Conjunctivae normal.  Cardiovascular:     Rate and Rhythm: Normal rate and regular rhythm.     Heart sounds: No murmur heard. Pulmonary:     Effort: Pulmonary effort is normal. No respiratory distress.     Breath sounds: Normal breath sounds.  Abdominal:     Palpations: Abdomen is soft.     Tenderness: There is abdominal tenderness.     Comments: Mild generalized abdominal tenderness, soft nondistended  Musculoskeletal:        General: No swelling.     Cervical back: Neck supple.  Skin:    General: Skin is warm and dry.     Capillary Refill: Capillary refill takes less than 2 seconds.  Neurological:     Mental Status: She is alert.     Comments: Patient is alert and oriented. There is no abnormal phonation. Symmetric smile without facial droop.  5/5 strength in upper  extremities.  Lower extremities with baseline paralysis.  There is no nystagmus. EOMI, PERRL. Coordination intact with finger to nose   Psychiatric:        Mood and Affect: Mood normal.     Procedures  Procedures  ED Course / MDM   Clinical Course as of 07/18/24 0936  Tue Jul 18, 2024  9266 Patient reevaluated.  She continues to complain of chest pain.  She has no cardiac history or prior blood clots.  She is not on blood thinners.  Pain is worse with respirations.  She does endorse a cough without other URI symptoms.  She reports persistent nausea, has had vomiting without diarrhea.  Has generalized abdominal tenderness on exam.  Last bowel movement was 4 days ago.  Has significant abdominal history including cholecystectomy, appendectomy, hysterectomy.  Still passing flatus.  Dizziness is intermittent, described as near syncope, worse with movement. [JT]  0808 CT imaging without evidence of PE or significant abdominal acute abnormality.  Spinal imaging without evidence of acute fractures or interval changes.  Multilevel degenerative changes noted. [JT]  0813 Chest x-ray with possible hazy opacity.  CT imaging without evidence of pneumonia.  She is additionally hemodynamically stable, without leukocytosis and afebrile.  Do not suspect pneumonia at this time.  Overall workup reassuring without significant abnormality noted.  [JT]  [id number] Discussed workup with patient including incidental findings noted on  CT chest and abdomen.  She will require follow-up outpatient GYN imaging given her history of cancer.  Discussed initial low hemoglobin with patient with repeat in normal range.  She has no history of GI bleed.  She is not on blood thinners.  She denies any hematochezia or melena.  Low suspicion for true hemoglobin in the 6 range.  Recommended to perform Hemoccult however patient declines.  Patient hemodynamically stable.  Tolerating p.o.  She will be discharged home with strict return precautions  and PCP follow-up. [JT]    Clinical Course User Index [JT] Donnajean Lynwood DEL, PA-C   Medical Decision Making Amount and/or Complexity of Data Reviewed Labs: ordered. Radiology: ordered.  Risk Prescription drug management.         Donnajean Lynwood DEL, PA-C 07/18/24 9063    Melvenia Motto, MD 07/19/24 [phone]

## 2024-07-18 NOTE — ED Triage Notes (Addendum)
 Pt. Arrives for chest pain x3 days. Reports dizziness, nausea and a nonproductive cough. Hx of spinal cancer. Denies SOB given 4mg  zofran  IV and 1L LR by [email or web address]

## 2024-07-18 NOTE — Discharge Instructions (Addendum)
 You were evaluated in the emergency room following a fall.  Your lab work and imaging did not show any new acute abnormality.  There were some nodules in your chest and abdomen that were noted.  Recommend follow-up CT chest and abdominal MRI with your PCP.  Please follow-up with your primary care doctor regarding your ER visit.  If you experience any new or worsening symptoms please return the emergency room.

## 2024-07-18 NOTE — ED Provider Notes (Signed)
 [name] EMERGENCY DEPARTMENT AT [facility] Provider Note   CSN: [id number] Arrival date & time: 07/18/24  9672     Patient presents with: Chest Pain   Cynthia Rosales is a 43 y.o. female past medical history significant for diabetes, paralysis, and hypertension presents today for chest pain that radiates through to her back, nausea, back pain, vomiting, cough, and dizziness times approximately 3 days.  Patient has a history of spinal cancer and is concerned she has something wrong in her thoracic spine.  Patient denies fever, chills, diarrhea, abdominal pain, headache diplopia, or tinnitus.    Chest Pain Associated symptoms: cough, dizziness, nausea and vomiting        Prior to Admission medications   Medication Sig Start Date End Date Taking? Authorizing Provider  Continuous Glucose Receiver (FREESTYLE LIBRE 2 READER) DEVI by Does not apply route every 14 (fourteen) days.    [provider]  Continuous Glucose Sensor (FREESTYLE LIBRE 2 PLUS SENSOR) MISC by Does not apply route every 14 (fourteen) days.    [provider]  diazepam  (VALIUM ) 5 MG tablet Take 5 mg by mouth 2 (two) times daily. 08/30/23   [provider]  estradiol (CLIMARA - DOSED IN MG/24 HR) 0.075 mg/24hr patch Place 0.075 mg onto the skin once a week. 06/12/22   [provider]  furosemide  (LASIX ) 40 MG tablet Take 40 mg by mouth daily. 04/15/22   [provider]  HUMALOG  MIX 75/25 KWIKPEN (75-25) 100 UNIT/ML KwikPen Inject 24 Units into the skin every morning. 04/07/22   [provider]  hydrochlorothiazide  (HYDRODIURIL ) 25 MG tablet Take 25 mg by mouth daily.  04/27/18   [provider]  Insulin  Lispro Prot & Lispro (HUMALOG  MIX 75/25 KWIKPEN) (75-25) 100 UNIT/ML Kwikpen Inject 14 Units into the skin at bedtime.    [provider]  lidocaine  (LIDODERM ) 5 % Place 1 patch onto the skin as needed. 09/07/23   [provider]   MOVANTIK  25 MG TABS tablet Take 25 mg by mouth daily. 09/28/23   [provider]  oxycodone  (ROXICODONE ) 30 MG immediate release tablet Take 30 mg by mouth every 4 (four) hours as needed for pain. 01/02/16   [provider]  predniSONE  (DELTASONE ) 10 MG tablet Take 1 tablet (10 mg total) by mouth daily with breakfast. 02/17/24   [name], MD  sitaGLIPtin (JANUVIA) 100 MG tablet Take 100 mg by mouth daily.    [provider]  tirzepatide  (MOUNJARO ) 5 MG/0.5ML Pen Inject 5 mg into the fatty tissue every 7 (seven) days for diabetes 07/02/23       Allergies: Amoxicillin , Buprenorphine hcl, and Morphine  and codeine    Review of Systems  Respiratory:  Positive for cough.   Cardiovascular:  Positive for chest pain.  Gastrointestinal:  Positive for nausea and vomiting.  Musculoskeletal:  Positive for arthralgias.  Neurological:  Positive for dizziness.    Updated Vital Signs BP (!) 138/93   Pulse 100   Temp 97.8 F (36.6 C)   Resp 13   LMP 03/21/2020   SpO2 99%   Physical Exam  (all labs ordered are listed, but only abnormal results are displayed) Labs Reviewed  BASIC METABOLIC PANEL WITH GFR - Abnormal; Notable for the following components:      Result Value   Potassium 3.1 (*)    Glucose, Bld 201 (*)    Calcium 8.7 (*)    All other components within normal limits  CBC -  Abnormal; Notable for the following components:   RBC 2.74 (*)    Hemoglobin 6.6 (*)    HCT 21.4 (*)    MCV 78.1 (*)    MCH 24.1 (*)    RDW 16.7 (*)    All other components within normal limits  COMPREHENSIVE METABOLIC PANEL WITH GFR - Abnormal; Notable for the following components:   Potassium 3.1 (*)    Glucose, Bld 197 (*)    Calcium 8.8 (*)    Total Protein 6.4 (*)    Albumin  2.9 (*)    All other components within normal limits  CBC - Abnormal; Notable for the following components:   Hemoglobin 11.7 (*)    MCV 75.6 (*)    MCH 24.4 (*)    RDW 16.5 (*)    All other  components within normal limits  RESP PANEL BY RT-PCR (RSV, FLU A&B, COVID)  [id number]  LIPASE, BLOOD  URINALYSIS, ROUTINE W REFLEX MICROSCOPIC  TYPE AND SCREEN  TROPONIN I (HIGH SENSITIVITY)  TROPONIN I (HIGH SENSITIVITY)    EKG: None  Radiology: DG Chest 2 View Result Date: 07/18/2024 EXAM: 2 VIEW(S) XRAY OF THE CHEST 07/18/2024 03:58:00 AM COMPARISON: 08/10/2023 CLINICAL HISTORY: CP. Body aches, shortness of breath FINDINGS: LUNGS AND PLEURA: Hazy opacification overlying the lower thoracic spine on the lateral radiograph, suggesting airspace disease in 1 of the lung bases. The upper lung zones are clear. No pleural effusion. No pneumothorax. HEART AND MEDIASTINUM: No acute abnormality of the cardiac and mediastinal silhouettes. BONES AND SOFT TISSUES: No acute osseous abnormality. The study is limited by large body habitus. IMPRESSION: 1. Hazy opacification overlying the lower thoracic spine on the lateral radiograph, suggesting airspace disease in one of the lung bases. 2. Study limited by large body habitus. Electronically signed by: Evalene Coho MD 07/18/2024 04:13 AM EDT RP Workstation: [id number]     Procedures   Medications Ordered in the ED  iohexol  (OMNIPAQUE ) 350 MG/ML injection 100 mL (has no administration in time range)  ondansetron  (ZOFRAN ) injection 4 mg (4 mg Intravenous Given 07/18/24 0437)  fentaNYL  (SUBLIMAZE ) injection 50 mcg (50 mcg Intravenous Given 07/18/24 0435)  fentaNYL  (SUBLIMAZE ) injection 100 mcg (100 mcg Intravenous Given 07/18/24 0525)                                    Medical Decision Making Amount and/or Complexity of Data Reviewed Labs: ordered. Radiology: ordered.  Risk Prescription drug management.   This patient presents to the ED for concern of chest/back pain, nausea, vomiting, cough, dizziness, this involves an extensive number of treatment options, and is a complaint that carries with it a high risk of complications and morbidity.  The  differential diagnosis includes anemia, electrolyte abnormality, STEMI, NSTEMI, arrhythmia, COVID, flu, RSV, spinal mass, musculoskeletal pain, pneumonia   Co morbidities / Chronic conditions that complicate the patient evaluation  Diabetes, paralysis, and hypertension   Additional history obtained:  Additional history obtained from EMR External records from outside source obtained and reviewed including pulmonology notes   Lab Tests:  I Ordered, and personally interpreted labs.  The pertinent results include: Anemia at 6.6 likely inaccurate as this was repeated and found to be 11.7 which is closer to the patient's baseline, hypokalemia 3.1, troponin 7, respiratory panel negative   Imaging Studies ordered:  I ordered imaging studies including chest x-ray I independently visualized and interpreted imaging which showed hazy opacification overlying  the lower thoracic spine on lateral radiograph, suggesting airspace disease in one of the lung bases.  Study limited secondary to body habitus. I agree with the radiologist interpretation  Cardiac Monitoring: / EKG:  The patient was maintained on a cardiac monitor.  I personally viewed and interpreted the cardiac monitored which showed an underlying rhythm of: Sinus rhythm   Problem List / ED Course / Critical interventions / Medication management I ordered medication including Zofran  and fentanyl  I have reviewed the patients home medicines and have made adjustments as needed  6:28 AM Care of Oluwanifemi Petitti transferred to [facility] at the end of my shift as the patient will require reassessment once labs/imaging have resulted. Patient presentation, ED course, and plan of care discussed with review of all pertinent labs and imaging. Please see his/her note for further details regarding further ED course and disposition. Plan at time of handoff is CT imaging and likely discharge with symptomatic management. This may be altered or  completely changed at the discretion of the oncoming team pending results of further workup.      Final diagnoses:  Acute anemia    ED Discharge Orders     None          Francis Ileana LOISE DEVONNA 07/18/24 9371    Melvenia Motto, MD 07/18/24 [phone]
